# Patient Record
Sex: Female | Born: 1940 | Race: White | Hispanic: No | Smoking: Never smoker
Health system: Southern US, Community
[De-identification: ages and names within clinical notes are randomized; demographics above are authoritative.]

## PROBLEM LIST (undated history)

## (undated) DIAGNOSIS — K635 Polyp of colon: Secondary | ICD-10-CM

## (undated) DIAGNOSIS — I779 Disorder of arteries and arterioles, unspecified: Secondary | ICD-10-CM

## (undated) DIAGNOSIS — M5030 Other cervical disc degeneration, unspecified cervical region: Secondary | ICD-10-CM

## (undated) DIAGNOSIS — M51369 Other intervertebral disc degeneration, lumbar region without mention of lumbar back pain or lower extremity pain: Secondary | ICD-10-CM

## (undated) DIAGNOSIS — I5189 Other ill-defined heart diseases: Secondary | ICD-10-CM

## (undated) DIAGNOSIS — M1990 Unspecified osteoarthritis, unspecified site: Secondary | ICD-10-CM

## (undated) DIAGNOSIS — K429 Umbilical hernia without obstruction or gangrene: Secondary | ICD-10-CM

## (undated) DIAGNOSIS — I700 Atherosclerosis of aorta: Secondary | ICD-10-CM

## (undated) DIAGNOSIS — I350 Nonrheumatic aortic (valve) stenosis: Secondary | ICD-10-CM

## (undated) DIAGNOSIS — C44519 Basal cell carcinoma of skin of other part of trunk: Secondary | ICD-10-CM

## (undated) DIAGNOSIS — E039 Hypothyroidism, unspecified: Secondary | ICD-10-CM

## (undated) DIAGNOSIS — M8580 Other specified disorders of bone density and structure, unspecified site: Secondary | ICD-10-CM

## (undated) DIAGNOSIS — D649 Anemia, unspecified: Secondary | ICD-10-CM

## (undated) DIAGNOSIS — I517 Cardiomegaly: Secondary | ICD-10-CM

## (undated) DIAGNOSIS — Z9221 Personal history of antineoplastic chemotherapy: Secondary | ICD-10-CM

## (undated) DIAGNOSIS — I2510 Atherosclerotic heart disease of native coronary artery without angina pectoris: Secondary | ICD-10-CM

## (undated) DIAGNOSIS — M47816 Spondylosis without myelopathy or radiculopathy, lumbar region: Secondary | ICD-10-CM

## (undated) DIAGNOSIS — R600 Localized edema: Secondary | ICD-10-CM

## (undated) DIAGNOSIS — G629 Polyneuropathy, unspecified: Secondary | ICD-10-CM

## (undated) DIAGNOSIS — Z7982 Long term (current) use of aspirin: Secondary | ICD-10-CM

## (undated) DIAGNOSIS — I10 Essential (primary) hypertension: Secondary | ICD-10-CM

## (undated) DIAGNOSIS — Z95828 Presence of other vascular implants and grafts: Secondary | ICD-10-CM

## (undated) DIAGNOSIS — R112 Nausea with vomiting, unspecified: Secondary | ICD-10-CM

## (undated) DIAGNOSIS — Z950 Presence of cardiac pacemaker: Secondary | ICD-10-CM

## (undated) DIAGNOSIS — M47812 Spondylosis without myelopathy or radiculopathy, cervical region: Secondary | ICD-10-CM

## (undated) DIAGNOSIS — R011 Cardiac murmur, unspecified: Secondary | ICD-10-CM

## (undated) DIAGNOSIS — T451X5A Adverse effect of antineoplastic and immunosuppressive drugs, initial encounter: Secondary | ICD-10-CM

## (undated) DIAGNOSIS — Z9841 Cataract extraction status, right eye: Secondary | ICD-10-CM

## (undated) DIAGNOSIS — S42309A Unspecified fracture of shaft of humerus, unspecified arm, initial encounter for closed fracture: Secondary | ICD-10-CM

## (undated) DIAGNOSIS — Z9889 Other specified postprocedural states: Secondary | ICD-10-CM

## (undated) DIAGNOSIS — C801 Malignant (primary) neoplasm, unspecified: Secondary | ICD-10-CM

## (undated) DIAGNOSIS — M4802 Spinal stenosis, cervical region: Secondary | ICD-10-CM

## (undated) DIAGNOSIS — E0590 Thyrotoxicosis, unspecified without thyrotoxic crisis or storm: Secondary | ICD-10-CM

## (undated) DIAGNOSIS — C50919 Malignant neoplasm of unspecified site of unspecified female breast: Secondary | ICD-10-CM

## (undated) DIAGNOSIS — C4491 Basal cell carcinoma of skin, unspecified: Secondary | ICD-10-CM

## (undated) DIAGNOSIS — Z9842 Cataract extraction status, left eye: Secondary | ICD-10-CM

## (undated) DIAGNOSIS — Z923 Personal history of irradiation: Secondary | ICD-10-CM

## (undated) HISTORY — PX: INSERT / REPLACE / REMOVE PACEMAKER: SUR710

## (undated) HISTORY — DX: Adverse effect of antineoplastic and immunosuppressive drugs, initial encounter: R11.2

## (undated) HISTORY — DX: Other specified disorders of bone density and structure, unspecified site: M85.80

## (undated) HISTORY — PX: BREAST EXCISIONAL BIOPSY: SUR124

## (undated) HISTORY — DX: Presence of other vascular implants and grafts: Z95.828

## (undated) HISTORY — DX: Basal cell carcinoma of skin, unspecified: C44.91

## (undated) HISTORY — PX: BREAST SURGERY: SHX581

## (undated) HISTORY — DX: Adverse effect of antineoplastic and immunosuppressive drugs, initial encounter: T45.1X5A

## (undated) HISTORY — DX: Malignant (primary) neoplasm, unspecified: C80.1

## (undated) HISTORY — PX: CARDIAC CATHETERIZATION: SHX172

## (undated) HISTORY — PX: CHOLECYSTECTOMY: SHX55

## (undated) HISTORY — PX: BACK SURGERY: SHX140

## (undated) HISTORY — PX: EYE SURGERY: SHX253

## (undated) HISTORY — DX: Essential (primary) hypertension: I10

## (undated) HISTORY — DX: Nausea with vomiting, unspecified: R11.2

## (undated) HISTORY — PX: CARDIAC VALVE REPLACEMENT: SHX585

## (undated) HISTORY — DX: Presence of cardiac pacemaker: Z95.0

## (undated) HISTORY — PX: HEMORRHOID SURGERY: SHX153

## (undated) HISTORY — PX: CATARACT EXTRACTION W/ INTRAOCULAR LENS  IMPLANT, BILATERAL: SHX1307

---
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
## 1999-12-05 ENCOUNTER — Other Ambulatory Visit: Admission: RE | Admit: 1999-12-05 | Discharge: 1999-12-05 | Payer: Self-pay | Admitting: Podiatry

## 2004-02-02 ENCOUNTER — Emergency Department (HOSPITAL_COMMUNITY): Admission: EM | Admit: 2004-02-02 | Discharge: 2004-02-02 | Payer: Self-pay | Admitting: Family Medicine

## 2008-06-18 DIAGNOSIS — C50919 Malignant neoplasm of unspecified site of unspecified female breast: Secondary | ICD-10-CM

## 2008-06-18 DIAGNOSIS — Z9221 Personal history of antineoplastic chemotherapy: Secondary | ICD-10-CM

## 2008-06-18 DIAGNOSIS — Z923 Personal history of irradiation: Secondary | ICD-10-CM

## 2008-06-18 HISTORY — PX: MASTECTOMY, PARTIAL: SHX709

## 2008-06-18 HISTORY — DX: Malignant neoplasm of unspecified site of unspecified female breast: C50.919

## 2008-06-18 HISTORY — DX: Personal history of irradiation: Z92.3

## 2008-06-18 HISTORY — DX: Personal history of antineoplastic chemotherapy: Z92.21

## 2009-03-10 ENCOUNTER — Ambulatory Visit: Payer: Self-pay | Admitting: Surgery

## 2009-03-10 IMAGING — US US  BREAST BX W/ LOC DEV 1ST LESION IMG BX SPEC US GUIDE*R*
1 series · 17 of 25 positions shown · non-contrast
Comparison: No comparison.

REASON FOR EXAM: rt breast mass
COMMENTS:

PROCEDURE:     US  - US GUIDED BIOPSY BREAST RIGHT  - [DATE]  [DATE]
RESULT:     Ultrasound Indication: Spiculated right breast mass.

[Series 1: us breast bx w/ loc dev 1st lesion img bx spec us  · 17 of 26 slices shown]
[im 1/26]
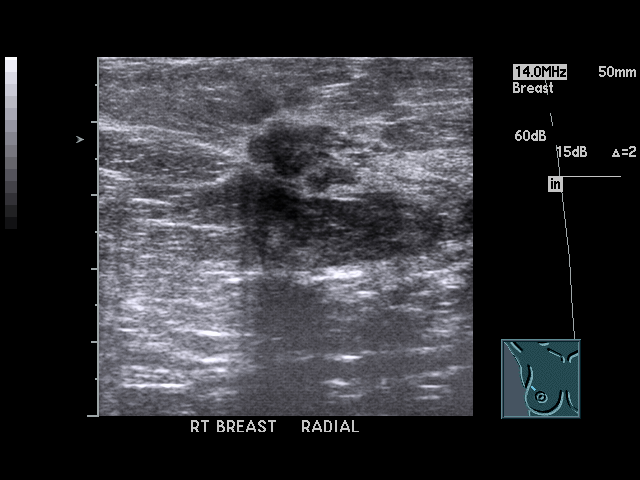
[im 3/26]
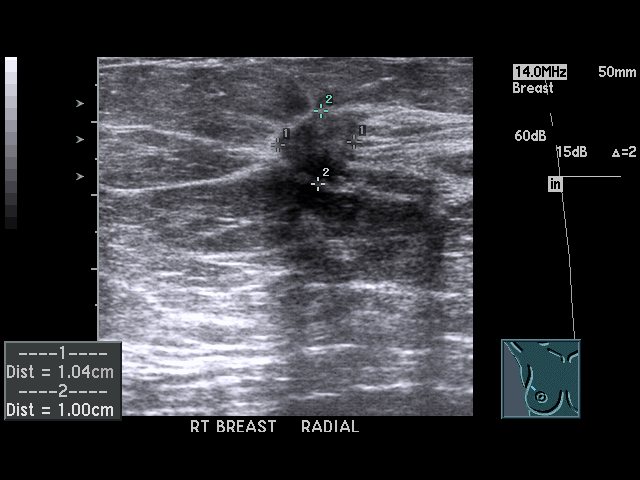
[im 4/26]
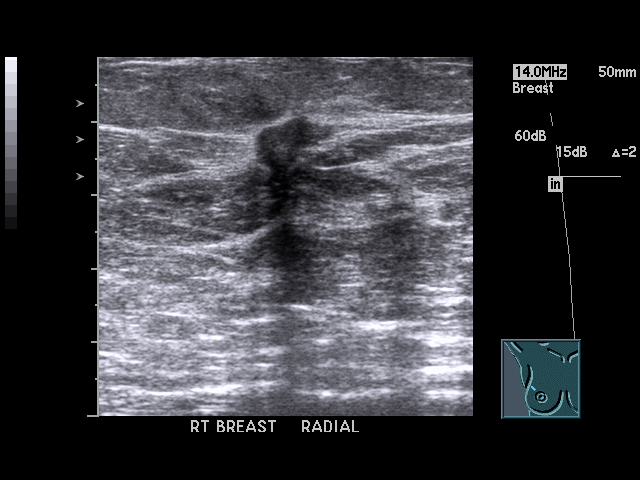
[im 6/26]
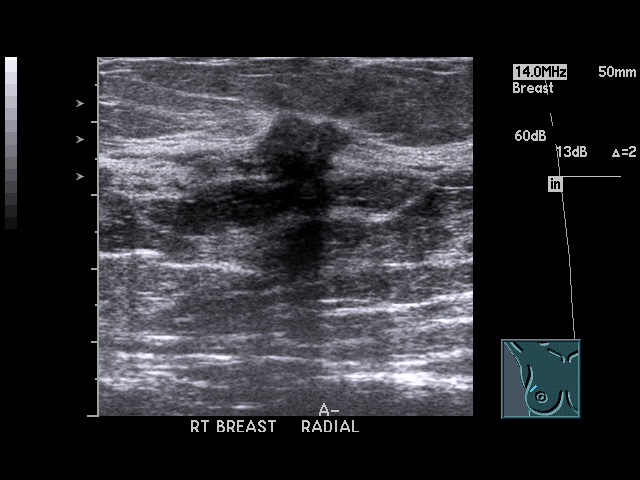
[im 7/26]
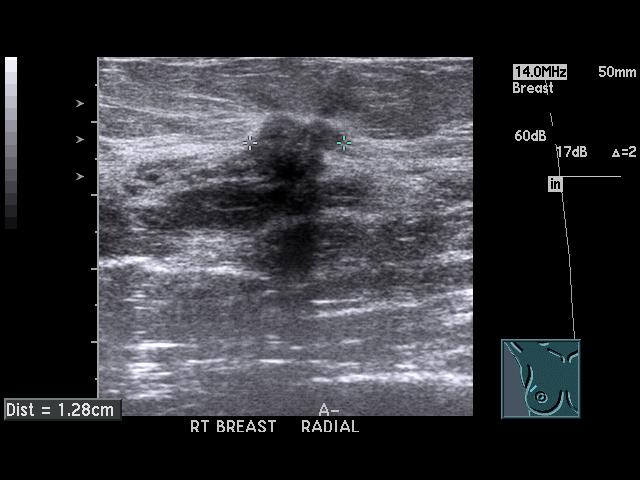
[im 9/26]
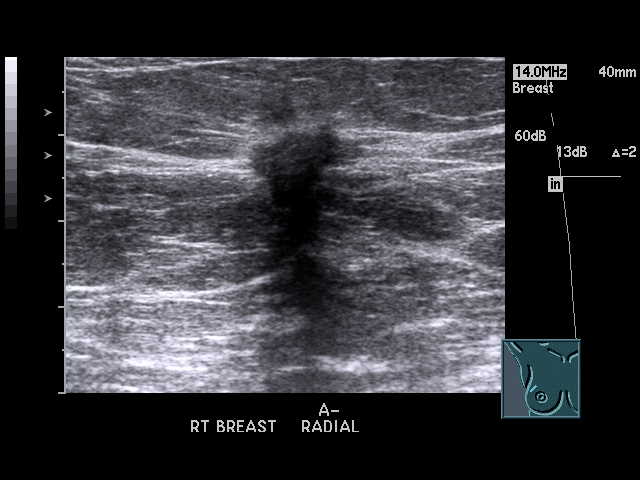
[im 10/26]
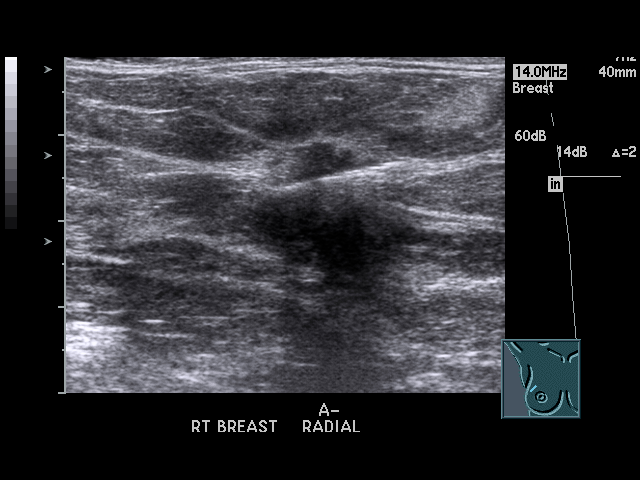
[im 12/26]
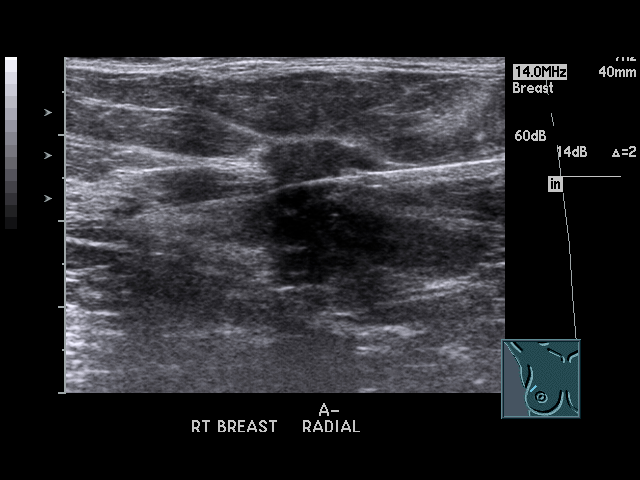
[im 13/26]
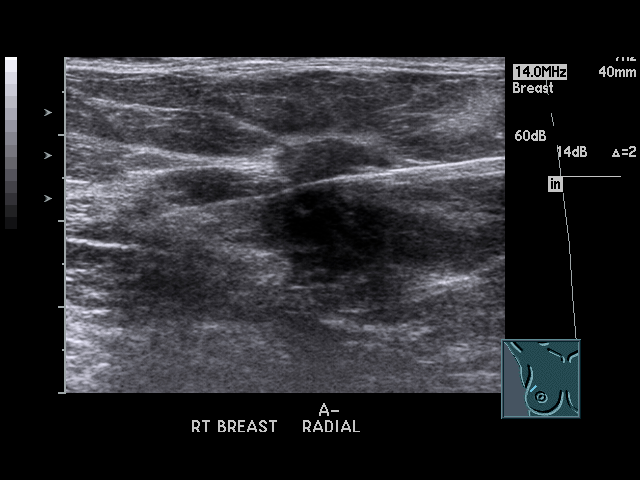
[im 14/26]
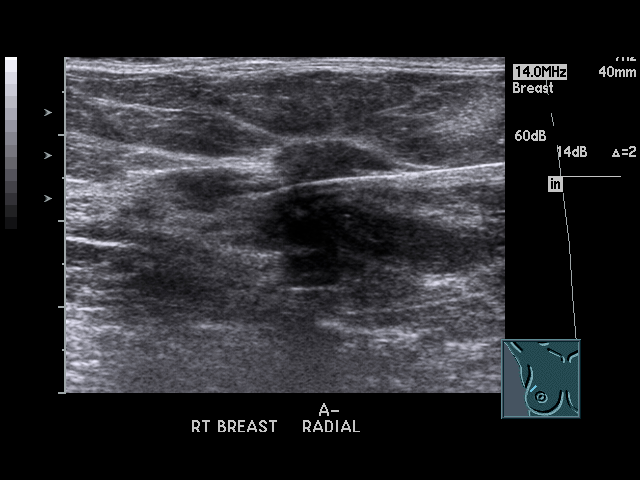
[im 16/26]
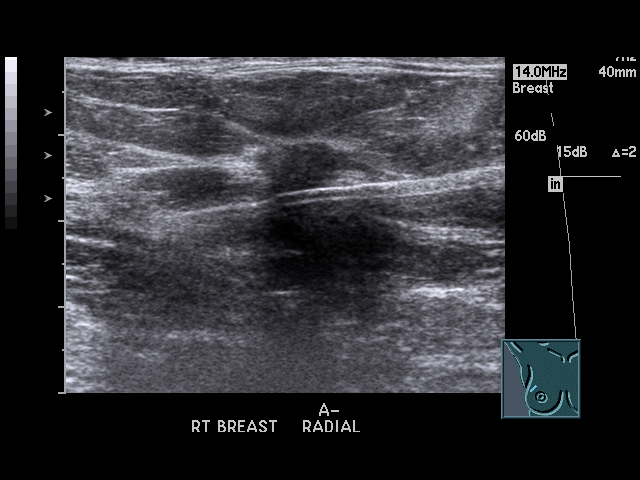
[im 17/26]
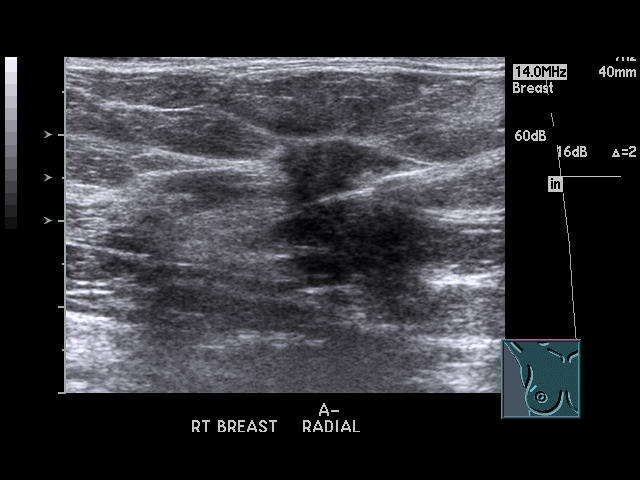
[im 19/26]
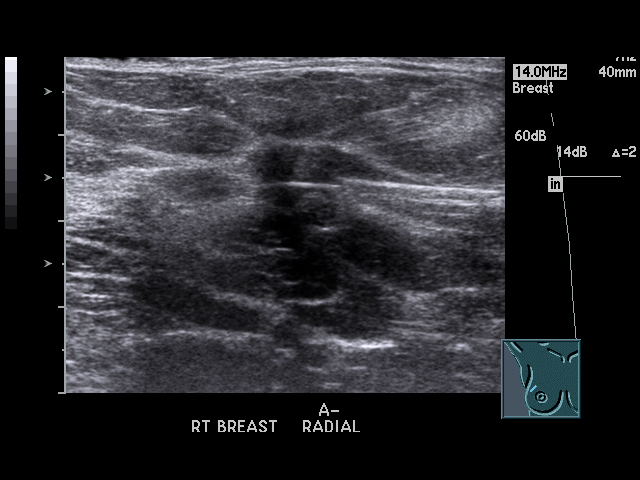
[im 20/26]
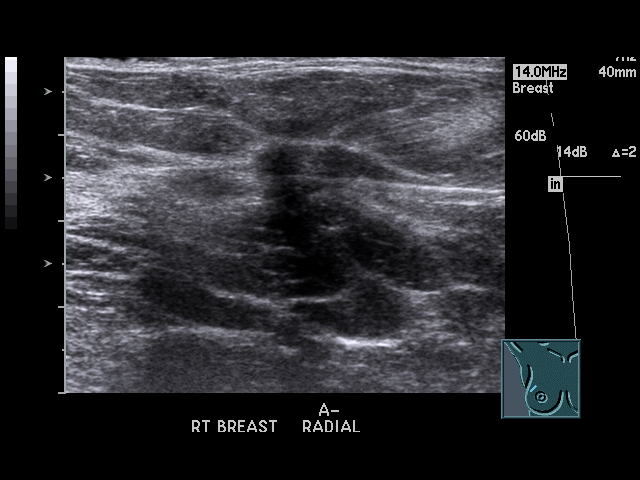
[im 22/26]
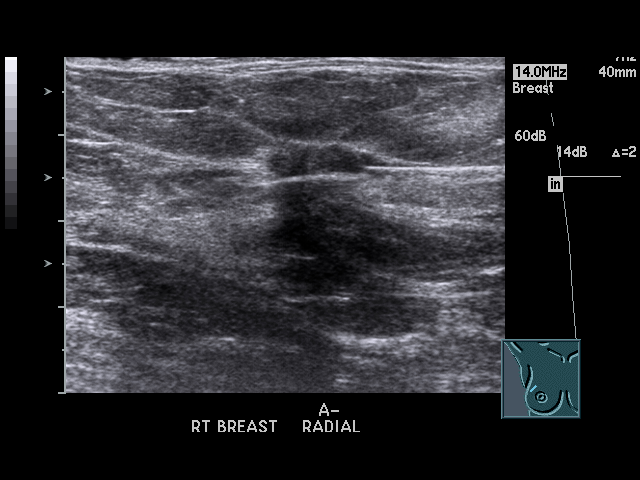
[im 23/26]
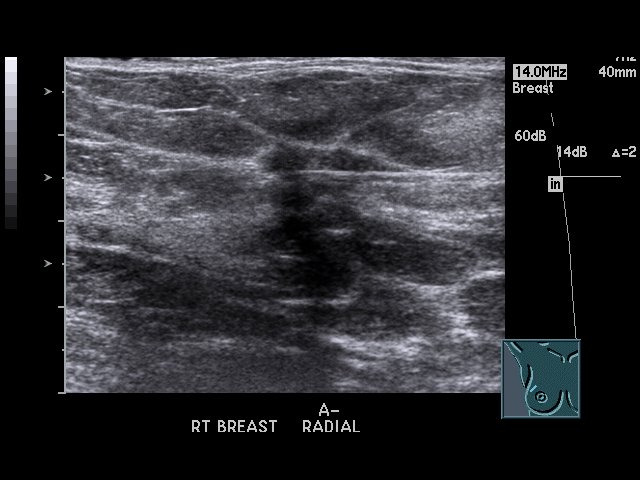
[im 26/26]
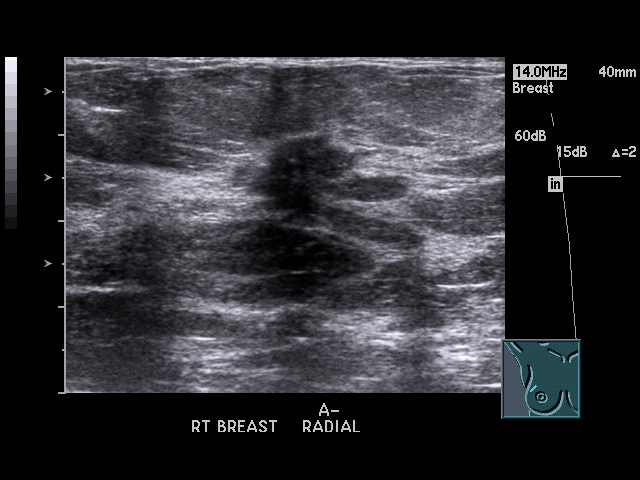

[17 of 25 positions shown; findings below may reference images not displayed]

Consent: After the procedure and its risks including infection, bleeding,
and allergy were discussed with the patient and all questions answered,
informed consent was signed.

Procedure: The patient was placed supine on the ultrasound with the right
arm in ABER position. A "timeout" was performed to verify the correct
patient and correct procedure. Initial ultrasound of the right breast mass
demonstrated a spiculated hypoechoic mass at the [DATE] position with
posterior shadowing. The patient was then prepped and draped in the usual
sterile fashion. Realtime ultrasound was used to localize the mass and a
biopsy path was selected. The skin was marked and 1% lidocaine was
infiltrated into skin and soft tissues along the biopsy tract up to the mass
for local anesthesia.

A small skin incision with a #11 blade was made at the skin entry site. A 17
gauge coaxial needle was advanced to the margin of the mass. Subsequently, 6
core biopsy specimens were obtained using an Achieve 18 gauge needle in
semiautomatic mode. Needle passes into the mass were observed under realtime
and recorded. Seven core biopsy samples were obtained and submitted to
pathology for evaluation. The needle was removed, hemostasis achieved and a
bandaid applied to the skin incision. The patient tolerated the procedure
well without immediate complication and was discharged in good condition
after 15 min observation.
IMPRESSION: Successful core needle biopsy of a right breast mass.

## 2009-03-18 ENCOUNTER — Ambulatory Visit: Payer: Self-pay | Admitting: Internal Medicine

## 2009-03-18 ENCOUNTER — Ambulatory Visit: Payer: Self-pay | Admitting: Surgery

## 2009-03-18 HISTORY — PX: BREAST EXCISIONAL BIOPSY: SUR124

## 2009-03-18 IMAGING — CR DG CHEST 2V
1 series · 2 of 2 positions shown · non-contrast
Comparison: none

REASON FOR EXAM: [DATE]-----HTN
COMMENTS:

PROCEDURE:     DXR - DXR CHEST PA (OR AP) AND LATERAL  - [DATE]  [DATE]
RESULT:     The lungs are clear. The cardiac silhouette and visualized bony
skeleton are unremarkable.

[Series 1: view not recorded · 0.17mm/px · 2 of 2 slices shown]
[im 1/2]
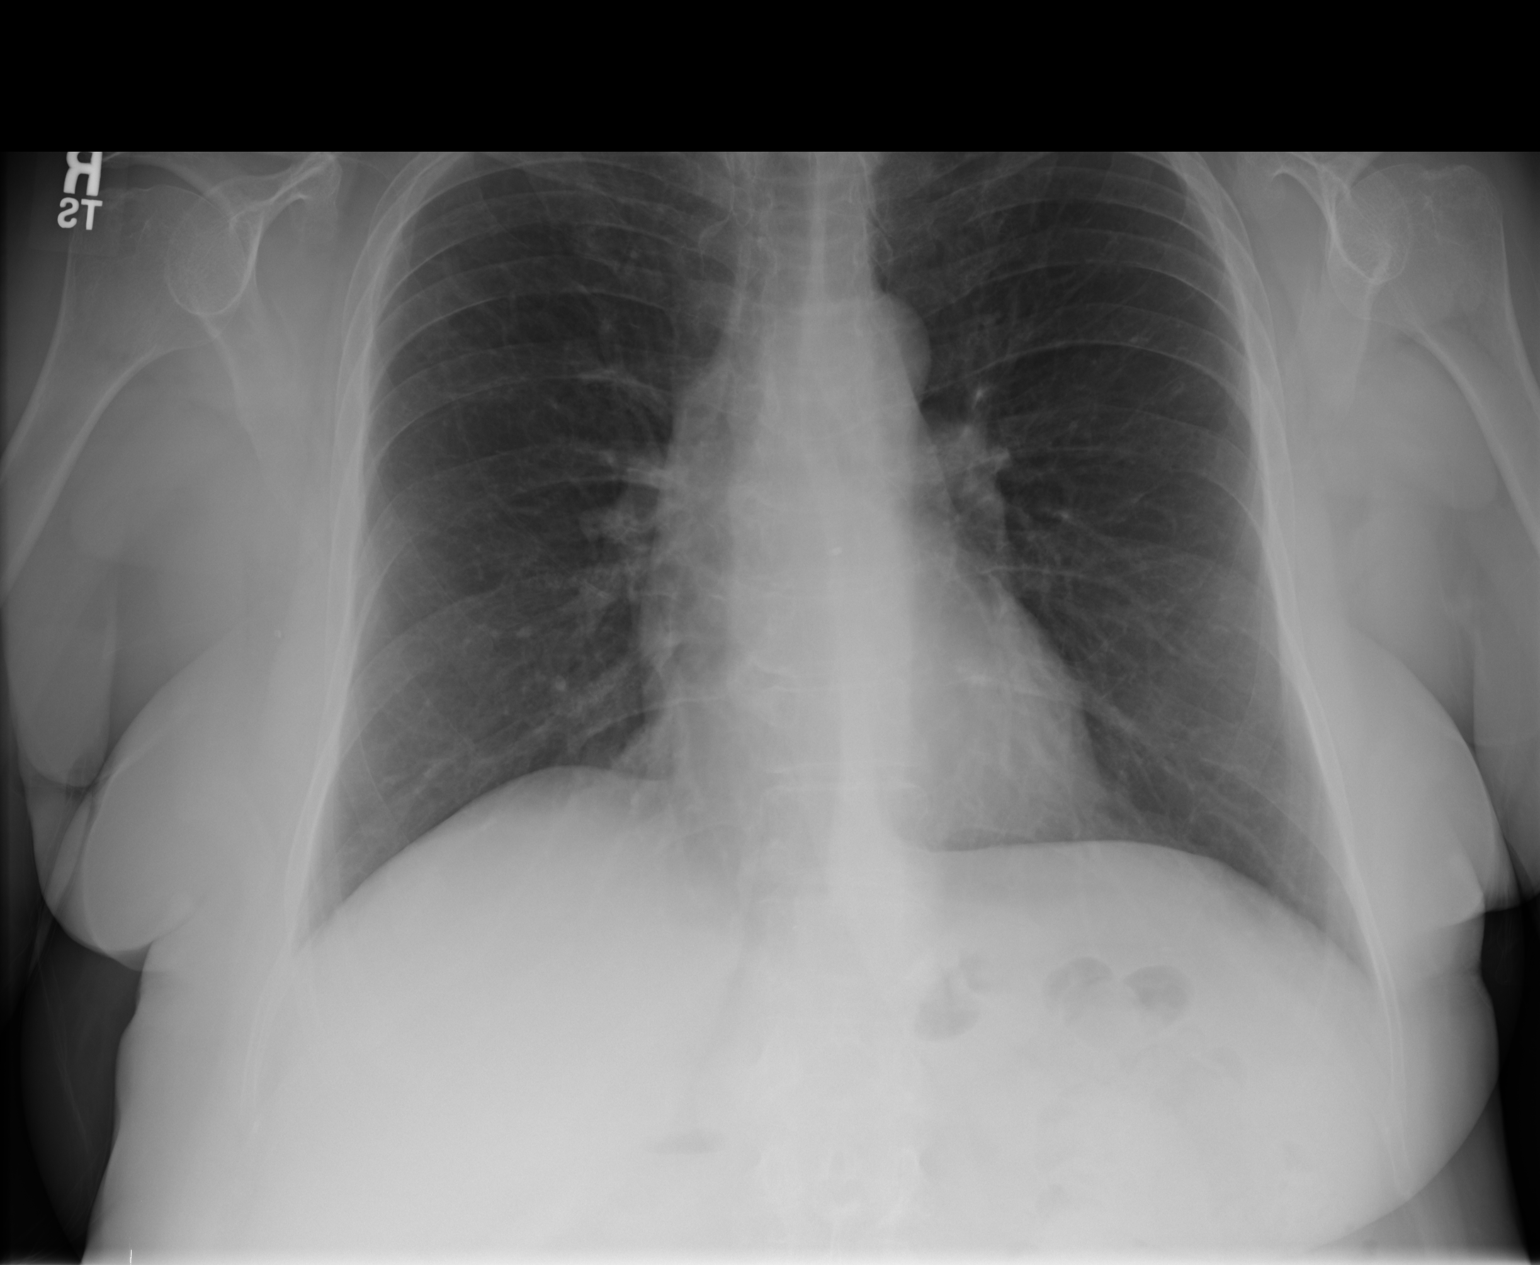
[im 2/2]
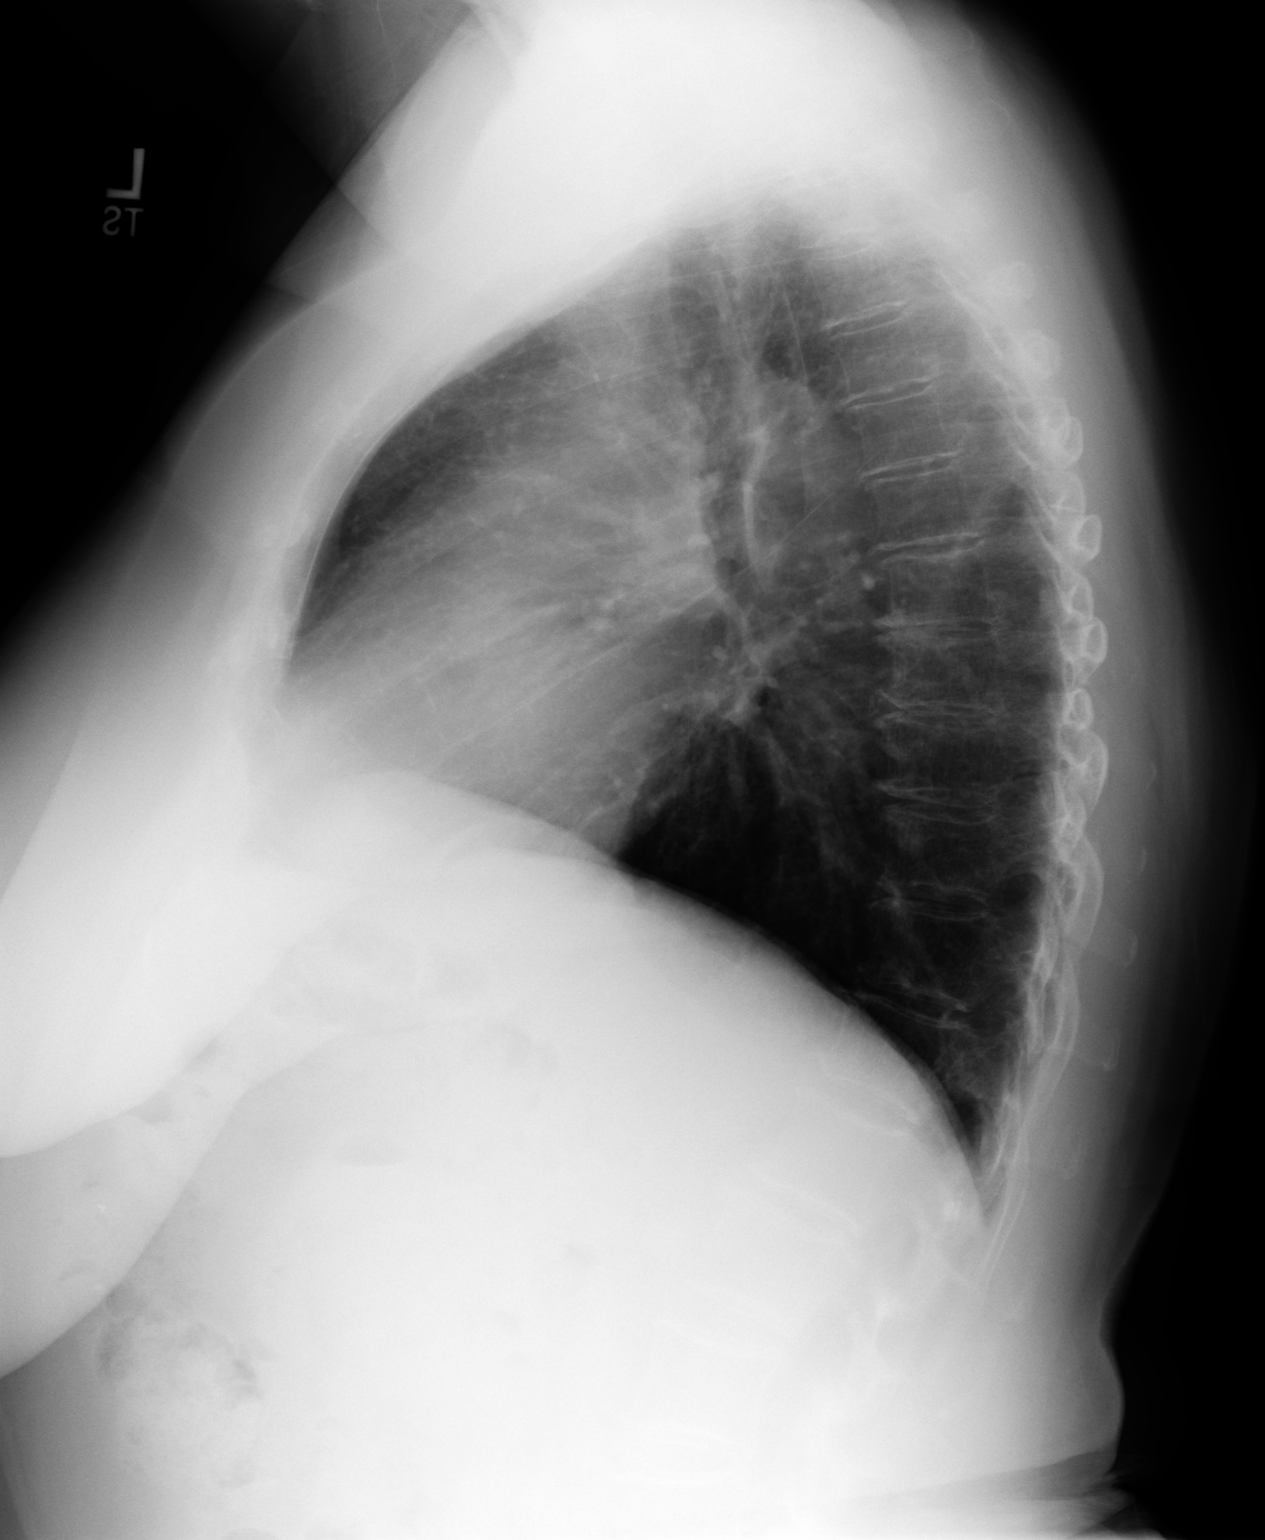

[2 of 2 positions shown; findings below may reference images not displayed]

IMPRESSION: 1. Chest radiograph without evidence of acute cardiopulmonary disease.

## 2009-03-23 ENCOUNTER — Ambulatory Visit: Payer: Self-pay | Admitting: Surgery

## 2009-03-23 IMAGING — NM NM SENTINAL NODE INJECTION (BREAST) - NO REPORT
2 series · 18 of 18 positions shown · non-contrast
Comparison: none

REASON FOR EXAM: R PARTIAL MASTECTOMY SEO BX   NEEDLE LOC  POSS AXILLARY
DISSECTION  [DATE]   ...
COMMENTS:

PROCEDURE:     NM  - NM SENTINEL NODE  BREAST  - [DATE] [DATE]
RESULT:
HISTORY: Breast cancer.
PROCEDURE AND FINDINGS: Following discussion of this procedure with the
patient, the right breast was sterilely prepped and draped. Following local
anesthesia with 1% lidocaine, 1.03 mCi of technetium sulfur colloid was
injected into the subcutaneous space in the right periareolar region.

[Series 1000: sent node breast static · 2.40mm/px · 6 acquisitions, 12 frames shown]
[im 1/6]
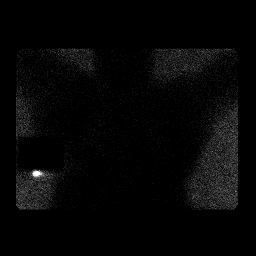
[im 1/6]
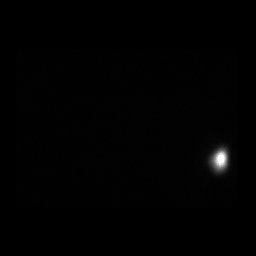
[im 2/6]
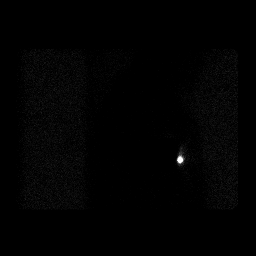
[im 2/6]
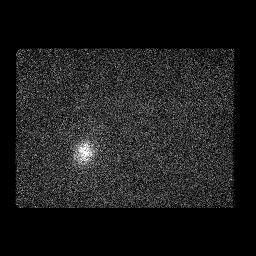
[im 3/6]
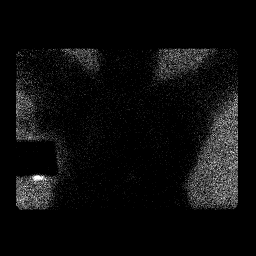
[im 3/6]
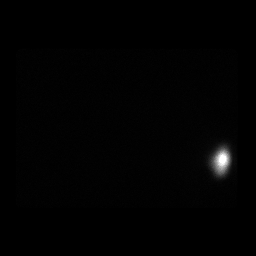
[im 4/6]
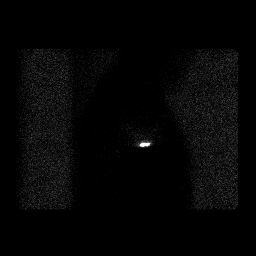
[im 4/6]
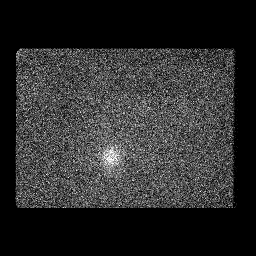
[im 5/6]
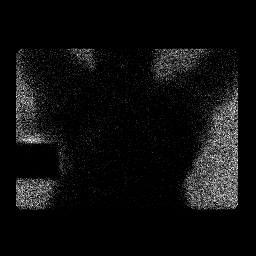
[im 5/6]
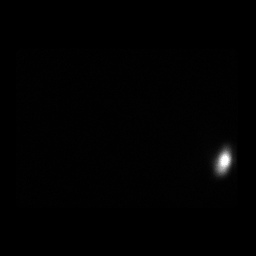
[im 6/6  full-range]
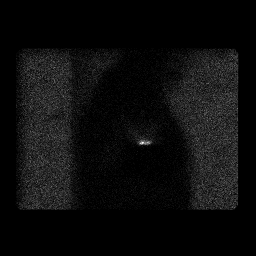
[im 6/6  full-range]
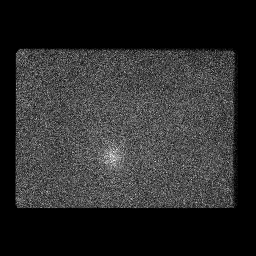

[Series 1000: sent node breast-dynamic · 4.80mm/px · 6 of 60 frames shown]
[frame 6/60  full-range]
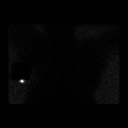
[frame 16/60  full-range]
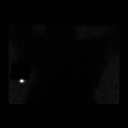
[frame 26/60  full-range]
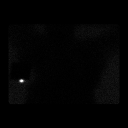
[frame 36/60]
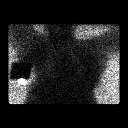
[frame 46/60]
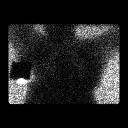
[frame 56/60]
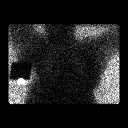

[18 of 18 positions shown; findings below may reference images not displayed]

IMPRESSION: Successful injection for sentinel node study. No definite
sentinel node noted up to 15 minutes.

## 2009-03-30 ENCOUNTER — Emergency Department: Payer: Self-pay | Admitting: Surgery

## 2009-04-06 ENCOUNTER — Ambulatory Visit: Payer: Self-pay | Admitting: Internal Medicine

## 2009-04-18 ENCOUNTER — Ambulatory Visit: Payer: Self-pay | Admitting: Internal Medicine

## 2009-05-06 ENCOUNTER — Ambulatory Visit: Payer: Self-pay | Admitting: Surgery

## 2009-05-06 IMAGING — CR DG CHEST 1V PORT
1 series · 1 of 1 positions shown · non-contrast
Comparison: none

REASON FOR EXAM: check catheter placement
COMMENTS:

[view not recorded]
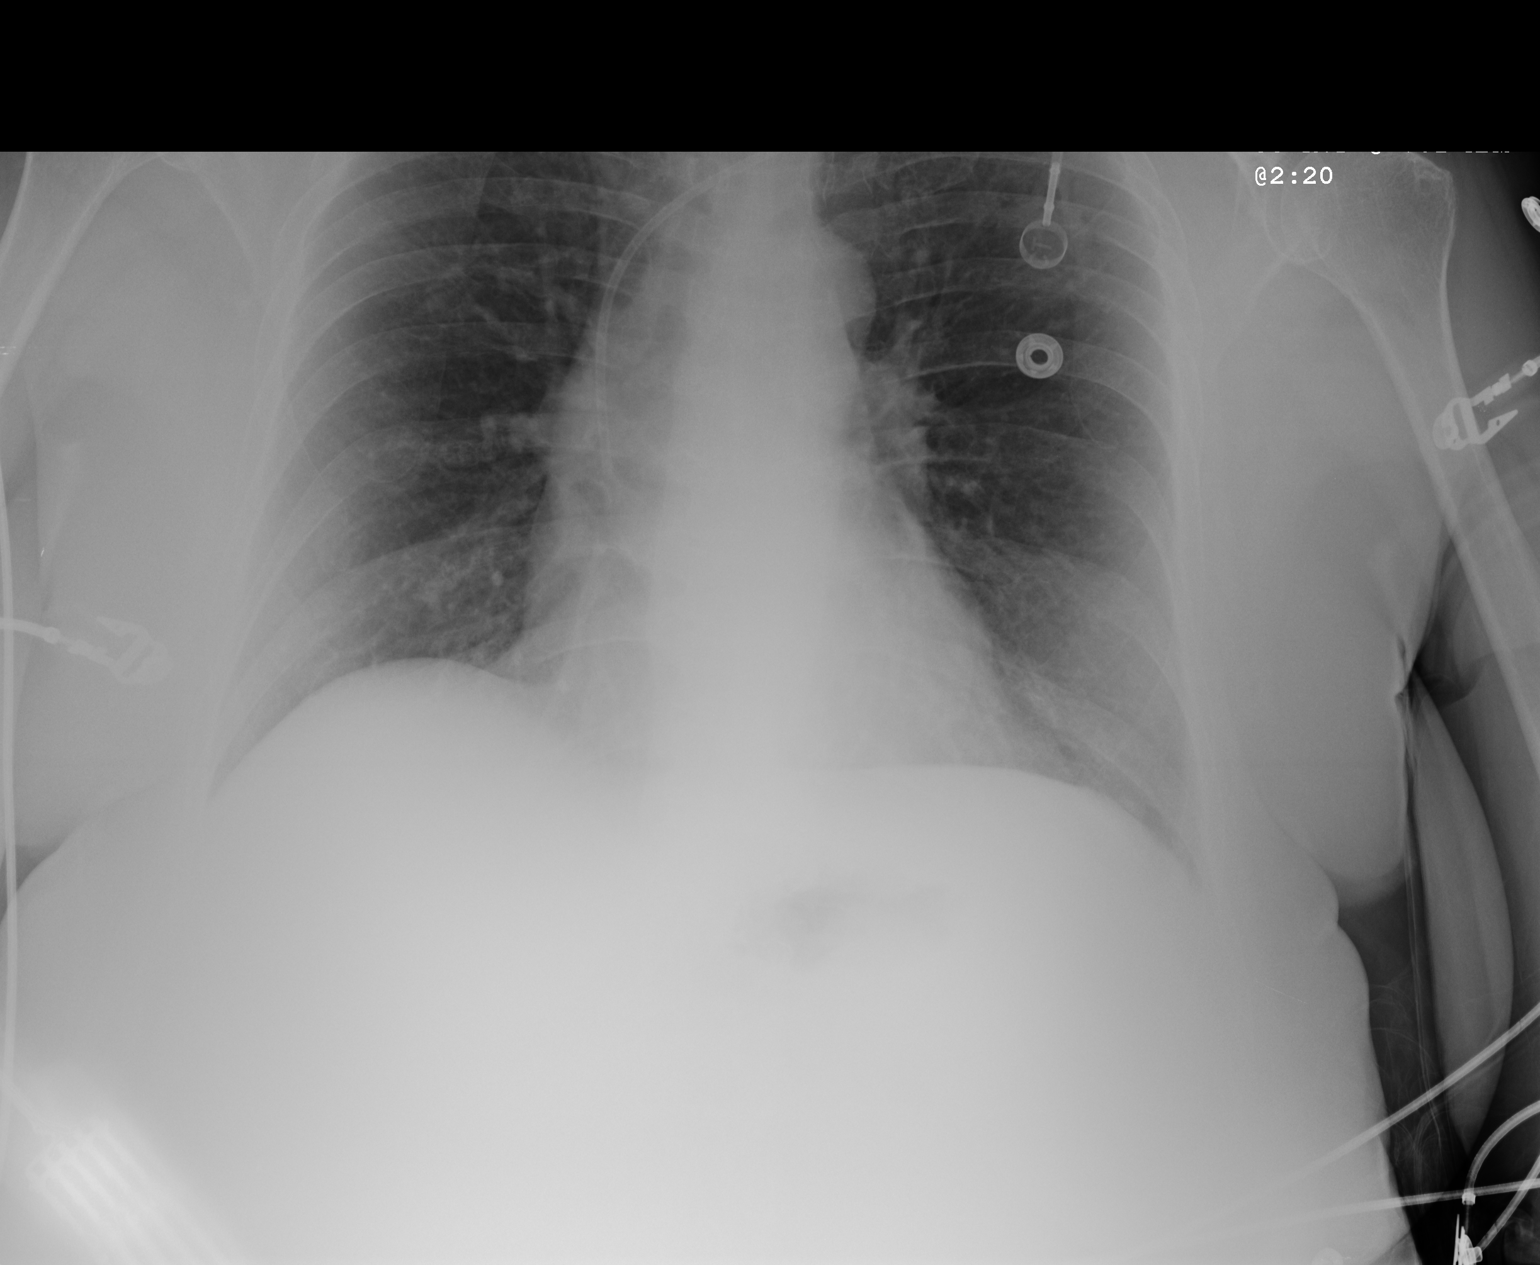

[1 of 1 positions shown; findings below may reference images not displayed]

PROCEDURE:     DXR - DXR PORTABLE CHEST SINGLE VIEW  - [DATE]  [DATE]

RESULT:     Comparison is made to the study [DATE].

The lungs are reasonably well inflated. The patient has undergone placement
of a Port-A-Cath appliance via the left subclavian approach. The tip of the
catheter lies in the region of the midportion of the SVC. There is no
evidence of a post procedure pneumothorax or hemothorax. The cardiac
silhouette is normal in size.
IMPRESSION: I see no post procedure complication following placement of
a Port-A-Cath appliance on the left.

## 2009-05-10 ENCOUNTER — Ambulatory Visit: Payer: Self-pay | Admitting: Internal Medicine

## 2009-05-18 ENCOUNTER — Ambulatory Visit: Payer: Self-pay | Admitting: Internal Medicine

## 2009-06-18 ENCOUNTER — Ambulatory Visit: Payer: Self-pay | Admitting: Internal Medicine

## 2009-06-18 DIAGNOSIS — C50911 Malignant neoplasm of unspecified site of right female breast: Secondary | ICD-10-CM

## 2009-06-18 HISTORY — DX: Malignant neoplasm of unspecified site of right female breast: C50.911

## 2009-07-19 ENCOUNTER — Ambulatory Visit: Payer: Self-pay | Admitting: Internal Medicine

## 2009-08-16 ENCOUNTER — Ambulatory Visit: Payer: Self-pay | Admitting: Internal Medicine

## 2009-09-16 ENCOUNTER — Ambulatory Visit: Payer: Self-pay | Admitting: Internal Medicine

## 2009-10-16 ENCOUNTER — Ambulatory Visit: Payer: Self-pay | Admitting: Internal Medicine

## 2009-11-16 ENCOUNTER — Ambulatory Visit: Payer: Self-pay | Admitting: Internal Medicine

## 2009-12-16 ENCOUNTER — Ambulatory Visit: Payer: Self-pay | Admitting: Internal Medicine

## 2010-03-18 ENCOUNTER — Ambulatory Visit: Payer: Self-pay | Admitting: Internal Medicine

## 2010-03-21 ENCOUNTER — Ambulatory Visit: Payer: Self-pay | Admitting: Internal Medicine

## 2010-03-22 LAB — CANCER ANTIGEN 27.29: CA 27.29: 19.1 U/mL (ref 0.0–38.6)

## 2010-04-18 ENCOUNTER — Ambulatory Visit: Payer: Self-pay | Admitting: Internal Medicine

## 2010-05-18 ENCOUNTER — Ambulatory Visit: Payer: Self-pay | Admitting: Internal Medicine

## 2010-07-25 ENCOUNTER — Ambulatory Visit: Payer: Self-pay | Admitting: Internal Medicine

## 2010-07-26 LAB — CANCER ANTIGEN 27.29: CA 27.29: 26.2 U/mL (ref 0.0–38.6)

## 2010-08-17 ENCOUNTER — Ambulatory Visit: Payer: Self-pay | Admitting: Internal Medicine

## 2010-10-18 ENCOUNTER — Ambulatory Visit: Payer: Self-pay | Admitting: Internal Medicine

## 2010-10-26 ENCOUNTER — Ambulatory Visit: Payer: Self-pay | Admitting: Internal Medicine

## 2010-11-17 ENCOUNTER — Ambulatory Visit: Payer: Self-pay | Admitting: Internal Medicine

## 2010-12-12 ENCOUNTER — Ambulatory Visit: Payer: Self-pay | Admitting: Surgery

## 2010-12-15 LAB — CANCER ANTIGEN 27.29: CA 27.29: 20.5 U/mL (ref 0.0–38.6)

## 2010-12-17 ENCOUNTER — Ambulatory Visit: Payer: Self-pay | Admitting: Internal Medicine

## 2011-04-17 ENCOUNTER — Ambulatory Visit: Payer: Self-pay | Admitting: Internal Medicine

## 2011-04-18 LAB — CANCER ANTIGEN 27.29: CA 27.29: 29.7 U/mL (ref 0.0–38.6)

## 2011-04-19 ENCOUNTER — Ambulatory Visit: Payer: Self-pay | Admitting: Internal Medicine

## 2011-06-14 ENCOUNTER — Ambulatory Visit: Payer: Self-pay | Admitting: Surgery

## 2011-07-12 DIAGNOSIS — I10 Essential (primary) hypertension: Secondary | ICD-10-CM | POA: Diagnosis not present

## 2011-07-12 DIAGNOSIS — I359 Nonrheumatic aortic valve disorder, unspecified: Secondary | ICD-10-CM | POA: Diagnosis not present

## 2011-07-12 DIAGNOSIS — E782 Mixed hyperlipidemia: Secondary | ICD-10-CM | POA: Diagnosis not present

## 2011-10-01 DIAGNOSIS — C50919 Malignant neoplasm of unspecified site of unspecified female breast: Secondary | ICD-10-CM | POA: Diagnosis not present

## 2011-10-16 ENCOUNTER — Ambulatory Visit: Payer: Self-pay | Admitting: Oncology

## 2011-10-16 DIAGNOSIS — Z79899 Other long term (current) drug therapy: Secondary | ICD-10-CM | POA: Diagnosis not present

## 2011-10-16 DIAGNOSIS — E785 Hyperlipidemia, unspecified: Secondary | ICD-10-CM | POA: Diagnosis not present

## 2011-10-16 DIAGNOSIS — Z79811 Long term (current) use of aromatase inhibitors: Secondary | ICD-10-CM | POA: Diagnosis not present

## 2011-10-16 DIAGNOSIS — M810 Age-related osteoporosis without current pathological fracture: Secondary | ICD-10-CM | POA: Diagnosis not present

## 2011-10-16 DIAGNOSIS — Z170 Estrogen receptor positive status [ER+]: Secondary | ICD-10-CM | POA: Diagnosis not present

## 2011-10-16 DIAGNOSIS — I10 Essential (primary) hypertension: Secondary | ICD-10-CM | POA: Diagnosis not present

## 2011-10-16 DIAGNOSIS — Z9010 Acquired absence of unspecified breast and nipple: Secondary | ICD-10-CM | POA: Diagnosis not present

## 2011-10-16 DIAGNOSIS — C50919 Malignant neoplasm of unspecified site of unspecified female breast: Secondary | ICD-10-CM | POA: Diagnosis not present

## 2011-10-16 LAB — CBC CANCER CENTER
Basophil #: 0.1 x10 3/mm (ref 0.0–0.1)
Basophil %: 0.8 %
Eosinophil #: 0.2 x10 3/mm (ref 0.0–0.7)
Eosinophil %: 3.6 %
HCT: 39 % (ref 35.0–47.0)
HGB: 13.2 g/dL (ref 12.0–16.0)
Lymphocyte #: 1.6 x10 3/mm (ref 1.0–3.6)
Lymphocyte %: 24.9 %
MCH: 33.1 pg (ref 26.0–34.0)
MCHC: 33.9 g/dL (ref 32.0–36.0)
MCV: 98 fL (ref 80–100)
Monocyte #: 0.6 x10 3/mm (ref 0.2–0.9)
Monocyte %: 8.5 %
Neutrophil #: 4.1 x10 3/mm (ref 1.4–6.5)
Neutrophil %: 62.2 %
Platelet: 197 x10 3/mm (ref 150–440)
RBC: 3.99 10*6/uL (ref 3.80–5.20)
RDW: 12.8 % (ref 11.5–14.5)
WBC: 6.6 x10 3/mm (ref 3.6–11.0)

## 2011-10-16 LAB — COMPREHENSIVE METABOLIC PANEL
Albumin: 3.9 g/dL (ref 3.4–5.0)
Alkaline Phosphatase: 90 U/L (ref 50–136)
Anion Gap: 8 (ref 7–16)
BUN: 13 mg/dL (ref 7–18)
Bilirubin,Total: 0.5 mg/dL (ref 0.2–1.0)
Calcium, Total: 9.2 mg/dL (ref 8.5–10.1)
Chloride: 107 mmol/L (ref 98–107)
Co2: 26 mmol/L (ref 21–32)
Creatinine: 0.61 mg/dL (ref 0.60–1.30)
EGFR (African American): >60
EGFR (Non-African Amer.): >60
Glucose: 111 mg/dL — ABNORMAL HIGH (ref 65–99)
Osmolality: 282 (ref 275–301)
Potassium: 3.8 mmol/L (ref 3.5–5.1)
SGOT(AST): 21 U/L (ref 15–37)
SGPT (ALT): 22 U/L
Sodium: 141 mmol/L (ref 136–145)
Total Protein: 7.3 g/dL (ref 6.4–8.2)

## 2011-10-17 ENCOUNTER — Ambulatory Visit: Payer: Self-pay | Admitting: Oncology

## 2011-10-17 DIAGNOSIS — C50919 Malignant neoplasm of unspecified site of unspecified female breast: Secondary | ICD-10-CM | POA: Diagnosis not present

## 2011-10-17 DIAGNOSIS — Z79811 Long term (current) use of aromatase inhibitors: Secondary | ICD-10-CM | POA: Diagnosis not present

## 2011-10-17 DIAGNOSIS — Z13820 Encounter for screening for osteoporosis: Secondary | ICD-10-CM | POA: Diagnosis not present

## 2011-10-17 DIAGNOSIS — M810 Age-related osteoporosis without current pathological fracture: Secondary | ICD-10-CM | POA: Diagnosis not present

## 2011-10-17 LAB — CANCER ANTIGEN 27.29: CA 27.29: 16.1 U/mL (ref 0.0–38.6)

## 2011-11-06 DIAGNOSIS — Z13820 Encounter for screening for osteoporosis: Secondary | ICD-10-CM | POA: Diagnosis not present

## 2011-11-17 ENCOUNTER — Ambulatory Visit: Payer: Self-pay | Admitting: Oncology

## 2011-12-17 DIAGNOSIS — L918 Other hypertrophic disorders of the skin: Secondary | ICD-10-CM | POA: Diagnosis not present

## 2011-12-17 DIAGNOSIS — D485 Neoplasm of uncertain behavior of skin: Secondary | ICD-10-CM | POA: Diagnosis not present

## 2011-12-17 DIAGNOSIS — L570 Actinic keratosis: Secondary | ICD-10-CM | POA: Diagnosis not present

## 2011-12-17 DIAGNOSIS — L908 Other atrophic disorders of skin: Secondary | ICD-10-CM | POA: Diagnosis not present

## 2011-12-18 ENCOUNTER — Ambulatory Visit: Payer: Self-pay | Admitting: Surgery

## 2011-12-18 DIAGNOSIS — R928 Other abnormal and inconclusive findings on diagnostic imaging of breast: Secondary | ICD-10-CM | POA: Diagnosis not present

## 2011-12-18 DIAGNOSIS — Z853 Personal history of malignant neoplasm of breast: Secondary | ICD-10-CM | POA: Diagnosis not present

## 2012-02-15 ENCOUNTER — Ambulatory Visit: Payer: Self-pay | Admitting: Oncology

## 2012-02-15 DIAGNOSIS — C50919 Malignant neoplasm of unspecified site of unspecified female breast: Secondary | ICD-10-CM | POA: Diagnosis not present

## 2012-02-15 DIAGNOSIS — Z79899 Other long term (current) drug therapy: Secondary | ICD-10-CM | POA: Diagnosis not present

## 2012-02-15 DIAGNOSIS — Z7982 Long term (current) use of aspirin: Secondary | ICD-10-CM | POA: Diagnosis not present

## 2012-02-15 DIAGNOSIS — E785 Hyperlipidemia, unspecified: Secondary | ICD-10-CM | POA: Diagnosis not present

## 2012-02-15 DIAGNOSIS — I10 Essential (primary) hypertension: Secondary | ICD-10-CM | POA: Diagnosis not present

## 2012-02-15 DIAGNOSIS — Z79811 Long term (current) use of aromatase inhibitors: Secondary | ICD-10-CM | POA: Diagnosis not present

## 2012-02-15 DIAGNOSIS — Z9010 Acquired absence of unspecified breast and nipple: Secondary | ICD-10-CM | POA: Diagnosis not present

## 2012-02-15 DIAGNOSIS — M810 Age-related osteoporosis without current pathological fracture: Secondary | ICD-10-CM | POA: Diagnosis not present

## 2012-02-15 DIAGNOSIS — Z170 Estrogen receptor positive status [ER+]: Secondary | ICD-10-CM | POA: Diagnosis not present

## 2012-02-17 ENCOUNTER — Ambulatory Visit: Payer: Self-pay | Admitting: Oncology

## 2012-02-19 LAB — CANCER ANTIGEN 27.29: CA 27.29: 20.9 U/mL (ref 0.0–38.6)

## 2012-03-10 DIAGNOSIS — M899 Disorder of bone, unspecified: Secondary | ICD-10-CM | POA: Diagnosis not present

## 2012-03-10 DIAGNOSIS — Z1289 Encounter for screening for malignant neoplasm of other sites: Secondary | ICD-10-CM | POA: Diagnosis not present

## 2012-03-10 DIAGNOSIS — I10 Essential (primary) hypertension: Secondary | ICD-10-CM | POA: Diagnosis not present

## 2012-03-10 DIAGNOSIS — Z0000 Encounter for general adult medical examination without abnormal findings: Secondary | ICD-10-CM | POA: Diagnosis not present

## 2012-03-10 DIAGNOSIS — Z23 Encounter for immunization: Secondary | ICD-10-CM | POA: Diagnosis not present

## 2012-03-24 DIAGNOSIS — C50919 Malignant neoplasm of unspecified site of unspecified female breast: Secondary | ICD-10-CM | POA: Diagnosis not present

## 2012-04-08 DIAGNOSIS — H2510 Age-related nuclear cataract, unspecified eye: Secondary | ICD-10-CM | POA: Diagnosis not present

## 2012-07-15 DIAGNOSIS — I10 Essential (primary) hypertension: Secondary | ICD-10-CM | POA: Diagnosis not present

## 2012-07-15 DIAGNOSIS — I359 Nonrheumatic aortic valve disorder, unspecified: Secondary | ICD-10-CM | POA: Diagnosis not present

## 2012-08-13 ENCOUNTER — Ambulatory Visit: Payer: Self-pay | Admitting: Oncology

## 2012-08-13 DIAGNOSIS — M810 Age-related osteoporosis without current pathological fracture: Secondary | ICD-10-CM | POA: Diagnosis not present

## 2012-08-13 DIAGNOSIS — I10 Essential (primary) hypertension: Secondary | ICD-10-CM | POA: Diagnosis not present

## 2012-08-13 DIAGNOSIS — E785 Hyperlipidemia, unspecified: Secondary | ICD-10-CM | POA: Diagnosis not present

## 2012-08-13 DIAGNOSIS — Z79811 Long term (current) use of aromatase inhibitors: Secondary | ICD-10-CM | POA: Diagnosis not present

## 2012-08-13 DIAGNOSIS — C50919 Malignant neoplasm of unspecified site of unspecified female breast: Secondary | ICD-10-CM | POA: Diagnosis not present

## 2012-08-13 DIAGNOSIS — Z170 Estrogen receptor positive status [ER+]: Secondary | ICD-10-CM | POA: Diagnosis not present

## 2012-08-13 DIAGNOSIS — Z9010 Acquired absence of unspecified breast and nipple: Secondary | ICD-10-CM | POA: Diagnosis not present

## 2012-08-15 DIAGNOSIS — I10 Essential (primary) hypertension: Secondary | ICD-10-CM | POA: Diagnosis not present

## 2012-08-15 DIAGNOSIS — Z170 Estrogen receptor positive status [ER+]: Secondary | ICD-10-CM | POA: Diagnosis not present

## 2012-08-15 DIAGNOSIS — Z79811 Long term (current) use of aromatase inhibitors: Secondary | ICD-10-CM | POA: Diagnosis not present

## 2012-08-15 DIAGNOSIS — M810 Age-related osteoporosis without current pathological fracture: Secondary | ICD-10-CM | POA: Diagnosis not present

## 2012-08-15 DIAGNOSIS — E785 Hyperlipidemia, unspecified: Secondary | ICD-10-CM | POA: Diagnosis not present

## 2012-08-15 DIAGNOSIS — C50919 Malignant neoplasm of unspecified site of unspecified female breast: Secondary | ICD-10-CM | POA: Diagnosis not present

## 2012-08-15 LAB — COMPREHENSIVE METABOLIC PANEL
Albumin: 4.1 g/dL (ref 3.4–5.0)
Alkaline Phosphatase: 71 U/L (ref 50–136)
Anion Gap: 9 (ref 7–16)
BUN: 18 mg/dL (ref 7–18)
Bilirubin,Total: 0.7 mg/dL (ref 0.2–1.0)
Calcium, Total: 9.3 mg/dL (ref 8.5–10.1)
Chloride: 103 mmol/L (ref 98–107)
Co2: 29 mmol/L (ref 21–32)
Creatinine: 0.65 mg/dL (ref 0.60–1.30)
EGFR (African American): >60
EGFR (Non-African Amer.): >60
Glucose: 113 mg/dL — ABNORMAL HIGH (ref 65–99)
Osmolality: 284 (ref 275–301)
Potassium: 4.3 mmol/L (ref 3.5–5.1)
SGOT(AST): 18 U/L (ref 15–37)
SGPT (ALT): 26 U/L (ref 12–78)
Sodium: 141 mmol/L (ref 136–145)
Total Protein: 7.4 g/dL (ref 6.4–8.2)

## 2012-08-15 LAB — CBC CANCER CENTER
Basophil #: 0.1 x10 3/mm (ref 0.0–0.1)
Basophil %: 1 %
Eosinophil #: 0.2 x10 3/mm (ref 0.0–0.7)
Eosinophil %: 3.2 %
HCT: 40.2 % (ref 35.0–47.0)
HGB: 14 g/dL (ref 12.0–16.0)
Lymphocyte #: 1.5 x10 3/mm (ref 1.0–3.6)
Lymphocyte %: 21.1 %
MCH: 33.7 pg (ref 26.0–34.0)
MCHC: 34.8 g/dL (ref 32.0–36.0)
MCV: 97 fL (ref 80–100)
Monocyte #: 0.8 x10 3/mm (ref 0.2–0.9)
Monocyte %: 10.9 %
Neutrophil #: 4.4 x10 3/mm (ref 1.4–6.5)
Neutrophil %: 63.8 %
Platelet: 181 x10 3/mm (ref 150–440)
RBC: 4.14 10*6/uL (ref 3.80–5.20)
RDW: 12.5 % (ref 11.5–14.5)
WBC: 6.9 x10 3/mm (ref 3.6–11.0)

## 2012-08-16 ENCOUNTER — Ambulatory Visit: Payer: Self-pay | Admitting: Oncology

## 2012-08-16 LAB — CANCER ANTIGEN 27.29: CA 27.29: 24.7 U/mL (ref 0.0–38.6)

## 2012-09-01 DIAGNOSIS — I10 Essential (primary) hypertension: Secondary | ICD-10-CM | POA: Diagnosis not present

## 2012-12-18 ENCOUNTER — Ambulatory Visit: Payer: Self-pay | Admitting: Surgery

## 2012-12-18 DIAGNOSIS — R928 Other abnormal and inconclusive findings on diagnostic imaging of breast: Secondary | ICD-10-CM | POA: Diagnosis not present

## 2012-12-18 DIAGNOSIS — Z853 Personal history of malignant neoplasm of breast: Secondary | ICD-10-CM | POA: Diagnosis not present

## 2012-12-18 IMAGING — MG MM CAD DIAGNOSTIC MAMMO
1 series · 7 of 7 positions shown · non-contrast
Comparison: none

REASON FOR EXAM: HX BRST CA
COMMENTS:

[R CC · right · 7 of 7 slices shown]
[im 1/7]
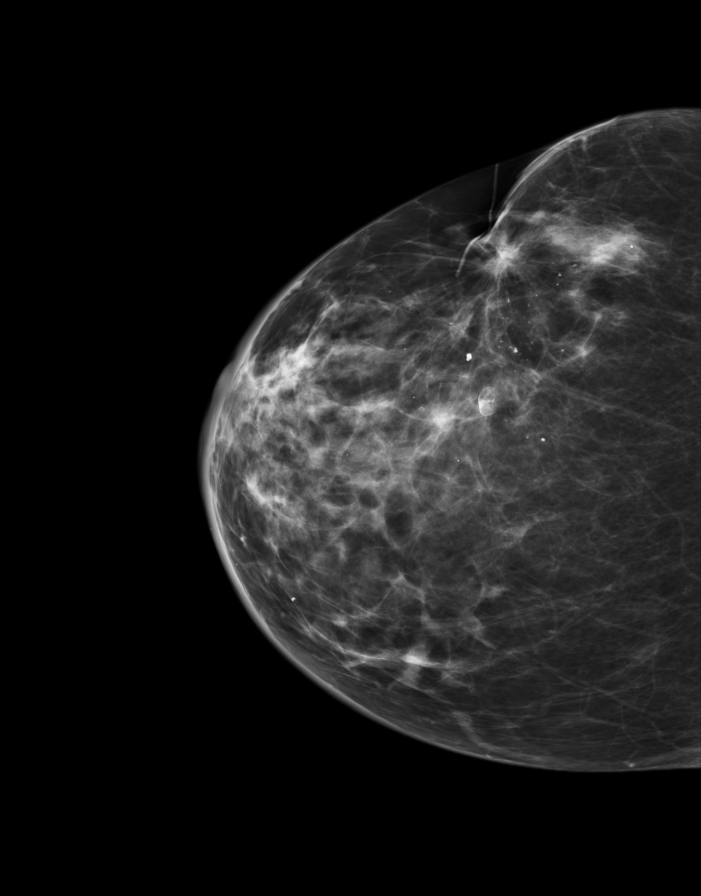
[im 2/7]
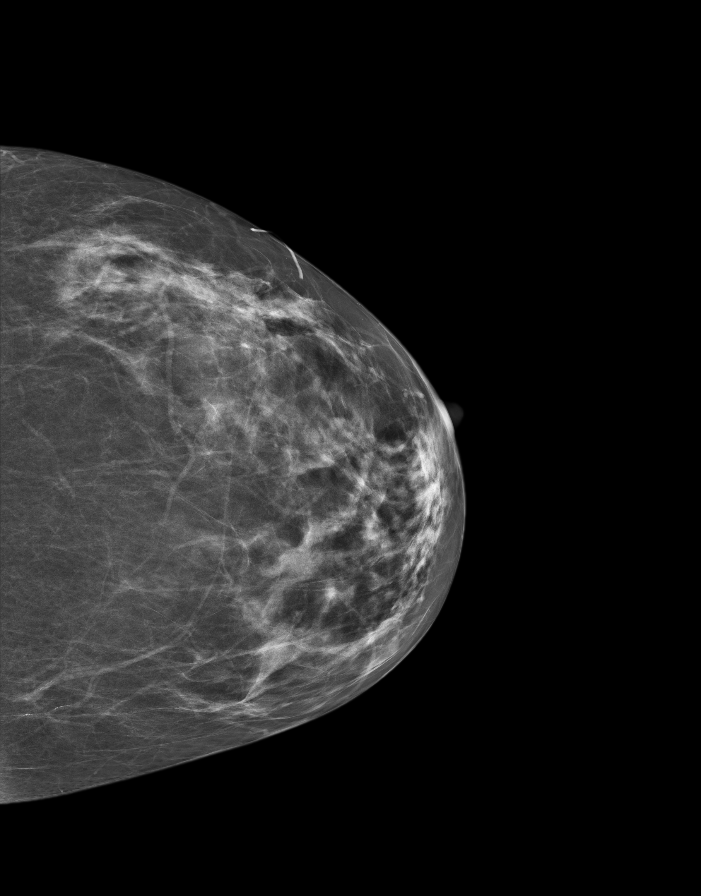
[im 3/7]
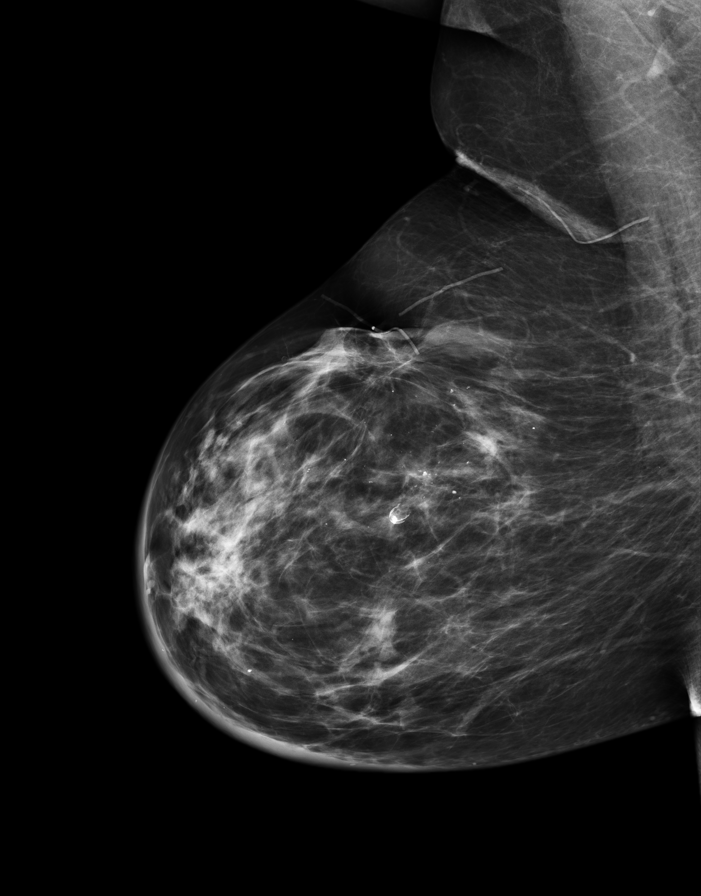
[im 4/7]
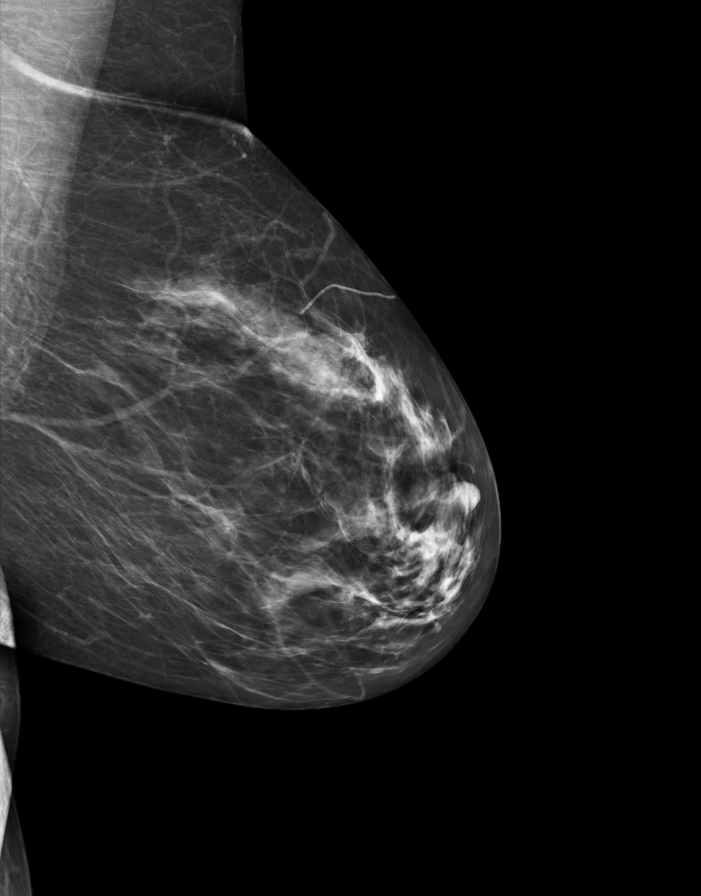
[im 5/7]
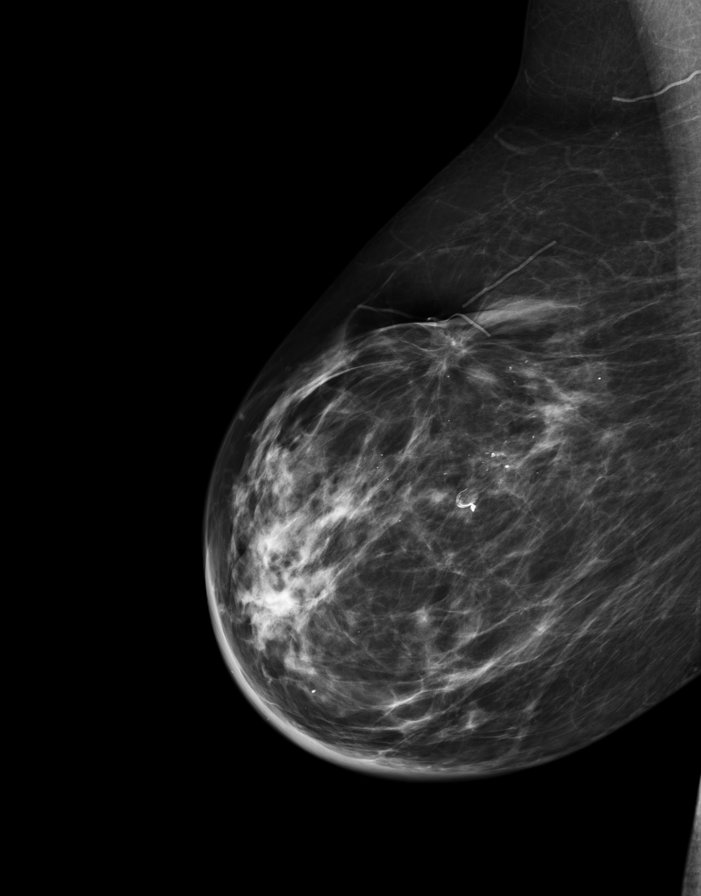
[im 6/7]
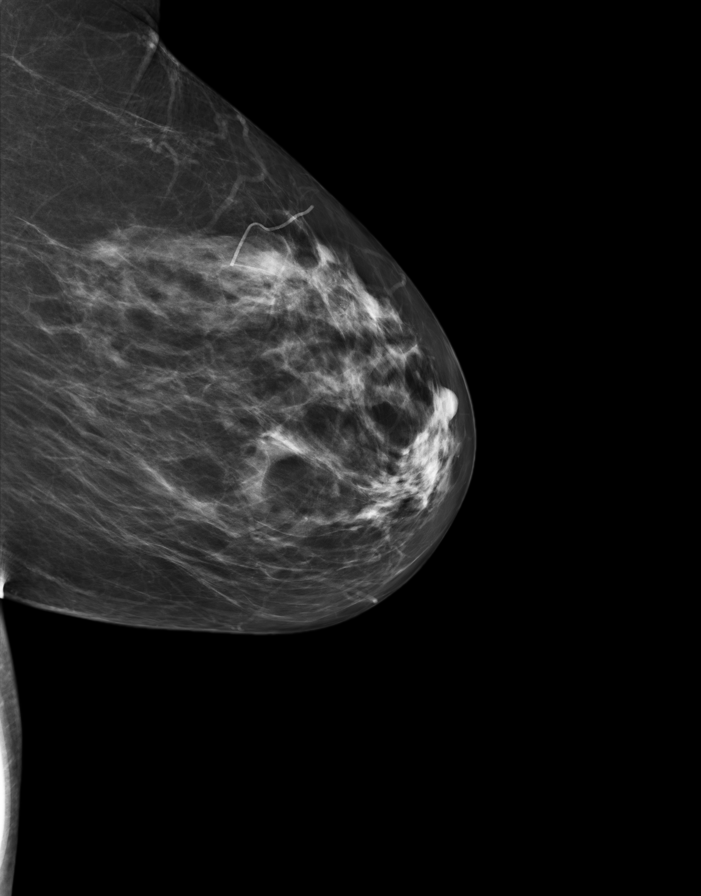
[im 7/7]
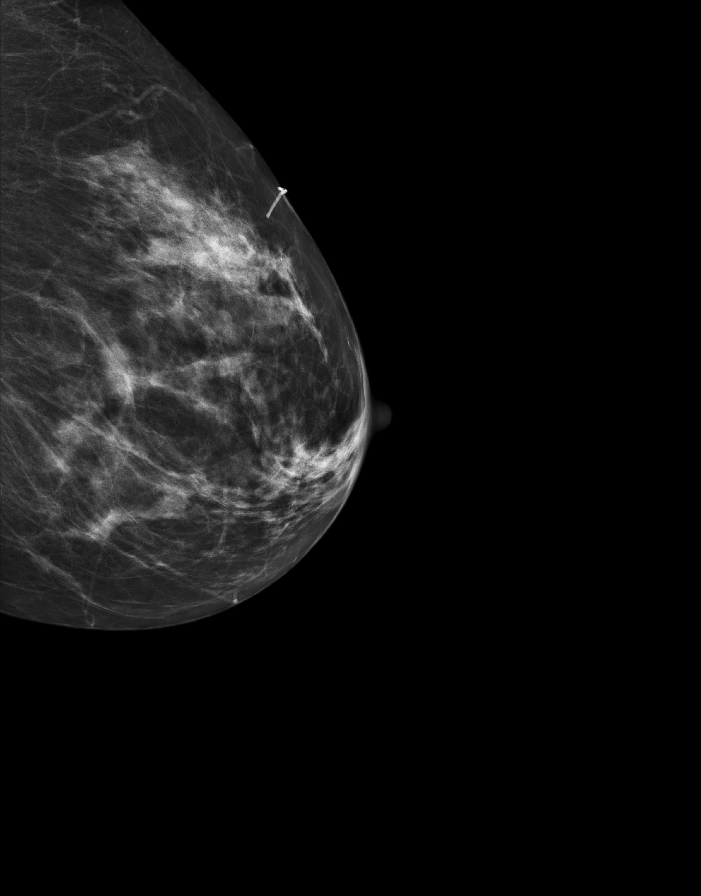

[7 of 7 positions shown; findings below may reference images not displayed]

PROCEDURE:     MAM - MAM DGTL DIAGNOSTIC MAMMO W/CAD  - [DATE] [DATE]

RESULT:     Comparison made to prior studies dated to [DATE]. No mass. No
pathologic clustered calcification demonstrated. Changes of scarring noted
right breast. Skin thickening consistent with scarring most likely from
radiation .
IMPRESSION: Benign exam. Postsurgical and treatment changes right
breast are stable. Routine yearly followup mammogram suggested.

BI-RADS: Category 2- Benign Finding

A NEGATIVE MAMMOGRAM REPORT DOES NOT PRECLUDE BIOPSY OR OTHER EVALUATION OF
A CLINICALLY PALPABLE OR OTHERWISE SUSPICIOUS MASS OR LESION. BREAST CANCER
MAY NOT BE DETECTED IN UP TO 10% OF CASES.

Thank you for the oppurtunity to contribute to the care of your patient.
BREAST COMPOSITION: The breast composition is HETEROGENEOUSLY DENSE
(glandular tissue is 51-75%) This may decrease the sensitivity of
mammography.

## 2012-12-23 DIAGNOSIS — L918 Other hypertrophic disorders of the skin: Secondary | ICD-10-CM | POA: Diagnosis not present

## 2012-12-23 DIAGNOSIS — D485 Neoplasm of uncertain behavior of skin: Secondary | ICD-10-CM | POA: Diagnosis not present

## 2012-12-23 DIAGNOSIS — L821 Other seborrheic keratosis: Secondary | ICD-10-CM | POA: Diagnosis not present

## 2012-12-23 DIAGNOSIS — Z1283 Encounter for screening for malignant neoplasm of skin: Secondary | ICD-10-CM | POA: Diagnosis not present

## 2012-12-23 DIAGNOSIS — L908 Other atrophic disorders of skin: Secondary | ICD-10-CM | POA: Diagnosis not present

## 2012-12-26 DIAGNOSIS — Z853 Personal history of malignant neoplasm of breast: Secondary | ICD-10-CM | POA: Diagnosis not present

## 2013-03-30 DIAGNOSIS — I10 Essential (primary) hypertension: Secondary | ICD-10-CM | POA: Diagnosis not present

## 2013-03-30 DIAGNOSIS — C50919 Malignant neoplasm of unspecified site of unspecified female breast: Secondary | ICD-10-CM | POA: Diagnosis not present

## 2013-03-30 DIAGNOSIS — Z23 Encounter for immunization: Secondary | ICD-10-CM | POA: Diagnosis not present

## 2013-03-30 DIAGNOSIS — Z0000 Encounter for general adult medical examination without abnormal findings: Secondary | ICD-10-CM | POA: Diagnosis not present

## 2013-03-30 DIAGNOSIS — M899 Disorder of bone, unspecified: Secondary | ICD-10-CM | POA: Diagnosis not present

## 2013-07-01 DIAGNOSIS — I359 Nonrheumatic aortic valve disorder, unspecified: Secondary | ICD-10-CM | POA: Diagnosis not present

## 2013-07-01 DIAGNOSIS — R079 Chest pain, unspecified: Secondary | ICD-10-CM | POA: Diagnosis not present

## 2013-07-17 DIAGNOSIS — I359 Nonrheumatic aortic valve disorder, unspecified: Secondary | ICD-10-CM | POA: Diagnosis not present

## 2013-09-10 ENCOUNTER — Ambulatory Visit: Payer: Self-pay | Admitting: Oncology

## 2013-09-10 DIAGNOSIS — Z800 Family history of malignant neoplasm of digestive organs: Secondary | ICD-10-CM | POA: Diagnosis not present

## 2013-09-10 DIAGNOSIS — I10 Essential (primary) hypertension: Secondary | ICD-10-CM | POA: Diagnosis not present

## 2013-09-10 DIAGNOSIS — Z79811 Long term (current) use of aromatase inhibitors: Secondary | ICD-10-CM | POA: Diagnosis not present

## 2013-09-10 DIAGNOSIS — Z170 Estrogen receptor positive status [ER+]: Secondary | ICD-10-CM | POA: Diagnosis not present

## 2013-09-10 DIAGNOSIS — M810 Age-related osteoporosis without current pathological fracture: Secondary | ICD-10-CM | POA: Diagnosis not present

## 2013-09-10 DIAGNOSIS — Z7982 Long term (current) use of aspirin: Secondary | ICD-10-CM | POA: Diagnosis not present

## 2013-09-10 DIAGNOSIS — Z9010 Acquired absence of unspecified breast and nipple: Secondary | ICD-10-CM | POA: Diagnosis not present

## 2013-09-10 DIAGNOSIS — E785 Hyperlipidemia, unspecified: Secondary | ICD-10-CM | POA: Diagnosis not present

## 2013-09-10 DIAGNOSIS — C50919 Malignant neoplasm of unspecified site of unspecified female breast: Secondary | ICD-10-CM | POA: Diagnosis not present

## 2013-09-10 DIAGNOSIS — Z79899 Other long term (current) drug therapy: Secondary | ICD-10-CM | POA: Diagnosis not present

## 2013-09-10 DIAGNOSIS — Z803 Family history of malignant neoplasm of breast: Secondary | ICD-10-CM | POA: Diagnosis not present

## 2013-09-11 DIAGNOSIS — C50919 Malignant neoplasm of unspecified site of unspecified female breast: Secondary | ICD-10-CM | POA: Diagnosis not present

## 2013-09-11 DIAGNOSIS — Z170 Estrogen receptor positive status [ER+]: Secondary | ICD-10-CM | POA: Diagnosis not present

## 2013-09-11 DIAGNOSIS — Z79811 Long term (current) use of aromatase inhibitors: Secondary | ICD-10-CM | POA: Diagnosis not present

## 2013-09-11 DIAGNOSIS — M810 Age-related osteoporosis without current pathological fracture: Secondary | ICD-10-CM | POA: Diagnosis not present

## 2013-09-14 DIAGNOSIS — H2510 Age-related nuclear cataract, unspecified eye: Secondary | ICD-10-CM | POA: Diagnosis not present

## 2013-09-16 ENCOUNTER — Ambulatory Visit: Payer: Self-pay | Admitting: Oncology

## 2013-10-01 DIAGNOSIS — I10 Essential (primary) hypertension: Secondary | ICD-10-CM | POA: Diagnosis not present

## 2013-10-22 ENCOUNTER — Ambulatory Visit: Payer: Self-pay | Admitting: Oncology

## 2013-10-22 DIAGNOSIS — M899 Disorder of bone, unspecified: Secondary | ICD-10-CM | POA: Diagnosis not present

## 2013-10-22 DIAGNOSIS — M949 Disorder of cartilage, unspecified: Secondary | ICD-10-CM | POA: Diagnosis not present

## 2013-10-22 DIAGNOSIS — N959 Unspecified menopausal and perimenopausal disorder: Secondary | ICD-10-CM | POA: Diagnosis not present

## 2013-12-22 ENCOUNTER — Ambulatory Visit: Payer: Self-pay | Admitting: Surgery

## 2013-12-22 DIAGNOSIS — R922 Inconclusive mammogram: Secondary | ICD-10-CM | POA: Diagnosis not present

## 2013-12-22 DIAGNOSIS — Z853 Personal history of malignant neoplasm of breast: Secondary | ICD-10-CM | POA: Diagnosis not present

## 2013-12-22 IMAGING — MG MM DIGITAL DIAGNOSTIC BILAT W/ CAD
1 series · 6 of 6 positions shown · non-contrast
Comparison: With priors

CLINICAL DATA: History of right breast cancer, status post
lumpectomy in [0J]. The patient completed chemotherapy and radiation
therapy.

EXAM:
DIGITAL DIAGNOSTIC  BILATERAL MAMMOGRAM WITH CAD

[R CC · right · 6 of 6 slices shown]
[im 1/6]
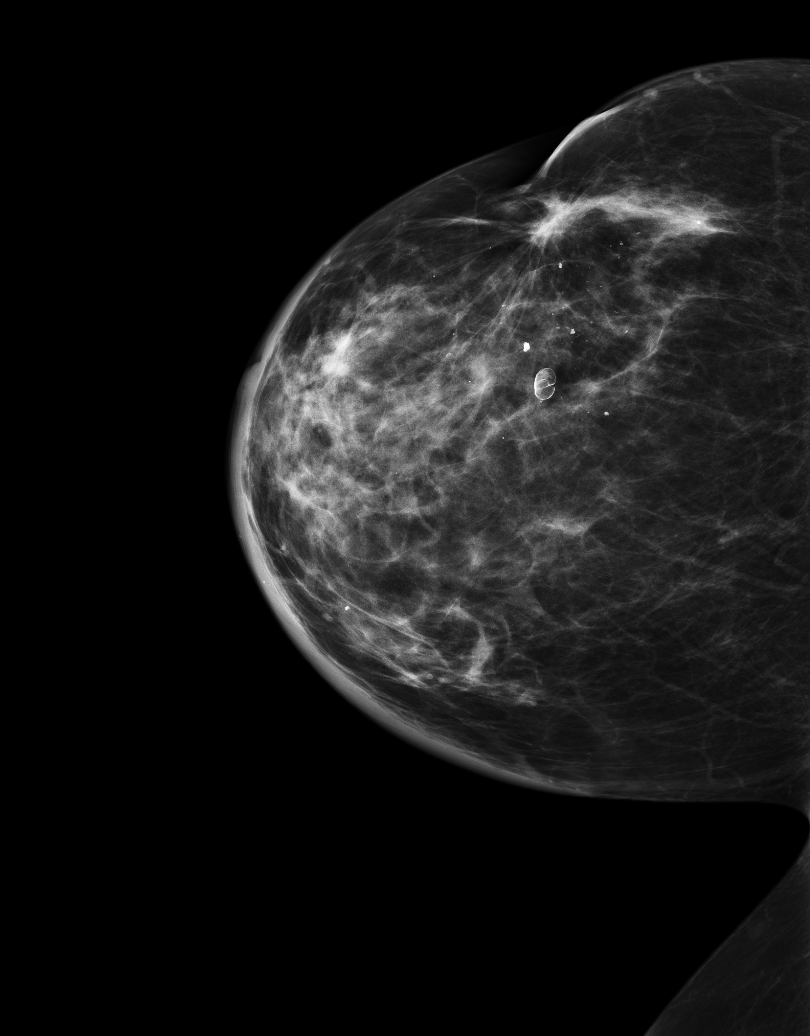
[im 2/6]
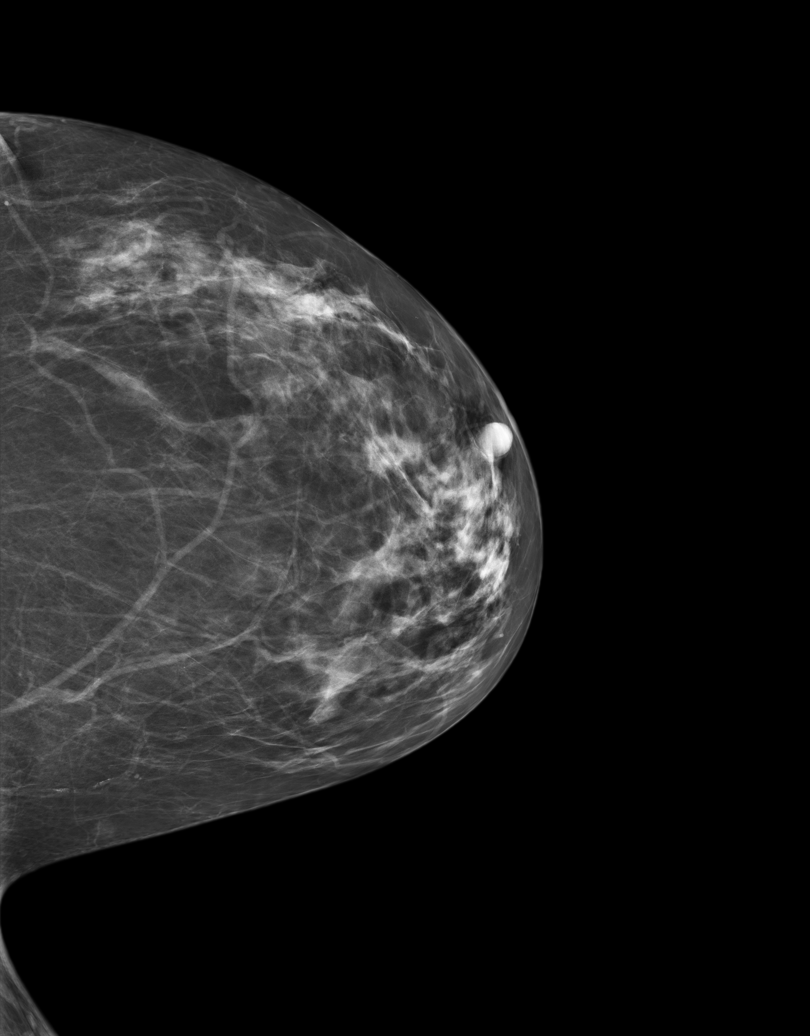
[im 3/6]
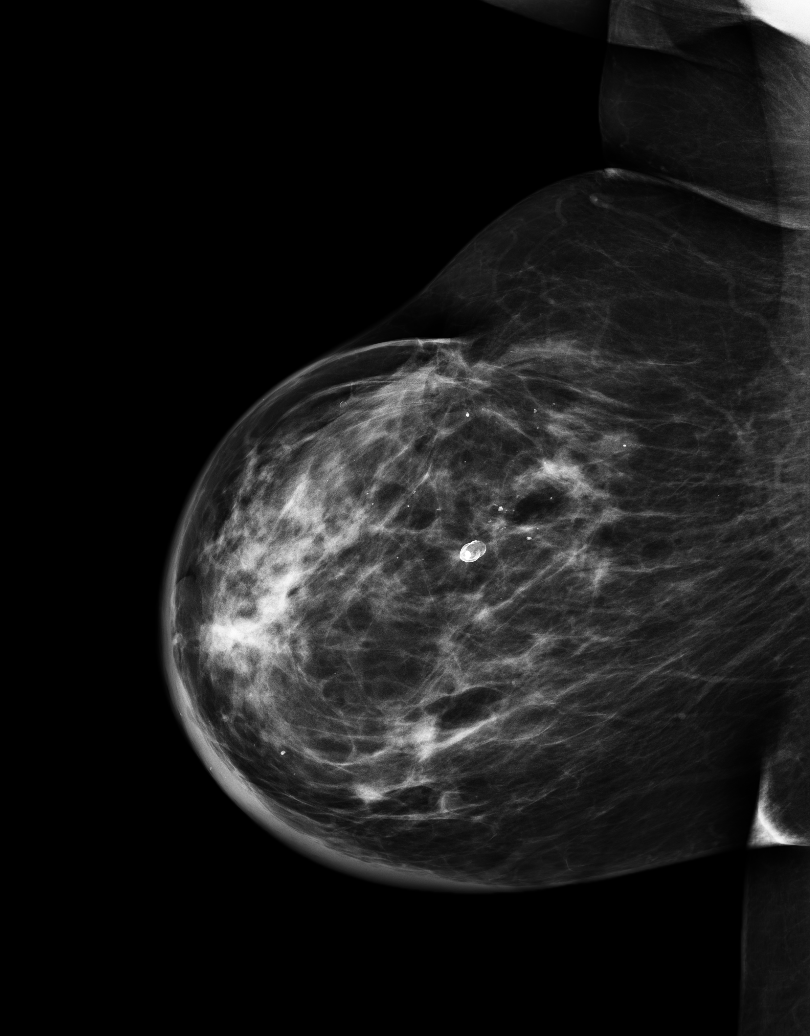
[im 4/6]
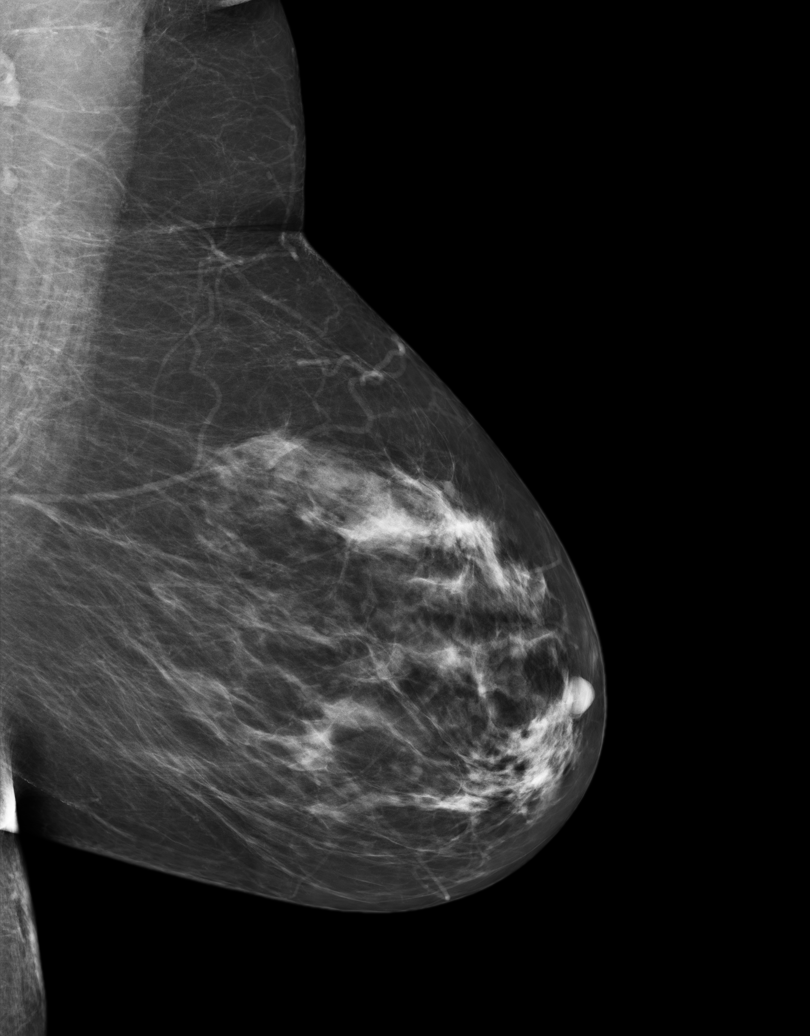
[im 5/6]
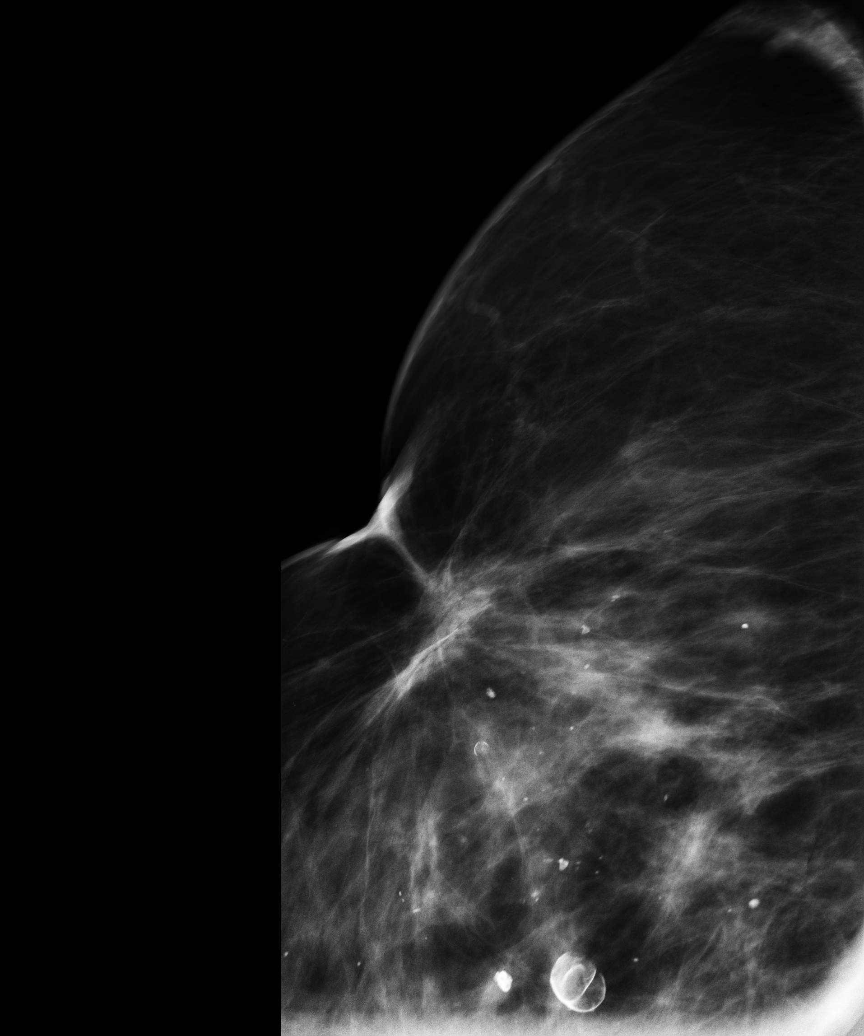
[im 6/6]
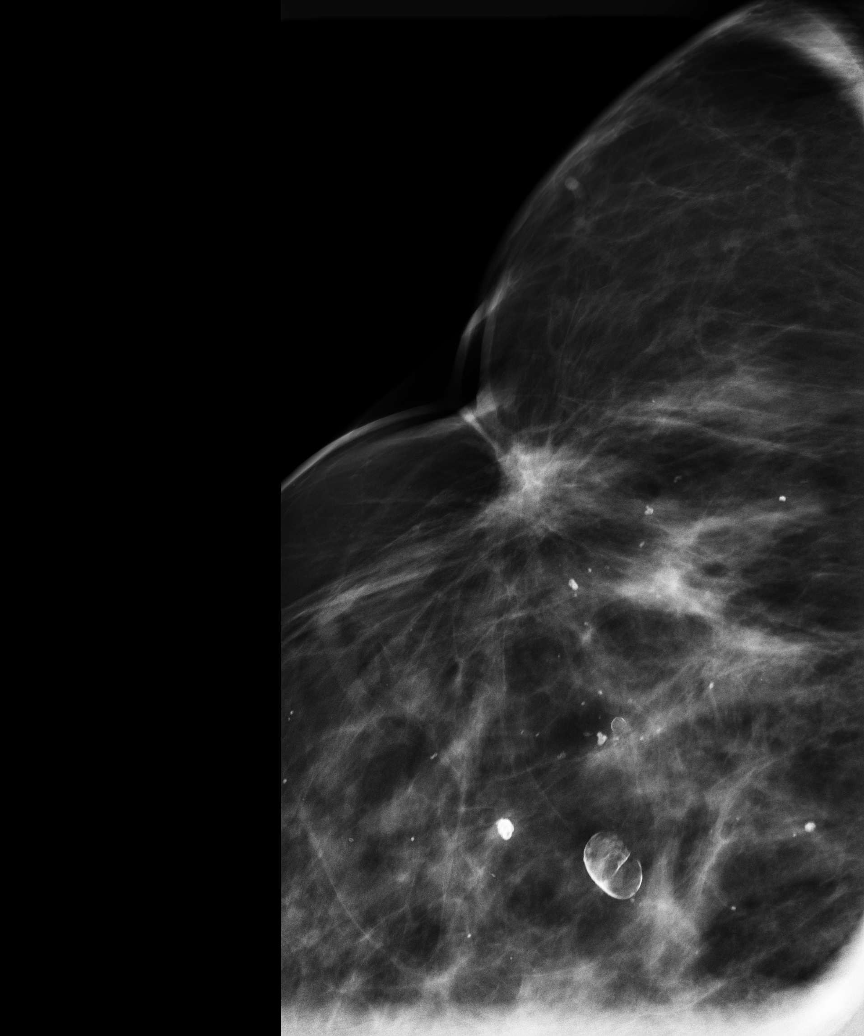

[6 of 6 positions shown; findings below may reference images not displayed]

ACR Breast Density Category c: The breast tissue is heterogeneously
dense, which may obscure small masses.
FINDINGS: There are stable lumpectomy changes in the outer right breast. Skin
thickening of the anterior right breast is compatible with radiation
change. No mass, nonsurgical distortion, or suspicious
microcalcification is identified in either breast to suggest
malignancy.

Mammographic images were processed with CAD.
IMPRESSION: Posttreatment changes of the right breast. No evidence of malignancy
in either breast.

RECOMMENDATION:
Diagnostic mammogram is suggested in 1 year. (Code:[0J])

I have discussed the findings and recommendations with the patient.
Results were also provided in writing at the conclusion of the
visit. If applicable, a reminder letter will be sent to the patient
regarding the next appointment.

BI-RADS CATEGORY  2: Benign.

## 2013-12-29 DIAGNOSIS — L919 Hypertrophic disorder of the skin, unspecified: Secondary | ICD-10-CM | POA: Diagnosis not present

## 2013-12-29 DIAGNOSIS — Z872 Personal history of diseases of the skin and subcutaneous tissue: Secondary | ICD-10-CM | POA: Diagnosis not present

## 2013-12-29 DIAGNOSIS — D485 Neoplasm of uncertain behavior of skin: Secondary | ICD-10-CM | POA: Diagnosis not present

## 2013-12-29 DIAGNOSIS — L909 Atrophic disorder of skin, unspecified: Secondary | ICD-10-CM | POA: Diagnosis not present

## 2013-12-29 DIAGNOSIS — C44611 Basal cell carcinoma of skin of unspecified upper limb, including shoulder: Secondary | ICD-10-CM | POA: Diagnosis not present

## 2013-12-29 DIAGNOSIS — Z1283 Encounter for screening for malignant neoplasm of skin: Secondary | ICD-10-CM | POA: Diagnosis not present

## 2014-01-12 DIAGNOSIS — I10 Essential (primary) hypertension: Secondary | ICD-10-CM | POA: Diagnosis not present

## 2014-01-12 DIAGNOSIS — R011 Cardiac murmur, unspecified: Secondary | ICD-10-CM | POA: Diagnosis not present

## 2014-01-12 DIAGNOSIS — C50919 Malignant neoplasm of unspecified site of unspecified female breast: Secondary | ICD-10-CM | POA: Diagnosis not present

## 2014-01-12 DIAGNOSIS — I359 Nonrheumatic aortic valve disorder, unspecified: Secondary | ICD-10-CM | POA: Diagnosis not present

## 2014-01-18 DIAGNOSIS — Z853 Personal history of malignant neoplasm of breast: Secondary | ICD-10-CM | POA: Diagnosis not present

## 2014-02-15 DIAGNOSIS — C44611 Basal cell carcinoma of skin of unspecified upper limb, including shoulder: Secondary | ICD-10-CM | POA: Diagnosis not present

## 2014-04-26 DIAGNOSIS — M8580 Other specified disorders of bone density and structure, unspecified site: Secondary | ICD-10-CM | POA: Diagnosis not present

## 2014-04-26 DIAGNOSIS — I10 Essential (primary) hypertension: Secondary | ICD-10-CM | POA: Diagnosis not present

## 2014-04-26 DIAGNOSIS — Z23 Encounter for immunization: Secondary | ICD-10-CM | POA: Diagnosis not present

## 2014-04-26 DIAGNOSIS — Z0000 Encounter for general adult medical examination without abnormal findings: Secondary | ICD-10-CM | POA: Diagnosis not present

## 2014-04-26 DIAGNOSIS — I350 Nonrheumatic aortic (valve) stenosis: Secondary | ICD-10-CM | POA: Diagnosis not present

## 2014-04-26 DIAGNOSIS — C50911 Malignant neoplasm of unspecified site of right female breast: Secondary | ICD-10-CM | POA: Diagnosis not present

## 2014-05-03 DIAGNOSIS — E0590 Thyrotoxicosis, unspecified without thyrotoxic crisis or storm: Secondary | ICD-10-CM | POA: Diagnosis not present

## 2014-07-16 DIAGNOSIS — E0590 Thyrotoxicosis, unspecified without thyrotoxic crisis or storm: Secondary | ICD-10-CM | POA: Diagnosis not present

## 2014-09-20 DIAGNOSIS — Z85828 Personal history of other malignant neoplasm of skin: Secondary | ICD-10-CM | POA: Diagnosis not present

## 2014-09-20 DIAGNOSIS — Z872 Personal history of diseases of the skin and subcutaneous tissue: Secondary | ICD-10-CM | POA: Diagnosis not present

## 2014-09-20 DIAGNOSIS — Z1283 Encounter for screening for malignant neoplasm of skin: Secondary | ICD-10-CM | POA: Diagnosis not present

## 2014-09-20 DIAGNOSIS — Z08 Encounter for follow-up examination after completed treatment for malignant neoplasm: Secondary | ICD-10-CM | POA: Diagnosis not present

## 2014-09-27 ENCOUNTER — Ambulatory Visit
Admit: 2014-09-27 | Disposition: A | Discharge status: Home / Self Care | Payer: Self-pay | Attending: Oncology | Admitting: Oncology

## 2014-09-27 DIAGNOSIS — Z79811 Long term (current) use of aromatase inhibitors: Secondary | ICD-10-CM | POA: Diagnosis not present

## 2014-09-27 DIAGNOSIS — Z9011 Acquired absence of right breast and nipple: Secondary | ICD-10-CM | POA: Diagnosis not present

## 2014-09-27 DIAGNOSIS — Z9049 Acquired absence of other specified parts of digestive tract: Secondary | ICD-10-CM | POA: Diagnosis not present

## 2014-09-27 DIAGNOSIS — I10 Essential (primary) hypertension: Secondary | ICD-10-CM | POA: Diagnosis not present

## 2014-09-27 DIAGNOSIS — Z79899 Other long term (current) drug therapy: Secondary | ICD-10-CM | POA: Diagnosis not present

## 2014-09-27 DIAGNOSIS — Z803 Family history of malignant neoplasm of breast: Secondary | ICD-10-CM | POA: Diagnosis not present

## 2014-09-27 DIAGNOSIS — Z170 Estrogen receptor positive status [ER+]: Secondary | ICD-10-CM | POA: Diagnosis not present

## 2014-09-27 DIAGNOSIS — Z7982 Long term (current) use of aspirin: Secondary | ICD-10-CM | POA: Diagnosis not present

## 2014-09-27 DIAGNOSIS — C50911 Malignant neoplasm of unspecified site of right female breast: Secondary | ICD-10-CM | POA: Diagnosis not present

## 2014-09-27 DIAGNOSIS — Z800 Family history of malignant neoplasm of digestive organs: Secondary | ICD-10-CM | POA: Diagnosis not present

## 2014-09-27 DIAGNOSIS — E785 Hyperlipidemia, unspecified: Secondary | ICD-10-CM | POA: Diagnosis not present

## 2014-09-27 DIAGNOSIS — M818 Other osteoporosis without current pathological fracture: Secondary | ICD-10-CM | POA: Diagnosis not present

## 2014-10-08 DIAGNOSIS — E0500 Thyrotoxicosis with diffuse goiter without thyrotoxic crisis or storm: Secondary | ICD-10-CM | POA: Diagnosis not present

## 2014-10-15 DIAGNOSIS — E0590 Thyrotoxicosis, unspecified without thyrotoxic crisis or storm: Secondary | ICD-10-CM | POA: Diagnosis not present

## 2014-10-18 DIAGNOSIS — E0500 Thyrotoxicosis with diffuse goiter without thyrotoxic crisis or storm: Secondary | ICD-10-CM | POA: Diagnosis not present

## 2014-10-18 DIAGNOSIS — I10 Essential (primary) hypertension: Secondary | ICD-10-CM | POA: Diagnosis not present

## 2014-11-26 ENCOUNTER — Other Ambulatory Visit: Payer: Self-pay | Admitting: Family Medicine

## 2014-11-26 NOTE — Telephone Encounter (Signed)
Spoke with pharmacy, they did not receive the prescriptions from 10/18/14 in [employer]. I went ahead and called them in.

## 2014-11-26 NOTE — Telephone Encounter (Signed)
She should have gotten 6 month supplies of those at the beginning of May- can you check with the pharmacy to make sure they got them? They are in her PP chart.

## 2014-12-14 ENCOUNTER — Other Ambulatory Visit: Payer: Self-pay | Admitting: Surgery

## 2014-12-14 DIAGNOSIS — Z853 Personal history of malignant neoplasm of breast: Secondary | ICD-10-CM

## 2014-12-17 DIAGNOSIS — E0590 Thyrotoxicosis, unspecified without thyrotoxic crisis or storm: Secondary | ICD-10-CM | POA: Diagnosis not present

## 2014-12-24 ENCOUNTER — Other Ambulatory Visit: Payer: Medicare Other

## 2014-12-24 ENCOUNTER — Ambulatory Visit: Payer: Medicare Other

## 2014-12-24 DIAGNOSIS — E0500 Thyrotoxicosis with diffuse goiter without thyrotoxic crisis or storm: Secondary | ICD-10-CM | POA: Diagnosis not present

## 2014-12-27 ENCOUNTER — Other Ambulatory Visit: Payer: Self-pay | Admitting: Family Medicine

## 2014-12-28 DIAGNOSIS — H2513 Age-related nuclear cataract, bilateral: Secondary | ICD-10-CM | POA: Diagnosis not present

## 2014-12-29 ENCOUNTER — Ambulatory Visit: Payer: Medicare Other

## 2014-12-29 ENCOUNTER — Ambulatory Visit
Admission: RE | Admit: 2014-12-29 | Discharge: 2014-12-29 | Disposition: A | Discharge status: Home / Self Care | Payer: Medicare Other | Source: Ambulatory Visit | Attending: Surgery | Admitting: Surgery

## 2014-12-29 DIAGNOSIS — Z853 Personal history of malignant neoplasm of breast: Secondary | ICD-10-CM

## 2014-12-29 DIAGNOSIS — R928 Other abnormal and inconclusive findings on diagnostic imaging of breast: Secondary | ICD-10-CM | POA: Diagnosis not present

## 2014-12-29 IMAGING — MG MM DIAG BREAST TOMO BILATERAL
8 of 13 series · 8 of 29 positions shown · non-contrast
Comparison: Previous exam(s).

CLINICAL DATA: Patient with history of right breast lumpectomy
[7I].

EXAM:
DIGITAL DIAGNOSTIC BILATERAL MAMMOGRAM WITH 3D TOMOSYNTHESIS AND CAD

[R TAN]
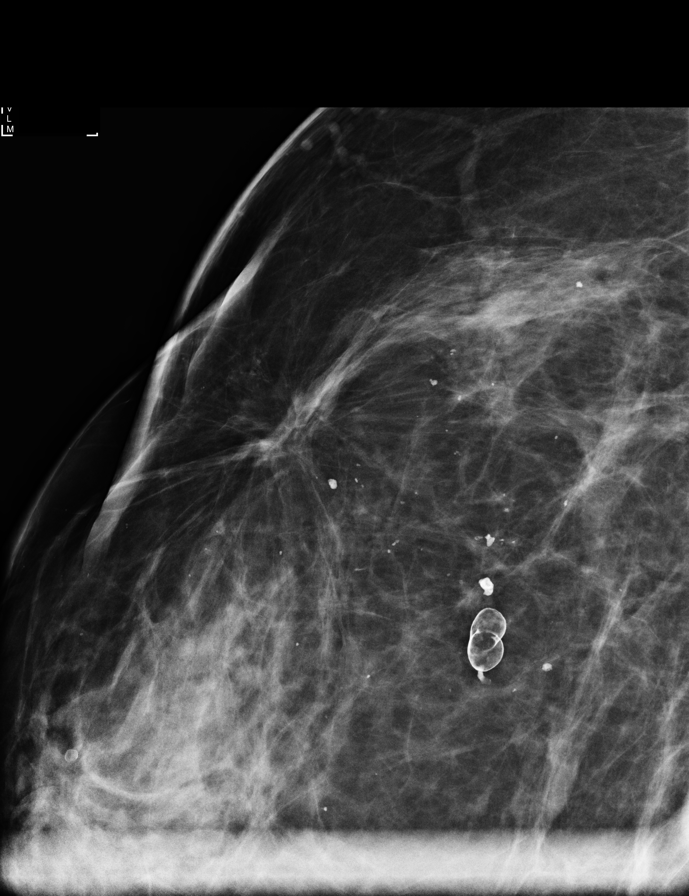

[R MLO]
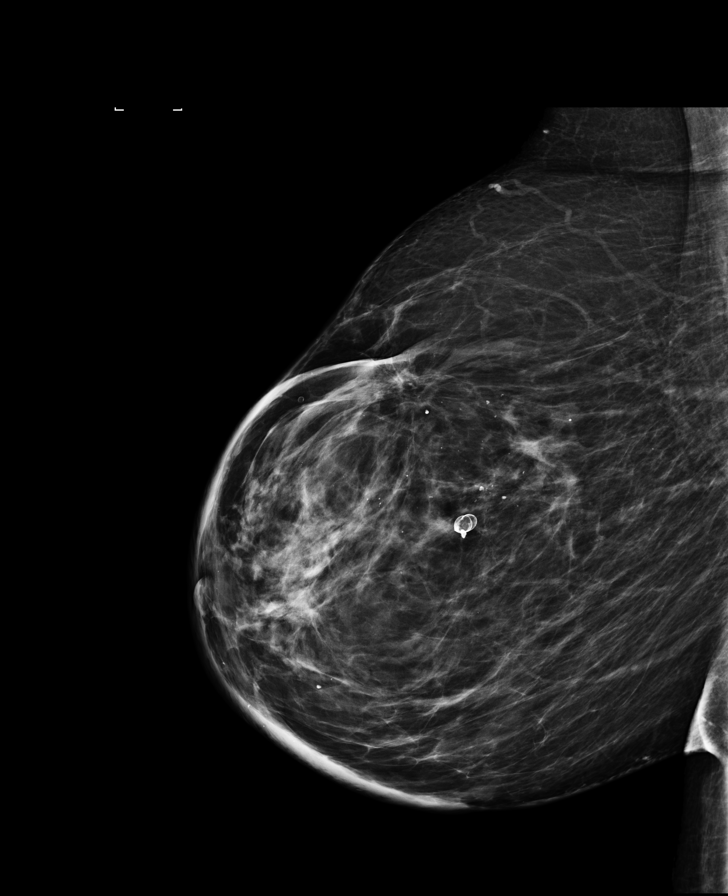

[R CC]
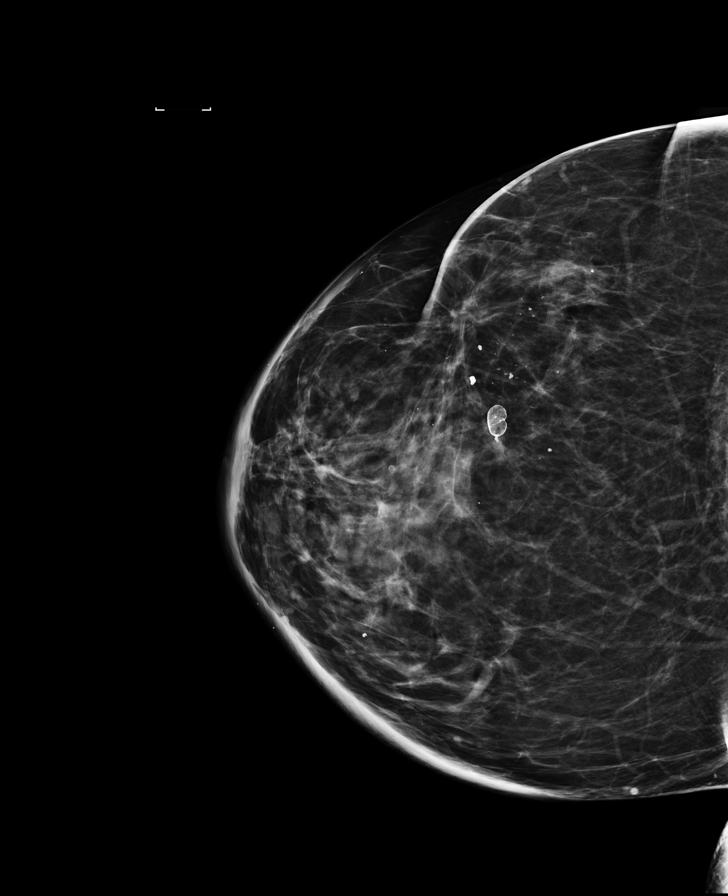

[L MLO synth-2D]
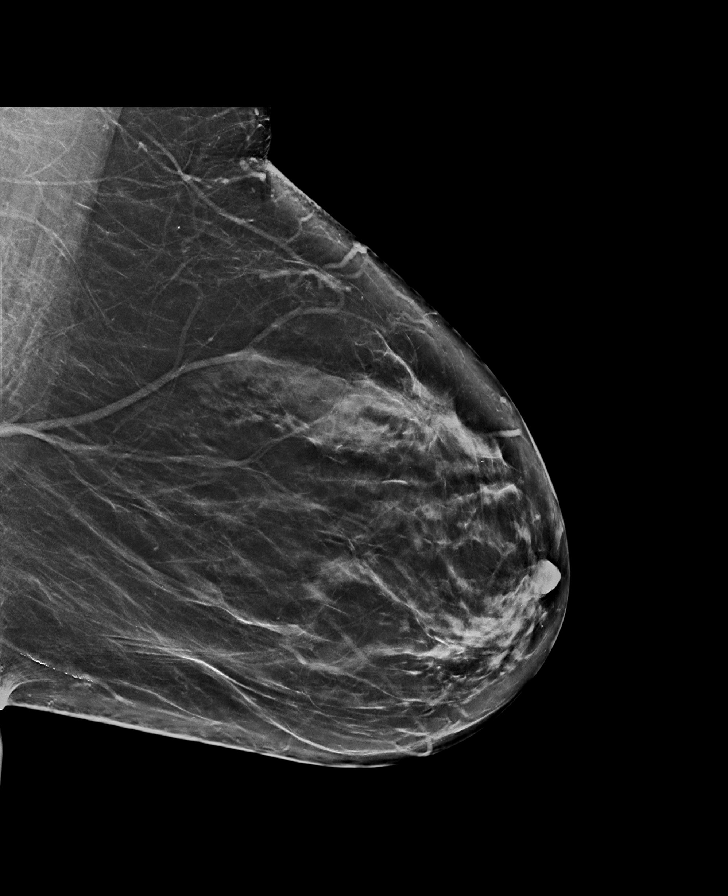

[R CC synth-2D]
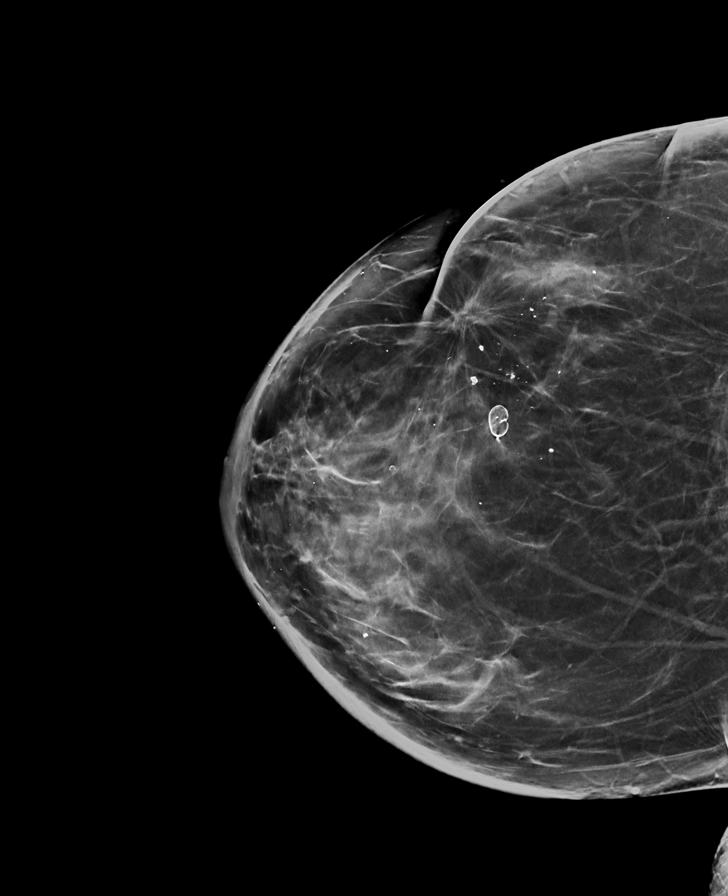

[R MLO synth-2D]
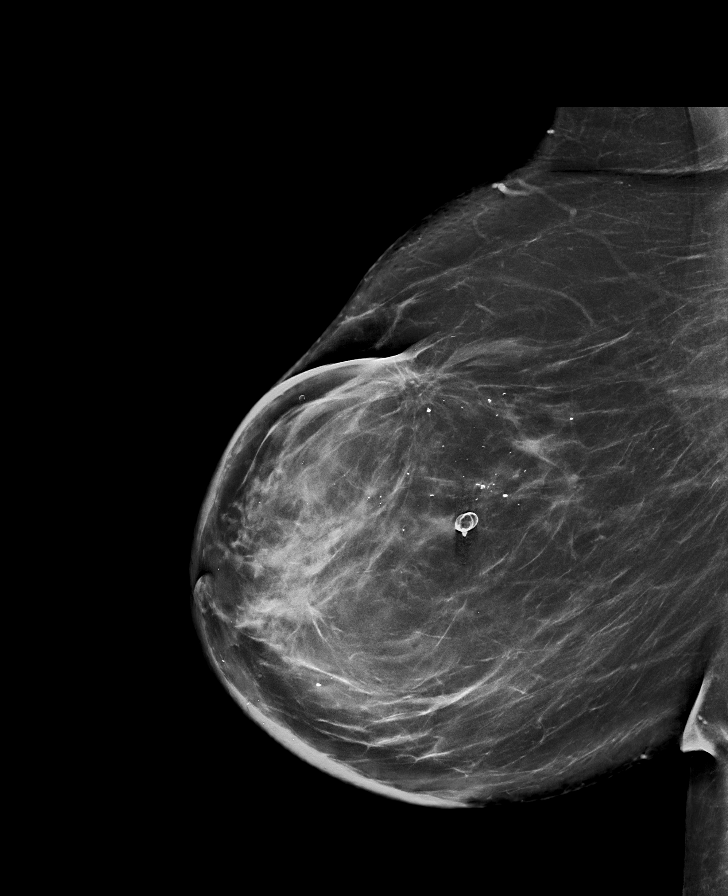

[L CC synth-2D]
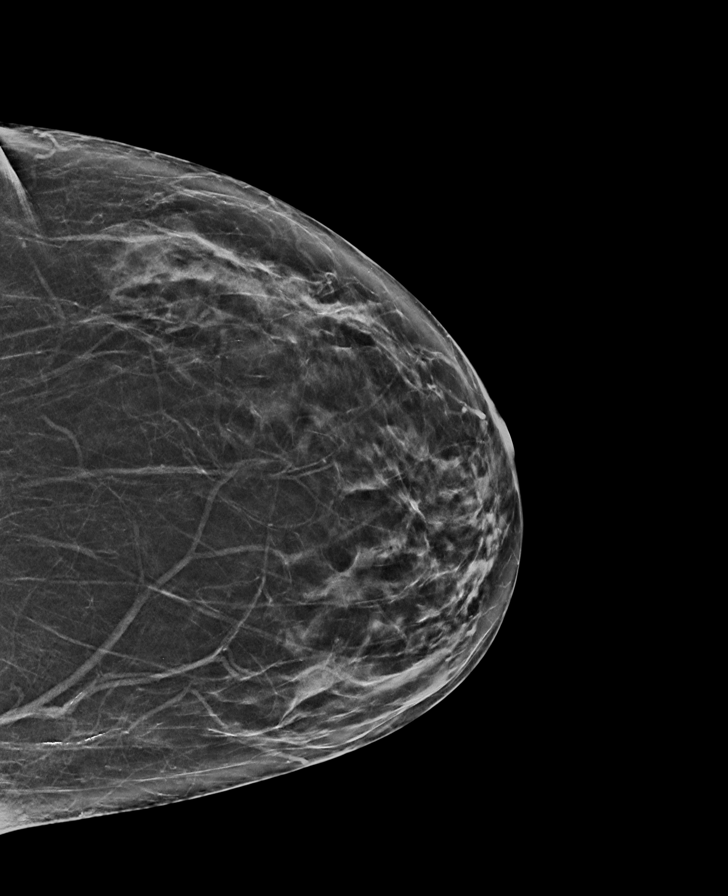

[L CC]
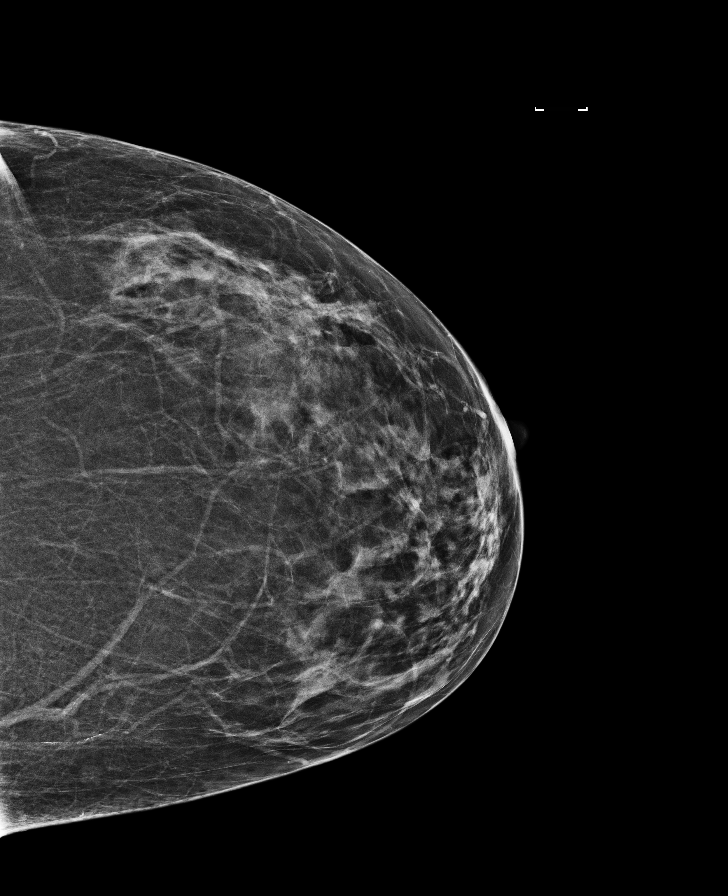

[8 of 29 positions shown; findings below may reference images not displayed]

ACR Breast Density Category c: The breast tissue is heterogeneously
dense, which may obscure small masses.
FINDINGS: Stable postlumpectomy changes right breast. No concerning masses,
calcifications or nonsurgical architectural distortion identified
within either breast.

Mammographic images were processed with CAD.
IMPRESSION: No mammographic evidence malignancy.

RECOMMENDATION:
Bilateral diagnostic mammography in 1 year.

I have discussed the findings and recommendations with the patient.
Results were also provided in writing at the conclusion of the
visit. If applicable, a reminder letter will be sent to the patient
regarding the next appointment.

BI-RADS CATEGORY  2: Benign.

## 2014-12-30 DIAGNOSIS — Z853 Personal history of malignant neoplasm of breast: Secondary | ICD-10-CM | POA: Diagnosis not present

## 2015-01-17 DIAGNOSIS — C50911 Malignant neoplasm of unspecified site of right female breast: Secondary | ICD-10-CM | POA: Diagnosis not present

## 2015-01-17 DIAGNOSIS — I10 Essential (primary) hypertension: Secondary | ICD-10-CM | POA: Diagnosis not present

## 2015-01-17 DIAGNOSIS — R011 Cardiac murmur, unspecified: Secondary | ICD-10-CM | POA: Diagnosis not present

## 2015-01-17 DIAGNOSIS — I350 Nonrheumatic aortic (valve) stenosis: Secondary | ICD-10-CM | POA: Diagnosis not present

## 2015-03-28 ENCOUNTER — Other Ambulatory Visit: Payer: Self-pay | Admitting: *Deleted

## 2015-03-28 MED ORDER — ALENDRONATE SODIUM 70 MG PO TABS: 70.0000 mg | ORAL_TABLET | ORAL | Status: DC

## 2015-04-11 ENCOUNTER — Ambulatory Visit (INDEPENDENT_AMBULATORY_CARE_PROVIDER_SITE_OTHER): Payer: Medicare Other

## 2015-04-11 DIAGNOSIS — Z23 Encounter for immunization: Secondary | ICD-10-CM | POA: Diagnosis not present

## 2015-05-02 ENCOUNTER — Encounter: Payer: Self-pay | Admitting: Family Medicine

## 2015-05-02 ENCOUNTER — Ambulatory Visit (INDEPENDENT_AMBULATORY_CARE_PROVIDER_SITE_OTHER): Payer: Medicare Other | Admitting: Family Medicine

## 2015-05-02 VITALS — BP 143/85 | HR 87 | Temp 98.5°F | Ht 62.7 in | Wt 205.0 lb

## 2015-05-02 DIAGNOSIS — E0590 Thyrotoxicosis, unspecified without thyrotoxic crisis or storm: Secondary | ICD-10-CM

## 2015-05-02 DIAGNOSIS — I350 Nonrheumatic aortic (valve) stenosis: Secondary | ICD-10-CM | POA: Insufficient documentation

## 2015-05-02 DIAGNOSIS — Z853 Personal history of malignant neoplasm of breast: Secondary | ICD-10-CM | POA: Insufficient documentation

## 2015-05-02 DIAGNOSIS — I10 Essential (primary) hypertension: Secondary | ICD-10-CM | POA: Diagnosis not present

## 2015-05-02 DIAGNOSIS — C50211 Malignant neoplasm of upper-inner quadrant of right female breast: Secondary | ICD-10-CM

## 2015-05-02 DIAGNOSIS — Z0000 Encounter for general adult medical examination without abnormal findings: Secondary | ICD-10-CM

## 2015-05-02 DIAGNOSIS — Z1211 Encounter for screening for malignant neoplasm of colon: Secondary | ICD-10-CM | POA: Diagnosis not present

## 2015-05-02 DIAGNOSIS — C50919 Malignant neoplasm of unspecified site of unspecified female breast: Secondary | ICD-10-CM | POA: Insufficient documentation

## 2015-05-02 LAB — URINALYSIS, ROUTINE W REFLEX MICROSCOPIC
Bilirubin, UA: NEGATIVE
Glucose, UA: NEGATIVE
Ketones, UA: NEGATIVE
Leukocytes, UA: NEGATIVE
Nitrite, UA: NEGATIVE
Protein, UA: NEGATIVE
RBC, UA: NEGATIVE
Specific Gravity, UA: 1.005 (ref 1.005–1.030)
Urobilinogen, Ur: 0.2 mg/dL (ref 0.2–1.0)
pH, UA: 7 (ref 5.0–7.5)

## 2015-05-02 MED ORDER — BENAZEPRIL HCL 40 MG PO TABS: 40.0000 mg | ORAL_TABLET | Freq: Every day | ORAL | Status: DC

## 2015-05-02 MED ORDER — AMLODIPINE BESYLATE 5 MG PO TABS: 5.0000 mg | ORAL_TABLET | Freq: Every day | ORAL | Status: DC

## 2015-05-02 NOTE — Assessment & Plan Note (Signed)
Discuss hypertension Will continue current medications and observe blood pressure is too high today. If blood pressure not coming down we will need to increase medications.

## 2015-05-02 NOTE — Progress Notes (Signed)
BP 143/85 mmHg  Pulse 87  Temp(Src) 98.5 F (36.9 C)  Ht 5' 2.7" (1.593 m)  Wt 205 lb (92.987 kg)  BMI 36.64 kg/m2  SpO2 97%   Subjective:    Patient ID: Maria Davies, female    DOB: 1941/04/16, 74 y.o.   MRN: [id number]  HPI: Maria Davies is a 74 y.o. female  Chief Complaint  Patient presents with  . Annual Exam   patient doing well with main concern blood pressure being up slightly especially with aortic valve stenosis Patient feeling well with no edema no complaints from medication taking blood pressure medicines faithfully Thyroid doing well followed by endocrinology and tapering her medication Breast cancer patients graduated not taking any further medication  Relevant past medical, surgical, family and social history reviewed and updated as indicated. Interim medical history since our last visit reviewed. Allergies and medications reviewed and updated.  Review of Systems  Constitutional: Negative.   HENT: Negative.   Eyes: Negative.   Respiratory: Negative.   Cardiovascular: Negative.   Gastrointestinal: Negative.   Endocrine: Negative.   Genitourinary: Negative.   Musculoskeletal: Negative.   Skin: Negative.   Allergic/Immunologic: Negative.   Neurological: Negative.   Hematological: Negative.   Psychiatric/Behavioral: Negative.     Per HPI unless specifically indicated above     Objective:    BP 143/85 mmHg  Pulse 87  Temp(Src) 98.5 F (36.9 C)  Ht 5' 2.7" (1.593 m)  Wt 205 lb (92.987 kg)  BMI 36.64 kg/m2  SpO2 97%  Wt Readings from Last 3 Encounters:  05/02/15 205 lb (92.987 kg)  10/18/14 198 lb (89.812 kg)    Physical Exam  Results for orders placed or performed in visit on 08/13/12  Cancer antigen 27.29  Result Value Ref Range   CA 27.29 24.7 0.0-38.6 U/mL  CBC [facility]  Result Value Ref Range   WBC 6.9 3.6-11.0 x10 3/mm    RBC 4.14 3.80-5.20 x10 6/mm    HGB 14.0 12.0-16.0 g/dL   HCT 40.2 35.0-47.0 %   MCV 97 80-100 fL   MCH 33.7 26.0-34.0 pg   MCHC 34.8 32.0-36.0 g/dL   RDW 12.5 11.5-14.5 %   Platelet 181 150-440 x10 3/mm    Neutrophil % 63.8 %   Lymphocyte % 21.1 %   Monocyte % 10.9 %   Eosinophil % 3.2 %   Basophil % 1.0 %   Neutrophil # 4.4 1.4-6.5 x10 3/mm    Lymphocyte # 1.5 1.0-3.6 x10 3/mm    Monocyte # 0.8 0.2-0.9 x10 3/mm    Eosinophil # 0.2 0.0-0.7 x10 3/mm    Basophil # 0.1 0.0-0.1 x10 3/mm   Comprehensive metabolic panel  Result Value Ref Range   Glucose 113 (H) 65-99 mg/dL   BUN 18 7-18 mg/dL   Creatinine 0.65 0.60-1.30 mg/dL   Sodium 141 136-145 mmol/L   Potassium 4.3 3.5-5.1 mmol/L   Chloride 103 98-107 mmol/L   Co2 29 21-32 mmol/L   Calcium, Total 9.3 8.5-10.1 mg/dL   SGOT(AST) 18 15-37 Unit/L   SGPT (ALT) 26 12-78 U/L   Alkaline Phosphatase 71 50-136 Unit/L   Albumin 4.1 3.4-5.0 g/dL   Total Protein 7.4 6.4-8.2 g/dL   Bilirubin,Total 0.7 0.2-1.0 mg/dL   Osmolality 284 275-301   Anion Gap 9 7-16   EGFR (African American) >60    EGFR (Non-African Amer.) >60       Assessment & Plan:   Problem List Items Addressed This Visit  Cardiovascular and Mediastinum   Essential hypertension    Discuss hypertension Will continue current medications and observe blood pressure is too high today. If blood pressure not coming down we will need to increase medications.      Relevant Medications   amLODipine (NORVASC) 5 MG tablet   benazepril (LOTENSIN) 40 MG tablet   Other Relevant Orders   Comprehensive metabolic panel   Lipid panel   CBC with Differential/Platelet   Urinalysis, Routine w reflex microscopic (not at [facility])   TSH   Aortic valve stenosis, mild    Followed by cardiology and stable      Relevant Medications   amLODipine (NORVASC) 5 MG tablet   benazepril (LOTENSIN) 40 MG tablet   Other Relevant Orders   Comprehensive metabolic panel   Lipid panel   CBC with Differential/Platelet   Urinalysis, Routine w reflex microscopic (not at [facility])   TSH      Endocrine   Hyperthyroidism    Followed by endocrinology        Other   Breast cancer in female [facility])    Other Visit Diagnoses    Colon cancer screening    -  Primary    Relevant Orders    Ambulatory referral to General Surgery    PE (physical exam), annual            Follow up plan: Return in about 6 months (around 10/30/2015), or if symptoms worsen or fail to improve especially if blood pressure stays up, for Regular blood pressure check and BMP.

## 2015-05-02 NOTE — Assessment & Plan Note (Signed)
Followed by endocrinology 

## 2015-05-02 NOTE — Assessment & Plan Note (Signed)
Followed by cardiology and stable 

## 2015-05-03 LAB — CBC WITH DIFFERENTIAL/PLATELET
Basophils Absolute: 0.1 10*3/uL (ref 0.0–0.2)
Basos: 1 %
EOS (ABSOLUTE): 0.2 10*3/uL (ref 0.0–0.4)
Eos: 3 %
Hematocrit: 40.1 % (ref 34.0–46.6)
Hemoglobin: 13.9 g/dL (ref 11.1–15.9)
Immature Grans (Abs): 0 10*3/uL (ref 0.0–0.1)
Immature Granulocytes: 1 %
Lymphocytes Absolute: 2.2 10*3/uL (ref 0.7–3.1)
Lymphs: 25 %
MCH: 33.9 pg — ABNORMAL HIGH (ref 26.6–33.0)
MCHC: 34.7 g/dL (ref 31.5–35.7)
MCV: 98 fL — ABNORMAL HIGH (ref 79–97)
Monocytes Absolute: 0.7 10*3/uL (ref 0.1–0.9)
Monocytes: 8 %
Neutrophils Absolute: 5.5 10*3/uL (ref 1.4–7.0)
Neutrophils: 62 %
Platelets: 243 10*3/uL (ref 150–379)
RBC: 4.1 x10E6/uL (ref 3.77–5.28)
RDW: 12.9 % (ref 12.3–15.4)
WBC: 8.8 10*3/uL (ref 3.4–10.8)

## 2015-05-03 LAB — LIPID PANEL
Chol/HDL Ratio: 2.8 ratio units (ref 0.0–4.4)
Cholesterol, Total: 245 mg/dL — ABNORMAL HIGH (ref 100–199)
HDL: 86 mg/dL (ref >39)
LDL Calculated: 130 mg/dL — ABNORMAL HIGH (ref 0–99)
Triglycerides: 146 mg/dL (ref 0–149)
VLDL Cholesterol Cal: 29 mg/dL (ref 5–40)

## 2015-05-03 LAB — COMPREHENSIVE METABOLIC PANEL
ALT: 22 IU/L (ref 0–32)
AST: 26 IU/L (ref 0–40)
Albumin/Globulin Ratio: 2 (ref 1.1–2.5)
Albumin: 4.5 g/dL (ref 3.5–4.8)
Alkaline Phosphatase: 73 IU/L (ref 39–117)
BUN/Creatinine Ratio: 14 (ref 11–26)
BUN: 9 mg/dL (ref 8–27)
Bilirubin Total: 0.4 mg/dL (ref 0.0–1.2)
CO2: 24 mmol/L (ref 18–29)
Calcium: 9.4 mg/dL (ref 8.7–10.3)
Chloride: 99 mmol/L (ref 97–106)
Creatinine, Ser: 0.66 mg/dL (ref 0.57–1.00)
GFR calc Af Amer: 102 mL/min/{1.73_m2} (ref >59)
GFR calc non Af Amer: 89 mL/min/{1.73_m2} (ref >59)
Globulin, Total: 2.2 g/dL (ref 1.5–4.5)
Glucose: 103 mg/dL — ABNORMAL HIGH (ref 65–99)
Potassium: 4.1 mmol/L (ref 3.5–5.2)
Sodium: 140 mmol/L (ref 136–144)
Total Protein: 6.7 g/dL (ref 6.0–8.5)

## 2015-05-03 LAB — TSH: TSH: 14.57 u[IU]/mL — ABNORMAL HIGH (ref 0.450–4.500)

## 2015-05-04 ENCOUNTER — Other Ambulatory Visit: Payer: Self-pay

## 2015-05-04 ENCOUNTER — Telehealth: Payer: Self-pay | Admitting: Gastroenterology

## 2015-05-04 NOTE — Telephone Encounter (Signed)
Gastroenterology Pre-Procedure Review  Request Date:05-16-2015  Requesting Physician: Dr.   PATIENT REVIEW QUESTIONS: The patient responded to the following health history questions as indicated:    1. Are you having any GI issues? no 2. Do you have a personal history of Polyps? no 3. Do you have a family history of Colon Cancer or Polyps? no 4. Diabetes Mellitus? no 5. Joint replacements in the past 12 months?no 6. Major health problems in the past 3 months?no 7. Any artificial heart valves, MVP, or defibrillator?no    MEDICATIONS & ALLERGIES:    Patient reports the following regarding taking any anticoagulation/antiplatelet therapy:   Plavix, Coumadin, Eliquis, Xarelto, Lovenox, Pradaxa, Brilinta, or Effient? no Aspirin? yes ( )  Patient confirms/reports the following medications:  Current Outpatient Prescriptions  Medication Sig Dispense Refill   alendronate (FOSAMAX) 70 MG tablet Take 1 tablet (70 mg total) by mouth once a week. Take with a full glass of water on an empty stomach. 12 tablet 1   amLODipine (NORVASC) 5 MG tablet Take 1 tablet (5 mg total) by mouth daily. 30 tablet 12   aspirin EC 81 MG tablet Take 81 mg by mouth daily.     benazepril (LOTENSIN) 40 MG tablet Take 1 tablet (40 mg total) by mouth daily. 30 tablet 12   METHIMAZOLE PO Take 2.5 mg by mouth daily.     metoprolol succinate (TOPROL-XL) 25 MG 24 hr tablet Take 25 mg by mouth daily.     Multiple Vitamin (MULTI-VITAMINS) TABS Take by mouth.     Omega-3 Fatty Acids (FISH OIL) 1000 MG CAPS Take by mouth.     Probiotic Product (PROBIOTIC ADVANCED PO) Take by mouth daily.     No current facility-administered medications for this visit.    Patient confirms/reports the following allergies:  No Known Allergies  No orders of the defined types were placed in this encounter.    AUTHORIZATION INFORMATION Primary Insurance: 1D#: Group #:  Secondary Insurance: 1D#: Group #:  SCHEDULE  INFORMATION: Date: 05-16-2015 Time: Location:[facility]

## 2015-05-05 ENCOUNTER — Telehealth: Payer: Self-pay

## 2015-05-05 ENCOUNTER — Other Ambulatory Visit: Payer: Self-pay | Admitting: Family Medicine

## 2015-05-05 ENCOUNTER — Telehealth: Payer: Self-pay | Admitting: Gastroenterology

## 2015-05-05 NOTE — Telephone Encounter (Signed)
Patient decided not to have colonoscopy, Called [facility] and cancelled

## 2015-05-05 NOTE — Telephone Encounter (Signed)
Patient called in this morning and states that she spoke with someone yesterday in our office and scheduled a Colonoscopy for next week. She has decided that she will not go through with this now. She did not indicate why.

## 2015-05-09 NOTE — Telephone Encounter (Signed)
Spoke with Sharyn Lull and this procedure has been cancelled per patient request.

## 2015-05-16 ENCOUNTER — Ambulatory Visit: Admit: 2015-05-16 | Payer: Self-pay | Admitting: Gastroenterology

## 2015-05-16 SURGERY — COLONOSCOPY WITH PROPOFOL
Anesthesia: Choice

## 2015-07-15 DIAGNOSIS — H2513 Age-related nuclear cataract, bilateral: Secondary | ICD-10-CM | POA: Diagnosis not present

## 2015-07-18 ENCOUNTER — Telehealth: Payer: Self-pay | Admitting: Family Medicine

## 2015-07-18 MED ORDER — VALACYCLOVIR HCL 1 G PO TABS: 1000.0000 mg | ORAL_TABLET | Freq: Three times a day (TID) | ORAL | Status: DC

## 2015-07-18 NOTE — Telephone Encounter (Signed)
Pt called and has developed shingles over the weekend and would like to know if she has to come in. I offered an a appt tomorrow but pt would rather have a call back.

## 2015-07-18 NOTE — Telephone Encounter (Signed)
Patient's had shingles vaccines but developed classic shingles rash developed between 24 and 48 hours ago the patient had some symptoms prior Will start medication in any case

## 2015-08-11 DIAGNOSIS — Z853 Personal history of malignant neoplasm of breast: Secondary | ICD-10-CM | POA: Diagnosis not present

## 2015-08-11 DIAGNOSIS — H268 Other specified cataract: Secondary | ICD-10-CM | POA: Diagnosis not present

## 2015-08-11 DIAGNOSIS — Z7982 Long term (current) use of aspirin: Secondary | ICD-10-CM | POA: Diagnosis not present

## 2015-08-11 DIAGNOSIS — M810 Age-related osteoporosis without current pathological fracture: Secondary | ICD-10-CM | POA: Diagnosis not present

## 2015-08-11 DIAGNOSIS — H2512 Age-related nuclear cataract, left eye: Secondary | ICD-10-CM | POA: Diagnosis not present

## 2015-08-11 DIAGNOSIS — H52222 Regular astigmatism, left eye: Secondary | ICD-10-CM | POA: Diagnosis not present

## 2015-08-11 DIAGNOSIS — I10 Essential (primary) hypertension: Secondary | ICD-10-CM | POA: Diagnosis not present

## 2015-08-11 DIAGNOSIS — E669 Obesity, unspecified: Secondary | ICD-10-CM | POA: Diagnosis not present

## 2015-08-11 DIAGNOSIS — F17290 Nicotine dependence, other tobacco product, uncomplicated: Secondary | ICD-10-CM | POA: Diagnosis not present

## 2015-08-11 DIAGNOSIS — Z79899 Other long term (current) drug therapy: Secondary | ICD-10-CM | POA: Diagnosis not present

## 2015-08-11 DIAGNOSIS — H2513 Age-related nuclear cataract, bilateral: Secondary | ICD-10-CM | POA: Diagnosis not present

## 2015-08-22 ENCOUNTER — Other Ambulatory Visit: Payer: Self-pay | Admitting: *Deleted

## 2015-08-22 DIAGNOSIS — E0500 Thyrotoxicosis with diffuse goiter without thyrotoxic crisis or storm: Secondary | ICD-10-CM | POA: Diagnosis not present

## 2015-08-22 MED ORDER — ALENDRONATE SODIUM 70 MG PO TABS: 70.0000 mg | ORAL_TABLET | ORAL | Status: DC

## 2015-08-25 ENCOUNTER — Encounter: Payer: Self-pay | Admitting: *Deleted

## 2015-08-25 DIAGNOSIS — I10 Essential (primary) hypertension: Secondary | ICD-10-CM | POA: Diagnosis not present

## 2015-08-25 DIAGNOSIS — Z9842 Cataract extraction status, left eye: Secondary | ICD-10-CM | POA: Diagnosis not present

## 2015-08-25 DIAGNOSIS — H52223 Regular astigmatism, bilateral: Secondary | ICD-10-CM | POA: Diagnosis not present

## 2015-08-25 DIAGNOSIS — Z791 Long term (current) use of non-steroidal anti-inflammatories (NSAID): Secondary | ICD-10-CM | POA: Diagnosis not present

## 2015-08-25 DIAGNOSIS — H2511 Age-related nuclear cataract, right eye: Secondary | ICD-10-CM | POA: Diagnosis not present

## 2015-08-25 DIAGNOSIS — Z7982 Long term (current) use of aspirin: Secondary | ICD-10-CM | POA: Diagnosis not present

## 2015-08-25 DIAGNOSIS — H52221 Regular astigmatism, right eye: Secondary | ICD-10-CM | POA: Diagnosis not present

## 2015-08-25 DIAGNOSIS — Z961 Presence of intraocular lens: Secondary | ICD-10-CM | POA: Diagnosis not present

## 2015-08-25 DIAGNOSIS — Z79899 Other long term (current) drug therapy: Secondary | ICD-10-CM | POA: Diagnosis not present

## 2015-08-25 DIAGNOSIS — E0500 Thyrotoxicosis with diffuse goiter without thyrotoxic crisis or storm: Secondary | ICD-10-CM | POA: Diagnosis not present

## 2015-09-27 ENCOUNTER — Inpatient Hospital Stay: Payer: Medicare Other | Admitting: Oncology

## 2015-10-18 ENCOUNTER — Inpatient Hospital Stay: Payer: Medicare Other | Attending: Oncology | Admitting: Oncology

## 2015-10-18 VITALS — BP 147/55 | HR 87 | Temp 97.9°F | Resp 18 | Wt 202.4 lb

## 2015-10-18 DIAGNOSIS — M8580 Other specified disorders of bone density and structure, unspecified site: Secondary | ICD-10-CM | POA: Diagnosis not present

## 2015-10-18 DIAGNOSIS — Z853 Personal history of malignant neoplasm of breast: Secondary | ICD-10-CM | POA: Diagnosis not present

## 2015-10-18 DIAGNOSIS — Z85828 Personal history of other malignant neoplasm of skin: Secondary | ICD-10-CM | POA: Insufficient documentation

## 2015-10-18 DIAGNOSIS — Z803 Family history of malignant neoplasm of breast: Secondary | ICD-10-CM | POA: Diagnosis not present

## 2015-10-18 DIAGNOSIS — C50211 Malignant neoplasm of upper-inner quadrant of right female breast: Secondary | ICD-10-CM

## 2015-10-18 DIAGNOSIS — Z7982 Long term (current) use of aspirin: Secondary | ICD-10-CM | POA: Diagnosis not present

## 2015-10-18 DIAGNOSIS — Z79899 Other long term (current) drug therapy: Secondary | ICD-10-CM | POA: Diagnosis not present

## 2015-10-18 DIAGNOSIS — Z809 Family history of malignant neoplasm, unspecified: Secondary | ICD-10-CM

## 2015-10-18 NOTE — Progress Notes (Signed)
Patient does not offer any problems today.  

## 2015-10-18 NOTE — Progress Notes (Signed)
Maria Davies  Telephone:([phone]) [phone] Fax:([phone]) [phone]  ID: [name] OB: [birth year]  MR#: [id number]  QRF#:[id number]  Patient Care Team: Guadalupe Maple, MD as PCP - General (Family Medicine) Guadalupe Maple, MD as PCP - Family Medicine (Family Medicine)  CHIEF COMPLAINT:  Chief Complaint  Patient presents with  . Breast Cancer    INTERVAL HISTORY: Patient returns to clinic for her yearly routine follow-up. She continues to feel well and remains asymptomatic. She has now completed 5 years of anastrozole.  She currently feels well and is asymptomatic.  She has no recent fevers or illnesses.  She has no neurologic complaints.  She has a good appetite and denies weight loss.  She has no chest pain or shortness of breath.  She denies any nausea, vomiting, constipation, or diarrhea.  She has no urinary complaints.  Patient offers no specific complaints today.  REVIEW OF SYSTEMS:   Review of Systems  Constitutional: Negative for fever, weight loss and malaise/fatigue.  Respiratory: Negative.   Cardiovascular: Negative.   Gastrointestinal: Negative.   Genitourinary: Negative.   Musculoskeletal: Negative.   Neurological: Negative.  Negative for weakness.  Psychiatric/Behavioral: Negative.     As per HPI. Otherwise, a complete review of systems is negatve.  PAST MEDICAL HISTORY: Past Medical History  Diagnosis Date  . Osteopenia   . Cancer ([place])     rt breast  . Basal cell carcinoma     back    PAST SURGICAL HISTORY: Past Surgical History  Procedure Laterality Date  . Cholecystectomy    . Breast surgery    . Back surgery    . Breast excisional biopsy Right 03/2009    radiation and chemo +  . Breast excisional biopsy Bilateral     bilat -    FAMILY HISTORY Family History  Problem Relation Age of Onset  . Cancer Mother   . Heart disease Father   . Breast cancer Paternal Aunt 41       ADVANCED DIRECTIVES:    HEALTH MAINTENANCE: Social  History  Substance Use Topics  . Smoking status: Never Smoker   . Smokeless tobacco: Never Used  . Alcohol Use: Yes     Colonoscopy:  PAP:  Bone density:  Lipid panel:  No Known Allergies  Current Outpatient Prescriptions  Medication Sig Dispense Refill  . alendronate (FOSAMAX) 70 MG tablet Take 1 tablet (70 mg total) by mouth once a week. Take with a full glass of water on an empty stomach. 12 tablet 0  . amLODipine (NORVASC) 5 MG tablet Take 1 tablet (5 mg total) by mouth daily. 30 tablet 12  . aspirin EC 81 MG tablet Take 81 mg by mouth daily.    . benazepril (LOTENSIN) 40 MG tablet Take 1 tablet (40 mg total) by mouth daily. 30 tablet 12  . metoprolol succinate (TOPROL-XL) 25 MG 24 hr tablet TAKE 1 TABLET BY MOUTH ONCE DAILY 30 tablet 12  . Multiple Vitamin (MULTI-VITAMINS) TABS Take by mouth.    . Omega-3 Fatty Acids (FISH OIL) 1000 MG CAPS Take by mouth.    . Probiotic Product (PROBIOTIC ADVANCED PO) Take by mouth daily.    Marland Kitchen METHIMAZOLE PO Take 2.5 mg by mouth daily. Reported on 10/18/2015     No current facility-administered medications for this visit.    OBJECTIVE: Filed Vitals:   10/18/15 1111  BP: 147/55  Pulse: 87  Temp: 97.9 F (36.6 C)  Resp: 18     Body mass  index is 36.18 kg/(m^2).    ECOG FS:0 - Asymptomatic  General: Well-developed, well-nourished, no acute distress. Eyes: Pink conjunctiva, anicteric sclera. Breasts: Patient reports a normal breast exam by Dr. Tamala Julian recently. Lungs: Clear to auscultation bilaterally. Heart: Regular rate and rhythm. No rubs, murmurs, or gallops. Abdomen: Soft, nontender, nondistended. No organomegaly noted, normoactive bowel sounds. Musculoskeletal: No edema, cyanosis, or clubbing. Neuro: Alert, answering all questions appropriately. Cranial nerves grossly intact. Skin: No rashes or petechiae noted. Psych: Normal affect.  LAB RESULTS:  Lab Results  Component Value Date   NA 140 05/02/2015   K 4.1 05/02/2015   CL  99 05/02/2015   CO2 24 05/02/2015   GLUCOSE 103* 05/02/2015   BUN 9 05/02/2015   CREATININE 0.66 05/02/2015   CALCIUM 9.4 05/02/2015   PROT 6.7 05/02/2015   ALBUMIN 4.5 05/02/2015   AST 26 05/02/2015   ALT 22 05/02/2015   ALKPHOS 73 05/02/2015   BILITOT 0.4 05/02/2015   GFRNONAA 89 05/02/2015   GFRAA 102 05/02/2015    Lab Results  Component Value Date   WBC 8.8 05/02/2015   NEUTROABS 5.5 05/02/2015   HGB 14.0 08/15/2012   HCT 40.1 05/02/2015   MCV 98* 05/02/2015   PLT 243 05/02/2015     STUDIES: No results found.  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  PLAN:    1.  Breast cancer:  No evidence of disease.  Patient's most recent mammogram on December 29, 2014 was reported as BI-RADS 2.  Repeat in July 2017. Patient completed 5 years of anastrozole in June 2016. After lengthy discussion with the patient, it was decided that no further follow-up is necessary. She will continue routine follow-up with surgery as well as with primary care. Mammogram has been ordered for this year, but these can now subsequently be ordered by primary care. No further intervention is needed at this time. No follow-up has been made.   2.  Osteoporosis:  Bone mineral density testing performed on Oct 22, 2013 was significantly improved with a T score of -1.7. Continue calcium, vitamin D and Fosamax.  Patient expressed understanding and was in agreement with this plan. She also understands that She can call clinic at any time with any questions, concerns, or complaints.   Lloyd Huger, MD   10/18/2015 11:18 AM

## 2015-10-28 DIAGNOSIS — E0500 Thyrotoxicosis with diffuse goiter without thyrotoxic crisis or storm: Secondary | ICD-10-CM | POA: Diagnosis not present

## 2015-11-03 ENCOUNTER — Encounter: Payer: Self-pay | Admitting: Family Medicine

## 2015-11-03 ENCOUNTER — Ambulatory Visit (INDEPENDENT_AMBULATORY_CARE_PROVIDER_SITE_OTHER): Payer: Medicare Other | Admitting: Family Medicine

## 2015-11-03 VITALS — BP 121/79 | HR 114 | Temp 98.5°F | Ht 62.0 in | Wt 201.8 lb

## 2015-11-03 DIAGNOSIS — J019 Acute sinusitis, unspecified: Secondary | ICD-10-CM | POA: Diagnosis not present

## 2015-11-03 MED ORDER — HYDROCOD POLST-CPM POLST ER 10-8 MG/5ML PO SUER: 2.5000 mL | Freq: Two times a day (BID) | ORAL | Status: DC | PRN

## 2015-11-03 MED ORDER — AMOXICILLIN-POT CLAVULANATE 875-125 MG PO TABS: 1.0000 | ORAL_TABLET | Freq: Two times a day (BID) | ORAL | Status: DC

## 2015-11-03 NOTE — Progress Notes (Signed)
BP 121/79 mmHg  Pulse 114  Temp(Src) 98.5 F (36.9 C)  Ht 5\' 2"  (1.575 m)  Wt 201 lb 12.8 oz (91.536 kg)  BMI 36.90 kg/m2  SpO2 96%   Subjective:    Patient ID: Maria Davies, female    DOB: [birth year], 75 y.o.   MRN: [id number]  HPI: Maria Davies is a 75 y.o. female  Chief Complaint  Patient presents with  . Sore Throat  . Cough  . Headache  . Nasal Congestion   Patient with marked systemic symptoms just feeling bad nasal congestion cough headache sore throat no fever but some chills no known tick exposure having had pressure and congestion especially sloshing-type sensation with bending this is been ongoing for 5 days. And this is getting worse. Relevant past medical, surgical, family and social history reviewed and updated as indicated. Interim medical history since our last visit reviewed. Allergies and medications reviewed and updated.  Review of Systems  Constitutional: Positive for chills, diaphoresis and fatigue. Negative for fever.  HENT: Positive for congestion, rhinorrhea, sinus pressure, sneezing, sore throat and trouble swallowing.   Respiratory: Positive for cough.   Cardiovascular: Negative.     Per HPI unless specifically indicated above     Objective:    BP 121/79 mmHg  Pulse 114  Temp(Src) 98.5 F (36.9 C)  Ht 5\' 2"  (1.575 m)  Wt 201 lb 12.8 oz (91.536 kg)  BMI 36.90 kg/m2  SpO2 96%  Wt Readings from Last 3 Encounters:  11/03/15 201 lb 12.8 oz (91.536 kg)  10/18/15 202 lb 6.1 oz (91.8 kg)  05/02/15 205 lb (92.987 kg)    Physical Exam  Constitutional: She is oriented to person, place, and time. She appears well-developed and well-nourished. No distress.  HENT:  Head: Normocephalic and atraumatic.  Right Ear: Hearing and external ear normal.  Left Ear: Hearing and external ear normal.  Nose: Nose normal.  Mouth/Throat: Oropharyngeal exudate present.  Eyes: Conjunctivae and lids are normal. Right eye exhibits no discharge. Left eye  exhibits no discharge. No scleral icterus.  Cardiovascular: Normal rate, regular rhythm and normal heart sounds.   Pulmonary/Chest: Effort normal and breath sounds normal. No respiratory distress.  Musculoskeletal: Normal range of motion.  Lymphadenopathy:    She has no cervical adenopathy.  Neurological: She is alert and oriented to person, place, and time.  Skin: Skin is intact. No rash noted.  Psychiatric: She has a normal mood and affect. Her speech is normal and behavior is normal. Judgment and thought content normal. Cognition and memory are normal.    Results for orders placed or performed in visit on 05/02/15  Comprehensive metabolic panel  Result Value Ref Range   Glucose 103 (H) 65 - 99 mg/dL   BUN 9 8 - 27 mg/dL   Creatinine, Ser 0.66 0.57 - 1.00 mg/dL   GFR calc non Af Amer 89 >59 mL/min/1.73   GFR calc Af Amer 102 >59 mL/min/1.73   BUN/Creatinine Ratio 14 11 - 26   Sodium 140 136 - 144 mmol/L   Potassium 4.1 3.5 - 5.2 mmol/L   Chloride 99 97 - 106 mmol/L   CO2 24 18 - 29 mmol/L   Calcium 9.4 8.7 - 10.3 mg/dL   Total Protein 6.7 6.0 - 8.5 g/dL   Albumin 4.5 3.5 - 4.8 g/dL   Globulin, Total 2.2 1.5 - 4.5 g/dL   Albumin/Globulin Ratio 2.0 1.1 - 2.5   Bilirubin Total 0.4 0.0 - 1.2 mg/dL   Alkaline  Phosphatase 73 39 - 117 IU/L   AST 26 0 - 40 IU/L   ALT 22 0 - 32 IU/L  Lipid panel  Result Value Ref Range   Cholesterol, Total 245 (H) 100 - 199 mg/dL   Triglycerides 146 0 - 149 mg/dL   HDL 86 >39 mg/dL   VLDL Cholesterol Cal 29 5 - 40 mg/dL   LDL Calculated 130 (H) 0 - 99 mg/dL   Chol/HDL Ratio 2.8 0.0 - 4.4 ratio units  CBC with Differential/Platelet  Result Value Ref Range   WBC 8.8 3.4 - 10.8 x10E3/uL   RBC 4.10 3.77 - 5.28 x10E6/uL   Hemoglobin 13.9 11.1 - 15.9 g/dL   Hematocrit 40.1 34.0 - 46.6 %   MCV 98 (H) 79 - 97 fL   MCH 33.9 (H) 26.6 - 33.0 pg   MCHC 34.7 31.5 - 35.7 g/dL   RDW 12.9 12.3 - 15.4 %   Platelets 243 150 - 379 x10E3/uL   Neutrophils 62  %   Lymphs 25 %   Monocytes 8 %   Eos 3 %   Basos 1 %   Neutrophils Absolute 5.5 1.4 - 7.0 x10E3/uL   Lymphocytes Absolute 2.2 0.7 - 3.1 x10E3/uL   Monocytes Absolute 0.7 0.1 - 0.9 x10E3/uL   EOS (ABSOLUTE) 0.2 0.0 - 0.4 x10E3/uL   Basophils Absolute 0.1 0.0 - 0.2 x10E3/uL   Immature Granulocytes 1 %   Immature Grans (Abs) 0.0 0.0 - 0.1 x10E3/uL  Urinalysis, Routine w reflex microscopic (not at [facility])  Result Value Ref Range   Specific Gravity, UA 1.005 1.005 - 1.030   pH, UA 7.0 5.0 - 7.5   Color, UA Yellow Yellow   Appearance Ur Clear Clear   Leukocytes, UA Negative Negative   Protein, UA Negative Negative/Trace   Glucose, UA Negative Negative   Ketones, UA Negative Negative   RBC, UA Negative Negative   Bilirubin, UA Negative Negative   Urobilinogen, Ur 0.2 0.2 - 1.0 mg/dL   Nitrite, UA Negative Negative  TSH  Result Value Ref Range   TSH 14.570 (H) 0.450 - 4.500 uIU/mL      Assessment & Plan:   Problem List Items Addressed This Visit    None    Visit Diagnoses    Acute sinusitis, recurrence not specified, unspecified location    -  Primary    Discussed sinusitis care and treatment use of medication side effects and cautions about Tussionex OTC meds Tylenol etc.    Relevant Medications    amoxicillin-clavulanate (AUGMENTIN) 875-125 MG tablet    chlorpheniramine-HYDROcodone (TUSSIONEX PENNKINETIC ER) 10-8 MG/5ML SUER        Follow up plan: Return for As scheduled.

## 2015-11-04 DIAGNOSIS — Z8639 Personal history of other endocrine, nutritional and metabolic disease: Secondary | ICD-10-CM | POA: Diagnosis not present

## 2015-11-11 ENCOUNTER — Other Ambulatory Visit: Payer: Self-pay | Admitting: *Deleted

## 2015-11-11 ENCOUNTER — Other Ambulatory Visit: Payer: Self-pay

## 2015-11-11 NOTE — Telephone Encounter (Signed)
Pharmacy requesting refill for alendronate.

## 2015-11-11 NOTE — Telephone Encounter (Signed)
Defer to PCP

## 2015-11-15 MED ORDER — ALENDRONATE SODIUM 70 MG PO TABS: 70.0000 mg | ORAL_TABLET | ORAL | Status: DC

## 2015-12-02 DIAGNOSIS — Z1283 Encounter for screening for malignant neoplasm of skin: Secondary | ICD-10-CM | POA: Diagnosis not present

## 2015-12-02 DIAGNOSIS — Z08 Encounter for follow-up examination after completed treatment for malignant neoplasm: Secondary | ICD-10-CM | POA: Diagnosis not present

## 2015-12-02 DIAGNOSIS — Z85828 Personal history of other malignant neoplasm of skin: Secondary | ICD-10-CM | POA: Diagnosis not present

## 2016-01-02 ENCOUNTER — Ambulatory Visit
Admission: RE | Admit: 2016-01-02 | Discharge: 2016-01-02 | Disposition: A | Discharge status: Home / Self Care | Payer: Medicare Other | Source: Ambulatory Visit | Attending: Oncology | Admitting: Oncology

## 2016-01-02 ENCOUNTER — Other Ambulatory Visit: Payer: Self-pay | Admitting: Oncology

## 2016-01-02 DIAGNOSIS — R922 Inconclusive mammogram: Secondary | ICD-10-CM | POA: Diagnosis not present

## 2016-01-02 DIAGNOSIS — C50211 Malignant neoplasm of upper-inner quadrant of right female breast: Secondary | ICD-10-CM | POA: Insufficient documentation

## 2016-01-02 HISTORY — DX: Malignant neoplasm of unspecified site of unspecified female breast: C50.919

## 2016-01-02 IMAGING — MG MM DIGITAL DIAGNOSTIC BILAT W/ TOMO W/ CAD
8 of 13 series · 8 of 29 positions shown · non-contrast
Comparison: Previous exam(s).

CLINICAL DATA: 75-year-old female with history of right lumpectomy
in [R0]. The patient received chemotherapy and radiation.

EXAM:
2D DIGITAL DIAGNOSTIC BILATERAL MAMMOGRAM WITH CAD AND ADJUNCT TOMO

[R CC (1 of 2)]
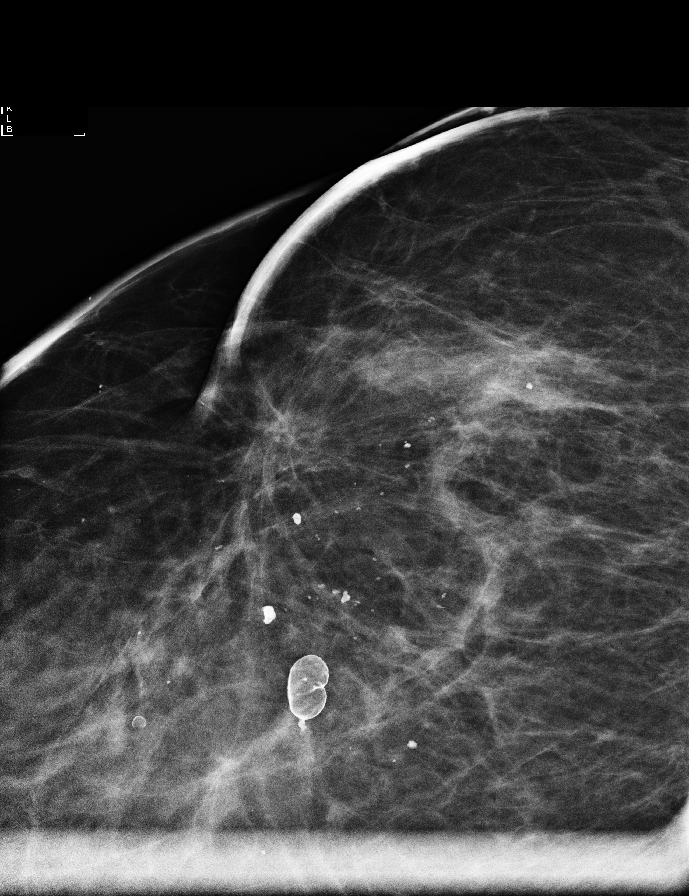

[L CC]
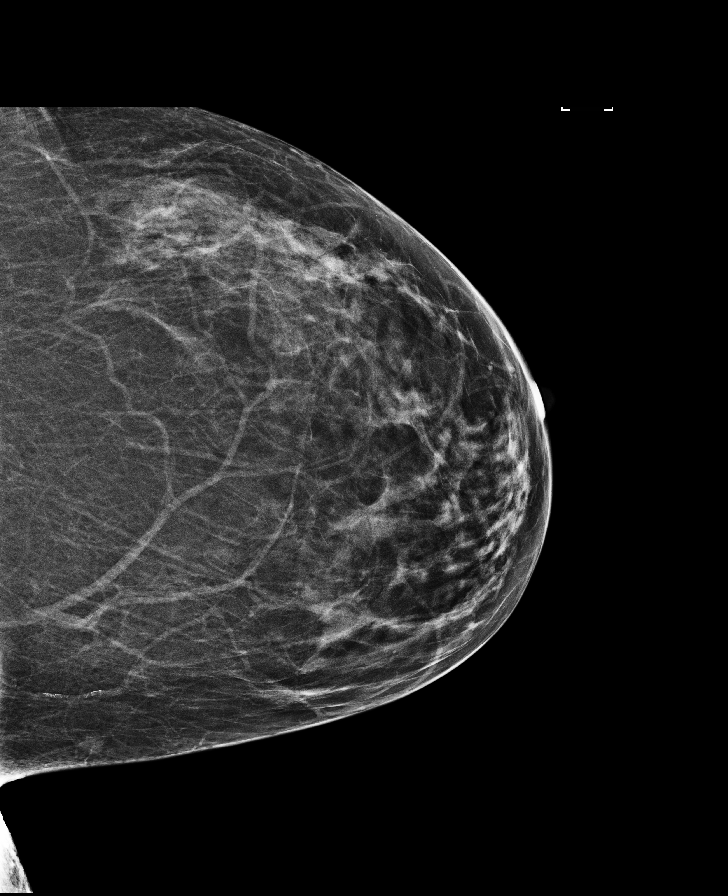

[L MLO synth-2D]
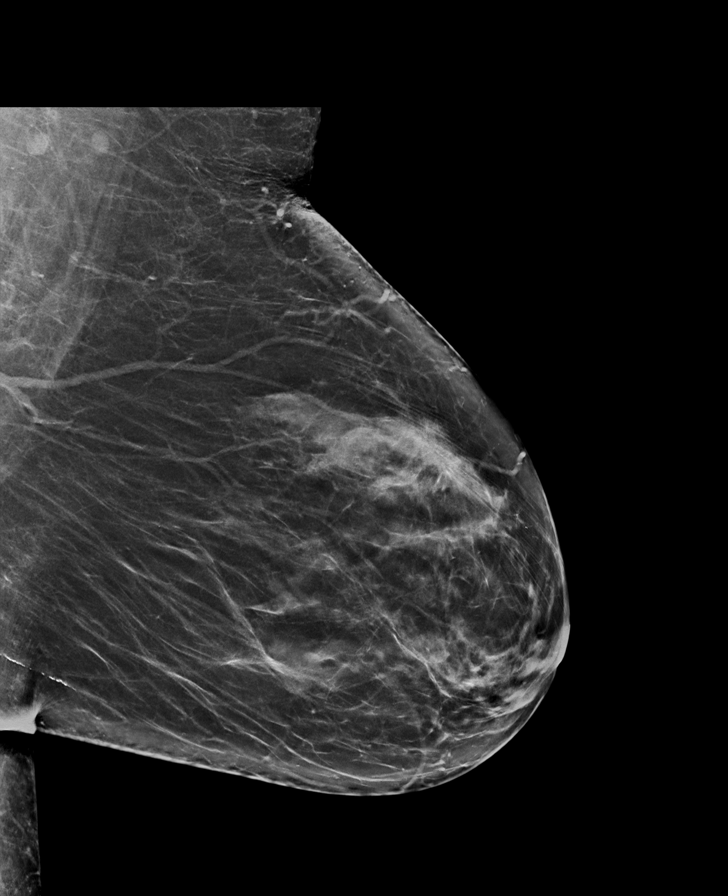

[R CC synth-2D]
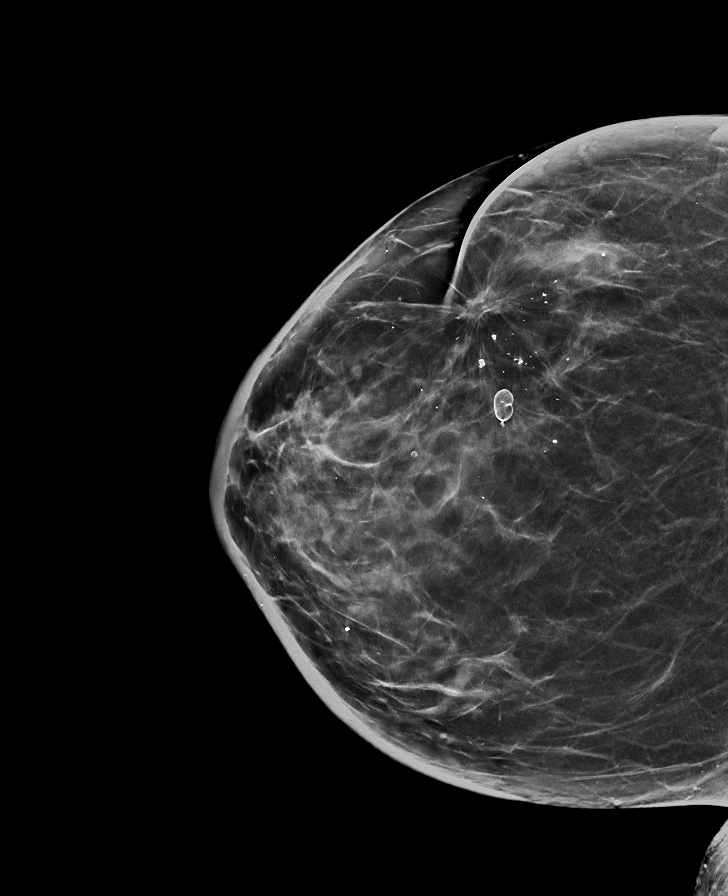

[L MLO]
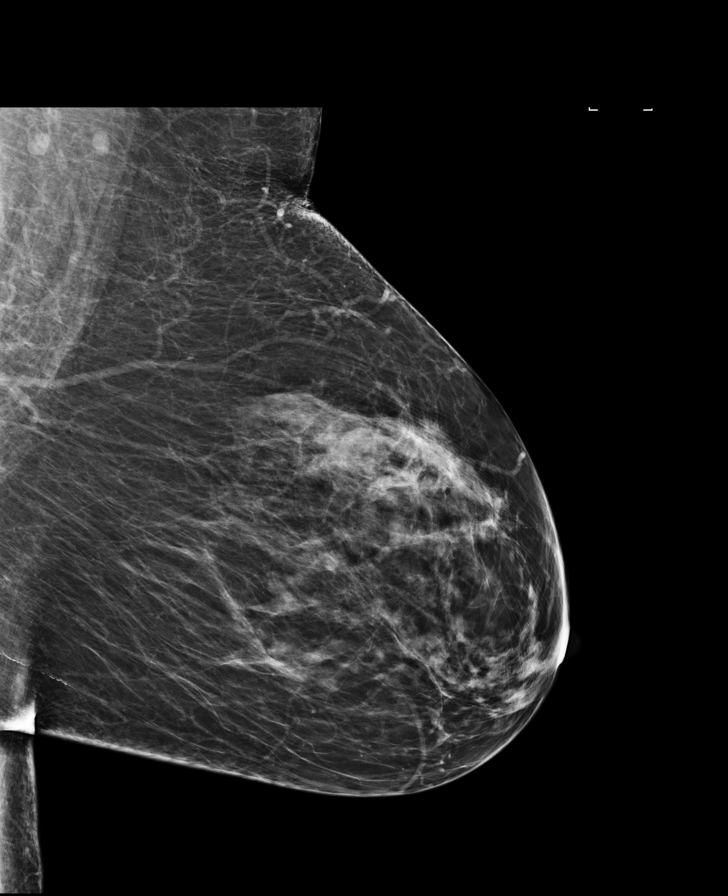

[R MLO synth-2D]
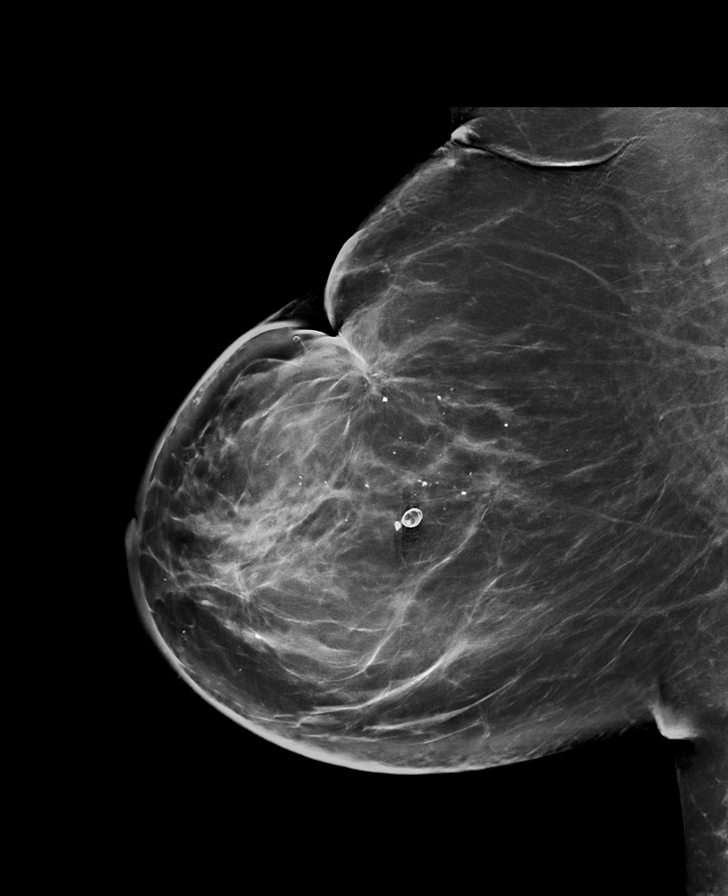

[R CC (2 of 2)]
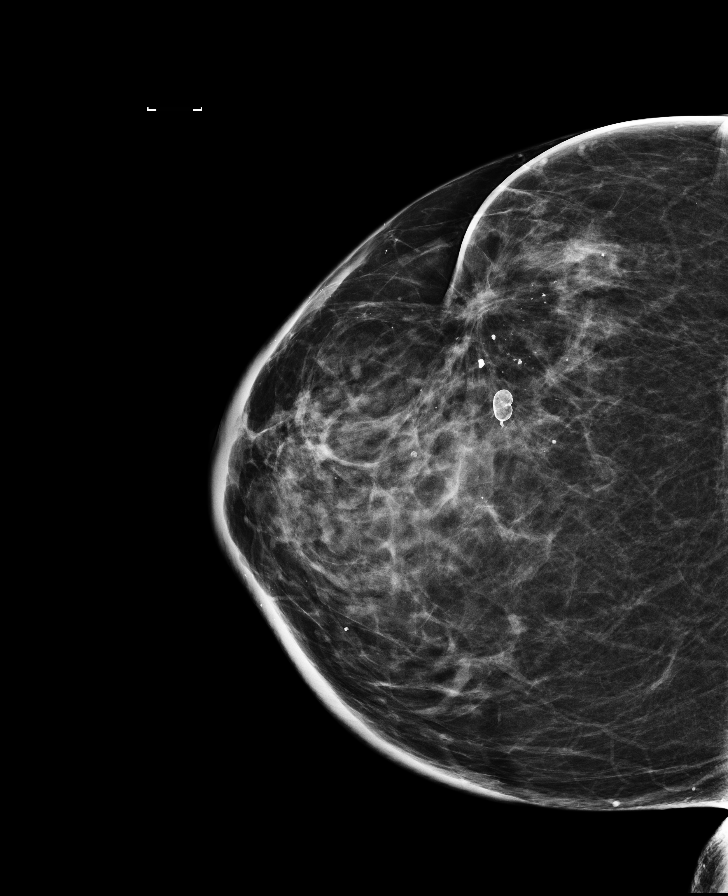

[L CC synth-2D]
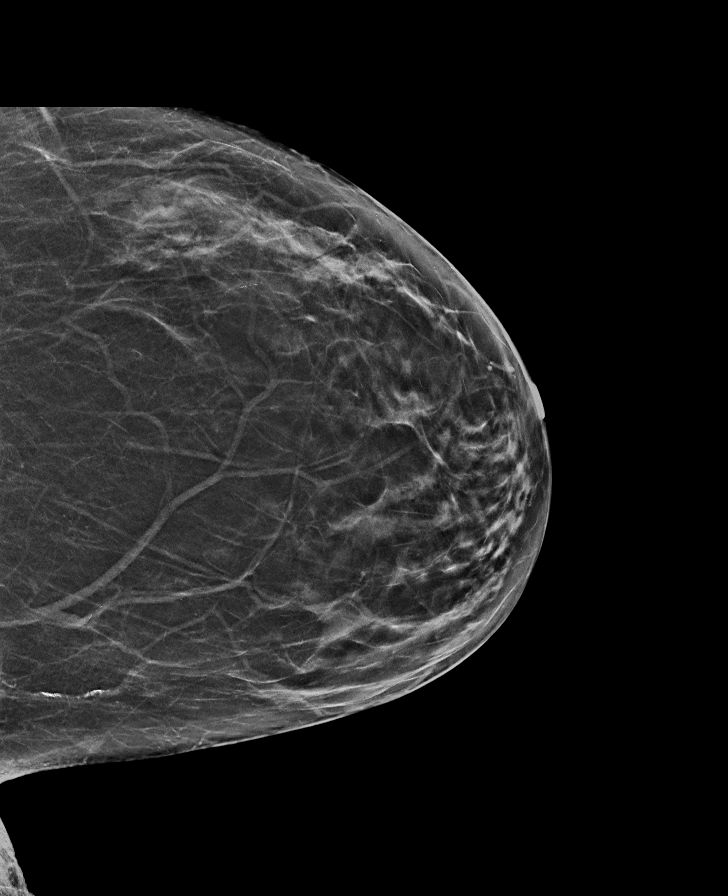

[8 of 29 positions shown; findings below may reference images not displayed]

ACR Breast Density Category c: The breast tissue is heterogeneously
dense, which may obscure small masses.
FINDINGS: Postlumpectomy changes are again noted of the right breast. No
suspicious mass, calcifications, or nonsurgical architectural
distortion is noted within either breast.

Mammographic images were processed with CAD.
IMPRESSION: No mammographic evidence of malignancy.

RECOMMENDATION:
Screening mammogram in one year.(Code:[R0])

I have discussed the findings and recommendations with the patient.
Results were also provided in writing at the conclusion of the
visit. If applicable, a reminder letter will be sent to the patient
regarding the next appointment.

BI-RADS CATEGORY  2: Benign.

## 2016-01-09 DIAGNOSIS — Z853 Personal history of malignant neoplasm of breast: Secondary | ICD-10-CM | POA: Diagnosis not present

## 2016-04-23 ENCOUNTER — Ambulatory Visit (INDEPENDENT_AMBULATORY_CARE_PROVIDER_SITE_OTHER): Payer: Medicare Other

## 2016-04-23 DIAGNOSIS — Z23 Encounter for immunization: Secondary | ICD-10-CM

## 2016-05-08 ENCOUNTER — Encounter: Payer: Self-pay | Admitting: Family Medicine

## 2016-05-23 ENCOUNTER — Other Ambulatory Visit: Payer: Self-pay | Admitting: Family Medicine

## 2016-05-29 ENCOUNTER — Other Ambulatory Visit: Payer: Self-pay | Admitting: Family Medicine

## 2016-06-19 DIAGNOSIS — C50911 Malignant neoplasm of unspecified site of right female breast: Secondary | ICD-10-CM | POA: Insufficient documentation

## 2016-07-03 ENCOUNTER — Ambulatory Visit (INDEPENDENT_AMBULATORY_CARE_PROVIDER_SITE_OTHER): Payer: Medicare Other | Admitting: Family Medicine

## 2016-07-03 VITALS — BP 138/80 | HR 90 | Ht 62.6 in | Wt 207.0 lb

## 2016-07-03 DIAGNOSIS — Z13220 Encounter for screening for lipoid disorders: Secondary | ICD-10-CM

## 2016-07-03 DIAGNOSIS — I10 Essential (primary) hypertension: Secondary | ICD-10-CM

## 2016-07-03 DIAGNOSIS — E0590 Thyrotoxicosis, unspecified without thyrotoxic crisis or storm: Secondary | ICD-10-CM

## 2016-07-03 DIAGNOSIS — E7800 Pure hypercholesterolemia, unspecified: Secondary | ICD-10-CM

## 2016-07-03 DIAGNOSIS — Z23 Encounter for immunization: Secondary | ICD-10-CM | POA: Diagnosis not present

## 2016-07-03 DIAGNOSIS — M810 Age-related osteoporosis without current pathological fracture: Secondary | ICD-10-CM | POA: Insufficient documentation

## 2016-07-03 DIAGNOSIS — Z0000 Encounter for general adult medical examination without abnormal findings: Secondary | ICD-10-CM

## 2016-07-03 DIAGNOSIS — I350 Nonrheumatic aortic (valve) stenosis: Secondary | ICD-10-CM | POA: Diagnosis not present

## 2016-07-03 LAB — URINALYSIS, ROUTINE W REFLEX MICROSCOPIC
Bilirubin, UA: NEGATIVE
Glucose, UA: NEGATIVE
Leukocytes, UA: NEGATIVE
Nitrite, UA: NEGATIVE
Protein, UA: NEGATIVE
RBC, UA: NEGATIVE
Specific Gravity, UA: 1.01 (ref 1.005–1.030)
Urobilinogen, Ur: 0.2 mg/dL (ref 0.2–1.0)
pH, UA: 7 (ref 5.0–7.5)

## 2016-07-03 LAB — MICROSCOPIC EXAMINATION
Bacteria, UA: NONE SEEN
RBC, UA: NONE SEEN /hpf (ref 0–?)
WBC, UA: NONE SEEN /hpf (ref 0–?)

## 2016-07-03 MED ORDER — AMLODIPINE BESYLATE 5 MG PO TABS: 5.0000 mg | ORAL_TABLET | Freq: Every day | ORAL | 12 refills | Status: DC

## 2016-07-03 MED ORDER — BENAZEPRIL HCL 40 MG PO TABS
40.0000 mg | ORAL_TABLET | Freq: Every day | ORAL | 12 refills | Status: DC
Start: 2016-07-03 — End: 2017-07-19

## 2016-07-03 MED ORDER — METOPROLOL SUCCINATE ER 25 MG PO TB24: 25.0000 mg | ORAL_TABLET | Freq: Every day | ORAL | 12 refills | Status: DC

## 2016-07-03 NOTE — Progress Notes (Signed)
BP 138/80 (BP Location: Left Arm)   Pulse 90   Ht 5' 2.6" (1.59 m)   Wt 207 lb (93.9 kg)   SpO2 99%   BMI 37.14 kg/m    Subjective:    Patient ID: Maria Davies, female    DOB: 05-10-1941, 76 y.o.   MRN: [id number]  HPI: Maria Davies is a 76 y.o. female  Chief Complaint  Patient presents with  . Annual Exam  Patient's endocrinologist has stopped thyroid medication and for follow-up of TSH here after 3 months being off medication will check TSH today. Patient's blood pressure doing okay is usually elevated here in this office but otherwise does okay takes medications without problems or issues. For bone density last bone density on chart review was 3 years ago. Patient's been on Fosamax long-term at least 7 years Will look to stop medication reviewed with patient.   Relevant past medical, surgical, family and social history reviewed and updated as indicated. Interim medical history since our last visit reviewed. Allergies and medications reviewed and updated.  Review of Systems  Constitutional: Negative.   HENT: Negative.   Eyes: Negative.   Respiratory: Negative.   Cardiovascular: Negative.   Gastrointestinal: Negative.   Endocrine: Negative.   Genitourinary: Negative.   Musculoskeletal: Negative.   Skin: Negative.   Allergic/Immunologic: Negative.   Neurological: Negative.   Hematological: Negative.   Psychiatric/Behavioral: Negative.     Per HPI unless specifically indicated above     Objective:    BP 138/80 (BP Location: Left Arm)   Pulse 90   Ht 5' 2.6" (1.59 m)   Wt 207 lb (93.9 kg)   SpO2 99%   BMI 37.14 kg/m   Wt Readings from Last 3 Encounters:  07/03/16 207 lb (93.9 kg)  11/03/15 201 lb 12.8 oz (91.5 kg)  10/18/15 202 lb 6.1 oz (91.8 kg)    Physical Exam  Constitutional: She is oriented to person, place, and time. She appears well-developed and well-nourished.  HENT:  Head: Normocephalic and atraumatic.  Right Ear: External ear normal.    Left Ear: External ear normal.  Nose: Nose normal.  Mouth/Throat: Oropharynx is clear and moist.  Eyes: Conjunctivae and EOM are normal. Pupils are equal, round, and reactive to light.  Neck: Normal range of motion. Neck supple. Carotid bruit is not present.  Cardiovascular: Normal rate and regular rhythm.   No murmur heard. 2/6 AS murmur  Pulmonary/Chest: Effort normal and breath sounds normal.  Breasts exam done at surgeon's office.  Abdominal: Soft. Bowel sounds are normal. There is no hepatosplenomegaly.  Musculoskeletal: Normal range of motion.  Neurological: She is alert and oriented to person, place, and time.  Skin: No rash noted.  Psychiatric: She has a normal mood and affect. Her behavior is normal. Judgment and thought content normal.    Results for orders placed or performed in visit on 05/02/15  Comprehensive metabolic panel  Result Value Ref Range   Glucose 103 (H) 65 - 99 mg/dL   BUN 9 8 - 27 mg/dL   Creatinine, Ser 0.66 0.57 - 1.00 mg/dL   GFR calc non Af Amer 89 >59 mL/min/1.73   GFR calc Af Amer 102 >59 mL/min/1.73   BUN/Creatinine Ratio 14 11 - 26   Sodium 140 136 - 144 mmol/L   Potassium 4.1 3.5 - 5.2 mmol/L   Chloride 99 97 - 106 mmol/L   CO2 24 18 - 29 mmol/L   Calcium 9.4 8.7 - 10.3 mg/dL  Total Protein 6.7 6.0 - 8.5 g/dL   Albumin 4.5 3.5 - 4.8 g/dL   Globulin, Total 2.2 1.5 - 4.5 g/dL   Albumin/Globulin Ratio 2.0 1.1 - 2.5   Bilirubin Total 0.4 0.0 - 1.2 mg/dL   Alkaline Phosphatase 73 39 - 117 IU/L   AST 26 0 - 40 IU/L   ALT 22 0 - 32 IU/L  Lipid panel  Result Value Ref Range   Cholesterol, Total 245 (H) 100 - 199 mg/dL   Triglycerides 146 0 - 149 mg/dL   HDL 86 >39 mg/dL   VLDL Cholesterol Cal 29 5 - 40 mg/dL   LDL Calculated 130 (H) 0 - 99 mg/dL   Chol/HDL Ratio 2.8 0.0 - 4.4 ratio units  CBC with Differential/Platelet  Result Value Ref Range   WBC 8.8 3.4 - 10.8 x10E3/uL   RBC 4.10 3.77 - 5.28 x10E6/uL   Hemoglobin 13.9 11.1 - 15.9  g/dL   Hematocrit 40.1 34.0 - 46.6 %   MCV 98 (H) 79 - 97 fL   MCH 33.9 (H) 26.6 - 33.0 pg   MCHC 34.7 31.5 - 35.7 g/dL   RDW 12.9 12.3 - 15.4 %   Platelets 243 150 - 379 x10E3/uL   Neutrophils 62 %   Lymphs 25 %   Monocytes 8 %   Eos 3 %   Basos 1 %   Neutrophils Absolute 5.5 1.4 - 7.0 x10E3/uL   Lymphocytes Absolute 2.2 0.7 - 3.1 x10E3/uL   Monocytes Absolute 0.7 0.1 - 0.9 x10E3/uL   EOS (ABSOLUTE) 0.2 0.0 - 0.4 x10E3/uL   Basophils Absolute 0.1 0.0 - 0.2 x10E3/uL   Immature Granulocytes 1 %   Immature Grans (Abs) 0.0 0.0 - 0.1 x10E3/uL  Urinalysis, Routine w reflex microscopic (not at [facility])  Result Value Ref Range   Specific Gravity, UA 1.005 1.005 - 1.030   pH, UA 7.0 5.0 - 7.5   Color, UA Yellow Yellow   Appearance Ur Clear Clear   Leukocytes, UA Negative Negative   Protein, UA Negative Negative/Trace   Glucose, UA Negative Negative   Ketones, UA Negative Negative   RBC, UA Negative Negative   Bilirubin, UA Negative Negative   Urobilinogen, Ur 0.2 0.2 - 1.0 mg/dL   Nitrite, UA Negative Negative  TSH  Result Value Ref Range   TSH 14.570 (H) 0.450 - 4.500 uIU/mL      Assessment & Plan:   Problem List Items Addressed This Visit      Cardiovascular and Mediastinum   Aortic stenosis, mild    Murmur apparently stable will follow-up with cardiology      Relevant Medications   amLODipine (NORVASC) 5 MG tablet   benazepril (LOTENSIN) 40 MG tablet   metoprolol succinate (TOPROL-XL) 25 MG 24 hr tablet   Other Relevant Orders   Ambulatory referral to Cardiology   Hypertension - Primary    The current medical regimen is effective;  continue present plan and medications.       Relevant Medications   amLODipine (NORVASC) 5 MG tablet   benazepril (LOTENSIN) 40 MG tablet   metoprolol succinate (TOPROL-XL) 25 MG 24 hr tablet   Other Relevant Orders   CBC with Differential   Comprehensive metabolic panel   Urinalysis, Routine w reflex microscopic     Endocrine    Hyperthyroidism    Apparently controlled off thyroid medication will check TSH today.      Relevant Medications   metoprolol succinate (TOPROL-XL) 25 MG 24 hr  tablet   Other Relevant Orders   TSH     Musculoskeletal and Integument   Osteoporosis    Check bone density      Relevant Orders   DG Bone Density     Other   Hypercholesteremia    Diet controlled      Relevant Medications   amLODipine (NORVASC) 5 MG tablet   benazepril (LOTENSIN) 40 MG tablet   metoprolol succinate (TOPROL-XL) 25 MG 24 hr tablet   Other Relevant Orders   Lipid panel    Other Visit Diagnoses    Annual physical exam       Relevant Orders   TSH   CBC with Differential   Comprehensive metabolic panel   Lipid panel   Urinalysis, Routine w reflex microscopic   Screening cholesterol level       Relevant Orders   Lipid panel   Need for pneumococcal vaccination       Relevant Orders   Pneumococcal polysaccharide vaccine 23-valent greater than or equal to 2yo subcutaneous/IM     Will discontinue Fosamax as patient has been on probably long enough we'll check bone density later this summer at time of mammogram.  Follow up plan: Return in about 6 months (around 12/31/2016) for BMP.

## 2016-07-03 NOTE — Assessment & Plan Note (Signed)
Murmur apparently stable will follow-up with cardiology

## 2016-07-03 NOTE — Assessment & Plan Note (Signed)
The current medical regimen is effective;  continue present plan and medications.  

## 2016-07-03 NOTE — Assessment & Plan Note (Signed)
Diet controlled.  

## 2016-07-03 NOTE — Assessment & Plan Note (Signed)
Check bone density. 

## 2016-07-03 NOTE — Assessment & Plan Note (Signed)
Apparently controlled off thyroid medication will check TSH today.

## 2016-07-04 LAB — LIPID PANEL
Chol/HDL Ratio: 2.6 ratio units (ref 0.0–4.4)
Cholesterol, Total: 237 mg/dL — ABNORMAL HIGH (ref 100–199)
HDL: 92 mg/dL (ref >39)
LDL Calculated: 126 mg/dL — ABNORMAL HIGH (ref 0–99)
Triglycerides: 97 mg/dL (ref 0–149)
VLDL Cholesterol Cal: 19 mg/dL (ref 5–40)

## 2016-07-04 LAB — CBC WITH DIFFERENTIAL/PLATELET
Basophils Absolute: 0 10*3/uL (ref 0.0–0.2)
Basos: 1 %
EOS (ABSOLUTE): 0.2 10*3/uL (ref 0.0–0.4)
Eos: 3 %
Hematocrit: 41.1 % (ref 34.0–46.6)
Hemoglobin: 14.1 g/dL (ref 11.1–15.9)
Immature Grans (Abs): 0 10*3/uL (ref 0.0–0.1)
Immature Granulocytes: 0 %
Lymphocytes Absolute: 2.5 10*3/uL (ref 0.7–3.1)
Lymphs: 34 %
MCH: 32.7 pg (ref 26.6–33.0)
MCHC: 34.3 g/dL (ref 31.5–35.7)
MCV: 95 fL (ref 79–97)
Monocytes Absolute: 0.7 10*3/uL (ref 0.1–0.9)
Monocytes: 9 %
Neutrophils Absolute: 3.9 10*3/uL (ref 1.4–7.0)
Neutrophils: 53 %
Platelets: 209 10*3/uL (ref 150–379)
RBC: 4.31 x10E6/uL (ref 3.77–5.28)
RDW: 13 % (ref 12.3–15.4)
WBC: 7.3 10*3/uL (ref 3.4–10.8)

## 2016-07-04 LAB — TSH: TSH: 8.03 u[IU]/mL — ABNORMAL HIGH (ref 0.450–4.500)

## 2016-07-09 ENCOUNTER — Encounter: Payer: Medicare Other | Admitting: Family Medicine

## 2016-07-30 DIAGNOSIS — I10 Essential (primary) hypertension: Secondary | ICD-10-CM | POA: Diagnosis not present

## 2016-07-30 DIAGNOSIS — E0590 Thyrotoxicosis, unspecified without thyrotoxic crisis or storm: Secondary | ICD-10-CM | POA: Diagnosis not present

## 2016-07-30 DIAGNOSIS — I350 Nonrheumatic aortic (valve) stenosis: Secondary | ICD-10-CM | POA: Diagnosis not present

## 2016-07-30 DIAGNOSIS — E039 Hypothyroidism, unspecified: Secondary | ICD-10-CM | POA: Insufficient documentation

## 2016-08-07 ENCOUNTER — Telehealth: Payer: Self-pay | Admitting: Family Medicine

## 2016-08-07 DIAGNOSIS — E039 Hypothyroidism, unspecified: Secondary | ICD-10-CM

## 2016-08-07 NOTE — Telephone Encounter (Signed)
I see a referral for cardiology, but I do not see one for thyroid specialist. Please advise.

## 2016-08-07 NOTE — Telephone Encounter (Signed)
Call pt 

## 2016-08-07 NOTE — Telephone Encounter (Signed)
Patient called regarding a referral Dr Jeananne Rama was working on for her to a thyroid specialist -Dr. Purnell Shoemaker (unsure of sp).  She said a message could be left on her VM if no answer  Thanks  [phone]

## 2016-08-08 NOTE — Telephone Encounter (Signed)
Phone call Discussed with patient TSH rising had previously had this discussion with patient who had requested a referral back to her thyroid doctor at [facility] Dr. Mickie Kay. This somehow fell through the cracks will make this appointment now.

## 2016-08-17 DIAGNOSIS — I350 Nonrheumatic aortic (valve) stenosis: Secondary | ICD-10-CM | POA: Diagnosis not present

## 2016-09-05 DIAGNOSIS — E039 Hypothyroidism, unspecified: Secondary | ICD-10-CM | POA: Diagnosis not present

## 2016-10-31 DIAGNOSIS — E039 Hypothyroidism, unspecified: Secondary | ICD-10-CM | POA: Diagnosis not present

## 2016-11-07 DIAGNOSIS — E039 Hypothyroidism, unspecified: Secondary | ICD-10-CM | POA: Diagnosis not present

## 2016-11-13 DIAGNOSIS — H26491 Other secondary cataract, right eye: Secondary | ICD-10-CM | POA: Diagnosis not present

## 2016-12-10 ENCOUNTER — Other Ambulatory Visit: Payer: Self-pay | Admitting: Surgery

## 2016-12-10 DIAGNOSIS — L821 Other seborrheic keratosis: Secondary | ICD-10-CM | POA: Diagnosis not present

## 2016-12-10 DIAGNOSIS — Z86018 Personal history of other benign neoplasm: Secondary | ICD-10-CM | POA: Diagnosis not present

## 2016-12-10 DIAGNOSIS — L570 Actinic keratosis: Secondary | ICD-10-CM | POA: Diagnosis not present

## 2016-12-10 DIAGNOSIS — Z1231 Encounter for screening mammogram for malignant neoplasm of breast: Secondary | ICD-10-CM

## 2016-12-10 DIAGNOSIS — L918 Other hypertrophic disorders of the skin: Secondary | ICD-10-CM | POA: Diagnosis not present

## 2016-12-10 DIAGNOSIS — Z85828 Personal history of other malignant neoplasm of skin: Secondary | ICD-10-CM | POA: Diagnosis not present

## 2016-12-17 DIAGNOSIS — H26491 Other secondary cataract, right eye: Secondary | ICD-10-CM | POA: Diagnosis not present

## 2017-01-02 ENCOUNTER — Ambulatory Visit
Admission: RE | Admit: 2017-01-02 | Discharge: 2017-01-02 | Disposition: A | Discharge status: Home / Self Care | Payer: Medicare Other | Source: Ambulatory Visit | Attending: Surgery | Admitting: Surgery

## 2017-01-02 DIAGNOSIS — Z1231 Encounter for screening mammogram for malignant neoplasm of breast: Secondary | ICD-10-CM | POA: Insufficient documentation

## 2017-01-02 IMAGING — MG MM DIGITAL SCREENING BILAT W/ TOMO W/ CAD
8 of 12 series · 8 of 28 positions shown · non-contrast
Comparison: Previous exam(s).

CLINICAL DATA: Screening.

EXAM:
2D DIGITAL SCREENING BILATERAL MAMMOGRAM WITH CAD AND ADJUNCT TOMO

[R CC]
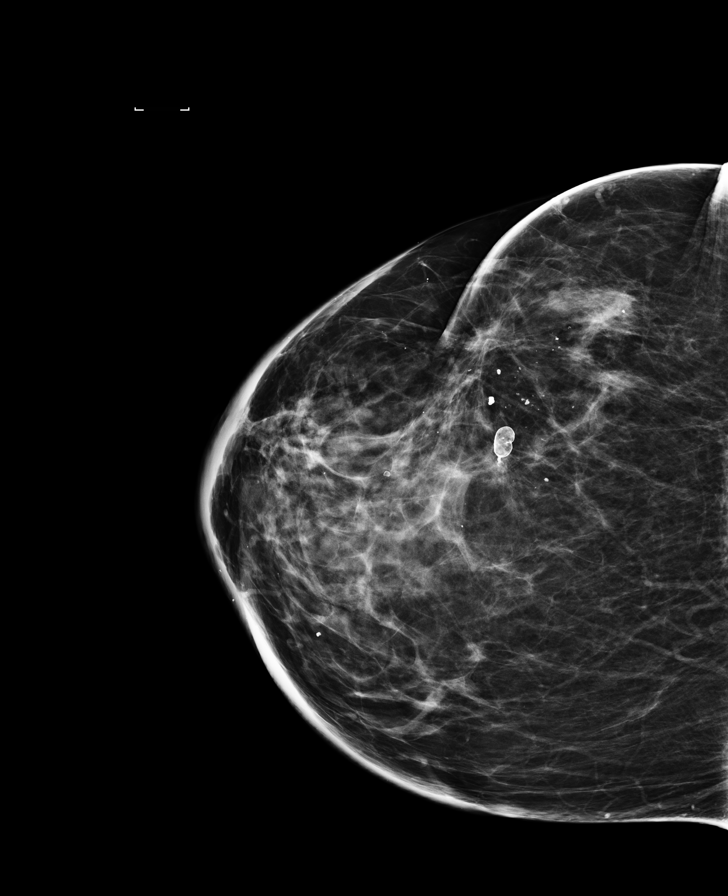

[L MLO]
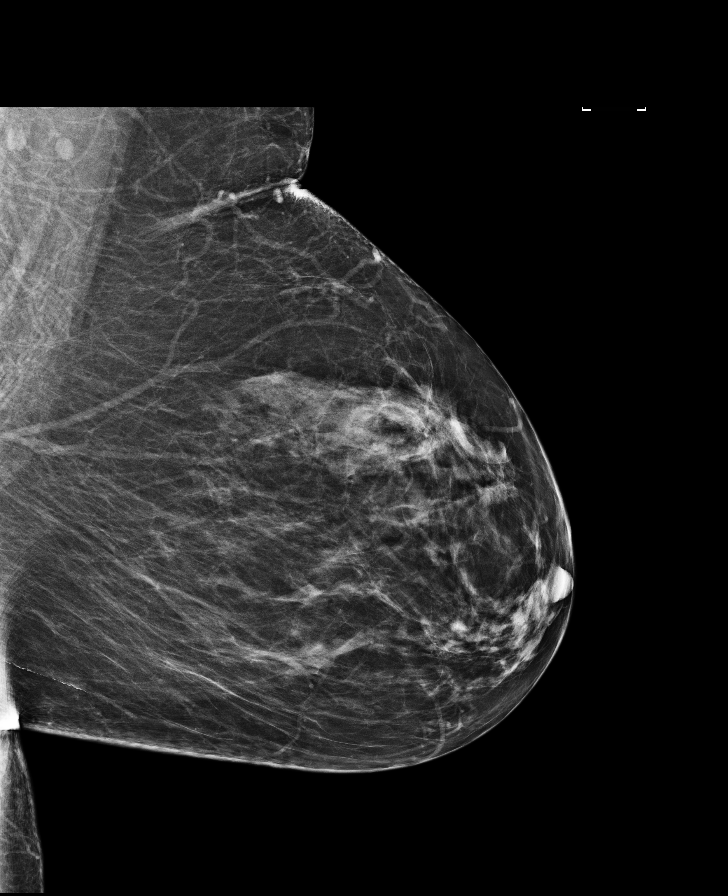

[L CC]
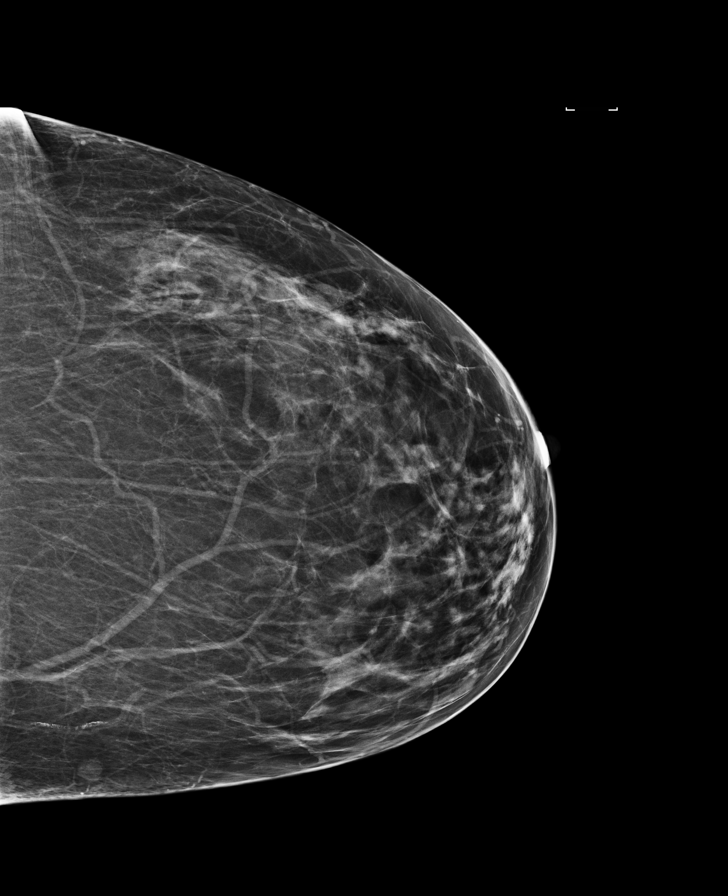

[R CC synth-2D]
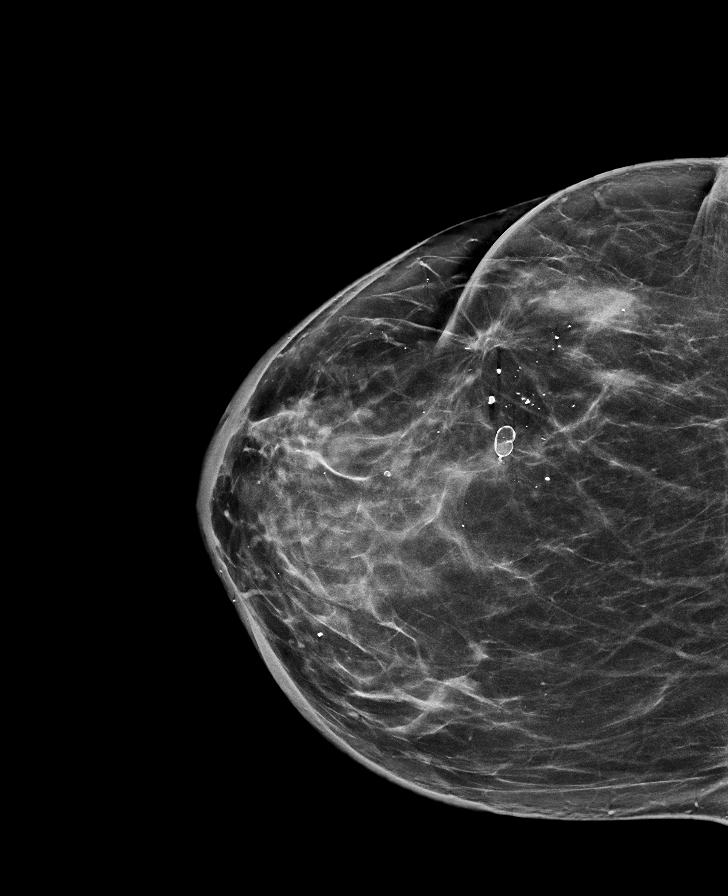

[L CC synth-2D]
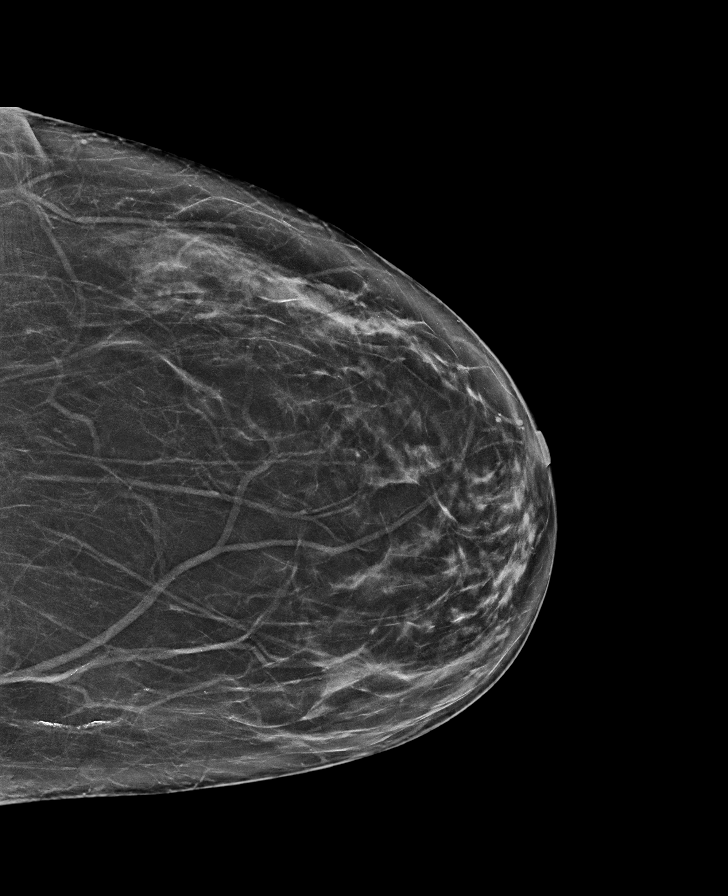

[R MLO]
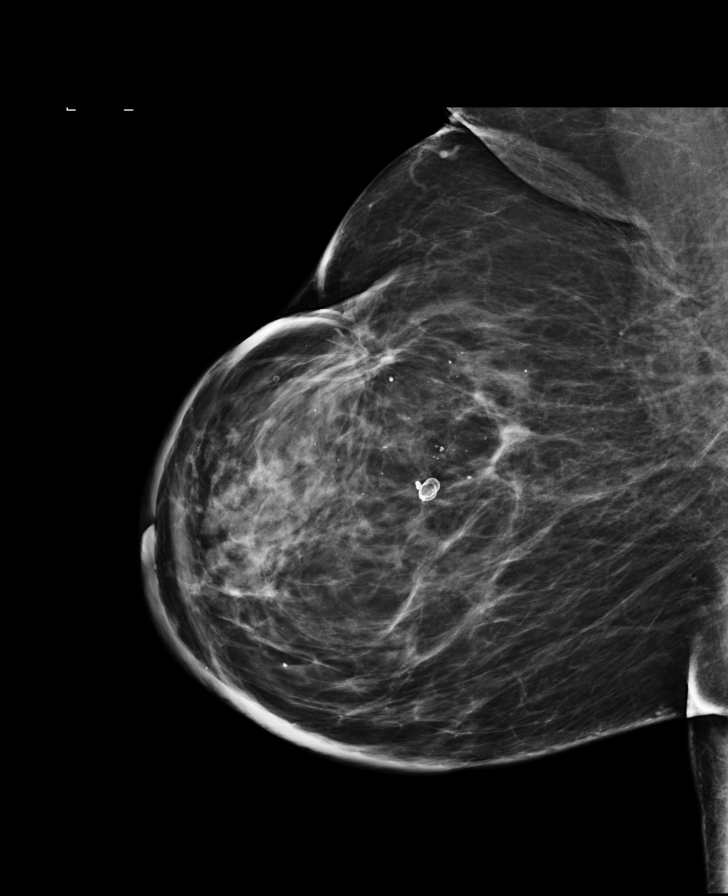

[R MLO synth-2D]
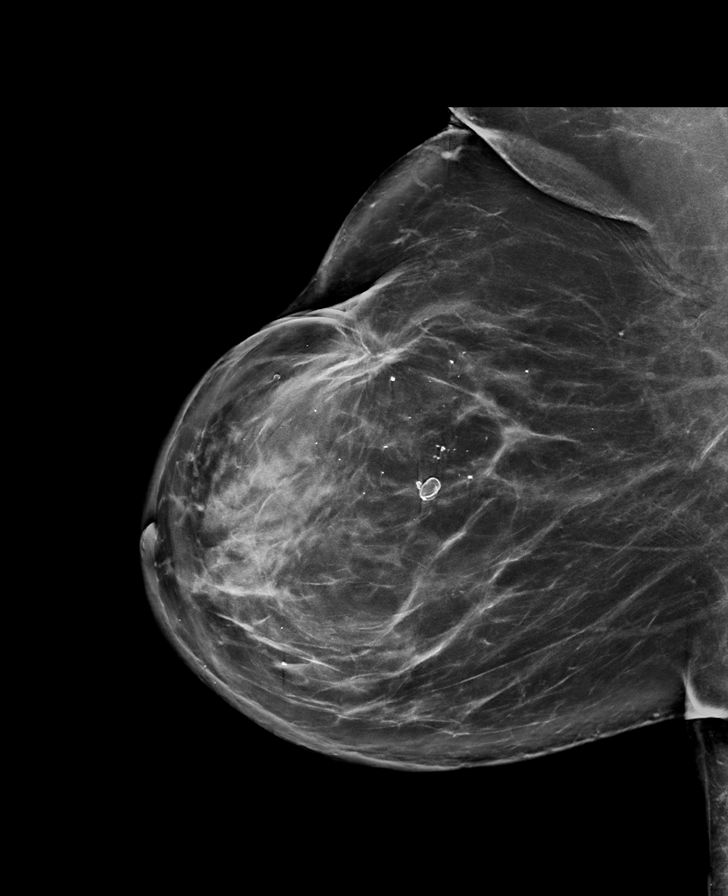

[L MLO synth-2D]
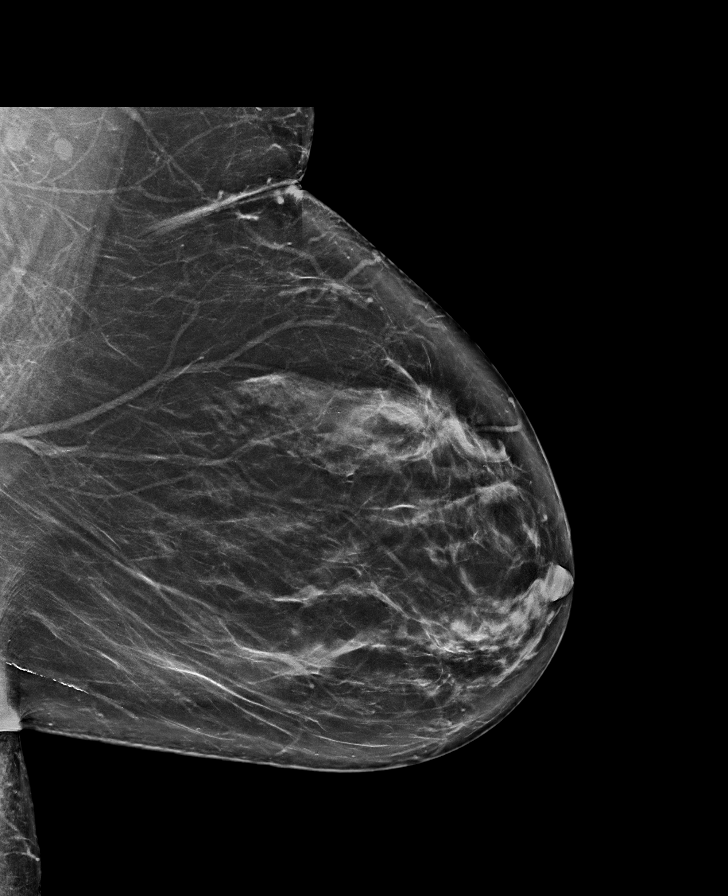

[8 of 28 positions shown; findings below may reference images not displayed]

ACR Breast Density Category c: The breast tissue is heterogeneously
dense, which may obscure small masses.
FINDINGS: There are no findings suspicious for malignancy. Stable post
lumpectomy changes on the right. There is stable architectural
distortion on the left from a benign excisional biopsy. Images were
processed with CAD.
IMPRESSION: No mammographic evidence of malignancy. A result letter of this
screening mammogram will be mailed directly to the patient.

RECOMMENDATION:
Screening mammogram in one year. (Code:[GH])

BI-RADS CATEGORY  2: Benign.

## 2017-01-14 DIAGNOSIS — Z853 Personal history of malignant neoplasm of breast: Secondary | ICD-10-CM | POA: Diagnosis not present

## 2017-02-25 DIAGNOSIS — S61210A Laceration without foreign body of right index finger without damage to nail, initial encounter: Secondary | ICD-10-CM | POA: Diagnosis not present

## 2017-03-03 DIAGNOSIS — Z4802 Encounter for removal of sutures: Secondary | ICD-10-CM | POA: Diagnosis not present

## 2017-03-03 DIAGNOSIS — S61210D Laceration without foreign body of right index finger without damage to nail, subsequent encounter: Secondary | ICD-10-CM | POA: Diagnosis not present

## 2017-03-25 ENCOUNTER — Ambulatory Visit (INDEPENDENT_AMBULATORY_CARE_PROVIDER_SITE_OTHER): Payer: Medicare Other

## 2017-03-25 DIAGNOSIS — Z23 Encounter for immunization: Secondary | ICD-10-CM

## 2017-03-25 NOTE — Patient Instructions (Signed)

## 2017-05-03 DIAGNOSIS — E039 Hypothyroidism, unspecified: Secondary | ICD-10-CM | POA: Diagnosis not present

## 2017-05-13 DIAGNOSIS — E039 Hypothyroidism, unspecified: Secondary | ICD-10-CM | POA: Diagnosis not present

## 2017-07-18 ENCOUNTER — Other Ambulatory Visit: Payer: Self-pay | Admitting: Family Medicine

## 2017-07-18 DIAGNOSIS — I10 Essential (primary) hypertension: Secondary | ICD-10-CM

## 2017-07-19 ENCOUNTER — Other Ambulatory Visit: Payer: Self-pay | Admitting: Family Medicine

## 2017-07-19 DIAGNOSIS — I10 Essential (primary) hypertension: Secondary | ICD-10-CM

## 2017-07-19 NOTE — Telephone Encounter (Signed)
Lotensin refill request Last OV 07/03/16.  Don't see any future appts for follow up [facility], [place]  [place].

## 2017-07-22 ENCOUNTER — Encounter: Payer: Self-pay | Admitting: Family Medicine

## 2017-07-22 ENCOUNTER — Ambulatory Visit (INDEPENDENT_AMBULATORY_CARE_PROVIDER_SITE_OTHER): Payer: Medicare Other | Admitting: Family Medicine

## 2017-07-22 VITALS — BP 135/84 | HR 88 | Ht 62.99 in | Wt 203.0 lb

## 2017-07-22 DIAGNOSIS — E7800 Pure hypercholesterolemia, unspecified: Secondary | ICD-10-CM | POA: Diagnosis not present

## 2017-07-22 DIAGNOSIS — I10 Essential (primary) hypertension: Secondary | ICD-10-CM | POA: Diagnosis not present

## 2017-07-22 DIAGNOSIS — I350 Nonrheumatic aortic (valve) stenosis: Secondary | ICD-10-CM | POA: Diagnosis not present

## 2017-07-22 DIAGNOSIS — M810 Age-related osteoporosis without current pathological fracture: Secondary | ICD-10-CM | POA: Diagnosis not present

## 2017-07-22 DIAGNOSIS — C50911 Malignant neoplasm of unspecified site of right female breast: Secondary | ICD-10-CM

## 2017-07-22 DIAGNOSIS — E0590 Thyrotoxicosis, unspecified without thyrotoxic crisis or storm: Secondary | ICD-10-CM | POA: Diagnosis not present

## 2017-07-22 DIAGNOSIS — Z7189 Other specified counseling: Secondary | ICD-10-CM | POA: Diagnosis not present

## 2017-07-22 LAB — URINALYSIS, ROUTINE W REFLEX MICROSCOPIC
Bilirubin, UA: NEGATIVE
Glucose, UA: NEGATIVE
Ketones, UA: NEGATIVE
Leukocytes, UA: NEGATIVE
Nitrite, UA: NEGATIVE
Protein, UA: NEGATIVE
RBC, UA: NEGATIVE
Specific Gravity, UA: <1.005 — ABNORMAL LOW (ref 1.005–1.030)
Urobilinogen, Ur: 0.2 mg/dL (ref 0.2–1.0)
pH, UA: 6.5 (ref 5.0–7.5)

## 2017-07-22 MED ORDER — BENAZEPRIL HCL 40 MG PO TABS: 40.0000 mg | ORAL_TABLET | Freq: Every day | ORAL | 4 refills | Status: DC

## 2017-07-22 MED ORDER — METOPROLOL SUCCINATE ER 25 MG PO TB24: 25.0000 mg | ORAL_TABLET | Freq: Every day | ORAL | 4 refills | Status: DC

## 2017-07-22 MED ORDER — AMLODIPINE BESYLATE 5 MG PO TABS: 5.0000 mg | ORAL_TABLET | Freq: Every day | ORAL | 4 refills | Status: DC

## 2017-07-22 MED ORDER — SILVER SULFADIAZINE 1 % EX CREA: 1.0000 "application " | TOPICAL_CREAM | Freq: Every day | CUTANEOUS | 0 refills | Status: DC

## 2017-07-22 NOTE — Assessment & Plan Note (Signed)
The current medical regimen is effective;  continue present plan and medications.  

## 2017-07-22 NOTE — Assessment & Plan Note (Signed)
Reviewed aortic stenosis cardiac echo from a year ago reported is mild and murmur from cardiology note reported is 2/6 which is the same today.

## 2017-07-22 NOTE — Assessment & Plan Note (Signed)
Good reports for 8 years now reviewed Dr. Thompson Caul note from this summer will be to follow-up breast exam and mammogram this summer. We will piggyback with regular hypertension review

## 2017-07-22 NOTE — Progress Notes (Signed)
BP 135/84   Pulse 88   Ht 5' 2.99" (1.6 m)   Wt 203 lb (92.1 kg)   SpO2 99%   BMI 35.97 kg/m    Subjective:    Patient ID: Maria Davies, female    DOB: Oct 29, 1940, 77 y.o.   MRN: [id number]  HPI: Maria Davies is a 77 y.o. female  Annual check Patient all in all doing well with good control of blood pressure with blood pressure medications taken faithfully no side effects Hyperthyroid now hypothyroid followed by endocrinology at [facility] clinic with good control. Patient's been having annual mammogram and breast check done through [facility] in the summer by Dr. Tamala Julian who is retiring this year will need follow-up this summer.  Relevant past medical, surgical, family and social history reviewed and updated as indicated. Interim medical history since our last visit reviewed. Allergies and medications reviewed and updated.  Review of Systems  Constitutional: Negative.   HENT: Negative.   Eyes: Negative.   Respiratory: Negative.   Cardiovascular: Negative.   Gastrointestinal: Negative.   Endocrine: Negative.   Genitourinary: Negative.   Musculoskeletal: Negative.   Skin: Negative.   Allergic/Immunologic: Negative.   Neurological: Negative.   Hematological: Negative.   Psychiatric/Behavioral: Negative.     Per HPI unless specifically indicated above     Objective:    BP 135/84   Pulse 88   Ht 5' 2.99" (1.6 m)   Wt 203 lb (92.1 kg)   SpO2 99%   BMI 35.97 kg/m   Wt Readings from Last 3 Encounters:  07/22/17 203 lb (92.1 kg)  07/03/16 207 lb (93.9 kg)  11/03/15 201 lb 12.8 oz (91.5 kg)    Physical Exam  Constitutional: She is oriented to person, place, and time. She appears well-developed and well-nourished.  HENT:  Head: Normocephalic and atraumatic.  Right Ear: External ear normal.  Left Ear: External ear normal.  Nose: Nose normal.  Mouth/Throat: Oropharynx is clear and moist.  Eyes: Conjunctivae and EOM are normal. Pupils are equal, round, and  reactive to light.  Neck: Normal range of motion. Neck supple. Carotid bruit is not present.  Cardiovascular: Normal rate and regular rhythm.  No murmur heard. 2/6 AS murmur  Pulmonary/Chest: Effort normal and breath sounds normal. She exhibits no mass. Right breast exhibits no mass, no skin change and no tenderness. Left breast exhibits no mass, no skin change and no tenderness. Breasts are symmetrical.  Abdominal: Soft. Bowel sounds are normal. There is no hepatosplenomegaly.  Musculoskeletal: Normal range of motion.  Neurological: She is alert and oriented to person, place, and time.  Skin: No rash noted.  Psychiatric: She has a normal mood and affect. Her behavior is normal. Judgment and thought content normal.    Results for orders placed or performed in visit on 07/03/16  Microscopic Examination  Result Value Ref Range   WBC, UA None seen 0 - 5 /hpf   RBC, UA None seen 0 - 2 /hpf   Epithelial Cells (non renal) 0-10 0 - 10 /hpf   Bacteria, UA None seen None seen/Few  TSH  Result Value Ref Range   TSH 8.030 (H) 0.450 - 4.500 uIU/mL  CBC with Differential  Result Value Ref Range   WBC 7.3 3.4 - 10.8 x10E3/uL   RBC 4.31 3.77 - 5.28 x10E6/uL   Hemoglobin 14.1 11.1 - 15.9 g/dL   Hematocrit 41.1 34.0 - 46.6 %   MCV 95 79 - 97 fL   MCH 32.7 26.6 -  33.0 pg   MCHC 34.3 31.5 - 35.7 g/dL   RDW 13.0 12.3 - 15.4 %   Platelets 209 150 - 379 x10E3/uL   Neutrophils 53 Not Estab. %   Lymphs 34 Not Estab. %   Monocytes 9 Not Estab. %   Eos 3 Not Estab. %   Basos 1 Not Estab. %   Neutrophils Absolute 3.9 1.4 - 7.0 x10E3/uL   Lymphocytes Absolute 2.5 0.7 - 3.1 x10E3/uL   Monocytes Absolute 0.7 0.1 - 0.9 x10E3/uL   EOS (ABSOLUTE) 0.2 0.0 - 0.4 x10E3/uL   Basophils Absolute 0.0 0.0 - 0.2 x10E3/uL   Immature Granulocytes 0 Not Estab. %   Immature Grans (Abs) 0.0 0.0 - 0.1 x10E3/uL  Lipid panel  Result Value Ref Range   Cholesterol, Total 237 (H) 100 - 199 mg/dL   Triglycerides 97 0 -  149 mg/dL   HDL 92 >39 mg/dL   VLDL Cholesterol Cal 19 5 - 40 mg/dL   LDL Calculated 126 (H) 0 - 99 mg/dL   Chol/HDL Ratio 2.6 0.0 - 4.4 ratio units  Urinalysis, Routine w reflex microscopic  Result Value Ref Range   Specific Gravity, UA 1.010 1.005 - 1.030   pH, UA 7.0 5.0 - 7.5   Color, UA Yellow Yellow   Appearance Ur Clear Clear   Leukocytes, UA Negative Negative   Protein, UA Negative Negative/Trace   Glucose, UA Negative Negative   Ketones, UA 1+ (A) Negative   RBC, UA Negative Negative   Bilirubin, UA Negative Negative   Urobilinogen, Ur 0.2 0.2 - 1.0 mg/dL   Nitrite, UA Negative Negative   Microscopic Examination See below:       Assessment & Plan:   Problem List Items Addressed This Visit      Cardiovascular and Mediastinum   Aortic stenosis, mild - Primary    Reviewed aortic stenosis cardiac echo from a year ago reported is mild and murmur from cardiology note reported is 2/6 which is the same today.      Relevant Medications   amLODipine (NORVASC) 5 MG tablet   benazepril (LOTENSIN) 40 MG tablet   metoprolol succinate (TOPROL-XL) 25 MG 24 hr tablet   Hypertension    The current medical regimen is effective;  continue present plan and medications.       Relevant Medications   amLODipine (NORVASC) 5 MG tablet   benazepril (LOTENSIN) 40 MG tablet   metoprolol succinate (TOPROL-XL) 25 MG 24 hr tablet   Other Relevant Orders   CBC with Differential/Platelet   Comprehensive metabolic panel   Lipid panel   Urinalysis, Routine w reflex microscopic     Endocrine   Hyperthyroidism    Followed by endocrinology and stable      Relevant Medications   metoprolol succinate (TOPROL-XL) 25 MG 24 hr tablet   Other Relevant Orders   TSH   DG Bone Density     Musculoskeletal and Integument   Osteoporosis    Discussed osteoporosis patient's been on Fosamax for years doing well taking calcium      Relevant Orders   DG Bone Density     Other   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 Thompson Caul note from this summer will be to follow-up breast exam and mammogram this summer. We will piggyback with regular hypertension review      Relevant Orders   DG Bone Density   Hypercholesteremia    The  current medical regimen is effective;  continue present plan and medications.       Relevant Medications   amLODipine (NORVASC) 5 MG tablet   benazepril (LOTENSIN) 40 MG tablet   metoprolol succinate (TOPROL-XL) 25 MG 24 hr tablet   Other Relevant Orders   CBC with Differential/Platelet   Comprehensive metabolic panel   Lipid panel   Urinalysis, Routine w reflex microscopic   Advanced care planning/counseling discussion    A voluntary discussion about advance care planning including the explanation and discussion of advance directives was extensively discussed  with the patient.  Explanation about the health care proxy and Living will was reviewed and packet with forms with explanation of how to fill them out was given. .  Time spent: 16+ min encounter       Individuals present: Pt.           Follow up plan: Return in about 6 months (around 01/19/2018) for BMP,  Lipids, ALT, AST.  Mammogram and breast exam

## 2017-07-22 NOTE — Assessment & Plan Note (Signed)
A voluntary discussion about advance care planning including the explanation and discussion of advance directives was extensively discussed  with the patient.  Explanation about the health care proxy and Living will was reviewed and packet with forms with explanation of how to fill them out was given. .  Time spent: 16+ min encounter       Individuals present: Pt.

## 2017-07-22 NOTE — Assessment & Plan Note (Signed)
Followed by endocrinology and stable 

## 2017-07-22 NOTE — Assessment & Plan Note (Signed)
Discussed osteoporosis patient's been on Fosamax for years doing well taking calcium

## 2017-07-23 ENCOUNTER — Encounter: Payer: Self-pay | Admitting: Family Medicine

## 2017-07-23 LAB — CBC WITH DIFFERENTIAL/PLATELET
Basophils Absolute: 0 10*3/uL (ref 0.0–0.2)
Basos: 1 %
EOS (ABSOLUTE): 0.2 10*3/uL (ref 0.0–0.4)
Eos: 4 %
Hematocrit: 39.5 % (ref 34.0–46.6)
Hemoglobin: 13.6 g/dL (ref 11.1–15.9)
Immature Grans (Abs): 0 10*3/uL (ref 0.0–0.1)
Immature Granulocytes: 1 %
Lymphocytes Absolute: 2 10*3/uL (ref 0.7–3.1)
Lymphs: 33 %
MCH: 33.1 pg — ABNORMAL HIGH (ref 26.6–33.0)
MCHC: 34.4 g/dL (ref 31.5–35.7)
MCV: 96 fL (ref 79–97)
Monocytes Absolute: 0.6 10*3/uL (ref 0.1–0.9)
Monocytes: 10 %
Neutrophils Absolute: 3.1 10*3/uL (ref 1.4–7.0)
Neutrophils: 51 %
Platelets: 192 10*3/uL (ref 150–379)
RBC: 4.11 x10E6/uL (ref 3.77–5.28)
RDW: 13 % (ref 12.3–15.4)
WBC: 5.9 10*3/uL (ref 3.4–10.8)

## 2017-07-23 LAB — COMPREHENSIVE METABOLIC PANEL
ALT: 15 IU/L (ref 0–32)
AST: 17 IU/L (ref 0–40)
Albumin/Globulin Ratio: 1.8 (ref 1.2–2.2)
Albumin: 4.2 g/dL (ref 3.5–4.8)
Alkaline Phosphatase: 70 IU/L (ref 39–117)
BUN/Creatinine Ratio: 22 (ref 12–28)
BUN: 13 mg/dL (ref 8–27)
Bilirubin Total: 0.5 mg/dL (ref 0.0–1.2)
CO2: 24 mmol/L (ref 20–29)
Calcium: 9.1 mg/dL (ref 8.7–10.3)
Chloride: 103 mmol/L (ref 96–106)
Creatinine, Ser: 0.59 mg/dL (ref 0.57–1.00)
GFR calc Af Amer: 104 mL/min/{1.73_m2} (ref >59)
GFR calc non Af Amer: 91 mL/min/{1.73_m2} (ref >59)
Globulin, Total: 2.3 g/dL (ref 1.5–4.5)
Glucose: 104 mg/dL — ABNORMAL HIGH (ref 65–99)
Potassium: 4 mmol/L (ref 3.5–5.2)
Sodium: 143 mmol/L (ref 134–144)
Total Protein: 6.5 g/dL (ref 6.0–8.5)

## 2017-07-23 LAB — TSH: TSH: 3.25 u[IU]/mL (ref 0.450–4.500)

## 2017-07-23 LAB — LIPID PANEL
Chol/HDL Ratio: 2.7 ratio (ref 0.0–4.4)
Cholesterol, Total: 218 mg/dL — ABNORMAL HIGH (ref 100–199)
HDL: 82 mg/dL (ref >39)
LDL Calculated: 121 mg/dL — ABNORMAL HIGH (ref 0–99)
Triglycerides: 76 mg/dL (ref 0–149)
VLDL Cholesterol Cal: 15 mg/dL (ref 5–40)

## 2017-08-05 DIAGNOSIS — H0231 Blepharochalasis right upper eyelid: Secondary | ICD-10-CM | POA: Diagnosis not present

## 2017-08-05 DIAGNOSIS — H11123 Conjunctival concretions, bilateral: Secondary | ICD-10-CM | POA: Diagnosis not present

## 2017-08-05 DIAGNOSIS — H0234 Blepharochalasis left upper eyelid: Secondary | ICD-10-CM | POA: Diagnosis not present

## 2017-11-06 ENCOUNTER — Encounter: Payer: Self-pay | Admitting: Family Medicine

## 2017-11-06 DIAGNOSIS — E039 Hypothyroidism, unspecified: Secondary | ICD-10-CM | POA: Diagnosis not present

## 2017-11-13 DIAGNOSIS — E039 Hypothyroidism, unspecified: Secondary | ICD-10-CM | POA: Diagnosis not present

## 2017-12-05 ENCOUNTER — Telehealth: Payer: Self-pay

## 2017-12-05 NOTE — Telephone Encounter (Signed)
Copied from [place] [id number]. Topic: Medicare AWV >> Dec 05, 2017  1:50 PM Aydn Ferrara A, LPN wrote: Reason for CRM: Called to schedule medicare annual wellness visit with NHA- [place] Lapaglia,LPN at [facility]. Please schedule anytime. Can have on same day as Dr.Johnson if preferred. Any questions please contact Joanette Gula on skype or by phone- [phone]

## 2017-12-23 DIAGNOSIS — Z1283 Encounter for screening for malignant neoplasm of skin: Secondary | ICD-10-CM | POA: Diagnosis not present

## 2017-12-23 DIAGNOSIS — Z85828 Personal history of other malignant neoplasm of skin: Secondary | ICD-10-CM | POA: Diagnosis not present

## 2017-12-23 DIAGNOSIS — L570 Actinic keratosis: Secondary | ICD-10-CM | POA: Diagnosis not present

## 2017-12-23 DIAGNOSIS — L578 Other skin changes due to chronic exposure to nonionizing radiation: Secondary | ICD-10-CM | POA: Diagnosis not present

## 2017-12-23 DIAGNOSIS — Z872 Personal history of diseases of the skin and subcutaneous tissue: Secondary | ICD-10-CM | POA: Diagnosis not present

## 2018-01-13 ENCOUNTER — Other Ambulatory Visit: Payer: Self-pay | Admitting: Surgery

## 2018-01-13 DIAGNOSIS — Z1231 Encounter for screening mammogram for malignant neoplasm of breast: Secondary | ICD-10-CM

## 2018-01-16 ENCOUNTER — Ambulatory Visit: Payer: Medicare Other

## 2018-01-20 ENCOUNTER — Other Ambulatory Visit: Payer: Self-pay

## 2018-01-20 ENCOUNTER — Ambulatory Visit: Payer: Medicare Other | Admitting: Family Medicine

## 2018-01-23 ENCOUNTER — Ambulatory Visit (INDEPENDENT_AMBULATORY_CARE_PROVIDER_SITE_OTHER): Payer: Medicare Other | Admitting: Family Medicine

## 2018-01-23 ENCOUNTER — Encounter: Payer: Self-pay | Admitting: Family Medicine

## 2018-01-23 VITALS — BP 138/77 | HR 81 | Temp 98.6°F | Wt 206.4 lb

## 2018-01-23 DIAGNOSIS — I10 Essential (primary) hypertension: Secondary | ICD-10-CM | POA: Diagnosis not present

## 2018-01-23 DIAGNOSIS — E7800 Pure hypercholesterolemia, unspecified: Secondary | ICD-10-CM | POA: Diagnosis not present

## 2018-01-23 MED ORDER — METOPROLOL SUCCINATE ER 25 MG PO TB24: 25.0000 mg | ORAL_TABLET | Freq: Every day | ORAL | 4 refills | Status: DC

## 2018-01-23 MED ORDER — BENAZEPRIL HCL 40 MG PO TABS: 40.0000 mg | ORAL_TABLET | Freq: Every day | ORAL | 4 refills | Status: DC

## 2018-01-23 MED ORDER — AMLODIPINE BESYLATE 5 MG PO TABS: 5.0000 mg | ORAL_TABLET | Freq: Every day | ORAL | 4 refills | Status: DC

## 2018-01-23 NOTE — Assessment & Plan Note (Signed)
Under good control. Continue current regimen. Call with any concerns. Continue to monitor. Refills given.

## 2018-01-23 NOTE — Progress Notes (Signed)
BP 138/77 (BP Location: Left Arm, Patient Position: Sitting, Cuff Size: Large)   Pulse 81   Temp 98.6 F (37 C)   Wt 206 lb 6 oz (93.6 kg)   SpO2 98%   BMI 36.57 kg/m    Subjective:    Patient ID: Maria Davies, female    DOB: 01-Aug-1940, 77 y.o.   MRN: [id number]  HPI: Maria Davies is a 77 y.o. female  Chief Complaint  Patient presents with  . Hypertension  . Hyperlipidemia   HYPERTENSION / HYPERLIPIDEMIA Satisfied with current treatment? yes Duration of hypertension: chronic BP monitoring frequency: not checking BP medication side effects: no Past BP meds: amlodipine, benazepril, metoprolol Duration of hyperlipidemia: chronic Cholesterol medication side effects: no Cholesterol supplements: fish oil Past cholesterol medications: fish oil Medication compliance: excellent compliance Aspirin: yes Recent stressors: no Recurrent headaches: no Visual changes: no Palpitations: no Dyspnea: no Chest pain: no Lower extremity edema: no Dizzy/lightheaded: no   Relevant past medical, surgical, family and social history reviewed and updated as indicated. Interim medical history since our last visit reviewed. Allergies and medications reviewed and updated.  Review of Systems  Constitutional: Negative.   Respiratory: Negative.   Cardiovascular: Negative.   Skin: Negative.   Psychiatric/Behavioral: Negative.     Per HPI unless specifically indicated above     Objective:    BP 138/77 (BP Location: Left Arm, Patient Position: Sitting, Cuff Size: Large)   Pulse 81   Temp 98.6 F (37 C)   Wt 206 lb 6 oz (93.6 kg)   SpO2 98%   BMI 36.57 kg/m   Wt Readings from Last 3 Encounters:  01/23/18 206 lb 6 oz (93.6 kg)  07/22/17 203 lb (92.1 kg)  07/03/16 207 lb (93.9 kg)    Physical Exam  Constitutional: She is oriented to person, place, and time. She appears well-developed and well-nourished. No distress.  HENT:  Head: Normocephalic and atraumatic.  Right Ear:  Hearing normal.  Left Ear: Hearing normal.  Nose: Nose normal.  Eyes: Conjunctivae and lids are normal. Right eye exhibits no discharge. Left eye exhibits no discharge. No scleral icterus.  Cardiovascular: Normal rate, regular rhythm and intact distal pulses. Exam reveals no gallop and no friction rub.  Murmur heard. Pulmonary/Chest: Effort normal and breath sounds normal. No stridor. No respiratory distress. She has no wheezes. She has no rales. She exhibits no tenderness.  Musculoskeletal: Normal range of motion.  Neurological: She is alert and oriented to person, place, and time.  Skin: Skin is warm, dry and intact. Capillary refill takes less than 2 seconds. No rash noted. She is not diaphoretic. No erythema. No pallor.  Psychiatric: She has a normal mood and affect. Her speech is normal and behavior is normal. Judgment and thought content normal. Cognition and memory are normal.  Nursing note and vitals reviewed.   Results for orders placed or performed in visit on 07/22/17  CBC with Differential/Platelet  Result Value Ref Range   WBC 5.9 3.4 - 10.8 x10E3/uL   RBC 4.11 3.77 - 5.28 x10E6/uL   Hemoglobin 13.6 11.1 - 15.9 g/dL   Hematocrit 39.5 34.0 - 46.6 %   MCV 96 79 - 97 fL   MCH 33.1 (H) 26.6 - 33.0 pg   MCHC 34.4 31.5 - 35.7 g/dL   RDW 13.0 12.3 - 15.4 %   Platelets 192 150 - 379 x10E3/uL   Neutrophils 51 Not Estab. %   Lymphs 33 Not Estab. %   Monocytes 10  Not Estab. %   Eos 4 Not Estab. %   Basos 1 Not Estab. %   Neutrophils Absolute 3.1 1.4 - 7.0 x10E3/uL   Lymphocytes Absolute 2.0 0.7 - 3.1 x10E3/uL   Monocytes Absolute 0.6 0.1 - 0.9 x10E3/uL   EOS (ABSOLUTE) 0.2 0.0 - 0.4 x10E3/uL   Basophils Absolute 0.0 0.0 - 0.2 x10E3/uL   Immature Granulocytes 1 Not Estab. %   Immature Grans (Abs) 0.0 0.0 - 0.1 x10E3/uL  Comprehensive metabolic panel  Result Value Ref Range   Glucose 104 (H) 65 - 99 mg/dL   BUN 13 8 - 27 mg/dL   Creatinine, Ser 0.59 0.57 - 1.00 mg/dL   GFR  calc non Af Amer 91 >59 mL/min/1.73   GFR calc Af Amer 104 >59 mL/min/1.73   BUN/Creatinine Ratio 22 12 - 28   Sodium 143 134 - 144 mmol/L   Potassium 4.0 3.5 - 5.2 mmol/L   Chloride 103 96 - 106 mmol/L   CO2 24 20 - 29 mmol/L   Calcium 9.1 8.7 - 10.3 mg/dL   Total Protein 6.5 6.0 - 8.5 g/dL   Albumin 4.2 3.5 - 4.8 g/dL   Globulin, Total 2.3 1.5 - 4.5 g/dL   Albumin/Globulin Ratio 1.8 1.2 - 2.2   Bilirubin Total 0.5 0.0 - 1.2 mg/dL   Alkaline Phosphatase 70 39 - 117 IU/L   AST 17 0 - 40 IU/L   ALT 15 0 - 32 IU/L  Lipid panel  Result Value Ref Range   Cholesterol, Total 218 (H) 100 - 199 mg/dL   Triglycerides 76 0 - 149 mg/dL   HDL 82 >39 mg/dL   VLDL Cholesterol Cal 15 5 - 40 mg/dL   LDL Calculated 121 (H) 0 - 99 mg/dL   Chol/HDL Ratio 2.7 0.0 - 4.4 ratio  TSH  Result Value Ref Range   TSH 3.250 0.450 - 4.500 uIU/mL  Urinalysis, Routine w reflex microscopic  Result Value Ref Range   Specific Gravity, UA <1.005 (L) 1.005 - 1.030   pH, UA 6.5 5.0 - 7.5   Color, UA Yellow Yellow   Appearance Ur Clear Clear   Leukocytes, UA Negative Negative   Protein, UA Negative Negative/Trace   Glucose, UA Negative Negative   Ketones, UA Negative Negative   RBC, UA Negative Negative   Bilirubin, UA Negative Negative   Urobilinogen, Ur 0.2 0.2 - 1.0 mg/dL   Nitrite, UA Negative Negative      Assessment & Plan:   Problem List Items Addressed This Visit      Cardiovascular and Mediastinum   Hypertension - Primary    Under good control. Continue current regimen. Call with any concerns. Continue to monitor. Refills given.       Relevant Medications   amLODipine (NORVASC) 5 MG tablet   benazepril (LOTENSIN) 40 MG tablet   metoprolol succinate (TOPROL-XL) 25 MG 24 hr tablet   Other Relevant Orders   CBC with Differential/Platelet   Comprehensive metabolic panel     Other   Hypercholesteremia    Under good control. Continue current regimen. Call with any concerns. Continue to  monitor. Refills given.       Relevant Medications   amLODipine (NORVASC) 5 MG tablet   benazepril (LOTENSIN) 40 MG tablet   metoprolol succinate (TOPROL-XL) 25 MG 24 hr tablet   Other Relevant Orders   CBC with Differential/Platelet   Comprehensive metabolic panel   Lipid Panel w/o Chol/HDL Ratio  Follow up plan: Return in about 6 months (around 07/26/2018) for Wellness/follow up.

## 2018-01-24 ENCOUNTER — Encounter: Payer: Self-pay | Admitting: Family Medicine

## 2018-01-24 LAB — COMPREHENSIVE METABOLIC PANEL
ALT: 22 IU/L (ref 0–32)
AST: 24 IU/L (ref 0–40)
Albumin/Globulin Ratio: 1.8 (ref 1.2–2.2)
Albumin: 4.4 g/dL (ref 3.5–4.8)
Alkaline Phosphatase: 82 IU/L (ref 39–117)
BUN/Creatinine Ratio: 18 (ref 12–28)
BUN: 12 mg/dL (ref 8–27)
Bilirubin Total: 0.5 mg/dL (ref 0.0–1.2)
CO2: 24 mmol/L (ref 20–29)
Calcium: 9.9 mg/dL (ref 8.7–10.3)
Chloride: 98 mmol/L (ref 96–106)
Creatinine, Ser: 0.66 mg/dL (ref 0.57–1.00)
GFR calc Af Amer: 100 mL/min/{1.73_m2} (ref >59)
GFR calc non Af Amer: 87 mL/min/{1.73_m2} (ref >59)
Globulin, Total: 2.5 g/dL (ref 1.5–4.5)
Glucose: 108 mg/dL — ABNORMAL HIGH (ref 65–99)
Potassium: 4 mmol/L (ref 3.5–5.2)
Sodium: 137 mmol/L (ref 134–144)
Total Protein: 6.9 g/dL (ref 6.0–8.5)

## 2018-01-24 LAB — CBC WITH DIFFERENTIAL/PLATELET
Basophils Absolute: 0 10*3/uL (ref 0.0–0.2)
Basos: 1 %
EOS (ABSOLUTE): 0.3 10*3/uL (ref 0.0–0.4)
Eos: 4 %
Hematocrit: 41.6 % (ref 34.0–46.6)
Hemoglobin: 14.3 g/dL (ref 11.1–15.9)
Immature Grans (Abs): 0 10*3/uL (ref 0.0–0.1)
Immature Granulocytes: 1 %
Lymphocytes Absolute: 2.2 10*3/uL (ref 0.7–3.1)
Lymphs: 34 %
MCH: 32.9 pg (ref 26.6–33.0)
MCHC: 34.4 g/dL (ref 31.5–35.7)
MCV: 96 fL (ref 79–97)
Monocytes Absolute: 0.6 10*3/uL (ref 0.1–0.9)
Monocytes: 10 %
Neutrophils Absolute: 3.2 10*3/uL (ref 1.4–7.0)
Neutrophils: 50 %
Platelets: 238 10*3/uL (ref 150–450)
RBC: 4.35 x10E6/uL (ref 3.77–5.28)
RDW: 13.3 % (ref 12.3–15.4)
WBC: 6.3 10*3/uL (ref 3.4–10.8)

## 2018-01-24 LAB — LIPID PANEL W/O CHOL/HDL RATIO
Cholesterol, Total: 213 mg/dL — ABNORMAL HIGH (ref 100–199)
HDL: 75 mg/dL (ref >39)
LDL Calculated: 123 mg/dL — ABNORMAL HIGH (ref 0–99)
Triglycerides: 74 mg/dL (ref 0–149)
VLDL Cholesterol Cal: 15 mg/dL (ref 5–40)

## 2018-01-29 ENCOUNTER — Ambulatory Visit
Admission: RE | Admit: 2018-01-29 | Discharge: 2018-01-29 | Disposition: A | Discharge status: Home / Self Care | Payer: Medicare Other | Source: Ambulatory Visit | Attending: Surgery | Admitting: Surgery

## 2018-01-29 DIAGNOSIS — Z1231 Encounter for screening mammogram for malignant neoplasm of breast: Secondary | ICD-10-CM | POA: Diagnosis not present

## 2018-01-29 IMAGING — MG MM DIGITAL SCREENING BILAT W/ TOMO W/ CAD
8 series · 8 of 24 positions shown · non-contrast
Comparison: Previous exam(s).

CLINICAL DATA: Screening.

EXAM:
DIGITAL SCREENING BILATERAL MAMMOGRAM WITH TOMO AND CAD

[L CC synth-2D]
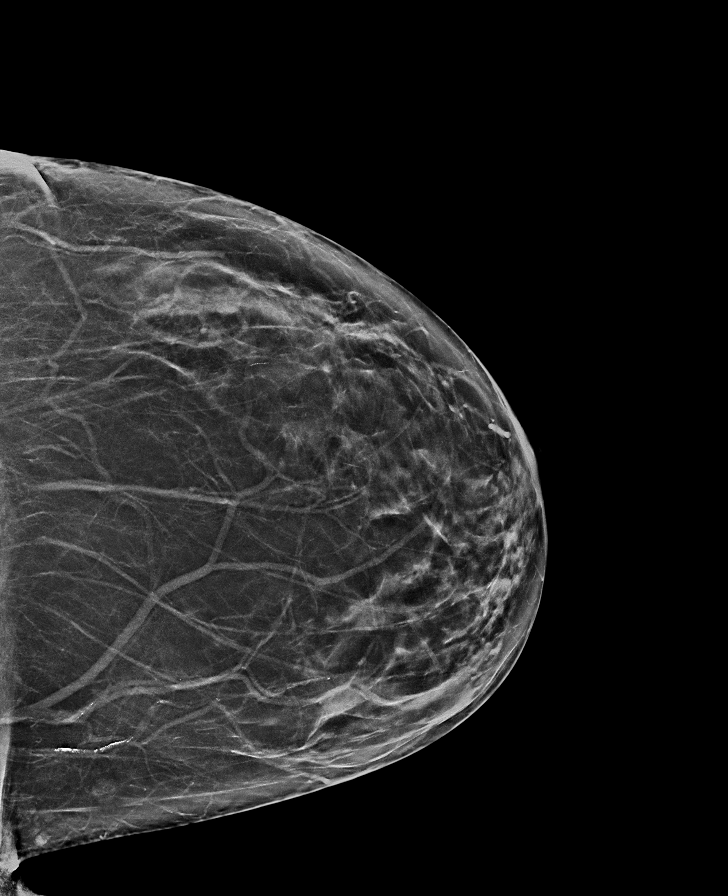

[R CC synth-2D]
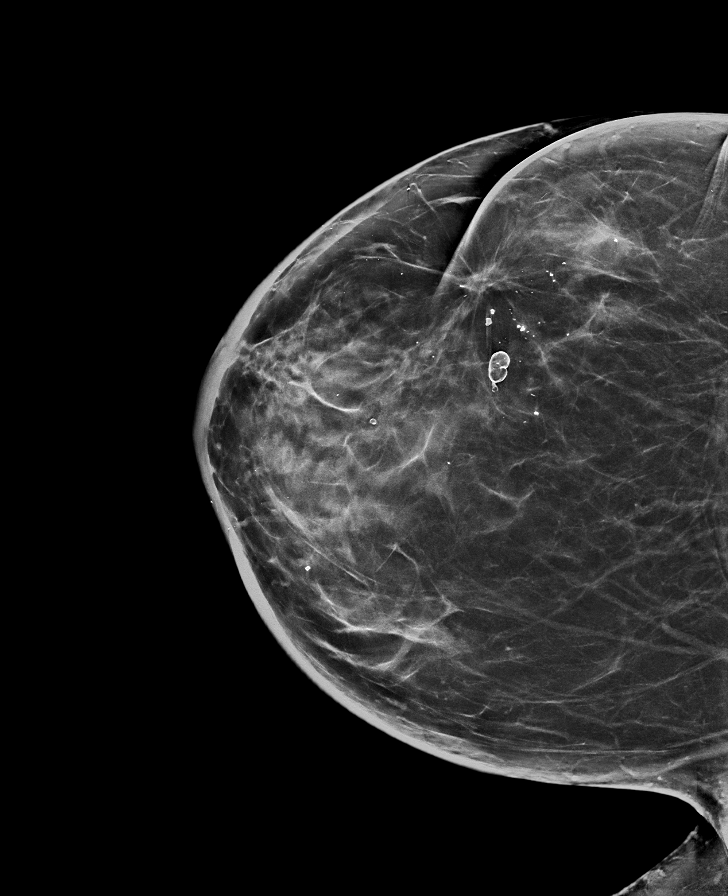

[R MLO synth-2D]
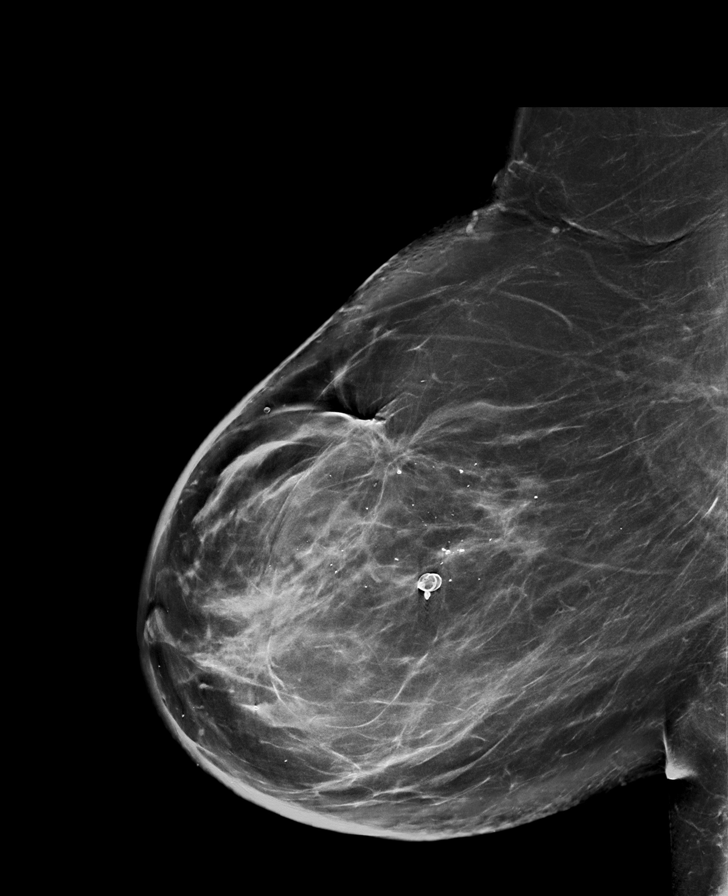

[L MLO synth-2D]
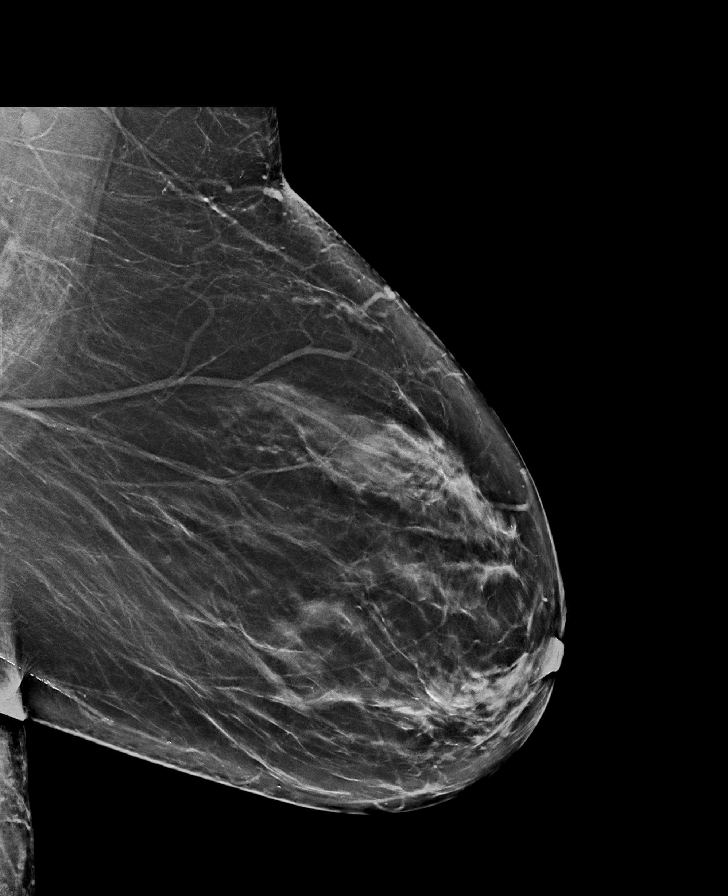

[L MLO tomo · tomo slice 40/79.0]
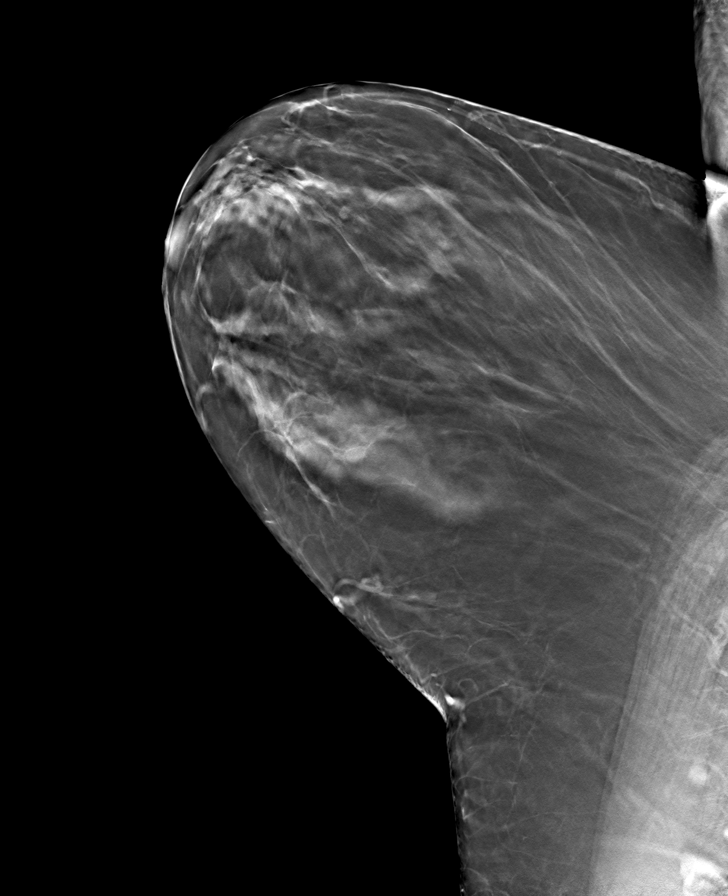

[R MLO tomo · tomo slice 51/100.0]
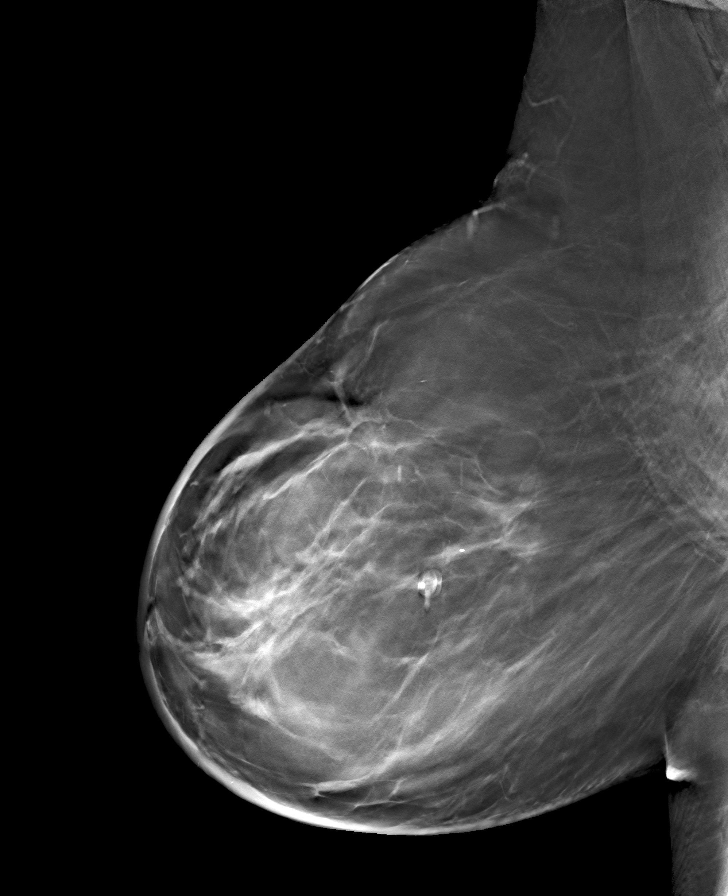

[R CC tomo · tomo slice 43/86.0]
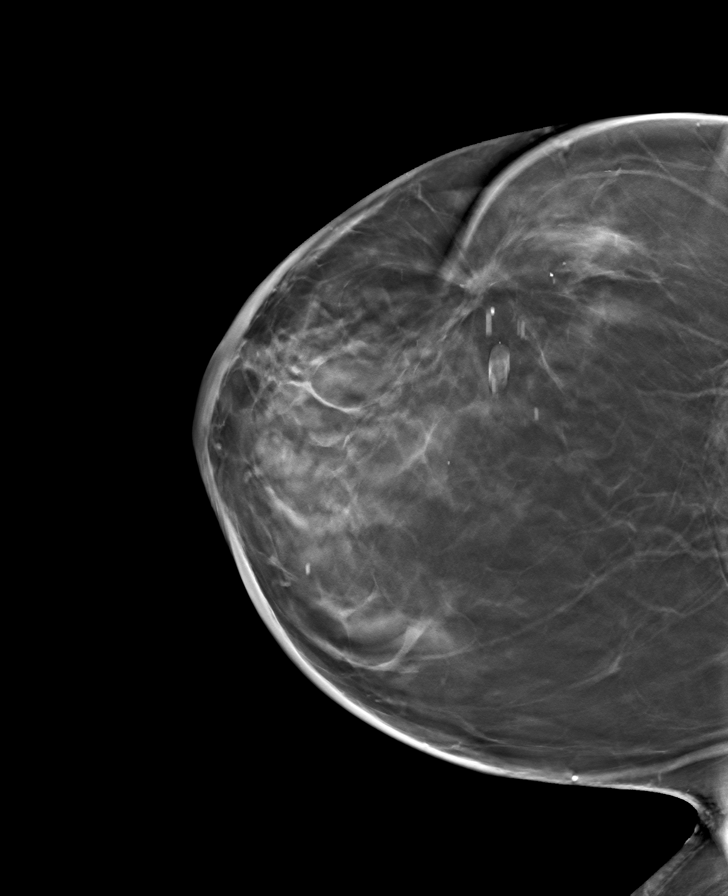

[L CC tomo · tomo slice 35/70.0]
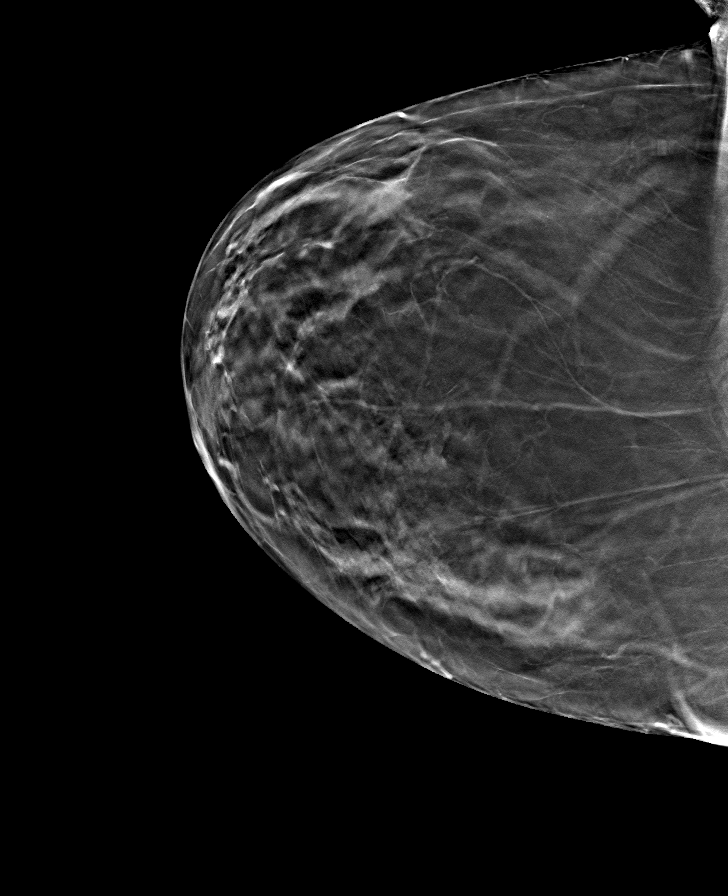

[8 of 24 positions shown; findings below may reference images not displayed]

ACR Breast Density Category b: There are scattered areas of
fibroglandular density.
FINDINGS: There are no findings suspicious for malignancy. Images were
processed with CAD.
IMPRESSION: No mammographic evidence of malignancy. A result letter of this
screening mammogram will be mailed directly to the patient.

RECOMMENDATION:
Screening mammogram in one year. (Code:[TQ])

BI-RADS CATEGORY  1: Negative.

## 2018-02-12 DIAGNOSIS — C50011 Malignant neoplasm of nipple and areola, right female breast: Secondary | ICD-10-CM | POA: Diagnosis not present

## 2018-03-03 DIAGNOSIS — Z23 Encounter for immunization: Secondary | ICD-10-CM | POA: Diagnosis not present

## 2018-05-12 DIAGNOSIS — E039 Hypothyroidism, unspecified: Secondary | ICD-10-CM | POA: Diagnosis not present

## 2018-05-19 DIAGNOSIS — E039 Hypothyroidism, unspecified: Secondary | ICD-10-CM | POA: Diagnosis not present

## 2018-07-28 ENCOUNTER — Ambulatory Visit (INDEPENDENT_AMBULATORY_CARE_PROVIDER_SITE_OTHER): Payer: Medicare Other | Admitting: Family Medicine

## 2018-07-28 ENCOUNTER — Encounter: Payer: Self-pay | Admitting: Family Medicine

## 2018-07-28 VITALS — BP 159/87 | HR 98 | Temp 98.4°F | Ht 63.0 in | Wt 210.0 lb

## 2018-07-28 DIAGNOSIS — E7800 Pure hypercholesterolemia, unspecified: Secondary | ICD-10-CM | POA: Diagnosis not present

## 2018-07-28 DIAGNOSIS — E0590 Thyrotoxicosis, unspecified without thyrotoxic crisis or storm: Secondary | ICD-10-CM

## 2018-07-28 DIAGNOSIS — C50911 Malignant neoplasm of unspecified site of right female breast: Secondary | ICD-10-CM | POA: Diagnosis not present

## 2018-07-28 DIAGNOSIS — I350 Nonrheumatic aortic (valve) stenosis: Secondary | ICD-10-CM

## 2018-07-28 DIAGNOSIS — Z7189 Other specified counseling: Secondary | ICD-10-CM | POA: Diagnosis not present

## 2018-07-28 DIAGNOSIS — I10 Essential (primary) hypertension: Secondary | ICD-10-CM

## 2018-07-28 LAB — MICROSCOPIC EXAMINATION

## 2018-07-28 LAB — URINALYSIS, ROUTINE W REFLEX MICROSCOPIC
Bilirubin, UA: NEGATIVE
Glucose, UA: NEGATIVE
Ketones, UA: NEGATIVE
Nitrite, UA: NEGATIVE
Protein, UA: NEGATIVE
RBC, UA: NEGATIVE
Specific Gravity, UA: 1.01 (ref 1.005–1.030)
Urobilinogen, Ur: 0.2 mg/dL (ref 0.2–1.0)
pH, UA: 6 (ref 5.0–7.5)

## 2018-07-28 MED ORDER — AMLODIPINE BESYLATE 5 MG PO TABS: 5.0000 mg | ORAL_TABLET | Freq: Every day | ORAL | 4 refills | Status: DC

## 2018-07-28 MED ORDER — BENAZEPRIL HCL 40 MG PO TABS: 40.0000 mg | ORAL_TABLET | Freq: Every day | ORAL | 4 refills | Status: DC

## 2018-07-28 MED ORDER — METOPROLOL SUCCINATE ER 50 MG PO TB24: 50.0000 mg | ORAL_TABLET | Freq: Every day | ORAL | 4 refills | Status: DC

## 2018-07-28 NOTE — Assessment & Plan Note (Signed)
Followed by endocrinology and stable 

## 2018-07-28 NOTE — Assessment & Plan Note (Signed)
Poor control of hypertension will increase metoprolol from 25 mg to 50 mg and recheck in a month or so.

## 2018-07-28 NOTE — Assessment & Plan Note (Signed)
Followed by surgery

## 2018-07-28 NOTE — Progress Notes (Signed)
BP (!) 159/87   Pulse 98   Temp 98.4 F (36.9 C) (Oral)   Ht 5\' 3"  (1.6 m)   Wt 210 lb (95.3 kg)   SpO2 98%   BMI 37.20 kg/m    Subjective:    Patient ID: Maria Davies, female    DOB: July 09, 1940, 78 y.o.   MRN: [id number]  HPI: Maria Davies is a 78 y.o. female  Chief Complaint  Patient presents with  . Annual Exam  . Heart Murmur    Worsening per [place]  Patient follow-up on review heart murmur related from endocrinology is 3/6 patient has not really noticed any changes.  No CHF symptoms. Has not had cardiac echo in almost 2 years. Breast cancer follow-up doing well no complaints or concerns having normal exams. Blood pressure not checking at home.  No complaints from medications.  Relevant past medical, surgical, family and social history reviewed and updated as indicated. Interim medical history since our last visit reviewed. Allergies and medications reviewed and updated.  Review of Systems  Constitutional: Negative.   HENT: Negative.   Eyes: Negative.   Respiratory: Negative.   Cardiovascular: Negative.   Gastrointestinal: Negative.   Endocrine: Negative.   Genitourinary: Negative.   Musculoskeletal: Negative.   Skin: Negative.   Allergic/Immunologic: Negative.   Neurological: Negative.   Hematological: Negative.   Psychiatric/Behavioral: Negative.     Per HPI unless specifically indicated above     Objective:    BP (!) 159/87   Pulse 98   Temp 98.4 F (36.9 C) (Oral)   Ht 5\' 3"  (1.6 m)   Wt 210 lb (95.3 kg)   SpO2 98%   BMI 37.20 kg/m   Wt Readings from Last 3 Encounters:  07/28/18 210 lb (95.3 kg)  01/23/18 206 lb 6 oz (93.6 kg)  07/22/17 203 lb (92.1 kg)    Physical Exam Constitutional:      Appearance: She is well-developed.  HENT:     Head: Normocephalic and atraumatic.     Right Ear: External ear normal.     Left Ear: External ear normal.     Nose: Nose normal.  Eyes:     Conjunctiva/sclera: Conjunctivae normal.     Pupils:  Pupils are equal, round, and reactive to light.  Neck:     Musculoskeletal: Normal range of motion and neck supple.     Vascular: No carotid bruit.  Cardiovascular:     Rate and Rhythm: Normal rate and regular rhythm.     Heart sounds: Murmur present. Systolic murmur present with a grade of 3/6.  Pulmonary:     Effort: Pulmonary effort is normal.     Breath sounds: Normal breath sounds.  Abdominal:     General: Bowel sounds are normal.     Palpations: Abdomen is soft.  Musculoskeletal: Normal range of motion.     Right lower leg: No edema.     Left lower leg: No edema.  Skin:    Findings: No rash.  Neurological:     Mental Status: She is alert and oriented to person, place, and time.  Psychiatric:        Behavior: Behavior normal.        Thought Content: Thought content normal.        Judgment: Judgment normal.     Results for orders placed or performed in visit on 01/23/18  CBC with Differential/Platelet  Result Value Ref Range   WBC 6.3 3.4 - 10.8 x10E3/uL   RBC  4.35 3.77 - 5.28 x10E6/uL   Hemoglobin 14.3 11.1 - 15.9 g/dL   Hematocrit 41.6 34.0 - 46.6 %   MCV 96 79 - 97 fL   MCH 32.9 26.6 - 33.0 pg   MCHC 34.4 31.5 - 35.7 g/dL   RDW 13.3 12.3 - 15.4 %   Platelets 238 150 - 450 x10E3/uL   Neutrophils 50 Not Estab. %   Lymphs 34 Not Estab. %   Monocytes 10 Not Estab. %   Eos 4 Not Estab. %   Basos 1 Not Estab. %   Neutrophils Absolute 3.2 1.4 - 7.0 x10E3/uL   Lymphocytes Absolute 2.2 0.7 - 3.1 x10E3/uL   Monocytes Absolute 0.6 0.1 - 0.9 x10E3/uL   EOS (ABSOLUTE) 0.3 0.0 - 0.4 x10E3/uL   Basophils Absolute 0.0 0.0 - 0.2 x10E3/uL   Immature Granulocytes 1 Not Estab. %   Immature Grans (Abs) 0.0 0.0 - 0.1 x10E3/uL  Comprehensive metabolic panel  Result Value Ref Range   Glucose 108 (H) 65 - 99 mg/dL   BUN 12 8 - 27 mg/dL   Creatinine, Ser 0.66 0.57 - 1.00 mg/dL   GFR calc non Af Amer 87 >59 mL/min/1.73   GFR calc Af Amer 100 >59 mL/min/1.73   BUN/Creatinine  Ratio 18 12 - 28   Sodium 137 134 - 144 mmol/L   Potassium 4.0 3.5 - 5.2 mmol/L   Chloride 98 96 - 106 mmol/L   CO2 24 20 - 29 mmol/L   Calcium 9.9 8.7 - 10.3 mg/dL   Total Protein 6.9 6.0 - 8.5 g/dL   Albumin 4.4 3.5 - 4.8 g/dL   Globulin, Total 2.5 1.5 - 4.5 g/dL   Albumin/Globulin Ratio 1.8 1.2 - 2.2   Bilirubin Total 0.5 0.0 - 1.2 mg/dL   Alkaline Phosphatase 82 39 - 117 IU/L   AST 24 0 - 40 IU/L   ALT 22 0 - 32 IU/L  Lipid Panel w/o Chol/HDL Ratio  Result Value Ref Range   Cholesterol, Total 213 (H) 100 - 199 mg/dL   Triglycerides 74 0 - 149 mg/dL   HDL 75 >39 mg/dL   VLDL Cholesterol Cal 15 5 - 40 mg/dL   LDL Calculated 123 (H) 0 - 99 mg/dL      Assessment & Plan:   Problem List Items Addressed This Visit      Cardiovascular and Mediastinum   Hypertension - Primary     Endocrine   Hyperthyroidism     Other   Hypercholesteremia       Follow up plan: No follow-ups on file.

## 2018-07-28 NOTE — Assessment & Plan Note (Signed)
Murmur may be a little more prominent will refer back to cardiology to further evaluate patient with no CHF symptoms.

## 2018-07-28 NOTE — Assessment & Plan Note (Signed)
A voluntary discussion about advanced care planning including explanation and discussion of advanced directives was extentively discussed with the patient.  Explained about the healthcare proxy and living will was reviewed and packet with forms with expiration of how to fill them out was given.  Time spent: Encounter 16+ min individuals present: Patient 

## 2018-07-28 NOTE — Assessment & Plan Note (Signed)
The current medical regimen is effective;  continue present plan and medications.  

## 2018-07-29 ENCOUNTER — Encounter: Payer: Self-pay | Admitting: Family Medicine

## 2018-07-29 LAB — COMPREHENSIVE METABOLIC PANEL
ALT: 14 IU/L (ref 0–32)
AST: 20 IU/L (ref 0–40)
Albumin/Globulin Ratio: 1.7 (ref 1.2–2.2)
Albumin: 4.2 g/dL (ref 3.7–4.7)
Alkaline Phosphatase: 79 IU/L (ref 39–117)
BUN/Creatinine Ratio: 17 (ref 12–28)
BUN: 10 mg/dL (ref 8–27)
Bilirubin Total: 0.5 mg/dL (ref 0.0–1.2)
CO2: 23 mmol/L (ref 20–29)
Calcium: 9.3 mg/dL (ref 8.7–10.3)
Chloride: 100 mmol/L (ref 96–106)
Creatinine, Ser: 0.59 mg/dL (ref 0.57–1.00)
GFR calc Af Amer: 104 mL/min/{1.73_m2} (ref >59)
GFR calc non Af Amer: 90 mL/min/{1.73_m2} (ref >59)
Globulin, Total: 2.5 g/dL (ref 1.5–4.5)
Glucose: 125 mg/dL — ABNORMAL HIGH (ref 65–99)
Potassium: 3.9 mmol/L (ref 3.5–5.2)
Sodium: 139 mmol/L (ref 134–144)
Total Protein: 6.7 g/dL (ref 6.0–8.5)

## 2018-07-29 LAB — CBC WITH DIFFERENTIAL/PLATELET
Basophils Absolute: 0.1 10*3/uL (ref 0.0–0.2)
Basos: 1 %
EOS (ABSOLUTE): 0.2 10*3/uL (ref 0.0–0.4)
Eos: 2 %
Hematocrit: 39.1 % (ref 34.0–46.6)
Hemoglobin: 13.5 g/dL (ref 11.1–15.9)
Immature Grans (Abs): 0 10*3/uL (ref 0.0–0.1)
Immature Granulocytes: 1 %
Lymphocytes Absolute: 1.7 10*3/uL (ref 0.7–3.1)
Lymphs: 27 %
MCH: 32.2 pg (ref 26.6–33.0)
MCHC: 34.5 g/dL (ref 31.5–35.7)
MCV: 93 fL (ref 79–97)
Monocytes Absolute: 0.7 10*3/uL (ref 0.1–0.9)
Monocytes: 10 %
Neutrophils Absolute: 3.9 10*3/uL (ref 1.4–7.0)
Neutrophils: 59 %
Platelets: 227 10*3/uL (ref 150–450)
RBC: 4.19 x10E6/uL (ref 3.77–5.28)
RDW: 11.7 % (ref 11.7–15.4)
WBC: 6.5 10*3/uL (ref 3.4–10.8)

## 2018-07-29 LAB — LIPID PANEL
Chol/HDL Ratio: 2.3 ratio (ref 0.0–4.4)
Cholesterol, Total: 208 mg/dL — ABNORMAL HIGH (ref 100–199)
HDL: 90 mg/dL (ref >39)
LDL Calculated: 103 mg/dL — ABNORMAL HIGH (ref 0–99)
Triglycerides: 77 mg/dL (ref 0–149)
VLDL Cholesterol Cal: 15 mg/dL (ref 5–40)

## 2018-08-08 DIAGNOSIS — I10 Essential (primary) hypertension: Secondary | ICD-10-CM | POA: Diagnosis not present

## 2018-08-08 DIAGNOSIS — E0590 Thyrotoxicosis, unspecified without thyrotoxic crisis or storm: Secondary | ICD-10-CM | POA: Diagnosis not present

## 2018-08-08 DIAGNOSIS — R011 Cardiac murmur, unspecified: Secondary | ICD-10-CM | POA: Diagnosis not present

## 2018-08-08 DIAGNOSIS — E7800 Pure hypercholesterolemia, unspecified: Secondary | ICD-10-CM | POA: Diagnosis not present

## 2018-08-08 DIAGNOSIS — I350 Nonrheumatic aortic (valve) stenosis: Secondary | ICD-10-CM | POA: Diagnosis not present

## 2018-08-22 DIAGNOSIS — I350 Nonrheumatic aortic (valve) stenosis: Secondary | ICD-10-CM | POA: Diagnosis not present

## 2018-08-26 ENCOUNTER — Encounter: Payer: Self-pay | Admitting: Family Medicine

## 2018-08-26 ENCOUNTER — Ambulatory Visit (INDEPENDENT_AMBULATORY_CARE_PROVIDER_SITE_OTHER): Payer: Medicare Other | Admitting: Family Medicine

## 2018-08-26 DIAGNOSIS — I350 Nonrheumatic aortic (valve) stenosis: Secondary | ICD-10-CM

## 2018-08-26 DIAGNOSIS — I10 Essential (primary) hypertension: Secondary | ICD-10-CM

## 2018-08-26 DIAGNOSIS — R7303 Prediabetes: Secondary | ICD-10-CM | POA: Insufficient documentation

## 2018-08-26 NOTE — Assessment & Plan Note (Signed)
The current medical regimen is effective;  continue present plan and medications.  

## 2018-08-26 NOTE — Assessment & Plan Note (Signed)
Discussed care and tx

## 2018-08-26 NOTE — Progress Notes (Addendum)
BP 136/78 (BP Location: Left Arm)   Pulse 82   Temp 98.1 F (36.7 C) (Oral)   Wt 208 lb 12.8 oz (94.7 kg)   SpO2 96%   BMI 36.99 kg/m    Subjective:    Patient ID: Maria Davies, female    DOB: [birth year], 78 y.o.   MRN: [id number]  HPI: Maria Davies is a 78 y.o. female  Chief Complaint  Patient presents with  . Hypertension    4 week f/up   Follow-up increased dose of metoprolol doing well with no complaints.  Actually feeling a little better fewer palpitations. Also reviewed previous to cardiac echoes.  Patient has appointment with [facility] coming up Patient otherwise doing well. Reviewed blood work and patient's glucose was slightly elevated discussed prediabetes and weight loss.  Relevant past medical, surgical, family and social history reviewed and updated as indicated. Interim medical history since our last visit reviewed. Allergies and medications reviewed and updated.  Review of Systems  Constitutional: Negative.   Respiratory: Negative.   Cardiovascular: Negative.     Per HPI unless specifically indicated above     Objective:    BP 136/78 (BP Location: Left Arm)   Pulse 82   Temp 98.1 F (36.7 C) (Oral)   Wt 208 lb 12.8 oz (94.7 kg)   SpO2 96%   BMI 36.99 kg/m   Wt Readings from Last 3 Encounters:  08/26/18 208 lb 12.8 oz (94.7 kg)  07/28/18 210 lb (95.3 kg)  01/23/18 206 lb 6 oz (93.6 kg)    Physical Exam Constitutional:      Appearance: She is well-developed.  HENT:     Head: Normocephalic and atraumatic.  Eyes:     Conjunctiva/sclera: Conjunctivae normal.  Neck:     Musculoskeletal: Normal range of motion.  Cardiovascular:     Rate and Rhythm: Normal rate and regular rhythm.     Heart sounds: Normal heart sounds.  Pulmonary:     Effort: Pulmonary effort is normal.     Breath sounds: Normal breath sounds.  Musculoskeletal: Normal range of motion.  Skin:    Findings: No erythema.  Neurological:     Mental Status: She is alert and  oriented to person, place, and time.  Psychiatric:        Behavior: Behavior normal.        Thought Content: Thought content normal.        Judgment: Judgment normal.     Results for orders placed or performed in visit on 07/28/18  Microscopic Examination  Result Value Ref Range   WBC, UA 0-5 0 - 5 /hpf   RBC, UA 0-2 0 - 2 /hpf   Epithelial Cells (non renal) 0-10 0 - 10 /hpf   Renal Epithel, UA 0-10 (A) None seen /hpf   Bacteria, UA Few (A) None seen/Few  CBC with Differential/Platelet  Result Value Ref Range   WBC 6.5 3.4 - 10.8 x10E3/uL   RBC 4.19 3.77 - 5.28 x10E6/uL   Hemoglobin 13.5 11.1 - 15.9 g/dL   Hematocrit 39.1 34.0 - 46.6 %   MCV 93 79 - 97 fL   MCH 32.2 26.6 - 33.0 pg   MCHC 34.5 31.5 - 35.7 g/dL   RDW 11.7 11.7 - 15.4 %   Platelets 227 150 - 450 x10E3/uL   Neutrophils 59 Not Estab. %   Lymphs 27 Not Estab. %   Monocytes 10 Not Estab. %   Eos 2 Not Estab. %   Basos  1 Not Estab. %   Neutrophils Absolute 3.9 1.4 - 7.0 x10E3/uL   Lymphocytes Absolute 1.7 0.7 - 3.1 x10E3/uL   Monocytes Absolute 0.7 0.1 - 0.9 x10E3/uL   EOS (ABSOLUTE) 0.2 0.0 - 0.4 x10E3/uL   Basophils Absolute 0.1 0.0 - 0.2 x10E3/uL   Immature Granulocytes 1 Not Estab. %   Immature Grans (Abs) 0.0 0.0 - 0.1 x10E3/uL  Comprehensive metabolic panel  Result Value Ref Range   Glucose 125 (H) 65 - 99 mg/dL   BUN 10 8 - 27 mg/dL   Creatinine, Ser 0.59 0.57 - 1.00 mg/dL   GFR calc non Af Amer 90 >59 mL/min/1.73   GFR calc Af Amer 104 >59 mL/min/1.73   BUN/Creatinine Ratio 17 12 - 28   Sodium 139 134 - 144 mmol/L   Potassium 3.9 3.5 - 5.2 mmol/L   Chloride 100 96 - 106 mmol/L   CO2 23 20 - 29 mmol/L   Calcium 9.3 8.7 - 10.3 mg/dL   Total Protein 6.7 6.0 - 8.5 g/dL   Albumin 4.2 3.7 - 4.7 g/dL   Globulin, Total 2.5 1.5 - 4.5 g/dL   Albumin/Globulin Ratio 1.7 1.2 - 2.2   Bilirubin Total 0.5 0.0 - 1.2 mg/dL   Alkaline Phosphatase 79 39 - 117 IU/L   AST 20 0 - 40 IU/L   ALT 14 0 - 32 IU/L    Lipid panel  Result Value Ref Range   Cholesterol, Total 208 (H) 100 - 199 mg/dL   Triglycerides 77 0 - 149 mg/dL   HDL 90 >39 mg/dL   VLDL Cholesterol Cal 15 5 - 40 mg/dL   LDL Calculated 103 (H) 0 - 99 mg/dL   Chol/HDL Ratio 2.3 0.0 - 4.4 ratio  Urinalysis, Routine w reflex microscopic  Result Value Ref Range   Specific Gravity, UA 1.010 1.005 - 1.030   pH, UA 6.0 5.0 - 7.5   Color, UA Yellow Yellow   Appearance Ur Clear Clear   Leukocytes, UA 1+ (A) Negative   Protein, UA Negative Negative/Trace   Glucose, UA Negative Negative   Ketones, UA Negative Negative   RBC, UA Negative Negative   Bilirubin, UA Negative Negative   Urobilinogen, Ur 0.2 0.2 - 1.0 mg/dL   Nitrite, UA Negative Negative   Microscopic Examination See below:       Assessment & Plan:   Problem List Items Addressed This Visit      Cardiovascular and Mediastinum   Aortic stenosis, mild    The current medical regimen is effective;  continue present plan and medications.       Hypertension    The current medical regimen is effective;  continue present plan and medications.         Other   Pre-diabetes    Discussed care and tx          Follow up plan: Return in about 5 months (around 01/26/2019) for BMP,  Lipids, ALT, AST, Hemoglobin A1c.

## 2018-10-08 DIAGNOSIS — I350 Nonrheumatic aortic (valve) stenosis: Secondary | ICD-10-CM | POA: Diagnosis not present

## 2018-10-08 DIAGNOSIS — E7800 Pure hypercholesterolemia, unspecified: Secondary | ICD-10-CM | POA: Diagnosis not present

## 2018-10-08 DIAGNOSIS — I10 Essential (primary) hypertension: Secondary | ICD-10-CM | POA: Diagnosis not present

## 2018-11-17 DIAGNOSIS — E039 Hypothyroidism, unspecified: Secondary | ICD-10-CM | POA: Diagnosis not present

## 2018-11-24 DIAGNOSIS — E039 Hypothyroidism, unspecified: Secondary | ICD-10-CM | POA: Diagnosis not present

## 2018-12-29 DIAGNOSIS — L570 Actinic keratosis: Secondary | ICD-10-CM | POA: Diagnosis not present

## 2018-12-29 DIAGNOSIS — Z872 Personal history of diseases of the skin and subcutaneous tissue: Secondary | ICD-10-CM | POA: Diagnosis not present

## 2018-12-29 DIAGNOSIS — Z86018 Personal history of other benign neoplasm: Secondary | ICD-10-CM | POA: Diagnosis not present

## 2018-12-29 DIAGNOSIS — Z85828 Personal history of other malignant neoplasm of skin: Secondary | ICD-10-CM | POA: Diagnosis not present

## 2018-12-29 DIAGNOSIS — L578 Other skin changes due to chronic exposure to nonionizing radiation: Secondary | ICD-10-CM | POA: Diagnosis not present

## 2019-01-19 ENCOUNTER — Other Ambulatory Visit: Payer: Self-pay | Admitting: Surgery

## 2019-01-19 DIAGNOSIS — Z1231 Encounter for screening mammogram for malignant neoplasm of breast: Secondary | ICD-10-CM

## 2019-01-26 ENCOUNTER — Ambulatory Visit: Payer: Medicare Other | Admitting: Family Medicine

## 2019-01-28 ENCOUNTER — Encounter: Payer: Self-pay | Admitting: Family Medicine

## 2019-01-28 ENCOUNTER — Ambulatory Visit: Payer: Medicare Other | Admitting: Family Medicine

## 2019-01-28 ENCOUNTER — Other Ambulatory Visit: Payer: Self-pay

## 2019-01-28 ENCOUNTER — Ambulatory Visit (INDEPENDENT_AMBULATORY_CARE_PROVIDER_SITE_OTHER): Payer: Medicare Other | Admitting: Family Medicine

## 2019-01-28 VITALS — BP 128/82 | HR 76 | Temp 98.6°F | Ht 63.0 in | Wt 199.0 lb

## 2019-01-28 DIAGNOSIS — E7800 Pure hypercholesterolemia, unspecified: Secondary | ICD-10-CM | POA: Diagnosis not present

## 2019-01-28 DIAGNOSIS — I10 Essential (primary) hypertension: Secondary | ICD-10-CM | POA: Diagnosis not present

## 2019-01-28 DIAGNOSIS — R7303 Prediabetes: Secondary | ICD-10-CM

## 2019-01-28 NOTE — Assessment & Plan Note (Signed)
Diet controlled, continue good lifestyle modifications. Recheck lipids and adjust as needed

## 2019-01-28 NOTE — Assessment & Plan Note (Signed)
BPs stable and WNL, continue current regimen 

## 2019-01-28 NOTE — Assessment & Plan Note (Signed)
Diet controlled, recheck A1C and continue excellent lifestyle modifications. Congratulated weight loss. Adjust as needed

## 2019-01-28 NOTE — Progress Notes (Signed)
BP 128/82   Pulse 76   Temp 98.6 F (37 C) (Oral)   Ht 5\' 3"  (1.6 m)   Wt 199 lb (90.3 kg)   SpO2 99%   BMI 35.25 kg/m    Subjective:    Patient ID: Maria Davies, female    DOB: 10-12-1940, 78 y.o.   MRN: [id number]  HPI: Kimie Pidcock is a 78 y.o. female  Chief Complaint  Patient presents with  . Hypertension    f/u  . Hyperlipidemia  . A1c   HTN - does not check BPs at home. Takes her medicines faithfully without side effects. Denies CP, SOB, HAs, dizziness.   HLD and IFG diet controlled currently. No new concerns there. Has lost about 10 lb the past few months, eating better and staying active. No low blood sugar spells, polyuria, polydipsia.   Relevant past medical, surgical, family and social history reviewed and updated as indicated. Interim medical history since our last visit reviewed. Allergies and medications reviewed and updated.  Review of Systems  Per HPI unless specifically indicated above     Objective:    BP 128/82   Pulse 76   Temp 98.6 F (37 C) (Oral)   Ht 5\' 3"  (1.6 m)   Wt 199 lb (90.3 kg)   SpO2 99%   BMI 35.25 kg/m   Wt Readings from Last 3 Encounters:  01/28/19 199 lb (90.3 kg)  08/26/18 208 lb 12.8 oz (94.7 kg)  07/28/18 210 lb (95.3 kg)    Physical Exam Vitals signs and nursing note reviewed.  Constitutional:      Appearance: Normal appearance. She is not ill-appearing.  HENT:     Head: Atraumatic.  Eyes:     Extraocular Movements: Extraocular movements intact.     Conjunctiva/sclera: Conjunctivae normal.  Neck:     Musculoskeletal: Normal range of motion and neck supple.  Cardiovascular:     Rate and Rhythm: Normal rate and regular rhythm.     Heart sounds: Murmur present.  Pulmonary:     Effort: Pulmonary effort is normal.     Breath sounds: Normal breath sounds.  Musculoskeletal: Normal range of motion.  Skin:    General: Skin is warm and dry.  Neurological:     Mental Status: She is alert and oriented to  person, place, and time.  Psychiatric:        Mood and Affect: Mood normal.        Thought Content: Thought content normal.        Judgment: Judgment normal.     Results for orders placed or performed in visit on 07/28/18  Microscopic Examination   URINE  Result Value Ref Range   WBC, UA 0-5 0 - 5 /hpf   RBC, UA 0-2 0 - 2 /hpf   Epithelial Cells (non renal) 0-10 0 - 10 /hpf   Renal Epithel, UA 0-10 (A) None seen /hpf   Bacteria, UA Few (A) None seen/Few  CBC with Differential/Platelet  Result Value Ref Range   WBC 6.5 3.4 - 10.8 x10E3/uL   RBC 4.19 3.77 - 5.28 x10E6/uL   Hemoglobin 13.5 11.1 - 15.9 g/dL   Hematocrit 39.1 34.0 - 46.6 %   MCV 93 79 - 97 fL   MCH 32.2 26.6 - 33.0 pg   MCHC 34.5 31.5 - 35.7 g/dL   RDW 11.7 11.7 - 15.4 %   Platelets 227 150 - 450 x10E3/uL   Neutrophils 59 Not Estab. %   Lymphs  27 Not Estab. %   Monocytes 10 Not Estab. %   Eos 2 Not Estab. %   Basos 1 Not Estab. %   Neutrophils Absolute 3.9 1.4 - 7.0 x10E3/uL   Lymphocytes Absolute 1.7 0.7 - 3.1 x10E3/uL   Monocytes Absolute 0.7 0.1 - 0.9 x10E3/uL   EOS (ABSOLUTE) 0.2 0.0 - 0.4 x10E3/uL   Basophils Absolute 0.1 0.0 - 0.2 x10E3/uL   Immature Granulocytes 1 Not Estab. %   Immature Grans (Abs) 0.0 0.0 - 0.1 x10E3/uL  Comprehensive metabolic panel  Result Value Ref Range   Glucose 125 (H) 65 - 99 mg/dL   BUN 10 8 - 27 mg/dL   Creatinine, Ser 0.59 0.57 - 1.00 mg/dL   GFR calc non Af Amer 90 >59 mL/min/1.73   GFR calc Af Amer 104 >59 mL/min/1.73   BUN/Creatinine Ratio 17 12 - 28   Sodium 139 134 - 144 mmol/L   Potassium 3.9 3.5 - 5.2 mmol/L   Chloride 100 96 - 106 mmol/L   CO2 23 20 - 29 mmol/L   Calcium 9.3 8.7 - 10.3 mg/dL   Total Protein 6.7 6.0 - 8.5 g/dL   Albumin 4.2 3.7 - 4.7 g/dL   Globulin, Total 2.5 1.5 - 4.5 g/dL   Albumin/Globulin Ratio 1.7 1.2 - 2.2   Bilirubin Total 0.5 0.0 - 1.2 mg/dL   Alkaline Phosphatase 79 39 - 117 IU/L   AST 20 0 - 40 IU/L   ALT 14 0 - 32 IU/L   Lipid panel  Result Value Ref Range   Cholesterol, Total 208 (H) 100 - 199 mg/dL   Triglycerides 77 0 - 149 mg/dL   HDL 90 >39 mg/dL   VLDL Cholesterol Cal 15 5 - 40 mg/dL   LDL Calculated 103 (H) 0 - 99 mg/dL   Chol/HDL Ratio 2.3 0.0 - 4.4 ratio  Urinalysis, Routine w reflex microscopic  Result Value Ref Range   Specific Gravity, UA 1.010 1.005 - 1.030   pH, UA 6.0 5.0 - 7.5   Color, UA Yellow Yellow   Appearance Ur Clear Clear   Leukocytes, UA 1+ (A) Negative   Protein, UA Negative Negative/Trace   Glucose, UA Negative Negative   Ketones, UA Negative Negative   RBC, UA Negative Negative   Bilirubin, UA Negative Negative   Urobilinogen, Ur 0.2 0.2 - 1.0 mg/dL   Nitrite, UA Negative Negative   Microscopic Examination See below:       Assessment & Plan:   Problem List Items Addressed This Visit      Cardiovascular and Mediastinum   Hypertension - Primary    BPs stable and WNL, continue current regimen      Relevant Orders   Comprehensive metabolic panel     Other   Hypercholesteremia    Diet controlled, continue good lifestyle modifications. Recheck lipids and adjust as needed      Relevant Orders   Lipid Panel w/o Chol/HDL Ratio   Pre-diabetes    Diet controlled, recheck A1C and continue excellent lifestyle modifications. Congratulated weight loss. Adjust as needed      Relevant Orders   HgB A1c       Follow up plan: Return in about 6 months (around 07/31/2019) for 6 month f/u.

## 2019-01-29 ENCOUNTER — Encounter: Payer: Self-pay | Admitting: Family Medicine

## 2019-01-29 LAB — COMPREHENSIVE METABOLIC PANEL
ALT: 12 IU/L (ref 0–32)
AST: 15 IU/L (ref 0–40)
Albumin/Globulin Ratio: 2.1 (ref 1.2–2.2)
Albumin: 4.4 g/dL (ref 3.7–4.7)
Alkaline Phosphatase: 70 IU/L (ref 39–117)
BUN/Creatinine Ratio: 17 (ref 12–28)
BUN: 9 mg/dL (ref 8–27)
Bilirubin Total: 0.4 mg/dL (ref 0.0–1.2)
CO2: 22 mmol/L (ref 20–29)
Calcium: 9.4 mg/dL (ref 8.7–10.3)
Chloride: 100 mmol/L (ref 96–106)
Creatinine, Ser: 0.54 mg/dL — ABNORMAL LOW (ref 0.57–1.00)
GFR calc Af Amer: 106 mL/min/{1.73_m2} (ref >59)
GFR calc non Af Amer: 92 mL/min/{1.73_m2} (ref >59)
Globulin, Total: 2.1 g/dL (ref 1.5–4.5)
Glucose: 97 mg/dL (ref 65–99)
Potassium: 4.2 mmol/L (ref 3.5–5.2)
Sodium: 141 mmol/L (ref 134–144)
Total Protein: 6.5 g/dL (ref 6.0–8.5)

## 2019-01-29 LAB — HEMOGLOBIN A1C
Est. average glucose Bld gHb Est-mCnc: 103 mg/dL
Hgb A1c MFr Bld: 5.2 % (ref 4.8–5.6)

## 2019-01-29 LAB — LIPID PANEL W/O CHOL/HDL RATIO
Cholesterol, Total: 188 mg/dL (ref 100–199)
HDL: 73 mg/dL (ref >39)
LDL Calculated: 100 mg/dL — ABNORMAL HIGH (ref 0–99)
Triglycerides: 76 mg/dL (ref 0–149)
VLDL Cholesterol Cal: 15 mg/dL (ref 5–40)

## 2019-02-02 ENCOUNTER — Ambulatory Visit
Admission: RE | Admit: 2019-02-02 | Discharge: 2019-02-02 | Disposition: A | Discharge status: Home / Self Care | Payer: Medicare Other | Source: Ambulatory Visit | Attending: Surgery | Admitting: Surgery

## 2019-02-02 DIAGNOSIS — Z1231 Encounter for screening mammogram for malignant neoplasm of breast: Secondary | ICD-10-CM | POA: Diagnosis not present

## 2019-02-02 IMAGING — MG DIGITAL SCREENING BILATERAL MAMMOGRAM WITH TOMO AND CAD
6 of 10 series · 6 of 30 positions shown · non-contrast
Comparison: Previous exam(s).

CLINICAL DATA: Screening.

EXAM:
DIGITAL SCREENING BILATERAL MAMMOGRAM WITH TOMO AND CAD

[R MLO synth-2D (1 of 2)]
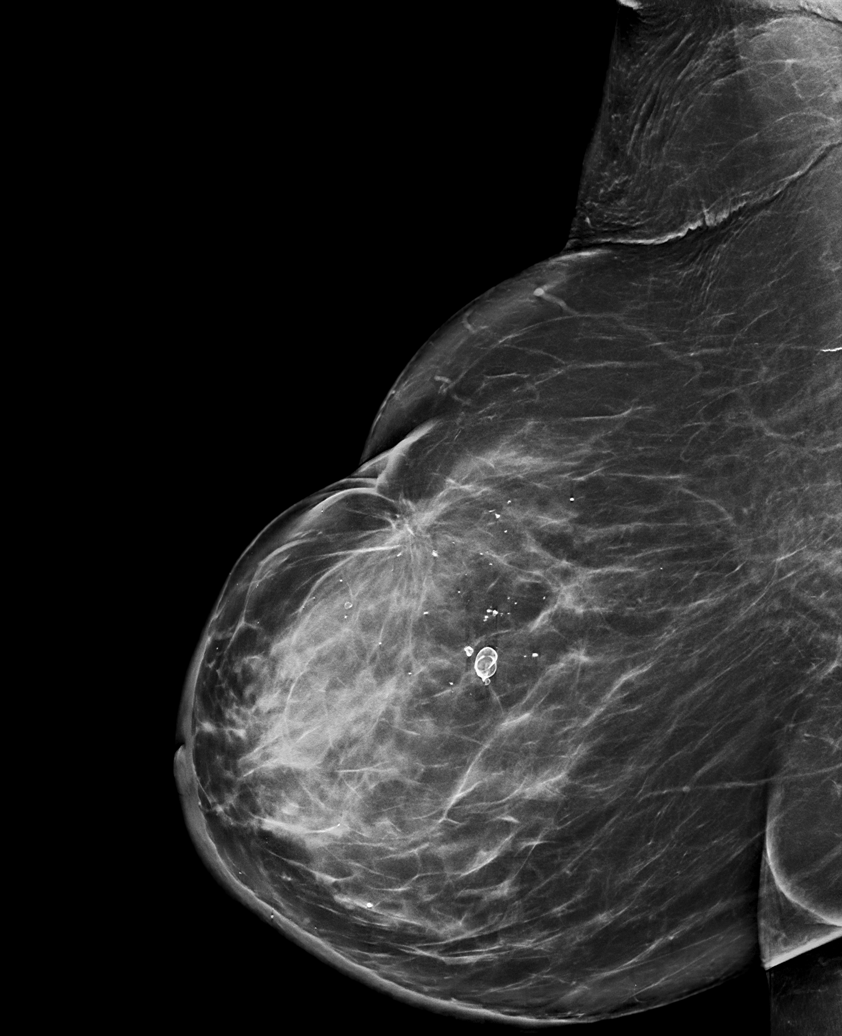

[L CC synth-2D]
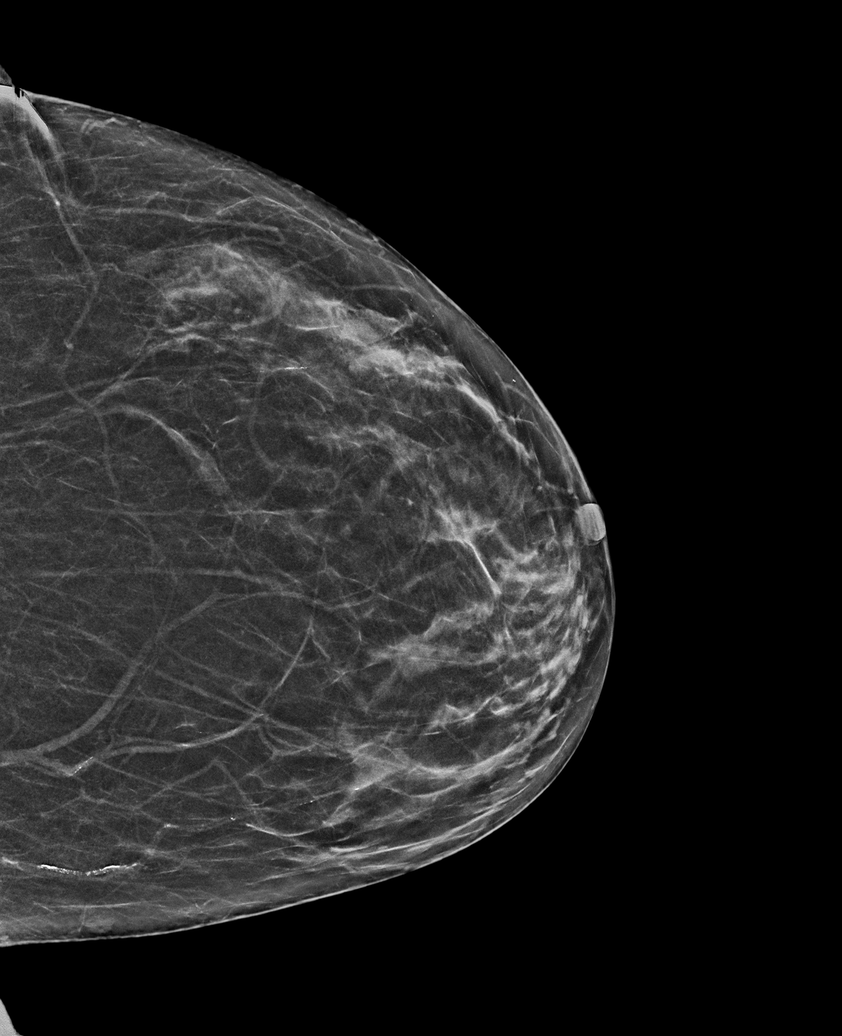

[R CC synth-2D]
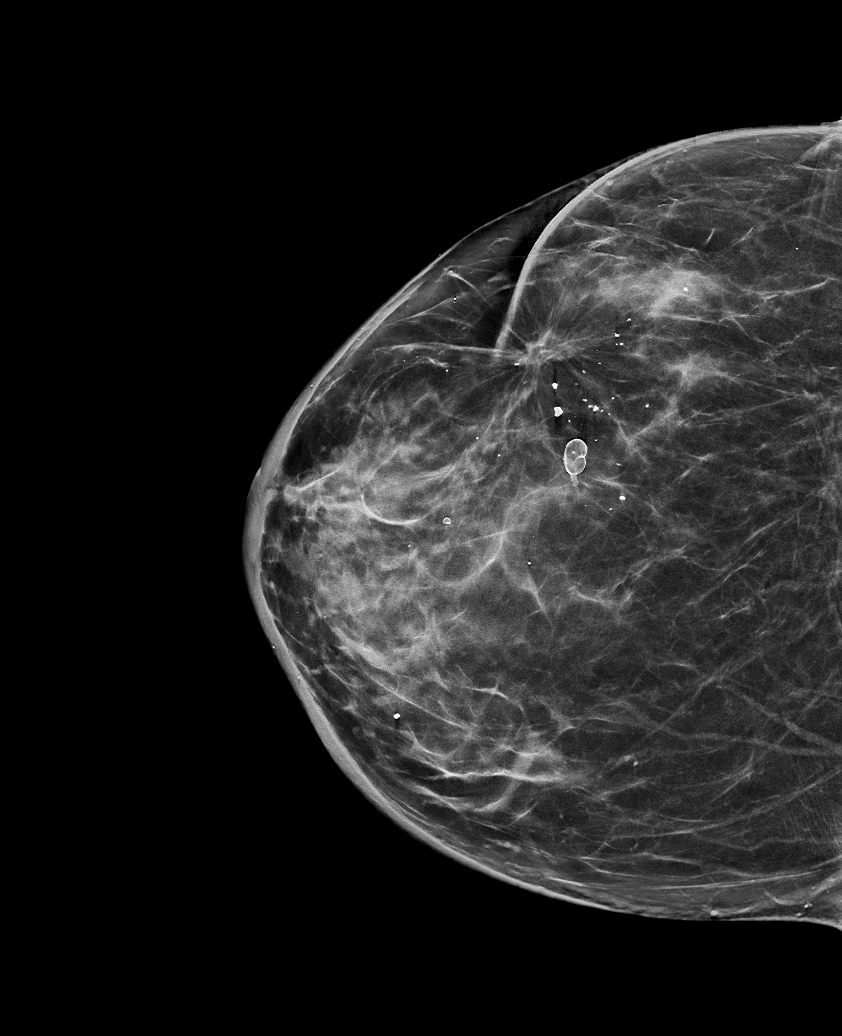

[R MLO synth-2D (2 of 2)]
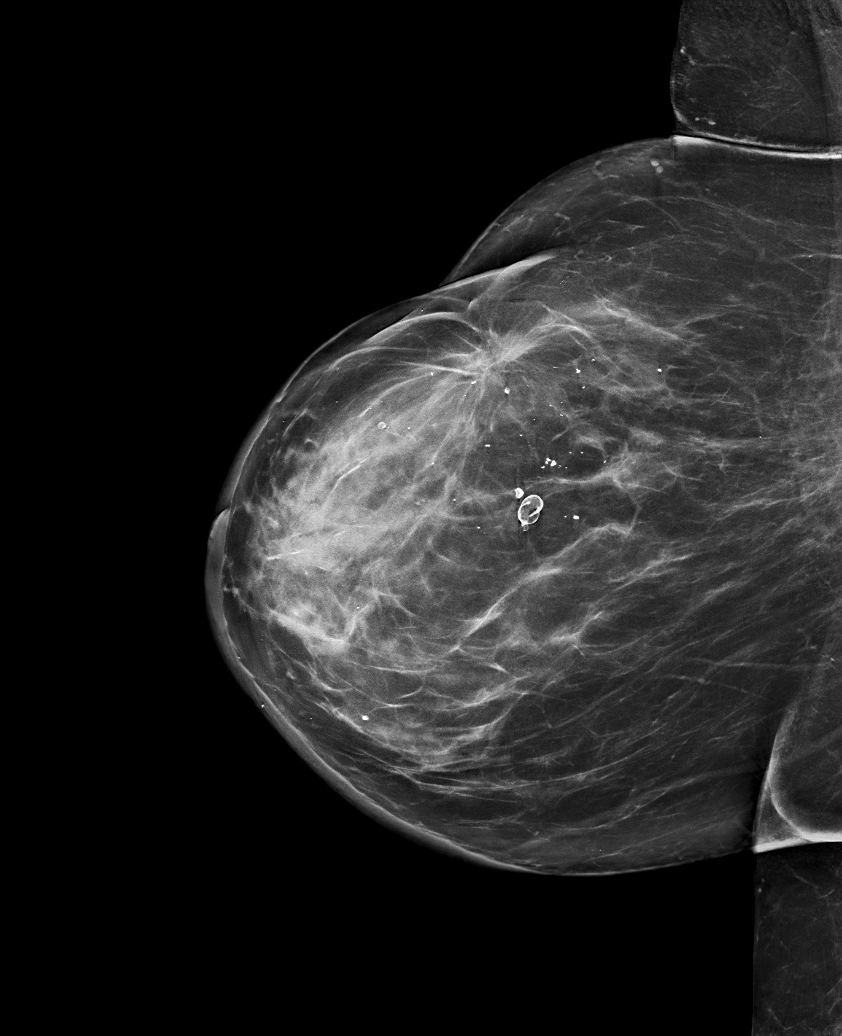

[L MLO synth-2D]
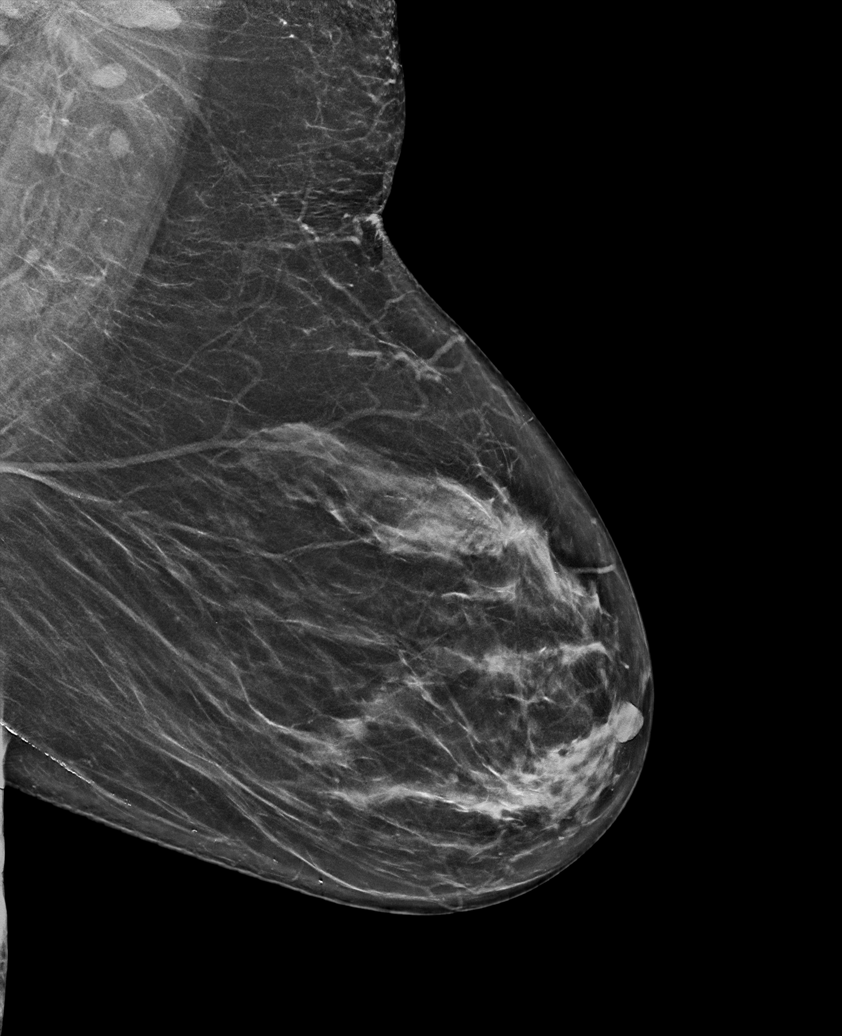

[R MLO tomo · tomo slice 47/93.0]
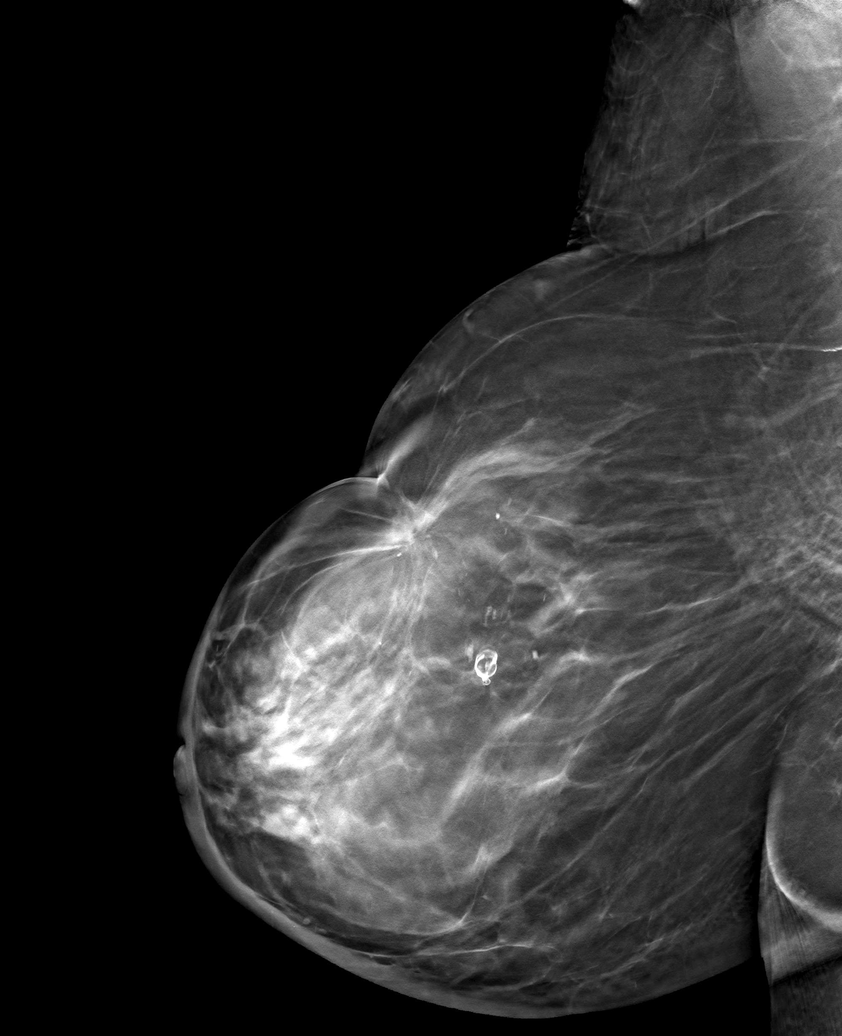

[6 of 30 positions shown; findings below may reference images not displayed]

ACR Breast Density Category c: The breast tissue is heterogeneously
dense, which may obscure small masses.
FINDINGS: There are no findings suspicious for malignancy. Images were
processed with CAD.
IMPRESSION: No mammographic evidence of malignancy. A result letter of this
screening mammogram will be mailed directly to the patient.

RECOMMENDATION:
Screening mammogram in one year. (Code:[5V])

BI-RADS CATEGORY  1: Negative.

## 2019-02-10 DIAGNOSIS — C50011 Malignant neoplasm of nipple and areola, right female breast: Secondary | ICD-10-CM | POA: Diagnosis not present

## 2019-02-18 ENCOUNTER — Ambulatory Visit (INDEPENDENT_AMBULATORY_CARE_PROVIDER_SITE_OTHER): Payer: Medicare Other

## 2019-02-18 ENCOUNTER — Other Ambulatory Visit: Payer: Self-pay

## 2019-02-18 DIAGNOSIS — Z23 Encounter for immunization: Secondary | ICD-10-CM

## 2019-04-06 DIAGNOSIS — I10 Essential (primary) hypertension: Secondary | ICD-10-CM | POA: Diagnosis not present

## 2019-04-06 DIAGNOSIS — R011 Cardiac murmur, unspecified: Secondary | ICD-10-CM | POA: Diagnosis not present

## 2019-04-06 DIAGNOSIS — I350 Nonrheumatic aortic (valve) stenosis: Secondary | ICD-10-CM | POA: Diagnosis not present

## 2019-04-06 DIAGNOSIS — R7303 Prediabetes: Secondary | ICD-10-CM | POA: Diagnosis not present

## 2019-04-06 DIAGNOSIS — E7800 Pure hypercholesterolemia, unspecified: Secondary | ICD-10-CM | POA: Diagnosis not present

## 2019-06-19 DIAGNOSIS — C189 Malignant neoplasm of colon, unspecified: Secondary | ICD-10-CM

## 2019-06-19 HISTORY — PX: PORT-A-CATH REMOVAL: SHX5289

## 2019-06-19 HISTORY — PX: OTHER SURGICAL HISTORY: SHX169

## 2019-06-19 HISTORY — DX: Malignant neoplasm of colon, unspecified: C18.9

## 2019-08-05 ENCOUNTER — Other Ambulatory Visit: Payer: Self-pay

## 2019-08-05 ENCOUNTER — Ambulatory Visit (INDEPENDENT_AMBULATORY_CARE_PROVIDER_SITE_OTHER): Payer: Medicare Other

## 2019-08-05 ENCOUNTER — Ambulatory Visit: Payer: Medicare Other

## 2019-08-05 ENCOUNTER — Encounter: Payer: Medicare Other | Admitting: Family Medicine

## 2019-08-05 ENCOUNTER — Telehealth (INDEPENDENT_AMBULATORY_CARE_PROVIDER_SITE_OTHER): Payer: Medicare Other | Admitting: Family Medicine

## 2019-08-05 VITALS — BP 136/78 | HR 84 | Temp 98.3°F | Ht 63.0 in | Wt 199.6 lb

## 2019-08-05 VITALS — BP 136/78 | HR 84 | Temp 98.3°F | Ht 63.0 in | Wt 199.0 lb

## 2019-08-05 DIAGNOSIS — E0590 Thyrotoxicosis, unspecified without thyrotoxic crisis or storm: Secondary | ICD-10-CM | POA: Diagnosis not present

## 2019-08-05 DIAGNOSIS — C50211 Malignant neoplasm of upper-inner quadrant of right female breast: Secondary | ICD-10-CM

## 2019-08-05 DIAGNOSIS — E7800 Pure hypercholesterolemia, unspecified: Secondary | ICD-10-CM | POA: Diagnosis not present

## 2019-08-05 DIAGNOSIS — I10 Essential (primary) hypertension: Secondary | ICD-10-CM

## 2019-08-05 DIAGNOSIS — Z0000 Encounter for general adult medical examination without abnormal findings: Secondary | ICD-10-CM | POA: Diagnosis not present

## 2019-08-05 DIAGNOSIS — C50911 Malignant neoplasm of unspecified site of right female breast: Secondary | ICD-10-CM

## 2019-08-05 DIAGNOSIS — M810 Age-related osteoporosis without current pathological fracture: Secondary | ICD-10-CM | POA: Diagnosis not present

## 2019-08-05 DIAGNOSIS — R7303 Prediabetes: Secondary | ICD-10-CM | POA: Diagnosis not present

## 2019-08-05 DIAGNOSIS — E039 Hypothyroidism, unspecified: Secondary | ICD-10-CM | POA: Diagnosis not present

## 2019-08-05 MED ORDER — METOPROLOL SUCCINATE ER 50 MG PO TB24: 50.0000 mg | ORAL_TABLET | Freq: Every day | ORAL | 1 refills | Status: DC

## 2019-08-05 MED ORDER — AMLODIPINE BESYLATE 5 MG PO TABS: 5.0000 mg | ORAL_TABLET | Freq: Every day | ORAL | 1 refills | Status: DC

## 2019-08-05 MED ORDER — BENAZEPRIL HCL 40 MG PO TABS: 40.0000 mg | ORAL_TABLET | Freq: Every day | ORAL | 1 refills | Status: DC

## 2019-08-05 NOTE — Patient Instructions (Signed)
Maria Davies , Thank you for taking time to come for your Medicare Wellness Visit. I appreciate your ongoing commitment to your health goals. Please review the following plan we discussed and let me know if I can assist you in the future.   Screening recommendations/referrals: Colonoscopy: no longer required  Mammogram: up to date  Bone Density: up to date  Recommended yearly ophthalmology/optometry visit for glaucoma screening and checkup Recommended yearly dental visit for hygiene and checkup  Vaccinations: Influenza vaccine: up to date  Pneumococcal vaccine: up to date  Tdap vaccine: up to date  Shingles vaccine: shingrix completed  Covid-19: completed series    Advanced directives: Please bring a copy of your health care power of attorney and living will to the office at your convenience.  Conditions/risks identified: none   Next appointment: Follow up in one year for your annual wellness visit.    Preventive Care 77 Years and Older, Female Preventive care refers to lifestyle choices and visits with your health care provider that can promote health and wellness. What does preventive care include?  A yearly physical exam. This is also called an annual well check.  Dental exams once or twice a year.  Routine eye exams. Ask your health care provider how often you should have your eyes checked.  Personal lifestyle choices, including:  Daily care of your teeth and gums.  Regular physical activity.  Eating a healthy diet.  Avoiding tobacco and drug use.  Limiting alcohol use.  Practicing safe sex.  Taking low-dose aspirin every day.  Taking vitamin and mineral supplements as recommended by your health care provider. What happens during an annual well check? The services and screenings done by your health care provider during your annual well check will depend on your age, overall health, lifestyle risk factors, and family history of disease. Counseling  Your  health care provider may ask you questions about your:  Alcohol use.  Tobacco use.  Drug use.  Emotional well-being.  Home and relationship well-being.  Sexual activity.  Eating habits.  History of falls.  Memory and ability to understand (cognition).  Work and work Statistician.  Reproductive health. Screening  You may have the following tests or measurements:  Height, weight, and BMI.  Blood pressure.  Lipid and cholesterol levels. These may be checked every 5 years, or more frequently if you are over 39 years old.  Skin check.  Lung cancer screening. You may have this screening every year starting at age 77 if you have a 30-pack-year history of smoking and currently smoke or have quit within the past 15 years.  Fecal occult blood test (FOBT) of the stool. You may have this test every year starting at age 21.  Flexible sigmoidoscopy or colonoscopy. You may have a sigmoidoscopy every 5 years or a colonoscopy every 10 years starting at age 77.  Hepatitis C blood test.  Hepatitis B blood test.  Sexually transmitted disease (STD) testing.  Diabetes screening. This is done by checking your blood sugar (glucose) after you have not eaten for a while (fasting). You may have this done every 1-3 years.  Bone density scan. This is done to screen for osteoporosis. You may have this done starting at age 77.  Mammogram. This may be done every 1-2 years. Talk to your health care provider about how often you should have regular mammograms. Talk with your health care provider about your test results, treatment options, and if necessary, the need for more tests. Vaccines  Your health care provider may recommend certain vaccines, such as:  Influenza vaccine. This is recommended every year.  Tetanus, diphtheria, and acellular pertussis (Tdap, Td) vaccine. You may need a Td booster every 10 years.  Zoster vaccine. You may need this after age 77.  Pneumococcal 13-valent  conjugate (PCV13) vaccine. One dose is recommended after age 77.  Pneumococcal polysaccharide (PPSV23) vaccine. One dose is recommended after age 77. Talk to your health care provider about which screenings and vaccines you need and how often you need them. This information is not intended to replace advice given to you by your health care provider. Make sure you discuss any questions you have with your health care provider. Document Released: 07/01/2015 Document Revised: 02/22/2016 Document Reviewed: 04/05/2015 Elsevier Interactive Patient Education  2017 [place] Prevention in the Home Falls can cause injuries. They can happen to people of all ages. There are many things you can do to make your home safe and to help prevent falls. What can I do on the outside of my home?  Regularly fix the edges of walkways and driveways and fix any cracks.  Remove anything that might make you trip as you walk through a door, such as a raised step or threshold.  Trim any bushes or trees on the path to your home.  Use bright outdoor lighting.  Clear any walking paths of anything that might make someone trip, such as rocks or tools.  Regularly check to see if handrails are loose or broken. Make sure that both sides of any steps have handrails.  Any raised decks and porches should have guardrails on the edges.  Have any leaves, snow, or ice cleared regularly.  Use sand or salt on walking paths during winter.  Clean up any spills in your garage right away. This includes oil or grease spills. What can I do in the bathroom?  Use night lights.  Install grab bars by the toilet and in the tub and shower. Do not use towel bars as grab bars.  Use non-skid mats or decals in the tub or shower.  If you need to sit down in the shower, use a plastic, non-slip stool.  Keep the floor dry. Clean up any water that spills on the floor as soon as it happens.  Remove soap buildup in the tub or  shower regularly.  Attach bath mats securely with double-sided non-slip rug tape.  Do not have throw rugs and other things on the floor that can make you trip. What can I do in the bedroom?  Use night lights.  Make sure that you have a light by your bed that is easy to reach.  Do not use any sheets or blankets that are too big for your bed. They should not hang down onto the floor.  Have a firm chair that has side arms. You can use this for support while you get dressed.  Do not have throw rugs and other things on the floor that can make you trip. What can I do in the kitchen?  Clean up any spills right away.  Avoid walking on wet floors.  Keep items that you use a lot in easy-to-reach places.  If you need to reach something above you, use a strong step stool that has a grab bar.  Keep electrical cords out of the way.  Do not use floor polish or wax that makes floors slippery. If you must use wax, use non-skid floor wax.  Do  not have throw rugs and other things on the floor that can make you trip. What can I do with my stairs?  Do not leave any items on the stairs.  Make sure that there are handrails on both sides of the stairs and use them. Fix handrails that are broken or loose. Make sure that handrails are as long as the stairways.  Check any carpeting to make sure that it is firmly attached to the stairs. Fix any carpet that is loose or worn.  Avoid having throw rugs at the top or bottom of the stairs. If you do have throw rugs, attach them to the floor with carpet tape.  Make sure that you have a light switch at the top of the stairs and the bottom of the stairs. If you do not have them, ask someone to add them for you. What else can I do to help prevent falls?  Wear shoes that:  Do not have high heels.  Have rubber bottoms.  Are comfortable and fit you well.  Are closed at the toe. Do not wear sandals.  If you use a stepladder:  Make sure that it is fully  opened. Do not climb a closed stepladder.  Make sure that both sides of the stepladder are locked into place.  Ask someone to hold it for you, if possible.  Clearly mark and make sure that you can see:  Any grab bars or handrails.  First and last steps.  Where the edge of each step is.  Use tools that help you move around (mobility aids) if they are needed. These include:  Canes.  Walkers.  Scooters.  Crutches.  Turn on the lights when you go into a dark area. Replace any light bulbs as soon as they burn out.  Set up your furniture so you have a clear path. Avoid moving your furniture around.  If any of your floors are uneven, fix them.  If there are any pets around you, be aware of where they are.  Review your medicines with your doctor. Some medicines can make you feel dizzy. This can increase your chance of falling. Ask your doctor what other things that you can do to help prevent falls. This information is not intended to replace advice given to you by your health care provider. Make sure you discuss any questions you have with your health care provider. Document Released: 03/31/2009 Document Revised: 11/10/2015 Document Reviewed: 07/09/2014 Elsevier Interactive Patient Education  2017 [employer].

## 2019-08-05 NOTE — Progress Notes (Signed)
BP 136/78 (BP Location: Left Arm, Patient Position: Sitting, Cuff Size: Normal)   Pulse 84   Temp 98.3 F (36.8 C)   Ht 5\' 3"  (1.6 m)   Wt 199 lb (90.3 kg)   SpO2 98%   BMI 35.25 kg/m    Subjective:    Patient ID: Maria Davies, female    DOB: August 10, 1940, 79 y.o.   MRN: [id number]  HPI: Maria Davies is a 79 y.o. female  Chief Complaint  Patient presents with  . Hypertension  . Hyperlipidemia  . Hypothyroidism   HYPERTENSION / HYPERLIPIDEMIA Satisfied with current treatment? yes Duration of hypertension: chronic BP monitoring frequency: not checking BP medication side effects: no Past BP meds: amlodipine, metoprolol, benazepril Duration of hyperlipidemia: chronic Cholesterol medication side effects: not on anything Cholesterol supplements: none Past cholesterol medications: none Medication compliance: excellent compliance Aspirin: yes Recent stressors: no Recurrent headaches: no Visual changes: no Palpitations: no Dyspnea: no Chest pain: no Lower extremity edema: no Dizzy/lightheaded: no  Impaired Fasting Glucose HbA1C:  Lab Results  Component Value Date   HGBA1C 5.2 08/05/2019   Duration of elevated blood sugar: chronic Polydipsia: no Polyuria: no Weight change: no Visual disturbance: no Glucose Monitoring: no    Accucheck frequency: Not Checking Diabetic Education: Not Completed Family history of diabetes: yes  HYPOTHYROIDISM- had it checked with Dr. Gabriel Carina not long ago, feels like it's doing well Thyroid control status:controlled Satisfied with current treatment? yes Medication side effects: no Medication compliance: good compliance Recent dose adjustment:no Fatigue: no Cold intolerance: no Heat intolerance: no Weight gain: no Weight loss: no Constipation: no Diarrhea/loose stools: no Palpitations: no Lower extremity edema: no Anxiety/depressed mood: no   Relevant past medical, surgical, family and social history reviewed and updated  as indicated. Interim medical history since our last visit reviewed. Allergies and medications reviewed and updated.  Review of Systems  Constitutional: Negative.   Respiratory: Negative.   Cardiovascular: Negative.   Musculoskeletal: Negative.   Psychiatric/Behavioral: Negative.     Per HPI unless specifically indicated above     Objective:    BP 136/78 (BP Location: Left Arm, Patient Position: Sitting, Cuff Size: Normal)   Pulse 84   Temp 98.3 F (36.8 C)   Ht 5\' 3"  (1.6 m)   Wt 199 lb (90.3 kg)   SpO2 98%   BMI 35.25 kg/m   Wt Readings from Last 3 Encounters:  08/05/19 199 lb (90.3 kg)  08/05/19 199 lb 9.6 oz (90.5 kg)  08/05/19 199 lb 9.6 oz (90.5 kg)    Physical Exam Vitals and nursing note reviewed.  Constitutional:      General: She is not in acute distress.    Appearance: Normal appearance. She is not ill-appearing, toxic-appearing or diaphoretic.  HENT:     Head: Normocephalic and atraumatic.     Right Ear: External ear normal.     Left Ear: External ear normal.     Nose: Nose normal.     Mouth/Throat:     Mouth: Mucous membranes are moist.     Pharynx: Oropharynx is clear.  Eyes:     General: No scleral icterus.       Right eye: No discharge.        Left eye: No discharge.     Conjunctiva/sclera: Conjunctivae normal.     Pupils: Pupils are equal, round, and reactive to light.  Pulmonary:     Effort: Pulmonary effort is normal. No respiratory distress.     Comments: Speaking  in full sentences Musculoskeletal:        General: Normal range of motion.     Cervical back: Normal range of motion.  Skin:    Coloration: Skin is not jaundiced or pale.     Findings: No bruising, erythema, lesion or rash.  Neurological:     Mental Status: She is alert and oriented to person, place, and time. Mental status is at baseline.  Psychiatric:        Mood and Affect: Mood normal.        Behavior: Behavior normal.        Thought Content: Thought content normal.          Judgment: Judgment normal.     Results for orders placed or performed in visit on 08/05/19  Microscopic Examination   BLD  Result Value Ref Range   WBC, UA 11-30 (A) 0 - 5 /hpf   RBC None seen 0 - 2 /hpf   Epithelial Cells (non renal) 0-10 0 - 10 /hpf   Bacteria, UA Few (A) None seen/Few  Urine Culture, Reflex   BLD  Result Value Ref Range   Urine Culture, Routine WILL FOLLOW   Bayer DCA Hb A1c Waived  Result Value Ref Range   HB A1C (BAYER DCA - WAIVED) 5.2 <7.0 %  CBC with Differential/Platelet  Result Value Ref Range   WBC 5.4 3.4 - 10.8 x10E3/uL   RBC 4.03 3.77 - 5.28 x10E6/uL   Hemoglobin 10.2 (L) 11.1 - 15.9 g/dL   Hematocrit 33.2 (L) 34.0 - 46.6 %   MCV 82 79 - 97 fL   MCH 25.3 (L) 26.6 - 33.0 pg   MCHC 30.7 (L) 31.5 - 35.7 g/dL   RDW 15.8 (H) 11.7 - 15.4 %   Platelets 266 150 - 450 x10E3/uL   Neutrophils 52 Not Estab. %   Lymphs 30 Not Estab. %   Monocytes 12 Not Estab. %   Eos 4 Not Estab. %   Basos 1 Not Estab. %   Neutrophils Absolute 2.8 1.4 - 7.0 x10E3/uL   Lymphocytes Absolute 1.6 0.7 - 3.1 x10E3/uL   Monocytes Absolute 0.6 0.1 - 0.9 x10E3/uL   EOS (ABSOLUTE) 0.2 0.0 - 0.4 x10E3/uL   Basophils Absolute 0.1 0.0 - 0.2 x10E3/uL   Immature Granulocytes 1 Not Estab. %   Immature Grans (Abs) 0.0 0.0 - 0.1 x10E3/uL  Comprehensive metabolic panel  Result Value Ref Range   Glucose 98 65 - 99 mg/dL   BUN 11 8 - 27 mg/dL   Creatinine, Ser 0.53 (L) 0.57 - 1.00 mg/dL   GFR calc non Af Amer 93 >59 mL/min/1.73   GFR calc Af Amer 107 >59 mL/min/1.73   BUN/Creatinine Ratio 21 12 - 28   Sodium 140 134 - 144 mmol/L   Potassium 4.2 3.5 - 5.2 mmol/L   Chloride 102 96 - 106 mmol/L   CO2 23 20 - 29 mmol/L   Calcium 9.4 8.7 - 10.3 mg/dL   Total Protein 6.4 6.0 - 8.5 g/dL   Albumin 4.3 3.7 - 4.7 g/dL   Globulin, Total 2.1 1.5 - 4.5 g/dL   Albumin/Globulin Ratio 2.0 1.2 - 2.2   Bilirubin Total 0.2 0.0 - 1.2 mg/dL   Alkaline Phosphatase 70 39 - 117 IU/L   AST 18 0  - 40 IU/L   ALT 13 0 - 32 IU/L  Lipid Panel w/o Chol/HDL Ratio  Result Value Ref Range   Cholesterol, Total 195 100 - 199 mg/dL  Triglycerides 56 0 - 149 mg/dL   HDL 84 >39 mg/dL   VLDL Cholesterol Cal 11 5 - 40 mg/dL   LDL Chol Calc (NIH) 100 (H) 0 - 99 mg/dL  Microalbumin, Urine Waived  Result Value Ref Range   Microalb, Ur Waived 10 0 - 19 mg/L   Creatinine, Urine Waived 10 10 - 300 mg/dL   Microalb/Creat Ratio <30 <30 mg/g  TSH  Result Value Ref Range   TSH 0.059 (L) 0.450 - 4.500 uIU/mL  UA/M w/rflx Culture, Routine   Specimen: Blood   BLD  Result Value Ref Range   Specific Gravity, UA 1.010 1.005 - 1.030   pH, UA 6.5 5.0 - 7.5   Color, UA Yellow Yellow   Appearance Ur Clear Clear   Leukocytes,UA 2+ (A) Negative   Protein,UA Negative Negative/Trace   Glucose, UA Negative Negative   Ketones, UA Negative Negative   RBC, UA Negative Negative   Bilirubin, UA Negative Negative   Urobilinogen, Ur 0.2 0.2 - 1.0 mg/dL   Nitrite, UA Negative Negative   Microscopic Examination See below:    Urinalysis Reflex Comment   VITAMIN D 25 Hydroxy (Vit-D Deficiency, Fractures)  Result Value Ref Range   Vit D, 25-Hydroxy 52.5 30.0 - 100.0 ng/mL      Assessment & Plan:   Problem List Items Addressed This Visit      Cardiovascular and Mediastinum   Hypertension - Primary    Under good control on current regimen. Continue current regimen. Continue to monitor. Call with any concerns. Refills given. Labs drawn today.       Relevant Medications   metoprolol succinate (TOPROL-XL) 50 MG 24 hr tablet   benazepril (LOTENSIN) 40 MG tablet   amLODipine (NORVASC) 5 MG tablet     Endocrine   Hypothyroid    Rechecking levels today. Will treat as needed. Call with any concerns.       Relevant Medications   metoprolol succinate (TOPROL-XL) 50 MG 24 hr tablet     Musculoskeletal and Integument   Osteoporosis     Other   Breast cancer in female [facility])    Continue to follow with  oncology. Call with any concerns. Stable.      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 Call with any concerns. Stable.      Hypercholesteremia    Under good control on current regimen. Continue current regimen. Continue to monitor. Call with any concerns. Refills given. Labs drawn today.       Relevant Medications   metoprolol succinate (TOPROL-XL) 50 MG 24 hr tablet   benazepril (LOTENSIN) 40 MG tablet   amLODipine (NORVASC) 5 MG tablet   Pre-diabetes    Rechecking levels today. Will treat as needed. Await results.           Follow up plan: Return in about 6 months (around 02/02/2020) for follow up.   . This visit was completed via mychart due to the restrictions of the COVID-19 pandemic. All issues as above were discussed and addressed. Physical exam was done as above through visual confirmation on mychart. If it was felt that the patient should be evaluated in the office, they were directed there. The patient verbally consented to this visit. . Location of the patient: home . Location of the provider: home . Those involved with this call:  . Provider: Park Liter, DO . CMA: Tiffany Reel, CMA . Front Desk/Registration: Don Perking  . Time spent  on call: 25 minutes with patient face to face via video conference. More than 50% of this time was spent in counseling and coordination of care. 40 minutes total spent in review of patient's record and preparation of their chart.

## 2019-08-05 NOTE — Progress Notes (Signed)
Subjective:   Maria Davies is a 79 y.o. female who presents for Medicare Annual (Subsequent) preventive examination.    Review of Systems:   Cardiac Risk Factors include: advanced age (>35men, >5 women);dyslipidemia;hypertension     Objective:     Vitals: BP 136/78 (BP Location: Left Arm, Patient Position: Sitting, Cuff Size: Normal)    Pulse 84    Temp 98.3 F (36.8 C) (Oral)    Ht 5\' 3"  (1.6 m)    Wt 199 lb 9.6 oz (90.5 kg)    SpO2 98%    BMI 35.36 kg/m   Body mass index is 35.36 kg/m.  Advanced Directives 08/05/2019 10/18/2015  Does Patient Have a Medical Advance Directive? Yes Yes  Type of Advance Directive Living will;Healthcare Power of [place];Living will  Does patient want to make changes to medical advance directive? - No - Patient declined  Copy of [place] in Chart? No - copy requested -    Tobacco Social History   Tobacco Use  Smoking Status Never Smoker  Smokeless Tobacco Never Used     Counseling given: Not Answered   Clinical Intake:  Pre-visit preparation completed: Yes  Pain : No/denies pain     Nutritional Risks: None Diabetes: No  How often do you need to have someone help you when you read instructions, pamphlets, or other written materials from your doctor or pharmacy?: 1 - Never  Interpreter Needed?: No  Information entered by :: Bertie Simien,LPN  Past Medical History:  Diagnosis Date   Basal cell carcinoma    back   Breast cancer ([place]) 2010   RT LUMPECTOMY   Cancer ([place])    rt breast   Osteopenia    Personal history of chemotherapy 2010   right breast ca   Personal history of radiation therapy 2010   right breast ca   Past Surgical History:  Procedure Laterality Date   BACK SURGERY     BREAST EXCISIONAL BIOPSY Right 03/2009   radiation and chemo +   BREAST EXCISIONAL BIOPSY Bilateral    NEG   BREAST SURGERY     CHOLECYSTECTOMY     Family History  Problem  Relation Age of Onset   Cancer Mother    Heart disease Father    Breast cancer Paternal Aunt 80   Social History   Socioeconomic History   Marital status: Widowed    Spouse name: Not on file   Number of children: Not on file   Years of education: Not on file   Highest education level: Not on file  Occupational History   Occupation: retired   Tobacco Use   Smoking status: Never Smoker   Smokeless tobacco: Never Used  Substance and Sexual Activity   Alcohol use: Yes    Alcohol/week: 4.0 standard drinks    Types: 4 Shots of liquor per week    Comment: mixed drink    Drug use: No   Sexual activity: Not on file  Other Topics Concern   Not on file  Social History Narrative   Not on file   Social Determinants of Health   Financial Resource Strain:    Difficulty of Paying Living Expenses: Not on file  Food Insecurity:    Worried About [place] in the Last Year: Not on file   Ran Out of Food in the Last Year: Not on file  Transportation Needs:    Lack of Transportation (Medical): Not on file  Lack of Transportation (Non-Medical): Not on file  Physical Activity:    Days of Exercise per Week: Not on file   Minutes of Exercise per Session: Not on file  Stress:    Feeling of Stress : Not on file  Social Connections:    Frequency of Communication with Friends and Family: Not on file   Frequency of Social Gatherings with Friends and Family: Not on file   Attends Religious Services: Not on file   Active Member of Clubs or Organizations: Not on file   Attends Archivist Meetings: Not on file   Marital Status: Not on file    Outpatient Encounter Medications as of 08/05/2019  Medication Sig   amLODipine (NORVASC) 5 MG tablet Take 1 tablet (5 mg total) by mouth daily.   aspirin EC 81 MG tablet Take 81 mg by mouth daily.   benazepril (LOTENSIN) 40 MG tablet Take 1 tablet (40 mg total) by mouth daily.   Cholecalciferol  (VITAMIN D3) 50 MCG (2000 UT) capsule Take 2,000 Units by mouth daily.   levothyroxine (SYNTHROID, LEVOTHROID) 25 MCG tablet Take by mouth.   metoprolol succinate (TOPROL-XL) 50 MG 24 hr tablet Take 1 tablet (50 mg total) by mouth daily.   Multiple Vitamin (MULTI-VITAMINS) TABS Take by mouth.   OVER THE COUNTER MEDICATION Omega 3, resveratrol, icariin, curcumin   Probiotic Product (PROBIOTIC ADVANCED PO) Take by mouth daily.   silver sulfADIAZINE (SILVADENE) 1 % cream Apply 1 application topically daily.   [DISCONTINUED] Omega-3 Fatty Acids (FISH OIL) 1000 MG CAPS Take by mouth.   No facility-administered encounter medications on file as of 08/05/2019.    Activities of Daily Living In your present state of health, do you have any difficulty performing the following activities: 08/05/2019  Hearing? N  Comment no hearing aids  Vision? N  Comment Dr.Cheek annually, no glasses  Difficulty concentrating or making decisions? N  Walking or climbing stairs? Y  Comment knee pain  Dressing or bathing? N  Doing errands, shopping? N  Preparing Food and eating ? N  Using the Toilet? N  In the past six months, have you accidently leaked urine? N  Do you have problems with loss of bowel control? N  Managing your Medications? N  Managing your Finances? N  Housekeeping or managing your Housekeeping? N  Comment cleaners for deep cleaning  Some recent data might be hidden    Patient Care Team: Guadalupe Maple, MD as PCP - General (Family Medicine) Guadalupe Maple, MD as PCP - Family Medicine (Family Medicine)    Assessment:   This is a routine wellness examination for [name].  Exercise Activities and Dietary recommendations Current Exercise Habits: The patient does not participate in regular exercise at present, Exercise limited by: None identified  Goals   None     Fall Risk: Fall Risk  08/05/2019 07/28/2018 01/20/2018 07/03/2016 06/10/2015  Falls in the past year? 0 0 No No No    Comment - - Emmi Telephone Survey: data to providers prior to load - -  Number falls in past yr: 0 - - - -  Injury with Fall? 0 - - - -  Follow up - Falls evaluation completed - - -    FALL RISK PREVENTION PERTAINING TO THE HOME:  Any stairs in or around the home? No  If so, are there any without handrails? No   Home free of loose throw rugs in walkways, pet beds, electrical cords, etc? Yes  Adequate lighting in your home to reduce risk of falls? Yes   ASSISTIVE DEVICES UTILIZED TO PREVENT FALLS:  Life alert? No  Use of a cane, walker or w/c? No  Grab bars in the bathroom? No  Shower chair or bench in shower? No  Elevated toilet seat or a handicapped toilet? No   DME ORDERS:  DME order needed?  No   TIMED UP AND GO:  Was the test performed? Yes .  Length of time to ambulate 10 feet: 8 sec.   GAIT:  Appearance of gait: Gait steady and fast without the use of an assistive device.  Education: Fall risk prevention has been discussed.  Intervention(s) required? No   DME/home health order needed?  No    Depression Screen PHQ 2/9 Scores 08/05/2019 07/28/2018 01/23/2018 07/03/2016  PHQ - 2 Score 0 0 0 0  PHQ- 9 Score - 2 0 -     Cognitive Function        Immunization History  Administered Date(s) Administered   Fluad Quad(high Dose 65+) 02/18/2019   Influenza, High Dose Seasonal PF 04/23/2016, 03/25/2017   Influenza,inj,Quad PF,6+ Mos 04/11/2015   Influenza-Unspecified 04/01/2018   PFIZER SARS-COV-2 Vaccination 06/25/2019, 07/16/2019   Pneumococcal Conjugate-13 04/26/2014   Pneumococcal Polysaccharide-23 07/03/2016   Tdap 02/10/2009   Zoster 10/24/2006   Zoster Recombinat (Shingrix) 02/18/2018, 05/09/2018    Qualifies for Shingles Vaccine? Yes  shingrix completed   Tdap: up to date   Flu Vaccine: up to date   Pneumococcal Vaccine: up to date   Covid-19: completed series   Screening Tests Health Maintenance  Topic Date Due   TETANUS/TDAP   08/04/2020 (Originally 02/11/2019)   INFLUENZA VACCINE  Completed   DEXA SCAN  Completed   PNA vac Low Risk Adult  Completed    Cancer Screenings:  Colorectal Screening: no longer required   Mammogram: Completed 02/02/2019. Repeat every year  Bone Density: up to date   Lung Cancer Screening: (Low Dose CT Chest recommended if Age 58-80 years, 30 pack-year currently smoking OR have quit w/in 15years.) does not qualify.      Additional Screening:  Hepatitis C Screening: does not qualify  Vision Screening: Recommended annual ophthalmology exams for early detection of glaucoma and other disorders of the eye. Is the patient up to date with their annual eye exam?  Yes  Who is the provider or what is the name of the office in which the pt attends annual eye exams? Dr.Cheek   Dental Screening: Recommended annual dental exams for proper oral hygiene  Community Resource Referral:  CRR required this visit?  No       Plan:  I have personally reviewed and addressed the Medicare Annual Wellness questionnaire and have noted the following in the patients chart:  A. Medical and social history B. Use of alcohol, tobacco or illicit drugs  C. Current medications and supplements D. Functional ability and status E.  Nutritional status F.  Physical activity G. Advance directives H. List of other physicians I.  Hospitalizations, surgeries, and ER visits in previous 12 months J.  [place] such as hearing and vision if needed, cognitive and depression L. Referrals and appointments   In addition, I have reviewed and discussed with patient certain preventive protocols, quality metrics, and best practice recommendations. A written personalized care plan for preventive services as well as general preventive health recommendations were provided to patient.  Signed,    Bevelyn Ngo, LPN  [id number] Nurse Health  Advisor   Nurse Notes: none

## 2019-08-06 ENCOUNTER — Encounter: Payer: Self-pay | Admitting: Family Medicine

## 2019-08-06 LAB — CBC WITH DIFFERENTIAL/PLATELET
Basophils Absolute: 0.1 10*3/uL (ref 0.0–0.2)
Basos: 1 %
EOS (ABSOLUTE): 0.2 10*3/uL (ref 0.0–0.4)
Eos: 4 %
Hematocrit: 33.2 % — ABNORMAL LOW (ref 34.0–46.6)
Hemoglobin: 10.2 g/dL — ABNORMAL LOW (ref 11.1–15.9)
Immature Grans (Abs): 0 10*3/uL (ref 0.0–0.1)
Immature Granulocytes: 1 %
Lymphocytes Absolute: 1.6 10*3/uL (ref 0.7–3.1)
Lymphs: 30 %
MCH: 25.3 pg — ABNORMAL LOW (ref 26.6–33.0)
MCHC: 30.7 g/dL — ABNORMAL LOW (ref 31.5–35.7)
MCV: 82 fL (ref 79–97)
Monocytes Absolute: 0.6 10*3/uL (ref 0.1–0.9)
Monocytes: 12 %
Neutrophils Absolute: 2.8 10*3/uL (ref 1.4–7.0)
Neutrophils: 52 %
Platelets: 266 10*3/uL (ref 150–450)
RBC: 4.03 x10E6/uL (ref 3.77–5.28)
RDW: 15.8 % — ABNORMAL HIGH (ref 11.7–15.4)
WBC: 5.4 10*3/uL (ref 3.4–10.8)

## 2019-08-06 LAB — COMPREHENSIVE METABOLIC PANEL
ALT: 13 IU/L (ref 0–32)
AST: 18 IU/L (ref 0–40)
Albumin/Globulin Ratio: 2 (ref 1.2–2.2)
Albumin: 4.3 g/dL (ref 3.7–4.7)
Alkaline Phosphatase: 70 IU/L (ref 39–117)
BUN/Creatinine Ratio: 21 (ref 12–28)
BUN: 11 mg/dL (ref 8–27)
Bilirubin Total: 0.2 mg/dL (ref 0.0–1.2)
CO2: 23 mmol/L (ref 20–29)
Calcium: 9.4 mg/dL (ref 8.7–10.3)
Chloride: 102 mmol/L (ref 96–106)
Creatinine, Ser: 0.53 mg/dL — ABNORMAL LOW (ref 0.57–1.00)
GFR calc Af Amer: 107 mL/min/{1.73_m2} (ref >59)
GFR calc non Af Amer: 93 mL/min/{1.73_m2} (ref >59)
Globulin, Total: 2.1 g/dL (ref 1.5–4.5)
Glucose: 98 mg/dL (ref 65–99)
Potassium: 4.2 mmol/L (ref 3.5–5.2)
Sodium: 140 mmol/L (ref 134–144)
Total Protein: 6.4 g/dL (ref 6.0–8.5)

## 2019-08-06 LAB — LIPID PANEL W/O CHOL/HDL RATIO
Cholesterol, Total: 195 mg/dL (ref 100–199)
HDL: 84 mg/dL (ref >39)
LDL Chol Calc (NIH): 100 mg/dL — ABNORMAL HIGH (ref 0–99)
Triglycerides: 56 mg/dL (ref 0–149)
VLDL Cholesterol Cal: 11 mg/dL (ref 5–40)

## 2019-08-06 LAB — VITAMIN D 25 HYDROXY (VIT D DEFICIENCY, FRACTURES): Vit D, 25-Hydroxy: 52.5 ng/mL (ref 30.0–100.0)

## 2019-08-06 LAB — TSH: TSH: 0.059 u[IU]/mL — ABNORMAL LOW (ref 0.450–4.500)

## 2019-08-06 NOTE — Assessment & Plan Note (Signed)
Continue to follow with oncology. Call with any concerns. Stable.  

## 2019-08-06 NOTE — Assessment & Plan Note (Signed)
Under good control on current regimen. Continue current regimen. Continue to monitor. Call with any concerns. Refills given. Labs drawn today.   

## 2019-08-06 NOTE — Assessment & Plan Note (Signed)
Rechecking levels today. Will treat as needed. Await results.

## 2019-08-06 NOTE — Assessment & Plan Note (Signed)
Rechecking levels today. Will treat as needed. Call with any concerns.  

## 2019-08-07 ENCOUNTER — Encounter: Payer: Self-pay | Admitting: Family Medicine

## 2019-08-07 ENCOUNTER — Other Ambulatory Visit: Payer: Self-pay | Admitting: Family Medicine

## 2019-08-07 DIAGNOSIS — D649 Anemia, unspecified: Secondary | ICD-10-CM

## 2019-08-07 LAB — UA/M W/RFLX CULTURE, ROUTINE
Bilirubin, UA: NEGATIVE
Glucose, UA: NEGATIVE
Ketones, UA: NEGATIVE
Nitrite, UA: NEGATIVE
Protein,UA: NEGATIVE
RBC, UA: NEGATIVE
Specific Gravity, UA: 1.01 (ref 1.005–1.030)
Urobilinogen, Ur: 0.2 mg/dL (ref 0.2–1.0)
pH, UA: 6.5 (ref 5.0–7.5)

## 2019-08-07 LAB — MICROSCOPIC EXAMINATION: RBC, Urine: NONE SEEN /hpf (ref 0–2)

## 2019-08-07 LAB — BAYER DCA HB A1C WAIVED: HB A1C (BAYER DCA - WAIVED): 5.2 % (ref <7.0)

## 2019-08-07 LAB — URINE CULTURE, REFLEX

## 2019-08-07 LAB — MICROALBUMIN, URINE WAIVED
Creatinine, Urine Waived: 10 mg/dL (ref 10–300)
Microalb, Ur Waived: 10 mg/L (ref 0–19)
Microalb/Creat Ratio: <30 mg/g (ref <30)

## 2019-08-10 ENCOUNTER — Other Ambulatory Visit: Payer: Self-pay

## 2019-08-10 ENCOUNTER — Other Ambulatory Visit: Payer: Medicare Other

## 2019-08-10 DIAGNOSIS — D649 Anemia, unspecified: Secondary | ICD-10-CM

## 2019-08-11 ENCOUNTER — Other Ambulatory Visit: Payer: Medicare Other

## 2019-08-11 DIAGNOSIS — D649 Anemia, unspecified: Secondary | ICD-10-CM | POA: Diagnosis not present

## 2019-08-11 LAB — CBC WITH DIFFERENTIAL/PLATELET
Basophils Absolute: 0.1 10*3/uL (ref 0.0–0.2)
Basos: 1 %
EOS (ABSOLUTE): 0.3 10*3/uL (ref 0.0–0.4)
Eos: 5 %
Hematocrit: 33.5 % — ABNORMAL LOW (ref 34.0–46.6)
Hemoglobin: 10.3 g/dL — ABNORMAL LOW (ref 11.1–15.9)
Immature Grans (Abs): 0 10*3/uL (ref 0.0–0.1)
Immature Granulocytes: 1 %
Lymphocytes Absolute: 1.7 10*3/uL (ref 0.7–3.1)
Lymphs: 28 %
MCH: 24.6 pg — ABNORMAL LOW (ref 26.6–33.0)
MCHC: 30.7 g/dL — ABNORMAL LOW (ref 31.5–35.7)
MCV: 80 fL (ref 79–97)
Monocytes Absolute: 0.6 10*3/uL (ref 0.1–0.9)
Monocytes: 11 %
Neutrophils Absolute: 3.3 10*3/uL (ref 1.4–7.0)
Neutrophils: 54 %
Platelets: 307 10*3/uL (ref 150–450)
RBC: 4.18 x10E6/uL (ref 3.77–5.28)
RDW: 15.6 % — ABNORMAL HIGH (ref 11.7–15.4)
WBC: 6.1 10*3/uL (ref 3.4–10.8)

## 2019-08-11 LAB — IRON AND TIBC
Iron Saturation: 9 % — CL (ref 15–55)
Iron: 41 ug/dL (ref 27–139)
Total Iron Binding Capacity: 445 ug/dL (ref 250–450)
UIBC: 404 ug/dL — ABNORMAL HIGH (ref 118–369)

## 2019-08-11 LAB — FERRITIN: Ferritin: 11 ng/mL — ABNORMAL LOW (ref 15–150)

## 2019-08-11 NOTE — Progress Notes (Signed)
This encounter was created in error - please disregard.

## 2019-08-13 LAB — FECAL OCCULT BLOOD, IMMUNOCHEMICAL: Fecal Occult Bld: POSITIVE — AB

## 2019-08-14 ENCOUNTER — Other Ambulatory Visit: Payer: Self-pay | Admitting: Family Medicine

## 2019-08-14 DIAGNOSIS — R195 Other fecal abnormalities: Secondary | ICD-10-CM

## 2019-08-14 DIAGNOSIS — D500 Iron deficiency anemia secondary to blood loss (chronic): Secondary | ICD-10-CM

## 2019-08-14 MED ORDER — FERROUS SULFATE 325 (65 FE) MG PO TBEC: 325.0000 mg | DELAYED_RELEASE_TABLET | Freq: Three times a day (TID) | ORAL | 3 refills | Status: DC

## 2019-09-22 DIAGNOSIS — E039 Hypothyroidism, unspecified: Secondary | ICD-10-CM | POA: Diagnosis not present

## 2019-09-29 DIAGNOSIS — E039 Hypothyroidism, unspecified: Secondary | ICD-10-CM | POA: Diagnosis not present

## 2019-09-30 ENCOUNTER — Other Ambulatory Visit: Payer: Self-pay

## 2019-09-30 ENCOUNTER — Ambulatory Visit (INDEPENDENT_AMBULATORY_CARE_PROVIDER_SITE_OTHER): Payer: Medicare Other | Admitting: Gastroenterology

## 2019-09-30 ENCOUNTER — Encounter: Payer: Self-pay | Admitting: Gastroenterology

## 2019-09-30 ENCOUNTER — Encounter (INDEPENDENT_AMBULATORY_CARE_PROVIDER_SITE_OTHER): Payer: Self-pay

## 2019-09-30 VITALS — BP 133/77 | HR 105 | Temp 98.2°F | Ht 63.0 in | Wt 184.2 lb

## 2019-09-30 DIAGNOSIS — D500 Iron deficiency anemia secondary to blood loss (chronic): Secondary | ICD-10-CM

## 2019-09-30 MED ORDER — PEG 3350-KCL-NA BICARB-NACL 420 G PO SOLR: ORAL | 0 refills | Status: DC

## 2019-09-30 NOTE — Progress Notes (Signed)
Gastroenterology Consultation  Referring Provider:     Valerie Roys, DO Primary Care Physician:  Valerie Roys, DO Primary Gastroenterologist:  Dr. Allen Norris     Reason for Consultation:     Iron deficiency anemia        HPI:   Maria Davies is a 79 y.o. y/o female referred for consultation & management of iron deficiency anemia by Dr. Wynetta Emery, Megan P, DO.  This patient was sent for referral for iron deficiency anemia.  The patient was found to have heme positive stools with iron saturation of 9 and a ferritin low at 11.  The patient had a normal CBC in February of last year and her most recent lab work showed:  Component     Latest Ref Rng & Units 07/28/2018 08/05/2019 08/05/2019 08/10/2019         10:00 AM 10:02 AM   Hemoglobin     11.1 - 15.9 g/dL 13.5  10.2 (L) 10.3 (L)  HCT     34.0 - 46.6 % 39.1  33.2 (L) 33.5 (L)   The patient reports that her last colonoscopy was when she was 79 years old.  She has not seen any rectal bleeding.  She also denies any black stools.  There has not been any unexplained weight loss fevers chills nausea or vomiting.  Past Medical History:  Diagnosis Date  . Basal cell carcinoma    back  . Breast cancer ([place]) 2010   RT LUMPECTOMY  . Cancer ([facility])    rt breast  . Osteopenia   . Personal history of chemotherapy 2010   right breast ca  . Personal history of radiation therapy 2010   right breast ca    Past Surgical History:  Procedure Laterality Date  . BACK SURGERY    . BREAST EXCISIONAL BIOPSY Right 03/2009   radiation and chemo +  . BREAST EXCISIONAL BIOPSY Bilateral    NEG  . BREAST SURGERY    . CHOLECYSTECTOMY      Prior to Admission medications   Medication Sig Start Date End Date Taking? Authorizing Provider  amLODipine (NORVASC) 5 MG tablet Take 1 tablet (5 mg total) by mouth daily. 08/05/19   Park Liter P, DO  aspirin EC 81 MG tablet Take 81 mg by mouth daily.    [provider]  benazepril (LOTENSIN) 40 MG  tablet Take 1 tablet (40 mg total) by mouth daily. 08/05/19   Park Liter P, DO  Cholecalciferol (VITAMIN D3) 50 MCG (2000 UT) capsule Take 2,000 Units by mouth daily.    [provider]  ferrous sulfate 325 (65 FE) MG EC tablet Take 1 tablet (325 mg total) by mouth 3 (three) times daily with meals. 08/14/19   Valerie Roys, DO  levothyroxine (SYNTHROID, LEVOTHROID) 25 MCG tablet Take by mouth. 11/13/17 08/05/19  [provider]  metoprolol succinate (TOPROL-XL) 50 MG 24 hr tablet Take 1 tablet (50 mg total) by mouth daily. 08/05/19   Park Liter P, DO  Multiple Vitamin (MULTI-VITAMINS) TABS Take by mouth.    [provider]  OVER THE COUNTER MEDICATION Omega 3, resveratrol, icariin, curcumin    [provider]  Probiotic Product (PROBIOTIC ADVANCED PO) Take by mouth daily.    [provider]  silver sulfADIAZINE (SILVADENE) 1 % cream Apply 1 application topically daily. 07/22/17   Guadalupe Maple, MD    Family History  Problem Relation Age of Onset  . Cancer Mother   .  Heart disease Father   . Breast cancer Paternal Aunt 6     Social History   Tobacco Use  . Smoking status: Never Smoker  . Smokeless tobacco: Never Used  Substance Use Topics  . Alcohol use: Yes    Alcohol/week: 4.0 standard drinks    Types: 4 Shots of liquor per week    Comment: mixed drink   . Drug use: No    Allergies as of 09/30/2019  . (No Known Allergies)    Review of Systems:    All systems reviewed and negative except where noted in HPI.   Physical Exam:  There were no vitals taken for this visit. No LMP recorded. Patient is postmenopausal. General:   Alert,  Well-developed, well-nourished, pleasant and cooperative in NAD Head:  Normocephalic and atraumatic. Eyes:  Sclera clear, no icterus.   Conjunctiva pink. Ears:  Normal auditory acuity. Neck:  Supple; no masses or thyromegaly. Lungs:  Respirations even and unlabored.  Clear throughout to  auscultation.   No wheezes, crackles, or rhonchi. No acute distress. Heart:  Regular rate and rhythm; positive murmurs, clicks, rubs, or gallops. Abdomen:  Normal bowel sounds.  No bruits.  Soft, non-tender and non-distended without masses, hepatosplenomegaly or hernias noted.  No guarding or rebound tenderness.  Negative Carnett sign.   Rectal:  Deferred.  Pulses:  Normal pulses noted. Extremities:  No clubbing or edema.  No cyanosis. Neurologic:  Alert and oriented x3;  grossly normal neurologically. Skin:  Intact without significant lesions or rashes.  No jaundice. Lymph Nodes:  No significant cervical adenopathy. Psych:  Alert and cooperative. Normal mood and affect.  Imaging Studies: No results found.  Assessment and Plan:   Maria Davies is a 79 y.o. y/o female who comes in today with a finding of iron deficiency anemia.  The patient's last colonoscopy was when she was 42.  The patient will be set up for an EGD and colonoscopy to look for source of her anemia. I have discussed risks & benefits which include, but are not limited to, bleeding, infection, perforation & drug reaction.  The patient agrees with this plan & written consent will be obtained.       Lucilla Lame, MD. [facility] Regal    Note: This dictation was prepared with Dragon dictation along with smaller phrase technology. Any transcriptional errors that result from this process are unintentional.

## 2019-09-30 NOTE — H&P (View-Only) (Signed)
Gastroenterology Consultation  Referring Provider:     Valerie Roys, DO Primary Care Physician:  Valerie Roys, DO Primary Gastroenterologist:  Dr. Allen Norris     Reason for Consultation:     Iron deficiency anemia        HPI:   Maria Davies is a 79 y.o. y/o female referred for consultation & management of iron deficiency anemia by Dr. Wynetta Emery, Megan P, DO.  This patient was sent for referral for iron deficiency anemia.  The patient was found to have heme positive stools with iron saturation of 9 and a ferritin low at 11.  The patient had a normal CBC in February of last year and her most recent lab work showed:  Component     Latest Ref Rng & Units 07/28/2018 08/05/2019 08/05/2019 08/10/2019         10:00 AM 10:02 AM   Hemoglobin     11.1 - 15.9 g/dL 13.5  10.2 (L) 10.3 (L)  HCT     34.0 - 46.6 % 39.1  33.2 (L) 33.5 (L)   The patient reports that her last colonoscopy was when she was 79 years old.  She has not seen any rectal bleeding.  She also denies any black stools.  There has not been any unexplained weight loss fevers chills nausea or vomiting.  Past Medical History:  Diagnosis Date  . Basal cell carcinoma    back  . Breast cancer ([place]) 2010   RT LUMPECTOMY  . Cancer ([facility])    rt breast  . Osteopenia   . Personal history of chemotherapy 2010   right breast ca  . Personal history of radiation therapy 2010   right breast ca    Past Surgical History:  Procedure Laterality Date  . BACK SURGERY    . BREAST EXCISIONAL BIOPSY Right 03/2009   radiation and chemo +  . BREAST EXCISIONAL BIOPSY Bilateral    NEG  . BREAST SURGERY    . CHOLECYSTECTOMY      Prior to Admission medications   Medication Sig Start Date End Date Taking? Authorizing Provider  amLODipine (NORVASC) 5 MG tablet Take 1 tablet (5 mg total) by mouth daily. 08/05/19   Park Liter P, DO  aspirin EC 81 MG tablet Take 81 mg by mouth daily.    [provider]  benazepril (LOTENSIN) 40 MG  tablet Take 1 tablet (40 mg total) by mouth daily. 08/05/19   Park Liter P, DO  Cholecalciferol (VITAMIN D3) 50 MCG (2000 UT) capsule Take 2,000 Units by mouth daily.    [provider]  ferrous sulfate 325 (65 FE) MG EC tablet Take 1 tablet (325 mg total) by mouth 3 (three) times daily with meals. 08/14/19   Valerie Roys, DO  levothyroxine (SYNTHROID, LEVOTHROID) 25 MCG tablet Take by mouth. 11/13/17 08/05/19  [provider]  metoprolol succinate (TOPROL-XL) 50 MG 24 hr tablet Take 1 tablet (50 mg total) by mouth daily. 08/05/19   Park Liter P, DO  Multiple Vitamin (MULTI-VITAMINS) TABS Take by mouth.    [provider]  OVER THE COUNTER MEDICATION Omega 3, resveratrol, icariin, curcumin    [provider]  Probiotic Product (PROBIOTIC ADVANCED PO) Take by mouth daily.    [provider]  silver sulfADIAZINE (SILVADENE) 1 % cream Apply 1 application topically daily. 07/22/17   Guadalupe Maple, MD    Family History  Problem Relation Age of Onset  . Cancer Mother   .  Heart disease Father   . Breast cancer Paternal Aunt 56     Social History   Tobacco Use  . Smoking status: Never Smoker  . Smokeless tobacco: Never Used  Substance Use Topics  . Alcohol use: Yes    Alcohol/week: 4.0 standard drinks    Types: 4 Shots of liquor per week    Comment: mixed drink   . Drug use: No    Allergies as of 09/30/2019  . (No Known Allergies)    Review of Systems:    All systems reviewed and negative except where noted in HPI.   Physical Exam:  There were no vitals taken for this visit. No LMP recorded. Patient is postmenopausal. General:   Alert,  Well-developed, well-nourished, pleasant and cooperative in NAD Head:  Normocephalic and atraumatic. Eyes:  Sclera clear, no icterus.   Conjunctiva pink. Ears:  Normal auditory acuity. Neck:  Supple; no masses or thyromegaly. Lungs:  Respirations even and unlabored.  Clear throughout to  auscultation.   No wheezes, crackles, or rhonchi. No acute distress. Heart:  Regular rate and rhythm; positive murmurs, clicks, rubs, or gallops. Abdomen:  Normal bowel sounds.  No bruits.  Soft, non-tender and non-distended without masses, hepatosplenomegaly or hernias noted.  No guarding or rebound tenderness.  Negative Carnett sign.   Rectal:  Deferred.  Pulses:  Normal pulses noted. Extremities:  No clubbing or edema.  No cyanosis. Neurologic:  Alert and oriented x3;  grossly normal neurologically. Skin:  Intact without significant lesions or rashes.  No jaundice. Lymph Nodes:  No significant cervical adenopathy. Psych:  Alert and cooperative. Normal mood and affect.  Imaging Studies: No results found.  Assessment and Plan:   Maria Davies is a 79 y.o. y/o female who comes in today with a finding of iron deficiency anemia.  The patient's last colonoscopy was when she was 54.  The patient will be set up for an EGD and colonoscopy to look for source of her anemia. I have discussed risks & benefits which include, but are not limited to, bleeding, infection, perforation & drug reaction.  The patient agrees with this plan & written consent will be obtained.       Lucilla Lame, MD. [facility]al    Note: This dictation was prepared with Dragon dictation along with smaller phrase technology. Any transcriptional errors that result from this process are unintentional.

## 2019-09-30 NOTE — H&P (View-Only) (Signed)
Gastroenterology Consultation  Referring Provider:     Valerie Roys, DO Primary Care Physician:  Valerie Roys, DO Primary Gastroenterologist:  Dr. Allen Norris     Reason for Consultation:     Iron deficiency anemia        HPI:   Maria Davies is a 79 y.o. y/o female referred for consultation & management of iron deficiency anemia by Dr. Wynetta Emery, Megan P, DO.  This patient was sent for referral for iron deficiency anemia.  The patient was found to have heme positive stools with iron saturation of 9 and a ferritin low at 11.  The patient had a normal CBC in February of last year and her most recent lab work showed:  Component     Latest Ref Rng & Units 07/28/2018 08/05/2019 08/05/2019 08/10/2019         10:00 AM 10:02 AM   Hemoglobin     11.1 - 15.9 g/dL 13.5  10.2 (L) 10.3 (L)  HCT     34.0 - 46.6 % 39.1  33.2 (L) 33.5 (L)   The patient reports that her last colonoscopy was when she was 79 years old.  She has not seen any rectal bleeding.  She also denies any black stools.  There has not been any unexplained weight loss fevers chills nausea or vomiting.  Past Medical History:  Diagnosis Date  . Basal cell carcinoma    back  . Breast cancer ([place]) 2010   RT LUMPECTOMY  . Cancer ([facility])    rt breast  . Osteopenia   . Personal history of chemotherapy 2010   right breast ca  . Personal history of radiation therapy 2010   right breast ca    Past Surgical History:  Procedure Laterality Date  . BACK SURGERY    . BREAST EXCISIONAL BIOPSY Right 03/2009   radiation and chemo +  . BREAST EXCISIONAL BIOPSY Bilateral    NEG  . BREAST SURGERY    . CHOLECYSTECTOMY      Prior to Admission medications   Medication Sig Start Date End Date Taking? Authorizing Provider  amLODipine (NORVASC) 5 MG tablet Take 1 tablet (5 mg total) by mouth daily. 08/05/19   Park Liter P, DO  aspirin EC 81 MG tablet Take 81 mg by mouth daily.    [provider]  benazepril (LOTENSIN) 40 MG  tablet Take 1 tablet (40 mg total) by mouth daily. 08/05/19   Park Liter P, DO  Cholecalciferol (VITAMIN D3) 50 MCG (2000 UT) capsule Take 2,000 Units by mouth daily.    [provider]  ferrous sulfate 325 (65 FE) MG EC tablet Take 1 tablet (325 mg total) by mouth 3 (three) times daily with meals. 08/14/19   Valerie Roys, DO  levothyroxine (SYNTHROID, LEVOTHROID) 25 MCG tablet Take by mouth. 11/13/17 08/05/19  [provider]  metoprolol succinate (TOPROL-XL) 50 MG 24 hr tablet Take 1 tablet (50 mg total) by mouth daily. 08/05/19   Park Liter P, DO  Multiple Vitamin (MULTI-VITAMINS) TABS Take by mouth.    [provider]  OVER THE COUNTER MEDICATION Omega 3, resveratrol, icariin, curcumin    [provider]  Probiotic Product (PROBIOTIC ADVANCED PO) Take by mouth daily.    [provider]  silver sulfADIAZINE (SILVADENE) 1 % cream Apply 1 application topically daily. 07/22/17   Guadalupe Maple, MD    Family History  Problem Relation Age of Onset  . Cancer Mother   .  Davies disease Father   . Breast cancer Paternal Aunt 71     Social History   Tobacco Use  . Smoking status: Never Smoker  . Smokeless tobacco: Never Used  Substance Use Topics  . Alcohol use: Yes    Alcohol/week: 4.0 standard drinks    Types: 4 Shots of liquor per week    Comment: mixed drink   . Drug use: No    Allergies as of 09/30/2019  . (No Known Allergies)    Review of Systems:    All systems reviewed and negative except where noted in HPI.   Physical Exam:  There were no vitals taken for this visit. No LMP recorded. Patient is postmenopausal. General:   Alert,  Well-developed, well-nourished, pleasant and cooperative in NAD Head:  Normocephalic and atraumatic. Eyes:  Sclera clear, no icterus.   Conjunctiva pink. Ears:  Normal auditory acuity. Neck:  Supple; no masses or thyromegaly. Lungs:  Respirations even and unlabored.  Clear throughout to  auscultation.   No wheezes, crackles, or rhonchi. No acute distress. Davies:  Regular rate and rhythm; positive murmurs, clicks, rubs, or gallops. Abdomen:  Normal bowel sounds.  No bruits.  Soft, non-tender and non-distended without masses, hepatosplenomegaly or hernias noted.  No guarding or rebound tenderness.  Negative Carnett sign.   Rectal:  Deferred.  Pulses:  Normal pulses noted. Extremities:  No clubbing or edema.  No cyanosis. Neurologic:  Alert and oriented x3;  grossly normal neurologically. Skin:  Intact without significant lesions or rashes.  No jaundice. Lymph Nodes:  No significant cervical adenopathy. Psych:  Alert and cooperative. Normal mood and affect.  Imaging Studies: No results found.  Assessment and Plan:   Maria Davies is a 79 y.o. y/o female who comes in today with a finding of iron deficiency anemia.  The patient's last colonoscopy was when she was 54.  The patient will be set up for an EGD and colonoscopy to look for source of her anemia. I have discussed risks & benefits which include, but are not limited to, bleeding, infection, perforation & drug reaction.  The patient agrees with this plan & written consent will be obtained.       Lucilla Lame, MD. [facility]al    Note: This dictation was prepared with Dragon dictation along with smaller phrase technology. Any transcriptional errors that result from this process are unintentional.

## 2019-10-12 ENCOUNTER — Other Ambulatory Visit: Payer: Self-pay

## 2019-10-12 ENCOUNTER — Encounter: Payer: Self-pay | Admitting: Gastroenterology

## 2019-10-12 DIAGNOSIS — Z01810 Encounter for preprocedural cardiovascular examination: Secondary | ICD-10-CM | POA: Diagnosis not present

## 2019-10-12 DIAGNOSIS — I10 Essential (primary) hypertension: Secondary | ICD-10-CM | POA: Diagnosis not present

## 2019-10-12 DIAGNOSIS — E7800 Pure hypercholesterolemia, unspecified: Secondary | ICD-10-CM | POA: Diagnosis not present

## 2019-10-12 DIAGNOSIS — I350 Nonrheumatic aortic (valve) stenosis: Secondary | ICD-10-CM | POA: Diagnosis not present

## 2019-10-15 ENCOUNTER — Ambulatory Visit: Payer: Medicare Other | Admitting: Gastroenterology

## 2019-10-15 ENCOUNTER — Other Ambulatory Visit
Admission: RE | Admit: 2019-10-15 | Discharge: 2019-10-15 | Disposition: A | Discharge status: Home / Self Care | Payer: Medicare Other | Source: Ambulatory Visit | Attending: Gastroenterology | Admitting: Gastroenterology

## 2019-10-15 DIAGNOSIS — Z20822 Contact with and (suspected) exposure to covid-19: Secondary | ICD-10-CM | POA: Insufficient documentation

## 2019-10-15 DIAGNOSIS — Z01812 Encounter for preprocedural laboratory examination: Secondary | ICD-10-CM | POA: Diagnosis not present

## 2019-10-15 LAB — SARS CORONAVIRUS 2 (TAT 6-24 HRS): SARS Coronavirus 2: NEGATIVE

## 2019-10-16 ENCOUNTER — Other Ambulatory Visit: Payer: Medicare Other

## 2019-10-16 NOTE — Discharge Instructions (Signed)
General Anesthesia, Adult, Care After This sheet gives you information about how to care for yourself after your procedure. Your health care provider may also give you more specific instructions. If you have problems or questions, contact your health care provider. What can I expect after the procedure? After the procedure, the following side effects are common:  Pain or discomfort at the IV site.  Nausea.  Vomiting.  Sore throat.  Trouble concentrating.  Feeling cold or chills.  Weak or tired.  Sleepiness and fatigue.  Soreness and body aches. These side effects can affect parts of the body that were not involved in surgery. Follow these instructions at home:  For at least 24 hours after the procedure:  Have a responsible adult stay with you. It is important to have someone help care for you until you are awake and alert.  Rest as needed.  Do not: ? Participate in activities in which you could fall or become injured. ? Drive. ? Use heavy machinery. ? Drink alcohol. ? Take sleeping pills or medicines that cause drowsiness. ? Make important decisions or sign legal documents. ? Take care of children on your own. Eating and drinking  Follow any instructions from your health care provider about eating or drinking restrictions.  When you feel hungry, start by eating small amounts of foods that are soft and easy to digest (bland), such as toast. Gradually return to your regular diet.  Drink enough fluid to keep your urine pale yellow.  If you vomit, rehydrate by drinking water, juice, or clear broth. General instructions  If you have sleep apnea, surgery and certain medicines can increase your risk for breathing problems. Follow instructions from your health care provider about wearing your sleep device: ? Anytime you are sleeping, including during daytime naps. ? While taking prescription pain medicines, sleeping medicines, or medicines that make you drowsy.  Return to  your normal activities as told by your health care provider. Ask your health care provider what activities are safe for you.  Take over-the-counter and prescription medicines only as told by your health care provider.  If you smoke, do not smoke without supervision.  Keep all follow-up visits as told by your health care provider. This is important. Contact a health care provider if:  You have nausea or vomiting that does not get better with medicine.  You cannot eat or drink without vomiting.  You have pain that does not get better with medicine.  You are unable to pass urine.  You develop a skin rash.  You have a fever.  You have redness around your IV site that gets worse. Get help right away if:  You have difficulty breathing.  You have chest pain.  You have blood in your urine or stool, or you vomit blood. Summary  After the procedure, it is common to have a sore throat or nausea. It is also common to feel tired.  Have a responsible adult stay with you for the first 24 hours after general anesthesia. It is important to have someone help care for you until you are awake and alert.  When you feel hungry, start by eating small amounts of foods that are soft and easy to digest (bland), such as toast. Gradually return to your regular diet.  Drink enough fluid to keep your urine pale yellow.  Return to your normal activities as told by your health care provider. Ask your health care provider what activities are safe for you. This information is not   intended to replace advice given to you by your health care provider. Make sure you discuss any questions you have with your health care provider. Document Revised: 06/07/2017 Document Reviewed: 01/18/2017 Elsevier Patient Education  2020 [employer]  

## 2019-10-17 HISTORY — PX: RIGHT COLECTOMY: SHX853

## 2019-10-17 HISTORY — PX: COLON SURGERY: SHX602

## 2019-10-19 ENCOUNTER — Ambulatory Visit: Payer: Medicare Other | Admitting: Anesthesiology

## 2019-10-19 ENCOUNTER — Other Ambulatory Visit: Payer: Self-pay

## 2019-10-19 ENCOUNTER — Encounter: Payer: Self-pay | Admitting: Gastroenterology

## 2019-10-19 ENCOUNTER — Encounter
Admission: RE | Disposition: A | Discharge status: Home / Self Care | Payer: Self-pay | Source: Home / Self Care | Attending: Gastroenterology

## 2019-10-19 ENCOUNTER — Ambulatory Visit
Admission: RE | Admit: 2019-10-19 | Discharge: 2019-10-19 | Disposition: A | Discharge status: Home / Self Care | Payer: Medicare Other | Attending: Gastroenterology | Admitting: Gastroenterology

## 2019-10-19 DIAGNOSIS — Z79899 Other long term (current) drug therapy: Secondary | ICD-10-CM | POA: Insufficient documentation

## 2019-10-19 DIAGNOSIS — Z853 Personal history of malignant neoplasm of breast: Secondary | ICD-10-CM | POA: Insufficient documentation

## 2019-10-19 DIAGNOSIS — I10 Essential (primary) hypertension: Secondary | ICD-10-CM | POA: Diagnosis not present

## 2019-10-19 DIAGNOSIS — K449 Diaphragmatic hernia without obstruction or gangrene: Secondary | ICD-10-CM | POA: Insufficient documentation

## 2019-10-19 DIAGNOSIS — Z79890 Hormone replacement therapy (postmenopausal): Secondary | ICD-10-CM | POA: Diagnosis not present

## 2019-10-19 DIAGNOSIS — K222 Esophageal obstruction: Secondary | ICD-10-CM | POA: Diagnosis not present

## 2019-10-19 DIAGNOSIS — D500 Iron deficiency anemia secondary to blood loss (chronic): Secondary | ICD-10-CM

## 2019-10-19 DIAGNOSIS — Z538 Procedure and treatment not carried out for other reasons: Secondary | ICD-10-CM | POA: Diagnosis not present

## 2019-10-19 DIAGNOSIS — D509 Iron deficiency anemia, unspecified: Secondary | ICD-10-CM | POA: Diagnosis not present

## 2019-10-19 DIAGNOSIS — Z85828 Personal history of other malignant neoplasm of skin: Secondary | ICD-10-CM | POA: Diagnosis not present

## 2019-10-19 DIAGNOSIS — Z7982 Long term (current) use of aspirin: Secondary | ICD-10-CM | POA: Insufficient documentation

## 2019-10-19 DIAGNOSIS — E039 Hypothyroidism, unspecified: Secondary | ICD-10-CM | POA: Diagnosis not present

## 2019-10-19 DIAGNOSIS — D508 Other iron deficiency anemias: Secondary | ICD-10-CM | POA: Diagnosis not present

## 2019-10-19 HISTORY — DX: Unspecified osteoarthritis, unspecified site: M19.90

## 2019-10-19 HISTORY — PX: ESOPHAGEAL DILATION: SHX303

## 2019-10-19 HISTORY — DX: Anemia, unspecified: D64.9

## 2019-10-19 HISTORY — PX: COLONOSCOPY WITH PROPOFOL: SHX5780

## 2019-10-19 HISTORY — DX: Cardiac murmur, unspecified: R01.1

## 2019-10-19 HISTORY — PX: ESOPHAGOGASTRODUODENOSCOPY (EGD) WITH PROPOFOL: SHX5813

## 2019-10-19 HISTORY — DX: Hypothyroidism, unspecified: E03.9

## 2019-10-19 SURGERY — COLONOSCOPY WITH PROPOFOL
Anesthesia: General | Site: Throat

## 2019-10-19 MED ORDER — OXYCODONE HCL 5 MG/5ML PO SOLN: 5.0000 mg | Freq: Once | ORAL | Status: DC | PRN

## 2019-10-19 MED ORDER — MEPERIDINE HCL 25 MG/ML IJ SOLN: 6.2500 mg | INTRAMUSCULAR | Status: DC | PRN

## 2019-10-19 MED ORDER — PEG 3350-KCL-NA BICARB-NACL 420 G PO SOLR: ORAL | 0 refills | Status: DC

## 2019-10-19 MED ORDER — STERILE WATER FOR IRRIGATION IR SOLN
Status: DC | PRN
  Administered 2019-10-19: .05 mL

## 2019-10-19 MED ORDER — GLYCOPYRROLATE 0.2 MG/ML IJ SOLN
INTRAMUSCULAR | Status: DC | PRN
  Administered 2019-10-19 (×2): .1 mg via INTRAVENOUS

## 2019-10-19 MED ORDER — FENTANYL CITRATE (PF) 100 MCG/2ML IJ SOLN: 25.0000 ug | INTRAMUSCULAR | Status: DC | PRN

## 2019-10-19 MED ORDER — LIDOCAINE HCL (CARDIAC) PF 100 MG/5ML IV SOSY
PREFILLED_SYRINGE | INTRAVENOUS | Status: DC | PRN
  Administered 2019-10-19: 20 mg via INTRAVENOUS

## 2019-10-19 MED ORDER — PROMETHAZINE HCL 25 MG/ML IJ SOLN
6.2500 mg | INTRAMUSCULAR | Status: DC | PRN
Start: 2019-10-19 — End: 2019-10-19

## 2019-10-19 MED ORDER — OXYCODONE HCL 5 MG PO TABS: 5.0000 mg | ORAL_TABLET | Freq: Once | ORAL | Status: DC | PRN

## 2019-10-19 MED ORDER — PROPOFOL 10 MG/ML IV BOLUS
INTRAVENOUS | Status: DC | PRN
  Administered 2019-10-19 (×2): 20 mg via INTRAVENOUS
  Administered 2019-10-19: 40 mg via INTRAVENOUS
  Administered 2019-10-19: 30 mg via INTRAVENOUS
  Administered 2019-10-19: 80 mg via INTRAVENOUS
  Administered 2019-10-19: 20 mg via INTRAVENOUS
  Administered 2019-10-19: 30 mg via INTRAVENOUS

## 2019-10-19 MED ORDER — LACTATED RINGERS IV SOLN
10.0000 mL/h | INTRAVENOUS | Status: DC
  Administered 2019-10-19: 10 mL/h via INTRAVENOUS

## 2019-10-19 SURGICAL SUPPLY — 8 items
BALLN DILATOR 15-18 8 (BALLOONS) ×5
BALLOON DILATOR 15-18 8 (BALLOONS) IMPLANT
BLOCK BITE 60FR ADLT L/F GRN (MISCELLANEOUS) ×5 IMPLANT
GOWN CVR UNV OPN BCK APRN NK (MISCELLANEOUS) ×6 IMPLANT
GOWN ISOL THUMB LOOP REG UNIV (MISCELLANEOUS) ×10
KIT ENDO PROCEDURE OLY (KITS) ×5 IMPLANT
SYR INFLATION 60ML (SYRINGE) ×2 IMPLANT
WATER STERILE IRR 250ML POUR (IV SOLUTION) ×5 IMPLANT

## 2019-10-19 NOTE — Anesthesia Procedure Notes (Signed)
Procedure Name: MAC Date/Time: 10/19/2019 8:51 AM Performed by: Vanetta Shawl, CRNA Pre-anesthesia Checklist: Patient identified, Emergency Drugs available, Suction available, Timeout performed and Patient being monitored Patient Re-evaluated:Patient Re-evaluated prior to induction Oxygen Delivery Method: Nasal cannula Placement Confirmation: positive ETCO2

## 2019-10-19 NOTE — Interval H&P Note (Signed)
History and Physical Interval Note:  10/19/2019 8:04 AM  Maria Davies  has presented today for surgery, with the diagnosis of Iron deficiency anemia D50.0.  The various methods of treatment have been discussed with the patient and family. After consideration of risks, benefits and other options for treatment, the patient has consented to  Procedure(s) with comments: COLONOSCOPY WITH PROPOFOL (N/A) - priority 3 ESOPHAGOGASTRODUODENOSCOPY (EGD) WITH PROPOFOL (N/A) as a surgical intervention.  The patient's history has been reviewed, patient examined, no change in status, stable for surgery.  I have reviewed the patient's chart and labs.  Questions were answered to the patient's satisfaction.     Katrinia Straker [employer]

## 2019-10-19 NOTE — Transfer of Care (Signed)
Immediate Anesthesia Transfer of Care Note  Patient: Maria Davies  Procedure(s) Performed: COLONOSCOPY WITH PROPOFOL-attempted, unable to perform poor prep (N/A Rectum) ESOPHAGOGASTRODUODENOSCOPY (EGD) WITH PROPOFOL (N/A Throat) ESOPHAGEAL DILATION (Throat)  Patient Location: PACU  Anesthesia Type: General  Level of Consciousness: awake, alert  and patient cooperative  Airway and Oxygen Therapy: Patient Spontanous Breathing and Patient connected to supplemental oxygen  Post-op Assessment: Post-op Vital signs reviewed, Patient's Cardiovascular Status Stable, Respiratory Function Stable, Patent Airway and No signs of Nausea or vomiting  Post-op Vital Signs: Reviewed and stable  Complications: No apparent anesthesia complications

## 2019-10-19 NOTE — Op Note (Signed)
[facility] Gastroenterology Patient Name: Maria Davies Procedure Date: 10/19/2019 8:41 AM MRN: [id number] Account #: [id number] Date of Birth: 1940/10/03 Admit Type: Outpatient Age: 79 Room: [facility] OR ROOM 01 Gender: Female Note Status: Finalized Procedure:             Colonoscopy Indications:           Iron deficiency anemia Providers:             Lucilla Lame MD, MD Referring MD:          Valerie Roys (Referring MD) Medicines:             Propofol per Anesthesia Complications:         No immediate complications. Procedure:             Pre-Anesthesia Assessment:                        - Prior to the procedure, a History and Physical was                         performed, and patient medications and allergies were                         reviewed. The patient's tolerance of previous                         anesthesia was also reviewed. The risks and benefits                         of the procedure and the sedation options and risks                         were discussed with the patient. All questions were                         answered, and informed consent was obtained. Prior                         Anticoagulants: The patient has taken no previous                         anticoagulant or antiplatelet agents. ASA Grade                         Assessment: II - A patient with mild systemic disease.                         After reviewing the risks and benefits, the patient                         was deemed in satisfactory condition to undergo the                         procedure.                        After obtaining informed consent, the colonoscope was  passed under direct vision. Throughout the procedure,                         the patient's blood pressure, pulse, and oxygen                         saturations were monitored continuously. The was                         introduced through the anus with the intention of                  advancing to the cecum. The scope was advanced to the                         rectum before the procedure was aborted. Medications                         were given. The colonoscopy was performed without                         difficulty. The quality of the bowel preparation was                         not adequate to identify polyps 6 mm and larger in                         size. Findings:      The perianal and digital rectal examinations were normal.      A large amount of stool was found in the rectum, precluding       visualization. Impression:            - Preparation of the colon was inadequate.                        - Stool in the rectum.                        - No specimens collected. Recommendation:        - Discharge patient to home.                        - Resume previous diet.                        - Continue present medications.                        - Repeat colonoscopy because the bowel preparation was                         poor. Procedure Code(s):     --- Professional ---                        [phone], 53, Colonoscopy, flexible; diagnostic,                         including collection of specimen(s) by brushing or  washing, when performed (separate procedure) Diagnosis Code(s):     --- Professional ---                        D50.9, Iron deficiency anemia, unspecified CPT copyright 2019 American Medical Association. All rights reserved. The codes documented in this report are preliminary and upon coder review may  be revised to meet current compliance requirements. Lucilla Lame MD, MD 10/19/2019 9:08:57 AM This report has been signed electronically. Number of Addenda: 0 Note Initiated On: 10/19/2019 8:41 AM Total Procedure Duration: 0 hours 1 minute 1 second  Estimated Blood Loss:  Estimated blood loss: none.      [facility]

## 2019-10-19 NOTE — Anesthesia Postprocedure Evaluation (Signed)
Anesthesia Post Note  Patient: Maria Davies  Procedure(s) Performed: COLONOSCOPY WITH PROPOFOL-attempted, unable to perform poor prep (N/A Rectum) ESOPHAGOGASTRODUODENOSCOPY (EGD) WITH PROPOFOL (N/A Throat) ESOPHAGEAL DILATION (Throat)     Patient location during evaluation: PACU Anesthesia Type: General Level of consciousness: awake and alert Pain management: pain level controlled Vital Signs Assessment: post-procedure vital signs reviewed and stable Respiratory status: spontaneous breathing, nonlabored ventilation, respiratory function stable and patient connected to nasal cannula oxygen Cardiovascular status: blood pressure returned to baseline and stable Postop Assessment: no apparent nausea or vomiting Anesthetic complications: no    [name] ELAINE

## 2019-10-19 NOTE — Anesthesia Preprocedure Evaluation (Signed)
Anesthesia Evaluation  Patient identified by MRN, date of birth, ID band Patient awake    Reviewed: Allergy & Precautions, H&P , NPO status , Patient's Chart, lab work & pertinent test results, reviewed documented beta blocker date and time   Airway Mallampati: II  TM Distance: >3 FB Neck ROM: full    Dental no notable dental hx.    Pulmonary neg pulmonary ROS,    Pulmonary exam normal breath sounds clear to auscultation       Cardiovascular Exercise Tolerance: Good hypertension, + Valvular Problems/Murmurs  Rhythm:regular Rate:Normal     Neuro/Psych negative neurological ROS  negative psych ROS   GI/Hepatic negative GI ROS, Neg liver ROS,   Endo/Other  Hypothyroidism   Renal/GU negative Renal ROS  negative genitourinary   Musculoskeletal   Abdominal   Peds  Hematology  (+) Blood dyscrasia, anemia ,   Anesthesia Other Findings  Breast cancer ([place]) 2010 RT LUMPECTOMY   Reproductive/Obstetrics negative OB ROS                             Anesthesia Physical Anesthesia Plan  ASA: II  Anesthesia Plan: General   Post-op Pain Management:    Induction:   PONV Risk Score and Plan: 3 and Propofol infusion, TIVA and Treatment may vary due to age or medical condition  Airway Management Planned:   Additional Equipment:   Intra-op Plan:   Post-operative Plan:   Informed Consent: I have reviewed the patients History and Physical, chart, labs and discussed the procedure including the risks, benefits and alternatives for the proposed anesthesia with the patient or authorized representative who has indicated his/her understanding and acceptance.     Dental Advisory Given  Plan Discussed with: CRNA  Anesthesia Plan Comments:         Anesthesia Quick Evaluation

## 2019-10-19 NOTE — Op Note (Signed)
[facility] Gastroenterology Patient Name: Maria Davies Procedure Date: 10/19/2019 8:42 AM MRN: [id number] Account #: [id number] Date of Birth: 12-Nov-1940 Admit Type: Outpatient Age: 79 Room: [facility] OR ROOM 01 Gender: Female Note Status: Finalized Procedure:             Upper GI endoscopy Indications:           Iron deficiency anemia Providers:             Lucilla Lame MD, MD Referring MD:          Valerie Roys (Referring MD) Medicines:             Propofol per Anesthesia Complications:         No immediate complications. Procedure:             Pre-Anesthesia Assessment:                        - Prior to the procedure, a History and Physical was                         performed, and patient medications and allergies were                         reviewed. The patient's tolerance of previous                         anesthesia was also reviewed. The risks and benefits                         of the procedure and the sedation options and risks                         were discussed with the patient. All questions were                         answered, and informed consent was obtained. Prior                         Anticoagulants: The patient has taken no previous                         anticoagulant or antiplatelet agents. ASA Grade                         Assessment: II - A patient with mild systemic disease.                         After reviewing the risks and benefits, the patient                         was deemed in satisfactory condition to undergo the                         procedure.                        After obtaining informed consent, the endoscope was  passed under direct vision. Throughout the procedure,                         the patient's blood pressure, pulse, and oxygen                         saturations were monitored continuously. The                         Endosonoscope was introduced through the mouth, and                     advanced to the second part of duodenum. The upper GI                         endoscopy was accomplished without difficulty. The                         patient tolerated the procedure well. Findings:      A small hiatal hernia was present.      One benign-appearing, intrinsic mild stenosis was found at the       gastroesophageal junction. The stenosis was traversed. A TTS dilator was       passed through the scope. Dilation with a 15-16.5-18 mm balloon dilator       was performed to 18 mm. The dilation site was examined following       endoscope reinsertion and showed complete resolution of luminal       narrowing.      The stomach was normal.      The examined duodenum was normal. Impression:            - Small hiatal hernia.                        - Benign-appearing esophageal stenosis. Dilated.                        - Normal stomach.                        - Normal examined duodenum.                        - No specimens collected. Recommendation:        - Discharge patient to home.                        - Resume previous diet.                        - Continue present medications.                        - Perform a colonoscopy today. Procedure Code(s):     --- Professional ---                        [phone], Esophagogastroduodenoscopy, flexible,                         transoral; with transendoscopic balloon dilation of  esophagus (less than 30 mm diameter) Diagnosis Code(s):     --- Professional ---                        D50.9, Iron deficiency anemia, unspecified                        K22.2, Esophageal obstruction CPT copyright 2019 American Medical Association. All rights reserved. The codes documented in this report are preliminary and upon coder review may  be revised to meet current compliance requirements. Lucilla Lame MD, MD 10/19/2019 9:03:43 AM This report has been signed electronically. Number of Addenda: 0 Note Initiated On:  10/19/2019 8:42 AM Total Procedure Duration: 0 hours 7 minutes 30 seconds  Estimated Blood Loss:  Estimated blood loss: none.      [facility]

## 2019-10-20 ENCOUNTER — Ambulatory Visit: Payer: Medicare Other | Admitting: Anesthesiology

## 2019-10-20 ENCOUNTER — Other Ambulatory Visit: Payer: Self-pay

## 2019-10-20 ENCOUNTER — Encounter: Payer: Self-pay | Admitting: *Deleted

## 2019-10-20 ENCOUNTER — Ambulatory Visit
Admission: RE | Admit: 2019-10-20 | Discharge: 2019-10-20 | Disposition: A | Discharge status: Home / Self Care | Payer: Medicare Other | Attending: Gastroenterology | Admitting: Gastroenterology

## 2019-10-20 ENCOUNTER — Encounter
Admission: RE | Disposition: A | Discharge status: Home / Self Care | Payer: Self-pay | Source: Home / Self Care | Attending: Gastroenterology

## 2019-10-20 DIAGNOSIS — I10 Essential (primary) hypertension: Secondary | ICD-10-CM | POA: Insufficient documentation

## 2019-10-20 DIAGNOSIS — D125 Benign neoplasm of sigmoid colon: Secondary | ICD-10-CM | POA: Diagnosis not present

## 2019-10-20 DIAGNOSIS — Z7982 Long term (current) use of aspirin: Secondary | ICD-10-CM | POA: Diagnosis not present

## 2019-10-20 DIAGNOSIS — D500 Iron deficiency anemia secondary to blood loss (chronic): Secondary | ICD-10-CM | POA: Diagnosis not present

## 2019-10-20 DIAGNOSIS — Z85828 Personal history of other malignant neoplasm of skin: Secondary | ICD-10-CM | POA: Diagnosis not present

## 2019-10-20 DIAGNOSIS — Z853 Personal history of malignant neoplasm of breast: Secondary | ICD-10-CM | POA: Insufficient documentation

## 2019-10-20 DIAGNOSIS — D120 Benign neoplasm of cecum: Secondary | ICD-10-CM | POA: Diagnosis not present

## 2019-10-20 DIAGNOSIS — D124 Benign neoplasm of descending colon: Secondary | ICD-10-CM | POA: Insufficient documentation

## 2019-10-20 DIAGNOSIS — K635 Polyp of colon: Secondary | ICD-10-CM

## 2019-10-20 DIAGNOSIS — M8580 Other specified disorders of bone density and structure, unspecified site: Secondary | ICD-10-CM | POA: Insufficient documentation

## 2019-10-20 DIAGNOSIS — Z79899 Other long term (current) drug therapy: Secondary | ICD-10-CM | POA: Insufficient documentation

## 2019-10-20 DIAGNOSIS — K6389 Other specified diseases of intestine: Secondary | ICD-10-CM | POA: Diagnosis not present

## 2019-10-20 DIAGNOSIS — C183 Malignant neoplasm of hepatic flexure: Secondary | ICD-10-CM | POA: Insufficient documentation

## 2019-10-20 DIAGNOSIS — E039 Hypothyroidism, unspecified: Secondary | ICD-10-CM | POA: Diagnosis not present

## 2019-10-20 DIAGNOSIS — Z9221 Personal history of antineoplastic chemotherapy: Secondary | ICD-10-CM | POA: Diagnosis not present

## 2019-10-20 DIAGNOSIS — D490 Neoplasm of unspecified behavior of digestive system: Secondary | ICD-10-CM | POA: Diagnosis not present

## 2019-10-20 DIAGNOSIS — K640 First degree hemorrhoids: Secondary | ICD-10-CM | POA: Insufficient documentation

## 2019-10-20 DIAGNOSIS — K5730 Diverticulosis of large intestine without perforation or abscess without bleeding: Secondary | ICD-10-CM | POA: Diagnosis not present

## 2019-10-20 DIAGNOSIS — Z803 Family history of malignant neoplasm of breast: Secondary | ICD-10-CM | POA: Diagnosis not present

## 2019-10-20 DIAGNOSIS — C189 Malignant neoplasm of colon, unspecified: Secondary | ICD-10-CM

## 2019-10-20 DIAGNOSIS — D509 Iron deficiency anemia, unspecified: Secondary | ICD-10-CM | POA: Diagnosis not present

## 2019-10-20 DIAGNOSIS — Z923 Personal history of irradiation: Secondary | ICD-10-CM | POA: Insufficient documentation

## 2019-10-20 DIAGNOSIS — Z8249 Family history of ischemic heart disease and other diseases of the circulatory system: Secondary | ICD-10-CM | POA: Diagnosis not present

## 2019-10-20 HISTORY — DX: Malignant neoplasm of colon, unspecified: C18.9

## 2019-10-20 HISTORY — PX: COLONOSCOPY WITH PROPOFOL: SHX5780

## 2019-10-20 SURGERY — COLONOSCOPY WITH PROPOFOL
Anesthesia: General

## 2019-10-20 MED ORDER — SODIUM CHLORIDE 0.9 % IV SOLN
INTRAVENOUS | Status: DC
  Administered 2019-10-20: 1000 mL via INTRAVENOUS

## 2019-10-20 MED ORDER — PROPOFOL 500 MG/50ML IV EMUL
INTRAVENOUS | Status: DC | PRN
  Administered 2019-10-20: 125 ug/kg/min via INTRAVENOUS

## 2019-10-20 MED ORDER — LIDOCAINE HCL (CARDIAC) PF 100 MG/5ML IV SOSY
PREFILLED_SYRINGE | INTRAVENOUS | Status: DC | PRN
  Administered 2019-10-20: 60 mg via INTRAVENOUS

## 2019-10-20 MED ORDER — PROPOFOL 500 MG/50ML IV EMUL
INTRAVENOUS | Status: AC
  Filled 2019-10-20: qty 50

## 2019-10-20 MED ORDER — LIDOCAINE HCL (PF) 2 % IJ SOLN
INTRAMUSCULAR | Status: AC
  Filled 2019-10-20: qty 5

## 2019-10-20 MED ORDER — PROPOFOL 10 MG/ML IV BOLUS
INTRAVENOUS | Status: DC | PRN
Start: 2019-10-20 — End: 2019-10-20
  Administered 2019-10-20 (×2): 50 mg via INTRAVENOUS

## 2019-10-20 NOTE — Anesthesia Postprocedure Evaluation (Signed)
Anesthesia Post Note  Patient: Maria Davies  Procedure(s) Performed: COLONOSCOPY WITH PROPOFOL (N/A )  Patient location during evaluation: Endoscopy Anesthesia Type: General Level of consciousness: awake and alert Pain management: pain level controlled Vital Signs Assessment: post-procedure vital signs reviewed and stable Respiratory status: spontaneous breathing and respiratory function stable Cardiovascular status: stable Anesthetic complications: no     Last Vitals:  Vitals:   10/20/19 1040 10/20/19 1140  BP: 135/60 (!) 96/49  Pulse: 97 97  Resp: 18 18  Temp: (!) 36.2 C (!) 36.2 C  SpO2: 100% 100%    Last Pain:  Vitals:   10/20/19 1140  TempSrc: Temporal  PainSc:                  Yukio Bisping K

## 2019-10-20 NOTE — Interval H&P Note (Signed)
History and Physical Interval Note:  10/20/2019 10:42 AM  Maria Davies  has presented today for surgery, with the diagnosis of iron deficiency anemia D50.0.  The various methods of treatment have been discussed with the patient and family. After consideration of risks, benefits and other options for treatment, the patient has consented to  Procedure(s): COLONOSCOPY WITH PROPOFOL (N/A) as a surgical intervention.  The patient's history has been reviewed, patient examined, no change in status, stable for surgery.  I have reviewed the patient's chart and labs.  Questions were answered to the patient's satisfaction.     Auryn Paige [employer]

## 2019-10-20 NOTE — Anesthesia Preprocedure Evaluation (Signed)
Anesthesia Evaluation  Patient identified by MRN, date of birth, ID band Patient awake    Reviewed: Allergy & Precautions, NPO status , Patient's Chart, lab work & pertinent test results  History of Anesthesia Complications Negative for: history of anesthetic complications  Airway Mallampati: III       Dental   Pulmonary neg sleep apnea, neg COPD, Not current smoker,           Cardiovascular hypertension, Pt. on medications (-) Past MI and (-) CHF (-) dysrhythmias + Valvular Problems/Murmurs AS and AI      Neuro/Psych neg Seizures    GI/Hepatic Neg liver ROS, neg GERD  ,  Endo/Other  neg diabetesHypothyroidism (off meds now)   Renal/GU negative Renal ROS     Musculoskeletal   Abdominal   Peds  Hematology   Anesthesia Other Findings   Reproductive/Obstetrics                            Anesthesia Physical Anesthesia Plan  ASA: III  Anesthesia Plan: General   Post-op Pain Management:    Induction: Intravenous  PONV Risk Score and Plan: 3 and Propofol infusion, TIVA and Treatment may vary due to age or medical condition  Airway Management Planned: Nasal Cannula  Additional Equipment:   Intra-op Plan:   Post-operative Plan:   Informed Consent: I have reviewed the patients History and Physical, chart, labs and discussed the procedure including the risks, benefits and alternatives for the proposed anesthesia with the patient or authorized representative who has indicated his/her understanding and acceptance.       Plan Discussed with:   Anesthesia Plan Comments:         Anesthesia Quick Evaluation

## 2019-10-20 NOTE — Op Note (Signed)
[facility] Gastroenterology Patient Name: Maria Davies Procedure Date: 10/20/2019 11:13 AM MRN: [id number] Account #: [id number] Date of Birth: 1941-05-11 Admit Type: Outpatient Age: 79 Room: [facility] ENDO ROOM 4 Gender: Female Note Status: Finalized Procedure:             Colonoscopy Indications:           Iron deficiency anemia Providers:             Lucilla Lame MD, MD Referring MD:          Guadalupe Maple, MD (Referring MD) Medicines:             Propofol per Anesthesia Complications:         No immediate complications. Procedure:             Pre-Anesthesia Assessment:                        - Prior to the procedure, a History and Physical was                         performed, and patient medications and allergies were                         reviewed. The patient's tolerance of previous                         anesthesia was also reviewed. The risks and benefits                         of the procedure and the sedation options and risks                         were discussed with the patient. All questions were                         answered, and informed consent was obtained. Prior                         Anticoagulants: The patient has taken no previous                         anticoagulant or antiplatelet agents. ASA Grade                         Assessment: II - A patient with mild systemic disease.                         After reviewing the risks and benefits, the patient                         was deemed in satisfactory condition to undergo the                         procedure.                        After obtaining informed consent, the colonoscope was  passed under direct vision. Throughout the procedure,                         the patient's blood pressure, pulse, and oxygen                         saturations were monitored continuously. The                         Colonoscope was introduced through the anus and                  advanced to the the cecum, identified by appendiceal                         orifice and ileocecal valve. The colonoscopy was                         performed without difficulty. The patient tolerated                         the procedure well. The quality of the bowel                         preparation was good. Findings:      The perianal and digital rectal examinations were normal.      A 3 mm polyp was found in the cecum. The polyp was sessile. The polyp       was removed with a cold biopsy forceps. Resection and retrieval were       complete.      An ulcerated partially obstructing large mass was found at the hepatic       flexure. The mass was circumferential. No bleeding was present. This was       biopsied with a cold forceps for histology. Area was tattooed with an       injection of 4 mL of Spot (carbon black).      A 5 mm polyp was found in the descending colon. The polyp was sessile.       The polyp was removed with a cold snare. Resection and retrieval were       complete.      A 5 mm polyp was found in the sigmoid colon. The polyp was sessile. The       polyp was removed with a cold snare. Resection and retrieval were       complete.      Multiple small-mouthed diverticula were found in the entire colon.      Non-bleeding internal hemorrhoids were found during retroflexion. The       hemorrhoids were Grade I (internal hemorrhoids that do not prolapse). Impression:            - One 3 mm polyp in the cecum, removed with a cold                         biopsy forceps. Resected and retrieved.                        - Likely malignant partially obstructing tumor at the  hepatic flexure. Biopsied. Tattooed.                        - One 5 mm polyp in the descending colon, removed with                         a cold snare. Resected and retrieved.                        - One 5 mm polyp in the sigmoid colon, removed with a                          cold snare. Resected and retrieved.                        - Diverticulosis in the entire examined colon.                        - Non-bleeding internal hemorrhoids. Recommendation:        - Discharge patient to home.                        - Resume previous diet.                        - Continue present medications.                        - Await pathology results.                        - Refer to a Psychologist, sport and exercise. Procedure Code(s):     --- Professional ---                        [phone], Colonoscopy, flexible; with removal of                         tumor(s), polyp(s), or other lesion(s) by snare                         technique                        [id number], 59, Colonoscopy, flexible; with biopsy, single                         or multiple                        45381, Colonoscopy, flexible; with directed submucosal                         injection(s), any substance Diagnosis Code(s):     --- Professional ---                        D50.9, Iron deficiency anemia, unspecified                        D49.0, Neoplasm of unspecified behavior of digestive  system                        K63.5, Polyp of colon CPT copyright 2019 American Medical Association. All rights reserved. The codes documented in this report are preliminary and upon coder review may  be revised to meet current compliance requirements. Lucilla Lame MD, MD 10/20/2019 11:38:43 AM This report has been signed electronically. Number of Addenda: 0 Note Initiated On: 10/20/2019 11:13 AM Scope Withdrawal Time: 0 hours 7 minutes 33 seconds  Total Procedure Duration: 0 hours 12 minutes 58 seconds  Estimated Blood Loss:  Estimated blood loss: none.      [facility]

## 2019-10-20 NOTE — Transfer of Care (Signed)
Immediate Anesthesia Transfer of Care Note  Patient: Maria Davies  Procedure(s) Performed: COLONOSCOPY WITH PROPOFOL (N/A )  Patient Location: PACU and Endoscopy Unit  Anesthesia Type:General  Level of Consciousness: awake, alert  and oriented  Airway & Oxygen Therapy: Patient Spontanous Breathing  Post-op Assessment: Report given to RN and Post -op Vital signs reviewed and stable  Post vital signs: Reviewed and stable  Last Vitals:  Vitals Value Taken Time  BP    Temp    Pulse    Resp    SpO2      Last Pain:  Vitals:   10/20/19 1040  TempSrc: Temporal  PainSc: 0-No pain         Complications: No apparent anesthesia complications

## 2019-10-21 ENCOUNTER — Telehealth: Payer: Self-pay

## 2019-10-21 ENCOUNTER — Encounter: Payer: Self-pay | Admitting: *Deleted

## 2019-10-21 ENCOUNTER — Other Ambulatory Visit: Payer: Self-pay | Admitting: Gastroenterology

## 2019-10-21 LAB — SURGICAL PATHOLOGY

## 2019-10-21 NOTE — Telephone Encounter (Signed)
Pt is scheduled with [facility] tomorrow, 10/22/19 for a consultation.

## 2019-10-21 NOTE — Telephone Encounter (Signed)
-----   Message from Lucilla Lame, MD sent at 10/21/2019  9:41 AM EDT ----- Can you make sure this patient has a appointment with surgery for her colon mass.  She was told yesterday that it is likely cancer and that she will need to follow-up with surgery.

## 2019-10-22 ENCOUNTER — Other Ambulatory Visit: Payer: Self-pay

## 2019-10-22 ENCOUNTER — Encounter: Payer: Self-pay | Admitting: Surgery

## 2019-10-22 ENCOUNTER — Ambulatory Visit (INDEPENDENT_AMBULATORY_CARE_PROVIDER_SITE_OTHER): Payer: Medicare Other | Admitting: Surgery

## 2019-10-22 ENCOUNTER — Other Ambulatory Visit
Admission: RE | Admit: 2019-10-22 | Discharge: 2019-10-22 | Disposition: A | Discharge status: Home / Self Care | Payer: Medicare Other | Source: Ambulatory Visit | Attending: Surgery | Admitting: Surgery

## 2019-10-22 ENCOUNTER — Ambulatory Visit: Payer: Self-pay | Admitting: Surgery

## 2019-10-22 VITALS — BP 116/76 | HR 109 | Temp 97.7°F | Resp 12 | Ht 63.0 in | Wt 176.8 lb

## 2019-10-22 DIAGNOSIS — C19 Malignant neoplasm of rectosigmoid junction: Secondary | ICD-10-CM

## 2019-10-22 DIAGNOSIS — K6389 Other specified diseases of intestine: Secondary | ICD-10-CM

## 2019-10-22 LAB — COMPREHENSIVE METABOLIC PANEL
ALT: 38 U/L (ref 0–44)
AST: 25 U/L (ref 15–41)
Albumin: 4 g/dL (ref 3.5–5.0)
Alkaline Phosphatase: 70 U/L (ref 38–126)
Anion gap: 10 (ref 5–15)
BUN: 8 mg/dL (ref 8–23)
CO2: 22 mmol/L (ref 22–32)
Calcium: 9.2 mg/dL (ref 8.9–10.3)
Chloride: 109 mmol/L (ref 98–111)
Creatinine, Ser: 0.6 mg/dL (ref 0.44–1.00)
GFR calc Af Amer: >60 mL/min (ref >60)
GFR calc non Af Amer: >60 mL/min (ref >60)
Glucose, Bld: 118 mg/dL — ABNORMAL HIGH (ref 70–99)
Potassium: 3.8 mmol/L (ref 3.5–5.1)
Sodium: 141 mmol/L (ref 135–145)
Total Bilirubin: 0.7 mg/dL (ref 0.3–1.2)
Total Protein: 6.6 g/dL (ref 6.5–8.1)

## 2019-10-22 LAB — CBC WITH DIFFERENTIAL/PLATELET
Abs Immature Granulocytes: 0.03 10*3/uL (ref 0.00–0.07)
Basophils Absolute: 0.1 10*3/uL (ref 0.0–0.1)
Basophils Relative: 1 %
Eosinophils Absolute: 0.2 10*3/uL (ref 0.0–0.5)
Eosinophils Relative: 3 %
HCT: 36.5 % (ref 36.0–46.0)
Hemoglobin: 12.5 g/dL (ref 12.0–15.0)
Immature Granulocytes: 0 %
Lymphocytes Relative: 28 %
Lymphs Abs: 2.4 10*3/uL (ref 0.7–4.0)
MCH: 30 pg (ref 26.0–34.0)
MCHC: 34.2 g/dL (ref 30.0–36.0)
MCV: 87.5 fL (ref 80.0–100.0)
Monocytes Absolute: 0.9 10*3/uL (ref 0.1–1.0)
Monocytes Relative: 11 %
Neutro Abs: 4.9 10*3/uL (ref 1.7–7.7)
Neutrophils Relative %: 57 %
Platelets: 288 10*3/uL (ref 150–400)
RBC: 4.17 MIL/uL (ref 3.87–5.11)
RDW: 15.1 % (ref 11.5–15.5)
WBC: 8.5 10*3/uL (ref 4.0–10.5)
nRBC: 0 % (ref 0.0–0.2)

## 2019-10-22 MED ORDER — BISACODYL 5 MG PO TBEC
DELAYED_RELEASE_TABLET | ORAL | 0 refills | Status: DC
Start: 2019-10-22 — End: 2019-11-02

## 2019-10-22 MED ORDER — NEOMYCIN SULFATE 500 MG PO TABS
ORAL_TABLET | ORAL | 0 refills | Status: DC
Start: 2019-10-22 — End: 2019-11-02

## 2019-10-22 MED ORDER — POLYETHYLENE GLYCOL 3350 17 GM/SCOOP PO POWD
ORAL | 0 refills | Status: DC
Start: 2019-10-22 — End: 2019-10-26

## 2019-10-22 MED ORDER — ERYTHROMYCIN BASE 500 MG PO TABS: ORAL_TABLET | ORAL | 0 refills | Status: DC

## 2019-10-22 NOTE — H&P (View-Only) (Signed)
Patient ID: Maria Davies, female   DOB: 1941/06/09, 79 y.o.   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  History of Present Illness Maria Davies is a 79 y.o. female with with recent screening colonoscopy revealing a circumferential mass at the hepatic flexure.  Pathology is confirmed adenocarcinoma.  She denies any history of weight loss, is noted to have anemia and had been taking iron.  She denies any current bleeding per rectum.  Denies fevers chills abdominal pain, cough or chest pain.  She has not had any recent scanning.  Past Medical History Past Medical History:  Diagnosis Date  . Anemia   . Arthritis   . Basal cell carcinoma    back  . Breast cancer ([place]) 2010   RT LUMPECTOMY  . Cancer (HCC)    rt breast  . Heart murmur   . Hypertension   . Hypothyroidism    currently not on meds  . Osteopenia   . Personal history of chemotherapy 2010   right breast ca  . Personal history of radiation therapy 2010   right breast ca      Past Surgical History:  Procedure Laterality Date  . BACK SURGERY    . BREAST EXCISIONAL BIOPSY Right 03/2009   radiation and chemo +  . BREAST EXCISIONAL BIOPSY Bilateral    NEG  . BREAST SURGERY    . CATARACT EXTRACTION W/ INTRAOCULAR LENS  IMPLANT, BILATERAL    . CHOLECYSTECTOMY    . COLONOSCOPY WITH PROPOFOL N/A 10/19/2019   Procedure: COLONOSCOPY WITH PROPOFOL-attempted, unable to perform poor prep;  Surgeon: Lucilla Lame, MD;  Location: [place];  Service: Endoscopy;  Laterality: N/A;  priority 3  . COLONOSCOPY WITH PROPOFOL N/A 10/20/2019   Procedure: COLONOSCOPY WITH PROPOFOL;  Surgeon: Lucilla Lame, MD;  Location: [facility] ENDOSCOPY;  Service: Endoscopy;  Laterality: N/A;  . ESOPHAGEAL DILATION  10/19/2019   Procedure: ESOPHAGEAL DILATION;  Surgeon: Lucilla Lame, MD;  Location: [place];  Service: Endoscopy;;  . ESOPHAGOGASTRODUODENOSCOPY (EGD) WITH PROPOFOL N/A 10/19/2019    Procedure: ESOPHAGOGASTRODUODENOSCOPY (EGD) WITH PROPOFOL;  Surgeon: Lucilla Lame, MD;  Location: [place];  Service: Endoscopy;  Laterality: N/A;    No Known Allergies  Current Outpatient Medications  Medication Sig Dispense Refill  . amLODipine (NORVASC) 5 MG tablet Take 1 tablet (5 mg total) by mouth daily. 90 tablet 1  . aspirin EC 81 MG tablet Take 81 mg by mouth daily.    . benazepril (LOTENSIN) 40 MG tablet Take 1 tablet (40 mg total) by mouth daily. 90 tablet 1  . Cholecalciferol (VITAMIN D3) 50 MCG (2000 UT) capsule Take 2,000 Units by mouth daily.    . metoprolol succinate (TOPROL-XL) 50 MG 24 hr tablet Take 1 tablet (50 mg total) by mouth daily. 90 tablet 1  . Multiple Vitamin (MULTI-VITAMINS) TABS Take by mouth.    Marland Kitchen OVER THE COUNTER MEDICATION Omega 3, resveratrol, icariin, curcumin  (Relief Factor)    . polyethylene glycol-electrolytes (GAVILYTE-N WITH FLAVOR PACK) 420 g solution Drink one 8 oz glass every 20 mins until entire container is finished starting at 5:00pm today 4000 mL 0  . Probiotic Product (PROBIOTIC ADVANCED PO) Take by mouth daily.    . silver sulfADIAZINE (SILVADENE) 1 % cream Apply 1 application topically daily. 50 g 0  . bisacodyl (DULCOLAX) 5 MG EC tablet Take all 4 tablets at 8 am the morning prior to your surgery. 4 tablet 0  . erythromycin  base (E-MYCIN) 500 MG tablet Take 2 tablets at 8am, 2 tablets at 2pm, and 2 tablets at 8pm the day prior to surgery. 6 tablet 0  . levothyroxine (SYNTHROID, LEVOTHROID) 25 MCG tablet Take by mouth.    . neomycin (MYCIFRADIN) 500 MG tablet Take 2 tablet at 8am, take 2 tablets at 2pm, and take 2 tablets at 8pm the day prior to your surgery 6 tablet 0  . polyethylene glycol powder (MIRALAX) 17 GM/SCOOP powder Mix full container in 64 ounces of Gatorade or other clear liquid. 238 g 0   No current facility-administered medications for this visit.    Family History Family History  Problem Relation Age of Onset   . Cancer Mother   . Heart disease Father   . Breast cancer Paternal Aunt 81      Social History Social History   Tobacco Use  . Smoking status: Never Smoker  . Smokeless tobacco: Never Used  Substance Use Topics  . Alcohol use: Not Currently    Alcohol/week: 0.0 standard drinks    Comment: No alcohol since 08/05/19  . Drug use: No        Review of Systems  Constitutional: Positive for malaise/fatigue and weight loss.  HENT: Negative.   Eyes: Negative.   Respiratory: Negative.   Cardiovascular: Negative.   Gastrointestinal: Negative.   Genitourinary: Negative.   Musculoskeletal: Negative.   Skin: Negative.   Neurological: Negative.   Endo/Heme/Allergies: Negative.       Physical Exam Blood pressure 116/76, pulse (!) 109, temperature 97.7 F (36.5 C), resp. rate 12, height 5\' 3"  (1.6 m), weight 176 lb 12.8 oz (80.2 kg), SpO2 97 %. Last Weight  Most recent update: 10/22/2019  2:59 PM   Weight  80.2 kg (176 lb 12.8 oz)            CONSTITUTIONAL: Well developed, and nourished, appropriately responsive and aware without distress.   EYES: Sclera non-icteric.   EARS, NOSE, MOUTH AND THROAT: Mask worn.  Hearing is intact to voice.  NECK: Trachea is midline, and there is no jugular venous distension.  LYMPH NODES:  Lymph nodes in the neck are not enlarged. RESPIRATORY:  Lungs are clear, and breath sounds are equal bilaterally. Normal respiratory effort without pathologic use of accessory muscles. CARDIOVASCULAR: Heart is regular in rate and rhythm. GI: The abdomen is soft, nontender, and nondistended. There is a RUQ palpable mass.  MUSCULOSKELETAL:  Symmetrical muscle tone appreciated in all four extremities.    SKIN: Skin turgor is normal. No pathologic skin lesions appreciated.  NEUROLOGIC:  Motor and sensation appear grossly normal.  Cranial nerves are grossly without defect. PSYCH:  Alert and oriented to person, place and time. Affect is appropriate for  situation.  Data Reviewed I have personally reviewed what is currently available of the patient's imaging, recent labs and medical records.   Labs:  CBC Latest Ref Rng & Units 08/10/2019 08/05/2019 07/28/2018  WBC 3.4 - 10.8 x10E3/uL 6.1 5.4 6.5  Hemoglobin 11.1 - 15.9 g/dL 10.3(L) 10.2(L) 13.5  Hematocrit 34.0 - 46.6 % 33.5(L) 33.2(L) 39.1  Platelets 150 - 450 x10E3/uL 307 266 227   CMP Latest Ref Rng & Units 08/05/2019 01/28/2019 07/28/2018  Glucose 65 - 99 mg/dL 98 97 125(H)  BUN 8 - 27 mg/dL 11 9 10   Creatinine 0.57 - 1.00 mg/dL 0.53(L) 0.54(L) 0.59  Sodium 134 - 144 mmol/L 140 141 139  Potassium 3.5 - 5.2 mmol/L 4.2 4.2 3.9  Chloride 96 - 106  mmol/L 102 100 100  CO2 20 - 29 mmol/L 23 22 23   Calcium 8.7 - 10.3 mg/dL 9.4 9.4 9.3  Total Protein 6.0 - 8.5 g/dL 6.4 6.5 6.7  Total Bilirubin 0.0 - 1.2 mg/dL 0.2 0.4 0.5  Alkaline Phos 39 - 117 IU/L 70 70 79  AST 0 - 40 IU/L 18 15 20   ALT 0 - 32 IU/L 13 12 14       Imaging:  Within last 24 hrs: No results found.  Assessment    Right colon carcinoma, involving hepatic flexure Patient Active Problem List   Diagnosis Date Noted  . Iron deficiency anemia secondary to blood loss (chronic)   . Polyp of sigmoid colon   . Benign neoplasm of cecum   . Intestines neoplasm   . Iron deficiency anemia due to chronic blood loss   . Stricture and stenosis of esophagus   . Pre-diabetes 08/26/2018  . Advanced care planning/counseling discussion 07/22/2017  . Hypercholesteremia 07/03/2016  . Osteoporosis 07/03/2016  . Carcinoma of right breast ([place]) 06/19/2016  . Aortic stenosis, mild 05/02/2015  . Hypertension 05/02/2015  . Hypothyroid 05/02/2015  . Breast cancer in female [facility]) 05/02/2015    Plan    Will get a staging CT scan of chest,  abdomen and pelvis. Blood work of CEA, CBC and CMP. Plan for robotic right hemicolectomy. Risks and benefits of the above procedure been discussed in detail which include but not limited to  anesthesia, bleeding, infection, leak/obstruction, scarring etc.  I believe she understands these are not all inclusive and desires to proceed.  Questions elicited and answered.  No guarantees ever expressed or implied. We discussed her bowel prep and coordinating that with obtaining her imaging preoperatively.  Face-to-face time spent with the patient and accompanying care providers(if present) was 45 minutes, with more than 50% of the time spent counseling, educating, and coordinating care of the patient.      Ronny Bacon M.D., FACS 10/22/2019, 3:30 PM

## 2019-10-22 NOTE — Progress Notes (Signed)
Patient ID: Maria Davies, female   DOB: Apr 15, 1941, 79 y.o.   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  History of Present Illness Maria Davies is a 79 y.o. female with with recent screening colonoscopy revealing a circumferential mass at the hepatic flexure.  Pathology is confirmed adenocarcinoma.  She denies any history of weight loss, is noted to have anemia and had been taking iron.  She denies any current bleeding per rectum.  Denies fevers chills abdominal pain, cough or chest pain.  She has not had any recent scanning.  Past Medical History Past Medical History:  Diagnosis Date  . Anemia   . Arthritis   . Basal cell carcinoma    back  . Breast cancer ([place]) 2010   RT LUMPECTOMY  . Cancer (HCC)    rt breast  . Heart murmur   . Hypertension   . Hypothyroidism    currently not on meds  . Osteopenia   . Personal history of chemotherapy 2010   right breast ca  . Personal history of radiation therapy 2010   right breast ca      Past Surgical History:  Procedure Laterality Date  . BACK SURGERY    . BREAST EXCISIONAL BIOPSY Right 03/2009   radiation and chemo +  . BREAST EXCISIONAL BIOPSY Bilateral    NEG  . BREAST SURGERY    . CATARACT EXTRACTION W/ INTRAOCULAR LENS  IMPLANT, BILATERAL    . CHOLECYSTECTOMY    . COLONOSCOPY WITH PROPOFOL N/A 10/19/2019   Procedure: COLONOSCOPY WITH PROPOFOL-attempted, unable to perform poor prep;  Surgeon: Lucilla Lame, MD;  Location: [place];  Service: Endoscopy;  Laterality: N/A;  priority 3  . COLONOSCOPY WITH PROPOFOL N/A 10/20/2019   Procedure: COLONOSCOPY WITH PROPOFOL;  Surgeon: Lucilla Lame, MD;  Location: [facility] ENDOSCOPY;  Service: Endoscopy;  Laterality: N/A;  . ESOPHAGEAL DILATION  10/19/2019   Procedure: ESOPHAGEAL DILATION;  Surgeon: Lucilla Lame, MD;  Location: [place];  Service: Endoscopy;;  . ESOPHAGOGASTRODUODENOSCOPY (EGD) WITH PROPOFOL N/A 10/19/2019    Procedure: ESOPHAGOGASTRODUODENOSCOPY (EGD) WITH PROPOFOL;  Surgeon: Lucilla Lame, MD;  Location: [place];  Service: Endoscopy;  Laterality: N/A;    No Known Allergies  Current Outpatient Medications  Medication Sig Dispense Refill  . amLODipine (NORVASC) 5 MG tablet Take 1 tablet (5 mg total) by mouth daily. 90 tablet 1  . aspirin EC 81 MG tablet Take 81 mg by mouth daily.    . benazepril (LOTENSIN) 40 MG tablet Take 1 tablet (40 mg total) by mouth daily. 90 tablet 1  . Cholecalciferol (VITAMIN D3) 50 MCG (2000 UT) capsule Take 2,000 Units by mouth daily.    . metoprolol succinate (TOPROL-XL) 50 MG 24 hr tablet Take 1 tablet (50 mg total) by mouth daily. 90 tablet 1  . Multiple Vitamin (MULTI-VITAMINS) TABS Take by mouth.    Marland Kitchen OVER THE COUNTER MEDICATION Omega 3, resveratrol, icariin, curcumin  (Relief Factor)    . polyethylene glycol-electrolytes (GAVILYTE-N WITH FLAVOR PACK) 420 g solution Drink one 8 oz glass every 20 mins until entire container is finished starting at 5:00pm today 4000 mL 0  . Probiotic Product (PROBIOTIC ADVANCED PO) Take by mouth daily.    . silver sulfADIAZINE (SILVADENE) 1 % cream Apply 1 application topically daily. 50 g 0  . bisacodyl (DULCOLAX) 5 MG EC tablet Take all 4 tablets at 8 am the morning prior to your surgery. 4 tablet 0  . erythromycin  base (E-MYCIN) 500 MG tablet Take 2 tablets at 8am, 2 tablets at 2pm, and 2 tablets at 8pm the day prior to surgery. 6 tablet 0  . levothyroxine (SYNTHROID, LEVOTHROID) 25 MCG tablet Take by mouth.    . neomycin (MYCIFRADIN) 500 MG tablet Take 2 tablet at 8am, take 2 tablets at 2pm, and take 2 tablets at 8pm the day prior to your surgery 6 tablet 0  . polyethylene glycol powder (MIRALAX) 17 GM/SCOOP powder Mix full container in 64 ounces of Gatorade or other clear liquid. 238 g 0   No current facility-administered medications for this visit.    Family History Family History  Problem Relation Age of Onset   . Cancer Mother   . Heart disease Father   . Breast cancer Paternal Aunt 37      Social History Social History   Tobacco Use  . Smoking status: Never Smoker  . Smokeless tobacco: Never Used  Substance Use Topics  . Alcohol use: Not Currently    Alcohol/week: 0.0 standard drinks    Comment: No alcohol since 08/05/19  . Drug use: No        Review of Systems  Constitutional: Positive for malaise/fatigue and weight loss.  HENT: Negative.   Eyes: Negative.   Respiratory: Negative.   Cardiovascular: Negative.   Gastrointestinal: Negative.   Genitourinary: Negative.   Musculoskeletal: Negative.   Skin: Negative.   Neurological: Negative.   Endo/Heme/Allergies: Negative.       Physical Exam Blood pressure 116/76, pulse (!) 109, temperature 97.7 F (36.5 C), resp. rate 12, height 5\' 3"  (1.6 m), weight 176 lb 12.8 oz (80.2 kg), SpO2 97 %. Last Weight  Most recent update: 10/22/2019  2:59 PM   Weight  80.2 kg (176 lb 12.8 oz)            CONSTITUTIONAL: Well developed, and nourished, appropriately responsive and aware without distress.   EYES: Sclera non-icteric.   EARS, NOSE, MOUTH AND THROAT: Mask worn.  Hearing is intact to voice.  NECK: Trachea is midline, and there is no jugular venous distension.  LYMPH NODES:  Lymph nodes in the neck are not enlarged. RESPIRATORY:  Lungs are clear, and breath sounds are equal bilaterally. Normal respiratory effort without pathologic use of accessory muscles. CARDIOVASCULAR: Heart is regular in rate and rhythm. GI: The abdomen is soft, nontender, and nondistended. There is a RUQ palpable mass.  MUSCULOSKELETAL:  Symmetrical muscle tone appreciated in all four extremities.    SKIN: Skin turgor is normal. No pathologic skin lesions appreciated.  NEUROLOGIC:  Motor and sensation appear grossly normal.  Cranial nerves are grossly without defect. PSYCH:  Alert and oriented to person, place and time. Affect is appropriate for  situation.  Data Reviewed I have personally reviewed what is currently available of the patient's imaging, recent labs and medical records.   Labs:  CBC Latest Ref Rng & Units 08/10/2019 08/05/2019 07/28/2018  WBC 3.4 - 10.8 x10E3/uL 6.1 5.4 6.5  Hemoglobin 11.1 - 15.9 g/dL 10.3(L) 10.2(L) 13.5  Hematocrit 34.0 - 46.6 % 33.5(L) 33.2(L) 39.1  Platelets 150 - 450 x10E3/uL 307 266 227   CMP Latest Ref Rng & Units 08/05/2019 01/28/2019 07/28/2018  Glucose 65 - 99 mg/dL 98 97 125(H)  BUN 8 - 27 mg/dL 11 9 10   Creatinine 0.57 - 1.00 mg/dL 0.53(L) 0.54(L) 0.59  Sodium 134 - 144 mmol/L 140 141 139  Potassium 3.5 - 5.2 mmol/L 4.2 4.2 3.9  Chloride 96 - 106  mmol/L 102 100 100  CO2 20 - 29 mmol/L 23 22 23   Calcium 8.7 - 10.3 mg/dL 9.4 9.4 9.3  Total Protein 6.0 - 8.5 g/dL 6.4 6.5 6.7  Total Bilirubin 0.0 - 1.2 mg/dL 0.2 0.4 0.5  Alkaline Phos 39 - 117 IU/L 70 70 79  AST 0 - 40 IU/L 18 15 20   ALT 0 - 32 IU/L 13 12 14       Imaging:  Within last 24 hrs: No results found.  Assessment    Right colon carcinoma, involving hepatic flexure Patient Active Problem List   Diagnosis Date Noted  . Iron deficiency anemia secondary to blood loss (chronic)   . Polyp of sigmoid colon   . Benign neoplasm of cecum   . Intestines neoplasm   . Iron deficiency anemia due to chronic blood loss   . Stricture and stenosis of esophagus   . Pre-diabetes 08/26/2018  . Advanced care planning/counseling discussion 07/22/2017  . Hypercholesteremia 07/03/2016  . Osteoporosis 07/03/2016  . Carcinoma of right breast ([place]) 06/19/2016  . Aortic stenosis, mild 05/02/2015  . Hypertension 05/02/2015  . Hypothyroid 05/02/2015  . Breast cancer in female [facility]) 05/02/2015    Plan    Will get a staging CT scan of chest,  abdomen and pelvis. Blood work of CEA, CBC and CMP. Plan for robotic right hemicolectomy. Risks and benefits of the above procedure been discussed in detail which include but not limited to  anesthesia, bleeding, infection, leak/obstruction, scarring etc.  I believe she understands these are not all inclusive and desires to proceed.  Questions elicited and answered.  No guarantees ever expressed or implied. We discussed her bowel prep and coordinating that with obtaining her imaging preoperatively.  Face-to-face time spent with the patient and accompanying care providers(if present) was 45 minutes, with more than 50% of the time spent counseling, educating, and coordinating care of the patient.      Ronny Bacon M.D., FACS 10/22/2019, 3:30 PM

## 2019-10-22 NOTE — Patient Instructions (Addendum)
Your CT Chest Abdomen and Pelvis with Contrast appointment is: 10/27/19 at [place] 12:45pm at the [facility]. Nothing to eat or drink 4 hours prior. Please pick up your contrast before appointment.   You will need to get Lab work completed. You do not need an appointment for this. You can get this done at the [facility] as well.  Our surgery scheduler will contact you to schedule your surgery. Please have the [facility] Sheet available when she calls you.   Once you have a surgery date you will need to pick up your prescriptions at your local pharmacy with a 64ounce  Bottle of gatorade. You will need to follow the instructions on your Miralax colon surgery Prep sheet that was given to you.   Contact the office if you have any questions or concerns.   Laparoscopic Colectomy Laparoscopic colectomy is surgery to remove part or all of the large intestine (colon). This procedure may be used to treat several conditions, including:  Inflammation and infection of the colon (diverticulitis).  Tumors or masses in the colon.  Inflammatory bowel disease, such as Crohn disease or ulcerative colitis. Colectomy is an option when symptoms cannot be controlled with medicines.  Bleeding from the colon that cannot be controlled by another method.  Blockage or obstruction of the colon. Tell a health care provider about:  Any allergies you have.  All medicines you are taking, including vitamins, herbs, eye drops, creams, and over-the-counter medicines.  Any problems you or family members have had with anesthetic medicines.  Any blood disorders you have.  Any surgeries you have had.  Any medical conditions you have. What are the risks? Generally, this is a safe procedure. However, problems may occur, including:  Infection.  Bleeding.  Allergic reactions to medicines or dyes.  Damage to other structures or organs.  Leaking from where the colon was sewn  together.  Future blockage of the small intestines from scar tissue. Another surgery may be needed to repair this.  Needing to convert to an open procedure. Complications such as damage to other organs or excessive bleeding may require the surgeon to convert from a laparoscopic procedure to an open procedure. This involves making a larger incision in the abdomen. What happens before the procedure? Staying hydrated Follow instructions from your health care provider about hydration, which may include:  Up to 2 hours before the procedure - you may continue to drink clear liquids, such as water, clear fruit juice, black coffee, and plain tea. Eating and drinking restrictions Follow instructions from your health care provider about eating and drinking, which may include:  8 hours before the procedure - stop eating heavy meals, meals with high fiber, or foods such as meat, fried foods, or fatty foods.  6 hours before the procedure - stop eating light meals or foods, such as toast or cereal.  6 hours before the procedure - stop drinking milk or drinks that contain milk.  2 hours before the procedure - stop drinking clear liquids. Medicines  Ask your health care provider about: ? Changing or stopping your regular medicines. This is especially important if you are taking diabetes medicines or blood thinners. ? Taking medicines such as aspirin and ibuprofen. These medicines can thin your blood. Do not take these medicines before your procedure if your health care provider instructs you not to.  You may be given antibiotic medicine to clean out bacteria from your colon. Follow the directions carefully and take the medicine  at the correct time. General instructions  You may be prescribed an oral bowel prep to clean out your colon in preparation for the surgery: ? Follow instructions from your health care provider about how to do this. ? Do not eat or drink anything else after you have started the  bowel prep, unless your health care provider tells you it is safe to do so.  Do not use any products that contain nicotine or tobacco, such as cigarettes and e-cigarettes. If you need help quitting, ask your health care provider. What happens during the procedure?  To reduce your risk of infection: ? Your health care team will wash or sanitize their hands. ? Your skin will be washed with soap.  An IV tube will be inserted into one of your veins to deliver fluid and medication.  You will be given one of the following: ? A medicine to help you relax (sedative). ? A medicine to make you fall asleep (general anesthetic).  Small monitors will be connected to your body. They will be used to check your heart, blood pressure, and oxygen level.  A breathing tube may be placed into your lungs during the procedure.  A thin, flexible tube (catheter) will be placed into your bladder to drain urine.  A tube may be placed through your nose and into your stomach to drain stomach fluids (nasogastric tube, or NG tube).  Your abdomen will be filled with air so it expands. This gives the surgeon more room to operate and makes your organs easier to see.  Several small cuts (incisions) will be made in your abdomen.  A thin, lighted tube with a tiny camera on the end (laparoscope) will be put through one of the small incisions. The camera on the laparoscope will send a picture to a computer screen in the operating room. This will give the surgeon a good view inside your abdomen.  Hollow tubes will be put through the other small incisions in your abdomen. The tools that are needed for the procedure will be put through these tubes.  Clamps or staples will be put on both ends of the diseased part of the colon.  The part of the intestine between the clamps or staples will be removed.  If possible, the ends of the healthy colon that remain will be stitched (sutured) or stapled together to allow your body to  pass waste (stool).  Sometimes, the remaining colon cannot be stitched back together. If this is the case, a colostomy will be needed. If you need a colostomy: ? An opening to the outside of your body (stoma) will be made through your abdomen. ? The end of your colon will be brought to the opening. It will be stitched to the skin. ? A bag will be attached to the opening. Stool will drain into this removable bag. ? The colostomy may be temporary or permanent.  The incisions from the colectomy will be closed with sutures or staples. The procedure may vary among health care providers and hospitals. What happens after the procedure?  Your blood pressure, heart rate, breathing rate, and blood oxygen level will be monitored until the medicines you were given have worn off.  You will receive fluids through an IV tube until your bowels start to work properly.  Once your bowels are working again, you will be given clear liquids first and then solid food as tolerated.  You will be given medicines to control your pain and nausea, if needed.  Do not drive for 24 hours if you were given a sedative. This information is not intended to replace advice given to you by your health care provider. Make sure you discuss any questions you have with your health care provider. Document Revised: 05/17/2017 Document Reviewed: 03/05/2016 Elsevier Patient Education  2020 [employer].

## 2019-10-23 LAB — CEA: CEA: 1.4 ng/mL (ref 0.0–4.7)

## 2019-10-26 ENCOUNTER — Other Ambulatory Visit
Admission: RE | Admit: 2019-10-26 | Discharge: 2019-10-26 | Disposition: A | Discharge status: Home / Self Care | Payer: Medicare Other | Source: Ambulatory Visit | Attending: Surgery | Admitting: Surgery

## 2019-10-26 ENCOUNTER — Telehealth: Payer: Self-pay | Admitting: Surgery

## 2019-10-26 DIAGNOSIS — Z01812 Encounter for preprocedural laboratory examination: Secondary | ICD-10-CM | POA: Insufficient documentation

## 2019-10-26 DIAGNOSIS — Z20822 Contact with and (suspected) exposure to covid-19: Secondary | ICD-10-CM | POA: Insufficient documentation

## 2019-10-26 NOTE — Telephone Encounter (Signed)
Patient called back & is aware of all surgery scheduling info.  She will head to [facility] now to complete her COVID testing & will call tomorrow to find out her arrival time for surgery on 10/28/19.

## 2019-10-26 NOTE — Telephone Encounter (Signed)
Outbound call made to the pt & message left requesting she call back.  Another call was made to [facility] (daugher) asking she contact the pt & ask she call the office as quickly as she can to obtain important info.  When the pt calls back, please advise her of the following Pre-Admission date/time, COVID Testing date and Surgery date.  Surgery Date: 10/28/19 Preadmission Testing Date: 1.5 hrs prior on day of sx  (in person on 05/12 @ 6 am) Covid Testing Date: 10/26/19 (TODAY) - patient advised to go to the [place] ([place] between 8a-1p  Patient also needs to be made aware to call [phone], between 1-3:00pm the day before surgery, to find out what time to arrive for surgery.

## 2019-10-27 ENCOUNTER — Ambulatory Visit: Payer: Self-pay | Admitting: Surgery

## 2019-10-27 ENCOUNTER — Other Ambulatory Visit: Payer: Self-pay

## 2019-10-27 ENCOUNTER — Ambulatory Visit
Admission: RE | Admit: 2019-10-27 | Discharge: 2019-10-27 | Disposition: A | Discharge status: Home / Self Care | Payer: Medicare Other | Source: Ambulatory Visit | Attending: Surgery | Admitting: Surgery

## 2019-10-27 DIAGNOSIS — C183 Malignant neoplasm of hepatic flexure: Secondary | ICD-10-CM | POA: Diagnosis not present

## 2019-10-27 DIAGNOSIS — C19 Malignant neoplasm of rectosigmoid junction: Secondary | ICD-10-CM

## 2019-10-27 LAB — SARS CORONAVIRUS 2 (TAT 6-24 HRS): SARS Coronavirus 2: NEGATIVE

## 2019-10-27 IMAGING — CT CT CHEST W/ CM
2 of 5 series · 12 of 36 positions shown, 15 images · IV contrast (omnipaque)
Comparison: Chest CT from [DATE]

CLINICAL DATA: Staging of hepatic flexure colon adenocarcinoma.
History of right breast cancer with lumpectomy 12 years ago.

EXAM:
CT CHEST, ABDOMEN, AND PELVIS WITH CONTRAST
TECHNIQUE: Multidetector CT imaging of the chest, abdomen and pelvis was
performed following the standard protocol during bolus
administration of intravenous contrast.
CONTRAST:  100mL OMNIPAQUE IOHEXOL 300 MG/ML  SOLN

[Series 2: cap with · axial · 0.82mm/px · z∈[-629,-114]mm · 9 of 129 slices shown, 12 images]
[im 13/129  mediastinal]
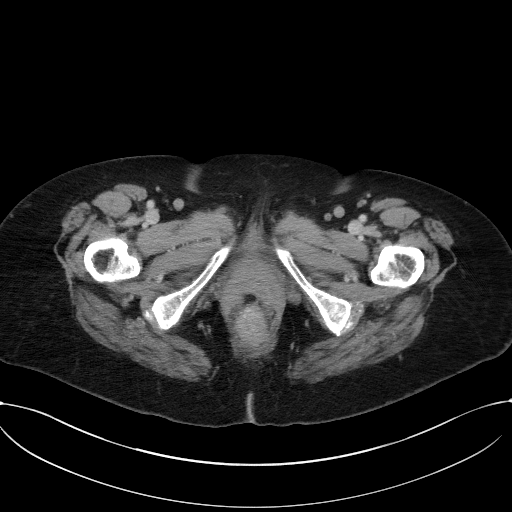
[im 13/129  lung]
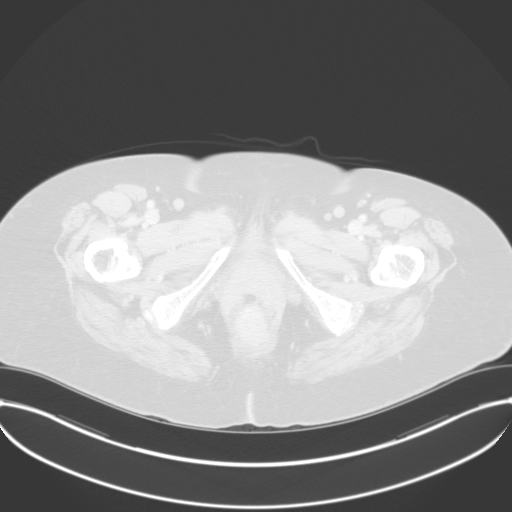
[im 26/129  lung]
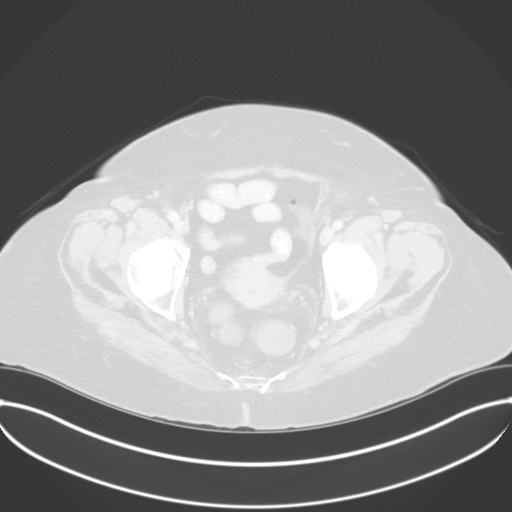
[im 39/129  lung]
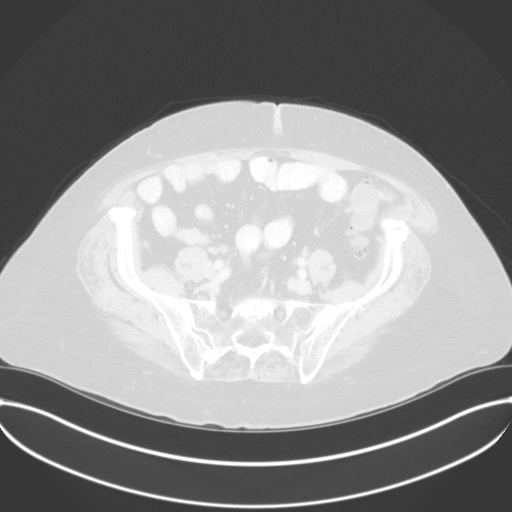
[im 52/129  lung]
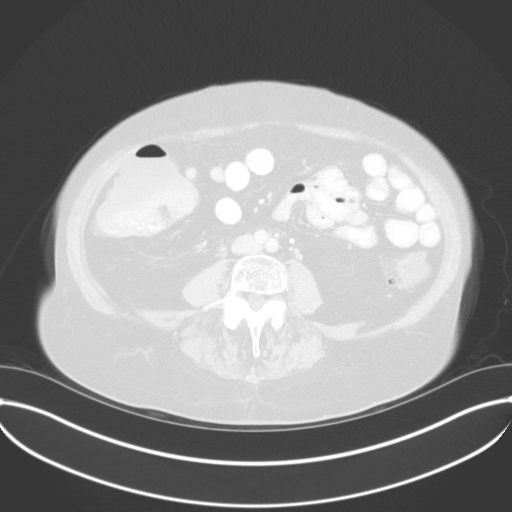
[im 65/129  mediastinal]
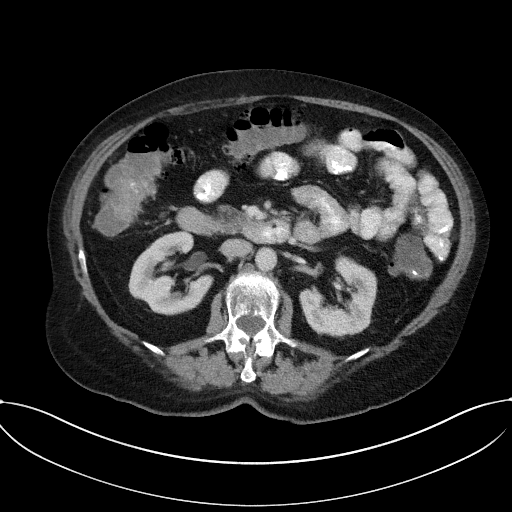
[im 65/129  lung]
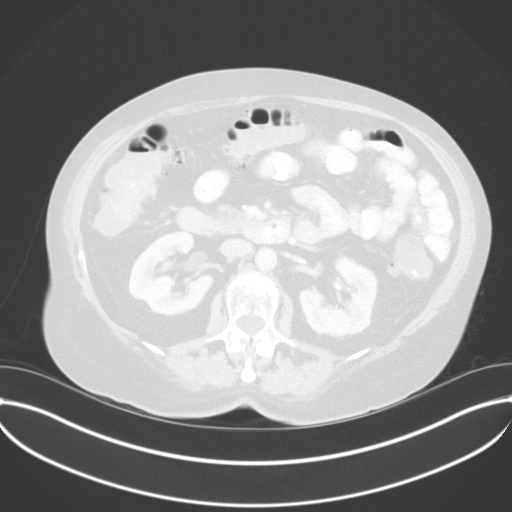
[im 77/129  lung]
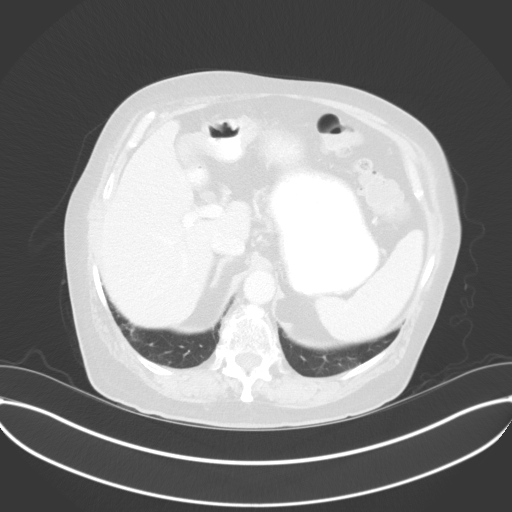
[im 90/129  lung]
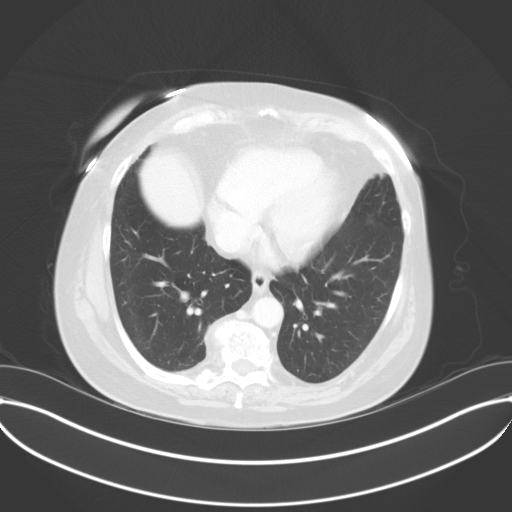
[im 103/129  lung]
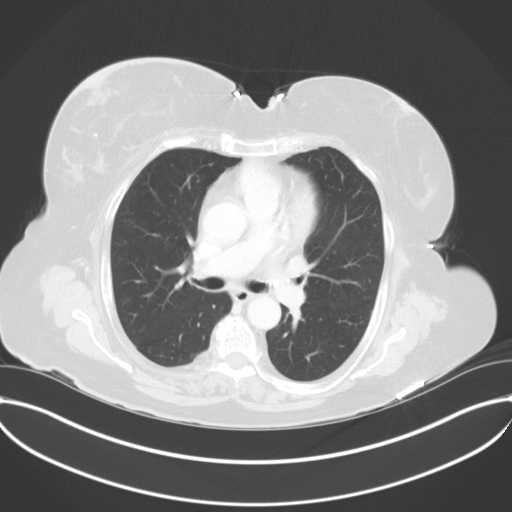
[im 116/129  mediastinal]
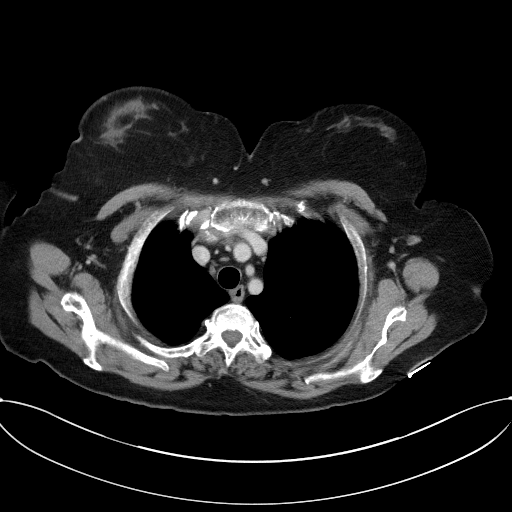
[im 116/129  lung]
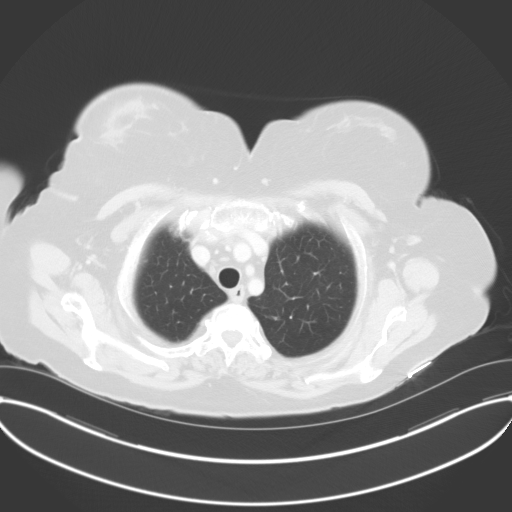

[Series 6: coronals · coronal · 0.88mm/px · 3 of 153 slices shown]
[im 31/153  lung]
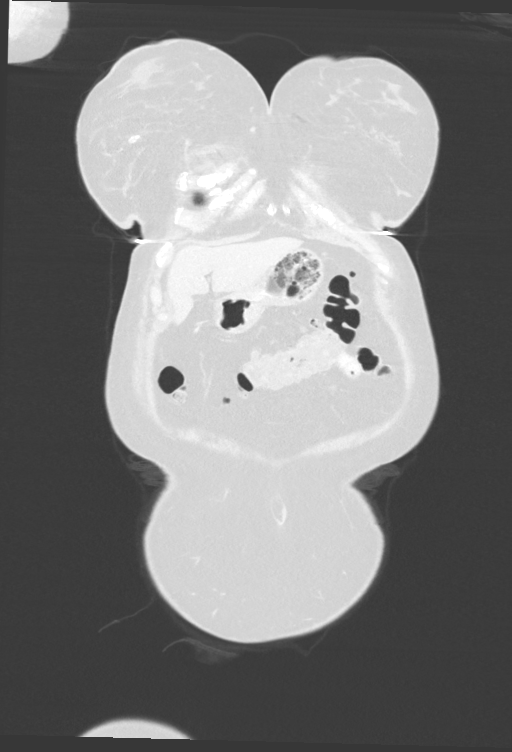
[im 61/153  lung]
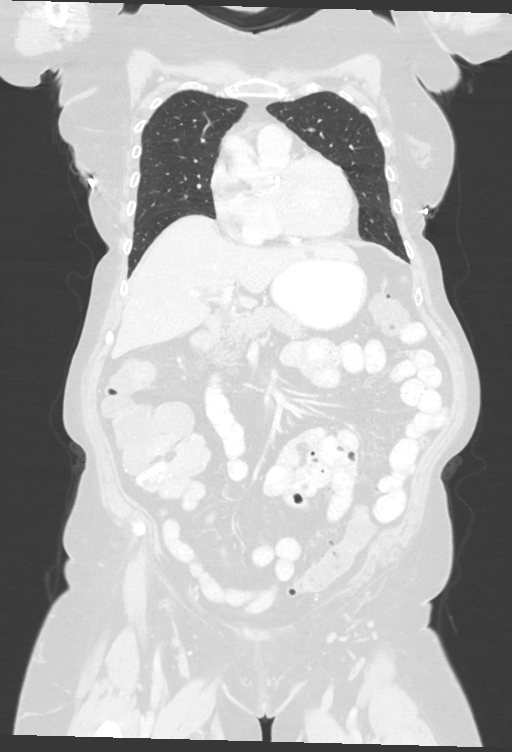
[im 92/153  lung]
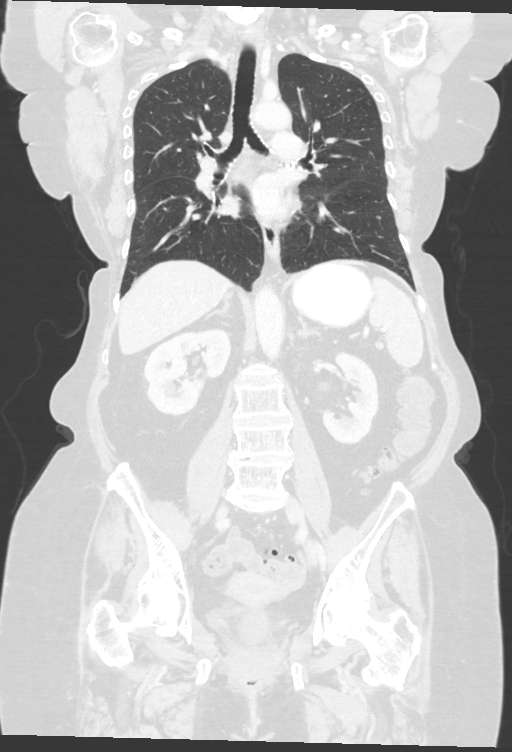

[12 of 36 positions shown; findings below may reference images not displayed]

FINDINGS: CT CHEST FINDINGS

Cardiovascular: Aortic valve calcification. Mild atherosclerotic
calcification of the aortic arch.

Mediastinum/Nodes: Unremarkable

Lungs/Pleura: A 3 mm left lower lobe pulmonary nodule on image 109/4
was probably present on [DATE] although somewhat blurred by
motion artifact on that exam. Likewise a 2 mm left lower lobe nodule
on image 89/4 was probably present on [DATE] although slightly
more indistinct.

Musculoskeletal: Degenerative glenohumeral arthropathy bilaterally.
Chondrocalcinosis in the left glenohumeral joint. Thoracic
spondylosis.

CT ABDOMEN PELVIS FINDINGS

Hepatobiliary: 1.2 by 0.9 cm hypodense lesion in segment 2 of the
liver on image 44/2 is stable from [DATE] and highly likely to
be a benign lesion such as cyst. A 0.7 cm hypodense lesion
inferiorly in the right hepatic lobe on image 58/2 is technically
too small to characterize. There is previously a larger cyst in the
right hepatic lobe near the base of the caudate lobe on the [TR]
exam, this appears to have resolved.

Gallbladder absent.

Pancreas: Unremarkable

Spleen: Unremarkable

Adrenals/Urinary Tract: Unremarkable

Stomach/Bowel: Transverse, descending, and sigmoid colon
diverticulosis. The hepatic flexure mass is difficult to perceive
due to a similar density of contrast in the colon, but is thought to
be present in the vicinity of image 66/2. Regional paracolic lymph
nodes measure up to 7 mm in diameter.

Vascular/Lymphatic: Aortoiliac atherosclerotic vascular disease.

Reproductive: Unremarkable

Other: No supplemental non-categorized findings.

Musculoskeletal: Bilateral degenerative hip arthropathy with mild
chondrocalcinosis.
IMPRESSION: 1. The hepatic flexure mass is difficult to perceive due to a
similar density of contrast in the colon, but is thought to be
present in the vicinity of image 66/2. Regional paracolic lymph
nodes measure up to 7 mm in diameter. No compelling findings of
distant metastatic disease.
2. Two tiny pulmonary nodules in the left lower lobe are probably
present on [DATE] although slightly more indistinct on today's
exam. These are probably benign but merit surveillance.
3. Stable cyst in segment 2 of the liver. A 0.7 cm hypodense lesion
inferiorly in the right hepatic lobe due to small size.
4. Other imaging findings of potential clinical significance: Aortic
valve calcification. Degenerative glenohumeral arthropathy
bilaterally with mild chondrocalcinosis in the left glenohumeral
joint. Transverse, descending, and sigmoid colon diverticulosis.
5. Aortic atherosclerosis.

Aortic Atherosclerosis ([TR]-[TR]).

## 2019-10-27 MED ORDER — SODIUM CHLORIDE 0.9 % IV SOLN
2.0000 g | INTRAVENOUS | Status: AC
  Administered 2019-10-28: 2 g via INTRAVENOUS

## 2019-10-27 MED ORDER — IOHEXOL 300 MG/ML  SOLN
100.0000 mL | Freq: Once | INTRAMUSCULAR | Status: AC | PRN
  Administered 2019-10-27: 100 mL via INTRAVENOUS

## 2019-10-28 ENCOUNTER — Inpatient Hospital Stay
Admission: RE | Admit: 2019-10-28 | Discharge: 2019-11-02 | DRG: 330 | Disposition: A | Discharge status: Home / Self Care | Payer: Medicare Other | Attending: Surgery | Admitting: Surgery

## 2019-10-28 ENCOUNTER — Inpatient Hospital Stay: Payer: Medicare Other

## 2019-10-28 ENCOUNTER — Encounter: Payer: Self-pay | Admitting: Surgery

## 2019-10-28 ENCOUNTER — Inpatient Hospital Stay: Payer: Medicare Other | Admitting: Anesthesiology

## 2019-10-28 ENCOUNTER — Encounter
Admission: RE | Disposition: A | Discharge status: Home / Self Care | Payer: Self-pay | Source: Home / Self Care | Attending: Surgery

## 2019-10-28 ENCOUNTER — Other Ambulatory Visit: Payer: Self-pay

## 2019-10-28 DIAGNOSIS — Z9221 Personal history of antineoplastic chemotherapy: Secondary | ICD-10-CM | POA: Diagnosis not present

## 2019-10-28 DIAGNOSIS — Z7982 Long term (current) use of aspirin: Secondary | ICD-10-CM

## 2019-10-28 DIAGNOSIS — C772 Secondary and unspecified malignant neoplasm of intra-abdominal lymph nodes: Secondary | ICD-10-CM | POA: Diagnosis present

## 2019-10-28 DIAGNOSIS — C189 Malignant neoplasm of colon, unspecified: Secondary | ICD-10-CM | POA: Diagnosis not present

## 2019-10-28 DIAGNOSIS — E8770 Fluid overload, unspecified: Secondary | ICD-10-CM

## 2019-10-28 DIAGNOSIS — Z79899 Other long term (current) drug therapy: Secondary | ICD-10-CM | POA: Diagnosis not present

## 2019-10-28 DIAGNOSIS — E039 Hypothyroidism, unspecified: Secondary | ICD-10-CM | POA: Diagnosis present

## 2019-10-28 DIAGNOSIS — C182 Malignant neoplasm of ascending colon: Secondary | ICD-10-CM | POA: Diagnosis present

## 2019-10-28 DIAGNOSIS — Z20822 Contact with and (suspected) exposure to covid-19: Secondary | ICD-10-CM | POA: Diagnosis present

## 2019-10-28 DIAGNOSIS — K567 Ileus, unspecified: Secondary | ICD-10-CM | POA: Diagnosis not present

## 2019-10-28 DIAGNOSIS — Z85828 Personal history of other malignant neoplasm of skin: Secondary | ICD-10-CM

## 2019-10-28 DIAGNOSIS — Z79890 Hormone replacement therapy (postmenopausal): Secondary | ICD-10-CM | POA: Diagnosis not present

## 2019-10-28 DIAGNOSIS — Z853 Personal history of malignant neoplasm of breast: Secondary | ICD-10-CM | POA: Diagnosis not present

## 2019-10-28 DIAGNOSIS — M8580 Other specified disorders of bone density and structure, unspecified site: Secondary | ICD-10-CM | POA: Diagnosis present

## 2019-10-28 DIAGNOSIS — K6389 Other specified diseases of intestine: Secondary | ICD-10-CM | POA: Diagnosis not present

## 2019-10-28 DIAGNOSIS — M1990 Unspecified osteoarthritis, unspecified site: Secondary | ICD-10-CM | POA: Diagnosis present

## 2019-10-28 DIAGNOSIS — I10 Essential (primary) hypertension: Secondary | ICD-10-CM | POA: Diagnosis present

## 2019-10-28 DIAGNOSIS — E876 Hypokalemia: Secondary | ICD-10-CM | POA: Diagnosis present

## 2019-10-28 DIAGNOSIS — C19 Malignant neoplasm of rectosigmoid junction: Secondary | ICD-10-CM

## 2019-10-28 DIAGNOSIS — Z923 Personal history of irradiation: Secondary | ICD-10-CM | POA: Diagnosis not present

## 2019-10-28 DIAGNOSIS — Z85038 Personal history of other malignant neoplasm of large intestine: Secondary | ICD-10-CM | POA: Diagnosis present

## 2019-10-28 DIAGNOSIS — C183 Malignant neoplasm of hepatic flexure: Principal | ICD-10-CM | POA: Diagnosis present

## 2019-10-28 LAB — CBC
HCT: 33.9 % — ABNORMAL LOW (ref 36.0–46.0)
Hemoglobin: 11.6 g/dL — ABNORMAL LOW (ref 12.0–15.0)
MCH: 30.2 pg (ref 26.0–34.0)
MCHC: 34.2 g/dL (ref 30.0–36.0)
MCV: 88.3 fL (ref 80.0–100.0)
Platelets: 212 10*3/uL (ref 150–400)
RBC: 3.84 MIL/uL — ABNORMAL LOW (ref 3.87–5.11)
RDW: 14.5 % (ref 11.5–15.5)
WBC: 11.6 10*3/uL — ABNORMAL HIGH (ref 4.0–10.5)
nRBC: 0 % (ref 0.0–0.2)

## 2019-10-28 LAB — TYPE AND SCREEN
ABO/RH(D): O NEG
Antibody Screen: NEGATIVE

## 2019-10-28 LAB — ABO/RH: ABO/RH(D): O NEG

## 2019-10-28 IMAGING — DX DG CHEST 1V
1 series · 1 of 1 positions shown · non-contrast
Comparison: Chest radiograph [DATE], CT chest abdomen pelvis
[DATE]

CLINICAL DATA: Fluid overload, history breast cancer, hypertension

EXAM:
CHEST  1 VIEW

[chest ap]
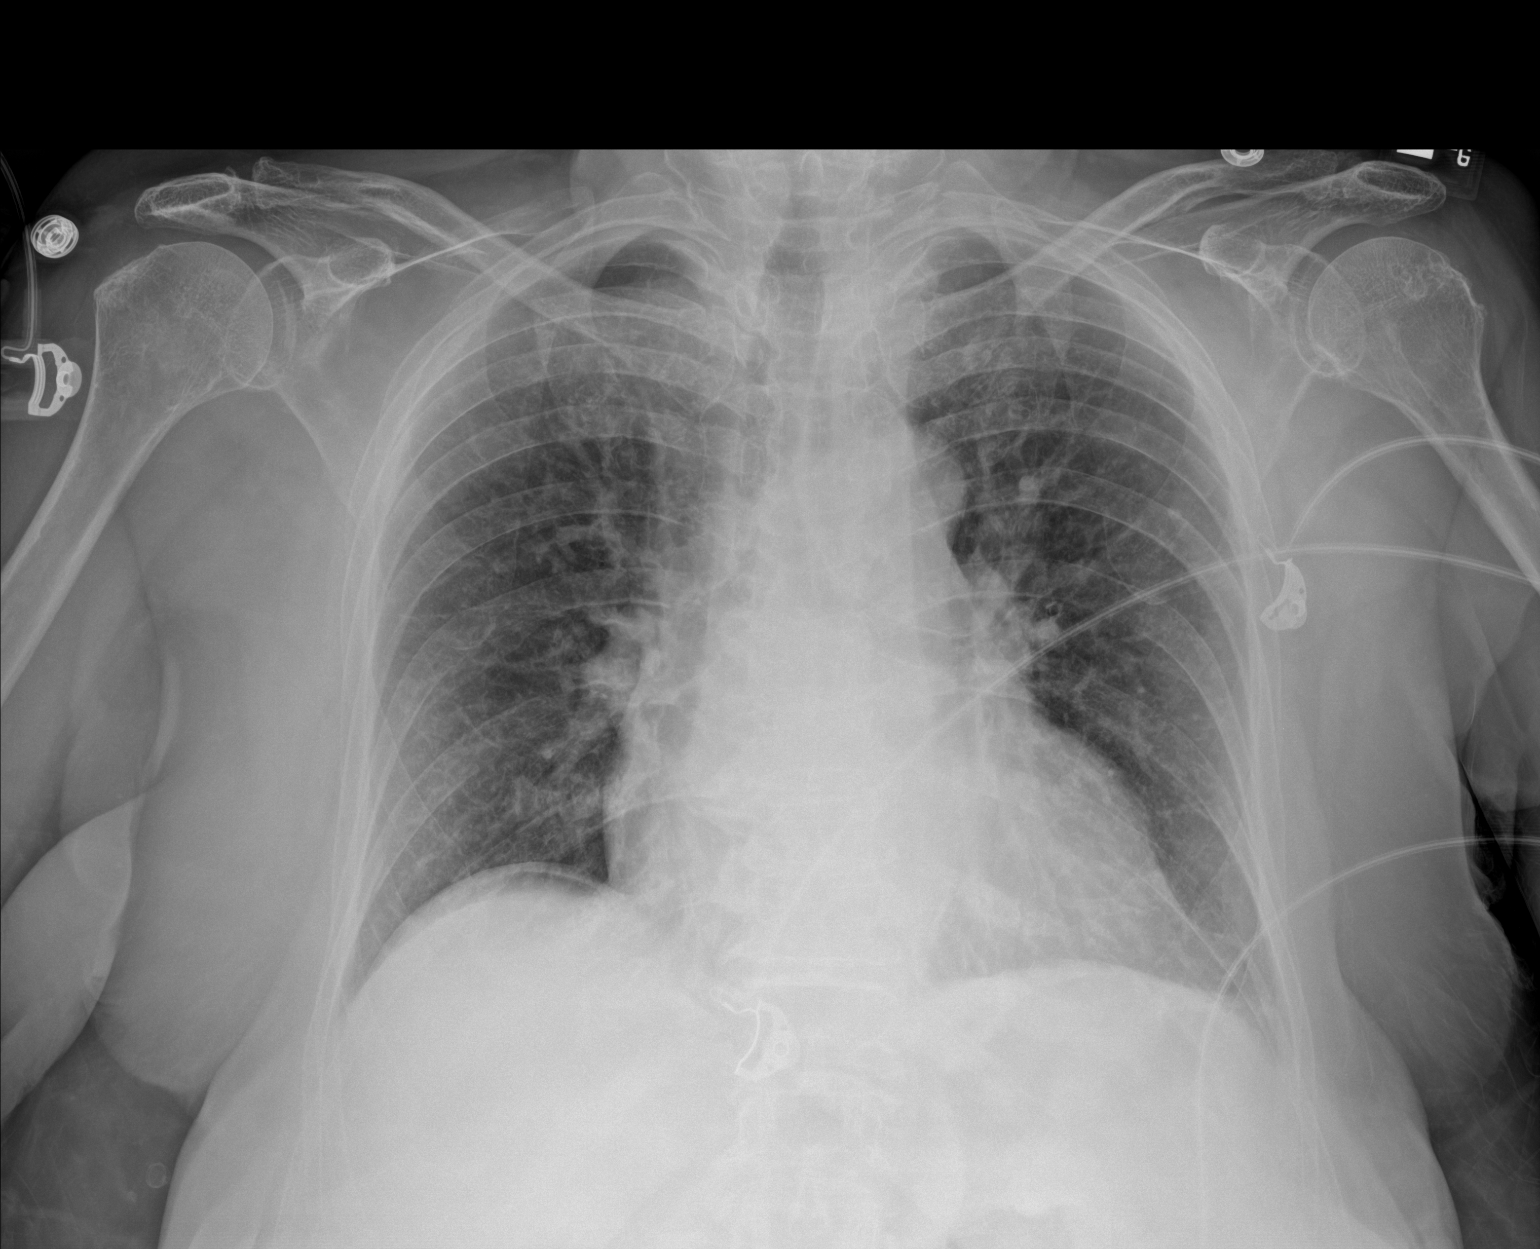

[1 of 1 positions shown; findings below may reference images not displayed]

FINDINGS: Upper normal size of cardiac silhouette.

Mediastinal contours and pulmonary vascularity normal.

Lungs clear.

No pulmonary infiltrate, pleural effusion or pneumothorax.

Bones demineralized.

Free air into the RIGHT hemidiaphragm, patient post robot assisted
laparoscopic partial colectomy today.
IMPRESSION: No acute abnormalities.

## 2019-10-28 SURGERY — COLECTOMY, PARTIAL, ROBOT-ASSISTED, LAPAROSCOPIC
Anesthesia: General

## 2019-10-28 MED ORDER — ENOXAPARIN SODIUM 40 MG/0.4ML ~~LOC~~ SOLN
40.0000 mg | SUBCUTANEOUS | Status: DC
  Administered 2019-10-29 – 2019-11-02 (×5): 40 mg via SUBCUTANEOUS
  Filled 2019-10-28 (×5): qty 0.4

## 2019-10-28 MED ORDER — FAMOTIDINE 20 MG PO TABS
ORAL_TABLET | ORAL | Status: AC
  Administered 2019-10-28: 20 mg via ORAL
  Filled 2019-10-28: qty 1

## 2019-10-28 MED ORDER — CHLORHEXIDINE GLUCONATE CLOTH 2 % EX PADS
6.0000 | MEDICATED_PAD | Freq: Once | CUTANEOUS | Status: AC
  Administered 2019-10-28: 6 via TOPICAL

## 2019-10-28 MED ORDER — ENOXAPARIN SODIUM 40 MG/0.4ML ~~LOC~~ SOLN: 40.0000 mg | Freq: Once | SUBCUTANEOUS | Status: AC

## 2019-10-28 MED ORDER — BUPIVACAINE LIPOSOME 1.3 % IJ SUSP
INTRAMUSCULAR | Status: DC | PRN
  Administered 2019-10-28: 20 mL

## 2019-10-28 MED ORDER — EPHEDRINE SULFATE 50 MG/ML IJ SOLN
INTRAMUSCULAR | Status: DC | PRN
  Administered 2019-10-28: 10 mg via INTRAVENOUS

## 2019-10-28 MED ORDER — PHENYLEPHRINE HCL (PRESSORS) 10 MG/ML IV SOLN
INTRAVENOUS | Status: AC
  Filled 2019-10-28: qty 1

## 2019-10-28 MED ORDER — SODIUM CHLORIDE 0.9 % IV SOLN
INTRAVENOUS | Status: DC | PRN
  Administered 2019-10-28: 200 ug via INTRAVENOUS
  Administered 2019-10-28: 100 ug via INTRAVENOUS
  Administered 2019-10-28: 25 ug/min via INTRAVENOUS

## 2019-10-28 MED ORDER — ONDANSETRON HCL 4 MG/2ML IJ SOLN
INTRAMUSCULAR | Status: DC | PRN
  Administered 2019-10-28: 4 mg via INTRAVENOUS

## 2019-10-28 MED ORDER — ACETAMINOPHEN 500 MG PO TABS: 1000.0000 mg | ORAL_TABLET | ORAL | Status: AC

## 2019-10-28 MED ORDER — BUPIVACAINE LIPOSOME 1.3 % IJ SUSP: 20.0000 mL | Freq: Once | INTRAMUSCULAR | Status: DC

## 2019-10-28 MED ORDER — FENTANYL CITRATE (PF) 100 MCG/2ML IJ SOLN: 25.0000 ug | INTRAMUSCULAR | Status: DC | PRN

## 2019-10-28 MED ORDER — ROCURONIUM BROMIDE 100 MG/10ML IV SOLN
INTRAVENOUS | Status: DC | PRN
  Administered 2019-10-28: 30 mg via INTRAVENOUS
  Administered 2019-10-28: 10 mg via INTRAVENOUS
  Administered 2019-10-28: 100 mg via INTRAVENOUS

## 2019-10-28 MED ORDER — ALVIMOPAN 12 MG PO CAPS: 12.0000 mg | ORAL_CAPSULE | ORAL | Status: AC

## 2019-10-28 MED ORDER — ENOXAPARIN SODIUM 40 MG/0.4ML ~~LOC~~ SOLN
SUBCUTANEOUS | Status: AC
  Administered 2019-10-28: 40 mg via SUBCUTANEOUS
  Filled 2019-10-28: qty 0.4

## 2019-10-28 MED ORDER — AMLODIPINE BESYLATE 5 MG PO TABS
5.0000 mg | ORAL_TABLET | Freq: Every day | ORAL | Status: DC
  Administered 2019-10-29 – 2019-11-02 (×5): 5 mg via ORAL
  Filled 2019-10-28 (×5): qty 1

## 2019-10-28 MED ORDER — SUGAMMADEX SODIUM 200 MG/2ML IV SOLN
INTRAVENOUS | Status: DC | PRN
  Administered 2019-10-28: 200 mg via INTRAVENOUS

## 2019-10-28 MED ORDER — EPHEDRINE 5 MG/ML INJ
INTRAVENOUS | Status: AC
  Filled 2019-10-28: qty 10

## 2019-10-28 MED ORDER — FAMOTIDINE 20 MG PO TABS: 20.0000 mg | ORAL_TABLET | Freq: Once | ORAL | Status: AC

## 2019-10-28 MED ORDER — ACETAMINOPHEN 500 MG PO TABS
ORAL_TABLET | ORAL | Status: AC
  Administered 2019-10-28: 1000 mg via ORAL
  Filled 2019-10-28: qty 2

## 2019-10-28 MED ORDER — LEVOTHYROXINE SODIUM 50 MCG PO TABS
25.0000 ug | ORAL_TABLET | Freq: Every day | ORAL | Status: DC
  Administered 2019-10-29: 25 ug via ORAL
  Filled 2019-10-28: qty 1

## 2019-10-28 MED ORDER — MIDAZOLAM HCL 2 MG/2ML IJ SOLN
INTRAMUSCULAR | Status: DC | PRN
  Administered 2019-10-28: 2 mg via INTRAVENOUS

## 2019-10-28 MED ORDER — CELECOXIB 200 MG PO CAPS
200.0000 mg | ORAL_CAPSULE | Freq: Two times a day (BID) | ORAL | Status: DC
  Administered 2019-10-28 – 2019-11-02 (×9): 200 mg via ORAL
  Filled 2019-10-28 (×12): qty 1

## 2019-10-28 MED ORDER — GABAPENTIN 300 MG PO CAPS
300.0000 mg | ORAL_CAPSULE | Freq: Two times a day (BID) | ORAL | Status: DC
  Administered 2019-10-28 – 2019-11-02 (×10): 300 mg via ORAL
  Filled 2019-10-28 (×10): qty 1

## 2019-10-28 MED ORDER — FENTANYL CITRATE (PF) 250 MCG/5ML IJ SOLN
INTRAMUSCULAR | Status: AC
  Filled 2019-10-28: qty 5

## 2019-10-28 MED ORDER — GABAPENTIN 300 MG PO CAPS
ORAL_CAPSULE | ORAL | Status: AC
  Administered 2019-10-28: 300 mg via ORAL
  Filled 2019-10-28: qty 1

## 2019-10-28 MED ORDER — LACTATED RINGERS IV SOLN: INTRAVENOUS | Status: DC

## 2019-10-28 MED ORDER — ACETAMINOPHEN 10 MG/ML IV SOLN
INTRAVENOUS | Status: DC | PRN
  Administered 2019-10-28: 1000 mg via INTRAVENOUS

## 2019-10-28 MED ORDER — SODIUM CHLORIDE 0.9 % IV SOLN
INTRAVENOUS | Status: AC
  Filled 2019-10-28: qty 2

## 2019-10-28 MED ORDER — MIDAZOLAM HCL 2 MG/2ML IJ SOLN
INTRAMUSCULAR | Status: AC
  Filled 2019-10-28: qty 2

## 2019-10-28 MED ORDER — ALVIMOPAN 12 MG PO CAPS
ORAL_CAPSULE | ORAL | Status: AC
  Administered 2019-10-28: 12 mg via ORAL
  Filled 2019-10-28: qty 1

## 2019-10-28 MED ORDER — OXYCODONE HCL 5 MG PO TABS: 5.0000 mg | ORAL_TABLET | ORAL | Status: DC | PRN

## 2019-10-28 MED ORDER — DIPHENHYDRAMINE HCL 12.5 MG/5ML PO ELIX
12.5000 mg | ORAL_SOLUTION | Freq: Four times a day (QID) | ORAL | Status: DC | PRN
  Filled 2019-10-28: qty 5

## 2019-10-28 MED ORDER — SODIUM CHLORIDE 0.9 % IV SOLN
2.0000 g | Freq: Two times a day (BID) | INTRAVENOUS | Status: AC
  Administered 2019-10-28: 2 g via INTRAVENOUS
  Filled 2019-10-28: qty 2

## 2019-10-28 MED ORDER — EPINEPHRINE PF 1 MG/ML IJ SOLN
INTRAMUSCULAR | Status: AC
  Filled 2019-10-28: qty 1

## 2019-10-28 MED ORDER — KETAMINE HCL 50 MG/ML IJ SOLN
INTRAMUSCULAR | Status: DC | PRN
  Administered 2019-10-28: 25 mg via INTRAMUSCULAR

## 2019-10-28 MED ORDER — PROPOFOL 10 MG/ML IV BOLUS
INTRAVENOUS | Status: DC | PRN
  Administered 2019-10-28: 100 mg via INTRAVENOUS

## 2019-10-28 MED ORDER — MORPHINE SULFATE (PF) 2 MG/ML IV SOLN: 2.0000 mg | INTRAVENOUS | Status: DC | PRN

## 2019-10-28 MED ORDER — BENAZEPRIL HCL 20 MG PO TABS
40.0000 mg | ORAL_TABLET | Freq: Every day | ORAL | Status: DC
  Administered 2019-10-29 – 2019-11-01 (×4): 40 mg via ORAL
  Filled 2019-10-28 (×6): qty 2

## 2019-10-28 MED ORDER — FENTANYL CITRATE (PF) 100 MCG/2ML IJ SOLN
INTRAMUSCULAR | Status: DC | PRN
  Administered 2019-10-28: 50 ug via INTRAVENOUS

## 2019-10-28 MED ORDER — SODIUM CHLORIDE 0.9 % IV SOLN
INTRAVENOUS | Status: DC
  Filled 2019-10-28: qty 6

## 2019-10-28 MED ORDER — METOPROLOL SUCCINATE ER 50 MG PO TB24
50.0000 mg | ORAL_TABLET | Freq: Every day | ORAL | Status: DC
  Administered 2019-10-29 – 2019-11-02 (×5): 50 mg via ORAL
  Filled 2019-10-28 (×5): qty 1

## 2019-10-28 MED ORDER — KETAMINE HCL 50 MG/ML IJ SOLN
INTRAMUSCULAR | Status: AC
  Filled 2019-10-28: qty 10

## 2019-10-28 MED ORDER — LACTATED RINGERS IV SOLN: INTRAVENOUS | Status: DC | PRN

## 2019-10-28 MED ORDER — ZOLPIDEM TARTRATE 5 MG PO TABS: 5.0000 mg | ORAL_TABLET | Freq: Every evening | ORAL | Status: DC | PRN

## 2019-10-28 MED ORDER — ONDANSETRON 4 MG PO TBDP: 4.0000 mg | ORAL_TABLET | Freq: Four times a day (QID) | ORAL | Status: DC | PRN

## 2019-10-28 MED ORDER — GABAPENTIN 300 MG PO CAPS: 300.0000 mg | ORAL_CAPSULE | ORAL | Status: AC

## 2019-10-28 MED ORDER — BUPIVACAINE HCL (PF) 0.25 % IJ SOLN
INTRAMUSCULAR | Status: AC
  Filled 2019-10-28: qty 30

## 2019-10-28 MED ORDER — BUPIVACAINE-EPINEPHRINE (PF) 0.25% -1:200000 IJ SOLN
INTRAMUSCULAR | Status: DC | PRN
  Administered 2019-10-28: 30 mL

## 2019-10-28 MED ORDER — DEXAMETHASONE SODIUM PHOSPHATE 10 MG/ML IJ SOLN
INTRAMUSCULAR | Status: DC | PRN
  Administered 2019-10-28: 10 mg via INTRAVENOUS

## 2019-10-28 MED ORDER — PROPOFOL 500 MG/50ML IV EMUL
INTRAVENOUS | Status: AC
  Filled 2019-10-28: qty 50

## 2019-10-28 MED ORDER — ACETAMINOPHEN 500 MG PO TABS
1000.0000 mg | ORAL_TABLET | Freq: Four times a day (QID) | ORAL | Status: DC
  Administered 2019-10-28 – 2019-11-02 (×18): 1000 mg via ORAL
  Filled 2019-10-28 (×19): qty 2

## 2019-10-28 MED ORDER — SILVER SULFADIAZINE 1 % EX CREA
1.0000 "application " | TOPICAL_CREAM | Freq: Every day | CUTANEOUS | Status: DC | PRN
  Filled 2019-10-28: qty 85

## 2019-10-28 MED ORDER — LACTATED RINGERS IV BOLUS
500.0000 mL | Freq: Once | INTRAVENOUS | Status: AC
  Administered 2019-10-28: 500 mL via INTRAVENOUS

## 2019-10-28 MED ORDER — CHLORHEXIDINE GLUCONATE CLOTH 2 % EX PADS: 6.0000 | MEDICATED_PAD | Freq: Once | CUTANEOUS | Status: DC

## 2019-10-28 MED ORDER — BUPIVACAINE LIPOSOME 1.3 % IJ SUSP
INTRAMUSCULAR | Status: AC
  Filled 2019-10-28: qty 20

## 2019-10-28 MED ORDER — ALVIMOPAN 12 MG PO CAPS
12.0000 mg | ORAL_CAPSULE | Freq: Two times a day (BID) | ORAL | Status: DC
  Administered 2019-10-29: 12 mg via ORAL
  Filled 2019-10-28: qty 1

## 2019-10-28 MED ORDER — CHLORHEXIDINE GLUCONATE CLOTH 2 % EX PADS: 6.0000 | MEDICATED_PAD | Freq: Every day | CUTANEOUS | Status: DC

## 2019-10-28 MED ORDER — INDOCYANINE GREEN 25 MG IV SOLR
INTRAVENOUS | Status: DC | PRN
  Administered 2019-10-28 (×3): 2.5 mg via INTRAVENOUS

## 2019-10-28 MED ORDER — LIDOCAINE 20MG/ML (2%) 15 ML SYRINGE OPTIME
INTRAMUSCULAR | Status: DC | PRN
  Administered 2019-10-28: 1.527 mg/kg/h via INTRAVENOUS

## 2019-10-28 MED ORDER — DIPHENHYDRAMINE HCL 50 MG/ML IJ SOLN: 12.5000 mg | Freq: Four times a day (QID) | INTRAMUSCULAR | Status: DC | PRN

## 2019-10-28 MED ORDER — ONDANSETRON HCL 4 MG/2ML IJ SOLN: 4.0000 mg | Freq: Once | INTRAMUSCULAR | Status: DC | PRN

## 2019-10-28 MED ORDER — PANTOPRAZOLE SODIUM 40 MG IV SOLR
40.0000 mg | Freq: Every day | INTRAVENOUS | Status: DC
  Administered 2019-10-28 – 2019-11-01 (×5): 40 mg via INTRAVENOUS
  Filled 2019-10-28 (×5): qty 40

## 2019-10-28 MED ORDER — ONDANSETRON HCL 4 MG/2ML IJ SOLN
4.0000 mg | Freq: Four times a day (QID) | INTRAMUSCULAR | Status: DC | PRN
  Administered 2019-10-29 – 2019-10-31 (×2): 4 mg via INTRAVENOUS
  Filled 2019-10-28 (×2): qty 2

## 2019-10-28 SURGICAL SUPPLY — 84 items
ADH SKN CLS APL DERMABOND .7 (GAUZE/BANDAGES/DRESSINGS) ×2
ANCHOR TIS RET SYS 1550ML (BAG) IMPLANT
APL PRP STRL LF DISP 70% ISPRP (MISCELLANEOUS) ×2
BAG SPEC RTRVL C1550 25.4 (BAG)
BINDER ABDOMINAL 12 ML 46-62 (SOFTGOODS) ×4 IMPLANT
BLADE SURG SZ10 CARB STEEL (BLADE) IMPLANT
CANISTER SUCT 1200ML W/VALVE (MISCELLANEOUS) ×4 IMPLANT
CANNULA REDUC XI 12-8 STAPL (CANNULA) ×3
CANNULA REDUC XI 12-8MM STAPL (CANNULA) ×1
CANNULA REDUCER 12-8 DVNC XI (CANNULA) ×2 IMPLANT
CHLORAPREP W/TINT 26 (MISCELLANEOUS) ×4 IMPLANT
COVER TIP SHEARS 8 DVNC (MISCELLANEOUS) ×2 IMPLANT
COVER TIP SHEARS 8MM DA VINCI (MISCELLANEOUS) ×4
COVER WAND RF STERILE (DRAPES) ×4 IMPLANT
DECANTER SPIKE VIAL GLASS SM (MISCELLANEOUS) ×4 IMPLANT
DEFOGGER SCOPE WARMER CLEARIFY (MISCELLANEOUS) ×4 IMPLANT
DERMABOND ADVANCED (GAUZE/BANDAGES/DRESSINGS) ×2
DERMABOND ADVANCED .7 DNX12 (GAUZE/BANDAGES/DRESSINGS) ×2 IMPLANT
DRAPE ARM DVNC X/XI (DISPOSABLE) ×8 IMPLANT
DRAPE COLUMN DVNC XI (DISPOSABLE) ×2 IMPLANT
DRAPE DA VINCI XI ARM (DISPOSABLE) ×16
DRAPE DA VINCI XI COLUMN (DISPOSABLE) ×4
ELECT REM PT RETURN 9FT ADLT (ELECTROSURGICAL) ×4
ELECTRODE REM PT RTRN 9FT ADLT (ELECTROSURGICAL) ×2 IMPLANT
GELPOINT ADV PLATFORM (ENDOMECHANICALS)
GLOVE ORTHO TXT STRL SZ7.5 (GLOVE) ×16 IMPLANT
GOWN STRL REUS W/ TWL LRG LVL3 (GOWN DISPOSABLE) ×12 IMPLANT
GOWN STRL REUS W/TWL LRG LVL3 (GOWN DISPOSABLE) ×24
GRASPER SUT TROCAR 14GX15 (MISCELLANEOUS) ×4 IMPLANT
HANDLE YANKAUER SUCT BULB TIP (MISCELLANEOUS) IMPLANT
IRRIGATION STRYKERFLOW (MISCELLANEOUS) IMPLANT
IRRIGATOR STRYKERFLOW (MISCELLANEOUS)
IV NS IRRIG 3000ML ARTHROMATIC (IV SOLUTION) ×4 IMPLANT
KIT IMAGING PINPOINTPAQ (MISCELLANEOUS) ×4 IMPLANT
KIT PINK PAD W/HEAD ARE REST (MISCELLANEOUS) ×4
KIT PINK PAD W/HEAD ARM REST (MISCELLANEOUS) ×2 IMPLANT
KIT TURNOVER KIT A (KITS) IMPLANT
NEEDLE HYPO 22GX1.5 SAFETY (NEEDLE) ×4 IMPLANT
NEEDLE INSUFFLATION 14GA 120MM (NEEDLE) ×4 IMPLANT
NS IRRIG 500ML POUR BTL (IV SOLUTION) ×4 IMPLANT
PACK COLON CLEAN CLOSURE (MISCELLANEOUS) IMPLANT
PACK LAP CHOLECYSTECTOMY (MISCELLANEOUS) ×4 IMPLANT
PLATFORM STD W/COL CELL SVR (ENDOMECHANICALS) IMPLANT
PORT ACCESS TROCAR AIRSEAL 12 (TROCAR) ×2 IMPLANT
PORT ACCESS TROCAR AIRSEAL 5M (TROCAR) ×2
RELOAD STAPLER 3.5X45 BLU DVNC (STAPLE) ×8 IMPLANT
RELOAD STAPLER 3.5X60 BLU DVNC (STAPLE) IMPLANT
RETRACTOR WOUND ALXS 18CM MED (MISCELLANEOUS) IMPLANT
RETRACTOR WOUND ALXS 18CM SML (MISCELLANEOUS) ×2 IMPLANT
RTRCTR WOUND ALEXIS O 18CM MED (MISCELLANEOUS)
RTRCTR WOUND ALEXIS O 18CM SML (MISCELLANEOUS) ×4
SCISSORS METZENBAUM CVD 33 (INSTRUMENTS) IMPLANT
SEAL CANN UNIV 5-8 DVNC XI (MISCELLANEOUS) ×6 IMPLANT
SEAL XI 5MM-8MM UNIVERSAL (MISCELLANEOUS) ×12
SEALER VESSEL DA VINCI XI (MISCELLANEOUS) ×4
SEALER VESSEL EXT DVNC XI (MISCELLANEOUS) ×2 IMPLANT
SET BI-LUMEN FLTR TB AIRSEAL (TUBING) ×4 IMPLANT
SOLUTION ELECTROLUBE (MISCELLANEOUS) ×4 IMPLANT
STAPLER 45 DA VINCI SURE FORM (STAPLE) ×4
STAPLER 45 SUREFORM DVNC (STAPLE) ×2 IMPLANT
STAPLER 60 DA VINCI SURE FORM (STAPLE)
STAPLER 60 SUREFORM DVNC (STAPLE) IMPLANT
STAPLER CANNULA SEAL DVNC XI (STAPLE) ×2 IMPLANT
STAPLER CANNULA SEAL XI (STAPLE) ×4
STAPLER RELOAD 3.5X45 BLU DVNC (STAPLE) ×8
STAPLER RELOAD 3.5X45 BLUE (STAPLE) ×16
STAPLER RELOAD 3.5X60 BLU DVNC (STAPLE)
STAPLER RELOAD 3.5X60 BLUE (STAPLE)
STAPLER SKIN PROX 35W (STAPLE) IMPLANT
SUT DVC VLOC 90 3-0 CV23 VLT (SUTURE)
SUT MNCRL 4-0 (SUTURE) ×8
SUT MNCRL 4-0 27XMFL (SUTURE) ×4
SUT SILK 2 0 SH (SUTURE) ×8 IMPLANT
SUT V-LOC 90 ABS 3-0 VLT  V-20 (SUTURE) ×8
SUT V-LOC 90 ABS 3-0 VLT V-20 (SUTURE) ×4 IMPLANT
SUT VIC AB 3-0 SH 27 (SUTURE)
SUT VIC AB 3-0 SH 27X BRD (SUTURE) IMPLANT
SUTURE DVC VLC 90 3-0 CV23 VLT (SUTURE) IMPLANT
SUTURE MNCRL 4-0 27XMF (SUTURE) ×4 IMPLANT
TRAY FOLEY MTR SLVR 16FR STAT (SET/KITS/TRAYS/PACK) ×4 IMPLANT
TROCAR PORT AIRSEAL 8X100 (TROCAR) ×4 IMPLANT
TROCAR Z-THREAD FIOS 11X100 BL (TROCAR) ×4 IMPLANT
TROCAR Z-THREAD OPTICAL 5X100M (TROCAR) IMPLANT
TUBING EVAC SMOKE HEATED PNEUM (TUBING) IMPLANT

## 2019-10-28 NOTE — Transfer of Care (Signed)
Immediate Anesthesia Transfer of Care Note  Patient: Maria Davies  Procedure(s) Performed: XI ROBOT ASSISTED LAPAROSCOPIC PARTIAL COLECTOMY, Right (N/A ) INDOCYANINE GREEN FLUORESCENCE IMAGING (ICG)  Patient Location: PACU  Anesthesia Type:General  Level of Consciousness: awake, alert  and oriented  Airway & Oxygen Therapy: Patient Spontanous Breathing  Post-op Assessment: Report given to RN and Post -op Vital signs reviewed and stable  Post vital signs: Reviewed and stable  Last Vitals:  Vitals Value Taken Time  BP 104/40 10/28/19 1324  Temp    Pulse 90 10/28/19 1325  Resp 23 10/28/19 1325  SpO2 100 % 10/28/19 1325  Vitals shown include unvalidated device data.  Last Pain:  Vitals:   10/28/19 0644  TempSrc: Tympanic  PainSc: 0-No pain         Complications: No apparent anesthesia complications

## 2019-10-28 NOTE — Interval H&P Note (Signed)
History and Physical Interval Note:  10/28/2019 8:00 AM  Maria Davies  has presented today for surgery, with the diagnosis of Right colon cancer, mass colon.  The various methods of treatment have been discussed with the patient and family. After consideration of risks, benefits and other options for treatment, the patient has consented to  Procedure(s): XI ROBOT ASSISTED LAPAROSCOPIC PARTIAL COLECTOMY, Right (N/A) as a surgical intervention.  The patient's history has been reviewed, patient examined, no change in status, stable for surgery.  I have reviewed the patient's chart and labs.  Questions were answered to the patient's satisfaction.     Ronny Bacon, M.D., [facility]  10/28/2019 ; 8:00 AM

## 2019-10-28 NOTE — Op Note (Signed)
Robotic extended right hemicolectomy.  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  Post-operative Diagnosis: same.    Surgeon: Ronny Bacon, M.D., FACS  Anesthesia: General  Findings: Tattooed area as expected right upper quadrant, prior scarring from open cholecystectomy.  No evidence of peritoneal studding, all visualized area of the liver appeared normal, without any evidence of metastatic disease.  No peritoneal fluid present.  Excellent bowel prep.  Estimated Blood Loss: 100 mL         Specimens: Right colon.          Complications: none              Procedure Details  The patient was seen again in the Holding Room. The benefits, complications, treatment options, and expected outcomes were discussed with the patient. The risks of bleeding, infection, recurrence of symptoms, failure to resolve symptoms, unanticipated injury, prosthetic placement, prosthetic infection, any of which could require further surgery were reviewed with the patient. The likelihood of improving the patient's symptoms with return to their baseline status is reasonable.  The patient and/or family concurred with the proposed plan, giving informed consent.  The patient was taken to Operating Room, identified and the procedure verified.    Prior to the induction of general anesthesia, antibiotic prophylaxis was administered. VTE prophylaxis was in place.  General anesthesia was then administered and tolerated well. After the induction, the patient was positioned in the supine position, her umbilicus was positioned over the mid table break, and a Foley was placed.  And the abdomen was prepped with Chloraprep and draped in the sterile fashion.  A Time Out was held and the above information confirmed.  After local infiltration of quarter percent Marcaine with epinephrine, stab incision was made left upper quadrant.  Just below the costal margin approximately midclavicular line the Veress  needle is passed with sensation of the layers to penetrate the abdominal wall and into the peritoneum.  Saline drop test is confirmed peritoneal placement.  Insufflation is initiated with carbon dioxide to pressures of 15 mmHg.  A 12 mm air seal port was placed under direct visualization in the left upper quadrant.  Under direct visualization 3 8.5 mm were ports were placed across the lower abdomen, the middle port was placed approximately suprapubic for anticipated extraction site.  An 12 millimeters robotic port was placed in the left flank, for stapler access.  Utilized local anesthesia with all port placements infiltrating the skin and subcutaneous tissues and fascial tissues.  We used a mixture of Exparel with quarter percent Marcaine.  Initial visualization confirmed the right upper quadrant tattooed colon along with the omentum well scarred to the anterior abdominal wall.  There is no other disease process appreciable.  No other adhesions to address. Patient was then positioned in left side down at about 12 degrees and due to flexing the bed to allow extension between her costal margin and her anterior superior iliac spine, no additional Trendelenburg was necessary.  We utilized a tips up fenestrated grasper, forced bipolar fenestrated grasper, monopolar scissors.  Proceeded with identifying the ileocolic vascular pedicle and initiated the retroperitoneal dissection utilizing hot monopolar scissors and blunt dissection.  We were able to obtain a good plane, identifying the duodenum.  The ileocolic pedicle was then isolated at its junction, multiple ceilings with the vessel sealer were performed prior to its division.  We then proceeded with retromesenteric dissection laterally, and cephalad to its full extent to expose the mid transverse colon, and the posterior aspect of  the cecum laterally. With this completed we then proceeded with the lateral attachments dividing these with the vessel sealer,  fully mobilizing the right colon.  All I also divided the omental colic ligaments along the transverse colon to allow freedom of the proximal transverse colon and the hepatic flexure for eventual specimen.  I then selected points of the distal ileum and the mid transverse colon and utilizing ICG confirmed adequate vascularity of these areas prior to division.  It took 2 staple firings of the 45 mm stapler to divide the transverse colon, and 1 for the distal ileum.  I then finished lysing the residual attachments of the specimen and placed up in the left upper quadrant.  I then needed to proceed with dividing the omental adhesions to the right upper quadrant to allow more freedom of the transverse colon for anastomosis.  Did the same with the distal ileum to ensure that they were ready to approximate under no tension whatsoever. I then placed a 2-0 silk suture to align the colon and the small bowel in and iso-peristaltic manner.  Enterotomies were performed and colotomy performed, and utilizing another 45 mm cartridge stapler created the common channel.  The staple line was evaluated for adequate hemostasis.  I then closed the enterotomy with a 3-0 running V-Loc in 2 layers.  I used an additional 3 oh V-Loc to close the mesenteric defect.  We then undocked the robot, I then withdrew the suprapubic 8.5 mm trocar and created a Pfannenstiel incision of approximately 2 inches.  I then placed a wound retractor, and extracted the specimen. That Pfannenstiel fascia was then closed with running 0 Vicryl. I then irrigated the abdomen with 3 L of sterile saline, and antibiotic solution. Then closed the fascia at the 12 mm trocar and the air seal port site using PMI and a 0 Vicryl under direct visualization.  All the other ports removed the patient's abdomen was allowed to desufflate. We then closed the skin with interrupted and running subcuticulars of 4-0 Monocryl, sealing the skin with Dermabond. She appeared to  tolerate procedure well.        Ronny Bacon M.D., [facility] 10/28/2019 1:36 PM

## 2019-10-28 NOTE — Progress Notes (Signed)
Robotic extended right hemicolectomy.  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  Post-operative Diagnosis: same.    Surgeon: Ronny Bacon, M.D., FACS  Anesthesia: General  Findings: Tattooed area as expected right upper quadrant, prior scarring from open cholecystectomy.  No evidence of peritoneal studding, all visualized area of the liver appeared normal, without any evidence of metastatic disease.  No peritoneal fluid present.  Excellent bowel prep.  Estimated Blood Loss: 100 mL         Specimens: Right colon.          Complications: none              Procedure Details  The patient was seen again in the Holding Room. The benefits, complications, treatment options, and expected outcomes were discussed with the patient. The risks of bleeding, infection, recurrence of symptoms, failure to resolve symptoms, unanticipated injury, prosthetic placement, prosthetic infection, any of which could require further surgery were reviewed with the patient. The likelihood of improving the patient's symptoms with return to their baseline status is reasonable.  The patient and/or family concurred with the proposed plan, giving informed consent.  The patient was taken to Operating Room, identified and the procedure verified.    Prior to the induction of general anesthesia, antibiotic prophylaxis was administered. VTE prophylaxis was in place.  General anesthesia was then administered and tolerated well. After the induction, the patient was positioned in the supine position, her umbilicus was positioned over the mid table break, and a Foley was placed.  And the abdomen was prepped with Chloraprep and draped in the sterile fashion.  A Time Out was held and the above information confirmed.  After local infiltration of quarter percent Marcaine with epinephrine, stab incision was made left upper quadrant.  Just below the costal margin approximately midclavicular line the Veress  needle is passed with sensation of the layers to penetrate the abdominal wall and into the peritoneum.  Saline drop test is confirmed peritoneal placement.  Insufflation is initiated with carbon dioxide to pressures of 15 mmHg.  A 12 mm air seal port was placed under direct visualization in the left upper quadrant.  Under direct visualization 3 8.5 mm were ports were placed across the lower abdomen, the middle port was placed approximately suprapubic for anticipated extraction site.  An 12 millimeters robotic port was placed in the left flank, for stapler access.  Utilized local anesthesia with all port placements infiltrating the skin and subcutaneous tissues and fascial tissues.  We used a mixture of Exparel with quarter percent Marcaine.  Initial visualization confirmed the right upper quadrant tattooed colon along with the omentum well scarred to the anterior abdominal wall.  There is no other disease process appreciable.  No other adhesions to address. Patient was then positioned in left side down at about 12 degrees and due to flexing the bed to allow extension between her costal margin and her anterior superior iliac spine, no additional Trendelenburg was necessary.  We utilized a tips up fenestrated grasper, forced bipolar fenestrated grasper, monopolar scissors.  Proceeded with identifying the ileocolic vascular pedicle and initiated the retroperitoneal dissection utilizing hot monopolar scissors and blunt dissection.  We were able to obtain a good plane, identifying the duodenum.  The ileocolic pedicle was then isolated at its junction, multiple ceilings with the vessel sealer were performed prior to its division.  We then proceeded with retromesenteric dissection laterally, and cephalad to its full extent to expose the mid transverse colon, and the posterior aspect of  the cecum laterally. With this completed we then proceeded with the lateral attachments dividing these with the vessel sealer,  fully mobilizing the right colon.  All I also divided the omental colic ligaments along the transverse colon to allow freedom of the proximal transverse colon and the hepatic flexure for eventual specimen.  I then selected points of the distal ileum and the mid transverse colon and utilizing ICG confirmed adequate vascularity of these areas prior to division.  It took 2 staple firings of the 45 mm stapler to divide the transverse colon, and 1 for the distal ileum.  I then finished lysing the residual attachments of the specimen and placed up in the left upper quadrant.  I then needed to proceed with dividing the omental adhesions to the right upper quadrant to allow more freedom of the transverse colon for anastomosis.  Did the same with the distal ileum to ensure that they were ready to approximate under no tension whatsoever. I then placed a 2-0 silk suture to align the colon and the small bowel in and iso-peristaltic manner.  Enterotomies were performed and colotomy performed, and utilizing another 45 mm cartridge stapler created the common channel.  The staple line was evaluated for adequate hemostasis.  I then closed the enterotomy with a 3-0 running V-Loc in 2 layers.  I used an additional 3 oh V-Loc to close the mesenteric defect.  We then undocked the robot, I then withdrew the suprapubic 8.5 mm trocar and created a Pfannenstiel incision of approximately 2 inches.  I then placed a wound retractor, and extracted the specimen. That Pfannenstiel fascia was then closed with running 0 Vicryl. I then irrigated the abdomen with 3 L of sterile saline, and antibiotic solution. Then closed the fascia at the 12 mm trocar and the air seal port site using PMI and a 0 Vicryl under direct visualization.  All the other ports removed the patient's abdomen was allowed to desufflate. We then closed the skin with interrupted and running subcuticulars of 4-0 Monocryl, sealing the skin with Dermabond. She appeared to  tolerate procedure well.        Ronny Bacon M.D., [facility] 10/28/2019 1:36 PM

## 2019-10-28 NOTE — Anesthesia Preprocedure Evaluation (Addendum)
Anesthesia Evaluation  Patient identified by MRN, date of birth, ID band Patient awake    Reviewed: Allergy & Precautions, H&P , NPO status , Patient's Chart, lab work & pertinent test results  Airway Mallampati: II  TM Distance: >3 FB     Dental  (+) Teeth Intact   Pulmonary neg pulmonary ROS, neg COPD,    breath sounds clear to auscultation       Cardiovascular hypertension, + Valvular Problems/Murmurs AS  Rhythm:regular Rate:Normal + Systolic murmurs    Neuro/Psych negative neurological ROS  negative psych ROS   GI/Hepatic negative GI ROS, Neg liver ROS,   Endo/Other  Hypothyroidism   Renal/GU      Musculoskeletal   Abdominal   Peds  Hematology negative hematology ROS (+)   Anesthesia Other Findings Past Medical History: No date: Anemia No date: Arthritis No date: Basal cell carcinoma     Comment:  back 2010: Breast cancer ([place])     Comment:  RT LUMPECTOMY No date: Cancer ([place])     Comment:  rt breast No date: Heart murmur No date: Hypertension No date: Hypothyroidism     Comment:  currently not on meds No date: Osteopenia 2010: Personal history of chemotherapy     Comment:  right breast ca 2010: Personal history of radiation therapy     Comment:  right breast ca  Past Surgical History: No date: BACK SURGERY 03/2009: BREAST EXCISIONAL BIOPSY; Right     Comment:  radiation and chemo + No date: BREAST EXCISIONAL BIOPSY; Bilateral     Comment:  NEG No date: BREAST SURGERY No date: CATARACT EXTRACTION W/ INTRAOCULAR LENS  IMPLANT, BILATERAL No date: CHOLECYSTECTOMY 10/19/2019: COLONOSCOPY WITH PROPOFOL; N/A     Comment:  Procedure: COLONOSCOPY WITH PROPOFOL-attempted, unable               to perform poor prep;  Surgeon: Lucilla Lame, MD;                Location: [place];  Service: Endoscopy;                Laterality: N/A;  priority 3 10/20/2019: COLONOSCOPY WITH PROPOFOL; N/A      Comment:  Procedure: COLONOSCOPY WITH PROPOFOL;  Surgeon: Lucilla Lame, MD;  Location: [facility];  Service:               Endoscopy;  Laterality: N/A; 10/19/2019: ESOPHAGEAL DILATION     Comment:  Procedure: ESOPHAGEAL DILATION;  Surgeon: Lucilla Lame,               MD;  Location: [place];  Service: Endoscopy;; 10/19/2019: ESOPHAGOGASTRODUODENOSCOPY (EGD) WITH PROPOFOL; N/A     Comment:  Procedure: ESOPHAGOGASTRODUODENOSCOPY (EGD) WITH               PROPOFOL;  Surgeon: Lucilla Lame, MD;  Location: [place];  Service: Endoscopy;  Laterality: N/A;  BMI    Body Mass Index: 30.65 kg/m      Reproductive/Obstetrics negative OB ROS                           Anesthesia Physical Anesthesia Plan  ASA: II  Anesthesia Plan: General ETT   Post-op Pain Management:    Induction:   PONV Risk Score and Plan:  Ondansetron, Dexamethasone and Treatment may vary due to age or medical condition  Airway Management Planned:   Additional Equipment:   Intra-op Plan:   Post-operative Plan:   Informed Consent: I have reviewed the patients History and Physical, chart, labs and discussed the procedure including the risks, benefits and alternatives for the proposed anesthesia with the patient or authorized representative who has indicated his/her understanding and acceptance.     Dental Advisory Given  Plan Discussed with: Anesthesiologist  Anesthesia Plan Comments:         Anesthesia Quick Evaluation

## 2019-10-28 NOTE — Anesthesia Procedure Notes (Signed)
Procedure Name: Intubation Performed by: Richardine Peppers L, CRNA Pre-anesthesia Checklist: Patient identified, Patient being monitored, Timeout performed, Emergency Drugs available and Suction available Patient Re-evaluated:Patient Re-evaluated prior to induction Oxygen Delivery Method: Circle system utilized Preoxygenation: Pre-oxygenation with 100% oxygen Induction Type: IV induction Ventilation: Mask ventilation without difficulty Laryngoscope Size: 3 and McGraph Grade View: Grade I Tube type: Oral Tube size: 7.0 mm Number of attempts: 1 Airway Equipment and Method: Stylet Placement Confirmation: ETT inserted through vocal cords under direct vision,  positive ETCO2 and breath sounds checked- equal and bilateral Secured at: 21 cm Tube secured with: Tape Dental Injury: Teeth and Oropharynx as per pre-operative assessment        

## 2019-10-28 NOTE — Anesthesia Postprocedure Evaluation (Signed)
Anesthesia Post Note  Patient: Maria Davies  Procedure(s) Performed: XI ROBOT ASSISTED LAPAROSCOPIC PARTIAL COLECTOMY, Right (N/A ) INDOCYANINE GREEN FLUORESCENCE IMAGING (ICG)  Patient location during evaluation: PACU Anesthesia Type: General Level of consciousness: awake and alert and oriented Pain management: pain level controlled Vital Signs Assessment: post-procedure vital signs reviewed and stable Respiratory status: spontaneous breathing, nonlabored ventilation and respiratory function stable Cardiovascular status: blood pressure returned to baseline and stable Postop Assessment: no signs of nausea or vomiting Anesthetic complications: no     Last Vitals:  Vitals:   10/28/19 1509 10/28/19 1524  BP: (!) 102/54 (!) 106/47  Pulse: 79 65  Resp: 19 15  Temp: (!) 36.2 C   SpO2: 100% 99%    Last Pain:  Vitals:   10/28/19 1524  TempSrc:   PainSc: 0-No pain                 Eveny Anastas

## 2019-10-29 ENCOUNTER — Other Ambulatory Visit: Payer: Medicare Other

## 2019-10-29 LAB — CBC
HCT: 28.9 % — ABNORMAL LOW (ref 36.0–46.0)
Hemoglobin: 10.1 g/dL — ABNORMAL LOW (ref 12.0–15.0)
MCH: 30.1 pg (ref 26.0–34.0)
MCHC: 34.9 g/dL (ref 30.0–36.0)
MCV: 86 fL (ref 80.0–100.0)
Platelets: 216 10*3/uL (ref 150–400)
RBC: 3.36 MIL/uL — ABNORMAL LOW (ref 3.87–5.11)
RDW: 14.6 % (ref 11.5–15.5)
WBC: 11.9 10*3/uL — ABNORMAL HIGH (ref 4.0–10.5)
nRBC: 0 % (ref 0.0–0.2)

## 2019-10-29 LAB — BASIC METABOLIC PANEL
Anion gap: 7 (ref 5–15)
BUN: 13 mg/dL (ref 8–23)
CO2: 22 mmol/L (ref 22–32)
Calcium: 8.2 mg/dL — ABNORMAL LOW (ref 8.9–10.3)
Chloride: 106 mmol/L (ref 98–111)
Creatinine, Ser: 0.68 mg/dL (ref 0.44–1.00)
GFR calc Af Amer: >60 mL/min (ref >60)
GFR calc non Af Amer: >60 mL/min (ref >60)
Glucose, Bld: 112 mg/dL — ABNORMAL HIGH (ref 70–99)
Potassium: 3.4 mmol/L — ABNORMAL LOW (ref 3.5–5.1)
Sodium: 135 mmol/L (ref 135–145)

## 2019-10-29 MED ORDER — POTASSIUM CHLORIDE CRYS ER 20 MEQ PO TBCR
20.0000 meq | EXTENDED_RELEASE_TABLET | Freq: Two times a day (BID) | ORAL | Status: DC
  Administered 2019-10-29 – 2019-11-02 (×9): 20 meq via ORAL
  Filled 2019-10-29 (×9): qty 1

## 2019-10-29 NOTE — Progress Notes (Signed)
Tumor Board Documentation  Maria Davies was presented by Dr Grayland Ormond at our Tumor Board on 10/29/2019, which included representatives from medical oncology, radiation oncology, navigation, pathology, radiology, surgical, surgical oncology, internal medicine, pharmacy, pulmonology, palliative care, research.  Maria Davies currently presents as a current patient, for Maria Davies, for new positive pathology(Old patient, New diagnosis) with history of the following treatments: active survellience, surgical intervention(s).  Additionally, we reviewed previous medical and familial history, history of present illness, and recent lab results along with all available histopathologic and imaging studies. The tumor board considered available treatment options and made the following recommendations:   Patient to follow up with Dr Grayland Ormond in 1-2 weeks  The following procedures/referrals were also placed: No orders of the defined types were placed in this encounter.   Clinical Trial Status: not discussed   Staging used: To be determined   AJCC Staging:       Group: Adenocarcinoma of Colon  National site-specific guidelines   were discussed with respect to the case.  Tumor board is a meeting of clinicians from various specialty areas who evaluate and discuss patients for whom a multidisciplinary approach is being considered. Final determinations in the plan of care are those of the provider(s). The responsibility for follow up of recommendations given during tumor board is that of the provider.   Today's extended care, comprehensive team conference, Maria Davies was not present for the discussion and was not examined.   Multidisciplinary Tumor Board is a multidisciplinary case peer review process.  Decisions discussed in the Multidisciplinary Tumor Board reflect the opinions of the specialists present at the conference without having examined the patient.  Ultimately, treatment and diagnostic decisions rest with the  primary provider(s) and the patient.

## 2019-10-29 NOTE — Progress Notes (Addendum)
[facility] Hospital Day(s): 1.   Post op day(s): 1 Day Post-Op.   Interval History:  Patient seen and examined no acute events or new complaints overnight.  Patient reports she is feeling much better this morning Abdominal soreness improved compared to last night No nausea or emesis Mild leukocytosis to 11.9K, no fevers, likely reactive Mild hypokalemia to 3.4 Renal function normal, sCr - 0.68, UO - 600 ccs She has been on CLD since surgery She does endorse flatus and did have a liquid BM   Vital signs in last 24 hours: [min-max] current  Temp:  [97.2 F (36.2 C)-98.9 F (37.2 C)] 98.4 F (36.9 C) (05/13 0451) Pulse Rate:  [65-110] 101 (05/13 0451) Resp:  [11-20] 16 (05/13 0451) BP: (81-125)/(40-103) 118/57 (05/13 0451) SpO2:  [97 %-100 %] 97 % (05/13 0451)     Height: 5\' 3"  (160 cm) Weight: 78.5 kg BMI (Calculated): 30.65   Intake/Output last 2 shifts:  05/12 0701 - 05/13 0700 In: 5793.9 [P.O.:480; I.V.:4713.9; IV Piggyback:600] Out: 690 [Urine:565; Blood:25]   Physical Exam:  Constitutional: alert, cooperative and no distress  Respiratory: breathing non-labored at rest  Cardiovascular: regular rate and sinus rhythm  Gastrointestinal: Soft, mild incisional soreness, and non-distended, no rebound/guarding Integumentary: Laparoscopic incisions are CDI with dermabond, no erythema or drainage  Labs:  CBC Latest Ref Rng & Units 10/29/2019 10/28/2019 10/22/2019  WBC 4.0 - 10.5 K/uL 11.9(H) 11.6(H) 8.5  Hemoglobin 12.0 - 15.0 g/dL 10.1(L) 11.6(L) 12.5  Hematocrit 36.0 - 46.0 % 28.9(L) 33.9(L) 36.5  Platelets 150 - 400 K/uL 216 212 288   CMP Latest Ref Rng & Units 10/29/2019 10/22/2019 08/05/2019  Glucose 70 - 99 mg/dL 112(H) 118(H) 98  BUN 8 - 23 mg/dL 13 8 11   Creatinine 0.44 - 1.00 mg/dL 0.68 0.60 0.53(L)  Sodium 135 - 145 mmol/L 135 141 140  Potassium 3.5 - 5.1 mmol/L 3.4(L) 3.8 4.2  Chloride 98 - 111 mmol/L 106 109 102  CO2 22 -  32 mmol/L 22 22 23   Calcium 8.9 - 10.3 mg/dL 8.2(L) 9.2 9.4  Total Protein 6.5 - 8.1 g/dL - 6.6 6.4  Total Bilirubin 0.3 - 1.2 mg/dL - 0.7 0.2  Alkaline Phos 38 - 126 U/L - 70 70  AST 15 - 41 U/L - 25 18  ALT 0 - 44 U/L - 38 13     Imaging studies: No new pertinent imaging studies   Assessment/Plan: 79 y.o. female with overall doing weill with expected post-op soreness and with return of bowel function 1 Day Post-Op s/p robotic assisted laparoscopic extended right hemicolectomy for hepatic flexure adenocarcinoma   - Advance to full liquid diet; if she does well today and continues with bowel function we can advance to soft diet tonight vs tomorrow morning   - Replete K+; monitor   - Discontinue IVF  - Discontinue entereg   - pain control prn; antiemetics prn  - monitor abdominal examination; on-going bowel function  - mobilization encouraged  - follow up surgical pathology    - medical management of comorbid conditions  - DVT prophylaxis    - Discharge Planning: If continues to do well clinically and tolerates advancement of diet then hopefully home in next 24-48 hours.    All of the above findings and recommendations were discussed with the patient, patient's family (daughter at bedside), and the medical team, and all of patient's questions were answered to her expressed satisfaction.  -- Edison Simon, PA-C  [facility] 10/29/2019, 7:13 AM [phone] M-F: 7am - 4pm

## 2019-10-29 NOTE — Care Management Important Message (Signed)
Important Message  Patient Details  Name: Maria Davies MRN: [id number] Date of Birth: Oct 19, 1940   Medicare Important Message Given:  Yes  Initial Medicare IM given by Patient Access Associate on 10/29/2019 at 9:37am.     Dannette Barbara 10/29/2019, 1:51 PM

## 2019-10-29 NOTE — Progress Notes (Signed)
Patient said she is not supposed to be taking synthroid for 6 weeks per her primary MD. Edison Simon gave order to discontinue the synthroid

## 2019-10-30 LAB — BASIC METABOLIC PANEL
Anion gap: 6 (ref 5–15)
BUN: 11 mg/dL (ref 8–23)
CO2: 26 mmol/L (ref 22–32)
Calcium: 8.7 mg/dL — ABNORMAL LOW (ref 8.9–10.3)
Chloride: 100 mmol/L (ref 98–111)
Creatinine, Ser: 0.42 mg/dL — ABNORMAL LOW (ref 0.44–1.00)
GFR calc Af Amer: >60 mL/min (ref >60)
GFR calc non Af Amer: >60 mL/min (ref >60)
Glucose, Bld: 138 mg/dL — ABNORMAL HIGH (ref 70–99)
Potassium: 3.9 mmol/L (ref 3.5–5.1)
Sodium: 132 mmol/L — ABNORMAL LOW (ref 135–145)

## 2019-10-30 LAB — CBC
HCT: 32.1 % — ABNORMAL LOW (ref 36.0–46.0)
Hemoglobin: 11.3 g/dL — ABNORMAL LOW (ref 12.0–15.0)
MCH: 30.2 pg (ref 26.0–34.0)
MCHC: 35.2 g/dL (ref 30.0–36.0)
MCV: 85.8 fL (ref 80.0–100.0)
Platelets: 232 10*3/uL (ref 150–400)
RBC: 3.74 MIL/uL — ABNORMAL LOW (ref 3.87–5.11)
RDW: 14 % (ref 11.5–15.5)
WBC: 12.9 10*3/uL — ABNORMAL HIGH (ref 4.0–10.5)
nRBC: 0 % (ref 0.0–0.2)

## 2019-10-30 MED ORDER — ALUM & MAG HYDROXIDE-SIMETH 200-200-20 MG/5ML PO SUSP
30.0000 mL | Freq: Four times a day (QID) | ORAL | Status: DC | PRN
  Administered 2019-10-30: 30 mL via ORAL
  Filled 2019-10-30: qty 30

## 2019-10-30 NOTE — Progress Notes (Signed)
[facility] Hospital Day(s): 2.   Post op day(s): 2 Days Post-Op.   Interval History:  Patient seen and examined Had some bilious vomiting last night, while still having BMs and flatus.  Patient reports she is feeling much better this morning Abdominal soreness improved compared to last night Mild leukocytosis to 12.9K, no fevers, likely reactive Renal function normal, sCr - 0.42, UO - not well measured.    Vital signs in last 24 hours: [min-max] current  Temp:  [98.4 F (36.9 C)-98.6 F (37 C)] 98.4 F (36.9 C) (05/14 0348) Pulse Rate:  [74-96] 96 (05/14 0919) Resp:  [16-18] 18 (05/14 0348) BP: (104-131)/(57-74) 114/57 (05/14 0919) SpO2:  [96 %-100 %] 98 % (05/14 0919)     Height: 5\' 3"  (160 cm) Weight: 78.5 kg BMI (Calculated): 30.65   Intake/Output last 2 shifts:  05/13 0701 - 05/14 0700 In: 1281.4 [P.O.:960; I.V.:321.4] Out: -    Physical Exam:  Constitutional: alert, cooperative and no distress  Respiratory: breathing non-labored at rest  Cardiovascular: regular rate and sinus rhythm  Gastrointestinal: Soft, and non-distended, no rebound/guarding, able to rock abdomen quite aggressively without any voluntary or involuntary guarding.  Integumentary: Laparoscopic incisions are CDI with dermabond, no erythema or drainage  Labs:  CBC Latest Ref Rng & Units 10/30/2019 10/29/2019 10/28/2019  WBC 4.0 - 10.5 K/uL 12.9(H) 11.9(H) 11.6(H)  Hemoglobin 12.0 - 15.0 g/dL 11.3(L) 10.1(L) 11.6(L)  Hematocrit 36.0 - 46.0 % 32.1(L) 28.9(L) 33.9(L)  Platelets 150 - 400 K/uL 232 216 212   CMP Latest Ref Rng & Units 10/30/2019 10/29/2019 10/22/2019  Glucose 70 - 99 mg/dL 138(H) 112(H) 118(H)  BUN 8 - 23 mg/dL 11 13 8   Creatinine 0.44 - 1.00 mg/dL 0.42(L) 0.68 0.60  Sodium 135 - 145 mmol/L 132(L) 135 141  Potassium 3.5 - 5.1 mmol/L 3.9 3.4(L) 3.8  Chloride 98 - 111 mmol/L 100 106 109  CO2 22 - 32 mmol/L 26 22 22   Calcium 8.9 - 10.3 mg/dL 8.7(L)  8.2(L) 9.2  Total Protein 6.5 - 8.1 g/dL - - 6.6  Total Bilirubin 0.3 - 1.2 mg/dL - - 0.7  Alkaline Phos 38 - 126 U/L - - 70  AST 15 - 41 U/L - - 25  ALT 0 - 44 U/L - - 38     Imaging studies: No new pertinent imaging studies   Assessment/Plan: 79 y.o. 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   - pain control prn; antiemetics prn  - monitor abdominal examination; on-going bowel function  - mobilization encouraged  - follow up surgical pathology    - medical management of comorbid conditions  - DVT prophylaxis    - Discharge Planning: If continues to do well clinically and tolerates advancement of diet then hopefully home in next 24-48 hours.    All of the above findings and recommendations were discussed with the patient, patient's family (daughter at bedside), and the medical team, and all of patient's questions were answered to her expressed satisfaction.  -- Ronny Bacon, MD [facility] 10/30/2019, 9:46 AM [phone] M-F: 7am - 4pm

## 2019-10-31 NOTE — Progress Notes (Signed)
[facility] Hospital Day(s): 3.   Post op day(s): 3 Days Post-Op.   Interval History: Patient seen and examined, no acute events or new complaints overnight. Patient reports feeling better this morning.  She reports that the nausea from yesterday has been improving.  She had some clear liquids for breakfast and she tolerated well.  She reported she continue passing gas.  She reported the pain is controlled.  She reported that she has not ambulated yet.  Denies fever or chills.  Vital signs in last 24 hours: [min-max] current  Temp:  [97.5 F (36.4 C)-99.1 F (37.3 C)] 98.7 F (37.1 C) (05/15 0421) Pulse Rate:  [83-94] 83 (05/15 0421) Resp:  [17-20] 18 (05/15 0421) BP: (107-123)/(52-56) 117/52 (05/15 0421) SpO2:  [98 %-100 %] 98 % (05/15 0421)     Height: 5\' 3"  (160 cm) Weight: 78.5 kg BMI (Calculated): 30.65   Physical Exam:  Constitutional: alert, cooperative and no distress  Respiratory: breathing non-labored at rest  Cardiovascular: regular rate and sinus rhythm  Gastrointestinal: soft, non-tender, and non-distended. Wounds are dry and clean.   Labs:  CBC Latest Ref Rng & Units 10/30/2019 10/29/2019 10/28/2019  WBC 4.0 - 10.5 K/uL 12.9(H) 11.9(H) 11.6(H)  Hemoglobin 12.0 - 15.0 g/dL 11.3(L) 10.1(L) 11.6(L)  Hematocrit 36.0 - 46.0 % 32.1(L) 28.9(L) 33.9(L)  Platelets 150 - 400 K/uL 232 216 212   CMP Latest Ref Rng & Units 10/30/2019 10/29/2019 10/22/2019  Glucose 70 - 99 mg/dL 138(H) 112(H) 118(H)  BUN 8 - 23 mg/dL 11 13 8   Creatinine 0.44 - 1.00 mg/dL 0.42(L) 0.68 0.60  Sodium 135 - 145 mmol/L 132(L) 135 141  Potassium 3.5 - 5.1 mmol/L 3.9 3.4(L) 3.8  Chloride 98 - 111 mmol/L 100 106 109  CO2 22 - 32 mmol/L 26 22 22   Calcium 8.9 - 10.3 mg/dL 8.7(L) 8.2(L) 9.2  Total Protein 6.5 - 8.1 g/dL - - 6.6  Total Bilirubin 0.3 - 1.2 mg/dL - - 0.7  Alkaline Phos 38 - 126 U/L - - 70  AST 15 - 41 U/L - - 25  ALT 0 - 44 U/L - - 38    Imaging studies: No new pertinent  imaging studies   Assessment/Plan:  79 y.o. female with hepatic flexure adenocarcinoma 3 Days Post-Op s/p robotic assisted laparoscopic right colectomy with anastomosis.  Patient reports feeling better today.  I see that she had an intermittent ileus but it is resolving.  I will advance her diet to full liquids.  We will continue with pain management.  I encouraged the patient to ambulate.  We will continue with DVT prophylaxis.   Arnold Long, MD

## 2019-11-01 NOTE — Progress Notes (Signed)
[facility] Hospital Day(s): 4.   Post op day(s): 4 Days Post-Op.   Interval History: Patient seen and examined, no acute events or new complaints overnight. Patient reports feeling better today.  She reports that she tolerated full liquids.  She denies nausea or vomiting.  She reports the pain is controlled.  Vital signs in last 24 hours: [min-max] current  Temp:  [97.7 F (36.5 C)-98.3 F (36.8 C)] 98.3 F (36.8 C) (05/16 1956) Pulse Rate:  [76-94] 94 (05/16 1956) Resp:  [15-16] 16 (05/16 1956) BP: (104-119)/(55-74) 115/55 (05/16 1956) SpO2:  [96 %-100 %] 96 % (05/16 1956)     Height: 5\' 3"  (160 cm) Weight: 78.5 kg BMI (Calculated): 30.65   Physical Exam:  Constitutional: alert, cooperative and no distress  Respiratory: breathing non-labored at rest  Cardiovascular: regular rate and sinus rhythm  Gastrointestinal: soft, non-tender, and non-distended.  Wounds are dry and clean  Labs:  CBC Latest Ref Rng & Units 10/30/2019 10/29/2019 10/28/2019  WBC 4.0 - 10.5 K/uL 12.9(H) 11.9(H) 11.6(H)  Hemoglobin 12.0 - 15.0 g/dL 11.3(L) 10.1(L) 11.6(L)  Hematocrit 36.0 - 46.0 % 32.1(L) 28.9(L) 33.9(L)  Platelets 150 - 400 K/uL 232 216 212   CMP Latest Ref Rng & Units 10/30/2019 10/29/2019 10/22/2019  Glucose 70 - 99 mg/dL 138(H) 112(H) 118(H)  BUN 8 - 23 mg/dL 11 13 8   Creatinine 0.44 - 1.00 mg/dL 0.42(L) 0.68 0.60  Sodium 135 - 145 mmol/L 132(L) 135 141  Potassium 3.5 - 5.1 mmol/L 3.9 3.4(L) 3.8  Chloride 98 - 111 mmol/L 100 106 109  CO2 22 - 32 mmol/L 26 22 22   Calcium 8.9 - 10.3 mg/dL 8.7(L) 8.2(L) 9.2  Total Protein 6.5 - 8.1 g/dL - - 6.6  Total Bilirubin 0.3 - 1.2 mg/dL - - 0.7  Alkaline Phos 38 - 126 U/L - - 70  AST 15 - 41 U/L - - 25  ALT 0 - 44 U/L - - 38    Imaging studies: No new pertinent imaging studies   Assessment/Plan:  79 y.o. female with hepatic flexure adenocarcinoma 4 Days Post-Op s/p robotic assisted laparoscopic right colectomy with  anastomosis.  Patient doing better.  She tolerated full liquids.  Will advance diet to soft diet.  Pain control.  Encourage the patient to ambulate.  We will continue with DVT prophylaxis.  If patient tolerates soft diet may be discharged tomorrow.  Arnold Long, MD

## 2019-11-01 NOTE — Plan of Care (Signed)
Patient doing well today.  No complaints of pain.  Diet has been advanced to soft and she has tolerated it well.  She has been up ambulating in the hallway multiple times.  Plan to discharge tomorrow.

## 2019-11-02 MED ORDER — IBUPROFEN 800 MG PO TABS
800.0000 mg | ORAL_TABLET | Freq: Three times a day (TID) | ORAL | 0 refills | Status: DC | PRN
Start: 2019-11-02 — End: 2019-11-27

## 2019-11-02 NOTE — Discharge Summary (Signed)
Physician Discharge Summary  Patient ID: Maria Davies MRN: [id number] DOB/AGE: [birth year] 79 y.o.  Admit date: 10/28/2019 Discharge date: 11/02/2019  Admission Diagnoses:  Discharge Diagnoses:  Active Problems:   Colon cancer [facility])   Discharged Condition: good  Hospital Course: Patient underwent uneventful robotic right hemicolectomy.  In her postoperative period she was advanced quickly from clear liquids to full's.  Unfortunately she had a bit of a setback with tolerating her diet for uncertain reasons.  So we restarted her after a bit of bowel rest, and she was able to be advanced in her diet.  Her pain was well controlled.  She had no fevers or chills.  All her labs appeared to normalize during her hospitalization.  Her vital signs are stable, she is ambulant and has excellent pain control with no narcotic medications at discharge.  Consults: None  Significant Diagnostic Studies: None this hospitalization.  Path report only.  Treatments: surgery: Robotic right hemicolectomy.  Pathology report confirmed negative resection margin with 1 of 27 lymph nodes positive.  Discharge Exam: Blood pressure 106/65, pulse 93, temperature 97.8 F (36.6 C), temperature source Oral, resp. rate 18, height 5\' 3"  (1.6 m), weight 78.5 kg, SpO2 99 %. Incision/Wound: Pfannenstiel and trocar incisions are all clean, dry and intact. Abdomen is soft and nontender on discharge. Disposition: Discharge disposition: 01-Home or Self Care       Discharge Instructions     Diet - low sodium heart healthy   Complete by: As directed    Driving Restrictions   Complete by: As directed    No driving for 2 weeks.   Increase activity slowly   Complete by: As directed    Lifting restrictions   Complete by: As directed    No lifting more than 15 lab for 4 weeks.      Allergies as of 11/02/2019   No Known Allergies      Medication List     STOP taking these medications    bisacodyl 5 MG EC  tablet Commonly known as: DULCOLAX   erythromycin base 500 MG tablet Commonly known as: E-MYCIN   NEO-SYNEPHRINE NA   neomycin 500 MG tablet Commonly known as: MYCIFRADIN   polyethylene glycol powder 17 GM/SCOOP powder Commonly known as: GLYCOLAX/MIRALAX       TAKE these medications    amLODipine 5 MG tablet Commonly known as: NORVASC Take 1 tablet (5 mg total) by mouth daily.   aspirin EC 81 MG tablet Take 81 mg by mouth daily.   benazepril 40 MG tablet Commonly known as: LOTENSIN Take 1 tablet (40 mg total) by mouth daily.   HYALURONIC ACID PO Take 1 tablet by mouth daily.   ibuprofen 800 MG tablet Commonly known as: ADVIL Take 1 tablet (800 mg total) by mouth every 8 (eight) hours as needed.   levothyroxine 25 MCG tablet Commonly known as: SYNTHROID Take 25 mcg by mouth daily before breakfast.   metoprolol succinate 50 MG 24 hr tablet Commonly known as: TOPROL-XL Take 1 tablet (50 mg total) by mouth daily.   Multi-Vitamins Tabs Take 1 tablet by mouth daily.   OVER THE COUNTER MEDICATION Take 4 capsules by mouth in the morning and at bedtime. Omega 3, resveratrol, icariin, curcumin  (Relief Factor)   PROBIOTIC ADVANCED PO Take 1 capsule by mouth daily.   silver sulfADIAZINE 1 % cream Commonly known as: Silvadene Apply 1 application topically daily. What changed:  when to take this reasons to take this   Vitamin  D3 50 MCG (2000 UT) capsule Take 2,000 Units by mouth daily.       Follow-up Information     Ronny Bacon, MD Follow up in 2 week(s).   Specialty: General Surgery Contact information: [address] [place] [address] [phone]            Signed: Ronny Bacon 11/02/2019, 1:50 PM

## 2019-11-02 NOTE — Plan of Care (Signed)
Discharge order received. Patient mental status is at baseline. Vital signs stable . No signs of acute distress. Discharge instructions given. Patient verbalized understanding. No other issues noted at this time.   

## 2019-11-02 NOTE — Care Management Important Message (Signed)
Important Message  Patient Details  Name: Maria Davies MRN: [id number] Date of Birth: [birth year]   Medicare Important Message Given:  Yes     Dannette Barbara 11/02/2019, 11:25 AM

## 2019-11-05 LAB — SURGICAL PATHOLOGY

## 2019-11-09 DIAGNOSIS — E039 Hypothyroidism, unspecified: Secondary | ICD-10-CM | POA: Diagnosis not present

## 2019-11-13 NOTE — Progress Notes (Signed)
Maria Davies  [phone] Fax:(336) [phone]  ID: Virgina Davies OB: [birth year]  MR#: [id number]  LNZ#:[id number]  Patient Care Team: Valerie Roys, DO as PCP - General (Family Medicine) Guadalupe Maple, MD as PCP - Family Medicine (Family Medicine) Ronny Bacon, MD as Consulting Physician (General Surgery)  CHIEF COMPLAINT: Stage IIIb right colon cancer.  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  Recently, she was noted to have a mild iron deficiency anemia and subsequent colonoscopy and surgery revealed the above-stated malignancy.  She currently feels well and is asymptomatic.  She has no neurologic complaints.  She denies any recent fevers or illnesses.  She has a good appetite and denies weight loss.  She has no chest pain, shortness of breath, cough, or hemoptysis.  She denies any nausea, vomiting, constipation, or diarrhea.  She has no melena or hematochezia.  She has no urinary complaints.  Patient feels at her baseline and offers no specific complaints today.  REVIEW OF SYSTEMS:   Review of Systems  Constitutional: Negative.  Negative for fever, malaise/fatigue and weight loss.  Respiratory: Negative.  Negative for cough, hemoptysis and shortness of breath.   Cardiovascular: Negative.  Negative for chest pain and leg swelling.  Gastrointestinal: Negative.  Negative for abdominal pain, blood in stool and melena.  Genitourinary: Negative.  Negative for hematuria.  Musculoskeletal: Negative.  Negative for back pain.  Skin: Negative.  Negative for rash.  Neurological: Negative.  Negative for dizziness, focal weakness, weakness and headaches.  Psychiatric/Behavioral: Negative.  The patient is not nervous/anxious.     As per HPI. Otherwise, a complete review of systems is negative.  PAST MEDICAL HISTORY: Past Medical History:    Diagnosis Date  . Anemia   . Arthritis   . Basal cell carcinoma    back  . Breast cancer ([place]) 2010   RT LUMPECTOMY  . Cancer (HCC)    rt breast  . Heart murmur   . Hypertension   . Hypothyroidism    currently not on meds  . Osteopenia   . Personal history of chemotherapy 2010   right breast ca  . Personal history of radiation therapy 2010   right breast ca    PAST SURGICAL HISTORY: Past Surgical History:  Procedure Laterality Date  . BACK SURGERY    . BREAST EXCISIONAL BIOPSY Right 03/2009   radiation and chemo +  . BREAST EXCISIONAL BIOPSY Bilateral    NEG  . BREAST SURGERY    . CATARACT EXTRACTION W/ INTRAOCULAR LENS  IMPLANT, BILATERAL    . CHOLECYSTECTOMY    . COLONOSCOPY WITH PROPOFOL N/A 10/19/2019   Procedure: COLONOSCOPY WITH PROPOFOL-attempted, unable to perform poor prep;  Surgeon: Lucilla Lame, MD;  Location: [place];  Service: Endoscopy;  Laterality: N/A;  priority 3  . COLONOSCOPY WITH PROPOFOL N/A 10/20/2019   Procedure: COLONOSCOPY WITH PROPOFOL;  Surgeon: Lucilla Lame, MD;  Location: [facility] ENDOSCOPY;  Service: Endoscopy;  Laterality: N/A;  . ESOPHAGEAL DILATION  10/19/2019   Procedure: ESOPHAGEAL DILATION;  Surgeon: Lucilla Lame, MD;  Location: [place];  Service: Endoscopy;;  . ESOPHAGOGASTRODUODENOSCOPY (EGD) WITH PROPOFOL N/A 10/19/2019   Procedure: ESOPHAGOGASTRODUODENOSCOPY (EGD) WITH PROPOFOL;  Surgeon: Lucilla Lame, MD;  Location: [place];  Service: Endoscopy;  Laterality: N/A;    FAMILY HISTORY: Family History  Problem Relation Age of Onset  . Cancer  Mother   . Heart disease Father   . Breast cancer Paternal Aunt 17    ADVANCED DIRECTIVES (Y/N):  N  HEALTH MAINTENANCE: Social History   Tobacco Use  . Smoking status: Never Smoker  . Smokeless tobacco: Never Used  Substance Use Topics  . Alcohol use: Not Currently    Alcohol/week: 0.0 standard drinks    Comment: No alcohol since 08/05/19  . Drug use: No      Colonoscopy:  PAP:  Bone density:  Lipid panel:  No Known Allergies  Current Outpatient Medications  Medication Sig Dispense Refill  . amLODipine (NORVASC) 5 MG tablet Take 1 tablet (5 mg total) by mouth daily. 90 tablet 1  . aspirin EC 81 MG tablet Take 81 mg by mouth daily.    . benazepril (LOTENSIN) 40 MG tablet Take 1 tablet (40 mg total) by mouth daily. 90 tablet 1  . Cholecalciferol (VITAMIN D3) 50 MCG (2000 UT) capsule Take 2,000 Units by mouth daily.    . methimazole (TAPAZOLE) 5 MG tablet Take by mouth.    . metoprolol succinate (TOPROL-XL) 50 MG 24 hr tablet Take 1 tablet (50 mg total) by mouth daily. 90 tablet 1  . Multiple Vitamin (MULTI-VITAMINS) TABS Take 1 tablet by mouth daily.     Marland Kitchen OVER THE COUNTER MEDICATION Take 4 capsules by mouth in the morning and at bedtime. Omega 3, resveratrol, icariin, curcumin  (Relief Factor)    . Probiotic Product (PROBIOTIC ADVANCED PO) Take 1 capsule by mouth daily.     . silver sulfADIAZINE (SILVADENE) 1 % cream Apply 1 application topically daily. (Patient taking differently: Apply 1 application topically daily as needed (burn). ) 50 g 0  . Sodium Hyaluronate, oral, (HYALURONIC ACID PO) Take 1 tablet by mouth daily.    Marland Kitchen ibuprofen (ADVIL) 800 MG tablet Take 1 tablet (800 mg total) by mouth every 8 (eight) hours as needed. 30 tablet 0  . levothyroxine (SYNTHROID, LEVOTHROID) 25 MCG tablet Take 25 mcg by mouth daily before breakfast.      No current facility-administered medications for this visit.    OBJECTIVE: Vitals:   11/18/19 1107  BP: 119/72  Pulse: 97  Resp: 19  Temp: 98.5 F (36.9 C)  SpO2: 99%     Body mass index is 30.34 kg/m.    ECOG FS:0 - Asymptomatic  General: Well-developed, well-nourished, no acute distress. Eyes: Pink conjunctiva, anicteric sclera. HEENT: Normocephalic, moist mucous membranes. Lungs: No audible wheezing or coughing. Heart: Regular rate and rhythm. Abdomen: Soft, nontender, no obvious  distention. Musculoskeletal: No edema, cyanosis, or clubbing. Neuro: Alert, answering all questions appropriately. Cranial nerves grossly intact. Skin: No rashes or petechiae noted. Psych: Normal affect. Lymphatics: No cervical, calvicular, axillary or inguinal LAD.   LAB RESULTS:  Lab Results  Component Value Date   NA 132 (L) 10/30/2019   K 3.9 10/30/2019   CL 100 10/30/2019   CO2 26 10/30/2019   GLUCOSE 138 (H) 10/30/2019   BUN 11 10/30/2019   CREATININE 0.42 (L) 10/30/2019   CALCIUM 8.7 (L) 10/30/2019   PROT 6.6 10/22/2019   ALBUMIN 4.0 10/22/2019   AST 25 10/22/2019   ALT 38 10/22/2019   ALKPHOS 70 10/22/2019   BILITOT 0.7 10/22/2019   GFRNONAA >60 10/30/2019   GFRAA >60 10/30/2019    Lab Results  Component Value Date   WBC 12.9 (H) 10/30/2019   NEUTROABS 4.9 10/22/2019   HGB 11.3 (L) 10/30/2019   HCT 32.1 (L) 10/30/2019  MCV 85.8 10/30/2019   PLT 232 10/30/2019     STUDIES: DG Chest 1 View  Result Date: 10/28/2019 CLINICAL DATA:  Fluid overload, history breast cancer, hypertension EXAM: CHEST  1 VIEW COMPARISON:  Chest radiograph 05/06/2009, CT chest abdomen pelvis 10/27/2019 FINDINGS: Upper normal size of cardiac silhouette. Mediastinal contours and pulmonary vascularity normal. Lungs clear. No pulmonary infiltrate, pleural effusion or pneumothorax. Bones demineralized. Free air into the RIGHT hemidiaphragm, patient post robot assisted laparoscopic partial colectomy today. IMPRESSION: No acute abnormalities. Electronically Signed   By: Lavonia Dana M.D.   On: 10/28/2019 15:34   CT Chest W Contrast  Result Date: 10/28/2019 CLINICAL DATA:  Staging of hepatic flexure colon adenocarcinoma. History of right breast cancer with lumpectomy 12 years ago. EXAM: CT CHEST, ABDOMEN, AND PELVIS WITH CONTRAST TECHNIQUE: Multidetector CT imaging of the chest, abdomen and pelvis was performed following the standard protocol during bolus administration of intravenous contrast.  CONTRAST:  17m OMNIPAQUE IOHEXOL 300 MG/ML  SOLN COMPARISON:  Chest CT from 07/29/2009 FINDINGS: CT CHEST FINDINGS Cardiovascular: Aortic valve calcification. Mild atherosclerotic calcification of the aortic arch. Mediastinum/Nodes: Unremarkable Lungs/Pleura: A 3 mm left lower lobe pulmonary nodule on image 109/4 was probably present on 07/29/2009 although somewhat blurred by motion artifact on that exam. Likewise a 2 mm left lower lobe nodule on image 89/4 was probably present on 07/29/2009 although slightly more indistinct. Musculoskeletal: Degenerative glenohumeral arthropathy bilaterally. Chondrocalcinosis in the left glenohumeral joint. Thoracic spondylosis. CT ABDOMEN PELVIS FINDINGS Hepatobiliary: 1.2 by 0.9 cm hypodense lesion in segment 2 of the liver on image 44/2 is stable from 07/29/2009 and highly likely to be a benign lesion such as cyst. A 0.7 cm hypodense lesion inferiorly in the right hepatic lobe on image 58/2 is technically too small to characterize. There is previously a larger cyst in the right hepatic lobe near the base of the caudate lobe on the 2011 exam, this appears to have resolved. Gallbladder absent. Pancreas: Unremarkable Spleen: Unremarkable Adrenals/Urinary Tract: Unremarkable Stomach/Bowel: Transverse, descending, and sigmoid colon diverticulosis. The hepatic flexure mass is difficult to perceive due to a similar density of contrast in the colon, but is thought to be present in the vicinity of image 66/2. Regional paracolic lymph nodes measure up to 7 mm in diameter. Vascular/Lymphatic: Aortoiliac atherosclerotic vascular disease. Reproductive: Unremarkable Other: No supplemental non-categorized findings. Musculoskeletal: Bilateral degenerative hip arthropathy with mild chondrocalcinosis. IMPRESSION: 1. The hepatic flexure mass is difficult to perceive due to a similar density of contrast in the colon, but is thought to be present in the vicinity of image 66/2. Regional paracolic  lymph nodes measure up to 7 mm in diameter. No compelling findings of distant metastatic disease. 2. Two tiny pulmonary nodules in the left lower lobe are probably present on 07/29/2009 although slightly more indistinct on today's exam. These are probably benign but merit surveillance. 3. Stable cyst in segment 2 of the liver. A 0.7 cm hypodense lesion inferiorly in the right hepatic lobe due to small size. 4. Other imaging findings of potential clinical significance: Aortic valve calcification. Degenerative glenohumeral arthropathy bilaterally with mild chondrocalcinosis in the left glenohumeral joint. Transverse, descending, and sigmoid colon diverticulosis. 5. Aortic atherosclerosis. Aortic Atherosclerosis (ICD10-I70.0). Electronically Signed   By: WVan ClinesM.D.   On: 10/28/2019 12:07   CT Abdomen Pelvis W Contrast  Result Date: 10/28/2019 CLINICAL DATA:  Staging of hepatic flexure colon adenocarcinoma. History of right breast cancer with lumpectomy 12 years ago. EXAM: CT CHEST, ABDOMEN, AND PELVIS  WITH CONTRAST TECHNIQUE: Multidetector CT imaging of the chest, abdomen and pelvis was performed following the standard protocol during bolus administration of intravenous contrast. CONTRAST:  136m OMNIPAQUE IOHEXOL 300 MG/ML  SOLN COMPARISON:  Chest CT from 07/29/2009 FINDINGS: CT CHEST FINDINGS Cardiovascular: Aortic valve calcification. Mild atherosclerotic calcification of the aortic arch. Mediastinum/Nodes: Unremarkable Lungs/Pleura: A 3 mm left lower lobe pulmonary nodule on image 109/4 was probably present on 07/29/2009 although somewhat blurred by motion artifact on that exam. Likewise a 2 mm left lower lobe nodule on image 89/4 was probably present on 07/29/2009 although slightly more indistinct. Musculoskeletal: Degenerative glenohumeral arthropathy bilaterally. Chondrocalcinosis in the left glenohumeral joint. Thoracic spondylosis. CT ABDOMEN PELVIS FINDINGS Hepatobiliary: 1.2 by 0.9 cm  hypodense lesion in segment 2 of the liver on image 44/2 is stable from 07/29/2009 and highly likely to be a benign lesion such as cyst. A 0.7 cm hypodense lesion inferiorly in the right hepatic lobe on image 58/2 is technically too small to characterize. There is previously a larger cyst in the right hepatic lobe near the base of the caudate lobe on the 2011 exam, this appears to have resolved. Gallbladder absent. Pancreas: Unremarkable Spleen: Unremarkable Adrenals/Urinary Tract: Unremarkable Stomach/Bowel: Transverse, descending, and sigmoid colon diverticulosis. The hepatic flexure mass is difficult to perceive due to a similar density of contrast in the colon, but is thought to be present in the vicinity of image 66/2. Regional paracolic lymph nodes measure up to 7 mm in diameter. Vascular/Lymphatic: Aortoiliac atherosclerotic vascular disease. Reproductive: Unremarkable Other: No supplemental non-categorized findings. Musculoskeletal: Bilateral degenerative hip arthropathy with mild chondrocalcinosis. IMPRESSION: 1. The hepatic flexure mass is difficult to perceive due to a similar density of contrast in the colon, but is thought to be present in the vicinity of image 66/2. Regional paracolic lymph nodes measure up to 7 mm in diameter. No compelling findings of distant metastatic disease. 2. Two tiny pulmonary nodules in the left lower lobe are probably present on 07/29/2009 although slightly more indistinct on today's exam. These are probably benign but merit surveillance. 3. Stable cyst in segment 2 of the liver. A 0.7 cm hypodense lesion inferiorly in the right hepatic lobe due to small size. 4. Other imaging findings of potential clinical significance: Aortic valve calcification. Degenerative glenohumeral arthropathy bilaterally with mild chondrocalcinosis in the left glenohumeral joint. Transverse, descending, and sigmoid colon diverticulosis. 5. Aortic atherosclerosis. Aortic Atherosclerosis  (ICD10-I70.0). Electronically Signed   By: WVan ClinesM.D.   On: 10/28/2019 12:07    ASSESSMENT: Stage IIIb right colon cancer  PLAN:    1. Stage IIIb right colon cancer: Pathology results reviewed, confirming stage of disease.  CT scan results from Oct 28, 2019 reviewed independently and reported as above with no obvious evidence of metastatic disease.  Patient will benefit from adjuvant FOLFOX chemotherapy every 2 weeks for a total of 12 cycles.  Prior to initiating treatment she will require port placement.  Return to clinic on November 30, 2019 to initiate cycle 1 of 12 of FOLFOX. 2.  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  She completed 5 years of anastrozole in June 2016.  Her most recent mammogram on February 02, 2019 was reported as BI-RADS 1.  Repeat in August 2021.  I spent a total of 60 minutes reviewing chart data, face-to-face evaluation with the patient, counseling and coordination of care as detailed above.   Patient expressed understanding and was  in agreement with this plan. She also understands that She can call clinic at any time with any questions, concerns, or complaints.   Cancer Staging Colon cancer [facility]) Staging form: Colon and Rectum, AJCC 8th Edition - Clinical stage from 11/13/2019: Stage IIIB (cT3, cN1a, cM0) - Signed by Lloyd Huger, MD on 11/13/2019   Lloyd Huger, MD   11/19/2019 8:45 AM

## 2019-11-17 ENCOUNTER — Ambulatory Visit (INDEPENDENT_AMBULATORY_CARE_PROVIDER_SITE_OTHER): Payer: Self-pay | Admitting: Surgery

## 2019-11-17 ENCOUNTER — Encounter: Payer: Self-pay | Admitting: Surgery

## 2019-11-17 ENCOUNTER — Other Ambulatory Visit: Payer: Self-pay

## 2019-11-17 VITALS — BP 121/71 | HR 104 | Temp 97.7°F | Resp 12 | Wt 170.4 lb

## 2019-11-17 DIAGNOSIS — Z9049 Acquired absence of other specified parts of digestive tract: Secondary | ICD-10-CM

## 2019-11-17 NOTE — H&P (View-Only) (Signed)
[facility] POST-OP OFFICE VISIT  11/17/2019  HPI: Maria Davies is a 79 y.o. female 20 days s/p robotic right hemicolectomy.  She appears well, reports a good appetite.  She states she has relatively well formed bowel movements twice daily and denies seeing any blood in them.  Little to minimal incisional discomfort.  She denies any fevers, chills, nausea or vomiting.  She reports no history of drainage or redness from any of her incisions.  She has an appointment with Dr. Grayland Ormond tomorrow.  Vital signs: BP 121/71   Pulse (!) 104   Temp 97.7 F (36.5 C)   Resp 12   Wt 170 lb 6.4 oz (77.3 kg)   SpO2 98%   BMI 30.19 kg/m    Physical Exam: Constitutional: She appears quite well. Abdomen: Soft and nontender.  No masses. Skin: Incisions are all clean, dry and intact.  There are no masses present in any of the incisions.  There is no induration or erythema present.  Assessment/Plan: This is a 79 y.o. female 20 days s/p robotic right hemicolectomy.  SURGICAL PATHOLOGY  * THIS IS AN ADDENDUM REPORT *  CASE: [id number]  PATIENT: Maria Davies  Surgical Pathology Report  *Addendum *   Reason for Addendum #1: DNA Mismatch Repair IHC Results   Specimen Submitted:  A. Colon, right   Clinical History: Right colon cancer, mass colon     DIAGNOSIS:  A. COLON, RIGHT; PARTIAL COLECTOMY:  - INVASIVE MODERATELY DIFFERENTIATED ADENOCARCINOMA.  - INCIDENTAL TUBULAR ADENOMA.  - DIVERTICULOSIS.  - ONE OF TWENTY-SEVEN LYMPH NODES INVOLVED BY METASTATIC ADENOCARCINOMA  (1/27).  - SEE CANCER SUMMARY.   CANCER CASE SUMMARY: COLON AND RECTUM  Procedure: Right hemicolectomy  Tumor Site: Right (ascending) colon  Tumor Size: Greatest dimension: 3.0 cm  Macroscopic Tumor Perforation: Not identified  Histologic Type: Adenocarcinoma  Histologic Grade: G2: Moderately differentiated  Tumor Extension: Tumor invades through muscularis propria into  pericolorectal tissue   Margins: All margins are uninvolved by invasive carcinoma, high-grade  dysplasia/intramucosal adenocarcinoma, and low-grade dysplasia  Treatment Effect: No known presurgical therapy  Lymphovascular Invasion: Present  Perineural Invasion: Not identified  Tumor Deposits: Present: 1  Regional Lymph Nodes: Number of Lymph Nodes Involved: 1  Number of Lymph Nodes Examined: 27   Pathologic Stage Classification (pTNM, AJCC 8th Edition): pT3 pN1a  TNM Descriptors:   Results of immunohistochemistry for MMR (mismatch repair proteins) will  be resulted in an addendum.     GROSS DESCRIPTION:  A. Labeled: Right colon  Received: Formalin  Type of procedure: Right partial colectomy  Measurements: Terminal ileum - 5.2 cm in length by 2.5 cm in  circumference. Cecum/ascending colon - 22.5 cm in length and ranging  from 4.0 to 8.5 cm in circumference with attached pericolonic fat up to  2.0 cm in thickness. The appendix is surgically absent.  Specimen integrity: The specimen is received intact with both intestinal  surgical resection margins stapled closed.  Orientation: The specimen is received with no orientating material;  however, is able to be oriented at the time of grossing.  Number of mass(es): 1  Location of mass(es): The mass involves the distal aspect of the  ascending colonic mucosa.  Size of mass(es): 3.0 x 3.0 x 0.5 cm  Description of mass(es): The mass is tan and exophytic with central  ulceration and raised heaped edges. The mass is partially  circumferential and involves the antimesenteric and mesenteric aspect of  the lumen. The associated serosa displays black discoloration  (  suspicious for tattoo ink) and slight puckering and adhesions; over  inked black. The uninvolved ascending colonic mucosa proximal and distal  to the mass displays 2 focal areas of black discoloration (suspicious  for tattoo sites) ranging from 1.5 to 2.5 cm in greatest dimension.  Bowel  circumference at site of mass(es): 5.0 cm  Gross depth of invasion: The mass grossly appears to involve the  underlying intestinal wall with possible extension into the pericolonic  fat.  Macroscopic perforation: No distinct, associated site of perforation is  grossly identified.  Margins:    Proximal terminal ileum resection margin - 14.5 cm    Distal ascending colonic resection margin - 8.2 cm    Closest pericolonic fat resection margin - 3.2 cm  Luminal obstruction: The mass is partially circumferential involving  approximately 75% of the luminal circumference with possible associated  luminal obstruction.  Other remarkable findings: The ascending colonic mucosa distal to the  mass displays a 0.2 x 0.2 cm tan polyp. The polyp grossly appears to be  confined to the mucosa and comes within 4.0 cm of the closest intestinal  surgical resection margin (distal). The ascending colonic mucosa distal  to the mass additionally displays multiple diverticula with no  associated perforation or abscess formation. The remaining, uninvolved  cecal/ascending colonic mucosa is tan with normal intestinal folding.  No additional abnormalitiesor mass lesions are grossly identified. The  terminal ileum mucosa is tan with normal intestinal folding. The  uninvolved intestinal serosa is tan and smooth. The appendix is  surgically absent.  Lymph nodes: A thorough lymph node dissection performed on the  pericolonic fat reveals 34 lymph node candidates ranging from 0.2 to 1.6  cm in greatest dimension. One of the lymph nodes is suspicious for  being grossly positive.   Block summary:  1 - proximal terminal ileum resection margin (en face)  2 - distal ascending colonic resection margin (en face)  3 - pericolonic fat resection margin closest to mass (en face)  4-8 - mass displaying deepest level of invasion (black discoloration due  to distally located tattoo ink in cassettes 4-5)  9-12 -  mass in relation to serosal pericolonic fat junction and puckered  serosa  13 - mass in relation to uninvolved ascending colonic mucosa  14 - uninvolved ascending colonic mucosa displaying black discoloration  due to proximally located tattooink  15 - entire, intact ascending colonic polyp  16-17 - ascending colonic mucosa displaying diverticula  18 - grossly normal cecum  19 - grossly normal terminal ileum  20-24 - 5, intact lymph nodes per block (25 total)  25 - 2, intact lymph nodes  26-27 - 2, bisected lymph nodes per block (1 differentially inked blue;  4 total)  28-29 - 1, trisected lymph node per block (2 total)  30-31 - 1, serially sectioned lymph node grossly suspicious for being  positive   Final Diagnosis performed by Allena Napoleon, MD.  Electronically signed  10/30/2019 1:12:11PM  The electronic signature indicates that the named Attending Pathologist  has evaluated the specimen  Technical component performed at [facility], [address] [place],  [place] [address] Lab: [phone] Dir: Rush Farmer, MD, MMM  Professional component performed at [facility], [facility], [place], [place], [place] [address] Lab: [phone]  Dir: Dellia Nims. Reuel Derby, MD   ADDENDUM:  Immunohistochemistry (IHC) Testing for DNA Mismatch Repair (MMR)  Proteins:  Results:  MLH1: Intact nuclear expression  MSH2: Intact nuclear expression  MSH6: Intact nuclear  expression  PMS2: Intact nuclear expression  IHC Interpretation: No loss of nuclear expression of MMR proteins: Low  probability of MSI-H.   Intact expression of all 4 proteins indicates that MMR enzymes tested  are intact but does not entirely exclude Lynch syndrome, as  approximately 5% of families have a missense mutation (especially in  [place]) that can lead to a nonfunctional protein with retained  antigenicity. If there is strong clinical suspicion for hereditary  cancer, then additional genetic testing can  be considered.   Testing was performed on block: A7  IHC slides were prepared by [facility], [place], [place], and interpreted by Dr. Allena Napoleon.  All controls stained appropriately.   Patient Active Problem List   Diagnosis Date Noted  . Colon cancer ([place]) 10/28/2019  . Iron deficiency anemia secondary to blood loss (chronic)   . Polyp of sigmoid colon   . Benign neoplasm of cecum   . Intestines neoplasm   . Iron deficiency anemia due to chronic blood loss   . Stricture and stenosis of esophagus   . Pre-diabetes 08/26/2018  . Advanced care planning/counseling discussion 07/22/2017  . Hypercholesteremia 07/03/2016  . Osteoporosis 07/03/2016  . Carcinoma of right breast ([place]) 06/19/2016  . Aortic stenosis, mild 05/02/2015  . Hypertension 05/02/2015  . Hypothyroid 05/02/2015  . Breast cancer in female [facility]) 05/02/2015    -She will see Dr. Grayland Ormond tomorrow and review the plan for her treatment.  I remain readily available should she need Port-A-Cath placement.  I will not schedule a another follow-up visit, knowing that she will likely be closely followed during her treatment to come.   Ronny Bacon M.D., FACS 11/17/2019, 9:23 AM

## 2019-11-17 NOTE — Progress Notes (Signed)
[facility] POST-OP OFFICE VISIT  11/17/2019  HPI: Maria Davies is a 79 y.o. female 20 days s/p robotic right hemicolectomy.  She appears well, reports a good appetite.  She states she has relatively well formed bowel movements twice daily and denies seeing any blood in them.  Little to minimal incisional discomfort.  She denies any fevers, chills, nausea or vomiting.  She reports no history of drainage or redness from any of her incisions.  She has an appointment with Dr. Grayland Ormond tomorrow.  Vital signs: BP 121/71   Pulse (!) 104   Temp 97.7 F (36.5 C)   Resp 12   Wt 170 lb 6.4 oz (77.3 kg)   SpO2 98%   BMI 30.19 kg/m    Physical Exam: Constitutional: She appears quite well. Abdomen: Soft and nontender.  No masses. Skin: Incisions are all clean, dry and intact.  There are no masses present in any of the incisions.  There is no induration or erythema present.  Assessment/Plan: This is a 79 y.o. female 20 days s/p robotic right hemicolectomy.  SURGICAL PATHOLOGY  * THIS IS AN ADDENDUM REPORT *  CASE: [id number]  PATIENT: Maria Davies  Surgical Pathology Report  *Addendum *   Reason for Addendum #1: DNA Mismatch Repair IHC Results   Specimen Submitted:  A. Colon, right   Clinical History: Right colon cancer, mass colon     DIAGNOSIS:  A. COLON, RIGHT; PARTIAL COLECTOMY:  - INVASIVE MODERATELY DIFFERENTIATED ADENOCARCINOMA.  - INCIDENTAL TUBULAR ADENOMA.  - DIVERTICULOSIS.  - ONE OF TWENTY-SEVEN LYMPH NODES INVOLVED BY METASTATIC ADENOCARCINOMA  (1/27).  - SEE CANCER SUMMARY.   CANCER CASE SUMMARY: COLON AND RECTUM  Procedure: Right hemicolectomy  Tumor Site: Right (ascending) colon  Tumor Size: Greatest dimension: 3.0 cm  Macroscopic Tumor Perforation: Not identified  Histologic Type: Adenocarcinoma  Histologic Grade: G2: Moderately differentiated  Tumor Extension: Tumor invades through muscularis propria into  pericolorectal tissue   Margins: All margins are uninvolved by invasive carcinoma, high-grade  dysplasia/intramucosal adenocarcinoma, and low-grade dysplasia  Treatment Effect: No known presurgical therapy  Lymphovascular Invasion: Present  Perineural Invasion: Not identified  Tumor Deposits: Present: 1  Regional Lymph Nodes: Number of Lymph Nodes Involved: 1  Number of Lymph Nodes Examined: 27   Pathologic Stage Classification (pTNM, AJCC 8th Edition): pT3 pN1a  TNM Descriptors:   Results of immunohistochemistry for MMR (mismatch repair proteins) will  be resulted in an addendum.     GROSS DESCRIPTION:  A. Labeled: Right colon  Received: Formalin  Type of procedure: Right partial colectomy  Measurements: Terminal ileum - 5.2 cm in length by 2.5 cm in  circumference. Cecum/ascending colon - 22.5 cm in length and ranging  from 4.0 to 8.5 cm in circumference with attached pericolonic fat up to  2.0 cm in thickness. The appendix is surgically absent.  Specimen integrity: The specimen is received intact with both intestinal  surgical resection margins stapled closed.  Orientation: The specimen is received with no orientating material;  however, is able to be oriented at the time of grossing.  Number of mass(es): 1  Location of mass(es): The mass involves the distal aspect of the  ascending colonic mucosa.  Size of mass(es): 3.0 x 3.0 x 0.5 cm  Description of mass(es): The mass is tan and exophytic with central  ulceration and raised heaped edges. The mass is partially  circumferential and involves the antimesenteric and mesenteric aspect of  the lumen. The associated serosa displays black discoloration  (  suspicious for tattoo ink) and slight puckering and adhesions; over  inked black. The uninvolved ascending colonic mucosa proximal and distal  to the mass displays 2 focal areas of black discoloration (suspicious  for tattoo sites) ranging from 1.5 to 2.5 cm in greatest dimension.  Bowel  circumference at site of mass(es): 5.0 cm  Gross depth of invasion: The mass grossly appears to involve the  underlying intestinal wall with possible extension into the pericolonic  fat.  Macroscopic perforation: No distinct, associated site of perforation is  grossly identified.  Margins:    Proximal terminal ileum resection margin - 14.5 cm    Distal ascending colonic resection margin - 8.2 cm    Closest pericolonic fat resection margin - 3.2 cm  Luminal obstruction: The mass is partially circumferential involving  approximately 75% of the luminal circumference with possible associated  luminal obstruction.  Other remarkable findings: The ascending colonic mucosa distal to the  mass displays a 0.2 x 0.2 cm tan polyp. The polyp grossly appears to be  confined to the mucosa and comes within 4.0 cm of the closest intestinal  surgical resection margin (distal). The ascending colonic mucosa distal  to the mass additionally displays multiple diverticula with no  associated perforation or abscess formation. The remaining, uninvolved  cecal/ascending colonic mucosa is tan with normal intestinal folding.  No additional abnormalitiesor mass lesions are grossly identified. The  terminal ileum mucosa is tan with normal intestinal folding. The  uninvolved intestinal serosa is tan and smooth. The appendix is  surgically absent.  Lymph nodes: A thorough lymph node dissection performed on the  pericolonic fat reveals 34 lymph node candidates ranging from 0.2 to 1.6  cm in greatest dimension. One of the lymph nodes is suspicious for  being grossly positive.   Block summary:  1 - proximal terminal ileum resection margin (en face)  2 - distal ascending colonic resection margin (en face)  3 - pericolonic fat resection margin closest to mass (en face)  4-8 - mass displaying deepest level of invasion (black discoloration due  to distally located tattoo ink in cassettes 4-5)  9-12 -  mass in relation to serosal pericolonic fat junction and puckered  serosa  13 - mass in relation to uninvolved ascending colonic mucosa  14 - uninvolved ascending colonic mucosa displaying black discoloration  due to proximally located tattooink  15 - entire, intact ascending colonic polyp  16-17 - ascending colonic mucosa displaying diverticula  18 - grossly normal cecum  19 - grossly normal terminal ileum  20-24 - 5, intact lymph nodes per block (25 total)  25 - 2, intact lymph nodes  26-27 - 2, bisected lymph nodes per block (1 differentially inked blue;  4 total)  28-29 - 1, trisected lymph node per block (2 total)  30-31 - 1, serially sectioned lymph node grossly suspicious for being  positive   Final Diagnosis performed by Allena Napoleon, MD.  Electronically signed  10/30/2019 1:12:11PM  The electronic signature indicates that the named Attending Pathologist  has evaluated the specimen  Technical component performed at [facility], [address] [place],  [place] [address] Lab: [phone] Dir: Rush Farmer, MD, MMM  Professional component performed at [facility], [facility], [place], [place], [place] [address] Lab: [phone]  Dir: Dellia Nims. Reuel Derby, MD   ADDENDUM:  Immunohistochemistry (IHC) Testing for DNA Mismatch Repair (MMR)  Proteins:  Results:  MLH1: Intact nuclear expression  MSH2: Intact nuclear expression  MSH6: Intact nuclear  expression  PMS2: Intact nuclear expression  IHC Interpretation: No loss of nuclear expression of MMR proteins: Low  probability of MSI-H.   Intact expression of all 4 proteins indicates that MMR enzymes tested  are intact but does not entirely exclude Lynch syndrome, as  approximately 5% of families have a missense mutation (especially in  [place]) that can lead to a nonfunctional protein with retained  antigenicity. If there is strong clinical suspicion for hereditary  cancer, then additional genetic testing can  be considered.   Testing was performed on block: A7  IHC slides were prepared by [facility], [place], [place], and interpreted by Dr. Allena Napoleon.  All controls stained appropriately.   Patient Active Problem List   Diagnosis Date Noted  . Colon cancer ([place]) 10/28/2019  . Iron deficiency anemia secondary to blood loss (chronic)   . Polyp of sigmoid colon   . Benign neoplasm of cecum   . Intestines neoplasm   . Iron deficiency anemia due to chronic blood loss   . Stricture and stenosis of esophagus   . Pre-diabetes 08/26/2018  . Advanced care planning/counseling discussion 07/22/2017  . Hypercholesteremia 07/03/2016  . Osteoporosis 07/03/2016  . Carcinoma of right breast ([place]) 06/19/2016  . Aortic stenosis, mild 05/02/2015  . Hypertension 05/02/2015  . Hypothyroid 05/02/2015  . Breast cancer in female [facility]) 05/02/2015    -She will see Dr. Grayland Ormond tomorrow and review the plan for her treatment.  I remain readily available should she need Port-A-Cath placement.  I will not schedule a another follow-up visit, knowing that she will likely be closely followed during her treatment to come.   Ronny Bacon M.D., FACS 11/17/2019, 9:23 AM

## 2019-11-17 NOTE — Patient Instructions (Addendum)
Follow up as needed. Call the office if you have any questions or concerns.   GENERAL POST-OPERATIVE PATIENT INSTRUCTIONS   WOUND CARE INSTRUCTIONS:  Keep a dry clean dressing on the wound if there is drainage. The initial bandage may be removed after 24 hours.  Once the wound has quit draining you may leave it open to air.  If clothing rubs against the wound or causes irritation and the wound is not draining you may cover it with a dry dressing during the daytime.  Try to keep the wound dry and avoid ointments on the wound unless directed to do so.  If the wound becomes bright red and painful or starts to drain infected material that is not clear, please contact your physician immediately.  If the wound is mildly pink and has a thick firm ridge underneath it, this is normal, and is referred to as a healing ridge.  This will resolve over the next 4-6 weeks.  BATHING: You may shower if you have been informed of this by your surgeon. However, Please do not submerge in a tub, hot tub, or pool until incisions are completely sealed or have been told by your surgeon that you may do so.  DIET:  You may eat any foods that you can tolerate.  It is a good idea to eat a high fiber diet and take in plenty of fluids to prevent constipation.  If you do become constipated you may want to take a mild laxative or take ducolax tablets on a daily basis until your bowel habits are regular.  Constipation can be very uncomfortable, along with straining, after recent surgery.  ACTIVITY:  You are encouraged to cough and deep breath or use your incentive spirometer if you were given one, every 15-30 minutes when awake.  This will help prevent respiratory complications and low grade fevers post-operatively if you had a general anesthetic.  You may want to hug a pillow when coughing and sneezing to add additional support to the surgical area, if you had abdominal or chest surgery, which will decrease pain during these times.  You  are encouraged to walk and engage in light activity for the next two weeks.  You should not lift more than 20 pounds, until 12/09/19 as it could put you at increased risk for complications.  Twenty pounds is roughly equivalent to a plastic bag of groceries. At that time- Listen to your body when lifting, if you have pain when lifting, stop and then try again in a few days. Soreness after doing exercises or activities of daily living is normal as you get back in to your normal routine.  MEDICATIONS:  Try to take narcotic medications and anti-inflammatory medications, such as tylenol, ibuprofen, naprosyn, etc., with food.  This will minimize stomach upset from the medication.  Should you develop nausea and vomiting from the pain medication, or develop a rash, please discontinue the medication and contact your physician.  You should not drive, make important decisions, or operate machinery when taking narcotic pain medication.  SUNBLOCK Use sun block to incision area over the next year if this area will be exposed to sun. This helps decrease scarring and will allow you avoid a permanent darkened area over your incision.  QUESTIONS:  Please feel free to call our office if you have any questions, and we will be glad to assist you.

## 2019-11-18 ENCOUNTER — Encounter: Payer: Self-pay | Admitting: Oncology

## 2019-11-18 ENCOUNTER — Inpatient Hospital Stay: Payer: Medicare Other | Attending: Oncology | Admitting: Oncology

## 2019-11-18 ENCOUNTER — Inpatient Hospital Stay: Payer: Medicare Other

## 2019-11-18 ENCOUNTER — Other Ambulatory Visit: Payer: Self-pay

## 2019-11-18 VITALS — BP 119/72 | HR 97 | Temp 98.5°F | Resp 19 | Wt 171.3 lb

## 2019-11-18 DIAGNOSIS — C189 Malignant neoplasm of colon, unspecified: Secondary | ICD-10-CM

## 2019-11-18 DIAGNOSIS — I700 Atherosclerosis of aorta: Secondary | ICD-10-CM | POA: Insufficient documentation

## 2019-11-18 DIAGNOSIS — Z79899 Other long term (current) drug therapy: Secondary | ICD-10-CM | POA: Insufficient documentation

## 2019-11-18 DIAGNOSIS — Z85820 Personal history of malignant melanoma of skin: Secondary | ICD-10-CM | POA: Insufficient documentation

## 2019-11-18 DIAGNOSIS — D509 Iron deficiency anemia, unspecified: Secondary | ICD-10-CM | POA: Insufficient documentation

## 2019-11-18 DIAGNOSIS — Z7982 Long term (current) use of aspirin: Secondary | ICD-10-CM | POA: Insufficient documentation

## 2019-11-18 DIAGNOSIS — M8580 Other specified disorders of bone density and structure, unspecified site: Secondary | ICD-10-CM | POA: Insufficient documentation

## 2019-11-18 DIAGNOSIS — C182 Malignant neoplasm of ascending colon: Secondary | ICD-10-CM

## 2019-11-18 DIAGNOSIS — Z7189 Other specified counseling: Secondary | ICD-10-CM | POA: Diagnosis not present

## 2019-11-18 DIAGNOSIS — Z923 Personal history of irradiation: Secondary | ICD-10-CM | POA: Insufficient documentation

## 2019-11-18 DIAGNOSIS — Z452 Encounter for adjustment and management of vascular access device: Secondary | ICD-10-CM

## 2019-11-18 DIAGNOSIS — Z5111 Encounter for antineoplastic chemotherapy: Secondary | ICD-10-CM | POA: Diagnosis not present

## 2019-11-18 DIAGNOSIS — Z809 Family history of malignant neoplasm, unspecified: Secondary | ICD-10-CM | POA: Diagnosis not present

## 2019-11-18 DIAGNOSIS — D72829 Elevated white blood cell count, unspecified: Secondary | ICD-10-CM | POA: Diagnosis not present

## 2019-11-18 DIAGNOSIS — Z9221 Personal history of antineoplastic chemotherapy: Secondary | ICD-10-CM | POA: Insufficient documentation

## 2019-11-18 DIAGNOSIS — I10 Essential (primary) hypertension: Secondary | ICD-10-CM | POA: Insufficient documentation

## 2019-11-18 DIAGNOSIS — Z853 Personal history of malignant neoplasm of breast: Secondary | ICD-10-CM | POA: Insufficient documentation

## 2019-11-18 DIAGNOSIS — E039 Hypothyroidism, unspecified: Secondary | ICD-10-CM | POA: Diagnosis not present

## 2019-11-18 DIAGNOSIS — K5730 Diverticulosis of large intestine without perforation or abscess without bleeding: Secondary | ICD-10-CM | POA: Insufficient documentation

## 2019-11-19 ENCOUNTER — Ambulatory Visit: Payer: Self-pay | Admitting: Surgery

## 2019-11-19 ENCOUNTER — Telehealth: Payer: Self-pay | Admitting: Surgery

## 2019-11-19 ENCOUNTER — Telehealth: Payer: Self-pay | Admitting: *Deleted

## 2019-11-19 ENCOUNTER — Encounter: Payer: Self-pay | Admitting: *Deleted

## 2019-11-19 DIAGNOSIS — C19 Malignant neoplasm of rectosigmoid junction: Secondary | ICD-10-CM

## 2019-11-19 DIAGNOSIS — Z7189 Other specified counseling: Secondary | ICD-10-CM | POA: Insufficient documentation

## 2019-11-19 MED ORDER — LIDOCAINE-PRILOCAINE 2.5-2.5 % EX CREA: TOPICAL_CREAM | CUTANEOUS | 3 refills | Status: DC

## 2019-11-19 MED ORDER — ONDANSETRON HCL 8 MG PO TABS: 8.0000 mg | ORAL_TABLET | Freq: Two times a day (BID) | ORAL | 2 refills | Status: DC | PRN

## 2019-11-19 MED ORDER — PROCHLORPERAZINE MALEATE 10 MG PO TABS: 10.0000 mg | ORAL_TABLET | Freq: Four times a day (QID) | ORAL | 2 refills | Status: DC | PRN

## 2019-11-19 NOTE — Telephone Encounter (Signed)
Outgoing call made, left message for patient to call.  Please advise patient of Pre-Admission date/time, COVID Testing date and Surgery date.  Surgery Date: 11/23/19 Preadmission Testing Date: 11/23/19 (arrive 2 hours early) Covid Testing Date: 11/20/19 - patient advised to go to the [facility] ([place]) between 8a-1p   Also patient needs to call [phone], between 1-3:00pm the day before surgery, to find out what time to arrive for surgery.

## 2019-11-19 NOTE — Telephone Encounter (Signed)
Outgoing call made again.  No answer.  Called daughter Thea Alken and spoke with her.  She said her mom was playing bridge.  Her daughter Thea Alken is aware of surgery date for 11/23/19 and she is given the number to call the day before to find out exactly what time to arrive.  Since pre-admit said to arrive 2 hours early, time may shift in the OR, so to call just to confirm.  She is also aware of Covid testing for 11/20/19 to have done between 8 am to 1 pm and voices understanding.

## 2019-11-19 NOTE — Telephone Encounter (Signed)
Received notification that zofran was approved by pt's insurance.

## 2019-11-19 NOTE — Progress Notes (Signed)
START ON PATHWAY REGIMEN - Colorectal ? ? ?  A cycle is every 14 days: ?    Oxaliplatin  ?    Leucovorin  ?    Fluorouracil  ?    Fluorouracil  ? ?**Always confirm dose/schedule in your pharmacy ordering system** ? ?Patient Characteristics: ?Postoperative without Neoadjuvant Therapy (Pathologic Staging), Colon, Stage III, Low Risk (pT1-3, pN1) ?Tumor Location: Colon ?Therapeutic Status: Postoperative without Neoadjuvant Therapy (Pathologic Staging) ?AJCC M Category: cM0 ?AJCC T Category: pT3 ?AJCC N Category: pN1a ?AJCC 8 Stage Grouping: IIIB ?Intent of Therapy: ?Curative Intent, Discussed with Patient ?

## 2019-11-19 NOTE — Telephone Encounter (Signed)
PA submitted for EMLA Cream Maria Davies Key: [facility] - Rx #: [id number] (pending authorization).   PA submitted for zofran Maria Davies Key: [id number] - Rx #: [id number] (pending authorization).

## 2019-11-20 ENCOUNTER — Other Ambulatory Visit
Admission: RE | Admit: 2019-11-20 | Discharge: 2019-11-20 | Disposition: A | Discharge status: Home / Self Care | Payer: Medicare Other | Source: Ambulatory Visit | Attending: Surgery | Admitting: Surgery

## 2019-11-20 ENCOUNTER — Other Ambulatory Visit: Payer: Self-pay

## 2019-11-20 DIAGNOSIS — Z01812 Encounter for preprocedural laboratory examination: Secondary | ICD-10-CM | POA: Insufficient documentation

## 2019-11-20 DIAGNOSIS — Z20822 Contact with and (suspected) exposure to covid-19: Secondary | ICD-10-CM | POA: Diagnosis not present

## 2019-11-20 LAB — SARS CORONAVIRUS 2 (TAT 6-24 HRS): SARS Coronavirus 2: NEGATIVE

## 2019-11-23 ENCOUNTER — Other Ambulatory Visit: Payer: Self-pay

## 2019-11-23 ENCOUNTER — Ambulatory Visit: Payer: Medicare Other | Admitting: Certified Registered"

## 2019-11-23 ENCOUNTER — Ambulatory Visit
Admission: RE | Admit: 2019-11-23 | Discharge: 2019-11-23 | Disposition: A | Discharge status: Home / Self Care | Payer: Medicare Other | Attending: Surgery | Admitting: Surgery

## 2019-11-23 ENCOUNTER — Encounter: Payer: Self-pay | Admitting: Surgery

## 2019-11-23 ENCOUNTER — Ambulatory Visit: Payer: Medicare Other

## 2019-11-23 ENCOUNTER — Ambulatory Visit
Admission: RE | Admit: 2019-11-23 | Discharge: 2019-11-23 | Disposition: A | Discharge status: Home / Self Care | Payer: Medicare Other | Source: Home / Self Care | Attending: Surgery | Admitting: Surgery

## 2019-11-23 ENCOUNTER — Encounter
Admission: RE | Disposition: A | Discharge status: Home / Self Care | Payer: Self-pay | Source: Home / Self Care | Attending: Surgery

## 2019-11-23 DIAGNOSIS — I350 Nonrheumatic aortic (valve) stenosis: Secondary | ICD-10-CM | POA: Insufficient documentation

## 2019-11-23 DIAGNOSIS — R7303 Prediabetes: Secondary | ICD-10-CM | POA: Insufficient documentation

## 2019-11-23 DIAGNOSIS — C183 Malignant neoplasm of hepatic flexure: Secondary | ICD-10-CM | POA: Diagnosis not present

## 2019-11-23 DIAGNOSIS — E7800 Pure hypercholesterolemia, unspecified: Secondary | ICD-10-CM | POA: Diagnosis not present

## 2019-11-23 DIAGNOSIS — D500 Iron deficiency anemia secondary to blood loss (chronic): Secondary | ICD-10-CM | POA: Insufficient documentation

## 2019-11-23 DIAGNOSIS — Z853 Personal history of malignant neoplasm of breast: Secondary | ICD-10-CM | POA: Insufficient documentation

## 2019-11-23 DIAGNOSIS — E039 Hypothyroidism, unspecified: Secondary | ICD-10-CM | POA: Insufficient documentation

## 2019-11-23 DIAGNOSIS — C19 Malignant neoplasm of rectosigmoid junction: Secondary | ICD-10-CM | POA: Diagnosis not present

## 2019-11-23 DIAGNOSIS — Z452 Encounter for adjustment and management of vascular access device: Secondary | ICD-10-CM | POA: Diagnosis not present

## 2019-11-23 HISTORY — PX: PORTACATH PLACEMENT: SHX2246

## 2019-11-23 LAB — CBC
HCT: 32.2 % — ABNORMAL LOW (ref 36.0–46.0)
Hemoglobin: 10.9 g/dL — ABNORMAL LOW (ref 12.0–15.0)
MCH: 30.6 pg (ref 26.0–34.0)
MCHC: 33.9 g/dL (ref 30.0–36.0)
MCV: 90.4 fL (ref 80.0–100.0)
Platelets: 242 10*3/uL (ref 150–400)
RBC: 3.56 MIL/uL — ABNORMAL LOW (ref 3.87–5.11)
RDW: 14.6 % (ref 11.5–15.5)
WBC: 6.9 10*3/uL (ref 4.0–10.5)
nRBC: 0 % (ref 0.0–0.2)

## 2019-11-23 LAB — BASIC METABOLIC PANEL
Anion gap: 9 (ref 5–15)
BUN: 5 mg/dL — ABNORMAL LOW (ref 8–23)
CO2: 24 mmol/L (ref 22–32)
Calcium: 8.8 mg/dL — ABNORMAL LOW (ref 8.9–10.3)
Chloride: 109 mmol/L (ref 98–111)
Creatinine, Ser: 0.56 mg/dL (ref 0.44–1.00)
GFR calc Af Amer: >60 mL/min (ref >60)
GFR calc non Af Amer: >60 mL/min (ref >60)
Glucose, Bld: 116 mg/dL — ABNORMAL HIGH (ref 70–99)
Potassium: 3.4 mmol/L — ABNORMAL LOW (ref 3.5–5.1)
Sodium: 142 mmol/L (ref 135–145)

## 2019-11-23 IMAGING — DX DG CHEST 1V PORT
1 series · 1 of 1 positions shown · non-contrast
Comparison: [DATE]

CLINICAL DATA: Port-A-Cath placement

EXAM:
PORTABLE CHEST 1 VIEW

[chest ap]
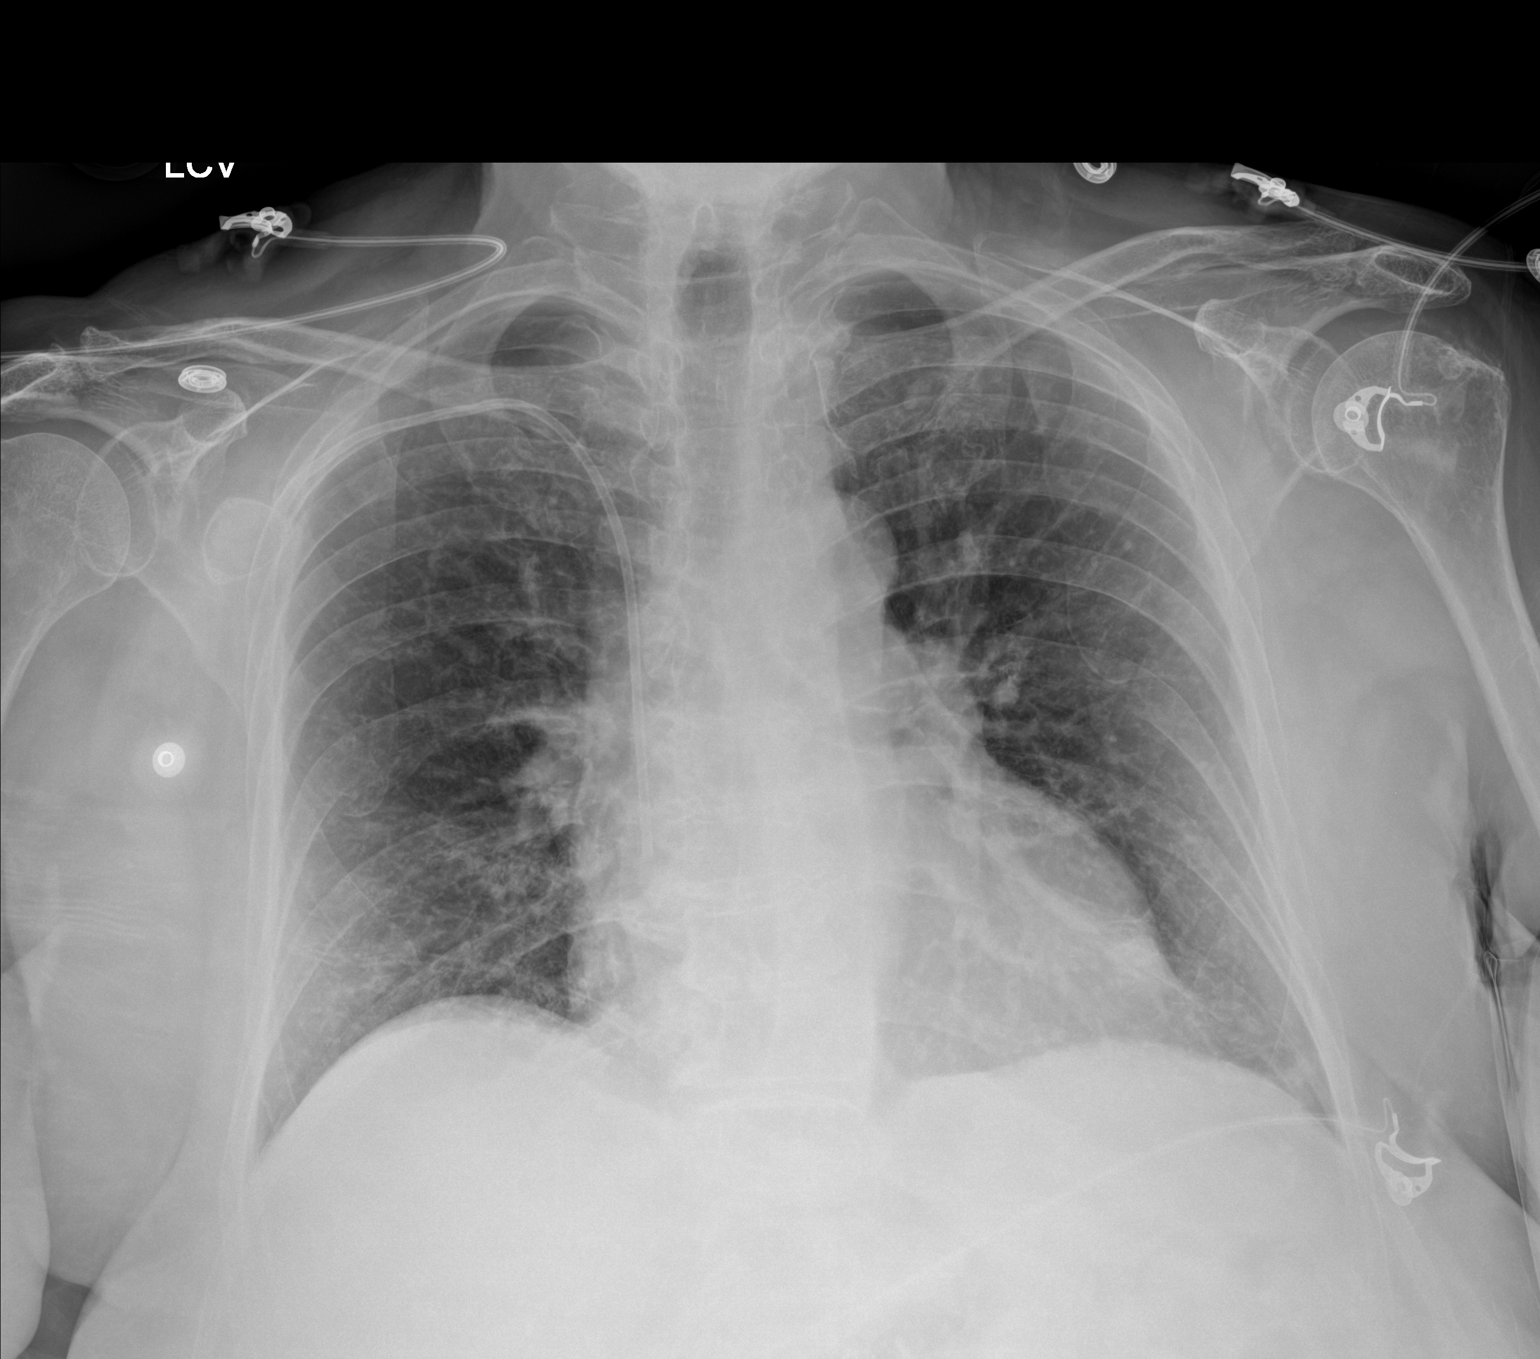

[1 of 1 positions shown; findings below may reference images not displayed]

FINDINGS: Right Port-A-Cath has been placed with the tip at the cavoatrial
junction. No pneumothorax. Heart is normal size. No confluent
opacities or effusions.
IMPRESSION: Right Port-A-Cath placement with the tip at the cavoatrial junction.
No pneumothorax.

## 2019-11-23 SURGERY — INSERTION, TUNNELED CENTRAL VENOUS DEVICE, WITH PORT
Anesthesia: General | Laterality: Right

## 2019-11-23 MED ORDER — CEFAZOLIN SODIUM-DEXTROSE 2-4 GM/100ML-% IV SOLN
2.0000 g | INTRAVENOUS | Status: AC
  Administered 2019-11-23: 2 g via INTRAVENOUS

## 2019-11-23 MED ORDER — FENTANYL CITRATE (PF) 100 MCG/2ML IJ SOLN: 25.0000 ug | INTRAMUSCULAR | Status: DC | PRN

## 2019-11-23 MED ORDER — ORAL CARE MOUTH RINSE: 15.0000 mL | Freq: Once | OROMUCOSAL | Status: AC

## 2019-11-23 MED ORDER — FAMOTIDINE 20 MG PO TABS
ORAL_TABLET | ORAL | Status: AC
  Administered 2019-11-23: 20 mg via ORAL
  Filled 2019-11-23: qty 1

## 2019-11-23 MED ORDER — HEPARIN SOD (PORK) LOCK FLUSH 100 UNIT/ML IV SOLN
INTRAVENOUS | Status: DC | PRN
Start: 2019-11-23 — End: 2019-11-23
  Administered 2019-11-23: 400 [IU]

## 2019-11-23 MED ORDER — FENTANYL CITRATE (PF) 100 MCG/2ML IJ SOLN
INTRAMUSCULAR | Status: DC | PRN
  Administered 2019-11-23 (×2): 50 ug via INTRAVENOUS

## 2019-11-23 MED ORDER — CHLORHEXIDINE GLUCONATE 0.12 % MT SOLN
OROMUCOSAL | Status: AC
  Administered 2019-11-23: 15 mL via OROMUCOSAL
  Filled 2019-11-23: qty 135

## 2019-11-23 MED ORDER — EPINEPHRINE PF 1 MG/ML IJ SOLN
INTRAMUSCULAR | Status: AC
  Filled 2019-11-23: qty 1

## 2019-11-23 MED ORDER — ACETAMINOPHEN 500 MG PO TABS: 1000.0000 mg | ORAL_TABLET | ORAL | Status: AC

## 2019-11-23 MED ORDER — ONDANSETRON HCL 4 MG/2ML IJ SOLN
INTRAMUSCULAR | Status: DC | PRN
  Administered 2019-11-23: 4 mg via INTRAVENOUS

## 2019-11-23 MED ORDER — MIDAZOLAM HCL 2 MG/2ML IJ SOLN
INTRAMUSCULAR | Status: DC | PRN
  Administered 2019-11-23: 2 mg via INTRAVENOUS

## 2019-11-23 MED ORDER — CEFAZOLIN SODIUM-DEXTROSE 2-4 GM/100ML-% IV SOLN
INTRAVENOUS | Status: AC
  Filled 2019-11-23: qty 100

## 2019-11-23 MED ORDER — CHLORHEXIDINE GLUCONATE CLOTH 2 % EX PADS: 6.0000 | MEDICATED_PAD | Freq: Once | CUTANEOUS | Status: DC

## 2019-11-23 MED ORDER — FAMOTIDINE 20 MG PO TABS: 20.0000 mg | ORAL_TABLET | Freq: Once | ORAL | Status: AC

## 2019-11-23 MED ORDER — PROPOFOL 10 MG/ML IV BOLUS
INTRAVENOUS | Status: AC
  Filled 2019-11-23: qty 20

## 2019-11-23 MED ORDER — FENTANYL CITRATE (PF) 100 MCG/2ML IJ SOLN
INTRAMUSCULAR | Status: AC
  Filled 2019-11-23: qty 2

## 2019-11-23 MED ORDER — LACTATED RINGERS IV SOLN: INTRAVENOUS | Status: DC

## 2019-11-23 MED ORDER — ACETAMINOPHEN 500 MG PO TABS
ORAL_TABLET | ORAL | Status: AC
  Administered 2019-11-23: 1000 mg via ORAL
  Filled 2019-11-23: qty 2

## 2019-11-23 MED ORDER — BUPIVACAINE HCL (PF) 0.25 % IJ SOLN
INTRAMUSCULAR | Status: AC
  Filled 2019-11-23: qty 30

## 2019-11-23 MED ORDER — CHLORHEXIDINE GLUCONATE 0.12 % MT SOLN: 15.0000 mL | Freq: Once | OROMUCOSAL | Status: AC

## 2019-11-23 MED ORDER — HEPARIN SOD (PORK) LOCK FLUSH 100 UNIT/ML IV SOLN
INTRAVENOUS | Status: AC
  Filled 2019-11-23: qty 5

## 2019-11-23 MED ORDER — SODIUM CHLORIDE 0.9 % IV SOLN
INTRAVENOUS | Status: DC | PRN
  Administered 2019-11-23: 5 mL via INTRAMUSCULAR

## 2019-11-23 MED ORDER — BUPIVACAINE-EPINEPHRINE (PF) 0.25% -1:200000 IJ SOLN
INTRAMUSCULAR | Status: DC | PRN
  Administered 2019-11-23: 10 mL

## 2019-11-23 MED ORDER — MIDAZOLAM HCL 2 MG/2ML IJ SOLN
INTRAMUSCULAR | Status: AC
  Filled 2019-11-23: qty 2

## 2019-11-23 MED ORDER — PHENYLEPHRINE HCL (PRESSORS) 10 MG/ML IV SOLN
INTRAVENOUS | Status: DC | PRN
  Administered 2019-11-23 (×2): 100 ug via INTRAVENOUS

## 2019-11-23 MED ORDER — ONDANSETRON HCL 4 MG/2ML IJ SOLN: 4.0000 mg | Freq: Once | INTRAMUSCULAR | Status: DC | PRN

## 2019-11-23 MED ORDER — HEPARIN SODIUM (PORCINE) 5000 UNIT/ML IJ SOLN
INTRAMUSCULAR | Status: AC
  Filled 2019-11-23: qty 1

## 2019-11-23 SURGICAL SUPPLY — 30 items
ADH SKN CLS APL DERMABOND .7 (GAUZE/BANDAGES/DRESSINGS) ×1
BAG DECANTER FOR FLEXI CONT (MISCELLANEOUS) ×6 IMPLANT
CANISTER SUCT 1200ML W/VALVE (MISCELLANEOUS) ×3 IMPLANT
CHLORAPREP W/TINT 26 (MISCELLANEOUS) ×3 IMPLANT
COVER LIGHT HANDLE STERIS (MISCELLANEOUS) ×6 IMPLANT
COVER WAND RF STERILE (DRAPES) ×3 IMPLANT
DERMABOND ADVANCED (GAUZE/BANDAGES/DRESSINGS) ×2
DERMABOND ADVANCED .7 DNX12 (GAUZE/BANDAGES/DRESSINGS) ×1 IMPLANT
DRAPE C-ARM XRAY 36X54 (DRAPES) ×3 IMPLANT
ELECT CAUTERY BLADE 6.4 (BLADE) ×3 IMPLANT
ELECT REM PT RETURN 9FT ADLT (ELECTROSURGICAL) ×3
ELECTRODE REM PT RTRN 9FT ADLT (ELECTROSURGICAL) ×1 IMPLANT
GLOVE ORTHO TXT STRL SZ7.5 (GLOVE) ×6 IMPLANT
GOWN STRL REUS W/ TWL LRG LVL3 (GOWN DISPOSABLE) ×2 IMPLANT
GOWN STRL REUS W/TWL LRG LVL3 (GOWN DISPOSABLE) ×6
IV NS 500ML (IV SOLUTION) ×3
IV NS 500ML BAXH (IV SOLUTION) ×1 IMPLANT
KIT PORT POWER 8FR ISP CVUE (Port) ×3 IMPLANT
KIT TURNOVER KIT A (KITS) ×3 IMPLANT
NDL FILTER BLUNT 18X1 1/2 (NEEDLE) ×1 IMPLANT
NEEDLE FILTER BLUNT 18X 1/2SAF (NEEDLE) ×2
NEEDLE FILTER BLUNT 18X1 1/2 (NEEDLE) ×1 IMPLANT
NEEDLE HYPO 22GX1.5 SAFETY (NEEDLE) ×3 IMPLANT
PACK PORT-A-CATH (MISCELLANEOUS) ×3 IMPLANT
SUT MNCRL AB 4-0 PS2 18 (SUTURE) ×3 IMPLANT
SUT VIC AB 3-0 SH 27 (SUTURE) ×3
SUT VIC AB 3-0 SH 27X BRD (SUTURE) ×1 IMPLANT
SYR 10ML LL (SYRINGE) ×3 IMPLANT
SYR 3ML LL SCALE MARK (SYRINGE) ×3 IMPLANT
SYR 5ML LL (SYRINGE) ×3 IMPLANT

## 2019-11-23 NOTE — Patient Instructions (Signed)
Oxaliplatin Injection What is this medicine? OXALIPLATIN (ox AL i PLA tin) is a chemotherapy drug. It targets fast dividing cells, like cancer cells, and causes these cells to die. This medicine is used to treat cancers of the colon and rectum, and many other cancers. This medicine may be used for other purposes; ask your health care provider or pharmacist if you have questions. COMMON BRAND NAME(S): Eloxatin What should I tell my health care provider before I take this medicine? They need to know if you have any of these conditions:  heart disease  history of irregular heartbeat  liver disease  low blood counts, like white cells, platelets, or red blood cells  lung or breathing disease, like asthma  take medicines that treat or prevent blood clots  tingling of the fingers or toes, or other nerve disorder  an unusual or allergic reaction to oxaliplatin, other chemotherapy, other medicines, foods, dyes, or preservatives  pregnant or trying to get pregnant  breast-feeding How should I use this medicine? This drug is given as an infusion into a vein. It is administered in a hospital or clinic by a specially trained health care professional. Talk to your pediatrician regarding the use of this medicine in children. Special care may be needed. Overdosage: If you think you have taken too much of this medicine contact a poison control center or emergency room at once. NOTE: This medicine is only for you. Do not share this medicine with others. What if I miss a dose? It is important not to miss a dose. Call your doctor or health care professional if you are unable to keep an appointment. What may interact with this medicine? Do not take this medicine with any of the following medications:  cisapride  dronedarone  pimozide  thioridazine This medicine may also interact with the following medications:  aspirin and aspirin-like medicines  certain medicines that treat or prevent  blood clots like warfarin, apixaban, dabigatran, and rivaroxaban  cisplatin  cyclosporine  diuretics  medicines for infection like acyclovir, adefovir, amphotericin B, bacitracin, cidofovir, foscarnet, ganciclovir, gentamicin, pentamidine, vancomycin  NSAIDs, medicines for pain and inflammation, like ibuprofen or naproxen  other medicines that prolong the QT interval (an abnormal heart rhythm)  pamidronate  zoledronic acid This list may not describe all possible interactions. Give your health care provider a list of all the medicines, herbs, non-prescription drugs, or dietary supplements you use. Also tell them if you smoke, drink alcohol, or use illegal drugs. Some items may interact with your medicine. What should I watch for while using this medicine? Your condition will be monitored carefully while you are receiving this medicine. You may need blood work done while you are taking this medicine. This medicine may make you feel generally unwell. This is not uncommon as chemotherapy can affect healthy cells as well as cancer cells. Report any side effects. Continue your course of treatment even though you feel ill unless your healthcare professional tells you to stop. This medicine can make you more sensitive to cold. Do not drink cold drinks or use ice. Cover exposed skin before coming in contact with cold temperatures or cold objects. When out in cold weather wear warm clothing and cover your mouth and nose to warm the air that goes into your lungs. Tell your doctor if you get sensitive to the cold. Do not become pregnant while taking this medicine or for 9 months after stopping it. Women should inform their health care professional if they wish to become   pregnant or think they might be pregnant. Men should not father a child while taking this medicine and for 6 months after stopping it. There is potential for serious side effects to an unborn child. Talk to your health care professional  for more information. Do not breast-feed a child while taking this medicine or for 3 months after stopping it. This medicine has caused ovarian failure in some women. This medicine may make it more difficult to get pregnant. Talk to your health care professional if you are concerned about your fertility. This medicine has caused decreased sperm counts in some men. This may make it more difficult to father a child. Talk to your health care professional if you are concerned about your fertility. This medicine may increase your risk of getting an infection. Call your health care professional for advice if you get a fever, chills, or sore throat, or other symptoms of a cold or flu. Do not treat yourself. Try to avoid being around people who are sick. Avoid taking medicines that contain aspirin, acetaminophen, ibuprofen, naproxen, or ketoprofen unless instructed by your health care professional. These medicines may hide a fever. Be careful brushing or flossing your teeth or using a toothpick because you may get an infection or bleed more easily. If you have any dental work done, tell your dentist you are receiving this medicine. What side effects may I notice from receiving this medicine? Side effects that you should report to your doctor or health care professional as soon as possible:  allergic reactions like skin rash, itching or hives, swelling of the face, lips, or tongue  breathing problems  cough  low blood counts - this medicine may decrease the number of white blood cells, red blood cells, and platelets. You may be at increased risk for infections and bleeding  nausea, vomiting  pain, redness, or irritation at site where injected  pain, tingling, numbness in the hands or feet  signs and symptoms of bleeding such as bloody or black, tarry stools; red or dark brown urine; spitting up blood or brown material that looks like coffee grounds; red spots on the skin; unusual bruising or bleeding  from the eyes, gums, or nose  signs and symptoms of a dangerous change in heartbeat or heart rhythm like chest pain; dizziness; fast, irregular heartbeat; palpitations; feeling faint or lightheaded; falls  signs and symptoms of infection like fever; chills; cough; sore throat; pain or trouble passing urine  signs and symptoms of liver injury like dark yellow or brown urine; general ill feeling or flu-like symptoms; light-colored stools; loss of appetite; nausea; right upper belly pain; unusually weak or tired; yellowing of the eyes or skin  signs and symptoms of low red blood cells or anemia such as unusually weak or tired; feeling faint or lightheaded; falls  signs and symptoms of muscle injury like dark urine; trouble passing urine or change in the amount of urine; unusually weak or tired; muscle pain; back pain Side effects that usually do not require medical attention (report to your doctor or health care professional if they continue or are bothersome):  changes in taste  diarrhea  gas  hair loss  loss of appetite  mouth sores This list may not describe all possible side effects. Call your doctor for medical advice about side effects. You may report side effects to FDA at 1-800-FDA-1088. Where should I keep my medicine? This drug is given in a hospital or clinic and will not be stored at home. NOTE:   This sheet is a summary. It may not cover all possible information. If you have questions about this medicine, talk to your doctor, pharmacist, or health care provider.  2020 Elsevier/Gold Standard (2018-10-22 12:20:35) Fluorouracil, 5-FU injection What is this medicine? FLUOROURACIL, 5-FU (flure oh YOOR a sil) is a chemotherapy drug. It slows the growth of cancer cells. This medicine is used to treat many types of cancer like breast cancer, colon or rectal cancer, pancreatic cancer, and stomach cancer. This medicine may be used for other purposes; ask your health care provider or  pharmacist if you have questions. COMMON BRAND NAME(S): Adrucil What should I tell my health care provider before I take this medicine? They need to know if you have any of these conditions:  blood disorders  dihydropyrimidine dehydrogenase (DPD) deficiency  infection (especially a virus infection such as chickenpox, cold sores, or herpes)  kidney disease  liver disease  malnourished, poor nutrition  recent or ongoing radiation therapy  an unusual or allergic reaction to fluorouracil, other chemotherapy, other medicines, foods, dyes, or preservatives  pregnant or trying to get pregnant  breast-feeding How should I use this medicine? This drug is given as an infusion or injection into a vein. It is administered in a hospital or clinic by a specially trained health care professional. Talk to your pediatrician regarding the use of this medicine in children. Special care may be needed. Overdosage: If you think you have taken too much of this medicine contact a poison control center or emergency room at once. NOTE: This medicine is only for you. Do not share this medicine with others. What if I miss a dose? It is important not to miss your dose. Call your doctor or health care professional if you are unable to keep an appointment. What may interact with this medicine?  allopurinol  cimetidine  dapsone  digoxin  hydroxyurea  leucovorin  levamisole  medicines for seizures like ethotoin, fosphenytoin, phenytoin  medicines to increase blood counts like filgrastim, pegfilgrastim, sargramostim  medicines that treat or prevent blood clots like warfarin, enoxaparin, and dalteparin  methotrexate  metronidazole  pyrimethamine  some other chemotherapy drugs like busulfan, cisplatin, estramustine, vinblastine  trimethoprim  trimetrexate  vaccines Talk to your doctor or health care professional before taking any of these  medicines:  acetaminophen  aspirin  ibuprofen  ketoprofen  naproxen This list may not describe all possible interactions. Give your health care provider a list of all the medicines, herbs, non-prescription drugs, or dietary supplements you use. Also tell them if you smoke, drink alcohol, or use illegal drugs. Some items may interact with your medicine. What should I watch for while using this medicine? Visit your doctor for checks on your progress. This drug may make you feel generally unwell. This is not uncommon, as chemotherapy can affect healthy cells as well as cancer cells. Report any side effects. Continue your course of treatment even though you feel ill unless your doctor tells you to stop. In some cases, you may be given additional medicines to help with side effects. Follow all directions for their use. Call your doctor or health care professional for advice if you get a fever, chills or sore throat, or other symptoms of a cold or flu. Do not treat yourself. This drug decreases your body's ability to fight infections. Try to avoid being around people who are sick. This medicine may increase your risk to bruise or bleed. Call your doctor or health care professional if you notice any   unusual bleeding. Be careful brushing and flossing your teeth or using a toothpick because you may get an infection or bleed more easily. If you have any dental work done, tell your dentist you are receiving this medicine. Avoid taking products that contain aspirin, acetaminophen, ibuprofen, naproxen, or ketoprofen unless instructed by your doctor. These medicines may hide a fever. Do not become pregnant while taking this medicine. Women should inform their doctor if they wish to become pregnant or think they might be pregnant. There is a potential for serious side effects to an unborn child. Talk to your health care professional or pharmacist for more information. Do not breast-feed an infant while taking  this medicine. Men should inform their doctor if they wish to father a child. This medicine may lower sperm counts. Do not treat diarrhea with over the counter products. Contact your doctor if you have diarrhea that lasts more than 2 days or if it is severe and watery. This medicine can make you more sensitive to the sun. Keep out of the sun. If you cannot avoid being in the sun, wear protective clothing and use sunscreen. Do not use sun lamps or tanning beds/booths. What side effects may I notice from receiving this medicine? Side effects that you should report to your doctor or health care professional as soon as possible:  allergic reactions like skin rash, itching or hives, swelling of the face, lips, or tongue  low blood counts - this medicine may decrease the number of white blood cells, red blood cells and platelets. You may be at increased risk for infections and bleeding.  signs of infection - fever or chills, cough, sore throat, pain or difficulty passing urine  signs of decreased platelets or bleeding - bruising, pinpoint red spots on the skin, black, tarry stools, blood in the urine  signs of decreased red blood cells - unusually weak or tired, fainting spells, lightheadedness  breathing problems  changes in vision  chest pain  mouth sores  nausea and vomiting  pain, swelling, redness at site where injected  pain, tingling, numbness in the hands or feet  redness, swelling, or sores on hands or feet  stomach pain  unusual bleeding Side effects that usually do not require medical attention (report to your doctor or health care professional if they continue or are bothersome):  changes in finger or toe nails  diarrhea  dry or itchy skin  hair loss  headache  loss of appetite  sensitivity of eyes to the light  stomach upset  unusually teary eyes This list may not describe all possible side effects. Call your doctor for medical advice about side effects.  You may report side effects to FDA at 1-800-FDA-1088. Where should I keep my medicine? This drug is given in a hospital or clinic and will not be stored at home. NOTE: This sheet is a summary. It may not cover all possible information. If you have questions about this medicine, talk to your doctor, pharmacist, or health care provider.  2020 Elsevier/Gold Standard (2007-10-08 13:53:16) Leucovorin injection What is this medicine? LEUCOVORIN (loo koe VOR in) is used to prevent or treat the harmful effects of some medicines. This medicine is used to treat anemia caused by a low amount of folic acid in the body. It is also used with 5-fluorouracil (5-FU) to treat colon cancer. This medicine may be used for other purposes; ask your health care provider or pharmacist if you have questions. What should I tell my health care   provider before I take this medicine? They need to know if you have any of these conditions:  anemia from low levels of vitamin B-12 in the blood  an unusual or allergic reaction to leucovorin, folic acid, other medicines, foods, dyes, or preservatives  pregnant or trying to get pregnant  breast-feeding How should I use this medicine? This medicine is for injection into a muscle or into a vein. It is given by a health care professional in a hospital or clinic setting. Talk to your pediatrician regarding the use of this medicine in children. Special care may be needed. Overdosage: If you think you have taken too much of this medicine contact a poison control center or emergency room at once. NOTE: This medicine is only for you. Do not share this medicine with others. What if I miss a dose? This does not apply. What may interact with this medicine?  capecitabine  fluorouracil  phenobarbital  phenytoin  primidone  trimethoprim-sulfamethoxazole This list may not describe all possible interactions. Give your health care provider a list of all the medicines, herbs,  non-prescription drugs, or dietary supplements you use. Also tell them if you smoke, drink alcohol, or use illegal drugs. Some items may interact with your medicine. What should I watch for while using this medicine? Your condition will be monitored carefully while you are receiving this medicine. This medicine may increase the side effects of 5-fluorouracil, 5-FU. Tell your doctor or health care professional if you have diarrhea or mouth sores that do not get better or that get worse. What side effects may I notice from receiving this medicine? Side effects that you should report to your doctor or health care professional as soon as possible:  allergic reactions like skin rash, itching or hives, swelling of the face, lips, or tongue  breathing problems  fever, infection  mouth sores  unusual bleeding or bruising  unusually weak or tired Side effects that usually do not require medical attention (report to your doctor or health care professional if they continue or are bothersome):  constipation or diarrhea  loss of appetite  nausea, vomiting This list may not describe all possible side effects. Call your doctor for medical advice about side effects. You may report side effects to FDA at 1-800-FDA-1088. Where should I keep my medicine? This drug is given in a hospital or clinic and will not be stored at home. NOTE: This sheet is a summary. It may not cover all possible information. If you have questions about this medicine, talk to your doctor, pharmacist, or health care provider.  2020 Elsevier/Gold Standard (2007-12-09 16:50:29)  

## 2019-11-23 NOTE — Transfer of Care (Signed)
Immediate Anesthesia Transfer of Care Note  Patient: Maria Davies  Procedure(s) Performed: INSERTION PORT-A-CATH (Right )  Patient Location: PACU  Anesthesia Type:General  Level of Consciousness: drowsy  Airway & Oxygen Therapy: Patient Spontanous Breathing and Patient connected to face mask oxygen  Post-op Assessment: Report given to RN  Post vital signs: stable  Last Vitals:  Vitals Value Taken Time  BP 92/47 11/23/19 0814  Temp    Pulse 93 11/23/19 0814  Resp 21 11/23/19 0814  SpO2 99 % 11/23/19 0814  Vitals shown include unvalidated device data.  Last Pain:  Vitals:   11/23/19 0657  TempSrc: Tympanic  PainSc: 0-No pain         Complications: No apparent anesthesia complications

## 2019-11-23 NOTE — OR Nursing (Signed)
Per Dr. Christian Mate, pt may resume aspirin today.  Added to d/c instructions; med section.

## 2019-11-23 NOTE — Anesthesia Preprocedure Evaluation (Signed)
Anesthesia Evaluation  Patient identified by MRN, date of birth, ID band Patient awake    Reviewed: Allergy & Precautions, NPO status , Patient's Chart, lab work & pertinent test results  History of Anesthesia Complications Negative for: history of anesthetic complications  Airway Mallampati: II  TM Distance: >3 FB Neck ROM: Full    Dental no notable dental hx.    Pulmonary neg pulmonary ROS, neg sleep apnea, neg COPD,    breath sounds clear to auscultation- rhonchi (-) wheezing      Cardiovascular Exercise Tolerance: Good hypertension, Pt. on medications (-) CAD, (-) Past MI, (-) Cardiac Stents and (-) CABG + Valvular Problems/Murmurs (moderate AS) AS  Rhythm:Regular Rate:Normal - Systolic murmurs and - Diastolic murmurs Echo 06/26/73: NORMAL LEFT VENTRICULAR SYSTOLIC FUNCTION NORMAL RIGHT VENTRICULAR SYSTOLIC FUNCTION MILD VALVULAR REGURGITATION (See above) MODERATE VALVULAR STENOSIS (See above) Morphology: MODERATELY THICKENED Mobility: PARTIALLY MOBILE Closest EF: >55% (Estimated) AVS: MODERATE AS Aortic: TRIVIAL AR Mitral: MILD MR Tricuspid: TRIVIAL TR   Neuro/Psych neg Seizures negative neurological ROS  negative psych ROS   GI/Hepatic negative GI ROS, Neg liver ROS,   Endo/Other  neg diabetesHypothyroidism   Renal/GU negative Renal ROS     Musculoskeletal  (+) Arthritis ,   Abdominal (+) + obese,   Peds  Hematology  (+) anemia ,   Anesthesia Other Findings Past Medical History: No date: Anemia No date: Arthritis No date: Basal cell carcinoma     Comment:  back 2010: Breast cancer ([place])     Comment:  RT LUMPECTOMY No date: Cancer ([place])     Comment:  rt breast No date: Chemotherapy induced nausea and vomiting No date: Heart murmur No date: Hypertension No date: Hypothyroidism     Comment:  currently not on meds No date: Osteopenia 2010: Personal history of chemotherapy     Comment:  right  breast ca 2010: Personal history of radiation therapy     Comment:  right breast ca No date: Port-A-Cath in place   Reproductive/Obstetrics                             Anesthesia Physical Anesthesia Plan  ASA: III  Anesthesia Plan: General   Post-op Pain Management:    Induction: Intravenous  PONV Risk Score and Plan: 2 and Propofol infusion  Airway Management Planned: Natural Airway  Additional Equipment:   Intra-op Plan:   Post-operative Plan:   Informed Consent: I have reviewed the patients History and Physical, chart, labs and discussed the procedure including the risks, benefits and alternatives for the proposed anesthesia with the patient or authorized representative who has indicated his/her understanding and acceptance.     Dental advisory given  Plan Discussed with: CRNA and Anesthesiologist  Anesthesia Plan Comments:         Anesthesia Quick Evaluation

## 2019-11-23 NOTE — Discharge Instructions (Addendum)

## 2019-11-23 NOTE — Progress Notes (Signed)
Pharmacist Chemotherapy Monitoring - Initial Assessment    Anticipated start date: 11/30/19   Regimen:  . Are orders appropriate based on the patient's diagnosis, regimen, and cycle? Yes . Does the plan date match the patient's scheduled date? Yes . Is the sequencing of drugs appropriate? Yes . Are the premedications appropriate for the patient's regimen? Yes . Prior Authorization for treatment is: Approved o If applicable, is the correct biosimilar selected based on the patient's insurance? not applicable  Organ Function and Labs: Marland Kitchen Are dose adjustments needed based on the patient's renal function, hepatic function, or hematologic function? No . Are appropriate labs ordered prior to the start of patient's treatment? Yes . Other organ system assessment, if indicated: N/A . The following baseline labs, if indicated, have been ordered: N/A  Dose Assessment: . Are the drug doses appropriate? Yes . Are the following correct: o Drug concentrations Yes o IV fluid compatible with drug Yes o Administration routes Yes o Timing of therapy Yes . If applicable, does the patient have documented access for treatment and/or plans for port-a-cath placement? not applicable . If applicable, have lifetime cumulative doses been properly documented and assessed? not applicable Lifetime Dose Tracking  No doses have been documented on this patient for the following tracked chemicals: Doxorubicin, Epirubicin, Idarubicin, Daunorubicin, Mitoxantrone, Bleomycin, Oxaliplatin, Carboplatin, Liposomal Doxorubicin  o   Toxicity Monitoring/Prevention: . The patient has the following take home antiemetics prescribed: Ondansetron and Prochlorperazine . The patient has the following take home medications prescribed: N/A . Medication allergies and previous infusion related reactions, if applicable, have been reviewed and addressed. Yes . The patient's current medication list has been assessed for drug-drug interactions  with their chemotherapy regimen. no significant drug-drug interactions were identified on review.  Order Review: . Are the treatment plan orders signed? Yes . Is the patient scheduled to see a provider prior to their treatment? Yes  I verify that I have reviewed each item in the above checklist and answered each question accordingly.  Nickson Middlesworth K 11/23/2019 8:40 AM

## 2019-11-23 NOTE — Op Note (Signed)
SURGICAL PROCEDURE REPORT  DATE OF PROCEDURE: 11/23/2019   ATTENDING SURGEON:  Ronny Bacon, M.D., FACS   ANESTHESIA: Local with MAC.  PRE-OPERATIVE DIAGNOSIS: Advanced Colon Cancer requiring durable central venous access for chemotherapy   POST-OPERATIVE DIAGNOSIS: Same.  PROCEDURE: (cpt: [id number])  Insertion of right subclavian vein Bard PowerPort central venous catheter with subcutaneous port under fluoroscopic guidance   INTRAOPERATIVE FINDINGS: Fluoroscopic confirmation of guidewire in superior vena cava, tip at fluoroscopy appears to be at caval atrial junction, excellent return and easy flush, well-secured central venous catheter with subcutaneous port at completion of the procedure   FLUOROSCOPY: as recorded.   ESTIMATED BLOOD LOSS: Minimal (<20 mL)   SPECIMENS: None   IMPLANTS: 2F Bard PowerPort central venous catheter with subcutaneous port  DRAINS: None   COMPLICATIONS: None apparent   CONDITION AT COMPLETION: Hemodynamically stable, awake   DISPOSITION: PACU   INDICATION(S) FOR PROCEDURE:  Patient is a 79 y.o. female who presented with advanced colon cancer requiring durable central venous access for chemotherapy. All risks, benefits, and alternatives to above elective procedures were discussed with the patient, who elected to proceed, and informed consent was accordingly obtained at that time.  DETAILS OF PROCEDURE:  Patient was brought to the operative suite and appropriately identified. Right subclavian access site and planned port placement site were prepped and draped in the usual sterile fashion. Following a brief timeout, in Trendelenburg position, percutaneous Right subclavian venous access was obtained using Seldinger technique, by which local anesthetic was injected over the right subclavian region, and access needle was inserted into the SCV vein, through which soft guidewire was advanced, over which access needle was withdrawn. Length of catheter needed to  position the catheter tip at the atrio-caval junction was then measured under direct fluoroscopic visualization, after which the catheter was cut to the measured length. Guidewire was secured, attention was directed to injection of local anesthetic along the planned port site, 2-3 cm transverse ipsilateral chest incision was made and confirmed to accommodate the subcutaneous port, and flushed measured catheter was attached to port, then the port was placed within the subcutaneous pocket. Insertion sheath was advanced over the guidewire, which was withdrawn along with the insertion sheath dilator.  The catheter was then advanced through the sheath into the SCV and SVC.  Port was confirmed to withdraw blood and flush easily, after which concentrated heparin was instilled into the port and catheter. Dermis at the subcutaneous pocket was re-approximated using buried interrupted 3-0 Vicryl suture, and 4-0 Monocryl suture was used to re-approximate skin at the insertion/subcutaneous port site in running subcuticular fashion for the subcutaneous port and buried interrupted fashion for the insertion site. Skin was cleaned, dried, and sterile skin glue was applied. Patient was then safely transferred to PACU for a chest x-ray.  I was present for all aspects of the procedures, and no intraprocedural complications were apparent.   Ronny Bacon, M.D., [facility] 11/23/2019 8:31 AM

## 2019-11-23 NOTE — Anesthesia Postprocedure Evaluation (Signed)
Anesthesia Post Note  Patient: Maria Davies  Procedure(s) Performed: INSERTION PORT-A-CATH (Right )  Patient location during evaluation: PACU Anesthesia Type: General Level of consciousness: awake and alert and oriented Pain management: pain level controlled Vital Signs Assessment: post-procedure vital signs reviewed and stable Respiratory status: spontaneous breathing, nonlabored ventilation and respiratory function stable Cardiovascular status: blood pressure returned to baseline and stable Postop Assessment: no signs of nausea or vomiting Anesthetic complications: no     Last Vitals:  Vitals:   11/23/19 0900 11/23/19 0911  BP: (!) 107/52 (!) 117/51  Pulse: 85 81  Resp: 16 16  Temp: 36.6 C   SpO2: 100% 100%    Last Pain:  Vitals:   11/23/19 0911  TempSrc:   PainSc: 0-No pain                 Inmer Nix

## 2019-11-23 NOTE — Interval H&P Note (Signed)
History and Physical Interval Note:  11/23/2019 7:15 AM  Maria Davies  has presented today for surgery, with the diagnosis of Colon cancer.  The various methods of treatment have been discussed with the patient and family. After consideration of risks, benefits and other options for treatment, the patient has consented to  Procedure(s): INSERTION PORT-A-CATH (Right) as a surgical intervention.  The patient's history has been reviewed, patient examined, no change in status, stable for surgery.  I have reviewed the patient's chart and labs.  Questions were answered to the patient's satisfaction.   The right side is marked.   Ronny Bacon, M.D., [facility]  11/23/2019 ; 7:15 AM

## 2019-11-24 ENCOUNTER — Inpatient Hospital Stay: Payer: Medicare Other

## 2019-11-24 ENCOUNTER — Inpatient Hospital Stay (HOSPITAL_BASED_OUTPATIENT_CLINIC_OR_DEPARTMENT_OTHER): Payer: Medicare Other | Admitting: Oncology

## 2019-11-24 DIAGNOSIS — C189 Malignant neoplasm of colon, unspecified: Secondary | ICD-10-CM

## 2019-11-24 DIAGNOSIS — Z5111 Encounter for antineoplastic chemotherapy: Secondary | ICD-10-CM | POA: Diagnosis not present

## 2019-11-24 NOTE — Progress Notes (Signed)
[place]  Telephone:([phone] [phone]  Patient Care Team: Valerie Roys, DO as PCP - General (Family Medicine) Guadalupe Maple, MD as PCP - Family Medicine (Family Medicine) Ronny Bacon, MD as Consulting Physician (General Surgery)   Name of the patient: Maria Davies  [id number]  [birth year]   Date of visit: 11/24/19  Diagnosis-colon cancer  Chief complaint/Reason for visit- Initial Meeting for [facility], preparing for starting chemotherapy  Heme/Onc history:  Oncology History  Colon cancer ([place])  10/28/2019 Initial Diagnosis   Colon cancer ([place])   11/13/2019 Cancer Staging   Staging form: Colon and Rectum, AJCC 8th Edition - Clinical stage from 11/13/2019: Stage IIIB (cT3, cN1a, cM0) - Signed by Lloyd Huger, MD on 11/13/2019   11/30/2019 -  Chemotherapy   The patient had palonosetron (ALOXI) injection 0.25 mg, 0.25 mg, Intravenous,  Once, 0 of 12 cycles leucovorin 744 mg in dextrose 5 % 250 mL infusion, 400 mg/m2 = 744 mg, Intravenous,  Once, 0 of 12 cycles oxaliplatin (ELOXATIN) 160 mg in dextrose 5 % 500 mL chemo infusion, 85 mg/m2 = 160 mg, Intravenous,  Once, 0 of 12 cycles fluorouracil (ADRUCIL) chemo injection 750 mg, 400 mg/m2 = 750 mg, Intravenous,  Once, 0 of 12 cycles fluorouracil (ADRUCIL) 4,450 mg in sodium chloride 0.9 % 61 mL chemo infusion, 2,400 mg/m2 = 4,450 mg, Intravenous, 1 Day/Dose, 0 of 12 cycles  for chemotherapy treatment.      Interval history-Maria Davies is a 79 year old female who presents to chemo care clinic today for initial meeting in preparation for starting chemotherapy. I introduced the chemo care clinic and we discussed that the role of the clinic is to assist those who are at an increased risk of emergency room visits and/or complications during the course of chemotherapy treatment. We discussed that the increased risk takes into account factors  such as age, performance status, and co-morbidities. We also discussed that for some, this might include barriers to care such as not having a primary care provider, lack of insurance/transportation, or not being able to afford medications. We discussed that the goal of the program is to help prevent unplanned ER visits and help reduce complications during chemotherapy. We do this by discussing specific risk factors to each individual and identifying ways that we can help improve these risk factors and reduce barriers to care.   ECOG FS:0 - Asymptomatic  Review of systems- Review of Systems  Constitutional: Negative.  Negative for chills, fever, malaise/fatigue and weight loss.  HENT: Negative for congestion, ear pain and tinnitus.   Eyes: Negative.  Negative for blurred vision and double vision.  Respiratory: Negative.  Negative for cough, sputum production and shortness of breath.   Cardiovascular: Negative.  Negative for chest pain, palpitations and leg swelling.  Gastrointestinal: Positive for abdominal pain (discomfort). Negative for constipation, diarrhea, nausea and vomiting.  Genitourinary: Negative for dysuria, frequency and urgency.  Musculoskeletal: Negative for back pain and falls.  Skin: Negative.  Negative for rash.  Neurological: Negative.  Negative for weakness and headaches.  Endo/Heme/Allergies: Negative.  Does not bruise/bleed easily.  Psychiatric/Behavioral: Negative.  Negative for depression. The patient is not nervous/anxious and does not have insomnia.      Current treatment- Folfox  No Known Allergies  Past Medical History:  Diagnosis Date  . Anemia   . Arthritis   . Basal cell carcinoma    back  . Breast cancer ([place]) 2010  RT LUMPECTOMY  . Cancer ([place])    rt breast  . Chemotherapy induced nausea and vomiting   . Heart murmur   . Hypertension   . Hypothyroidism    currently not on meds  . Osteopenia   . Personal history of chemotherapy 2010   right  breast ca  . Personal history of radiation therapy 2010   right breast ca  . Port-A-Cath in place     Past Surgical History:  Procedure Laterality Date  . BACK SURGERY    . BREAST EXCISIONAL BIOPSY Right 03/2009   radiation and chemo +  . BREAST EXCISIONAL BIOPSY Bilateral    NEG  . BREAST SURGERY    . CATARACT EXTRACTION W/ INTRAOCULAR LENS  IMPLANT, BILATERAL    . CHOLECYSTECTOMY    . COLONOSCOPY WITH PROPOFOL N/A 10/19/2019   Procedure: COLONOSCOPY WITH PROPOFOL-attempted, unable to perform poor prep;  Surgeon: Lucilla Lame, MD;  Location: [place];  Service: Endoscopy;  Laterality: N/A;  priority 3  . COLONOSCOPY WITH PROPOFOL N/A 10/20/2019   Procedure: COLONOSCOPY WITH PROPOFOL;  Surgeon: Lucilla Lame, MD;  Location: [facility] ENDOSCOPY;  Service: Endoscopy;  Laterality: N/A;  . ESOPHAGEAL DILATION  10/19/2019   Procedure: ESOPHAGEAL DILATION;  Surgeon: Lucilla Lame, MD;  Location: [place];  Service: Endoscopy;;  . ESOPHAGOGASTRODUODENOSCOPY (EGD) WITH PROPOFOL N/A 10/19/2019   Procedure: ESOPHAGOGASTRODUODENOSCOPY (EGD) WITH PROPOFOL;  Surgeon: Lucilla Lame, MD;  Location: [place];  Service: Endoscopy;  Laterality: N/A;  . PORTACATH PLACEMENT Right 11/23/2019   Procedure: INSERTION PORT-A-CATH;  Surgeon: Ronny Bacon, MD;  Location: [facility] ORS;  Service: General;  Laterality: Right;    Social History   Socioeconomic History  . Marital status: Widowed    Spouse name: Not on file  . Number of children: Not on file  . Years of education: Not on file  . Highest education level: Not on file  Occupational History  . Occupation: retired   Tobacco Use  . Smoking status: Never Smoker  . Smokeless tobacco: Never Used  Substance and Sexual Activity  . Alcohol use: Not Currently    Alcohol/week: 0.0 standard drinks    Comment: No alcohol since 08/05/19  . Drug use: No  . Sexual activity: Not on file  Other Topics Concern  . Not on file  Social History  Narrative  . Not on file   Social Determinants of Health   Financial Resource Strain:   . Difficulty of Paying Living Expenses:   Food Insecurity:   . Worried About Charity fundraiser in the Last Year:   . Arboriculturist in the Last Year:   Transportation Needs:   . Film/video editor (Medical):   Marland Kitchen Lack of Transportation (Non-Medical):   Physical Activity:   . Days of Exercise per Week:   . Minutes of Exercise per Session:   Stress:   . Feeling of Stress :   Social Connections:   . Frequency of Communication with Friends and Family:   . Frequency of Social Gatherings with Friends and Family:   . Attends Religious Services:   . Active Member of Clubs or Organizations:   . Attends Archivist Meetings:   Marland Kitchen Marital Status:   Intimate Partner Violence:   . Fear of Current or Ex-Partner:   . Emotionally Abused:   Marland Kitchen Physically Abused:   . Sexually Abused:     Family History  Problem Relation Age of Onset  . Cancer Mother   .  Heart disease Father   . Breast cancer Paternal Aunt 35     Current Outpatient Medications:  .  acetaminophen (TYLENOL) 325 MG tablet, Take 325 mg by mouth 3 (three) times daily as needed for moderate pain or headache., Disp: , Rfl:  .  amLODipine (NORVASC) 5 MG tablet, Take 1 tablet (5 mg total) by mouth daily., Disp: 90 tablet, Rfl: 1 .  aspirin EC 81 MG tablet, Take 81 mg by mouth daily., Disp: , Rfl:  .  benazepril (LOTENSIN) 40 MG tablet, Take 1 tablet (40 mg total) by mouth daily., Disp: 90 tablet, Rfl: 1 .  Cholecalciferol (VITAMIN D3) 50 MCG (2000 UT) capsule, Take 4,000 Units by mouth daily. , Disp: , Rfl:  .  ibuprofen (ADVIL) 800 MG tablet, Take 1 tablet (800 mg total) by mouth every 8 (eight) hours as needed. (Patient not taking: Reported on 11/20/2019), Disp: 30 tablet, Rfl: 0 .  lidocaine-prilocaine (EMLA) cream, Apply to affected area once (Patient taking differently: Apply 1 application topically daily as needed (port  access). ), Disp: 30 g, Rfl: 3 .  methimazole (TAPAZOLE) 5 MG tablet, Take 5 mg by mouth daily. , Disp: , Rfl:  .  metoprolol succinate (TOPROL-XL) 50 MG 24 hr tablet, Take 1 tablet (50 mg total) by mouth daily., Disp: 90 tablet, Rfl: 1 .  Multiple Vitamin (MULTI-VITAMINS) TABS, Take 1 tablet by mouth daily. , Disp: , Rfl:  .  ondansetron (ZOFRAN) 8 MG tablet, Take 1 tablet (8 mg total) by mouth 2 (two) times daily as needed for refractory nausea / vomiting., Disp: 60 tablet, Rfl: 2 .  OVER THE COUNTER MEDICATION, Take 4 capsules by mouth in the morning and at bedtime. Omega 3, resveratrol, icariin, curcumin  (Relief Factor), Disp: , Rfl:  .  Phenylephrine HCl (NEO-SYNEPHRINE NA), Place 1 spray into the nose daily as needed (congestion)., Disp: , Rfl:  .  Probiotic Product (PROBIOTIC ADVANCED PO), Take 1-2 capsules by mouth daily. , Disp: , Rfl:  .  prochlorperazine (COMPAZINE) 10 MG tablet, Take 1 tablet (10 mg total) by mouth every 6 (six) hours as needed (Nausea or vomiting)., Disp: 60 tablet, Rfl: 2 .  silver sulfADIAZINE (SILVADENE) 1 % cream, Apply 1 application topically daily. (Patient taking differently: Apply 1 application topically daily as needed (burn). ), Disp: 50 g, Rfl: 0 .  Sodium Hyaluronate, oral, (HYALURONIC ACID PO), Take 1 tablet by mouth daily., Disp: , Rfl:   Physical exam: There were no vitals filed for this visit. Physical Exam Constitutional:      Appearance: Normal appearance.  HENT:     Head: Normocephalic and atraumatic.  Eyes:     Pupils: Pupils are equal, round, and reactive to light.  Cardiovascular:     Rate and Rhythm: Normal rate and regular rhythm.     Heart sounds: Normal heart sounds. No murmur.  Pulmonary:     Effort: Pulmonary effort is normal.     Breath sounds: Normal breath sounds. No wheezing.  Abdominal:     General: Bowel sounds are normal. There is no distension.     Palpations: Abdomen is soft.     Tenderness: There is no abdominal  tenderness.  Musculoskeletal:        General: Normal range of motion.     Cervical back: Normal range of motion.  Skin:    General: Skin is warm and dry.     Findings: No rash.  Neurological:     Mental Status: She  is alert and oriented to person, place, and time.  Psychiatric:        Judgment: Judgment normal.      CMP Latest Ref Rng & Units 11/23/2019  Glucose 70 - 99 mg/dL 116(H)  BUN 8 - 23 mg/dL 5(L)  Creatinine 0.44 - 1.00 mg/dL 0.56  Sodium 135 - 145 mmol/L 142  Potassium 3.5 - 5.1 mmol/L 3.4(L)  Chloride 98 - 111 mmol/L 109  CO2 22 - 32 mmol/L 24  Calcium 8.9 - 10.3 mg/dL 8.8(L)  Total Protein 6.5 - 8.1 g/dL -  Total Bilirubin 0.3 - 1.2 mg/dL -  Alkaline Phos 38 - 126 U/L -  AST 15 - 41 U/L -  ALT 0 - 44 U/L -   CBC Latest Ref Rng & Units 11/23/2019  WBC 4.0 - 10.5 K/uL 6.9  Hemoglobin 12.0 - 15.0 g/dL 10.9(L)  Hematocrit 36.0 - 46.0 % 32.2(L)  Platelets 150 - 400 K/uL 242    No images are attached to the encounter.  DG Chest 1 View  Result Date: 10/28/2019 CLINICAL DATA:  Fluid overload, history breast cancer, hypertension EXAM: CHEST  1 VIEW COMPARISON:  Chest radiograph 05/06/2009, CT chest abdomen pelvis 10/27/2019 FINDINGS: Upper normal size of cardiac silhouette. Mediastinal contours and pulmonary vascularity normal. Lungs clear. No pulmonary infiltrate, pleural effusion or pneumothorax. Bones demineralized. Free air into the RIGHT hemidiaphragm, patient post robot assisted laparoscopic partial colectomy today. IMPRESSION: No acute abnormalities. Electronically Signed   By: Lavonia Dana M.D.   On: 10/28/2019 15:34   CT Chest W Contrast  Result Date: 10/28/2019 CLINICAL DATA:  Staging of hepatic flexure colon adenocarcinoma. History of right breast cancer with lumpectomy 12 years ago. EXAM: CT CHEST, ABDOMEN, AND PELVIS WITH CONTRAST TECHNIQUE: Multidetector CT imaging of the chest, abdomen and pelvis was performed following the standard protocol during bolus  administration of intravenous contrast. CONTRAST:  175mL OMNIPAQUE IOHEXOL 300 MG/ML  SOLN COMPARISON:  Chest CT from 07/29/2009 FINDINGS: CT CHEST FINDINGS Cardiovascular: Aortic valve calcification. Mild atherosclerotic calcification of the aortic arch. Mediastinum/Nodes: Unremarkable Lungs/Pleura: A 3 mm left lower lobe pulmonary nodule on image 109/4 was probably present on 07/29/2009 although somewhat blurred by motion artifact on that exam. Likewise a 2 mm left lower lobe nodule on image 89/4 was probably present on 07/29/2009 although slightly more indistinct. Musculoskeletal: Degenerative glenohumeral arthropathy bilaterally. Chondrocalcinosis in the left glenohumeral joint. Thoracic spondylosis. CT ABDOMEN PELVIS FINDINGS Hepatobiliary: 1.2 by 0.9 cm hypodense lesion in segment 2 of the liver on image 44/2 is stable from 07/29/2009 and highly likely to be a benign lesion such as cyst. A 0.7 cm hypodense lesion inferiorly in the right hepatic lobe on image 58/2 is technically too small to characterize. There is previously a larger cyst in the right hepatic lobe near the base of the caudate lobe on the 2011 exam, this appears to have resolved. Gallbladder absent. Pancreas: Unremarkable Spleen: Unremarkable Adrenals/Urinary Tract: Unremarkable Stomach/Bowel: Transverse, descending, and sigmoid colon diverticulosis. The hepatic flexure mass is difficult to perceive due to a similar density of contrast in the colon, but is thought to be present in the vicinity of image 66/2. Regional paracolic lymph nodes measure up to 7 mm in diameter. Vascular/Lymphatic: Aortoiliac atherosclerotic vascular disease. Reproductive: Unremarkable Other: No supplemental non-categorized findings. Musculoskeletal: Bilateral degenerative hip arthropathy with mild chondrocalcinosis. IMPRESSION: 1. The hepatic flexure mass is difficult to perceive due to a similar density of contrast in the colon, but is thought to be present in  the  vicinity of image 66/2. Regional paracolic lymph nodes measure up to 7 mm in diameter. No compelling findings of distant metastatic disease. 2. Two tiny pulmonary nodules in the left lower lobe are probably present on 07/29/2009 although slightly more indistinct on today's exam. These are probably benign but merit surveillance. 3. Stable cyst in segment 2 of the liver. A 0.7 cm hypodense lesion inferiorly in the right hepatic lobe due to small size. 4. Other imaging findings of potential clinical significance: Aortic valve calcification. Degenerative glenohumeral arthropathy bilaterally with mild chondrocalcinosis in the left glenohumeral joint. Transverse, descending, and sigmoid colon diverticulosis. 5. Aortic atherosclerosis. Aortic Atherosclerosis (ICD10-I70.0). Electronically Signed   By: Van Clines M.D.   On: 10/28/2019 12:07   CT Abdomen Pelvis W Contrast  Result Date: 10/28/2019 CLINICAL DATA:  Staging of hepatic flexure colon adenocarcinoma. History of right breast cancer with lumpectomy 12 years ago. EXAM: CT CHEST, ABDOMEN, AND PELVIS WITH CONTRAST TECHNIQUE: Multidetector CT imaging of the chest, abdomen and pelvis was performed following the standard protocol during bolus administration of intravenous contrast. CONTRAST:  174mL OMNIPAQUE IOHEXOL 300 MG/ML  SOLN COMPARISON:  Chest CT from 07/29/2009 FINDINGS: CT CHEST FINDINGS Cardiovascular: Aortic valve calcification. Mild atherosclerotic calcification of the aortic arch. Mediastinum/Nodes: Unremarkable Lungs/Pleura: A 3 mm left lower lobe pulmonary nodule on image 109/4 was probably present on 07/29/2009 although somewhat blurred by motion artifact on that exam. Likewise a 2 mm left lower lobe nodule on image 89/4 was probably present on 07/29/2009 although slightly more indistinct. Musculoskeletal: Degenerative glenohumeral arthropathy bilaterally. Chondrocalcinosis in the left glenohumeral joint. Thoracic spondylosis. CT ABDOMEN PELVIS  FINDINGS Hepatobiliary: 1.2 by 0.9 cm hypodense lesion in segment 2 of the liver on image 44/2 is stable from 07/29/2009 and highly likely to be a benign lesion such as cyst. A 0.7 cm hypodense lesion inferiorly in the right hepatic lobe on image 58/2 is technically too small to characterize. There is previously a larger cyst in the right hepatic lobe near the base of the caudate lobe on the 2011 exam, this appears to have resolved. Gallbladder absent. Pancreas: Unremarkable Spleen: Unremarkable Adrenals/Urinary Tract: Unremarkable Stomach/Bowel: Transverse, descending, and sigmoid colon diverticulosis. The hepatic flexure mass is difficult to perceive due to a similar density of contrast in the colon, but is thought to be present in the vicinity of image 66/2. Regional paracolic lymph nodes measure up to 7 mm in diameter. Vascular/Lymphatic: Aortoiliac atherosclerotic vascular disease. Reproductive: Unremarkable Other: No supplemental non-categorized findings. Musculoskeletal: Bilateral degenerative hip arthropathy with mild chondrocalcinosis. IMPRESSION: 1. The hepatic flexure mass is difficult to perceive due to a similar density of contrast in the colon, but is thought to be present in the vicinity of image 66/2. Regional paracolic lymph nodes measure up to 7 mm in diameter. No compelling findings of distant metastatic disease. 2. Two tiny pulmonary nodules in the left lower lobe are probably present on 07/29/2009 although slightly more indistinct on today's exam. These are probably benign but merit surveillance. 3. Stable cyst in segment 2 of the liver. A 0.7 cm hypodense lesion inferiorly in the right hepatic lobe due to small size. 4. Other imaging findings of potential clinical significance: Aortic valve calcification. Degenerative glenohumeral arthropathy bilaterally with mild chondrocalcinosis in the left glenohumeral joint. Transverse, descending, and sigmoid colon diverticulosis. 5. Aortic  atherosclerosis. Aortic Atherosclerosis (ICD10-I70.0). Electronically Signed   By: Van Clines M.D.   On: 10/28/2019 12:07   DG Chest Port 1 View  Result Date:  11/23/2019 CLINICAL DATA:  Port-A-Cath placement EXAM: PORTABLE CHEST 1 VIEW COMPARISON:  10/28/2019 FINDINGS: Right Port-A-Cath has been placed with the tip at the cavoatrial junction. No pneumothorax. Heart is normal size. No confluent opacities or effusions. IMPRESSION: Right Port-A-Cath placement with the tip at the cavoatrial junction. No pneumothorax. Electronically Signed   By: Rolm Baptise M.D.   On: 11/23/2019 08:54   DG C-Arm 1-60 Min-No Report  Result Date: 11/23/2019 Fluoroscopy was utilized by the requesting physician.  No radiographic interpretation.     Assessment and plan- Patient is a 79 y.o. female who presents to [facility] for initial meeting in preparation for starting chemotherapy for the treatment of colon cancer.    1. HPI: Mrs. 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  Work-up included colonoscopy by Dr. Verl Blalock which revealed a circumferential mass at the hepatic flexure.  Pathology confirmed adenocarcinoma.  Underwent right hemicolectomy with Dr. Christian Mate on 10/22/2019.  Plan is for adjuvant FOLFOX every 2 weeks for total of 12 cycles.  Plan is to begin next week.  2. Chemo Care Clinic/High Risk for ER/Hospitalization during chemotherapy- We discussed the role of the chemo care clinic and identified patient specific risk factors. I discussed that patient was identified as high risk primarily based on: co morbidities and age  Patient has past medical history positive for: Past Medical History:  Diagnosis Date  . Anemia   . Arthritis   . Basal cell carcinoma    back  . Breast cancer ([place]) 2010   RT LUMPECTOMY  . Cancer ([place])    rt breast  . Chemotherapy  induced nausea and vomiting   . Heart murmur   . Hypertension   . Hypothyroidism    currently not on meds  . Osteopenia   . Personal history of chemotherapy 2010   right breast ca  . Personal history of radiation therapy 2010   right breast ca  . Port-A-Cath in place     Patient has past surgical history positive for: Past Surgical History:  Procedure Laterality Date  . BACK SURGERY    . BREAST EXCISIONAL BIOPSY Right 03/2009   radiation and chemo +  . BREAST EXCISIONAL BIOPSY Bilateral    NEG  . BREAST SURGERY    . CATARACT EXTRACTION W/ INTRAOCULAR LENS  IMPLANT, BILATERAL    . CHOLECYSTECTOMY    . COLONOSCOPY WITH PROPOFOL N/A 10/19/2019   Procedure: COLONOSCOPY WITH PROPOFOL-attempted, unable to perform poor prep;  Surgeon: Lucilla Lame, MD;  Location: [place];  Service: Endoscopy;  Laterality: N/A;  priority 3  . COLONOSCOPY WITH PROPOFOL N/A 10/20/2019   Procedure: COLONOSCOPY WITH PROPOFOL;  Surgeon: Lucilla Lame, MD;  [facility] ENDOSCOPY;  Service: Endoscopy;  Laterality: N/A;  . ESOPHAGEAL DILATION  10/19/2019   Procedure: ESOPHAGEAL DILATION;  Surgeon: Lucilla Lame, MD;  Location: [place];  Service: Endoscopy;;  . ESOPHAGOGASTRODUODENOSCOPY (EGD) WITH PROPOFOL N/A 10/19/2019   Procedure: ESOPHAGOGASTRODUODENOSCOPY (EGD) WITH PROPOFOL;  Surgeon: Lucilla Lame, MD;  Location: [place];  Service: Endoscopy;  Laterality: N/A;  . PORTACATH PLACEMENT Right 11/23/2019   Procedure: INSERTION PORT-A-CATH;  Surgeon: Ronny Bacon, MD;  Location: [facility] ORS;  Service: General;  Laterality: Right;    Based on our high risk symptom management report; this patient has a high risk of ED utilization.  The percentage below indicates how "  at risk "  this patient based on the factors in this table within one year.   General Risk Score: 4  Values used to calculate this score:   Points  Metrics      1        Age: [facility] Admissions: 5       0        ED Visits: 0      0        Has Chronic Obstructive Pulmonary Disease: No      0        Has Diabetes: No      0        Has Congestive Heart Failure: No      0        Has liver disease: No      0        Has Depression: No      0        Current PCP: Valerie Roys, DO      0        Has Medicaid: No   3. We discussed that social determinants of health may have significant impacts on health and outcomes for cancer patients.  Today we discussed specific social determinants of performance status, alcohol use, depression, financial needs, food insecurity, housing, interpersonal violence, social connections, stress, tobacco use, and transportation.    After lengthy discussion the following were identified as areas of need: None at this time  Outpatient services: We discussed options including home based and outpatient services, DME and care program. We discusssed that patients who participate in regular physical activity report fewer negative impacts of cancer and treatments and report less fatigue.   Financial Concerns: We discussed that living with cancer can create tremendous financial burden.  We discussed options for assistance. I asked that if assistance is needed in affording medications or paying bills to please let us know so that we can provide assistance. We discussed options for food including social services, [employer] ($50 every 2 weeks) and onsite food pantry.  We will also notify [facility] to see if cancer center can provide additional support.  Referral to Social work: Introduced Education officer, museum Elease Etienne and the services he can provide such as support with [employer], cell phone and gas vouchers.   Support groups: We discussed options for support groups at the cancer center. If interested, please notify nurse navigator to enroll. We discussed options for managing stress including healthy eating, exercise as well as participating in no charge counseling  services at the cancer center and support groups.  If these are of interest, patient can notify either myself or primary nursing team.We discussed options for management including medications and referral to quit Smart program  Transportation: We discussed options for transportation including acta, paratransit, bus routes, link transit, taxi/uber/lyft, and cancer center [place].  I have notified primary oncology team who will help assist with arranging Lucianne Lei transportation for appointments when/if needed. We also discussed options for transportation on short notice/acute visits.  Palliative care services: We have palliative care services available in the cancer center to discuss goals of care and advanced care planning.  Please let us know if you have any questions or would like to speak to our palliative nurse practitioner.  Symptom Management Clinic: We discussed our symptom management clinic which is available for acute concerns while receiving treatment  such as nausea, vomiting or diarrhea.  We can be reached via telephone at [phone] or through my chart.  We are available for virtual or in person visits on the same day from 830 to 4 PM Monday through Friday. She denies needing specific assistance at this time and She will be followed by Dr. Gary Fleet clinical team.  Plan: Discussed symptom management clinic. Discussed palliative care services. Discussed resources that are available here at the cancer center. Discussed medications and new prescriptions to begin treatment such as anti-nausea or steroids.   Disposition: RTC on 11/30/19 for labs, MD assessment and cycle 1 of FOLFOX. RTC on 12/02/2019 for pump removal.  Visit Diagnosis 1. Malignant neoplasm of colon, unspecified part of colon [facility])     Patient expressed understanding and was in agreement with this plan. She also understands that She can call clinic at any time with any questions, concerns, or complaints.   Greater than 50% was spent  in counseling and coordination of care with this patient including but not limited to discussion of the relevant topics above (See A&P) including, but not limited to diagnosis and management of acute and chronic medical conditions.   [place] [facility] [place]  CC: Dr. Grayland Ormond

## 2019-11-27 ENCOUNTER — Encounter: Payer: Self-pay | Admitting: Oncology

## 2019-11-27 NOTE — Progress Notes (Signed)
Patient called for pre assessment. She denies any pain or concerns at this time.  

## 2019-11-28 NOTE — Progress Notes (Signed)
[place]  Telephone:(336) [phone] Fax:(336) [phone]  ID: Maria Davies OB: 05/24/1941  MR#: [id number]  CNO#:[id number]  Patient Care Team: Valerie Roys, DO as PCP - General (Family Medicine) Guadalupe Maple, MD as PCP - Family Medicine (Family Medicine) Ronny Bacon, MD as Consulting Physician (General Surgery) Lloyd Huger, MD as Consulting Physician (Oncology)  CHIEF COMPLAINT: Stage IIIb right colon cancer.  INTERVAL HISTORY: Patient returns to clinic today for further evaluation and to initiate cycle 1 of 12 of adjuvant FOLFOX.  She currently feels well and is asymptomatic.  She had a port placement last week without problem.  She has no neurologic complaints.  She denies any recent fevers or illnesses.  She has a good appetite and denies weight loss.  She has no chest pain, shortness of breath, cough, or hemoptysis.  She denies any nausea, vomiting, constipation, or diarrhea.  She has no melena or hematochezia.  She has no urinary complaints.  Patient offers no specific complaints today.  REVIEW OF SYSTEMS:   Review of Systems  Constitutional: Negative.  Negative for fever, malaise/fatigue and weight loss.  Respiratory: Negative.  Negative for cough, hemoptysis and shortness of breath.   Cardiovascular: Negative.  Negative for chest pain and leg swelling.  Gastrointestinal: Negative.  Negative for abdominal pain, blood in stool and melena.  Genitourinary: Negative.  Negative for hematuria.  Musculoskeletal: Negative.  Negative for back pain.  Skin: Negative.  Negative for rash.  Neurological: Negative.  Negative for dizziness, focal weakness, weakness and headaches.  Psychiatric/Behavioral: Negative.  The patient is not nervous/anxious.     As per HPI. Otherwise, a complete review of systems is negative.  PAST MEDICAL HISTORY: Past Medical History:  Diagnosis Date  . Anemia   . Arthritis   . Basal cell carcinoma    back  . Breast  cancer ([place]) 2010   RT LUMPECTOMY  . Cancer ([place])    rt breast  . Chemotherapy induced nausea and vomiting   . Heart murmur   . Hypertension   . Hypothyroidism    currently not on meds  . Osteopenia   . Personal history of chemotherapy 2010   right breast ca  . Personal history of radiation therapy 2010   right breast ca  . Port-A-Cath in place     PAST SURGICAL HISTORY: Past Surgical History:  Procedure Laterality Date  . BACK SURGERY    . BREAST EXCISIONAL BIOPSY Right 03/2009   radiation and chemo +  . BREAST EXCISIONAL BIOPSY Bilateral    NEG  . BREAST SURGERY    . CATARACT EXTRACTION W/ INTRAOCULAR LENS  IMPLANT, BILATERAL    . CHOLECYSTECTOMY    . COLONOSCOPY WITH PROPOFOL N/A 10/19/2019   Procedure: COLONOSCOPY WITH PROPOFOL-attempted, unable to perform poor prep;  Surgeon: Lucilla Lame, MD;  Location: [place];  Service: Endoscopy;  Laterality: N/A;  priority 3  . COLONOSCOPY WITH PROPOFOL N/A 10/20/2019   Procedure: COLONOSCOPY WITH PROPOFOL;  Surgeon: Lucilla Lame, MD;  Location: [facility] ENDOSCOPY;  Service: Endoscopy;  Laterality: N/A;  . ESOPHAGEAL DILATION  10/19/2019   Procedure: ESOPHAGEAL DILATION;  Surgeon: Lucilla Lame, MD;  Location: [place];  Service: Endoscopy;;  . ESOPHAGOGASTRODUODENOSCOPY (EGD) WITH PROPOFOL N/A 10/19/2019   Procedure: ESOPHAGOGASTRODUODENOSCOPY (EGD) WITH PROPOFOL;  Surgeon: Lucilla Lame, MD;  Location: [place];  Service: Endoscopy;  Laterality: N/A;  . PORTACATH PLACEMENT Right 11/23/2019   Procedure: INSERTION PORT-A-CATH;  Surgeon: Ronny Bacon, MD;  Location:  [facility]S;  Service: General;  Laterality: Right;    FAMILY HISTORY: Family History  Problem Relation Age of Onset  . Cancer Mother   . Heart disease Father   . Breast cancer Paternal Aunt 65    ADVANCED DIRECTIVES (Y/N):  N  HEALTH MAINTENANCE: Social History   Tobacco Use  . Smoking status: Never Smoker  . Smokeless tobacco: Never Used   Vaping Use  . Vaping Use: Never used  Substance Use Topics  . Alcohol use: Not Currently    Alcohol/week: 0.0 standard drinks    Comment: No alcohol since 08/05/19  . Drug use: No     Colonoscopy:  PAP:  Bone density:  Lipid panel:  No Known Allergies  Current Outpatient Medications  Medication Sig Dispense Refill  . acetaminophen (TYLENOL) 325 MG tablet Take 325 mg by mouth 3 (three) times daily as needed for moderate pain or headache.    Marland Kitchen amLODipine (NORVASC) 5 MG tablet Take 1 tablet (5 mg total) by mouth daily. 90 tablet 1  . aspirin EC 81 MG tablet Take 81 mg by mouth daily.    . benazepril (LOTENSIN) 40 MG tablet Take 1 tablet (40 mg total) by mouth daily. 90 tablet 1  . Cholecalciferol (VITAMIN D3) 50 MCG (2000 UT) capsule Take 4,000 Units by mouth daily.     Marland Kitchen lidocaine-prilocaine (EMLA) cream Apply to affected area once (Patient taking differently: Apply 1 application topically daily as needed (port access). ) 30 g 3  . methimazole (TAPAZOLE) 5 MG tablet Take 5 mg by mouth daily.     . metoprolol succinate (TOPROL-XL) 50 MG 24 hr tablet Take 1 tablet (50 mg total) by mouth daily. 90 tablet 1  . Multiple Vitamin (MULTI-VITAMINS) TABS Take 1 tablet by mouth daily.     . ondansetron (ZOFRAN) 8 MG tablet Take 1 tablet (8 mg total) by mouth 2 (two) times daily as needed for refractory nausea / vomiting. 60 tablet 2  . OVER THE COUNTER MEDICATION Take 4 capsules by mouth in the morning and at bedtime. Omega 3, resveratrol, icariin, curcumin  (Relief Factor)    . Phenylephrine HCl (NEO-SYNEPHRINE NA) Place 1 spray into the nose daily as needed (congestion).    . Probiotic Product (PROBIOTIC ADVANCED PO) Take 1-2 capsules by mouth daily. When taking antibiotics pt will take 2 capsules instead of 1, normally just takes 1 per day otherwise    . prochlorperazine (COMPAZINE) 10 MG tablet Take 1 tablet (10 mg total) by mouth every 6 (six) hours as needed (Nausea or vomiting). 60 tablet 2   . silver sulfADIAZINE (SILVADENE) 1 % cream Apply 1 application topically daily. (Patient taking differently: Apply 1 application topically daily as needed (burn). ) 50 g 0  . Sodium Hyaluronate, oral, (HYALURONIC ACID PO) Take 1 tablet by mouth daily.     No current facility-administered medications for this visit.   Facility-Administered Medications Ordered in Other Visits  Medication Dose Route Frequency Provider Last Rate Last Admin  . fluorouracil (ADRUCIL) 4,450 mg in sodium chloride 0.9 % 61 mL chemo infusion  2,400 mg/m2 (Treatment Plan Recorded) Intravenous 1 day or 1 dose Lloyd Huger, MD   4,450 mg at 11/30/19 1233    OBJECTIVE: Vitals:   11/30/19 0846  BP: 127/61  Pulse: 84  Resp: 20  Temp: 97.7 F (36.5 C)  SpO2: 100%     Body mass index is 30.31 kg/m.    ECOG FS:0 - Asymptomatic  General:  Well-developed, well-nourished, no acute distress. Eyes: Pink conjunctiva, anicteric sclera. HEENT: Normocephalic, moist mucous membranes. Lungs: No audible wheezing or coughing. Heart: Regular rate and rhythm. Abdomen: Soft, nontender, no obvious distention. Musculoskeletal: No edema, cyanosis, or clubbing. Neuro: Alert, answering all questions appropriately. Cranial nerves grossly intact. Skin: No rashes or petechiae noted. Psych: Normal affect.  LAB RESULTS:  Lab Results  Component Value Date   NA 139 11/30/2019   K 3.5 11/30/2019   CL 107 11/30/2019   CO2 23 11/30/2019   GLUCOSE 113 (H) 11/30/2019   BUN 9 11/30/2019   CREATININE 0.48 11/30/2019   CALCIUM 9.0 11/30/2019   PROT 6.4 (L) 11/30/2019   ALBUMIN 3.6 11/30/2019   AST 17 11/30/2019   ALT 13 11/30/2019   ALKPHOS 74 11/30/2019   BILITOT 0.8 11/30/2019   GFRNONAA >60 11/30/2019   GFRAA >60 11/30/2019    Lab Results  Component Value Date   WBC 7.1 11/30/2019   NEUTROABS 4.3 11/30/2019   HGB 11.1 (L) 11/30/2019   HCT 32.4 (L) 11/30/2019   MCV 89.5 11/30/2019   PLT 257 11/30/2019      STUDIES: DG Chest Port 1 View  Result Date: 11/23/2019 CLINICAL DATA:  Port-A-Cath placement EXAM: PORTABLE CHEST 1 VIEW COMPARISON:  10/28/2019 FINDINGS: Right Port-A-Cath has been placed with the tip at the cavoatrial junction. No pneumothorax. Heart is normal size. No confluent opacities or effusions. IMPRESSION: Right Port-A-Cath placement with the tip at the cavoatrial junction. No pneumothorax. Electronically Signed   By: Rolm Baptise M.D.   On: 11/23/2019 08:54   DG C-Arm 1-60 Min-No Report  Result Date: 11/23/2019 Fluoroscopy was utilized by the requesting physician.  No radiographic interpretation.    ASSESSMENT: Stage IIIb right colon cancer  PLAN:    1. Stage IIIb right colon cancer: Pathology results reviewed, confirming stage of disease.  CT scan results from Oct 28, 2019 reviewed independently  with no obvious evidence of metastatic disease.  Patient has now had port placement.  Proceed with cycle 1 of 12 of adjuvant FOLFOX today.  Return to clinic in 2 days for pump removal, 1 week for laboratory work and further evaluation, and then in 2 weeks for further evaluation and consideration of cycle 2.   2.  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  She completed 5 years of anastrozole in June 2016.  Her most recent mammogram on February 02, 2019 was reported as BI-RADS 1.  Repeat in August 2021. 3.  Anemia: Mild.  Hemoglobin 11.1 today, monitor.  Patient expressed understanding and was in agreement with this plan. She also understands that She can call clinic at any time with any questions, concerns, or complaints.   Cancer Staging Colon cancer [facility]) Staging form: Colon and Rectum, AJCC 8th Edition - Clinical stage from 11/13/2019: Stage IIIB (cT3, cN1a, cM0) - Signed by Lloyd Huger, MD on 11/13/2019   Lloyd Huger, MD   11/30/2019 1:40 PM

## 2019-11-30 ENCOUNTER — Other Ambulatory Visit: Payer: Self-pay

## 2019-11-30 ENCOUNTER — Inpatient Hospital Stay: Payer: Medicare Other

## 2019-11-30 ENCOUNTER — Inpatient Hospital Stay (HOSPITAL_BASED_OUTPATIENT_CLINIC_OR_DEPARTMENT_OTHER): Payer: Medicare Other | Admitting: Oncology

## 2019-11-30 ENCOUNTER — Encounter: Payer: Self-pay | Admitting: Oncology

## 2019-11-30 VITALS — BP 127/61 | HR 84 | Temp 97.7°F | Resp 20 | Wt 171.1 lb

## 2019-11-30 DIAGNOSIS — Z5111 Encounter for antineoplastic chemotherapy: Secondary | ICD-10-CM | POA: Diagnosis not present

## 2019-11-30 DIAGNOSIS — C182 Malignant neoplasm of ascending colon: Secondary | ICD-10-CM

## 2019-11-30 DIAGNOSIS — C189 Malignant neoplasm of colon, unspecified: Secondary | ICD-10-CM | POA: Diagnosis not present

## 2019-11-30 LAB — CBC WITH DIFFERENTIAL/PLATELET
Abs Immature Granulocytes: 0.03 10*3/uL (ref 0.00–0.07)
Basophils Absolute: 0.1 10*3/uL (ref 0.0–0.1)
Basophils Relative: 1 %
Eosinophils Absolute: 0.2 10*3/uL (ref 0.0–0.5)
Eosinophils Relative: 3 %
HCT: 32.4 % — ABNORMAL LOW (ref 36.0–46.0)
Hemoglobin: 11.1 g/dL — ABNORMAL LOW (ref 12.0–15.0)
Immature Granulocytes: 0 %
Lymphocytes Relative: 25 %
Lymphs Abs: 1.7 10*3/uL (ref 0.7–4.0)
MCH: 30.7 pg (ref 26.0–34.0)
MCHC: 34.3 g/dL (ref 30.0–36.0)
MCV: 89.5 fL (ref 80.0–100.0)
Monocytes Absolute: 0.7 10*3/uL (ref 0.1–1.0)
Monocytes Relative: 10 %
Neutro Abs: 4.3 10*3/uL (ref 1.7–7.7)
Neutrophils Relative %: 61 %
Platelets: 257 10*3/uL (ref 150–400)
RBC: 3.62 MIL/uL — ABNORMAL LOW (ref 3.87–5.11)
RDW: 14.7 % (ref 11.5–15.5)
WBC: 7.1 10*3/uL (ref 4.0–10.5)
nRBC: 0 % (ref 0.0–0.2)

## 2019-11-30 LAB — COMPREHENSIVE METABOLIC PANEL
ALT: 13 U/L (ref 0–44)
AST: 17 U/L (ref 15–41)
Albumin: 3.6 g/dL (ref 3.5–5.0)
Alkaline Phosphatase: 74 U/L (ref 38–126)
Anion gap: 9 (ref 5–15)
BUN: 9 mg/dL (ref 8–23)
CO2: 23 mmol/L (ref 22–32)
Calcium: 9 mg/dL (ref 8.9–10.3)
Chloride: 107 mmol/L (ref 98–111)
Creatinine, Ser: 0.48 mg/dL (ref 0.44–1.00)
GFR calc Af Amer: >60 mL/min (ref >60)
GFR calc non Af Amer: >60 mL/min (ref >60)
Glucose, Bld: 113 mg/dL — ABNORMAL HIGH (ref 70–99)
Potassium: 3.5 mmol/L (ref 3.5–5.1)
Sodium: 139 mmol/L (ref 135–145)
Total Bilirubin: 0.8 mg/dL (ref 0.3–1.2)
Total Protein: 6.4 g/dL — ABNORMAL LOW (ref 6.5–8.1)

## 2019-11-30 MED ORDER — FLUOROURACIL CHEMO INJECTION 2.5 GM/50ML
400.0000 mg/m2 | Freq: Once | INTRAVENOUS | Status: AC
  Administered 2019-11-30: 750 mg via INTRAVENOUS
  Filled 2019-11-30: qty 15

## 2019-11-30 MED ORDER — LEUCOVORIN CALCIUM INJECTION 350 MG
750.0000 mg | Freq: Once | INTRAVENOUS | Status: AC
  Administered 2019-11-30: 750 mg via INTRAVENOUS
  Filled 2019-11-30: qty 25

## 2019-11-30 MED ORDER — DEXTROSE 5 % IV SOLN
Freq: Once | INTRAVENOUS | Status: AC
  Filled 2019-11-30: qty 250

## 2019-11-30 MED ORDER — SODIUM CHLORIDE 0.9 % IV SOLN
10.0000 mg | Freq: Once | INTRAVENOUS | Status: AC
  Administered 2019-11-30: 10 mg via INTRAVENOUS
  Filled 2019-11-30: qty 10

## 2019-11-30 MED ORDER — PALONOSETRON HCL INJECTION 0.25 MG/5ML
0.2500 mg | Freq: Once | INTRAVENOUS | Status: AC
  Administered 2019-11-30: 0.25 mg via INTRAVENOUS
  Filled 2019-11-30: qty 5

## 2019-11-30 MED ORDER — SODIUM CHLORIDE 0.9 % IV SOLN
2400.0000 mg/m2 | INTRAVENOUS | Status: DC
  Administered 2019-11-30: 4450 mg via INTRAVENOUS
  Filled 2019-11-30: qty 89

## 2019-11-30 MED ORDER — OXALIPLATIN CHEMO INJECTION 100 MG/20ML
150.0000 mg | Freq: Once | INTRAVENOUS | Status: AC
  Administered 2019-11-30: 150 mg via INTRAVENOUS
  Filled 2019-11-30: qty 20

## 2019-11-30 NOTE — Progress Notes (Signed)
Upon port assessment, it was noticed that patient's incision line was minimally separated. No redness, tenderness, drainage, or foul odor noted. Patient denies any symptoms. Steri Strips applied to area and patient advised to monitor area closely.

## 2019-11-30 NOTE — Progress Notes (Signed)
Pt tolerated infusion well. Pt educated on home infusion pump. No s/s of distress or reaction noted. Pt stable at time of discharge.

## 2019-12-01 ENCOUNTER — Telehealth: Payer: Self-pay

## 2019-12-01 NOTE — Telephone Encounter (Signed)
Telephone call to patient for follow up after receiving first infusion.   Patient states infusion went great.  States eating good and drinking plenty of fluids.   Denies any nausea or vomiting.  Encouraged patient to call for any concerns or questions. 

## 2019-12-02 ENCOUNTER — Inpatient Hospital Stay: Payer: Medicare Other

## 2019-12-02 ENCOUNTER — Other Ambulatory Visit: Payer: Self-pay

## 2019-12-02 DIAGNOSIS — C182 Malignant neoplasm of ascending colon: Secondary | ICD-10-CM

## 2019-12-02 DIAGNOSIS — Z5111 Encounter for antineoplastic chemotherapy: Secondary | ICD-10-CM | POA: Diagnosis not present

## 2019-12-02 MED ORDER — SODIUM CHLORIDE 0.9% FLUSH
10.0000 mL | INTRAVENOUS | Status: DC | PRN
  Administered 2019-12-02: 10 mL
  Filled 2019-12-02: qty 10

## 2019-12-02 MED ORDER — HEPARIN SOD (PORK) LOCK FLUSH 100 UNIT/ML IV SOLN
500.0000 [IU] | Freq: Once | INTRAVENOUS | Status: AC | PRN
  Administered 2019-12-02: 500 [IU]
  Filled 2019-12-02: qty 5

## 2019-12-02 MED ORDER — HEPARIN SOD (PORK) LOCK FLUSH 100 UNIT/ML IV SOLN
INTRAVENOUS | Status: AC
  Filled 2019-12-02: qty 5

## 2019-12-03 NOTE — Progress Notes (Signed)
[place]  Telephone:(336) [phone] Fax:(336) [phone]  ID: Maria Davies OB: 06-08-1941  MR#: [id number]  YIR#:[id number]  Patient Care Team: Valerie Roys, DO as PCP - General (Family Medicine) Guadalupe Maple, MD as PCP - Family Medicine (Family Medicine) Ronny Bacon, MD as Consulting Physician (General Surgery) Lloyd Huger, MD as Consulting Physician (Oncology)  CHIEF COMPLAINT: Stage IIIb right colon cancer.  INTERVAL HISTORY: Patient returns to clinic today for further evaluation and to assess her toleration of cycle 1 of adjuvant FOLFOX.  She tolerated her treatment well without significant side effects.  She had mild nausea on day 2 after treatment, but this quickly resolved with Compazine.  She currently feels well and is asymptomatic.  She has no neurologic complaints.  She denies any recent fevers or illnesses.  She has a good appetite and denies weight loss.  She has no chest pain, shortness of breath, cough, or hemoptysis. She denies any nausea, vomiting, constipation, or diarrhea.  She has no melena or hematochezia.  She has no urinary complaints.  Patient offers no further specific complaints today.  REVIEW OF SYSTEMS:   Review of Systems  Constitutional: Negative.  Negative for fever, malaise/fatigue and weight loss.  Respiratory: Negative.  Negative for cough, hemoptysis and shortness of breath.   Cardiovascular: Negative.  Negative for chest pain and leg swelling.  Gastrointestinal: Negative.  Negative for abdominal pain, blood in stool and melena.  Genitourinary: Negative.  Negative for hematuria.  Musculoskeletal: Negative.  Negative for back pain.  Skin: Negative.  Negative for rash.  Neurological: Negative.  Negative for dizziness, focal weakness, weakness and headaches.  Psychiatric/Behavioral: Negative.  The patient is not nervous/anxious.     As per HPI. Otherwise, a complete review of systems is negative.  PAST MEDICAL  HISTORY: Past Medical History:  Diagnosis Date  . Anemia   . Arthritis   . Basal cell carcinoma    back  . Breast cancer ([place]) 2010   RT LUMPECTOMY  . Cancer ([place])    rt breast  . Chemotherapy induced nausea and vomiting   . Heart murmur   . Hypertension   . Hypothyroidism    currently not on meds  . Osteopenia   . Personal history of chemotherapy 2010   right breast ca  . Personal history of radiation therapy 2010   right breast ca  . Port-A-Cath in place     PAST SURGICAL HISTORY: Past Surgical History:  Procedure Laterality Date  . BACK SURGERY    . BREAST EXCISIONAL BIOPSY Right 03/2009   radiation and chemo +  . BREAST EXCISIONAL BIOPSY Bilateral    NEG  . BREAST SURGERY    . CATARACT EXTRACTION W/ INTRAOCULAR LENS  IMPLANT, BILATERAL    . CHOLECYSTECTOMY    . COLONOSCOPY WITH PROPOFOL N/A 10/19/2019   Procedure: COLONOSCOPY WITH PROPOFOL-attempted, unable to perform poor prep;  Surgeon: Lucilla Lame, MD;  Location: [place];  Service: Endoscopy;  Laterality: N/A;  priority 3  . COLONOSCOPY WITH PROPOFOL N/A 10/20/2019   Procedure: COLONOSCOPY WITH PROPOFOL;  Surgeon: Lucilla Lame, MD;  Location: [facility] ENDOSCOPY;  Service: Endoscopy;  Laterality: N/A;  . ESOPHAGEAL DILATION  10/19/2019   Procedure: ESOPHAGEAL DILATION;  Surgeon: Lucilla Lame, MD;  Location: [place];  Service: Endoscopy;;  . ESOPHAGOGASTRODUODENOSCOPY (EGD) WITH PROPOFOL N/A 10/19/2019   Procedure: ESOPHAGOGASTRODUODENOSCOPY (EGD) WITH PROPOFOL;  Surgeon: Lucilla Lame, MD;  Location: [place];  Service: Endoscopy;  Laterality: N/A;  .  PORTACATH PLACEMENT Right 11/23/2019   Procedure: INSERTION PORT-A-CATH;  Surgeon: Ronny Bacon, MD;  Location: [facility]S;  Service: General;  Laterality: Right;    FAMILY HISTORY: Family History  Problem Relation Age of Onset  . Cancer Mother   . Heart disease Father   . Breast cancer Paternal Aunt 67    ADVANCED DIRECTIVES (Y/N):   N  HEALTH MAINTENANCE: Social History   Tobacco Use  . Smoking status: Never Smoker  . Smokeless tobacco: Never Used  Vaping Use  . Vaping Use: Never used  Substance Use Topics  . Alcohol use: Not Currently    Alcohol/week: 0.0 standard drinks    Comment: No alcohol since 08/05/19  . Drug use: No     Colonoscopy:  PAP:  Bone density:  Lipid panel:  No Known Allergies  Current Outpatient Medications  Medication Sig Dispense Refill  . acetaminophen (TYLENOL) 325 MG tablet Take 325 mg by mouth 3 (three) times daily as needed for moderate pain or headache.    Marland Kitchen amLODipine (NORVASC) 5 MG tablet Take 1 tablet (5 mg total) by mouth daily. 90 tablet 1  . aspirin EC 81 MG tablet Take 81 mg by mouth daily.    . benazepril (LOTENSIN) 40 MG tablet Take 1 tablet (40 mg total) by mouth daily. 90 tablet 1  . Cholecalciferol (VITAMIN D3) 50 MCG (2000 UT) capsule Take 4,000 Units by mouth daily.     Marland Kitchen lidocaine-prilocaine (EMLA) cream Apply to affected area once (Patient taking differently: Apply 1 application topically daily as needed (port access). ) 30 g 3  . methimazole (TAPAZOLE) 5 MG tablet Take 5 mg by mouth daily.     . metoprolol succinate (TOPROL-XL) 50 MG 24 hr tablet Take 1 tablet (50 mg total) by mouth daily. 90 tablet 1  . Multiple Vitamin (MULTI-VITAMINS) TABS Take 1 tablet by mouth daily.     . ondansetron (ZOFRAN) 8 MG tablet Take 1 tablet (8 mg total) by mouth 2 (two) times daily as needed for refractory nausea / vomiting. 60 tablet 2  . OVER THE COUNTER MEDICATION Take 4 capsules by mouth in the morning and at bedtime. Omega 3, resveratrol, icariin, curcumin  (Relief Factor)    . Phenylephrine HCl (NEO-SYNEPHRINE NA) Place 1 spray into the nose daily as needed (congestion).    . Probiotic Product (PROBIOTIC ADVANCED PO) Take 1-2 capsules by mouth daily. When taking antibiotics pt will take 2 capsules instead of 1, normally just takes 1 per day otherwise    . prochlorperazine  (COMPAZINE) 10 MG tablet Take 1 tablet (10 mg total) by mouth every 6 (six) hours as needed (Nausea or vomiting). 60 tablet 2  . silver sulfADIAZINE (SILVADENE) 1 % cream Apply 1 application topically daily. (Patient taking differently: Apply 1 application topically daily as needed (burn). ) 50 g 0  . Sodium Hyaluronate, oral, (HYALURONIC ACID PO) Take 1 tablet by mouth daily.     No current facility-administered medications for this visit.    OBJECTIVE: Vitals:   12/07/19 1025  BP: 104/73  Pulse: 89  Resp: 20  Temp: (!) 96.5 F (35.8 C)  SpO2: 100%     Body mass index is 29.3 kg/m.    ECOG FS:0 - Asymptomatic  General: Well-developed, well-nourished, no acute distress. Eyes: Pink conjunctiva, anicteric sclera. HEENT: Normocephalic, moist mucous membranes. Lungs: No audible wheezing or coughing. Heart: Regular rate and rhythm. Abdomen: Soft, nontender, no obvious distention. Musculoskeletal: No edema, cyanosis, or clubbing. Neuro:  Alert, answering all questions appropriately. Cranial nerves grossly intact. Skin: No rashes or petechiae noted. Psych: Normal affect.  LAB RESULTS:  Lab Results  Component Value Date   NA 137 12/07/2019   K 3.6 12/07/2019   CL 103 12/07/2019   CO2 24 12/07/2019   GLUCOSE 113 (H) 12/07/2019   BUN 8 12/07/2019   CREATININE 0.37 (L) 12/07/2019   CALCIUM 8.9 12/07/2019   PROT 6.8 12/07/2019   ALBUMIN 3.5 12/07/2019   AST 26 12/07/2019   ALT 26 12/07/2019   ALKPHOS 88 12/07/2019   BILITOT 0.9 12/07/2019   GFRNONAA >60 12/07/2019   GFRAA >60 12/07/2019    Lab Results  Component Value Date   WBC 11.6 (H) 12/07/2019   NEUTROABS 8.5 (H) 12/07/2019   HGB 11.6 (L) 12/07/2019   HCT 33.7 (L) 12/07/2019   MCV 88.9 12/07/2019   PLT 297 12/07/2019     STUDIES: DG Chest Port 1 View  Result Date: 11/23/2019 CLINICAL DATA:  Port-A-Cath placement EXAM: PORTABLE CHEST 1 VIEW COMPARISON:  10/28/2019 FINDINGS: Right Port-A-Cath has been placed  with the tip at the cavoatrial junction. No pneumothorax. Heart is normal size. No confluent opacities or effusions. IMPRESSION: Right Port-A-Cath placement with the tip at the cavoatrial junction. No pneumothorax. Electronically Signed   By: Rolm Baptise M.D.   On: 11/23/2019 08:54   DG C-Arm 1-60 Min-No Report  Result Date: 11/23/2019 Fluoroscopy was utilized by the requesting physician.  No radiographic interpretation.    ASSESSMENT: Stage IIIb right colon cancer  PLAN:    1. Stage IIIb right colon cancer: Pathology results reviewed, confirming stage of disease.  CT scan results from Oct 28, 2019 reviewed independently  with no obvious evidence of metastatic disease.  Patient has now had port placement.  Patient tolerated cycle 1 of 12 of adjuvant FOLFOX last week.  Return to clinic in 1 week for further evaluation and consideration of cycle 2.  2.  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  She completed 5 years of anastrozole in June 2016.  Her most recent mammogram on February 02, 2019 was reported as BI-RADS 1.  Repeat in August 2021. 3.  Anemia: Mild.  Hemoglobin has trended up slightly to 11.6.  Monitor. 4.  Leukocytosis: Likely reactive, monitor.  Patient expressed understanding and was in agreement with this plan. She also understands that She can call clinic at any time with any questions, concerns, or complaints.   Cancer Staging Colon cancer [facility]) Staging form: Colon and Rectum, AJCC 8th Edition - Clinical stage from 11/13/2019: Stage IIIB (cT3, cN1a, cM0) - Signed by Lloyd Huger, MD on 11/13/2019   Lloyd Huger, MD   12/07/2019 10:51 AM

## 2019-12-07 ENCOUNTER — Other Ambulatory Visit: Payer: Self-pay

## 2019-12-07 ENCOUNTER — Inpatient Hospital Stay (HOSPITAL_BASED_OUTPATIENT_CLINIC_OR_DEPARTMENT_OTHER): Payer: Medicare Other | Admitting: Oncology

## 2019-12-07 ENCOUNTER — Encounter: Payer: Self-pay | Admitting: Oncology

## 2019-12-07 ENCOUNTER — Inpatient Hospital Stay: Payer: Medicare Other

## 2019-12-07 VITALS — BP 104/73 | HR 89 | Temp 96.5°F | Resp 20 | Wt 165.4 lb

## 2019-12-07 DIAGNOSIS — C182 Malignant neoplasm of ascending colon: Secondary | ICD-10-CM

## 2019-12-07 DIAGNOSIS — Z5111 Encounter for antineoplastic chemotherapy: Secondary | ICD-10-CM | POA: Diagnosis not present

## 2019-12-07 LAB — CBC WITH DIFFERENTIAL/PLATELET
Abs Immature Granulocytes: 0.07 10*3/uL (ref 0.00–0.07)
Basophils Absolute: 0 10*3/uL (ref 0.0–0.1)
Basophils Relative: 0 %
Eosinophils Absolute: 0.2 10*3/uL (ref 0.0–0.5)
Eosinophils Relative: 1 %
HCT: 33.7 % — ABNORMAL LOW (ref 36.0–46.0)
Hemoglobin: 11.6 g/dL — ABNORMAL LOW (ref 12.0–15.0)
Immature Granulocytes: 1 %
Lymphocytes Relative: 16 %
Lymphs Abs: 1.8 10*3/uL (ref 0.7–4.0)
MCH: 30.6 pg (ref 26.0–34.0)
MCHC: 34.4 g/dL (ref 30.0–36.0)
MCV: 88.9 fL (ref 80.0–100.0)
Monocytes Absolute: 1 10*3/uL (ref 0.1–1.0)
Monocytes Relative: 8 %
Neutro Abs: 8.5 10*3/uL — ABNORMAL HIGH (ref 1.7–7.7)
Neutrophils Relative %: 74 %
Platelets: 297 10*3/uL (ref 150–400)
RBC: 3.79 MIL/uL — ABNORMAL LOW (ref 3.87–5.11)
RDW: 13.5 % (ref 11.5–15.5)
WBC: 11.6 10*3/uL — ABNORMAL HIGH (ref 4.0–10.5)
nRBC: 0 % (ref 0.0–0.2)

## 2019-12-07 LAB — COMPREHENSIVE METABOLIC PANEL
ALT: 26 U/L (ref 0–44)
AST: 26 U/L (ref 15–41)
Albumin: 3.5 g/dL (ref 3.5–5.0)
Alkaline Phosphatase: 88 U/L (ref 38–126)
Anion gap: 10 (ref 5–15)
BUN: 8 mg/dL (ref 8–23)
CO2: 24 mmol/L (ref 22–32)
Calcium: 8.9 mg/dL (ref 8.9–10.3)
Chloride: 103 mmol/L (ref 98–111)
Creatinine, Ser: 0.37 mg/dL — ABNORMAL LOW (ref 0.44–1.00)
GFR calc Af Amer: >60 mL/min (ref >60)
GFR calc non Af Amer: >60 mL/min (ref >60)
Glucose, Bld: 113 mg/dL — ABNORMAL HIGH (ref 70–99)
Potassium: 3.6 mmol/L (ref 3.5–5.1)
Sodium: 137 mmol/L (ref 135–145)
Total Bilirubin: 0.9 mg/dL (ref 0.3–1.2)
Total Protein: 6.8 g/dL (ref 6.5–8.1)

## 2019-12-07 MED ORDER — SODIUM CHLORIDE 0.9% FLUSH
10.0000 mL | Freq: Once | INTRAVENOUS | Status: AC
  Administered 2019-12-07: 10 mL via INTRAVENOUS
  Filled 2019-12-07: qty 10

## 2019-12-07 MED ORDER — HEPARIN SOD (PORK) LOCK FLUSH 100 UNIT/ML IV SOLN
INTRAVENOUS | Status: AC
  Filled 2019-12-07: qty 5

## 2019-12-07 MED ORDER — HEPARIN SOD (PORK) LOCK FLUSH 100 UNIT/ML IV SOLN
500.0000 [IU] | Freq: Once | INTRAVENOUS | Status: AC
  Administered 2019-12-07: 500 [IU] via INTRAVENOUS
  Filled 2019-12-07: qty 5

## 2019-12-08 ENCOUNTER — Telehealth: Payer: Self-pay | Admitting: *Deleted

## 2019-12-08 NOTE — Telephone Encounter (Signed)
She states that she felt the need to have a BM yesterday, but only has pain with coughing, sneezing, or certain movements. She reports that her bowel movements are soft. No diarrhea. No chills or fever. She reports that the pain is just soreness and tenderness. This started on Sunday. She reports that is not as bad as yesterday.  She will come in to see him this Thursday for a follow up.

## 2019-12-08 NOTE — Telephone Encounter (Signed)
Patient called and stated that she had surgery on 10/28/19 Partial right Colectomy, and the past couple of days she has been expericening some mild pain when she coughs, sneeze or move. She called and spoke with the triage nurse at the cancer center and they advised her to call here.  Patient has not had any vomiting or fever but had 1 chemo treatment with a little nausea.   Please call patient and advise

## 2019-12-10 ENCOUNTER — Encounter: Payer: Medicare Other | Admitting: Surgery

## 2019-12-10 NOTE — Progress Notes (Signed)
[place]  [phone] [phone]  ID: Maria Davies OB: 08-Apr-1941  MR#: [id number]  UDJ#:[id number]  Patient Care Team: Valerie Roys, DO as PCP - General (Family Medicine) Guadalupe Maple, MD as PCP - Family Medicine (Family Medicine) Ronny Bacon, MD as Consulting Physician (General Surgery) Lloyd Huger, MD as Consulting Physician (Oncology)  CHIEF COMPLAINT: Stage IIIb right colon cancer.  INTERVAL HISTORY: Patient returns to clinic today for further evaluation and consideration of cycle 2 of adjuvant FOLFOX. She currently feels well and is asymptomatic. She has no neurologic complaints.  She denies any recent fevers or illnesses.  She has a good appetite and denies weight loss.  She has no chest pain, shortness of breath, cough, or hemoptysis. She denies any nausea, vomiting, constipation, or diarrhea.  She has no melena or hematochezia.  She has no urinary complaints. Patient offers no specific complaints today.  REVIEW OF SYSTEMS:   Review of Systems  Constitutional: Negative.  Negative for fever, malaise/fatigue and weight loss.  Respiratory: Negative.  Negative for cough, hemoptysis and shortness of breath.   Cardiovascular: Negative.  Negative for chest pain and leg swelling.  Gastrointestinal: Negative.  Negative for abdominal pain, blood in stool and melena.  Genitourinary: Negative.  Negative for hematuria.  Musculoskeletal: Negative.  Negative for back pain.  Skin: Negative.  Negative for rash.  Neurological: Negative.  Negative for dizziness, focal weakness, weakness and headaches.  Psychiatric/Behavioral: Negative.  The patient is not nervous/anxious.     As per HPI. Otherwise, a complete review of systems is negative.  PAST MEDICAL HISTORY: Past Medical History:  Diagnosis Date  . Anemia   . Arthritis   . Basal cell carcinoma    back  . Breast cancer ([place]) 2010   RT LUMPECTOMY  . Cancer ([place])    rt  breast  . Chemotherapy induced nausea and vomiting   . Heart murmur   . Hypertension   . Hypothyroidism    currently not on meds  . Osteopenia   . Personal history of chemotherapy 2010   right breast ca  . Personal history of radiation therapy 2010   right breast ca  . Port-A-Cath in place     PAST SURGICAL HISTORY: Past Surgical History:  Procedure Laterality Date  . BACK SURGERY    . BREAST EXCISIONAL BIOPSY Right 03/2009   radiation and chemo +  . BREAST EXCISIONAL BIOPSY Bilateral    NEG  . BREAST SURGERY    . CATARACT EXTRACTION W/ INTRAOCULAR LENS  IMPLANT, BILATERAL    . CHOLECYSTECTOMY    . COLONOSCOPY WITH PROPOFOL N/A 10/19/2019   Procedure: COLONOSCOPY WITH PROPOFOL-attempted, unable to perform poor prep;  Surgeon: Lucilla Lame, MD;  Location: [place];  Service: Endoscopy;  Laterality: N/A;  priority 3  . COLONOSCOPY WITH PROPOFOL N/A 10/20/2019   Procedure: COLONOSCOPY WITH PROPOFOL;  Surgeon: Lucilla Lame, MD;  Location: [facility] ENDOSCOPY;  Service: Endoscopy;  Laterality: N/A;  . ESOPHAGEAL DILATION  10/19/2019   Procedure: ESOPHAGEAL DILATION;  Surgeon: Lucilla Lame, MD;  Location: [place];  Service: Endoscopy;;  . ESOPHAGOGASTRODUODENOSCOPY (EGD) WITH PROPOFOL N/A 10/19/2019   Procedure: ESOPHAGOGASTRODUODENOSCOPY (EGD) WITH PROPOFOL;  Surgeon: Lucilla Lame, MD;  Location: [place];  Service: Endoscopy;  Laterality: N/A;  . PORTACATH PLACEMENT Right 11/23/2019   Procedure: INSERTION PORT-A-CATH;  Surgeon: Ronny Bacon, MD;  Location: [facility];  Service: General;  Laterality: Right;    FAMILY HISTORY: Family History  Problem Relation Age of Onset  . Cancer Mother   . Heart disease Father   . Breast cancer Paternal Aunt 24    ADVANCED DIRECTIVES (Y/N):  N  HEALTH MAINTENANCE: Social History   Tobacco Use  . Smoking status: Never Smoker  . Smokeless tobacco: Never Used  Vaping Use  . Vaping Use: Never used  Substance Use  Topics  . Alcohol use: Not Currently    Alcohol/week: 0.0 standard drinks    Comment: No alcohol since 08/05/19  . Drug use: No     Colonoscopy:  PAP:  Bone density:  Lipid panel:  No Known Allergies  Current Outpatient Medications  Medication Sig Dispense Refill  . acetaminophen (TYLENOL) 325 MG tablet Take 325 mg by mouth 3 (three) times daily as needed for moderate pain or headache.    Marland Kitchen amLODipine (NORVASC) 5 MG tablet Take 1 tablet (5 mg total) by mouth daily. 90 tablet 1  . aspirin EC 81 MG tablet Take 81 mg by mouth daily.    . benazepril (LOTENSIN) 40 MG tablet Take 1 tablet (40 mg total) by mouth daily. 90 tablet 1  . Cholecalciferol (VITAMIN D3) 50 MCG (2000 UT) capsule Take 4,000 Units by mouth daily.     Marland Kitchen lidocaine-prilocaine (EMLA) cream Apply to affected area once (Patient taking differently: Apply 1 application topically daily as needed (port access). ) 30 g 3  . methimazole (TAPAZOLE) 5 MG tablet Take 5 mg by mouth daily.     . metoprolol succinate (TOPROL-XL) 50 MG 24 hr tablet Take 1 tablet (50 mg total) by mouth daily. 90 tablet 1  . Multiple Vitamin (MULTI-VITAMINS) TABS Take 1 tablet by mouth daily.     . ondansetron (ZOFRAN) 8 MG tablet Take 1 tablet (8 mg total) by mouth 2 (two) times daily as needed for refractory nausea / vomiting. 60 tablet 2  . OVER THE COUNTER MEDICATION Take 4 capsules by mouth in the morning and at bedtime. Omega 3, resveratrol, icariin, curcumin  (Relief Factor)    . Phenylephrine HCl (NEO-SYNEPHRINE NA) Place 1 spray into the nose daily as needed (congestion).    . Probiotic Product (PROBIOTIC ADVANCED PO) Take 1-2 capsules by mouth daily. When taking antibiotics pt will take 2 capsules instead of 1, normally just takes 1 per day otherwise    . prochlorperazine (COMPAZINE) 10 MG tablet Take 1 tablet (10 mg total) by mouth every 6 (six) hours as needed (Nausea or vomiting). 60 tablet 2  . silver sulfADIAZINE (SILVADENE) 1 % cream Apply 1  application topically daily. (Patient taking differently: Apply 1 application topically daily as needed (burn). ) 50 g 0  . Sodium Hyaluronate, oral, (HYALURONIC ACID PO) Take 1 tablet by mouth daily.     No current facility-administered medications for this visit.   Facility-Administered Medications Ordered in Other Visits  Medication Dose Route Frequency Provider Last Rate Last Admin  . dexamethasone (DECADRON) 10 mg in sodium chloride 0.9 % 50 mL IVPB  10 mg Intravenous Once Lloyd Huger, MD      . dextrose 5 % solution   Intravenous Once Lloyd Huger, MD      . fluorouracil (ADRUCIL) 4,450 mg in sodium chloride 0.9 % 61 mL chemo infusion  2,400 mg/m2 (Treatment Plan Recorded) Intravenous 1 day or 1 dose Lloyd Huger, MD      . fluorouracil (ADRUCIL) chemo injection 750 mg  400 mg/m2 (Treatment Plan Recorded) Intravenous Once Lloyd Huger, MD      .  heparin lock flush 100 unit/mL  500 Units Intravenous Once Lloyd Huger, MD      . leucovorin 750 mg in dextrose 5 % 250 mL infusion  750 mg Intravenous Once Lloyd Huger, MD      . oxaliplatin (ELOXATIN) 150 mg in dextrose 5 % 500 mL chemo infusion  150 mg Intravenous Once Lloyd Huger, MD      . palonosetron (ALOXI) injection 0.25 mg  0.25 mg Intravenous Once Lloyd Huger, MD      . sodium chloride flush (NS) 0.9 % injection 10 mL  10 mL Intravenous Once Lloyd Huger, MD        OBJECTIVE: Vitals:   12/14/19 0846  BP: (!) 117/55  Pulse: 97  Resp: 20  Temp: 97.7 F (36.5 C)  SpO2: 100%     Body mass index is 28.84 kg/m.    ECOG FS:0 - Asymptomatic  General: Well-developed, well-nourished, no acute distress. Eyes: Pink conjunctiva, anicteric sclera. HEENT: Normocephalic, moist mucous membranes. Lungs: No audible wheezing or coughing. Heart: Regular rate and rhythm. Abdomen: Soft, nontender, no obvious distention. Musculoskeletal: No edema, cyanosis, or clubbing. Neuro:  Alert, answering all questions appropriately. Cranial nerves grossly intact. Skin: No rashes or petechiae noted. Psych: Normal affect.   LAB RESULTS:  Lab Results  Component Value Date   NA 138 12/14/2019   K 3.1 (L) 12/14/2019   CL 100 12/14/2019   CO2 26 12/14/2019   GLUCOSE 110 (H) 12/14/2019   BUN 9 12/14/2019   CREATININE 0.45 12/14/2019   CALCIUM 8.9 12/14/2019   PROT 6.5 12/14/2019   ALBUMIN 2.8 (L) 12/14/2019   AST 25 12/14/2019   ALT 37 12/14/2019   ALKPHOS 116 12/14/2019   BILITOT 0.7 12/14/2019   GFRNONAA >60 12/14/2019   GFRAA >60 12/14/2019    Lab Results  Component Value Date   WBC 7.9 12/14/2019   NEUTROABS 4.8 12/14/2019   HGB 10.2 (L) 12/14/2019   HCT 29.3 (L) 12/14/2019   MCV 86.4 12/14/2019   PLT 344 12/14/2019     STUDIES: DG Chest Port 1 View  Result Date: 11/23/2019 CLINICAL DATA:  Port-A-Cath placement EXAM: PORTABLE CHEST 1 VIEW COMPARISON:  10/28/2019 FINDINGS: Right Port-A-Cath has been placed with the tip at the cavoatrial junction. No pneumothorax. Heart is normal size. No confluent opacities or effusions. IMPRESSION: Right Port-A-Cath placement with the tip at the cavoatrial junction. No pneumothorax. Electronically Signed   By: Rolm Baptise M.D.   On: 11/23/2019 08:54   DG C-Arm 1-60 Min-No Report  Result Date: 11/23/2019 Fluoroscopy was utilized by the requesting physician.  No radiographic interpretation.    ASSESSMENT: Stage IIIb right colon cancer  PLAN:    1. Stage IIIb right colon cancer: Pathology results reviewed, confirming stage of disease.  CT scan results from Oct 28, 2019 reviewed independently  with no obvious evidence of metastatic disease.  Patient has now had port placement. Proceed with cycle 2 of 12 of adjuvant FOLFOX today. Return to clinic in 2 days for pump removal and then in 2 weeks for further evaluation and consideration of cycle 3 2.  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  She completed 5 years of anastrozole in June 2016.  Her most recent mammogram on February 02, 2019 was reported as BI-RADS 1.  Repeat in August 2021. 3.  Anemia: Hemoglobin has trended down to 10.2, monitor. 4.  Leukocytosis: Resolved. 5. Hypokalemia: Patient was given dietary changes today.  Patient expressed understanding and was in agreement with this plan. She also understands that She can call clinic at any time with any questions, concerns, or complaints.   Cancer Staging Colon cancer [facility]) Staging form: Colon and Rectum, AJCC 8th Edition - Clinical stage from 11/13/2019: Stage IIIB (cT3, cN1a, cM0) - Signed by Lloyd Huger, MD on 11/13/2019   Lloyd Huger, MD   12/14/2019 9:20 AM

## 2019-12-11 ENCOUNTER — Encounter: Payer: Self-pay | Admitting: Oncology

## 2019-12-11 NOTE — Progress Notes (Signed)
Called patient for pre assessment. She states she is still having some lower abdominal pain that comes and goes. She has called her gastroenterologist and they believe it may be coming from chemo treatment. She however thinks it may be just "gas". She denied any other pain or concerns at this time.

## 2019-12-14 ENCOUNTER — Inpatient Hospital Stay: Payer: Medicare Other

## 2019-12-14 ENCOUNTER — Inpatient Hospital Stay (HOSPITAL_BASED_OUTPATIENT_CLINIC_OR_DEPARTMENT_OTHER): Payer: Medicare Other | Admitting: Oncology

## 2019-12-14 ENCOUNTER — Encounter: Payer: Self-pay | Admitting: Oncology

## 2019-12-14 ENCOUNTER — Other Ambulatory Visit: Payer: Self-pay

## 2019-12-14 VITALS — BP 122/73 | HR 98 | Resp 16

## 2019-12-14 VITALS — BP 117/55 | HR 97 | Temp 97.7°F | Resp 20 | Ht 63.0 in | Wt 162.8 lb

## 2019-12-14 DIAGNOSIS — C182 Malignant neoplasm of ascending colon: Secondary | ICD-10-CM | POA: Diagnosis not present

## 2019-12-14 DIAGNOSIS — Z5111 Encounter for antineoplastic chemotherapy: Secondary | ICD-10-CM | POA: Diagnosis not present

## 2019-12-14 LAB — CBC WITH DIFFERENTIAL/PLATELET
Abs Immature Granulocytes: 0.07 10*3/uL (ref 0.00–0.07)
Basophils Absolute: 0.1 10*3/uL (ref 0.0–0.1)
Basophils Relative: 1 %
Eosinophils Absolute: 0.2 10*3/uL (ref 0.0–0.5)
Eosinophils Relative: 2 %
HCT: 29.3 % — ABNORMAL LOW (ref 36.0–46.0)
Hemoglobin: 10.2 g/dL — ABNORMAL LOW (ref 12.0–15.0)
Immature Granulocytes: 1 %
Lymphocytes Relative: 22 %
Lymphs Abs: 1.7 10*3/uL (ref 0.7–4.0)
MCH: 30.1 pg (ref 26.0–34.0)
MCHC: 34.8 g/dL (ref 30.0–36.0)
MCV: 86.4 fL (ref 80.0–100.0)
Monocytes Absolute: 1.1 10*3/uL — ABNORMAL HIGH (ref 0.1–1.0)
Monocytes Relative: 14 %
Neutro Abs: 4.8 10*3/uL (ref 1.7–7.7)
Neutrophils Relative %: 60 %
Platelets: 344 10*3/uL (ref 150–400)
RBC: 3.39 MIL/uL — ABNORMAL LOW (ref 3.87–5.11)
RDW: 13.6 % (ref 11.5–15.5)
WBC: 7.9 10*3/uL (ref 4.0–10.5)
nRBC: 0 % (ref 0.0–0.2)

## 2019-12-14 LAB — COMPREHENSIVE METABOLIC PANEL
ALT: 37 U/L (ref 0–44)
AST: 25 U/L (ref 15–41)
Albumin: 2.8 g/dL — ABNORMAL LOW (ref 3.5–5.0)
Alkaline Phosphatase: 116 U/L (ref 38–126)
Anion gap: 12 (ref 5–15)
BUN: 9 mg/dL (ref 8–23)
CO2: 26 mmol/L (ref 22–32)
Calcium: 8.9 mg/dL (ref 8.9–10.3)
Chloride: 100 mmol/L (ref 98–111)
Creatinine, Ser: 0.45 mg/dL (ref 0.44–1.00)
GFR calc Af Amer: >60 mL/min (ref >60)
GFR calc non Af Amer: >60 mL/min (ref >60)
Glucose, Bld: 110 mg/dL — ABNORMAL HIGH (ref 70–99)
Potassium: 3.1 mmol/L — ABNORMAL LOW (ref 3.5–5.1)
Sodium: 138 mmol/L (ref 135–145)
Total Bilirubin: 0.7 mg/dL (ref 0.3–1.2)
Total Protein: 6.5 g/dL (ref 6.5–8.1)

## 2019-12-14 MED ORDER — SODIUM CHLORIDE 0.9 % IV SOLN
10.0000 mg | Freq: Once | INTRAVENOUS | Status: AC
  Administered 2019-12-14: 10 mg via INTRAVENOUS
  Filled 2019-12-14: qty 10

## 2019-12-14 MED ORDER — SODIUM CHLORIDE 0.9% FLUSH
10.0000 mL | Freq: Once | INTRAVENOUS | Status: DC
  Filled 2019-12-14: qty 10

## 2019-12-14 MED ORDER — PALONOSETRON HCL INJECTION 0.25 MG/5ML
0.2500 mg | Freq: Once | INTRAVENOUS | Status: AC
  Administered 2019-12-14: 0.25 mg via INTRAVENOUS
  Filled 2019-12-14: qty 5

## 2019-12-14 MED ORDER — DEXTROSE 5 % IV SOLN
Freq: Once | INTRAVENOUS | Status: AC
  Filled 2019-12-14: qty 250

## 2019-12-14 MED ORDER — OXALIPLATIN CHEMO INJECTION 100 MG/20ML
150.0000 mg | Freq: Once | INTRAVENOUS | Status: AC
  Administered 2019-12-14: 150 mg via INTRAVENOUS
  Filled 2019-12-14: qty 20

## 2019-12-14 MED ORDER — FLUOROURACIL CHEMO INJECTION 2.5 GM/50ML
400.0000 mg/m2 | Freq: Once | INTRAVENOUS | Status: AC
  Administered 2019-12-14: 750 mg via INTRAVENOUS
  Filled 2019-12-14: qty 15

## 2019-12-14 MED ORDER — HEPARIN SOD (PORK) LOCK FLUSH 100 UNIT/ML IV SOLN
500.0000 [IU] | Freq: Once | INTRAVENOUS | Status: DC
  Filled 2019-12-14: qty 5

## 2019-12-14 MED ORDER — LEUCOVORIN CALCIUM INJECTION 350 MG
750.0000 mg | Freq: Once | INTRAVENOUS | Status: AC
  Administered 2019-12-14: 750 mg via INTRAVENOUS
  Filled 2019-12-14: qty 25

## 2019-12-14 MED ORDER — SODIUM CHLORIDE 0.9 % IV SOLN
2400.0000 mg/m2 | INTRAVENOUS | Status: DC
  Administered 2019-12-14: 4450 mg via INTRAVENOUS
  Filled 2019-12-14: qty 89

## 2019-12-16 ENCOUNTER — Inpatient Hospital Stay: Payer: Medicare Other

## 2019-12-16 ENCOUNTER — Other Ambulatory Visit: Payer: Self-pay

## 2019-12-16 VITALS — BP 128/75 | HR 94 | Temp 98.2°F | Resp 18

## 2019-12-16 DIAGNOSIS — Z5111 Encounter for antineoplastic chemotherapy: Secondary | ICD-10-CM | POA: Diagnosis not present

## 2019-12-16 DIAGNOSIS — C182 Malignant neoplasm of ascending colon: Secondary | ICD-10-CM

## 2019-12-16 MED ORDER — HEPARIN SOD (PORK) LOCK FLUSH 100 UNIT/ML IV SOLN
INTRAVENOUS | Status: AC
  Filled 2019-12-16: qty 5

## 2019-12-16 MED ORDER — SODIUM CHLORIDE 0.9% FLUSH
10.0000 mL | INTRAVENOUS | Status: DC | PRN
  Administered 2019-12-16: 10 mL
  Filled 2019-12-16: qty 10

## 2019-12-16 MED ORDER — HEPARIN SOD (PORK) LOCK FLUSH 100 UNIT/ML IV SOLN
500.0000 [IU] | Freq: Once | INTRAVENOUS | Status: AC | PRN
  Administered 2019-12-16: 500 [IU]
  Filled 2019-12-16: qty 5

## 2019-12-28 ENCOUNTER — Ambulatory Visit: Payer: Medicare Other

## 2019-12-28 ENCOUNTER — Other Ambulatory Visit: Payer: Self-pay

## 2019-12-28 ENCOUNTER — Encounter: Payer: Self-pay | Admitting: Oncology

## 2019-12-28 ENCOUNTER — Inpatient Hospital Stay: Payer: Medicare Other

## 2019-12-28 ENCOUNTER — Inpatient Hospital Stay (HOSPITAL_BASED_OUTPATIENT_CLINIC_OR_DEPARTMENT_OTHER): Payer: Medicare Other | Admitting: Oncology

## 2019-12-28 ENCOUNTER — Other Ambulatory Visit: Payer: Medicare Other

## 2019-12-28 ENCOUNTER — Inpatient Hospital Stay: Payer: Medicare Other | Attending: Oncology

## 2019-12-28 VITALS — BP 106/54 | HR 95 | Temp 98.0°F | Resp 18 | Wt 156.4 lb

## 2019-12-28 DIAGNOSIS — M129 Arthropathy, unspecified: Secondary | ICD-10-CM | POA: Diagnosis not present

## 2019-12-28 DIAGNOSIS — Z85828 Personal history of other malignant neoplasm of skin: Secondary | ICD-10-CM | POA: Insufficient documentation

## 2019-12-28 DIAGNOSIS — E039 Hypothyroidism, unspecified: Secondary | ICD-10-CM | POA: Insufficient documentation

## 2019-12-28 DIAGNOSIS — C182 Malignant neoplasm of ascending colon: Secondary | ICD-10-CM | POA: Diagnosis not present

## 2019-12-28 DIAGNOSIS — Z923 Personal history of irradiation: Secondary | ICD-10-CM | POA: Insufficient documentation

## 2019-12-28 DIAGNOSIS — R945 Abnormal results of liver function studies: Secondary | ICD-10-CM

## 2019-12-28 DIAGNOSIS — Z7982 Long term (current) use of aspirin: Secondary | ICD-10-CM | POA: Insufficient documentation

## 2019-12-28 DIAGNOSIS — R7989 Other specified abnormal findings of blood chemistry: Secondary | ICD-10-CM

## 2019-12-28 DIAGNOSIS — M8580 Other specified disorders of bone density and structure, unspecified site: Secondary | ICD-10-CM | POA: Insufficient documentation

## 2019-12-28 DIAGNOSIS — R011 Cardiac murmur, unspecified: Secondary | ICD-10-CM | POA: Diagnosis not present

## 2019-12-28 DIAGNOSIS — Z9221 Personal history of antineoplastic chemotherapy: Secondary | ICD-10-CM | POA: Insufficient documentation

## 2019-12-28 DIAGNOSIS — Z5111 Encounter for antineoplastic chemotherapy: Secondary | ICD-10-CM

## 2019-12-28 DIAGNOSIS — Z79899 Other long term (current) drug therapy: Secondary | ICD-10-CM | POA: Diagnosis not present

## 2019-12-28 DIAGNOSIS — D649 Anemia, unspecified: Secondary | ICD-10-CM | POA: Insufficient documentation

## 2019-12-28 DIAGNOSIS — I10 Essential (primary) hypertension: Secondary | ICD-10-CM | POA: Insufficient documentation

## 2019-12-28 LAB — CBC WITH DIFFERENTIAL/PLATELET
Abs Immature Granulocytes: 0.06 10*3/uL (ref 0.00–0.07)
Basophils Absolute: 0.1 10*3/uL (ref 0.0–0.1)
Basophils Relative: 1 %
Eosinophils Absolute: 0.1 10*3/uL (ref 0.0–0.5)
Eosinophils Relative: 1 %
HCT: 30.4 % — ABNORMAL LOW (ref 36.0–46.0)
Hemoglobin: 10.6 g/dL — ABNORMAL LOW (ref 12.0–15.0)
Immature Granulocytes: 1 %
Lymphocytes Relative: 15 %
Lymphs Abs: 1.2 10*3/uL (ref 0.7–4.0)
MCH: 29.7 pg (ref 26.0–34.0)
MCHC: 34.9 g/dL (ref 30.0–36.0)
MCV: 85.2 fL (ref 80.0–100.0)
Monocytes Absolute: 0.9 10*3/uL (ref 0.1–1.0)
Monocytes Relative: 12 %
Neutro Abs: 5.7 10*3/uL (ref 1.7–7.7)
Neutrophils Relative %: 70 %
Platelets: 242 10*3/uL (ref 150–400)
RBC: 3.57 MIL/uL — ABNORMAL LOW (ref 3.87–5.11)
RDW: 13.5 % (ref 11.5–15.5)
WBC: 8 10*3/uL (ref 4.0–10.5)
nRBC: 0 % (ref 0.0–0.2)

## 2019-12-28 LAB — COMPREHENSIVE METABOLIC PANEL
ALT: 42 U/L (ref 0–44)
AST: 46 U/L — ABNORMAL HIGH (ref 15–41)
Albumin: 3 g/dL — ABNORMAL LOW (ref 3.5–5.0)
Alkaline Phosphatase: 115 U/L (ref 38–126)
Anion gap: 10 (ref 5–15)
BUN: 9 mg/dL (ref 8–23)
CO2: 24 mmol/L (ref 22–32)
Calcium: 8.7 mg/dL — ABNORMAL LOW (ref 8.9–10.3)
Chloride: 104 mmol/L (ref 98–111)
Creatinine, Ser: 0.47 mg/dL (ref 0.44–1.00)
GFR calc Af Amer: >60 mL/min (ref >60)
GFR calc non Af Amer: >60 mL/min (ref >60)
Glucose, Bld: 130 mg/dL — ABNORMAL HIGH (ref 70–99)
Potassium: 3.6 mmol/L (ref 3.5–5.1)
Sodium: 138 mmol/L (ref 135–145)
Total Bilirubin: 0.8 mg/dL (ref 0.3–1.2)
Total Protein: 6.5 g/dL (ref 6.5–8.1)

## 2019-12-28 MED ORDER — LEUCOVORIN CALCIUM INJECTION 350 MG
750.0000 mg | Freq: Once | INTRAVENOUS | Status: AC
  Administered 2019-12-28: 750 mg via INTRAVENOUS
  Filled 2019-12-28: qty 25

## 2019-12-28 MED ORDER — DEXTROSE 5 % IV SOLN
Freq: Once | INTRAVENOUS | Status: AC
  Filled 2019-12-28: qty 250

## 2019-12-28 MED ORDER — SODIUM CHLORIDE 0.9 % IV SOLN
2400.0000 mg/m2 | INTRAVENOUS | Status: DC
  Administered 2019-12-28: 4450 mg via INTRAVENOUS
  Filled 2019-12-28: qty 89

## 2019-12-28 MED ORDER — FLUOROURACIL CHEMO INJECTION 2.5 GM/50ML
400.0000 mg/m2 | Freq: Once | INTRAVENOUS | Status: AC
  Administered 2019-12-28: 750 mg via INTRAVENOUS
  Filled 2019-12-28: qty 15

## 2019-12-28 MED ORDER — SODIUM CHLORIDE 0.9% FLUSH
10.0000 mL | Freq: Once | INTRAVENOUS | Status: AC
  Administered 2019-12-28: 10 mL via INTRAVENOUS
  Filled 2019-12-28: qty 10

## 2019-12-28 MED ORDER — PALONOSETRON HCL INJECTION 0.25 MG/5ML
0.2500 mg | Freq: Once | INTRAVENOUS | Status: AC
  Administered 2019-12-28: 0.25 mg via INTRAVENOUS
  Filled 2019-12-28: qty 5

## 2019-12-28 MED ORDER — OXALIPLATIN CHEMO INJECTION 100 MG/20ML
150.0000 mg | Freq: Once | INTRAVENOUS | Status: AC
  Administered 2019-12-28: 150 mg via INTRAVENOUS
  Filled 2019-12-28: qty 20

## 2019-12-28 MED ORDER — HEPARIN SOD (PORK) LOCK FLUSH 100 UNIT/ML IV SOLN
500.0000 [IU] | Freq: Once | INTRAVENOUS | Status: DC
  Filled 2019-12-28: qty 5

## 2019-12-28 MED ORDER — SODIUM CHLORIDE 0.9 % IV SOLN
10.0000 mg | Freq: Once | INTRAVENOUS | Status: AC
  Administered 2019-12-28: 10 mg via INTRAVENOUS
  Filled 2019-12-28: qty 10

## 2019-12-28 NOTE — Progress Notes (Signed)
Hematology/Oncology Consult note [facility]  [phone] [phone]  Patient Care Team: Valerie Roys, DO as PCP - General (Family Medicine) Guadalupe Maple, MD as PCP - Family Medicine (Family Medicine) Ronny Bacon, MD as Consulting Physician (General Surgery) Lloyd Huger, MD as Consulting Physician (Oncology)   Name of the patient: Maria Davies  [id number]  02-07-1941   Date of visit: 12/28/19  Diagnosis-stage IIIb right colon cancer  Chief complaint/ Reason for visit-on treatment assessment prior to cycle 3 of adjuvant FOLFOX chemotherapy  Heme/Onc history: Patient is a 79 year old female who sees Dr. Grayland Ormond as an outpatient.  She has a history of stage I right breast cancer back in 2011 s/p surgery, radiation treatment and 5 years of anastrozole.  She was recently diagnosed with T3 N1 M0 stage IIIb right colon cancer s/p hemicolectomy and is receiving adjuvant FOLFOX chemotherapy  Interval history-patient reports doing well and denies any complaints at this time.  She occasionally has sensitivity to cold when she drinks cold water but denies any tingling numbness in her extremities.  Denies any nausea or vomiting  ECOG PS- 1 Pain scale- 0   Review of systems- Review of Systems  Constitutional: Negative for chills, fever, malaise/fatigue and weight loss.  HENT: Negative for congestion, ear discharge and nosebleeds.   Eyes: Negative for blurred vision.  Respiratory: Negative for cough, hemoptysis, sputum production, shortness of breath and wheezing.   Cardiovascular: Negative for chest pain, palpitations, orthopnea and claudication.  Gastrointestinal: Negative for abdominal pain, blood in stool, constipation, diarrhea, heartburn, melena, nausea and vomiting.  Genitourinary: Negative for dysuria, flank pain, frequency, hematuria and urgency.  Musculoskeletal: Negative for back pain, joint pain and myalgias.    Skin: Negative for rash.  Neurological: Negative for dizziness, tingling, focal weakness, seizures, weakness and headaches.  Endo/Heme/Allergies: Does not bruise/bleed easily.  Psychiatric/Behavioral: Negative for depression and suicidal ideas. The patient does not have insomnia.      No Known Allergies   Past Medical History:  Diagnosis Date  . Anemia   . Arthritis   . Basal cell carcinoma    back  . Breast cancer ([place]) 2010   RT LUMPECTOMY  . Cancer ([place])    rt breast  . Chemotherapy induced nausea and vomiting   . Heart murmur   . Hypertension   . Hypothyroidism    currently not on meds  . Osteopenia   . Personal history of chemotherapy 2010   right breast ca  . Personal history of radiation therapy 2010   right breast ca  . Port-A-Cath in place      Past Surgical History:  Procedure Laterality Date  . BACK SURGERY    . BREAST EXCISIONAL BIOPSY Right 03/2009   radiation and chemo +  . BREAST EXCISIONAL BIOPSY Bilateral    NEG  . BREAST SURGERY    . CATARACT EXTRACTION W/ INTRAOCULAR LENS  IMPLANT, BILATERAL    . CHOLECYSTECTOMY    . COLONOSCOPY WITH PROPOFOL N/A 10/19/2019   Procedure: COLONOSCOPY WITH PROPOFOL-attempted, unable to perform poor prep;  Surgeon: Lucilla Lame, MD;  Location: [place];  Service: Endoscopy;  Laterality: N/A;  priority 3  . COLONOSCOPY WITH PROPOFOL N/A 10/20/2019   Procedure: COLONOSCOPY WITH PROPOFOL;  Surgeon: Lucilla Lame, MD;  Location: [facility] END[facility]COPY;  Service: Endoscopy;  Laterality: N/A;  . ESOPHAGEAL DILATION  10/19/2019   Procedure: ESOPHAGEAL DILATION;  Surgeon: Lucilla Lame, MD;  Location: [place];  Service: Endoscopy;;  . ESOPHAGOGASTRODUODENOSCOPY (EGD) WITH PROPOFOL N/A 10/19/2019   Procedure: ESOPHAGOGASTRODUODENOSCOPY (EGD) WITH PROPOFOL;  Surgeon: Lucilla Lame, MD;  Location: [place];  Service: Endoscopy;  Laterality: N/A;  . PORTACATH PLACEMENT Right 11/23/2019   Procedure: INSERTION  PORT-A-CATH;  Surgeon: Ronny Bacon, MD;  Location: [facility];  Service: General;  Laterality: Right;    Social History   Socioeconomic History  . Marital status: Widowed    Spouse name: Not on file  . Number of children: Not on file  . Years of education: Not on file  . Highest education level: Not on file  Occupational History  . Occupation: retired   Tobacco Use  . Smoking status: Never Smoker  . Smokeless tobacco: Never Used  Vaping Use  . Vaping Use: Never used  Substance and Sexual Activity  . Alcohol use: Not Currently    Alcohol/week: 0.0 standard drinks    Comment: No alcohol since 08/05/19  . Drug use: No  . Sexual activity: Not on file  Other Topics Concern  . Not on file  Social History Narrative  . Not on file   Social Determinants of Health   Financial Resource Strain:   . Difficulty of Paying Living Expenses:   Food Insecurity:   . Worried About Charity fundraiser in the Last Year:   . Arboriculturist in the Last Year:   Transportation Needs:   . Film/video editor (Medical):   Marland Kitchen Lack of Transportation (Non-Medical):   Physical Activity:   . Days of Exercise per Week:   . Minutes of Exercise per Session:   Stress:   . Feeling of Stress :   Social Connections:   . Frequency of Communication with Friends and Family:   . Frequency of Social Gatherings with Friends and Family:   . Attends Religious Services:   . Active Member of Clubs or Organizations:   . Attends Archivist Meetings:   Marland Kitchen Marital Status:   Intimate Partner Violence:   . Fear of Current or Ex-Partner:   . Emotionally Abused:   Marland Kitchen Physically Abused:   . Sexually Abused:     Family History  Problem Relation Age of Onset  . Cancer Mother   . Heart disease Father   . Breast cancer Paternal Aunt 87     Current Outpatient Medications:  .  acetaminophen (TYLENOL) 325 MG tablet, Take 325 mg by mouth 3 (three) times daily as needed for moderate pain or headache.,  Disp: , Rfl:  .  amLODipine (NORVASC) 5 MG tablet, Take 1 tablet (5 mg total) by mouth daily., Disp: 90 tablet, Rfl: 1 .  aspirin EC 81 MG tablet, Take 81 mg by mouth daily., Disp: , Rfl:  .  benazepril (LOTENSIN) 40 MG tablet, Take 1 tablet (40 mg total) by mouth daily., Disp: 90 tablet, Rfl: 1 .  Cholecalciferol (VITAMIN D3) 50 MCG (2000 UT) capsule, Take 4,000 Units by mouth daily. , Disp: , Rfl:  .  lidocaine-prilocaine (EMLA) cream, Apply to affected area once (Patient taking differently: Apply 1 application topically daily as needed (port access). ), Disp: 30 g, Rfl: 3 .  methimazole (TAPAZOLE) 5 MG tablet, Take 5 mg by mouth daily. , Disp: , Rfl:  .  metoprolol succinate (TOPROL-XL) 50 MG 24 hr tablet, Take 1 tablet (50 mg total) by mouth daily., Disp: 90 tablet, Rfl: 1 .  Multiple Vitamin (MULTI-VITAMINS) TABS, Take 1 tablet by mouth daily. , Disp: ,  Rfl:  .  ondansetron (ZOFRAN) 8 MG tablet, Take 1 tablet (8 mg total) by mouth 2 (two) times daily as needed for refractory nausea / vomiting., Disp: 60 tablet, Rfl: 2 .  OVER THE COUNTER MEDICATION, Take 4 capsules by mouth in the morning and at bedtime. Omega 3, resveratrol, icariin, curcumin  (Relief Factor), Disp: , Rfl:  .  Phenylephrine HCl (NEO-SYNEPHRINE NA), Place 1 spray into the nose daily as needed (congestion)., Disp: , Rfl:  .  Probiotic Product (PROBIOTIC ADVANCED PO), Take 1-2 capsules by mouth daily. When taking antibiotics pt will take 2 capsules instead of 1, normally just takes 1 per day otherwise, Disp: , Rfl:  .  prochlorperazine (COMPAZINE) 10 MG tablet, Take 1 tablet (10 mg total) by mouth every 6 (six) hours as needed (Nausea or vomiting)., Disp: 60 tablet, Rfl: 2 .  silver sulfADIAZINE (SILVADENE) 1 % cream, Apply 1 application topically daily. (Patient taking differently: Apply 1 application topically daily as needed (burn). ), Disp: 50 g, Rfl: 0 .  Sodium Hyaluronate, oral, (HYALURONIC ACID PO), Take 1 tablet by mouth  daily., Disp: , Rfl:  No current facility-administered medications for this visit.  Facility-Administered Medications Ordered in Other Visits:  .  heparin lock flush 100 unit/mL, 500 Units, Intravenous, Once, Lloyd Huger, MD  Physical exam:  Vitals:   12/28/19 0906  BP: (!) 106/54  Pulse: 95  Resp: 18  Temp: 98 F (36.7 C)  TempSrc: Tympanic  Weight: 156 lb 6.4 oz (70.9 kg)   Physical Exam HENT:     Head: Normocephalic and atraumatic.  Eyes:     Pupils: Pupils are equal, round, and reactive to light.  Cardiovascular:     Rate and Rhythm: Normal rate and regular rhythm.     Heart sounds: Normal heart sounds.  Pulmonary:     Effort: Pulmonary effort is normal.     Breath sounds: Normal breath sounds.  Abdominal:     General: Bowel sounds are normal.     Palpations: Abdomen is soft.  Musculoskeletal:     Cervical back: Normal range of motion.  Skin:    General: Skin is warm and dry.  Neurological:     Mental Status: She is alert and oriented to person, place, and time.      CMP Latest Ref Rng & Units 12/28/2019  Glucose 70 - 99 mg/dL 130(H)  BUN 8 - 23 mg/dL 9  Creatinine 0.44 - 1.00 mg/dL 0.47  Sodium 135 - 145 mmol/L 138  Potassium 3.5 - 5.1 mmol/L 3.6  Chloride 98 - 111 mmol/L 104  CO2 22 - 32 mmol/L 24  Calcium 8.9 - 10.3 mg/dL 8.7(L)  Total Protein 6.5 - 8.1 g/dL 6.5  Total Bilirubin 0.3 - 1.2 mg/dL 0.8  Alkaline Phos 38 - 126 U/L 115  AST 15 - 41 U/L 46(H)  ALT 0 - 44 U/L 42   CBC Latest Ref Rng & Units 12/14/2019  WBC 4.0 - 10.5 K/uL 7.9  Hemoglobin 12.0 - 15.0 g/dL 10.2(L)  Hematocrit 36 - 46 % 29.3(L)  Platelets 150 - 400 K/uL 344      Assessment and plan- Patient is a 79 y.o. female with stage IIIb right colon cancer here for on treatment assessment prior to cycle 3 of adjuvant FOLFOX chemotherapy  FOLFOX chemotherapy today.  Pump disconnect on day 3.  Return to clinic in 2 weeks.  Port labs: CBC with differential, CMP.  See Dr. Grayland Ormond.   Gets FOLFOX  chemotherapy on day 1 and pump disconnect on day 3  Overall patient is tolerating chemotherapy well without any significant nausea or vomiting or peripheral neuropathy so far  Normocytic anemia: Possibly secondary to malignancy/ongoing chemotherapy.  Continue to monitor  Hypokalemia: Resolved.  Abnormal LFTs: AST mildly elevated at 46.  Continue to monitor    Visit Diagnosis 1. Encounter for antineoplastic chemotherapy   2. 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 Abnormal LFTs      Dr. Randa Evens, MD, MPH [facility] [phone] 12/28/2019 8:22 AM

## 2019-12-28 NOTE — Progress Notes (Signed)
Dr Gary Fleet pt in for follow up and treatment today.

## 2019-12-29 ENCOUNTER — Telehealth: Payer: Self-pay | Admitting: Oncology

## 2019-12-29 NOTE — Telephone Encounter (Signed)
Patient phoned on this date and stated that she wanted to move her appt on 01-12-20 to 01-11-20 as she wanted her appts on Mondays. Writer informed patient that Dr. Grayland Ormond would not be in the office that day and patient stated that she was ok seeing the MD Janese Banks) that she saw on 12-28-19 and wanted to schedule again with her. Appts moved per patient's request.

## 2019-12-30 ENCOUNTER — Other Ambulatory Visit: Payer: Self-pay | Admitting: Surgery

## 2019-12-30 ENCOUNTER — Inpatient Hospital Stay: Payer: Medicare Other

## 2019-12-30 ENCOUNTER — Other Ambulatory Visit: Payer: Self-pay

## 2019-12-30 DIAGNOSIS — R945 Abnormal results of liver function studies: Secondary | ICD-10-CM | POA: Diagnosis not present

## 2019-12-30 DIAGNOSIS — C182 Malignant neoplasm of ascending colon: Secondary | ICD-10-CM | POA: Diagnosis not present

## 2019-12-30 DIAGNOSIS — M129 Arthropathy, unspecified: Secondary | ICD-10-CM | POA: Diagnosis not present

## 2019-12-30 DIAGNOSIS — Z85828 Personal history of other malignant neoplasm of skin: Secondary | ICD-10-CM | POA: Diagnosis not present

## 2019-12-30 DIAGNOSIS — Z1231 Encounter for screening mammogram for malignant neoplasm of breast: Secondary | ICD-10-CM

## 2019-12-30 DIAGNOSIS — D649 Anemia, unspecified: Secondary | ICD-10-CM | POA: Diagnosis not present

## 2019-12-30 DIAGNOSIS — Z5111 Encounter for antineoplastic chemotherapy: Secondary | ICD-10-CM | POA: Diagnosis not present

## 2019-12-30 MED ORDER — HEPARIN SOD (PORK) LOCK FLUSH 100 UNIT/ML IV SOLN
INTRAVENOUS | Status: AC
  Filled 2019-12-30: qty 5

## 2019-12-30 MED ORDER — SODIUM CHLORIDE 0.9% FLUSH
10.0000 mL | INTRAVENOUS | Status: DC | PRN
  Administered 2019-12-30: 10 mL
  Filled 2019-12-30: qty 10

## 2019-12-30 MED ORDER — HEPARIN SOD (PORK) LOCK FLUSH 100 UNIT/ML IV SOLN
500.0000 [IU] | Freq: Once | INTRAVENOUS | Status: AC | PRN
  Administered 2019-12-30: 500 [IU]
  Filled 2019-12-30: qty 5

## 2020-01-10 NOTE — Progress Notes (Signed)
Hematology/Oncology Consult note [facility]  [phone] [phone]  Patient Care Team: Valerie Roys, DO as PCP - General (Family Medicine) Guadalupe Maple, MD as PCP - Family Medicine (Family Medicine) Ronny Bacon, MD as Consulting Physician (General Surgery) Lloyd Huger, MD as Consulting Physician (Oncology)   Name of the patient: Maria Davies  [id number]  09-21-1940   Date of visit: 01/10/20  Diagnosis- stage IIIb right colon cancer  Chief complaint/ Reason for visit-on treatment assessment prior to cycle 4 of adjuvant FOLFOX chemotherapy  Heme/Onc history: Patient is a 79 year old female who sees Dr. Grayland Ormond as an outpatient.  She has a history of stage I right breast cancer back in 2011 s/p surgery, radiation treatment and 5 years of anastrozole.  She was recently diagnosed with T3 N1 M0 stage IIIb right colon cancer s/p hemicolectomy and is receiving adjuvant FOLFOX chemotherapy   Interval history-tolerating chemotherapy well so far.  Denies any nausea or vomiting.  Appetite and weight has been stable.  She does have some cold sensitivity on the day of chemo but it does not last.  ECOG PS- 1 Pain scale- 0   Review of systems- Review of Systems  Constitutional: Negative for chills, fever, malaise/fatigue and weight loss.  HENT: Negative for congestion, ear discharge and nosebleeds.   Eyes: Negative for blurred vision.  Respiratory: Negative for cough, hemoptysis, sputum production, shortness of breath and wheezing.   Cardiovascular: Negative for chest pain, palpitations, orthopnea and claudication.  Gastrointestinal: Negative for abdominal pain, blood in stool, constipation, diarrhea, heartburn, melena, nausea and vomiting.  Genitourinary: Negative for dysuria, flank pain, frequency, hematuria and urgency.  Musculoskeletal: Negative for back pain, joint pain and myalgias.  Skin: Negative for rash.    Neurological: Negative for dizziness, tingling, focal weakness, seizures, weakness and headaches.  Endo/Heme/Allergies: Does not bruise/bleed easily.  Psychiatric/Behavioral: Negative for depression and suicidal ideas. The patient does not have insomnia.       No Known Allergies   Past Medical History:  Diagnosis Date  . Anemia   . Arthritis   . Basal cell carcinoma    back  . Breast cancer ([place]) 2010   RT LUMPECTOMY  . Cancer ([facility])    rt breast  . Chemotherapy induced nausea and vomiting   . Heart murmur   . Hypertension   . Hypothyroidism    currently not on meds  . Osteopenia   . Personal history of chemotherapy 2010   right breast ca  . Personal history of radiation therapy 2010   right breast ca  . Port-A-Cath in place      Past Surgical History:  Procedure Laterality Date  . BACK SURGERY    . BREAST EXCISIONAL BIOPSY Right 03/2009   radiation and chemo +  . BREAST EXCISIONAL BIOPSY Bilateral    NEG  . BREAST SURGERY    . CATARACT EXTRACTION W/ INTRAOCULAR LENS  IMPLANT, BILATERAL    . CHOLECYSTECTOMY    . COLONOSCOPY WITH PROPOFOL N/A 10/19/2019   Procedure: COLONOSCOPY WITH PROPOFOL-attempted, unable to perform poor prep;  Surgeon: Lucilla Lame, MD;  Location: [facility];  Service: Endoscopy;  Laterality: N/A;  priority 3  . COLONOSCOPY WITH PROPOFOL N/A 10/20/2019   Procedure: COLONOSCOPY WITH PROPOFOL;  Surgeon: Lucilla Lame, MD;  Location: [facility] ENDOSCOPY;  Service: Endoscopy;  Laterality: N/A;  . ESOPHAGEAL DILATION  10/19/2019   Procedure: ESOPHAGEAL DILATION;  Surgeon: Lucilla Lame, MD;  Location: [facility];  Service: Endoscopy;;  . ESOPHAGOGASTRODUODENOSCOPY (EGD) WITH PROPOFOL N/A 10/19/2019   Procedure: ESOPHAGOGASTRODUODENOSCOPY (EGD) WITH PROPOFOL;  Surgeon: Lucilla Lame, MD;  Location: [facility];  Service: Endoscopy;  Laterality: N/A;  . PORTACATH PLACEMENT Right 11/23/2019   Procedure: INSERTION PORT-A-CATH;  Surgeon:  Ronny Bacon, MD;  Location: [facility];  Service: General;  Laterality: Right;    Social History   Socioeconomic History  . Marital status: Widowed    Spouse name: Not on file  . Number of children: Not on file  . Years of education: Not on file  . Highest education level: Not on file  Occupational History  . Occupation: retired   Tobacco Use  . Smoking status: Never Smoker  . Smokeless tobacco: Never Used  Vaping Use  . Vaping Use: Never used  Substance and Sexual Activity  . Alcohol use: Not Currently    Alcohol/week: 0.0 standard drinks    Comment: No alcohol since 08/05/19  . Drug use: No  . Sexual activity: Not on file  Other Topics Concern  . Not on file  Social History Narrative  . Not on file   Social Determinants of Health   Financial Resource Strain:   . Difficulty of Paying Living Expenses:   Food Insecurity:   . Worried About Charity fundraiser in the Last Year:   . Arboriculturist in the Last Year:   Transportation Needs:   . Film/video editor (Medical):   Marland Kitchen Lack of Transportation (Non-Medical):   Physical Activity:   . Days of Exercise per Week:   . Minutes of Exercise per Session:   Stress:   . Feeling of Stress :   Social Connections:   . Frequency of Communication with Friends and Family:   . Frequency of Social Gatherings with Friends and Family:   . Attends Religious Services:   . Active Member of Clubs or Organizations:   . Attends Archivist Meetings:   Marland Kitchen Marital Status:   Intimate Partner Violence:   . Fear of Current or Ex-Partner:   . Emotionally Abused:   Marland Kitchen Physically Abused:   . Sexually Abused:     Family History  Problem Relation Age of Onset  . Cancer Mother   . Heart disease Father   . Breast cancer Paternal Aunt 75     Current Outpatient Medications:  .  acetaminophen (TYLENOL) 325 MG tablet, Take 325 mg by mouth 3 (three) times daily as needed for moderate pain or headache., Disp: , Rfl:  .   amLODipine (NORVASC) 5 MG tablet, Take 1 tablet (5 mg total) by mouth daily., Disp: 90 tablet, Rfl: 1 .  aspirin EC 81 MG tablet, Take 81 mg by mouth daily., Disp: , Rfl:  .  benazepril (LOTENSIN) 40 MG tablet, Take 1 tablet (40 mg total) by mouth daily., Disp: 90 tablet, Rfl: 1 .  Cholecalciferol (VITAMIN D3) 50 MCG (2000 UT) capsule, Take 4,000 Units by mouth daily. , Disp: , Rfl:  .  lidocaine-prilocaine (EMLA) cream, Apply to affected area once (Patient taking differently: Apply 1 application topically daily as needed (port access). ), Disp: 30 g, Rfl: 3 .  methimazole (TAPAZOLE) 5 MG tablet, Take 5 mg by mouth daily. , Disp: , Rfl:  .  metoprolol succinate (TOPROL-XL) 50 MG 24 hr tablet, Take 1 tablet (50 mg total) by mouth daily., Disp: 90 tablet, Rfl: 1 .  Multiple Vitamin (MULTI-VITAMINS) TABS, Take 1 tablet by mouth daily. , Disp: ,  Rfl:  .  ondansetron (ZOFRAN) 8 MG tablet, Take 1 tablet (8 mg total) by mouth 2 (two) times daily as needed for refractory nausea / vomiting., Disp: 60 tablet, Rfl: 2 .  OVER THE COUNTER MEDICATION, Take 4 capsules by mouth in the morning and at bedtime. Omega 3, resveratrol, icariin, curcumin  (Relief Factor), Disp: , Rfl:  .  Phenylephrine HCl (NEO-SYNEPHRINE NA), Place 1 spray into the nose daily as needed (congestion)., Disp: , Rfl:  .  Probiotic Product (PROBIOTIC ADVANCED PO), Take 1-2 capsules by mouth daily. When taking antibiotics pt will take 2 capsules instead of 1, normally just takes 1 per day otherwise, Disp: , Rfl:  .  prochlorperazine (COMPAZINE) 10 MG tablet, Take 1 tablet (10 mg total) by mouth every 6 (six) hours as needed (Nausea or vomiting)., Disp: 60 tablet, Rfl: 2 .  silver sulfADIAZINE (SILVADENE) 1 % cream, Apply 1 application topically daily. (Patient not taking: Reported on 12/28/2019), Disp: 50 g, Rfl: 0 .  Sodium Hyaluronate, oral, (HYALURONIC ACID PO), Take 1 tablet by mouth daily., Disp: , Rfl:   Physical exam:  Vitals:    01/11/20 0951  BP: 102/70  Pulse: 71  Resp: 18  Temp: (!) 96.1 F (35.6 C)  TempSrc: Tympanic  SpO2: 100%  Weight: 155 lb 11.2 oz (70.6 kg)   Physical Exam Cardiovascular:     Rate and Rhythm: Normal rate and regular rhythm.     Heart sounds: Normal heart sounds.  Pulmonary:     Effort: Pulmonary effort is normal.     Breath sounds: Normal breath sounds.  Abdominal:     General: Bowel sounds are normal.     Palpations: Abdomen is soft.  Skin:    General: Skin is warm and dry.  Neurological:     Mental Status: She is alert and oriented to person, place, and time.      CMP Latest Ref Rng & Units 01/11/2020  Glucose 70 - 99 mg/dL 130(H)  BUN 8 - 23 mg/dL 9  Creatinine 0.44 - 1.00 mg/dL 0.49  Sodium 135 - 145 mmol/L 138  Potassium 3.5 - 5.1 mmol/L 3.6  Chloride 98 - 111 mmol/L 106  CO2 22 - 32 mmol/L 25  Calcium 8.9 - 10.3 mg/dL 8.8(L)  Total Protein 6.5 - 8.1 g/dL 6.3(L)  Total Bilirubin 0.3 - 1.2 mg/dL 0.9  Alkaline Phos 38 - 126 U/L 102  AST 15 - 41 U/L 35  ALT 0 - 44 U/L 31   CBC Latest Ref Rng & Units 01/11/2020  WBC 4.0 - 10.5 K/uL 4.0  Hemoglobin 12.0 - 15.0 g/dL 10.5(L)  Hematocrit 36 - 46 % 31.2(L)  Platelets 150 - 400 K/uL 160      Assessment and plan- Patient is a 79 y.o. female with stage IIIb right colon cancer here for on treatment assessment prior to cycle 4 of adjuvant FOLFOX chemotherapy  Patient is tolerating chemotherapy well so far without any significant side effects.  No signs and symptoms of peripheral neuropathy.  She does have moderate anemia which is overall stable around 10.  Pounds otherwise okay to proceed with cycle 4 of adjuvant FOLFOX chemotherapy today.  She will be seen by Dr. Grayland Ormond in 2 weeks with CBC with differential and CMP ferritin and iron studies B12 and folate for cycle 5  Abnormal LFTs: Resolved.  Normocytic anemia: Patient noted to have iron deficiency back in February 2021.  She took oral iron briefly.  I will repeat  ferritin iron studies B12 and folate with the next set of labs.  If she continues to be iron deficient consideration can be given for IV iron.   Visit Diagnosis 1. Encounter for antineoplastic chemotherapy   2. Abnormal LFTs   3. Malignant neoplasm of ascending colon ([place])   4. Normocytic anemia   5. History of iron deficiency      Dr. Randa Evens, MD, MPH [facility] at [facility] [phone] 01/11/2020 10:08 AM

## 2020-01-11 ENCOUNTER — Inpatient Hospital Stay (HOSPITAL_BASED_OUTPATIENT_CLINIC_OR_DEPARTMENT_OTHER): Payer: Medicare Other | Admitting: Oncology

## 2020-01-11 ENCOUNTER — Inpatient Hospital Stay: Payer: Medicare Other

## 2020-01-11 ENCOUNTER — Other Ambulatory Visit: Payer: Self-pay

## 2020-01-11 ENCOUNTER — Encounter: Payer: Self-pay | Admitting: Oncology

## 2020-01-11 VITALS — BP 102/70 | HR 71 | Temp 96.1°F | Resp 18 | Wt 155.7 lb

## 2020-01-11 VITALS — BP 102/62 | HR 59 | Resp 16

## 2020-01-11 DIAGNOSIS — R945 Abnormal results of liver function studies: Secondary | ICD-10-CM

## 2020-01-11 DIAGNOSIS — Z85828 Personal history of other malignant neoplasm of skin: Secondary | ICD-10-CM | POA: Diagnosis not present

## 2020-01-11 DIAGNOSIS — Z5111 Encounter for antineoplastic chemotherapy: Secondary | ICD-10-CM

## 2020-01-11 DIAGNOSIS — Z8639 Personal history of other endocrine, nutritional and metabolic disease: Secondary | ICD-10-CM | POA: Diagnosis not present

## 2020-01-11 DIAGNOSIS — C182 Malignant neoplasm of ascending colon: Secondary | ICD-10-CM

## 2020-01-11 DIAGNOSIS — M129 Arthropathy, unspecified: Secondary | ICD-10-CM | POA: Diagnosis not present

## 2020-01-11 DIAGNOSIS — D649 Anemia, unspecified: Secondary | ICD-10-CM

## 2020-01-11 DIAGNOSIS — Z452 Encounter for adjustment and management of vascular access device: Secondary | ICD-10-CM

## 2020-01-11 DIAGNOSIS — R7989 Other specified abnormal findings of blood chemistry: Secondary | ICD-10-CM

## 2020-01-11 LAB — COMPREHENSIVE METABOLIC PANEL
ALT: 31 U/L (ref 0–44)
AST: 35 U/L (ref 15–41)
Albumin: 3.2 g/dL — ABNORMAL LOW (ref 3.5–5.0)
Alkaline Phosphatase: 102 U/L (ref 38–126)
Anion gap: 7 (ref 5–15)
BUN: 9 mg/dL (ref 8–23)
CO2: 25 mmol/L (ref 22–32)
Calcium: 8.8 mg/dL — ABNORMAL LOW (ref 8.9–10.3)
Chloride: 106 mmol/L (ref 98–111)
Creatinine, Ser: 0.49 mg/dL (ref 0.44–1.00)
GFR calc Af Amer: >60 mL/min (ref >60)
GFR calc non Af Amer: >60 mL/min (ref >60)
Glucose, Bld: 130 mg/dL — ABNORMAL HIGH (ref 70–99)
Potassium: 3.6 mmol/L (ref 3.5–5.1)
Sodium: 138 mmol/L (ref 135–145)
Total Bilirubin: 0.9 mg/dL (ref 0.3–1.2)
Total Protein: 6.3 g/dL — ABNORMAL LOW (ref 6.5–8.1)

## 2020-01-11 LAB — CBC WITH DIFFERENTIAL/PLATELET
Abs Immature Granulocytes: 0.01 10*3/uL (ref 0.00–0.07)
Basophils Absolute: 0.1 10*3/uL (ref 0.0–0.1)
Basophils Relative: 2 %
Eosinophils Absolute: 0.1 10*3/uL (ref 0.0–0.5)
Eosinophils Relative: 3 %
HCT: 31.2 % — ABNORMAL LOW (ref 36.0–46.0)
Hemoglobin: 10.5 g/dL — ABNORMAL LOW (ref 12.0–15.0)
Immature Granulocytes: 0 %
Lymphocytes Relative: 38 %
Lymphs Abs: 1.6 10*3/uL (ref 0.7–4.0)
MCH: 29.8 pg (ref 26.0–34.0)
MCHC: 33.7 g/dL (ref 30.0–36.0)
MCV: 88.6 fL (ref 80.0–100.0)
Monocytes Absolute: 0.7 10*3/uL (ref 0.1–1.0)
Monocytes Relative: 18 %
Neutro Abs: 1.6 10*3/uL — ABNORMAL LOW (ref 1.7–7.7)
Neutrophils Relative %: 39 %
Platelets: 160 10*3/uL (ref 150–400)
RBC: 3.52 MIL/uL — ABNORMAL LOW (ref 3.87–5.11)
RDW: 17.1 % — ABNORMAL HIGH (ref 11.5–15.5)
WBC: 4 10*3/uL (ref 4.0–10.5)
nRBC: 0 % (ref 0.0–0.2)

## 2020-01-11 MED ORDER — FLUOROURACIL CHEMO INJECTION 2.5 GM/50ML
400.0000 mg/m2 | Freq: Once | INTRAVENOUS | Status: AC
  Administered 2020-01-11: 750 mg via INTRAVENOUS
  Filled 2020-01-11: qty 15

## 2020-01-11 MED ORDER — SODIUM CHLORIDE 0.9 % IV SOLN
10.0000 mg | Freq: Once | INTRAVENOUS | Status: AC
  Administered 2020-01-11: 10 mg via INTRAVENOUS
  Filled 2020-01-11: qty 10

## 2020-01-11 MED ORDER — PALONOSETRON HCL INJECTION 0.25 MG/5ML
0.2500 mg | Freq: Once | INTRAVENOUS | Status: AC
  Administered 2020-01-11: 0.25 mg via INTRAVENOUS
  Filled 2020-01-11: qty 5

## 2020-01-11 MED ORDER — OXALIPLATIN CHEMO INJECTION 100 MG/20ML
80.6000 mg/m2 | Freq: Once | INTRAVENOUS | Status: AC
  Administered 2020-01-11: 150 mg via INTRAVENOUS
  Filled 2020-01-11: qty 20

## 2020-01-11 MED ORDER — LEUCOVORIN CALCIUM INJECTION 350 MG
403.0000 mg/m2 | Freq: Once | INTRAVENOUS | Status: AC
  Administered 2020-01-11: 750 mg via INTRAVENOUS
  Filled 2020-01-11: qty 25

## 2020-01-11 MED ORDER — SODIUM CHLORIDE 0.9% FLUSH
10.0000 mL | INTRAVENOUS | Status: DC | PRN
  Administered 2020-01-11: 10 mL via INTRAVENOUS
  Filled 2020-01-11: qty 10

## 2020-01-11 MED ORDER — DEXTROSE 5 % IV SOLN
Freq: Once | INTRAVENOUS | Status: AC
  Filled 2020-01-11: qty 250

## 2020-01-11 MED ORDER — SODIUM CHLORIDE 0.9 % IV SOLN
2400.0000 mg/m2 | INTRAVENOUS | Status: DC
  Administered 2020-01-11: 4450 mg via INTRAVENOUS
  Filled 2020-01-11: qty 89

## 2020-01-12 ENCOUNTER — Ambulatory Visit: Payer: Medicare Other | Admitting: Oncology

## 2020-01-12 ENCOUNTER — Other Ambulatory Visit: Payer: Medicare Other

## 2020-01-12 ENCOUNTER — Ambulatory Visit: Payer: Medicare Other

## 2020-01-13 ENCOUNTER — Inpatient Hospital Stay: Payer: Medicare Other

## 2020-01-13 ENCOUNTER — Other Ambulatory Visit: Payer: Self-pay

## 2020-01-13 VITALS — BP 111/53 | HR 68 | Temp 97.2°F | Resp 17

## 2020-01-13 DIAGNOSIS — R945 Abnormal results of liver function studies: Secondary | ICD-10-CM | POA: Diagnosis not present

## 2020-01-13 DIAGNOSIS — M129 Arthropathy, unspecified: Secondary | ICD-10-CM | POA: Diagnosis not present

## 2020-01-13 DIAGNOSIS — Z5111 Encounter for antineoplastic chemotherapy: Secondary | ICD-10-CM | POA: Diagnosis not present

## 2020-01-13 DIAGNOSIS — C182 Malignant neoplasm of ascending colon: Secondary | ICD-10-CM

## 2020-01-13 DIAGNOSIS — D649 Anemia, unspecified: Secondary | ICD-10-CM | POA: Diagnosis not present

## 2020-01-13 DIAGNOSIS — Z85828 Personal history of other malignant neoplasm of skin: Secondary | ICD-10-CM | POA: Diagnosis not present

## 2020-01-13 MED ORDER — HEPARIN SOD (PORK) LOCK FLUSH 100 UNIT/ML IV SOLN
INTRAVENOUS | Status: AC
  Filled 2020-01-13: qty 5

## 2020-01-13 MED ORDER — HEPARIN SOD (PORK) LOCK FLUSH 100 UNIT/ML IV SOLN
500.0000 [IU] | Freq: Once | INTRAVENOUS | Status: AC | PRN
  Administered 2020-01-13: 500 [IU]
  Filled 2020-01-13: qty 5

## 2020-01-13 MED ORDER — SODIUM CHLORIDE 0.9% FLUSH
10.0000 mL | INTRAVENOUS | Status: DC | PRN
  Administered 2020-01-13: 10 mL
  Filled 2020-01-13: qty 10

## 2020-01-22 NOTE — Progress Notes (Signed)
[facility]  [phone] [phone]  ID: Maria Davies OB: [birth year]  MR#: [id number]  MGQ#:[id number]  Patient Care Team: Valerie Roys, DO as PCP - General (Family Medicine) Guadalupe Maple, MD as PCP - Family Medicine (Family Medicine) Ronny Bacon, MD as Consulting Physician (General Surgery) Lloyd Huger, MD as Consulting Physician (Oncology)  CHIEF COMPLAINT: Stage IIIb right colon cancer.  INTERVAL HISTORY: Patient returns to clinic today for further evaluation and consideration of cycle 5 of adjuvant FOLFOX.  She has a mild cold neuropathy for several days after her treatments, but otherwise is tolerating them well.  She currently feels well and is asymptomatic.  She has no other neurologic complaints.  She denies any recent fevers or illnesses.  She has a good appetite and denies weight loss.  She has no chest pain, shortness of breath, cough, or hemoptysis. She denies any nausea, vomiting, constipation, or diarrhea.  She has no melena or hematochezia.  She has no urinary complaints.  Patient offers no further specific complaints today.  REVIEW OF SYSTEMS:   Review of Systems  Constitutional: Negative.  Negative for fever, malaise/fatigue and weight loss.  Respiratory: Negative.  Negative for cough, hemoptysis and shortness of breath.   Cardiovascular: Negative.  Negative for chest pain and leg swelling.  Gastrointestinal: Negative.  Negative for abdominal pain, blood in stool and melena.  Genitourinary: Negative.  Negative for hematuria.  Musculoskeletal: Negative.  Negative for back pain.  Skin: Negative.  Negative for rash.  Neurological: Negative.  Negative for dizziness, focal weakness, weakness and headaches.  Psychiatric/Behavioral: Negative.  The patient is not nervous/anxious.     As per HPI. Otherwise, a complete review of systems is negative.  PAST MEDICAL HISTORY: Past Medical History:  Diagnosis Date  .  Anemia   . Arthritis   . Basal cell carcinoma    back  . Breast cancer ([place]) 2010   RT LUMPECTOMY  . Cancer ([place])    rt breast  . Chemotherapy induced nausea and vomiting   . Heart murmur   . Hypertension   . Hypothyroidism    currently not on meds  . Osteopenia   . Personal history of chemotherapy 2010   right breast ca  . Personal history of radiation therapy 2010   right breast ca  . Port-A-Cath in place     PAST SURGICAL HISTORY: Past Surgical History:  Procedure Laterality Date  . BACK SURGERY    . BREAST EXCISIONAL BIOPSY Right 03/2009   radiation and chemo +  . BREAST EXCISIONAL BIOPSY Bilateral    NEG  . BREAST SURGERY    . CATARACT EXTRACTION W/ INTRAOCULAR LENS  IMPLANT, BILATERAL    . CHOLECYSTECTOMY    . COLONOSCOPY WITH PROPOFOL N/A 10/19/2019   Procedure: COLONOSCOPY WITH PROPOFOL-attempted, unable to perform poor prep;  Surgeon: Lucilla Lame, MD;  Location: [facility];  Service: Endoscopy;  Laterality: N/A;  priority 3  . COLONOSCOPY WITH PROPOFOL N/A 10/20/2019   Procedure: COLONOSCOPY WITH PROPOFOL;  Surgeon: Lucilla Lame, MD;  Location: [facility] ENDOSCOPY;  Service: Endoscopy;  Laterality: N/A;  . ESOPHAGEAL DILATION  10/19/2019   Procedure: ESOPHAGEAL DILATION;  Surgeon: Lucilla Lame, MD;  Location: [facility];  Service: Endoscopy;;  . ESOPHAGOGASTRODUODENOSCOPY (EGD) WITH PROPOFOL N/A 10/19/2019   Procedure: ESOPHAGOGASTRODUODENOSCOPY (EGD) WITH PROPOFOL;  Surgeon: Lucilla Lame, MD;  Location: [facility];  Service: Endoscopy;  Laterality: N/A;  . PORTACATH PLACEMENT Right 11/23/2019   Procedure: INSERTION  PORT-A-CATH;  Surgeon: Ronny Bacon, MD;  Location: [facility];  Service: General;  Laterality: Right;    FAMILY HISTORY: Family History  Problem Relation Age of Onset  . Cancer Mother   . Heart disease Father   . Breast cancer Paternal Aunt 53    ADVANCED DIRECTIVES (Y/N):  N  HEALTH MAINTENANCE: Social History   Tobacco  Use  . Smoking status: Never Smoker  . Smokeless tobacco: Never Used  Vaping Use  . Vaping Use: Never used  Substance Use Topics  . Alcohol use: Not Currently    Alcohol/week: 0.0 standard drinks    Comment: No alcohol since 08/05/19  . Drug use: No     Colonoscopy:  PAP:  Bone density:  Lipid panel:  No Known Allergies  Current Outpatient Medications  Medication Sig Dispense Refill  . acetaminophen (TYLENOL) 325 MG tablet Take 325 mg by mouth 3 (three) times daily as needed for moderate pain or headache.    Marland Kitchen amLODipine (NORVASC) 5 MG tablet Take 1 tablet (5 mg total) by mouth daily. 90 tablet 1  . aspirin EC 81 MG tablet Take 81 mg by mouth daily.    . benazepril (LOTENSIN) 40 MG tablet Take 1 tablet (40 mg total) by mouth daily. 90 tablet 1  . Cholecalciferol (VITAMIN D3) 50 MCG (2000 UT) capsule Take 4,000 Units by mouth daily.     Marland Kitchen lidocaine-prilocaine (EMLA) cream Apply to affected area once (Patient taking differently: Apply 1 application topically daily as needed (port access). ) 30 g 3  . methimazole (TAPAZOLE) 5 MG tablet Take 5 mg by mouth daily.     . metoprolol succinate (TOPROL-XL) 50 MG 24 hr tablet Take 1 tablet (50 mg total) by mouth daily. 90 tablet 1  . Multiple Vitamin (MULTI-VITAMINS) TABS Take 1 tablet by mouth daily.     . ondansetron (ZOFRAN) 8 MG tablet Take 1 tablet (8 mg total) by mouth 2 (two) times daily as needed for refractory nausea / vomiting. (Patient not taking: Reported on 01/11/2020) 60 tablet 2  . OVER THE COUNTER MEDICATION Take 4 capsules by mouth in the morning and at bedtime. Omega 3, resveratrol, icariin, curcumin  (Relief Factor)    . Phenylephrine HCl (NEO-SYNEPHRINE NA) Place 1 spray into the nose daily as needed (congestion).    . Probiotic Product (PROBIOTIC ADVANCED PO) Take 1-2 capsules by mouth daily. When taking antibiotics pt will take 2 capsules instead of 1, normally just takes 1 per day otherwise    . prochlorperazine  (COMPAZINE) 10 MG tablet Take 1 tablet (10 mg total) by mouth every 6 (six) hours as needed (Nausea or vomiting). 60 tablet 2  . silver sulfADIAZINE (SILVADENE) 1 % cream Apply 1 application topically daily. 50 g 0  . Sodium Hyaluronate, oral, (HYALURONIC ACID PO) Take 1 tablet by mouth daily.     No current facility-administered medications for this visit.   Facility-Administered Medications Ordered in Other Visits  Medication Dose Route Frequency Provider Last Rate Last Admin  . dexamethasone (DECADRON) 10 mg in sodium chloride 0.9 % 50 mL IVPB  10 mg Intravenous Once Lloyd Huger, MD      . fluorouracil (ADRUCIL) 4,450 mg in sodium chloride 0.9 % 61 mL chemo infusion  2,400 mg/m2 (Treatment Plan Recorded) Intravenous 1 day or 1 dose Lloyd Huger, MD      . fluorouracil (ADRUCIL) chemo injection 750 mg  400 mg/m2 (Treatment Plan Recorded) Intravenous Once Lloyd Huger, MD      .  leucovorin 750 mg in dextrose 5 % 250 mL infusion  403 mg/m2 (Treatment Plan Recorded) Intravenous Once Lloyd Huger, MD      . oxaliplatin (ELOXATIN) 150 mg in dextrose 5 % 500 mL chemo infusion  81 mg/m2 (Treatment Plan Recorded) Intravenous Once Lloyd Huger, MD      . palonosetron (ALOXI) injection 0.25 mg  0.25 mg Intravenous Once Lloyd Huger, MD      . sodium chloride flush (NS) 0.9 % injection 10 mL  10 mL Intravenous Once Lloyd Huger, MD        OBJECTIVE: Vitals:   01/25/20 0909  BP: 132/84  Pulse: 78  Resp: 18  Temp: (!) 97.5 F (36.4 C)     Body mass index is 27.9 kg/m.    ECOG FS:0 - Asymptomatic  General: Well-developed, well-nourished, no acute distress. Eyes: Pink conjunctiva, anicteric sclera. HEENT: Normocephalic, moist mucous membranes. Lungs: No audible wheezing or coughing. Heart: Regular rate and rhythm. Abdomen: Soft, nontender, no obvious distention. Musculoskeletal: No edema, cyanosis, or clubbing. Neuro: Alert, answering all  questions appropriately. Cranial nerves grossly intact. Skin: No rashes or petechiae noted. Psych: Normal affect.   LAB RESULTS:  Lab Results  Component Value Date   NA 141 01/25/2020   K 2.9 (L) 01/25/2020   CL 105 01/25/2020   CO2 27 01/25/2020   GLUCOSE 111 (H) 01/25/2020   BUN 6 (L) 01/25/2020   CREATININE 0.39 (L) 01/25/2020   CALCIUM 9.0 01/25/2020   PROT 6.1 (L) 01/25/2020   ALBUMIN 3.1 (L) 01/25/2020   AST 28 01/25/2020   ALT 22 01/25/2020   ALKPHOS 96 01/25/2020   BILITOT 0.6 01/25/2020   GFRNONAA >60 01/25/2020   GFRAA >60 01/25/2020    Lab Results  Component Value Date   WBC 4.3 01/25/2020   NEUTROABS 1.9 01/25/2020   HGB 10.0 (L) 01/25/2020   HCT 29.4 (L) 01/25/2020   MCV 88.8 01/25/2020   PLT 156 01/25/2020     STUDIES: No results found.  ASSESSMENT: Stage IIIb right colon cancer  PLAN:    1. Stage IIIb right colon cancer: Pathology results reviewed, confirming stage of disease.  CT scan results from Oct 28, 2019 reviewed independently  with no obvious evidence of metastatic disease.  Patient has now had port placement.  Proceed with cycle 5 of 12 of adjuvant FOLFOX today.  Return to clinic in 2 days for pump removal and then in 2 weeks for further evaluation and consideration of cycle 6. 2.  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  She completed 5 years of anastrozole in June 2016.  Her most recent mammogram on February 02, 2019 was reported as BI-RADS 1.  Repeat in August 2021. 3.  Anemia: Chronic and unchanged.  Hemoglobin 10.0 today. 4.  Leukocytosis: Resolved. 5.  Hypokalemia: Patient declined supplementation and wishes to reinitiate dietary changes.  Patient expressed understanding and was in agreement with this plan. She also understands that She can call clinic at any time with any questions, concerns, or complaints.   Cancer Staging Colon cancer  [facility]) Staging form: Colon and Rectum, AJCC 8th Edition - Clinical stage from 11/13/2019: Stage IIIB (cT3, cN1a, cM0) - Signed by Lloyd Huger, MD on 11/13/2019   Lloyd Huger, MD   01/25/2020 9:51 AM

## 2020-01-25 ENCOUNTER — Inpatient Hospital Stay: Payer: Medicare Other

## 2020-01-25 ENCOUNTER — Inpatient Hospital Stay: Payer: Medicare Other | Attending: Oncology

## 2020-01-25 ENCOUNTER — Encounter: Payer: Self-pay | Admitting: Oncology

## 2020-01-25 ENCOUNTER — Other Ambulatory Visit: Payer: Self-pay

## 2020-01-25 ENCOUNTER — Inpatient Hospital Stay (HOSPITAL_BASED_OUTPATIENT_CLINIC_OR_DEPARTMENT_OTHER): Payer: Medicare Other | Admitting: Oncology

## 2020-01-25 VITALS — BP 132/84 | HR 78 | Temp 97.5°F | Resp 18 | Wt 157.5 lb

## 2020-01-25 DIAGNOSIS — C182 Malignant neoplasm of ascending colon: Secondary | ICD-10-CM | POA: Diagnosis not present

## 2020-01-25 DIAGNOSIS — I10 Essential (primary) hypertension: Secondary | ICD-10-CM | POA: Insufficient documentation

## 2020-01-25 DIAGNOSIS — Z5111 Encounter for antineoplastic chemotherapy: Secondary | ICD-10-CM | POA: Diagnosis not present

## 2020-01-25 DIAGNOSIS — D649 Anemia, unspecified: Secondary | ICD-10-CM | POA: Insufficient documentation

## 2020-01-25 DIAGNOSIS — E039 Hypothyroidism, unspecified: Secondary | ICD-10-CM | POA: Diagnosis not present

## 2020-01-25 DIAGNOSIS — M8580 Other specified disorders of bone density and structure, unspecified site: Secondary | ICD-10-CM | POA: Insufficient documentation

## 2020-01-25 DIAGNOSIS — Z9221 Personal history of antineoplastic chemotherapy: Secondary | ICD-10-CM | POA: Insufficient documentation

## 2020-01-25 DIAGNOSIS — C189 Malignant neoplasm of colon, unspecified: Secondary | ICD-10-CM | POA: Insufficient documentation

## 2020-01-25 DIAGNOSIS — Z79899 Other long term (current) drug therapy: Secondary | ICD-10-CM | POA: Insufficient documentation

## 2020-01-25 DIAGNOSIS — Z923 Personal history of irradiation: Secondary | ICD-10-CM | POA: Diagnosis not present

## 2020-01-25 DIAGNOSIS — E876 Hypokalemia: Secondary | ICD-10-CM | POA: Diagnosis not present

## 2020-01-25 DIAGNOSIS — Z85828 Personal history of other malignant neoplasm of skin: Secondary | ICD-10-CM | POA: Insufficient documentation

## 2020-01-25 DIAGNOSIS — M129 Arthropathy, unspecified: Secondary | ICD-10-CM | POA: Diagnosis not present

## 2020-01-25 DIAGNOSIS — Z853 Personal history of malignant neoplasm of breast: Secondary | ICD-10-CM | POA: Diagnosis not present

## 2020-01-25 DIAGNOSIS — R7989 Other specified abnormal findings of blood chemistry: Secondary | ICD-10-CM

## 2020-01-25 DIAGNOSIS — R945 Abnormal results of liver function studies: Secondary | ICD-10-CM

## 2020-01-25 DIAGNOSIS — G629 Polyneuropathy, unspecified: Secondary | ICD-10-CM | POA: Insufficient documentation

## 2020-01-25 LAB — COMPREHENSIVE METABOLIC PANEL
ALT: 22 U/L (ref 0–44)
AST: 28 U/L (ref 15–41)
Albumin: 3.1 g/dL — ABNORMAL LOW (ref 3.5–5.0)
Alkaline Phosphatase: 96 U/L (ref 38–126)
Anion gap: 9 (ref 5–15)
BUN: 6 mg/dL — ABNORMAL LOW (ref 8–23)
CO2: 27 mmol/L (ref 22–32)
Calcium: 9 mg/dL (ref 8.9–10.3)
Chloride: 105 mmol/L (ref 98–111)
Creatinine, Ser: 0.39 mg/dL — ABNORMAL LOW (ref 0.44–1.00)
GFR calc Af Amer: >60 mL/min (ref >60)
GFR calc non Af Amer: >60 mL/min (ref >60)
Glucose, Bld: 111 mg/dL — ABNORMAL HIGH (ref 70–99)
Potassium: 2.9 mmol/L — ABNORMAL LOW (ref 3.5–5.1)
Sodium: 141 mmol/L (ref 135–145)
Total Bilirubin: 0.6 mg/dL (ref 0.3–1.2)
Total Protein: 6.1 g/dL — ABNORMAL LOW (ref 6.5–8.1)

## 2020-01-25 LAB — CBC WITH DIFFERENTIAL/PLATELET
Abs Immature Granulocytes: 0.03 10*3/uL (ref 0.00–0.07)
Basophils Absolute: 0 10*3/uL (ref 0.0–0.1)
Basophils Relative: 1 %
Eosinophils Absolute: 0.1 10*3/uL (ref 0.0–0.5)
Eosinophils Relative: 3 %
HCT: 29.4 % — ABNORMAL LOW (ref 36.0–46.0)
Hemoglobin: 10 g/dL — ABNORMAL LOW (ref 12.0–15.0)
Immature Granulocytes: 1 %
Lymphocytes Relative: 36 %
Lymphs Abs: 1.6 10*3/uL (ref 0.7–4.0)
MCH: 30.2 pg (ref 26.0–34.0)
MCHC: 34 g/dL (ref 30.0–36.0)
MCV: 88.8 fL (ref 80.0–100.0)
Monocytes Absolute: 0.7 10*3/uL (ref 0.1–1.0)
Monocytes Relative: 16 %
Neutro Abs: 1.9 10*3/uL (ref 1.7–7.7)
Neutrophils Relative %: 43 %
Platelets: 156 10*3/uL (ref 150–400)
RBC: 3.31 MIL/uL — ABNORMAL LOW (ref 3.87–5.11)
RDW: 17.7 % — ABNORMAL HIGH (ref 11.5–15.5)
WBC: 4.3 10*3/uL (ref 4.0–10.5)
nRBC: 0 % (ref 0.0–0.2)

## 2020-01-25 LAB — IRON AND TIBC
Iron: 86 ug/dL (ref 28–170)
Saturation Ratios: 28 % (ref 10.4–31.8)
TIBC: 307 ug/dL (ref 250–450)
UIBC: 221 ug/dL

## 2020-01-25 LAB — FOLATE: Folate: 41 ng/mL (ref >5.9)

## 2020-01-25 LAB — VITAMIN B12: Vitamin B-12: 297 pg/mL (ref 180–914)

## 2020-01-25 LAB — TSH: TSH: <0.01 u[IU]/mL — ABNORMAL LOW (ref 0.350–4.500)

## 2020-01-25 LAB — FERRITIN: Ferritin: 53 ng/mL (ref 11–307)

## 2020-01-25 MED ORDER — SODIUM CHLORIDE 0.9% FLUSH
10.0000 mL | Freq: Once | INTRAVENOUS | Status: DC
  Filled 2020-01-25: qty 10

## 2020-01-25 MED ORDER — DEXTROSE 5 % IV SOLN
Freq: Once | INTRAVENOUS | Status: AC
  Filled 2020-01-25: qty 250

## 2020-01-25 MED ORDER — OXALIPLATIN CHEMO INJECTION 100 MG/20ML
81.0000 mg/m2 | Freq: Once | INTRAVENOUS | Status: AC
  Administered 2020-01-25: 150 mg via INTRAVENOUS
  Filled 2020-01-25: qty 10

## 2020-01-25 MED ORDER — SODIUM CHLORIDE 0.9 % IV SOLN
2400.0000 mg/m2 | INTRAVENOUS | Status: DC
  Administered 2020-01-25: 4450 mg via INTRAVENOUS
  Filled 2020-01-25: qty 89

## 2020-01-25 MED ORDER — PALONOSETRON HCL INJECTION 0.25 MG/5ML
0.2500 mg | Freq: Once | INTRAVENOUS | Status: AC
  Administered 2020-01-25: 0.25 mg via INTRAVENOUS
  Filled 2020-01-25: qty 5

## 2020-01-25 MED ORDER — LEUCOVORIN CALCIUM INJECTION 350 MG
403.0000 mg/m2 | Freq: Once | INTRAVENOUS | Status: AC
  Administered 2020-01-25: 750 mg via INTRAVENOUS
  Filled 2020-01-25: qty 25

## 2020-01-25 MED ORDER — FLUOROURACIL CHEMO INJECTION 2.5 GM/50ML
400.0000 mg/m2 | Freq: Once | INTRAVENOUS | Status: AC
  Administered 2020-01-25: 750 mg via INTRAVENOUS
  Filled 2020-01-25: qty 15

## 2020-01-25 MED ORDER — SODIUM CHLORIDE 0.9 % IV SOLN
10.0000 mg | Freq: Once | INTRAVENOUS | Status: AC
  Administered 2020-01-25: 10 mg via INTRAVENOUS
  Filled 2020-01-25: qty 10

## 2020-01-25 NOTE — Progress Notes (Signed)
Pt in for follow up, reports having some constipation but started stool softener a few days ago.

## 2020-01-27 ENCOUNTER — Inpatient Hospital Stay: Payer: Medicare Other

## 2020-01-27 ENCOUNTER — Other Ambulatory Visit: Payer: Self-pay

## 2020-01-27 DIAGNOSIS — D649 Anemia, unspecified: Secondary | ICD-10-CM | POA: Diagnosis not present

## 2020-01-27 DIAGNOSIS — Z5111 Encounter for antineoplastic chemotherapy: Secondary | ICD-10-CM | POA: Diagnosis not present

## 2020-01-27 DIAGNOSIS — C182 Malignant neoplasm of ascending colon: Secondary | ICD-10-CM

## 2020-01-27 DIAGNOSIS — E876 Hypokalemia: Secondary | ICD-10-CM | POA: Diagnosis not present

## 2020-01-27 DIAGNOSIS — Z853 Personal history of malignant neoplasm of breast: Secondary | ICD-10-CM | POA: Diagnosis not present

## 2020-01-27 DIAGNOSIS — C189 Malignant neoplasm of colon, unspecified: Secondary | ICD-10-CM | POA: Diagnosis not present

## 2020-01-27 DIAGNOSIS — G629 Polyneuropathy, unspecified: Secondary | ICD-10-CM | POA: Diagnosis not present

## 2020-01-27 MED ORDER — SODIUM CHLORIDE 0.9% FLUSH
10.0000 mL | INTRAVENOUS | Status: DC | PRN
  Administered 2020-01-27: 10 mL
  Filled 2020-01-27: qty 10

## 2020-01-27 MED ORDER — HEPARIN SOD (PORK) LOCK FLUSH 100 UNIT/ML IV SOLN
INTRAVENOUS | Status: AC
  Filled 2020-01-27: qty 5

## 2020-01-27 MED ORDER — HEPARIN SOD (PORK) LOCK FLUSH 100 UNIT/ML IV SOLN
500.0000 [IU] | Freq: Once | INTRAVENOUS | Status: AC | PRN
  Administered 2020-01-27: 500 [IU]
  Filled 2020-01-27: qty 5

## 2020-01-27 NOTE — Progress Notes (Signed)
I called twice on tues. 8/10 and no answer and no machine to leave message

## 2020-01-28 NOTE — Progress Notes (Signed)
I was able to get in touch with pt today and she states that finnegan said that she could eat bananas and get her potassium up and sheis coming to see him 8/18 and it will be rechecked. I checked finnegan note and it says pt did not want to take pills and will modify her food intake to get potassium to come up

## 2020-01-30 ENCOUNTER — Other Ambulatory Visit: Payer: Self-pay | Admitting: Family Medicine

## 2020-01-30 DIAGNOSIS — I10 Essential (primary) hypertension: Secondary | ICD-10-CM

## 2020-01-30 NOTE — Telephone Encounter (Signed)
Requested Prescriptions  Pending Prescriptions Disp Refills   metoprolol succinate (TOPROL-XL) 50 MG 24 hr tablet [Pharmacy Med Name: METOPROLOL SUCCINATE ER 50 MG TAB] 90 tablet 0    Sig: TAKE 1 TABLET BY MOUTH ONCE DAILY     Cardiovascular:  Beta Blockers Passed - 01/30/2020 10:55 AM      Passed - Last BP in normal range    BP Readings from Last 1 Encounters:  01/25/20 132/84         Passed - Last Heart Rate in normal range    Pulse Readings from Last 1 Encounters:  01/25/20 78         Passed - Valid encounter within last 6 months    Recent Outpatient Visits          5 months ago Hypertension, unspecified type   [place], Megan P, DO   1 year ago Hypertension, unspecified type   [facility], [place]   1 year ago Aortic stenosis, mild   [facility] [place], Jeannette How, MD   1 year ago Hypertension, unspecified type   [facility] [place], Jeannette How, MD   2 years ago Hypertension, unspecified type   [place], [place], DO      Future Appointments            In 1 week Wynetta Emery, Barb Merino, DO [place], [place]   In 6 months  [employer], [place]

## 2020-02-01 DIAGNOSIS — C182 Malignant neoplasm of ascending colon: Secondary | ICD-10-CM | POA: Diagnosis not present

## 2020-02-01 DIAGNOSIS — E0590 Thyrotoxicosis, unspecified without thyrotoxic crisis or storm: Secondary | ICD-10-CM | POA: Diagnosis not present

## 2020-02-03 ENCOUNTER — Other Ambulatory Visit: Payer: Self-pay

## 2020-02-03 ENCOUNTER — Ambulatory Visit
Admission: RE | Admit: 2020-02-03 | Discharge: 2020-02-03 | Disposition: A | Discharge status: Home / Self Care | Payer: Medicare Other | Source: Ambulatory Visit | Attending: Surgery | Admitting: Surgery

## 2020-02-03 DIAGNOSIS — Z1231 Encounter for screening mammogram for malignant neoplasm of breast: Secondary | ICD-10-CM | POA: Diagnosis not present

## 2020-02-03 IMAGING — MG DIGITAL SCREENING BILAT W/ TOMO W/ CAD
8 series · 8 of 24 positions shown · non-contrast
Comparison: Previous exam(s).

CLINICAL DATA: Screening.

EXAM:
DIGITAL SCREENING BILATERAL MAMMOGRAM WITH TOMO AND CAD

[L MLO synth-2D]
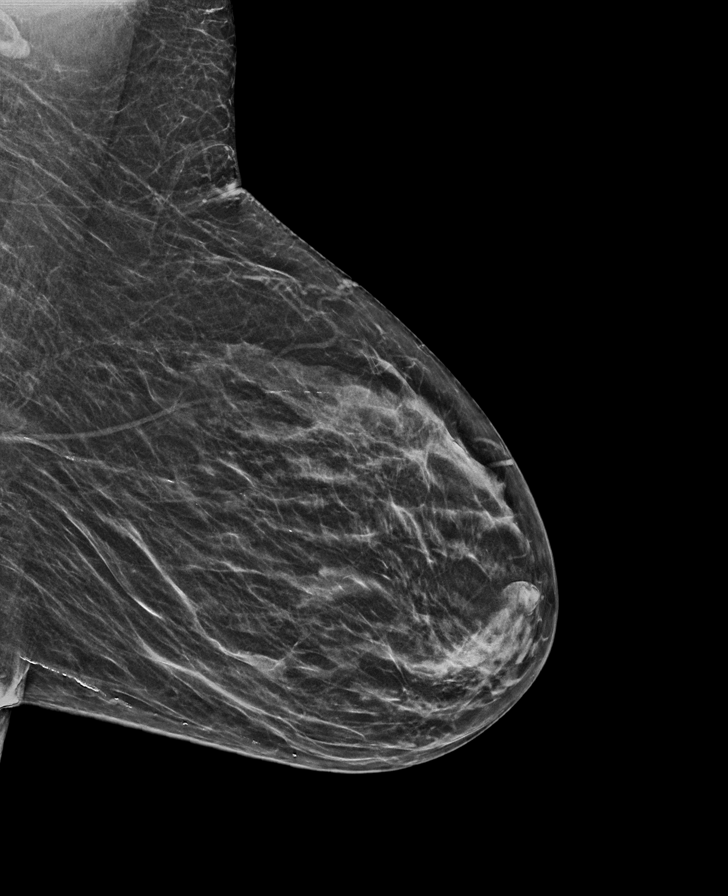

[L CC synth-2D]
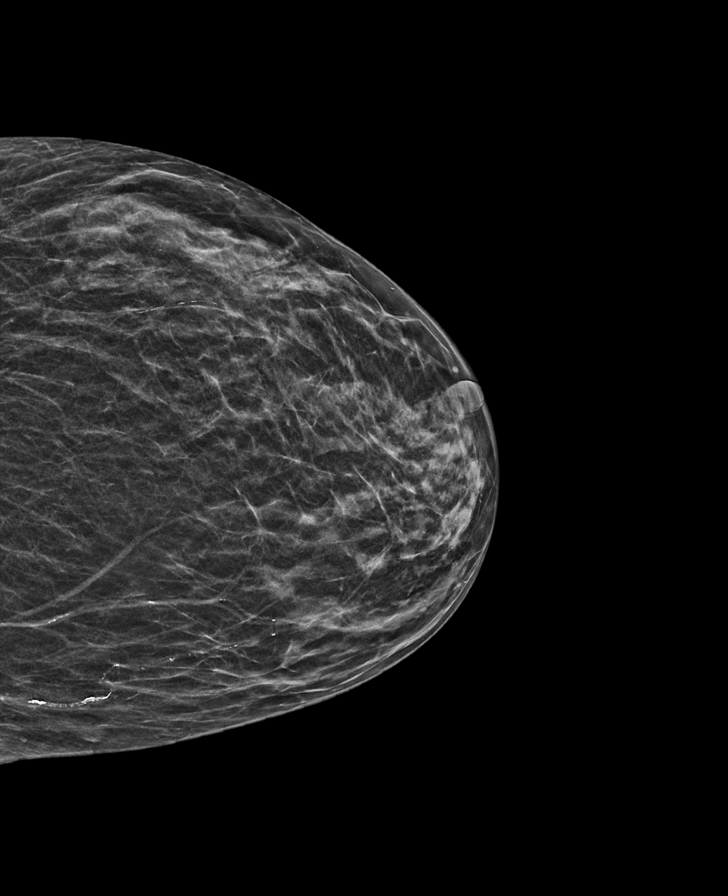

[R MLO synth-2D]
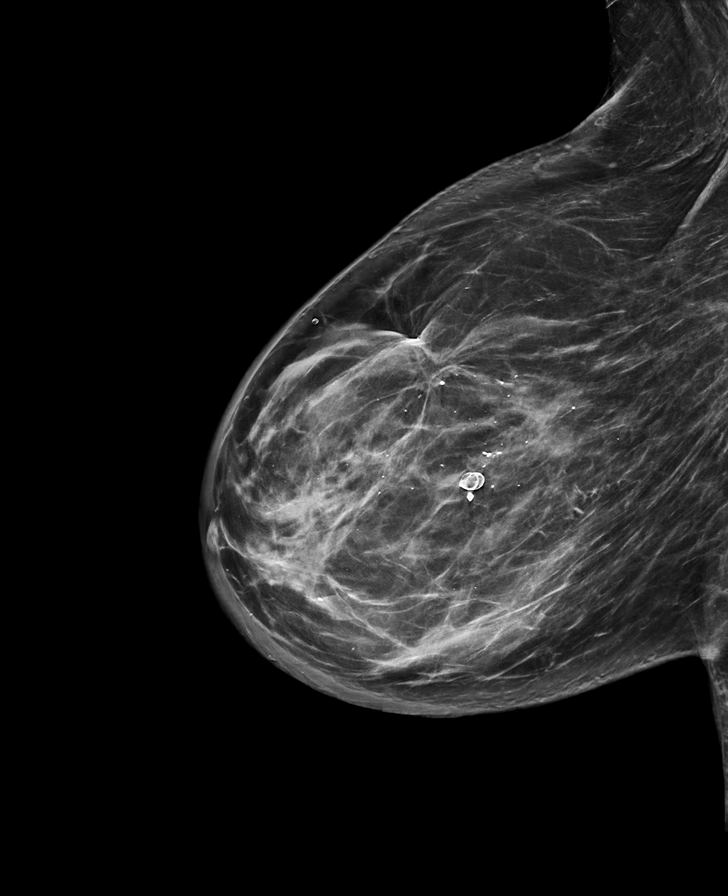

[R CC synth-2D]
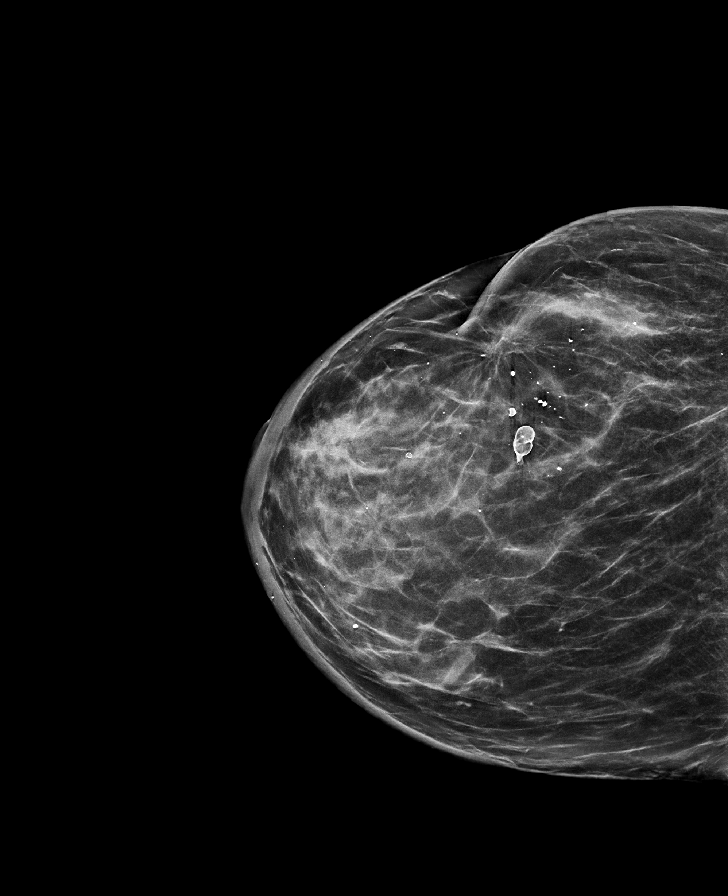

[L CC tomo · tomo slice 20/39.0]
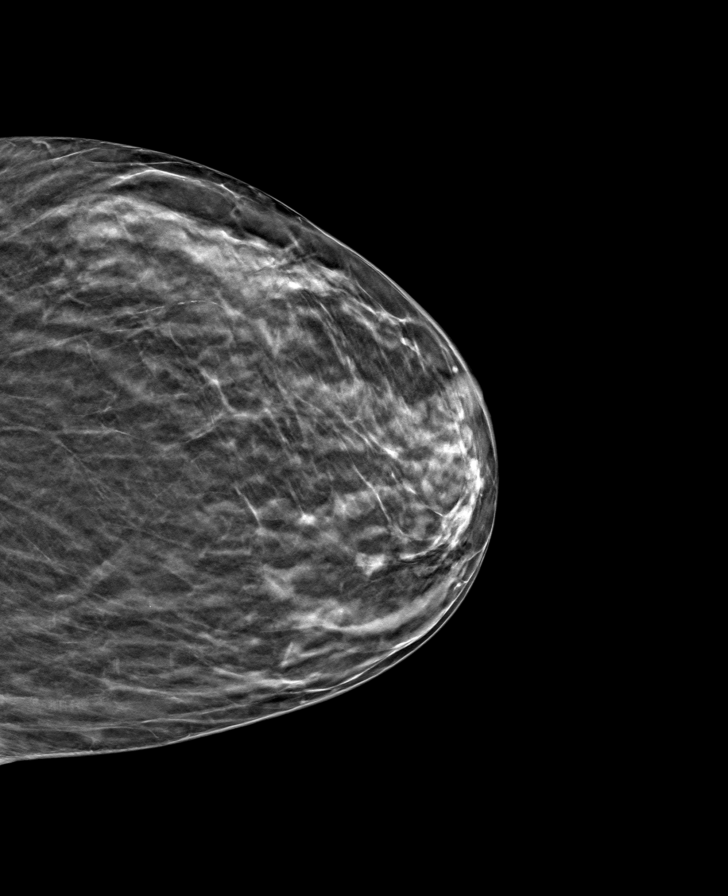

[L MLO tomo · tomo slice 23/46.0]
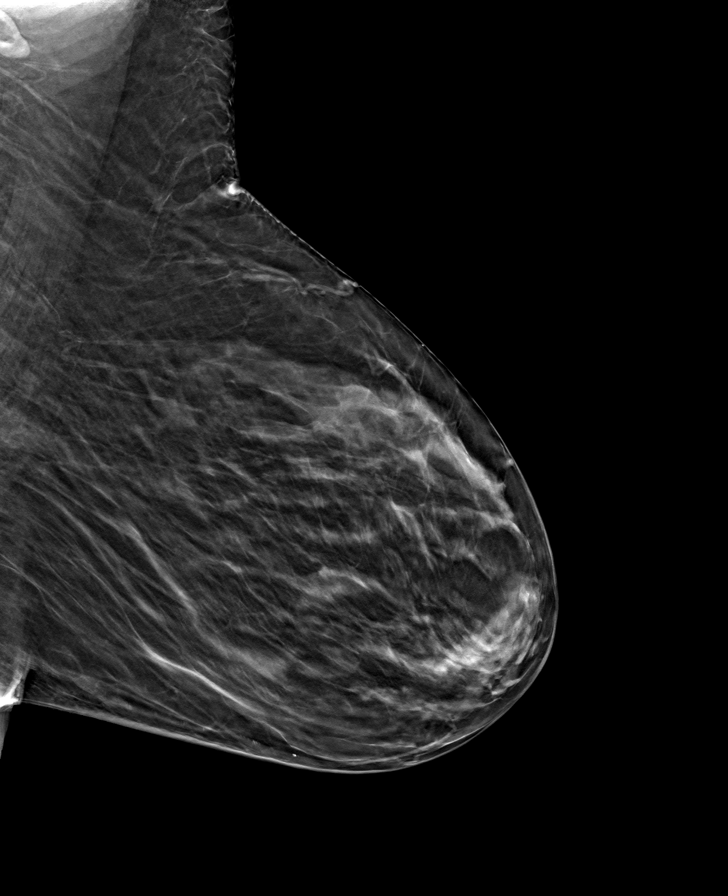

[R MLO tomo · tomo slice 39/77.0]
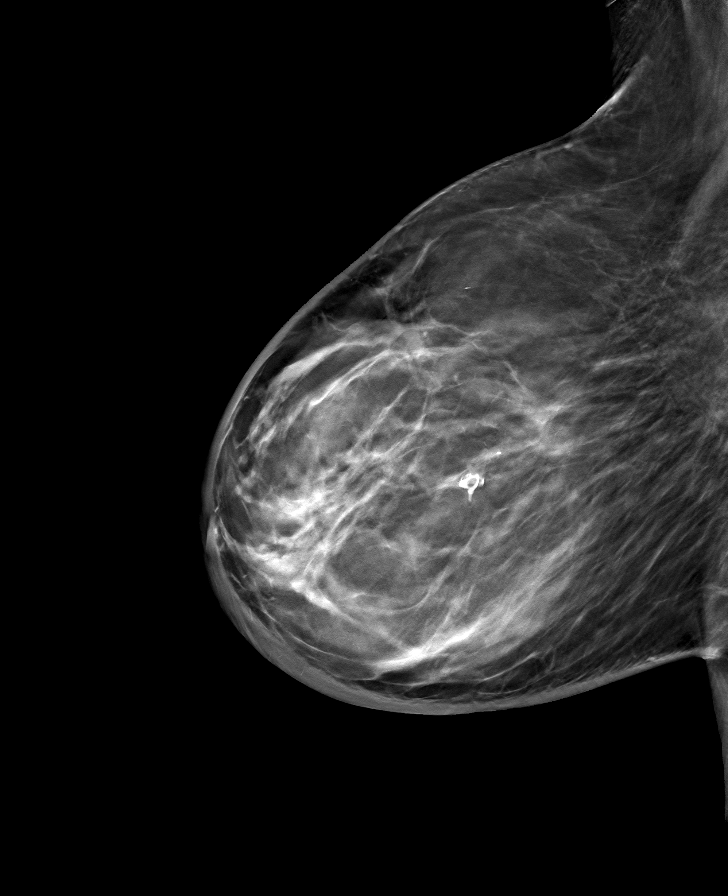

[R CC tomo · tomo slice 37/73.0]
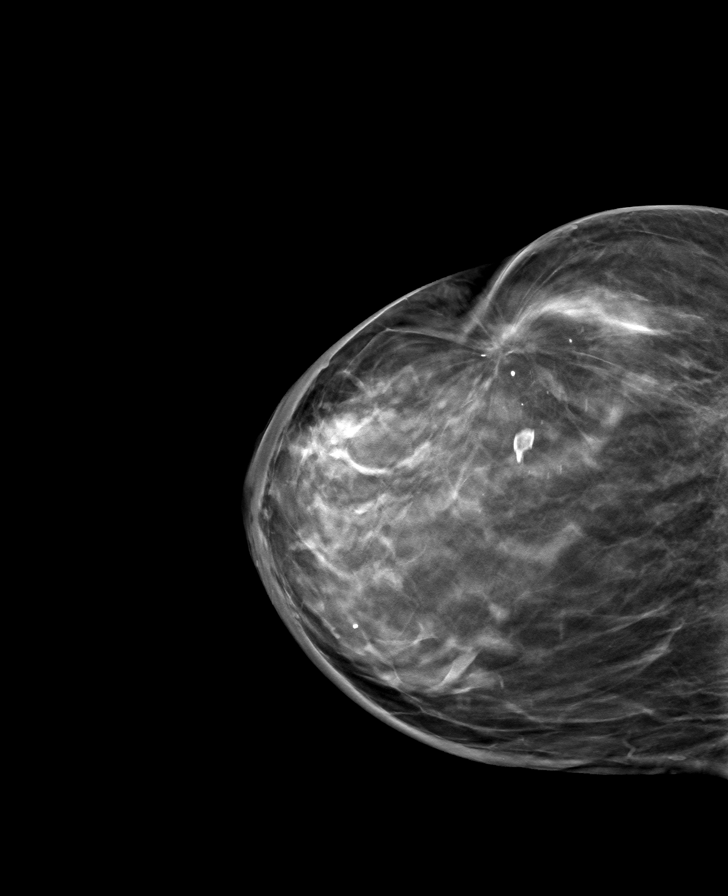

[8 of 24 positions shown; findings below may reference images not displayed]

ACR Breast Density Category b: There are scattered areas of
fibroglandular density.
FINDINGS: There are no findings suspicious for malignancy. Images were
processed with CAD.
IMPRESSION: No mammographic evidence of malignancy. A result letter of this
screening mammogram will be mailed directly to the patient.

RECOMMENDATION:
Screening mammogram in one year. (Code:[TQ])

BI-RADS CATEGORY  1: Negative.

## 2020-02-04 NOTE — Progress Notes (Signed)
[place]  [phone] [phone]  ID: Maria Davies OB: 10/09/1940  MR#: [id number]  SLH#:[id number]  Patient Care Team: Valerie Roys, DO as PCP - General (Family Medicine) Guadalupe Maple, MD as PCP - Family Medicine (Family Medicine) Ronny Bacon, MD as Consulting Physician (General Surgery) Lloyd Huger, MD as Consulting Physician (Oncology)  I connected with Maria Davies on 02/08/20 at  9:30 AM EDT by video enabled telemedicine visit and verified that I am speaking with the correct person using two identifiers.   I discussed the limitations, risks, security and privacy concerns of performing an evaluation and management service by telemedicine and the availability of in-person appointments. I also discussed with the patient that there may be a patient responsible charge related to this service. The patient expressed understanding and agreed to proceed.   Other persons participating in the visit and their role in the encounter: Patient, MD.  Patient's location: Clinic. Provider's location: Home.  CHIEF COMPLAINT: Stage IIIb right colon cancer.  INTERVAL HISTORY: Patient agreed to video assisted telemedicine visit for further evaluation and consideration of cycle 6 of adjuvant FOLFOX.  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  She currently feels well and is asymptomatic.  She has no other neurologic complaints.  She denies any recent fevers or illnesses.  She has a good appetite and denies weight loss.  She has no chest pain, shortness of breath, cough, or hemoptysis. She denies any nausea, vomiting, constipation, or diarrhea.  She has no melena or hematochezia.  She has no urinary complaints.  Patient offers no further specific complaints today.  REVIEW OF SYSTEMS:   Review of Systems  Constitutional: Negative.  Negative for fever, malaise/fatigue and weight loss.    Respiratory: Negative.  Negative for cough, hemoptysis and shortness of breath.   Cardiovascular: Negative.  Negative for chest pain and leg swelling.  Gastrointestinal: Negative.  Negative for abdominal pain, blood in stool and melena.  Genitourinary: Negative.  Negative for hematuria.  Musculoskeletal: Negative.  Negative for back pain.  Skin: Negative.  Negative for rash.  Neurological: Negative.  Negative for dizziness, focal weakness, weakness and headaches.  Psychiatric/Behavioral: Negative.  The patient is not nervous/anxious.     As per HPI. Otherwise, a complete review of systems is negative.  PAST MEDICAL HISTORY: Past Medical History:  Diagnosis Date  . Anemia   . Arthritis   . Basal cell carcinoma    back  . Breast cancer ([place]) 2010   RT LUMPECTOMY  . Cancer ([place])    rt breast  . Chemotherapy induced nausea and vomiting   . Colon cancer ([place]) 2021   colon cancer  . Heart murmur   . Hypertension   . Hypothyroidism    currently not on meds  . Osteopenia   . Personal history of chemotherapy 2010   right breast ca  . Personal history of radiation therapy 2010   right breast ca  . Port-A-Cath in place     PAST SURGICAL HISTORY: Past Surgical History:  Procedure Laterality Date  . BACK SURGERY    . BREAST EXCISIONAL BIOPSY Right 03/2009   radiation and chemo +  . BREAST EXCISIONAL BIOPSY Bilateral    NEG  . BREAST SURGERY    . CATARACT EXTRACTION W/ INTRAOCULAR LENS  IMPLANT, BILATERAL    . CHOLECYSTECTOMY    . COLONOSCOPY WITH PROPOFOL N/A 10/19/2019   Procedure: COLONOSCOPY WITH PROPOFOL-attempted, unable  to perform poor prep;  Surgeon: Lucilla Lame, MD;  Location: [facility];  Service: Endoscopy;  Laterality: N/A;  priority 3  . COLONOSCOPY WITH PROPOFOL N/A 10/20/2019   Procedure: COLONOSCOPY WITH PROPOFOL;  Surgeon: Lucilla Lame, MD;  Location: [facility] ENDOSCOPY;  Service: Endoscopy;  Laterality: N/A;  . ESOPHAGEAL DILATION  10/19/2019   Procedure:  ESOPHAGEAL DILATION;  Surgeon: Lucilla Lame, MD;  Location: [facility];  Service: Endoscopy;;  . ESOPHAGOGASTRODUODENOSCOPY (EGD) WITH PROPOFOL N/A 10/19/2019   Procedure: ESOPHAGOGASTRODUODENOSCOPY (EGD) WITH PROPOFOL;  Surgeon: Lucilla Lame, MD;  Location: [facility];  Service: Endoscopy;  Laterality: N/A;  . PORTACATH PLACEMENT Right 11/23/2019   Procedure: INSERTION PORT-A-CATH;  Surgeon: Ronny Bacon, MD;  Location: [facility] ORS;  Service: General;  Laterality: Right;    FAMILY HISTORY: Family History  Problem Relation Age of Onset  . Cancer Mother   . Heart disease Father   . Breast cancer Paternal Aunt 60    ADVANCED DIRECTIVES (Y/N):  N  HEALTH MAINTENANCE: Social History   Tobacco Use  . Smoking status: Never Smoker  . Smokeless tobacco: Never Used  Vaping Use  . Vaping Use: Never used  Substance Use Topics  . Alcohol use: Not Currently    Alcohol/week: 0.0 standard drinks    Comment: No alcohol since 08/05/19  . Drug use: No     Colonoscopy:  PAP:  Bone density:  Lipid panel:  No Known Allergies  Current Outpatient Medications  Medication Sig Dispense Refill  . acetaminophen (TYLENOL) 325 MG tablet Take 325 mg by mouth 3 (three) times daily as needed for moderate pain or headache.    Marland Kitchen amLODipine (NORVASC) 5 MG tablet Take 1 tablet (5 mg total) by mouth daily. 90 tablet 1  . aspirin EC 81 MG tablet Take 81 mg by mouth daily.    . benazepril (LOTENSIN) 40 MG tablet Take 1 tablet (40 mg total) by mouth daily. 90 tablet 1  . Cholecalciferol (VITAMIN D3) 50 MCG (2000 UT) capsule Take 4,000 Units by mouth daily.     Marland Kitchen lidocaine-prilocaine (EMLA) cream Apply to affected area once (Patient taking differently: Apply 1 application topically daily as needed (port access). ) 30 g 3  . methimazole (TAPAZOLE) 5 MG tablet Take 5 mg by mouth daily.     . metoprolol succinate (TOPROL-XL) 50 MG 24 hr tablet TAKE 1 TABLET BY MOUTH ONCE DAILY 90 tablet 0  .  Multiple Vitamin (MULTI-VITAMINS) TABS Take 1 tablet by mouth daily.     Marland Kitchen OVER THE COUNTER MEDICATION Take 4 capsules by mouth in the morning and at bedtime. Omega 3, resveratrol, icariin, curcumin  (Relief Factor)    . Phenylephrine HCl (NEO-SYNEPHRINE NA) Place 1 spray into the nose daily as needed (congestion).    . Probiotic Product (PROBIOTIC ADVANCED PO) Take 1-2 capsules by mouth daily. When taking antibiotics pt will take 2 capsules instead of 1, normally just takes 1 per day otherwise    . prochlorperazine (COMPAZINE) 10 MG tablet Take 1 tablet (10 mg total) by mouth every 6 (six) hours as needed (Nausea or vomiting). 60 tablet 2  . silver sulfADIAZINE (SILVADENE) 1 % cream Apply 1 application topically daily. 50 g 0  . Sodium Hyaluronate, oral, (HYALURONIC ACID PO) Take 1 tablet by mouth daily.    . ondansetron (ZOFRAN) 8 MG tablet Take 1 tablet (8 mg total) by mouth 2 (two) times daily as needed for refractory nausea / vomiting. (Patient not taking: Reported  on 02/05/2020) 60 tablet 2   No current facility-administered medications for this visit.   Facility-Administered Medications Ordered in Other Visits  Medication Dose Route Frequency Provider Last Rate Last Admin  . fluorouracil (ADRUCIL) 4,450 mg in sodium chloride 0.9 % 61 mL chemo infusion  2,400 mg/m2 (Treatment Plan Recorded) Intravenous 1 day or 1 dose Lloyd Huger, MD   4,450 mg at 02/08/20 1301  . sodium chloride flush (NS) 0.9 % injection 10 mL  10 mL Intravenous PRN Lloyd Huger, MD   10 mL at 02/08/20 0856    OBJECTIVE: Vitals:   02/08/20 0918  BP: (!) 100/58  Pulse: 78  Resp: 18  Temp: (!) 97.5 F (36.4 C)  SpO2: 100%     Body mass index is 27.46 kg/m.    ECOG FS:0 - Asymptomatic  General: Well-developed, well-nourished, no acute distress. HEENT: Normocephalic. Neuro: Alert, answering all questions appropriately. Cranial nerves grossly intact. Psych: Normal affect.   LAB RESULTS:  Lab  Results  Component Value Date   NA 136 02/08/2020   K 3.6 02/08/2020   CL 107 02/08/2020   CO2 21 (L) 02/08/2020   GLUCOSE 111 (H) 02/08/2020   BUN 6 (L) 02/08/2020   CREATININE 0.51 02/08/2020   CALCIUM 8.5 (L) 02/08/2020   PROT 6.4 (L) 02/08/2020   ALBUMIN 3.3 (L) 02/08/2020   AST 33 02/08/2020   ALT 21 02/08/2020   ALKPHOS 107 02/08/2020   BILITOT 0.6 02/08/2020   GFRNONAA >60 02/08/2020   GFRAA >60 02/08/2020    Lab Results  Component Value Date   WBC 5.5 02/08/2020   NEUTROABS 2.3 02/08/2020   HGB 10.8 (L) 02/08/2020   HCT 31.5 (L) 02/08/2020   MCV 91.3 02/08/2020   PLT 134 (L) 02/08/2020     STUDIES: MM 3D SCREEN BREAST BILATERAL  Result Date: 02/03/2020 CLINICAL DATA:  Screening. EXAM: DIGITAL SCREENING BILATERAL MAMMOGRAM WITH TOMO AND CAD COMPARISON:  Previous exam(s). ACR Breast Density Category b: There are scattered areas of fibroglandular density. FINDINGS: There are no findings suspicious for malignancy. Images were processed with CAD. IMPRESSION: No mammographic evidence of malignancy. A result letter of this screening mammogram will be mailed directly to the patient. RECOMMENDATION: Screening mammogram in one year. (Code:SM-B-01Y) BI-RADS CATEGORY  1: Negative. Electronically Signed   By: Lillia Mountain M.D.   On: 02/03/2020 16:37    ASSESSMENT: Stage IIIb right colon cancer  PLAN:    1. Stage IIIb right colon cancer: Pathology results reviewed, confirming stage of disease.  CT scan results from Oct 28, 2019 reviewed independently  with no obvious evidence of metastatic disease.  Patient has now had port placement.  Proceed with cycle 6 of 12 of adjuvant FOLFOX today.  Return to clinic in 2 days for pump removal and then in 2 weeks for further evaluation and consideration of cycle 7. 2.  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  She completed 5 years of anastrozole in June 2016.   Her most recent mammogram 1 February 03, 2020 was reported as BI-RADS 1.  Repeat in August 2022.   3.  Anemia: Hemoglobin slightly improved to 10.8, monitor. 4.  Leukocytosis: Resolved. 5.  Hypokalemia: Improved with dietary changes, monitor. 6.  Thrombocytopenia: Mild.  Proceed with treatment as above.  I provided 30 minutes of face-to-face video visit time during this encounter which included chart review, counseling, and coordination of care as  documented above.   Patient expressed understanding and was in agreement with this plan. She also understands that She can call clinic at any time with any questions, concerns, or complaints.   Cancer Staging Colon cancer [facility]) Staging form: Colon and Rectum, AJCC 8th Edition - Clinical stage from 11/13/2019: Stage IIIB (cT3, cN1a, cM0) - Signed by Lloyd Huger, MD on 11/13/2019   Lloyd Huger, MD   02/08/2020 6:15 PM

## 2020-02-05 ENCOUNTER — Encounter: Payer: Self-pay | Admitting: Oncology

## 2020-02-05 NOTE — Progress Notes (Signed)
Patient called for pre assessment. She denies any pain or concerns at this time. Would like to discuss the Covid booster shot.

## 2020-02-08 ENCOUNTER — Inpatient Hospital Stay: Payer: Medicare Other

## 2020-02-08 ENCOUNTER — Other Ambulatory Visit: Payer: Self-pay

## 2020-02-08 ENCOUNTER — Ambulatory Visit: Payer: Medicare Other | Admitting: Family Medicine

## 2020-02-08 ENCOUNTER — Inpatient Hospital Stay (HOSPITAL_BASED_OUTPATIENT_CLINIC_OR_DEPARTMENT_OTHER): Payer: Medicare Other | Admitting: Oncology

## 2020-02-08 ENCOUNTER — Inpatient Hospital Stay: Payer: Medicare Other | Admitting: Oncology

## 2020-02-08 VITALS — BP 100/58 | HR 78 | Temp 97.5°F | Resp 18 | Wt 155.0 lb

## 2020-02-08 DIAGNOSIS — C182 Malignant neoplasm of ascending colon: Secondary | ICD-10-CM | POA: Diagnosis not present

## 2020-02-08 DIAGNOSIS — Z853 Personal history of malignant neoplasm of breast: Secondary | ICD-10-CM | POA: Diagnosis not present

## 2020-02-08 DIAGNOSIS — D649 Anemia, unspecified: Secondary | ICD-10-CM | POA: Diagnosis not present

## 2020-02-08 DIAGNOSIS — Z5111 Encounter for antineoplastic chemotherapy: Secondary | ICD-10-CM | POA: Diagnosis not present

## 2020-02-08 DIAGNOSIS — G629 Polyneuropathy, unspecified: Secondary | ICD-10-CM | POA: Diagnosis not present

## 2020-02-08 DIAGNOSIS — E876 Hypokalemia: Secondary | ICD-10-CM | POA: Diagnosis not present

## 2020-02-08 DIAGNOSIS — C189 Malignant neoplasm of colon, unspecified: Secondary | ICD-10-CM | POA: Diagnosis not present

## 2020-02-08 LAB — COMPREHENSIVE METABOLIC PANEL
ALT: 21 U/L (ref 0–44)
AST: 33 U/L (ref 15–41)
Albumin: 3.3 g/dL — ABNORMAL LOW (ref 3.5–5.0)
Alkaline Phosphatase: 107 U/L (ref 38–126)
Anion gap: 8 (ref 5–15)
BUN: 6 mg/dL — ABNORMAL LOW (ref 8–23)
CO2: 21 mmol/L — ABNORMAL LOW (ref 22–32)
Calcium: 8.5 mg/dL — ABNORMAL LOW (ref 8.9–10.3)
Chloride: 107 mmol/L (ref 98–111)
Creatinine, Ser: 0.51 mg/dL (ref 0.44–1.00)
GFR calc Af Amer: >60 mL/min (ref >60)
GFR calc non Af Amer: >60 mL/min (ref >60)
Glucose, Bld: 111 mg/dL — ABNORMAL HIGH (ref 70–99)
Potassium: 3.6 mmol/L (ref 3.5–5.1)
Sodium: 136 mmol/L (ref 135–145)
Total Bilirubin: 0.6 mg/dL (ref 0.3–1.2)
Total Protein: 6.4 g/dL — ABNORMAL LOW (ref 6.5–8.1)

## 2020-02-08 LAB — CBC WITH DIFFERENTIAL/PLATELET
Abs Immature Granulocytes: 0.02 10*3/uL (ref 0.00–0.07)
Basophils Absolute: 0.1 10*3/uL (ref 0.0–0.1)
Basophils Relative: 1 %
Eosinophils Absolute: 0.2 10*3/uL (ref 0.0–0.5)
Eosinophils Relative: 3 %
HCT: 31.5 % — ABNORMAL LOW (ref 36.0–46.0)
Hemoglobin: 10.8 g/dL — ABNORMAL LOW (ref 12.0–15.0)
Immature Granulocytes: 0 %
Lymphocytes Relative: 35 %
Lymphs Abs: 1.9 10*3/uL (ref 0.7–4.0)
MCH: 31.3 pg (ref 26.0–34.0)
MCHC: 34.3 g/dL (ref 30.0–36.0)
MCV: 91.3 fL (ref 80.0–100.0)
Monocytes Absolute: 1.1 10*3/uL — ABNORMAL HIGH (ref 0.1–1.0)
Monocytes Relative: 20 %
Neutro Abs: 2.3 10*3/uL (ref 1.7–7.7)
Neutrophils Relative %: 41 %
Platelets: 134 10*3/uL — ABNORMAL LOW (ref 150–400)
RBC: 3.45 MIL/uL — ABNORMAL LOW (ref 3.87–5.11)
RDW: 19.9 % — ABNORMAL HIGH (ref 11.5–15.5)
WBC: 5.5 10*3/uL (ref 4.0–10.5)
nRBC: 0 % (ref 0.0–0.2)

## 2020-02-08 MED ORDER — OXALIPLATIN CHEMO INJECTION 100 MG/20ML
80.6000 mg/m2 | Freq: Once | INTRAVENOUS | Status: AC
  Administered 2020-02-08: 150 mg via INTRAVENOUS
  Filled 2020-02-08: qty 20

## 2020-02-08 MED ORDER — DEXTROSE 5 % IV SOLN
Freq: Once | INTRAVENOUS | Status: AC
  Filled 2020-02-08: qty 250

## 2020-02-08 MED ORDER — LEUCOVORIN CALCIUM INJECTION 350 MG
403.0000 mg/m2 | Freq: Once | INTRAVENOUS | Status: AC
  Administered 2020-02-08: 750 mg via INTRAVENOUS
  Filled 2020-02-08: qty 25

## 2020-02-08 MED ORDER — SODIUM CHLORIDE 0.9% FLUSH
10.0000 mL | INTRAVENOUS | Status: DC | PRN
  Administered 2020-02-08: 10 mL via INTRAVENOUS
  Filled 2020-02-08: qty 10

## 2020-02-08 MED ORDER — PALONOSETRON HCL INJECTION 0.25 MG/5ML
0.2500 mg | Freq: Once | INTRAVENOUS | Status: AC
  Administered 2020-02-08: 0.25 mg via INTRAVENOUS
  Filled 2020-02-08: qty 5

## 2020-02-08 MED ORDER — FLUOROURACIL CHEMO INJECTION 2.5 GM/50ML
400.0000 mg/m2 | Freq: Once | INTRAVENOUS | Status: AC
  Administered 2020-02-08: 750 mg via INTRAVENOUS
  Filled 2020-02-08: qty 15

## 2020-02-08 MED ORDER — SODIUM CHLORIDE 0.9 % IV SOLN
10.0000 mg | Freq: Once | INTRAVENOUS | Status: AC
  Administered 2020-02-08: 10 mg via INTRAVENOUS
  Filled 2020-02-08: qty 10

## 2020-02-08 MED ORDER — SODIUM CHLORIDE 0.9 % IV SOLN
2400.0000 mg/m2 | INTRAVENOUS | Status: DC
  Administered 2020-02-08: 4450 mg via INTRAVENOUS
  Filled 2020-02-08: qty 89

## 2020-02-08 NOTE — Progress Notes (Signed)
Here for her chemotherapy today. No complaints.

## 2020-02-10 ENCOUNTER — Other Ambulatory Visit: Payer: Self-pay

## 2020-02-10 ENCOUNTER — Inpatient Hospital Stay: Payer: Medicare Other

## 2020-02-10 DIAGNOSIS — G629 Polyneuropathy, unspecified: Secondary | ICD-10-CM | POA: Diagnosis not present

## 2020-02-10 DIAGNOSIS — E876 Hypokalemia: Secondary | ICD-10-CM | POA: Diagnosis not present

## 2020-02-10 DIAGNOSIS — D649 Anemia, unspecified: Secondary | ICD-10-CM | POA: Diagnosis not present

## 2020-02-10 DIAGNOSIS — C182 Malignant neoplasm of ascending colon: Secondary | ICD-10-CM

## 2020-02-10 DIAGNOSIS — Z5111 Encounter for antineoplastic chemotherapy: Secondary | ICD-10-CM | POA: Diagnosis not present

## 2020-02-10 DIAGNOSIS — C189 Malignant neoplasm of colon, unspecified: Secondary | ICD-10-CM | POA: Diagnosis not present

## 2020-02-10 DIAGNOSIS — Z853 Personal history of malignant neoplasm of breast: Secondary | ICD-10-CM | POA: Diagnosis not present

## 2020-02-10 MED ORDER — HEPARIN SOD (PORK) LOCK FLUSH 100 UNIT/ML IV SOLN
500.0000 [IU] | Freq: Once | INTRAVENOUS | Status: AC | PRN
  Administered 2020-02-10: 500 [IU]
  Filled 2020-02-10: qty 5

## 2020-02-10 MED ORDER — SODIUM CHLORIDE 0.9% FLUSH
10.0000 mL | INTRAVENOUS | Status: DC | PRN
  Administered 2020-02-10: 10 mL
  Filled 2020-02-10: qty 10

## 2020-02-10 MED ORDER — HEPARIN SOD (PORK) LOCK FLUSH 100 UNIT/ML IV SOLN
INTRAVENOUS | Status: AC
  Filled 2020-02-10: qty 5

## 2020-02-11 ENCOUNTER — Encounter: Payer: Self-pay | Admitting: Family Medicine

## 2020-02-11 ENCOUNTER — Other Ambulatory Visit: Payer: Self-pay

## 2020-02-11 ENCOUNTER — Ambulatory Visit (INDEPENDENT_AMBULATORY_CARE_PROVIDER_SITE_OTHER): Payer: Medicare Other | Admitting: Family Medicine

## 2020-02-11 VITALS — BP 121/74 | HR 79 | Temp 98.9°F | Wt 154.8 lb

## 2020-02-11 DIAGNOSIS — D500 Iron deficiency anemia secondary to blood loss (chronic): Secondary | ICD-10-CM

## 2020-02-11 DIAGNOSIS — E0590 Thyrotoxicosis, unspecified without thyrotoxic crisis or storm: Secondary | ICD-10-CM | POA: Diagnosis not present

## 2020-02-11 DIAGNOSIS — C182 Malignant neoplasm of ascending colon: Secondary | ICD-10-CM

## 2020-02-11 DIAGNOSIS — I10 Essential (primary) hypertension: Secondary | ICD-10-CM

## 2020-02-11 DIAGNOSIS — E7800 Pure hypercholesterolemia, unspecified: Secondary | ICD-10-CM | POA: Diagnosis not present

## 2020-02-11 DIAGNOSIS — E039 Hypothyroidism, unspecified: Secondary | ICD-10-CM

## 2020-02-11 DIAGNOSIS — C50211 Malignant neoplasm of upper-inner quadrant of right female breast: Secondary | ICD-10-CM

## 2020-02-11 DIAGNOSIS — R7303 Prediabetes: Secondary | ICD-10-CM | POA: Diagnosis not present

## 2020-02-11 LAB — MICROALBUMIN, URINE WAIVED
Creatinine, Urine Waived: 200 mg/dL (ref 10–300)
Microalb, Ur Waived: 30 mg/L — ABNORMAL HIGH (ref 0–19)
Microalb/Creat Ratio: <30 mg/g (ref <30)

## 2020-02-11 LAB — URINALYSIS, ROUTINE W REFLEX MICROSCOPIC
Bilirubin, UA: NEGATIVE
Glucose, UA: NEGATIVE
Ketones, UA: NEGATIVE
Leukocytes,UA: NEGATIVE
Nitrite, UA: NEGATIVE
Protein,UA: NEGATIVE
RBC, UA: NEGATIVE
Specific Gravity, UA: 1.025 (ref 1.005–1.030)
Urobilinogen, Ur: 0.2 mg/dL (ref 0.2–1.0)
pH, UA: 5.5 (ref 5.0–7.5)

## 2020-02-11 LAB — BAYER DCA HB A1C WAIVED: HB A1C (BAYER DCA - WAIVED): 4.8 % (ref <7.0)

## 2020-02-11 MED ORDER — BENAZEPRIL HCL 40 MG PO TABS: 40.0000 mg | ORAL_TABLET | Freq: Every day | ORAL | 1 refills | Status: DC

## 2020-02-11 MED ORDER — AMLODIPINE BESYLATE 2.5 MG PO TABS: 2.5000 mg | ORAL_TABLET | Freq: Every day | ORAL | 1 refills | Status: DC

## 2020-02-11 MED ORDER — METOPROLOL SUCCINATE ER 50 MG PO TB24: 50.0000 mg | ORAL_TABLET | Freq: Every day | ORAL | 0 refills | Status: DC

## 2020-02-11 NOTE — Assessment & Plan Note (Signed)
Continue to follow with oncology. Call with any concerns. Continue to monitor.  

## 2020-02-11 NOTE — Assessment & Plan Note (Signed)
Rechecking labs today. Await results. Treat as needed.  °

## 2020-02-11 NOTE — Assessment & Plan Note (Signed)
Follows with Dr. Gabriel Carina. Continue to monitor. Call with any concerns. Rechecking labs today. Await results.

## 2020-02-11 NOTE — Assessment & Plan Note (Signed)
Running low. We will cut her amlodipine to 2.5 and continue to monitor. Call with any concerns. Continue to monitor.

## 2020-02-11 NOTE — Assessment & Plan Note (Signed)
Under good control on current regimen. Continue current regimen. Continue to monitor. Call with any concerns. Labs drawn today.  

## 2020-02-11 NOTE — Assessment & Plan Note (Signed)
Stable. Continue to follow with oncology. Call with any concerns. Continue to monitor.  

## 2020-02-11 NOTE — Progress Notes (Signed)
BP 121/74 (BP Location: Left Arm, Patient Position: Sitting, Cuff Size: Normal)   Pulse 79   Temp 98.9 F (37.2 C) (Oral)   Wt 154 lb 12.8 oz (70.2 kg)   SpO2 98%   BMI 27.42 kg/m    Subjective:    Patient ID: Maria Davies, female    DOB: 12/02/1940, 79 y.o.   MRN: [id number]  HPI: Maria Davies is a 79 y.o. female  Chief Complaint  Patient presents with  . Hypertension  . Hyperlipidemia  . Hypothyroidism  . Anemia   HYPERTENSION / [place] Satisfied with current treatment? yes Duration of hypertension: chronic BP monitoring frequency: not checking BP medication side effects: no Past BP meds: benazepril, amlodipine, metoprolol Duration of hyperlipidemia: chronic Cholesterol medication side effects: not on anything Cholesterol supplements: none Medication compliance: excellent compliance Aspirin: yes Recent stressors: no Recurrent headaches: no Visual changes: no Palpitations: no Dyspnea: no Chest pain: no Lower extremity edema: no Dizzy/lightheaded: yes  HYPERTHYROIDISM Thyroid control status:controlled Satisfied with current treatment? yes Medication side effects: no Medication compliance: excellent compliance Recent dose adjustment:no Fatigue: yes Cold intolerance: no Heat intolerance: no Weight gain: no Weight loss: no Constipation: no Diarrhea/loose stools: no Palpitations: no Lower extremity edema: no Anxiety/depressed mood: no  Impaired Fasting Glucose HbA1C:  Lab Results  Component Value Date   HGBA1C 5.2 08/05/2019   Duration of elevated blood sugar: chronic Polydipsia: no Polyuria: no Weight change: yes Visual disturbance: no Glucose Monitoring: no    Accucheck frequency: Not Checking Diabetic Education: Completed Family history of diabetes: yes  ANEMIA Anemia status: improved Etiology of anemia: iron deficiency Duration of anemia treatment: chronic Compliance with treatment: excellent compliance Iron supplementation  side effects: no Severity of anemia: mild Fatigue: yes Decreased exercise tolerance: yes  Dyspnea on exertion: no Palpitations: no Bleeding: no Pica: no  Relevant past medical, surgical, family and social history reviewed and updated as indicated. Interim medical history since our last visit reviewed. Allergies and medications reviewed and updated.  Review of Systems  Constitutional: Negative.   HENT: Negative.   Respiratory: Negative.   Cardiovascular: Negative.   Gastrointestinal: Negative.   Musculoskeletal: Negative.   Neurological: Negative.   Psychiatric/Behavioral: Negative.     Per HPI unless specifically indicated above     Objective:    BP 121/74 (BP Location: Left Arm, Patient Position: Sitting, Cuff Size: Normal)   Pulse 79   Temp 98.9 F (37.2 C) (Oral)   Wt 154 lb 12.8 oz (70.2 kg)   SpO2 98%   BMI 27.42 kg/m   Wt Readings from Last 3 Encounters:  02/11/20 154 lb 12.8 oz (70.2 kg)  02/08/20 155 lb (70.3 kg)  01/25/20 157 lb 8 oz (71.4 kg)    Physical Exam Vitals and nursing note reviewed.  Constitutional:      General: She is not in acute distress.    Appearance: Normal appearance. She is not ill-appearing, toxic-appearing or diaphoretic.  HENT:     Head: Normocephalic and atraumatic.     Right Ear: External ear normal.     Left Ear: External ear normal.     Nose: Nose normal.     Mouth/Throat:     Mouth: Mucous membranes are moist.     Pharynx: Oropharynx is clear.  Eyes:     General: No scleral icterus.       Right eye: No discharge.        Left eye: No discharge.     Extraocular Movements: Extraocular  movements intact.     Conjunctiva/sclera: Conjunctivae normal.     Pupils: Pupils are equal, round, and reactive to light.  Cardiovascular:     Rate and Rhythm: Normal rate and regular rhythm.     Pulses: Normal pulses.     Heart sounds: Normal heart sounds. No murmur heard.  No friction rub. No gallop.   Pulmonary:     Effort:  Pulmonary effort is normal. No respiratory distress.     Breath sounds: Normal breath sounds. No stridor. No wheezing, rhonchi or rales.  Chest:     Chest wall: No tenderness.  Musculoskeletal:        General: Normal range of motion.     Cervical back: Normal range of motion and neck supple.  Skin:    General: Skin is warm and dry.     Capillary Refill: Capillary refill takes less than 2 seconds.     Coloration: Skin is not jaundiced or pale.     Findings: No bruising, erythema, lesion or rash.  Neurological:     General: No focal deficit present.     Mental Status: She is alert and oriented to person, place, and time. Mental status is at baseline.  Psychiatric:        Mood and Affect: Mood normal.        Behavior: Behavior normal.        Thought Content: Thought content normal.        Judgment: Judgment normal.     Results for orders placed or performed in visit on 02/08/20  Comprehensive metabolic panel  Result Value Ref Range   Sodium 136 135 - 145 mmol/L   Potassium 3.6 3.5 - 5.1 mmol/L   Chloride 107 98 - 111 mmol/L   CO2 21 (L) 22 - 32 mmol/L   Glucose, Bld 111 (H) 70 - 99 mg/dL   BUN 6 (L) 8 - 23 mg/dL   Creatinine, Ser 0.51 0.44 - 1.00 mg/dL   Calcium 8.5 (L) 8.9 - 10.3 mg/dL   Total Protein 6.4 (L) 6.5 - 8.1 g/dL   Albumin 3.3 (L) 3.5 - 5.0 g/dL   AST 33 15 - 41 U/L   ALT 21 0 - 44 U/L   Alkaline Phosphatase 107 38 - 126 U/L   Total Bilirubin 0.6 0.3 - 1.2 mg/dL   GFR calc non Af Amer >60 >60 mL/min   GFR calc Af Amer >60 >60 mL/min   Anion gap 8 5 - 15  CBC with Differential  Result Value Ref Range   WBC 5.5 4.0 - 10.5 K/uL   RBC 3.45 (L) 3.87 - 5.11 MIL/uL   Hemoglobin 10.8 (L) 12.0 - 15.0 g/dL   HCT 31.5 (L) 36 - 46 %   MCV 91.3 80.0 - 100.0 fL   MCH 31.3 26.0 - 34.0 pg   MCHC 34.3 30.0 - 36.0 g/dL   RDW 19.9 (H) 11.5 - 15.5 %   Platelets 134 (L) 150 - 400 K/uL   nRBC 0.0 0.0 - 0.2 %   Neutrophils Relative % 41 %   Neutro Abs 2.3 1.7 - 7.7 K/uL    Lymphocytes Relative 35 %   Lymphs Abs 1.9 0.7 - 4.0 K/uL   Monocytes Relative 20 %   Monocytes Absolute 1.1 (H) 0 - 1 K/uL   Eosinophils Relative 3 %   Eosinophils Absolute 0.2 0 - 0 K/uL   Basophils Relative 1 %   Basophils Absolute 0.1 0 - 0 K/uL  Immature Granulocytes 0 %   Abs Immature Granulocytes 0.02 0.00 - 0.07 K/uL      Assessment & Plan:   Problem List Items Addressed This Visit      Cardiovascular and Mediastinum   Hypertension - Primary    Running low. We will cut her amlodipine to 2.5 and continue to monitor. Call with any concerns. Continue to monitor.       Relevant Medications   benazepril (LOTENSIN) 40 MG tablet   metoprolol succinate (TOPROL-XL) 50 MG 24 hr tablet   amLODipine (NORVASC) 2.5 MG tablet   Other Relevant Orders   Comprehensive metabolic panel   Microalbumin, Urine Waived   Urinalysis, Routine w reflex microscopic     Digestive   Colon cancer [facility])    Continue to follow with oncology. Call with any concerns. Continue to monitor.         Endocrine   Hyperthyroidism    Follows with Dr. Gabriel Carina. Continue to monitor. Call with any concerns. Rechecking labs today. Await results.       Relevant Medications   metoprolol succinate (TOPROL-XL) 50 MG 24 hr tablet     Other   Breast cancer in female ([place])    Stable. Continue to follow with oncology. Call with any concerns. Continue to monitor.       Hypercholesteremia    Rechecking labs today. Await results. Treat as needed.       Relevant Medications   benazepril (LOTENSIN) 40 MG tablet   metoprolol succinate (TOPROL-XL) 50 MG 24 hr tablet   amLODipine (NORVASC) 2.5 MG tablet   Other Relevant Orders   Comprehensive metabolic panel   Lipid Panel w/o Chol/HDL Ratio   Pre-diabetes    Under good control on current regimen. Continue current regimen. Continue to monitor. Call with any concerns. Labs drawn today.       Relevant Orders   Bayer DCA Hb A1c Waived   Comprehensive metabolic  panel   Iron deficiency anemia due to chronic blood loss    Rechecking labs today. Await results. Treat as needed.       Relevant Orders   CBC with Differential/Platelet   Comprehensive metabolic panel   Iron and TIBC   Ferritin       Follow up plan: Return in about 6 months (around 08/13/2020), or wellness/follow up.

## 2020-02-12 LAB — COMPREHENSIVE METABOLIC PANEL
ALT: 29 IU/L (ref 0–32)
AST: 47 IU/L — ABNORMAL HIGH (ref 0–40)
Albumin/Globulin Ratio: 1.4 (ref 1.2–2.2)
Albumin: 3.3 g/dL — ABNORMAL LOW (ref 3.7–4.7)
Alkaline Phosphatase: 121 IU/L (ref 48–121)
BUN/Creatinine Ratio: 15 (ref 12–28)
BUN: 7 mg/dL — ABNORMAL LOW (ref 8–27)
Bilirubin Total: 0.6 mg/dL (ref 0.0–1.2)
CO2: 23 mmol/L (ref 20–29)
Calcium: 8.9 mg/dL (ref 8.7–10.3)
Chloride: 104 mmol/L (ref 96–106)
Creatinine, Ser: 0.47 mg/dL — ABNORMAL LOW (ref 0.57–1.00)
GFR calc Af Amer: 110 mL/min/{1.73_m2} (ref >59)
GFR calc non Af Amer: 96 mL/min/{1.73_m2} (ref >59)
Globulin, Total: 2.4 g/dL (ref 1.5–4.5)
Glucose: 105 mg/dL — ABNORMAL HIGH (ref 65–99)
Potassium: 3.6 mmol/L (ref 3.5–5.2)
Sodium: 139 mmol/L (ref 134–144)
Total Protein: 5.7 g/dL — ABNORMAL LOW (ref 6.0–8.5)

## 2020-02-12 LAB — CBC WITH DIFFERENTIAL/PLATELET
Basophils Absolute: 0 10*3/uL (ref 0.0–0.2)
Basos: 1 %
EOS (ABSOLUTE): 0.2 10*3/uL (ref 0.0–0.4)
Eos: 5 %
Hematocrit: 34.5 % (ref 34.0–46.6)
Hemoglobin: 11.2 g/dL (ref 11.1–15.9)
Immature Grans (Abs): 0 10*3/uL (ref 0.0–0.1)
Immature Granulocytes: 0 %
Lymphocytes Absolute: 1.3 10*3/uL (ref 0.7–3.1)
Lymphs: 38 %
MCH: 30.3 pg (ref 26.6–33.0)
MCHC: 32.5 g/dL (ref 31.5–35.7)
MCV: 93 fL (ref 79–97)
Monocytes Absolute: 0.4 10*3/uL (ref 0.1–0.9)
Monocytes: 11 %
Neutrophils Absolute: 1.4 10*3/uL (ref 1.4–7.0)
Neutrophils: 45 %
Platelets: 132 10*3/uL — ABNORMAL LOW (ref 150–450)
RBC: 3.7 x10E6/uL — ABNORMAL LOW (ref 3.77–5.28)
RDW: 19.2 % — ABNORMAL HIGH (ref 11.7–15.4)
WBC: 3.3 10*3/uL — ABNORMAL LOW (ref 3.4–10.8)

## 2020-02-12 LAB — LIPID PANEL W/O CHOL/HDL RATIO
Cholesterol, Total: 161 mg/dL (ref 100–199)
HDL: 64 mg/dL (ref >39)
LDL Chol Calc (NIH): 77 mg/dL (ref 0–99)
Triglycerides: 111 mg/dL (ref 0–149)
VLDL Cholesterol Cal: 20 mg/dL (ref 5–40)

## 2020-02-12 LAB — IRON AND TIBC
Iron Saturation: >95 % (ref 15–55)
Iron: 303 ug/dL (ref 27–139)
Total Iron Binding Capacity: <320 ug/dL (ref 250–450)
UIBC: <17 ug/dL — ABNORMAL LOW (ref 118–369)

## 2020-02-12 LAB — TSH: TSH: 0.013 u[IU]/mL — ABNORMAL LOW (ref 0.450–4.500)

## 2020-02-12 LAB — FERRITIN: Ferritin: 177 ng/mL — ABNORMAL HIGH (ref 15–150)

## 2020-02-15 ENCOUNTER — Encounter: Payer: Self-pay | Admitting: Family Medicine

## 2020-02-19 ENCOUNTER — Encounter: Payer: Self-pay | Admitting: Oncology

## 2020-02-19 NOTE — Progress Notes (Signed)
Patient denies any questions or concerns at this time.  

## 2020-02-20 NOTE — Progress Notes (Signed)
[place]  Telephone:(336) [phone] Fax:(336) [phone]  ID: Maria Davies OB: March 28, 1941  MR#: [id number]  GQB#:[id number]  Patient Care Team: Valerie Roys, DO as PCP - General (Family Medicine) Guadalupe Maple, MD as PCP - Family Medicine (Family Medicine) Ronny Bacon, MD as Consulting Physician (General Surgery) Lloyd Huger, MD as Consulting Physician (Oncology)   CHIEF COMPLAINT: Stage IIIb right colon cancer.  INTERVAL HISTORY: Patient returns to clinic today for further evaluation and consideration of cycle 7 of adjuvant FOLFOX.  Other than a mild cold neuropathy, she is tolerating her treatments well.  She continues to feel well and remains otherwise asymptomatic.  She has no other neurologic complaints.  She denies any recent fevers or illnesses.  She has a good appetite and denies weight loss.  She has no chest pain, shortness of breath, cough, or hemoptysis. She denies any nausea, vomiting, constipation, or diarrhea.  She has no melena or hematochezia.  She has no urinary complaints.  Patient offers no further specific complaints today.  REVIEW OF SYSTEMS:   Review of Systems  Constitutional: Negative.  Negative for fever, malaise/fatigue and weight loss.  Respiratory: Negative.  Negative for cough, hemoptysis and shortness of breath.   Cardiovascular: Negative.  Negative for chest pain and leg swelling.  Gastrointestinal: Negative.  Negative for abdominal pain, blood in stool and melena.  Genitourinary: Negative.  Negative for hematuria.  Musculoskeletal: Negative.  Negative for back pain.  Skin: Negative.  Negative for rash.  Neurological: Negative.  Negative for dizziness, focal weakness, weakness and headaches.  Psychiatric/Behavioral: Negative.  The patient is not nervous/anxious.     As per HPI. Otherwise, a complete review of systems is negative.  PAST MEDICAL HISTORY: Past Medical History:  Diagnosis Date  . Anemia   .  Arthritis   . Basal cell carcinoma    back  . Breast cancer ([place]) 2010   RT LUMPECTOMY  . Cancer ([place])    rt breast  . Chemotherapy induced nausea and vomiting   . Colon cancer ([place]) 2021   colon cancer  . Heart murmur   . Hypertension   . Hypothyroidism    currently not on meds  . Osteopenia   . Personal history of chemotherapy 2010   right breast ca  . Personal history of radiation therapy 2010   right breast ca  . Port-A-Cath in place     PAST SURGICAL HISTORY: Past Surgical History:  Procedure Laterality Date  . BACK SURGERY    . BREAST EXCISIONAL BIOPSY Right 03/2009   radiation and chemo +  . BREAST EXCISIONAL BIOPSY Bilateral    NEG  . BREAST SURGERY    . CATARACT EXTRACTION W/ INTRAOCULAR LENS  IMPLANT, BILATERAL    . CHOLECYSTECTOMY    . COLONOSCOPY WITH PROPOFOL N/A 10/19/2019   Procedure: COLONOSCOPY WITH PROPOFOL-attempted, unable to perform poor prep;  Surgeon: Lucilla Lame, MD;  Location: [place];  Service: Endoscopy;  Laterality: N/A;  priority 3  . COLONOSCOPY WITH PROPOFOL N/A 10/20/2019   Procedure: COLONOSCOPY WITH PROPOFOL;  Surgeon: Lucilla Lame, MD;  Location: [facility] ENDOSCOPY;  Service: Endoscopy;  Laterality: N/A;  . ESOPHAGEAL DILATION  10/19/2019   Procedure: ESOPHAGEAL DILATION;  Surgeon: Lucilla Lame, MD;  Location: [place];  Service: Endoscopy;;  . ESOPHAGOGASTRODUODENOSCOPY (EGD) WITH PROPOFOL N/A 10/19/2019   Procedure: ESOPHAGOGASTRODUODENOSCOPY (EGD) WITH PROPOFOL;  Surgeon: Lucilla Lame, MD;  Location: [place];  Service: Endoscopy;  Laterality: N/A;  . PORTACATH  PLACEMENT Right 11/23/2019   Procedure: INSERTION PORT-A-CATH;  Surgeon: Ronny Bacon, MD;  Location: [facility];  Service: General;  Laterality: Right;    FAMILY HISTORY: Family History  Problem Relation Age of Onset  . Cancer Mother   . Heart disease Father   . Breast cancer Paternal Aunt 24    ADVANCED DIRECTIVES (Y/N):  N  HEALTH  MAINTENANCE: Social History   Tobacco Use  . Smoking status: Never Smoker  . Smokeless tobacco: Never Used  Vaping Use  . Vaping Use: Never used  Substance Use Topics  . Alcohol use: Yes    Alcohol/week: 2.0 standard drinks    Types: 2 Standard drinks or equivalent per week    Comment: only on weeks not having treatment  . Drug use: No     Colonoscopy:  PAP:  Bone density:  Lipid panel:  No Known Allergies  Current Outpatient Medications  Medication Sig Dispense Refill  . acetaminophen (TYLENOL) 325 MG tablet Take 325 mg by mouth 3 (three) times daily as needed for moderate pain or headache.    Marland Kitchen amLODipine (NORVASC) 2.5 MG tablet Take 1 tablet (2.5 mg total) by mouth daily. 90 tablet 1  . aspirin EC 81 MG tablet Take 81 mg by mouth daily.    . benazepril (LOTENSIN) 40 MG tablet Take 1 tablet (40 mg total) by mouth daily. 90 tablet 1  . lidocaine-prilocaine (EMLA) cream Apply to affected area once (Patient taking differently: Apply 1 application topically daily as needed (port access). ) 30 g 3  . methimazole (TAPAZOLE) 5 MG tablet Take 5 mg by mouth daily.     . metoprolol succinate (TOPROL-XL) 50 MG 24 hr tablet Take 1 tablet (50 mg total) by mouth daily. Take with or immediately following a meal. 90 tablet 0  . Multiple Vitamin (MULTI-VITAMINS) TABS Take 1 tablet by mouth daily.     . ondansetron (ZOFRAN) 8 MG tablet Take 1 tablet (8 mg total) by mouth 2 (two) times daily as needed for refractory nausea / vomiting. 60 tablet 2  . Probiotic Product (PROBIOTIC ADVANCED PO) Take 1-2 capsules by mouth daily. When taking antibiotics pt will take 2 capsules instead of 1, normally just takes 1 per day otherwise    . prochlorperazine (COMPAZINE) 10 MG tablet Take 1 tablet (10 mg total) by mouth every 6 (six) hours as needed (Nausea or vomiting). 60 tablet 2  . silver sulfADIAZINE (SILVADENE) 1 % cream Apply 1 application topically daily. 50 g 0  . Cholecalciferol (VITAMIN D3) 50 MCG  (2000 UT) capsule Take 4,000 Units by mouth daily.  (Patient not taking: Reported on 02/11/2020)    . Phenylephrine HCl (NEO-SYNEPHRINE NA) Place 1 spray into the nose daily as needed (congestion). (Patient not taking: Reported on 02/11/2020)    . Sodium Hyaluronate, oral, (HYALURONIC ACID PO) Take 1 tablet by mouth daily. (Patient not taking: Reported on 02/11/2020)     No current facility-administered medications for this visit.   Facility-Administered Medications Ordered in Other Visits  Medication Dose Route Frequency Provider Last Rate Last Admin  . fluorouracil (ADRUCIL) 4,450 mg in sodium chloride 0.9 % 61 mL chemo infusion  2,400 mg/m2 (Treatment Plan Recorded) Intravenous 1 day or 1 dose Lloyd Huger, MD      . fluorouracil (ADRUCIL) chemo injection 750 mg  400 mg/m2 (Treatment Plan Recorded) Intravenous Once Lloyd Huger, MD      . heparin lock flush 100 unit/mL  500 Units Intravenous Once  Lloyd Huger, MD      . leucovorin 750 mg in dextrose 5 % 250 mL infusion  750 mg Intravenous Once Lloyd Huger, MD      . oxaliplatin (ELOXATIN) 150 mg in dextrose 5 % 500 mL chemo infusion  150 mg Intravenous Once Lloyd Huger, MD      . potassium chloride 20 mEq in 100 mL IVPB  20 mEq Intravenous Once Lloyd Huger, MD      . sodium chloride flush (NS) 0.9 % injection 10 mL  10 mL Intravenous PRN Lloyd Huger, MD   10 mL at 02/23/20 0818    OBJECTIVE: There were no vitals filed for this visit.   There is no height or weight on file to calculate BMI.    ECOG FS:0 - Asymptomatic  General: Well-developed, well-nourished, no acute distress. Eyes: Pink conjunctiva, anicteric sclera. HEENT: Normocephalic, moist mucous membranes. Lungs: No audible wheezing or coughing. Heart: Regular rate and rhythm. Abdomen: Soft, nontender, no obvious distention. Musculoskeletal: No edema, cyanosis, or clubbing. Neuro: Alert, answering all questions appropriately.  Cranial nerves grossly intact. Skin: No rashes or petechiae noted. Psych: Normal affect.   LAB RESULTS:  Lab Results  Component Value Date   NA 137 02/23/2020   K 3.0 (L) 02/23/2020   CL 104 02/23/2020   CO2 23 02/23/2020   GLUCOSE 111 (H) 02/23/2020   BUN 7 (L) 02/23/2020   CREATININE 0.51 02/23/2020   CALCIUM 8.6 (L) 02/23/2020   PROT 6.2 (L) 02/23/2020   ALBUMIN 3.3 (L) 02/23/2020   AST 30 02/23/2020   ALT 19 02/23/2020   ALKPHOS 118 02/23/2020   BILITOT 1.0 02/23/2020   GFRNONAA >60 02/23/2020   GFRAA >60 02/23/2020    Lab Results  Component Value Date   WBC 5.7 02/23/2020   NEUTROABS 3.0 02/23/2020   HGB 11.0 (L) 02/23/2020   HCT 31.4 (L) 02/23/2020   MCV 91.8 02/23/2020   PLT 109 (L) 02/23/2020     STUDIES: MM 3D SCREEN BREAST BILATERAL  Result Date: 02/03/2020 CLINICAL DATA:  Screening. EXAM: DIGITAL SCREENING BILATERAL MAMMOGRAM WITH TOMO AND CAD COMPARISON:  Previous exam(s). ACR Breast Density Category b: There are scattered areas of fibroglandular density. FINDINGS: There are no findings suspicious for malignancy. Images were processed with CAD. IMPRESSION: No mammographic evidence of malignancy. A result letter of this screening mammogram will be mailed directly to the patient. RECOMMENDATION: Screening mammogram in one year. (Code:SM-B-01Y) BI-RADS CATEGORY  1: Negative. Electronically Signed   By: Lillia Mountain M.D.   On: 02/03/2020 16:37    ASSESSMENT: Stage IIIb right colon cancer  PLAN:    1. Stage IIIb right colon cancer: Pathology results reviewed, confirming stage of disease.  CT scan results from Oct 28, 2019 reviewed independently  with no obvious evidence of metastatic disease.  Patient has now had port placement.  Proceed with cycle 7 of 12 of adjuvant FOLFOX today.  Return to clinic in 2 days for pump removal and then in 2 weeks for further evaluation and consideration of cycle 8.   2.  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  She completed 5 years of anastrozole in June 2016.  Her most recent mammogram 1 February 03, 2020 was reported as BI-RADS 1.  Repeat in August 2022.   3.  Anemia: Chronic and unchanged.  Patient's hemoglobin is 11.0 today. 4.  Leukocytosis: Resolved.  5.  Hypokalemia: Potassium has decreased to 3.0.  Proceed with 20 mEq IV potassium today.  We also once again discussed dietary changes. 6.  Thrombocytopenia: Platelet count 109 today.  Proceed with treatment as above.    Patient expressed understanding and was in agreement with this plan. She also understands that She can call clinic at any time with any questions, concerns, or complaints.   Cancer Staging Colon cancer [facility]) Staging form: Colon and Rectum, AJCC 8th Edition - Clinical stage from 11/13/2019: Stage IIIB (cT3, cN1a, cM0) - Signed by Lloyd Huger, MD on 11/13/2019   Lloyd Huger, MD   02/23/2020 9:45 AM

## 2020-02-23 ENCOUNTER — Inpatient Hospital Stay (HOSPITAL_BASED_OUTPATIENT_CLINIC_OR_DEPARTMENT_OTHER): Payer: Medicare Other | Admitting: Oncology

## 2020-02-23 ENCOUNTER — Inpatient Hospital Stay: Payer: Medicare Other | Attending: Oncology

## 2020-02-23 ENCOUNTER — Other Ambulatory Visit: Payer: Self-pay

## 2020-02-23 ENCOUNTER — Inpatient Hospital Stay: Payer: Medicare Other

## 2020-02-23 VITALS — BP 121/67 | HR 84 | Temp 98.3°F | Resp 18 | Wt 154.2 lb

## 2020-02-23 DIAGNOSIS — Z79899 Other long term (current) drug therapy: Secondary | ICD-10-CM | POA: Insufficient documentation

## 2020-02-23 DIAGNOSIS — Z7982 Long term (current) use of aspirin: Secondary | ICD-10-CM | POA: Insufficient documentation

## 2020-02-23 DIAGNOSIS — E876 Hypokalemia: Secondary | ICD-10-CM

## 2020-02-23 DIAGNOSIS — C182 Malignant neoplasm of ascending colon: Secondary | ICD-10-CM

## 2020-02-23 DIAGNOSIS — Z85820 Personal history of malignant melanoma of skin: Secondary | ICD-10-CM | POA: Diagnosis not present

## 2020-02-23 DIAGNOSIS — D509 Iron deficiency anemia, unspecified: Secondary | ICD-10-CM | POA: Diagnosis not present

## 2020-02-23 DIAGNOSIS — Z9221 Personal history of antineoplastic chemotherapy: Secondary | ICD-10-CM | POA: Diagnosis not present

## 2020-02-23 DIAGNOSIS — Z809 Family history of malignant neoplasm, unspecified: Secondary | ICD-10-CM | POA: Insufficient documentation

## 2020-02-23 DIAGNOSIS — D696 Thrombocytopenia, unspecified: Secondary | ICD-10-CM | POA: Diagnosis not present

## 2020-02-23 DIAGNOSIS — Z803 Family history of malignant neoplasm of breast: Secondary | ICD-10-CM | POA: Insufficient documentation

## 2020-02-23 DIAGNOSIS — M8580 Other specified disorders of bone density and structure, unspecified site: Secondary | ICD-10-CM | POA: Diagnosis not present

## 2020-02-23 DIAGNOSIS — G629 Polyneuropathy, unspecified: Secondary | ICD-10-CM | POA: Diagnosis not present

## 2020-02-23 DIAGNOSIS — Z800 Family history of malignant neoplasm of digestive organs: Secondary | ICD-10-CM | POA: Diagnosis not present

## 2020-02-23 DIAGNOSIS — M129 Arthropathy, unspecified: Secondary | ICD-10-CM | POA: Diagnosis not present

## 2020-02-23 DIAGNOSIS — Z923 Personal history of irradiation: Secondary | ICD-10-CM | POA: Insufficient documentation

## 2020-02-23 DIAGNOSIS — Z5111 Encounter for antineoplastic chemotherapy: Secondary | ICD-10-CM | POA: Insufficient documentation

## 2020-02-23 LAB — COMPREHENSIVE METABOLIC PANEL
ALT: 19 U/L (ref 0–44)
AST: 30 U/L (ref 15–41)
Albumin: 3.3 g/dL — ABNORMAL LOW (ref 3.5–5.0)
Alkaline Phosphatase: 118 U/L (ref 38–126)
Anion gap: 10 (ref 5–15)
BUN: 7 mg/dL — ABNORMAL LOW (ref 8–23)
CO2: 23 mmol/L (ref 22–32)
Calcium: 8.6 mg/dL — ABNORMAL LOW (ref 8.9–10.3)
Chloride: 104 mmol/L (ref 98–111)
Creatinine, Ser: 0.51 mg/dL (ref 0.44–1.00)
GFR calc Af Amer: >60 mL/min (ref >60)
GFR calc non Af Amer: >60 mL/min (ref >60)
Glucose, Bld: 111 mg/dL — ABNORMAL HIGH (ref 70–99)
Potassium: 3 mmol/L — ABNORMAL LOW (ref 3.5–5.1)
Sodium: 137 mmol/L (ref 135–145)
Total Bilirubin: 1 mg/dL (ref 0.3–1.2)
Total Protein: 6.2 g/dL — ABNORMAL LOW (ref 6.5–8.1)

## 2020-02-23 LAB — CBC WITH DIFFERENTIAL/PLATELET
Abs Immature Granulocytes: 0.02 10*3/uL (ref 0.00–0.07)
Basophils Absolute: 0.1 10*3/uL (ref 0.0–0.1)
Basophils Relative: 1 %
Eosinophils Absolute: 0.1 10*3/uL (ref 0.0–0.5)
Eosinophils Relative: 2 %
HCT: 31.4 % — ABNORMAL LOW (ref 36.0–46.0)
Hemoglobin: 11 g/dL — ABNORMAL LOW (ref 12.0–15.0)
Immature Granulocytes: 0 %
Lymphocytes Relative: 26 %
Lymphs Abs: 1.5 10*3/uL (ref 0.7–4.0)
MCH: 32.2 pg (ref 26.0–34.0)
MCHC: 35 g/dL (ref 30.0–36.0)
MCV: 91.8 fL (ref 80.0–100.0)
Monocytes Absolute: 1 10*3/uL (ref 0.1–1.0)
Monocytes Relative: 18 %
Neutro Abs: 3 10*3/uL (ref 1.7–7.7)
Neutrophils Relative %: 53 %
Platelets: 109 10*3/uL — ABNORMAL LOW (ref 150–400)
RBC: 3.42 MIL/uL — ABNORMAL LOW (ref 3.87–5.11)
RDW: 20.1 % — ABNORMAL HIGH (ref 11.5–15.5)
WBC: 5.7 10*3/uL (ref 4.0–10.5)
nRBC: 0 % (ref 0.0–0.2)

## 2020-02-23 MED ORDER — FLUOROURACIL CHEMO INJECTION 2.5 GM/50ML
400.0000 mg/m2 | Freq: Once | INTRAVENOUS | Status: AC
  Administered 2020-02-23: 750 mg via INTRAVENOUS
  Filled 2020-02-23: qty 15

## 2020-02-23 MED ORDER — PALONOSETRON HCL INJECTION 0.25 MG/5ML
0.2500 mg | Freq: Once | INTRAVENOUS | Status: AC
  Administered 2020-02-23: 0.25 mg via INTRAVENOUS
  Filled 2020-02-23: qty 5

## 2020-02-23 MED ORDER — SODIUM CHLORIDE 0.9% FLUSH
10.0000 mL | INTRAVENOUS | Status: DC | PRN
  Administered 2020-02-23: 10 mL via INTRAVENOUS
  Filled 2020-02-23: qty 10

## 2020-02-23 MED ORDER — SODIUM CHLORIDE 0.9 % IV SOLN
10.0000 mg | Freq: Once | INTRAVENOUS | Status: AC
  Administered 2020-02-23: 10 mg via INTRAVENOUS
  Filled 2020-02-23: qty 10

## 2020-02-23 MED ORDER — OXALIPLATIN CHEMO INJECTION 100 MG/20ML
150.0000 mg | Freq: Once | INTRAVENOUS | Status: AC
  Administered 2020-02-23: 150 mg via INTRAVENOUS
  Filled 2020-02-23: qty 10

## 2020-02-23 MED ORDER — POTASSIUM CHLORIDE 20 MEQ/100ML IV SOLN: 20.0000 meq | Freq: Once | INTRAVENOUS | Status: DC

## 2020-02-23 MED ORDER — POTASSIUM CHLORIDE 2 MEQ/ML IV SOLN
Freq: Once | INTRAVENOUS | Status: AC
  Filled 2020-02-23: qty 100

## 2020-02-23 MED ORDER — HEPARIN SOD (PORK) LOCK FLUSH 100 UNIT/ML IV SOLN
500.0000 [IU] | Freq: Once | INTRAVENOUS | Status: DC
  Filled 2020-02-23: qty 5

## 2020-02-23 MED ORDER — SODIUM CHLORIDE 0.9 % IV SOLN
2400.0000 mg/m2 | INTRAVENOUS | Status: DC
  Administered 2020-02-23: 4450 mg via INTRAVENOUS
  Filled 2020-02-23: qty 89

## 2020-02-23 MED ORDER — DEXTROSE 5 % IV SOLN
Freq: Once | INTRAVENOUS | Status: AC
  Filled 2020-02-23: qty 250

## 2020-02-23 MED ORDER — LEUCOVORIN CALCIUM INJECTION 350 MG
750.0000 mg | Freq: Once | INTRAVENOUS | Status: AC
  Administered 2020-02-23: 750 mg via INTRAVENOUS
  Filled 2020-02-23: qty 37.5

## 2020-02-25 ENCOUNTER — Inpatient Hospital Stay: Payer: Medicare Other

## 2020-02-25 ENCOUNTER — Other Ambulatory Visit: Payer: Self-pay

## 2020-02-25 ENCOUNTER — Telehealth: Payer: Self-pay | Admitting: *Deleted

## 2020-02-25 DIAGNOSIS — E876 Hypokalemia: Secondary | ICD-10-CM | POA: Diagnosis not present

## 2020-02-25 DIAGNOSIS — D696 Thrombocytopenia, unspecified: Secondary | ICD-10-CM | POA: Diagnosis not present

## 2020-02-25 DIAGNOSIS — Z5111 Encounter for antineoplastic chemotherapy: Secondary | ICD-10-CM | POA: Diagnosis not present

## 2020-02-25 DIAGNOSIS — G629 Polyneuropathy, unspecified: Secondary | ICD-10-CM | POA: Diagnosis not present

## 2020-02-25 DIAGNOSIS — D509 Iron deficiency anemia, unspecified: Secondary | ICD-10-CM | POA: Diagnosis not present

## 2020-02-25 DIAGNOSIS — C182 Malignant neoplasm of ascending colon: Secondary | ICD-10-CM | POA: Diagnosis not present

## 2020-02-25 MED ORDER — HEPARIN SOD (PORK) LOCK FLUSH 100 UNIT/ML IV SOLN
500.0000 [IU] | Freq: Once | INTRAVENOUS | Status: AC | PRN
  Administered 2020-02-25: 500 [IU]
  Filled 2020-02-25: qty 5

## 2020-02-25 MED ORDER — SODIUM CHLORIDE 0.9% FLUSH
10.0000 mL | INTRAVENOUS | Status: DC | PRN
  Administered 2020-02-25: 10 mL
  Filled 2020-02-25: qty 10

## 2020-02-25 MED ORDER — HEPARIN SOD (PORK) LOCK FLUSH 100 UNIT/ML IV SOLN
INTRAVENOUS | Status: AC
  Filled 2020-02-25: qty 5

## 2020-02-25 NOTE — Telephone Encounter (Signed)
Clois Dupes, tell her we can do it a 1:00 pm.  Thank you

## 2020-02-25 NOTE — Telephone Encounter (Signed)
Notified patient of change in appointment time.

## 2020-02-25 NOTE — Telephone Encounter (Signed)
Patient called requesting that her pump disconnect appointment for today be moved to an earlier spot if possible. Her infusion is complete. Writer will call patient with new time or to let her know we were unable to accommodate request.

## 2020-02-29 DIAGNOSIS — C50011 Malignant neoplasm of nipple and areola, right female breast: Secondary | ICD-10-CM | POA: Diagnosis not present

## 2020-03-04 ENCOUNTER — Encounter: Payer: Self-pay | Admitting: Oncology

## 2020-03-04 NOTE — Progress Notes (Signed)
Patient called for pre assessment. States she is having some nausea in afternoons but takes meds to help. Also report nose bleed for the past 4 days. States happened after treatment and on left nares. Denies any other questions or concerns. States takes aspirin is not sure if this could be causing it to be worse. Denies other concerns at this time.

## 2020-03-04 NOTE — Progress Notes (Signed)
[place]  Telephone:(336) [phone] Fax:(336) [phone]  ID: Maria Davies OB: 1941-04-27  MR#: [id number]  TOI#:[id number]  Patient Care Team: Valerie Roys, DO as PCP - General (Family Medicine) Guadalupe Maple, MD as PCP - Family Medicine (Family Medicine) Ronny Bacon, MD as Consulting Physician (General Surgery) Lloyd Huger, MD as Consulting Physician (Oncology)   CHIEF COMPLAINT: Stage IIIb right colon cancer.  INTERVAL HISTORY: Patient returns to clinic today for further evaluation and consideration of cycle 8 of adjuvant FOLFOX.  She had mild nosebleed recently, but otherwise feels well.  She has no neurologic complaints.  She denies any recent fevers or illnesses.  She has a good appetite and denies weight loss.  She has no chest pain, shortness of breath, cough, or hemoptysis. She denies any nausea, vomiting, constipation, or diarrhea.  She has no melena or hematochezia.  She has no urinary complaints.  Patient offers no further specific complaints today.  REVIEW OF SYSTEMS:   Review of Systems  Constitutional: Negative.  Negative for fever, malaise/fatigue and weight loss.  Respiratory: Negative.  Negative for cough, hemoptysis and shortness of breath.   Cardiovascular: Negative.  Negative for chest pain and leg swelling.  Gastrointestinal: Negative.  Negative for abdominal pain, blood in stool and melena.  Genitourinary: Negative.  Negative for hematuria.  Musculoskeletal: Negative.  Negative for back pain.  Skin: Negative.  Negative for rash.  Neurological: Negative.  Negative for dizziness, focal weakness, weakness and headaches.  Psychiatric/Behavioral: Negative.  The patient is not nervous/anxious.     As per HPI. Otherwise, a complete review of systems is negative.  PAST MEDICAL HISTORY: Past Medical History:  Diagnosis Date  . Anemia   . Arthritis   . Basal cell carcinoma    back  . Breast cancer ([place]) 2010   RT LUMPECTOMY    . Cancer ([place])    rt breast  . Chemotherapy induced nausea and vomiting   . Colon cancer ([place]) 2021   colon cancer  . Heart murmur   . Hypertension   . Hypothyroidism    currently not on meds  . Osteopenia   . Personal history of chemotherapy 2010   right breast ca  . Personal history of radiation therapy 2010   right breast ca  . Port-A-Cath in place     PAST SURGICAL HISTORY: Past Surgical History:  Procedure Laterality Date  . BACK SURGERY    . BREAST EXCISIONAL BIOPSY Right 03/2009   radiation and chemo +  . BREAST EXCISIONAL BIOPSY Bilateral    NEG  . BREAST SURGERY    . CATARACT EXTRACTION W/ INTRAOCULAR LENS  IMPLANT, BILATERAL    . CHOLECYSTECTOMY    . COLONOSCOPY WITH PROPOFOL N/A 10/19/2019   Procedure: COLONOSCOPY WITH PROPOFOL-attempted, unable to perform poor prep;  Surgeon: Lucilla Lame, MD;  Location: [place];  Service: Endoscopy;  Laterality: N/A;  priority 3  . COLONOSCOPY WITH PROPOFOL N/A 10/20/2019   Procedure: COLONOSCOPY WITH PROPOFOL;  Surgeon: Lucilla Lame, MD;  Location: [facility] ENDOSCOPY;  Service: Endoscopy;  Laterality: N/A;  . ESOPHAGEAL DILATION  10/19/2019   Procedure: ESOPHAGEAL DILATION;  Surgeon: Lucilla Lame, MD;  Location: [place];  Service: Endoscopy;;  . ESOPHAGOGASTRODUODENOSCOPY (EGD) WITH PROPOFOL N/A 10/19/2019   Procedure: ESOPHAGOGASTRODUODENOSCOPY (EGD) WITH PROPOFOL;  Surgeon: Lucilla Lame, MD;  Location: [place];  Service: Endoscopy;  Laterality: N/A;  . PORTACATH PLACEMENT Right 11/23/2019   Procedure: INSERTION PORT-A-CATH;  Surgeon: Ronny Bacon, MD;  Location: [facility] ORS;  Service: General;  Laterality: Right;    FAMILY HISTORY: Family History  Problem Relation Age of Onset  . Cancer Mother   . Heart disease Father   . Breast cancer Paternal Aunt 80    ADVANCED DIRECTIVES (Y/N):  N  HEALTH MAINTENANCE: Social History   Tobacco Use  . Smoking status: Never Smoker  . Smokeless tobacco:  Never Used  Vaping Use  . Vaping Use: Never used  Substance Use Topics  . Alcohol use: Yes    Alcohol/week: 2.0 standard drinks    Types: 2 Standard drinks or equivalent per week    Comment: only on weeks not having treatment  . Drug use: No     Colonoscopy:  PAP:  Bone density:  Lipid panel:  No Known Allergies  Current Outpatient Medications  Medication Sig Dispense Refill  . acetaminophen (TYLENOL) 325 MG tablet Take 325 mg by mouth 3 (three) times daily as needed for moderate pain or headache.    . amLODipine (NORVASC) 2.5 MG tablet Take 1 tablet (2.5 mg total) by mouth daily. 90 tablet 1  . aspirin EC 81 MG tablet Take 81 mg by mouth daily.    . benazepril (LOTENSIN) 40 MG tablet Take 1 tablet (40 mg total) by mouth daily. 90 tablet 1  . Cholecalciferol (VITAMIN D3) 50 MCG (2000 UT) capsule Take 4,000 Units by mouth daily.     . lidocaine-prilocaine (EMLA) cream Apply to affected area once (Patient taking differently: Apply 1 application topically daily as needed (port access). ) 30 g 3  . methimazole (TAPAZOLE) 5 MG tablet Take 5 mg by mouth daily.     . metoprolol succinate (TOPROL-XL) 50 MG 24 hr tablet Take 1 tablet (50 mg total) by mouth daily. Take with or immediately following a meal. 90 tablet 0  . Multiple Vitamin (MULTI-VITAMINS) TABS Take 1 tablet by mouth daily.     . ondansetron (ZOFRAN) 8 MG tablet Take 1 tablet (8 mg total) by mouth 2 (two) times daily as needed for refractory nausea / vomiting. 60 tablet 2  . Phenylephrine HCl (NEO-SYNEPHRINE NA) Place 1 spray into the nose daily as needed (congestion).     . Probiotic Product (PROBIOTIC ADVANCED PO) Take 1-2 capsules by mouth daily. When taking antibiotics pt will take 2 capsules instead of 1, normally just takes 1 per day otherwise    . prochlorperazine (COMPAZINE) 10 MG tablet Take 1 tablet (10 mg total) by mouth every 6 (six) hours as needed (Nausea or vomiting). 60 tablet 2  . silver sulfADIAZINE (SILVADENE)  1 % cream Apply 1 application topically daily. 50 g 0  . Sodium Hyaluronate, oral, (HYALURONIC ACID PO) Take 1 tablet by mouth daily.      No current facility-administered medications for this visit.   Facility-Administered Medications Ordered in Other Visits  Medication Dose Route Frequency Provider Last Rate Last Admin  . sodium chloride flush (NS) 0.9 % injection 10 mL  10 mL Intravenous PRN Finnegan, Timothy J, MD   10 mL at 03/07/20 0836    OBJECTIVE: Vitals:   03/07/20 0852  BP: (!) 128/57  Pulse: 70  Resp: 20  Temp: 98 F (36.7 C)  SpO2: 100%     Body mass index is 27.62 kg/m.    ECOG FS:0 - Asymptomatic  General: Well-developed, well-nourished, no acute distress. Eyes: Pink conjunctiva, anicteric sclera. HEENT: Normocephalic, moist mucous membranes. Lungs: No audible wheezing or coughing. Heart: Regular rate and rhythm. Abdomen:   Soft, nontender, no obvious distention. Musculoskeletal: No edema, cyanosis, or clubbing. Neuro: Alert, answering all questions appropriately. Cranial nerves grossly intact. Skin: No rashes or petechiae noted. Psych: Normal affect.   LAB RESULTS:  Lab Results  Component Value Date   NA 140 03/07/2020   K 3.3 (L) 03/07/2020   CL 108 03/07/2020   CO2 24 03/07/2020   GLUCOSE 111 (H) 03/07/2020   BUN 7 (L) 03/07/2020   CREATININE 0.40 (L) 03/07/2020   CALCIUM 8.7 (L) 03/07/2020   PROT 6.0 (L) 03/07/2020   ALBUMIN 3.2 (L) 03/07/2020   AST 35 03/07/2020   ALT 21 03/07/2020   ALKPHOS 109 03/07/2020   BILITOT 0.9 03/07/2020   GFRNONAA >60 03/07/2020   GFRAA >60 03/07/2020    Lab Results  Component Value Date   WBC 4.2 03/07/2020   NEUTROABS 1.7 03/07/2020   HGB 10.3 (L) 03/07/2020   HCT 29.8 (L) 03/07/2020   MCV 94.0 03/07/2020   PLT 83 (L) 03/07/2020     STUDIES: No results found.  ASSESSMENT: Stage IIIb right colon cancer  PLAN:    1. Stage IIIb right colon cancer: Pathology results reviewed, confirming stage of  disease.  CT scan results from Oct 28, 2019 reviewed independently  with no obvious evidence of metastatic disease.  Patient has now had port placement.  Delay cycle 8 of 12 of adjuvant FOLFOX secondary to thrombocytopenia.  Return to clinic in 1 week for further evaluation and reconsideration of cycle 8.     2.  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  She completed 5 years of anastrozole in June 2016.  Her most recent mammogram 1 February 03, 2020 was reported as BI-RADS 1.  Repeat in August 2022.   3.  Anemia: Hemoglobin trended down slightly to 10.3.  Monitor. 4.  Leukocytosis: Resolved. 5.  Hypokalemia: Improved to 3.3.  Continue dietary changes. 6.  Thrombocytopenia: Platelet count 83 today.  Delay treatment as above. 7.  Nosebleeds: Likely secondary to thrombocytopenia, monitor.    Patient expressed understanding and was in agreement with this plan. She also understands that She can call clinic at any time with any questions, concerns, or complaints.   Cancer Staging Colon cancer [facility]) Staging form: Colon and Rectum, AJCC 8th Edition - Clinical stage from 11/13/2019: Stage IIIB (cT3, cN1a, cM0) - Signed by Lloyd Huger, MD on 11/13/2019   Lloyd Huger, MD   03/07/2020 10:31 AM

## 2020-03-07 ENCOUNTER — Inpatient Hospital Stay (HOSPITAL_BASED_OUTPATIENT_CLINIC_OR_DEPARTMENT_OTHER): Payer: Medicare Other | Admitting: Oncology

## 2020-03-07 ENCOUNTER — Other Ambulatory Visit: Payer: Self-pay

## 2020-03-07 ENCOUNTER — Inpatient Hospital Stay: Payer: Medicare Other

## 2020-03-07 ENCOUNTER — Encounter: Payer: Self-pay | Admitting: Oncology

## 2020-03-07 VITALS — BP 128/57 | HR 70 | Temp 98.0°F | Resp 20 | Ht 63.0 in | Wt 155.9 lb

## 2020-03-07 DIAGNOSIS — C182 Malignant neoplasm of ascending colon: Secondary | ICD-10-CM | POA: Diagnosis not present

## 2020-03-07 DIAGNOSIS — Z5111 Encounter for antineoplastic chemotherapy: Secondary | ICD-10-CM | POA: Diagnosis not present

## 2020-03-07 DIAGNOSIS — D509 Iron deficiency anemia, unspecified: Secondary | ICD-10-CM | POA: Diagnosis not present

## 2020-03-07 DIAGNOSIS — E876 Hypokalemia: Secondary | ICD-10-CM | POA: Diagnosis not present

## 2020-03-07 DIAGNOSIS — G629 Polyneuropathy, unspecified: Secondary | ICD-10-CM | POA: Diagnosis not present

## 2020-03-07 DIAGNOSIS — D696 Thrombocytopenia, unspecified: Secondary | ICD-10-CM | POA: Diagnosis not present

## 2020-03-07 LAB — COMPREHENSIVE METABOLIC PANEL
ALT: 21 U/L (ref 0–44)
AST: 35 U/L (ref 15–41)
Albumin: 3.2 g/dL — ABNORMAL LOW (ref 3.5–5.0)
Alkaline Phosphatase: 109 U/L (ref 38–126)
Anion gap: 8 (ref 5–15)
BUN: 7 mg/dL — ABNORMAL LOW (ref 8–23)
CO2: 24 mmol/L (ref 22–32)
Calcium: 8.7 mg/dL — ABNORMAL LOW (ref 8.9–10.3)
Chloride: 108 mmol/L (ref 98–111)
Creatinine, Ser: 0.4 mg/dL — ABNORMAL LOW (ref 0.44–1.00)
GFR calc Af Amer: >60 mL/min (ref >60)
GFR calc non Af Amer: >60 mL/min (ref >60)
Glucose, Bld: 111 mg/dL — ABNORMAL HIGH (ref 70–99)
Potassium: 3.3 mmol/L — ABNORMAL LOW (ref 3.5–5.1)
Sodium: 140 mmol/L (ref 135–145)
Total Bilirubin: 0.9 mg/dL (ref 0.3–1.2)
Total Protein: 6 g/dL — ABNORMAL LOW (ref 6.5–8.1)

## 2020-03-07 LAB — CBC WITH DIFFERENTIAL/PLATELET
Abs Immature Granulocytes: 0.02 10*3/uL (ref 0.00–0.07)
Basophils Absolute: 0.1 10*3/uL (ref 0.0–0.1)
Basophils Relative: 1 %
Eosinophils Absolute: 0.2 10*3/uL (ref 0.0–0.5)
Eosinophils Relative: 4 %
HCT: 29.8 % — ABNORMAL LOW (ref 36.0–46.0)
Hemoglobin: 10.3 g/dL — ABNORMAL LOW (ref 12.0–15.0)
Immature Granulocytes: 1 %
Lymphocytes Relative: 34 %
Lymphs Abs: 1.4 10*3/uL (ref 0.7–4.0)
MCH: 32.5 pg (ref 26.0–34.0)
MCHC: 34.6 g/dL (ref 30.0–36.0)
MCV: 94 fL (ref 80.0–100.0)
Monocytes Absolute: 0.9 10*3/uL (ref 0.1–1.0)
Monocytes Relative: 21 %
Neutro Abs: 1.7 10*3/uL (ref 1.7–7.7)
Neutrophils Relative %: 39 %
Platelets: 83 10*3/uL — ABNORMAL LOW (ref 150–400)
RBC: 3.17 MIL/uL — ABNORMAL LOW (ref 3.87–5.11)
RDW: 19.4 % — ABNORMAL HIGH (ref 11.5–15.5)
WBC: 4.2 10*3/uL (ref 4.0–10.5)
nRBC: 0 % (ref 0.0–0.2)

## 2020-03-07 MED ORDER — HEPARIN SOD (PORK) LOCK FLUSH 100 UNIT/ML IV SOLN
500.0000 [IU] | Freq: Once | INTRAVENOUS | Status: AC
  Administered 2020-03-07: 500 [IU] via INTRAVENOUS
  Filled 2020-03-07: qty 5

## 2020-03-07 MED ORDER — SODIUM CHLORIDE 0.9% FLUSH
10.0000 mL | INTRAVENOUS | Status: DC | PRN
  Administered 2020-03-07: 10 mL via INTRAVENOUS
  Filled 2020-03-07: qty 10

## 2020-03-09 ENCOUNTER — Inpatient Hospital Stay: Payer: Medicare Other

## 2020-03-11 ENCOUNTER — Encounter: Payer: Self-pay | Admitting: Oncology

## 2020-03-11 NOTE — Progress Notes (Signed)
[place]  Telephone:(336) [phone] Fax:(336) [phone]  ID: [name] OB: 01-Sep-1940  MR#: [id number]  DBZ#:[id number]  Patient Care Team: Valerie Roys, DO as PCP - General (Family Medicine) Guadalupe Maple, MD as PCP - Family Medicine (Family Medicine) Ronny Bacon, MD as Consulting Physician (General Surgery) Lloyd Huger, MD as Consulting Physician (Oncology)   CHIEF COMPLAINT: Stage IIIb right colon cancer.  INTERVAL HISTORY: Patient returns to clinic today for further evaluation and reconsideration of cycle 8 of adjuvant FOLFOX.  She currently feels well and is asymptomatic.  She is tolerating her treatments without significant side effects.  She has no neurologic complaints.  She denies any recent fevers or illnesses.  She has a good appetite and denies weight loss.  She has no chest pain, shortness of breath, cough, or hemoptysis. She denies any nausea, vomiting, constipation, or diarrhea.  She has no melena or hematochezia.  She has no urinary complaints.  Patient offers no specific complaints today.  REVIEW OF SYSTEMS:   Review of Systems  Constitutional: Negative.  Negative for fever, malaise/fatigue and weight loss.  Respiratory: Negative.  Negative for cough, hemoptysis and shortness of breath.   Cardiovascular: Negative.  Negative for chest pain and leg swelling.  Gastrointestinal: Negative.  Negative for abdominal pain, blood in stool and melena.  Genitourinary: Negative.  Negative for hematuria.  Musculoskeletal: Negative.  Negative for back pain.  Skin: Negative.  Negative for rash.  Neurological: Negative.  Negative for dizziness, focal weakness, weakness and headaches.  Psychiatric/Behavioral: Negative.  The patient is not nervous/anxious.     As per HPI. Otherwise, a complete review of systems is negative.  PAST MEDICAL HISTORY: Past Medical History:  Diagnosis Date  . Anemia   . Arthritis   . Basal cell carcinoma     back  . Breast cancer ([place]) 2010   RT LUMPECTOMY  . Cancer ([place])    rt breast  . Chemotherapy induced nausea and vomiting   . Colon cancer ([place]) 2021   colon cancer  . Heart murmur   . Hypertension   . Hypothyroidism    currently not on meds  . Osteopenia   . Personal history of chemotherapy 2010   right breast ca  . Personal history of radiation therapy 2010   right breast ca  . Port-A-Cath in place     PAST SURGICAL HISTORY: Past Surgical History:  Procedure Laterality Date  . BACK SURGERY    . BREAST EXCISIONAL BIOPSY Right 03/2009   radiation and chemo +  . BREAST EXCISIONAL BIOPSY Bilateral    NEG  . BREAST SURGERY    . CATARACT EXTRACTION W/ INTRAOCULAR LENS  IMPLANT, BILATERAL    . CHOLECYSTECTOMY    . COLONOSCOPY WITH PROPOFOL N/A 10/19/2019   Procedure: COLONOSCOPY WITH PROPOFOL-attempted, unable to perform poor prep;  Surgeon: Lucilla Lame, MD;  Location: [facility];  Service: Endoscopy;  Laterality: N/A;  priority 3  . COLONOSCOPY WITH PROPOFOL N/A 10/20/2019   Procedure: COLONOSCOPY WITH PROPOFOL;  Surgeon: Lucilla Lame, MD;  Location: [facility] ENDOSCOPY;  Service: Endoscopy;  Laterality: N/A;  . ESOPHAGEAL DILATION  10/19/2019   Procedure: ESOPHAGEAL DILATION;  Surgeon: Lucilla Lame, MD;  Location: [place];  Service: Endoscopy;;  . ESOPHAGOGASTRODUODENOSCOPY (EGD) WITH PROPOFOL N/A 10/19/2019   Procedure: ESOPHAGOGASTRODUODENOSCOPY (EGD) WITH PROPOFOL;  Surgeon: Lucilla Lame, MD;  Location: [place];  Service: Endoscopy;  Laterality: N/A;  . PORTACATH PLACEMENT Right 11/23/2019   Procedure: INSERTION  PORT-A-CATH;  Surgeon: Ronny Bacon, MD;  Location: [facility];  Service: General;  Laterality: Right;    FAMILY HISTORY: Family History  Problem Relation Age of Onset  . Cancer Mother   . Heart disease Father   . Breast cancer Paternal Aunt 7    ADVANCED DIRECTIVES (Y/N):  N  HEALTH MAINTENANCE: Social History   Tobacco Use  .  Smoking status: Never Smoker  . Smokeless tobacco: Never Used  Vaping Use  . Vaping Use: Never used  Substance Use Topics  . Alcohol use: Yes    Alcohol/week: 2.0 standard drinks    Types: 2 Standard drinks or equivalent per week    Comment: only on weeks not having treatment  . Drug use: No     Colonoscopy:  PAP:  Bone density:  Lipid panel:  No Known Allergies  Current Outpatient Medications  Medication Sig Dispense Refill  . acetaminophen (TYLENOL) 325 MG tablet Take 325 mg by mouth 3 (three) times daily as needed for moderate pain or headache.    Marland Kitchen amLODipine (NORVASC) 2.5 MG tablet Take 1 tablet (2.5 mg total) by mouth daily. 90 tablet 1  . aspirin EC 81 MG tablet Take 81 mg by mouth daily.    . benazepril (LOTENSIN) 40 MG tablet Take 1 tablet (40 mg total) by mouth daily. 90 tablet 1  . Cholecalciferol (VITAMIN D3) 50 MCG (2000 UT) capsule Take 4,000 Units by mouth daily.     Marland Kitchen lidocaine-prilocaine (EMLA) cream Apply to affected area once (Patient taking differently: Apply 1 application topically daily as needed (port access). ) 30 g 3  . methimazole (TAPAZOLE) 5 MG tablet Take 5 mg by mouth daily.     . metoprolol succinate (TOPROL-XL) 50 MG 24 hr tablet Take 1 tablet (50 mg total) by mouth daily. Take with or immediately following a meal. 90 tablet 0  . Multiple Vitamin (MULTI-VITAMINS) TABS Take 1 tablet by mouth daily.     . ondansetron (ZOFRAN) 8 MG tablet Take 1 tablet (8 mg total) by mouth 2 (two) times daily as needed for refractory nausea / vomiting. 60 tablet 2  . Phenylephrine HCl (NEO-SYNEPHRINE NA) Place 1 spray into the nose daily as needed (congestion).     . Probiotic Product (PROBIOTIC ADVANCED PO) Take 1-2 capsules by mouth daily. When taking antibiotics pt will take 2 capsules instead of 1, normally just takes 1 per day otherwise    . prochlorperazine (COMPAZINE) 10 MG tablet Take 1 tablet (10 mg total) by mouth every 6 (six) hours as needed (Nausea or  vomiting). 60 tablet 2  . silver sulfADIAZINE (SILVADENE) 1 % cream Apply 1 application topically daily. 50 g 0  . Sodium Hyaluronate, oral, (HYALURONIC ACID PO) Take 1 tablet by mouth daily.      No current facility-administered medications for this visit.   Facility-Administered Medications Ordered in Other Visits  Medication Dose Route Frequency Provider Last Rate Last Admin  . dexamethasone (DECADRON) 10 mg in sodium chloride 0.9 % 50 mL IVPB  10 mg Intravenous Once Lloyd Huger, MD      . dextrose 5 % solution   Intravenous Once Lloyd Huger, MD      . fluorouracil (ADRUCIL) 4,450 mg in sodium chloride 0.9 % 61 mL chemo infusion  2,400 mg/m2 (Treatment Plan Recorded) Intravenous 1 day or 1 dose Lloyd Huger, MD      . fluorouracil (ADRUCIL) chemo injection 750 mg  400 mg/m2 (Treatment Plan Recorded)  Intravenous Once Lloyd Huger, MD      . leucovorin 744 mg in dextrose 5 % 250 mL infusion  400 mg/m2 (Treatment Plan Recorded) Intravenous Once Lloyd Huger, MD      . oxaliplatin (ELOXATIN) 160 mg in dextrose 5 % 500 mL chemo infusion  85 mg/m2 (Treatment Plan Recorded) Intravenous Once Lloyd Huger, MD      . palonosetron (ALOXI) injection 0.25 mg  0.25 mg Intravenous Once Lloyd Huger, MD      . sodium chloride flush (NS) 0.9 % injection 10 mL  10 mL Intravenous PRN Lloyd Huger, MD   10 mL at 03/07/20 0836    OBJECTIVE: Vitals:   03/14/20 0903  BP: 132/74  Pulse: 79  Resp: 20  Temp: 98.3 F (36.8 C)  SpO2: 99%     Body mass index is 27.79 kg/m.    ECOG FS:0 - Asymptomatic  General: Well-developed, well-nourished, no acute distress. Eyes: Pink conjunctiva, anicteric sclera. HEENT: Normocephalic, moist mucous membranes. Lungs: No audible wheezing or coughing. Heart: Regular rate and rhythm. Abdomen: Soft, nontender, no obvious distention. Musculoskeletal: No edema, cyanosis, or clubbing. Neuro: Alert, answering all  questions appropriately. Cranial nerves grossly intact. Skin: No rashes or petechiae noted. Psych: Normal affect.   LAB RESULTS:  Lab Results  Component Value Date   NA 138 03/14/2020   K 3.5 03/14/2020   CL 106 03/14/2020   CO2 25 03/14/2020   GLUCOSE 109 (H) 03/14/2020   BUN 7 (L) 03/14/2020   CREATININE 0.56 03/14/2020   CALCIUM 8.9 03/14/2020   PROT 6.3 (L) 03/14/2020   ALBUMIN 3.4 (L) 03/14/2020   AST 36 03/14/2020   ALT 22 03/14/2020   ALKPHOS 138 (H) 03/14/2020   BILITOT 0.8 03/14/2020   GFRNONAA >60 03/14/2020   GFRAA >60 03/14/2020    Lab Results  Component Value Date   WBC 3.8 (L) 03/14/2020   NEUTROABS 1.1 (L) 03/14/2020   HGB 11.2 (L) 03/14/2020   HCT 33.2 (L) 03/14/2020   MCV 96.0 03/14/2020   PLT 103 (L) 03/14/2020     STUDIES: No results found.  ASSESSMENT: Stage IIIb right colon cancer  PLAN:    1. Stage IIIb right colon cancer: Pathology results reviewed, confirming stage of disease.  CT scan results from Oct 28, 2019 reviewed independently  with no obvious evidence of metastatic disease.  Patient has now had port placement.  Proceed with cycle 8 of 12 of adjuvant FOLFOX today.  Return to clinic in 2 days for pump removal and then in 2 weeks for further evaluation and consideration of cycle 9.       2.  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  She completed 5 years of anastrozole in June 2016.  Her most recent mammogram 1 February 03, 2020 was reported as BI-RADS 1.  Repeat in August 2022.   3.  Anemia: Hemoglobin is now trending up and is 11.2. 4.  Leukopenia: Mild, monitor. 5.  Hypokalemia: Resolved.  Continue dietary changes. 6.  Thrombocytopenia: Platelets have improved to 103.  Proceed with treatment as above. 7.  Nosebleeds: Resolved.    Patient expressed understanding and was in agreement with this plan. She also understands that She can call clinic at any  time with any questions, concerns, or complaints.   Cancer Staging Colon cancer [facility]) Staging form: Colon and Rectum, AJCC 8th Edition - Clinical stage  from 11/13/2019: Stage IIIB (cT3, cN1a, cM0) - Signed by Lloyd Huger, MD on 11/13/2019   Lloyd Huger, MD   03/14/2020 9:49 AM

## 2020-03-11 NOTE — Progress Notes (Signed)
Patient denies any concerns or questions at this time.

## 2020-03-14 ENCOUNTER — Inpatient Hospital Stay (HOSPITAL_BASED_OUTPATIENT_CLINIC_OR_DEPARTMENT_OTHER): Payer: Medicare Other | Admitting: Oncology

## 2020-03-14 ENCOUNTER — Inpatient Hospital Stay: Payer: Medicare Other

## 2020-03-14 ENCOUNTER — Encounter: Payer: Self-pay | Admitting: Oncology

## 2020-03-14 ENCOUNTER — Other Ambulatory Visit: Payer: Self-pay

## 2020-03-14 VITALS — BP 132/74 | HR 79 | Temp 98.3°F | Resp 20 | Ht 63.0 in | Wt 156.9 lb

## 2020-03-14 DIAGNOSIS — E876 Hypokalemia: Secondary | ICD-10-CM | POA: Diagnosis not present

## 2020-03-14 DIAGNOSIS — D509 Iron deficiency anemia, unspecified: Secondary | ICD-10-CM | POA: Diagnosis not present

## 2020-03-14 DIAGNOSIS — C182 Malignant neoplasm of ascending colon: Secondary | ICD-10-CM

## 2020-03-14 DIAGNOSIS — G629 Polyneuropathy, unspecified: Secondary | ICD-10-CM | POA: Diagnosis not present

## 2020-03-14 DIAGNOSIS — Z5111 Encounter for antineoplastic chemotherapy: Secondary | ICD-10-CM | POA: Diagnosis not present

## 2020-03-14 DIAGNOSIS — D696 Thrombocytopenia, unspecified: Secondary | ICD-10-CM | POA: Diagnosis not present

## 2020-03-14 LAB — CBC WITH DIFFERENTIAL/PLATELET
Abs Immature Granulocytes: 0.03 10*3/uL (ref 0.00–0.07)
Basophils Absolute: 0.1 10*3/uL (ref 0.0–0.1)
Basophils Relative: 1 %
Eosinophils Absolute: 0.1 10*3/uL (ref 0.0–0.5)
Eosinophils Relative: 3 %
HCT: 33.2 % — ABNORMAL LOW (ref 36.0–46.0)
Hemoglobin: 11.2 g/dL — ABNORMAL LOW (ref 12.0–15.0)
Immature Granulocytes: 1 %
Lymphocytes Relative: 43 %
Lymphs Abs: 1.6 10*3/uL (ref 0.7–4.0)
MCH: 32.4 pg (ref 26.0–34.0)
MCHC: 33.7 g/dL (ref 30.0–36.0)
MCV: 96 fL (ref 80.0–100.0)
Monocytes Absolute: 0.9 10*3/uL (ref 0.1–1.0)
Monocytes Relative: 24 %
Neutro Abs: 1.1 10*3/uL — ABNORMAL LOW (ref 1.7–7.7)
Neutrophils Relative %: 28 %
Platelets: 103 10*3/uL — ABNORMAL LOW (ref 150–400)
RBC: 3.46 MIL/uL — ABNORMAL LOW (ref 3.87–5.11)
RDW: 18.5 % — ABNORMAL HIGH (ref 11.5–15.5)
WBC: 3.8 10*3/uL — ABNORMAL LOW (ref 4.0–10.5)
nRBC: 0 % (ref 0.0–0.2)

## 2020-03-14 LAB — COMPREHENSIVE METABOLIC PANEL
ALT: 22 U/L (ref 0–44)
AST: 36 U/L (ref 15–41)
Albumin: 3.4 g/dL — ABNORMAL LOW (ref 3.5–5.0)
Alkaline Phosphatase: 138 U/L — ABNORMAL HIGH (ref 38–126)
Anion gap: 7 (ref 5–15)
BUN: 7 mg/dL — ABNORMAL LOW (ref 8–23)
CO2: 25 mmol/L (ref 22–32)
Calcium: 8.9 mg/dL (ref 8.9–10.3)
Chloride: 106 mmol/L (ref 98–111)
Creatinine, Ser: 0.56 mg/dL (ref 0.44–1.00)
GFR calc Af Amer: >60 mL/min (ref >60)
GFR calc non Af Amer: >60 mL/min (ref >60)
Glucose, Bld: 109 mg/dL — ABNORMAL HIGH (ref 70–99)
Potassium: 3.5 mmol/L (ref 3.5–5.1)
Sodium: 138 mmol/L (ref 135–145)
Total Bilirubin: 0.8 mg/dL (ref 0.3–1.2)
Total Protein: 6.3 g/dL — ABNORMAL LOW (ref 6.5–8.1)

## 2020-03-14 MED ORDER — SODIUM CHLORIDE 0.9% FLUSH
10.0000 mL | Freq: Once | INTRAVENOUS | Status: AC
  Administered 2020-03-14: 10 mL via INTRAVENOUS
  Filled 2020-03-14: qty 10

## 2020-03-14 MED ORDER — SODIUM CHLORIDE 0.9 % IV SOLN
2400.0000 mg/m2 | INTRAVENOUS | Status: DC
  Administered 2020-03-14: 4450 mg via INTRAVENOUS
  Filled 2020-03-14: qty 89

## 2020-03-14 MED ORDER — LEUCOVORIN CALCIUM INJECTION 350 MG
403.0000 mg/m2 | Freq: Once | INTRAVENOUS | Status: AC
  Administered 2020-03-14: 750 mg via INTRAVENOUS
  Filled 2020-03-14: qty 37.5

## 2020-03-14 MED ORDER — OXALIPLATIN CHEMO INJECTION 100 MG/20ML
80.0000 mg/m2 | Freq: Once | INTRAVENOUS | Status: AC
  Administered 2020-03-14: 150 mg via INTRAVENOUS
  Filled 2020-03-14: qty 20

## 2020-03-14 MED ORDER — FLUOROURACIL CHEMO INJECTION 2.5 GM/50ML
400.0000 mg/m2 | Freq: Once | INTRAVENOUS | Status: AC
  Administered 2020-03-14: 750 mg via INTRAVENOUS
  Filled 2020-03-14: qty 15

## 2020-03-14 MED ORDER — DEXTROSE 5 % IV SOLN
Freq: Once | INTRAVENOUS | Status: AC
  Filled 2020-03-14: qty 250

## 2020-03-14 MED ORDER — SODIUM CHLORIDE 0.9 % IV SOLN
10.0000 mg | Freq: Once | INTRAVENOUS | Status: AC
  Administered 2020-03-14: 10 mg via INTRAVENOUS
  Filled 2020-03-14: qty 10

## 2020-03-14 MED ORDER — PALONOSETRON HCL INJECTION 0.25 MG/5ML
0.2500 mg | Freq: Once | INTRAVENOUS | Status: AC
  Administered 2020-03-14: 0.25 mg via INTRAVENOUS
  Filled 2020-03-14: qty 5

## 2020-03-16 ENCOUNTER — Inpatient Hospital Stay: Payer: Medicare Other

## 2020-03-16 ENCOUNTER — Other Ambulatory Visit: Payer: Self-pay

## 2020-03-16 DIAGNOSIS — C182 Malignant neoplasm of ascending colon: Secondary | ICD-10-CM | POA: Diagnosis not present

## 2020-03-16 DIAGNOSIS — G629 Polyneuropathy, unspecified: Secondary | ICD-10-CM | POA: Diagnosis not present

## 2020-03-16 DIAGNOSIS — D696 Thrombocytopenia, unspecified: Secondary | ICD-10-CM | POA: Diagnosis not present

## 2020-03-16 DIAGNOSIS — E876 Hypokalemia: Secondary | ICD-10-CM | POA: Diagnosis not present

## 2020-03-16 DIAGNOSIS — D509 Iron deficiency anemia, unspecified: Secondary | ICD-10-CM | POA: Diagnosis not present

## 2020-03-16 DIAGNOSIS — Z5111 Encounter for antineoplastic chemotherapy: Secondary | ICD-10-CM | POA: Diagnosis not present

## 2020-03-16 MED ORDER — HEPARIN SOD (PORK) LOCK FLUSH 100 UNIT/ML IV SOLN
500.0000 [IU] | Freq: Once | INTRAVENOUS | Status: AC | PRN
  Administered 2020-03-16: 500 [IU]
  Filled 2020-03-16: qty 5

## 2020-03-16 MED ORDER — SODIUM CHLORIDE 0.9% FLUSH
10.0000 mL | INTRAVENOUS | Status: DC | PRN
  Administered 2020-03-16: 10 mL
  Filled 2020-03-16: qty 10

## 2020-03-16 MED ORDER — HEPARIN SOD (PORK) LOCK FLUSH 100 UNIT/ML IV SOLN
INTRAVENOUS | Status: AC
  Filled 2020-03-16: qty 5

## 2020-03-28 ENCOUNTER — Inpatient Hospital Stay: Payer: Medicare Other

## 2020-03-28 ENCOUNTER — Encounter: Payer: Self-pay | Admitting: Nurse Practitioner

## 2020-03-28 ENCOUNTER — Other Ambulatory Visit: Payer: Self-pay

## 2020-03-28 ENCOUNTER — Inpatient Hospital Stay: Payer: Medicare Other | Attending: Nurse Practitioner | Admitting: Nurse Practitioner

## 2020-03-28 VITALS — BP 99/50 | HR 83 | Temp 98.0°F | Resp 20 | Wt 153.4 lb

## 2020-03-28 DIAGNOSIS — E876 Hypokalemia: Secondary | ICD-10-CM | POA: Insufficient documentation

## 2020-03-28 DIAGNOSIS — M8580 Other specified disorders of bone density and structure, unspecified site: Secondary | ICD-10-CM | POA: Insufficient documentation

## 2020-03-28 DIAGNOSIS — C186 Malignant neoplasm of descending colon: Secondary | ICD-10-CM | POA: Diagnosis not present

## 2020-03-28 DIAGNOSIS — Z85828 Personal history of other malignant neoplasm of skin: Secondary | ICD-10-CM | POA: Diagnosis not present

## 2020-03-28 DIAGNOSIS — Z853 Personal history of malignant neoplasm of breast: Secondary | ICD-10-CM | POA: Diagnosis not present

## 2020-03-28 DIAGNOSIS — C182 Malignant neoplasm of ascending colon: Secondary | ICD-10-CM

## 2020-03-28 DIAGNOSIS — Z79899 Other long term (current) drug therapy: Secondary | ICD-10-CM | POA: Insufficient documentation

## 2020-03-28 DIAGNOSIS — Z800 Family history of malignant neoplasm of digestive organs: Secondary | ICD-10-CM | POA: Diagnosis not present

## 2020-03-28 DIAGNOSIS — D649 Anemia, unspecified: Secondary | ICD-10-CM | POA: Diagnosis not present

## 2020-03-28 DIAGNOSIS — R011 Cardiac murmur, unspecified: Secondary | ICD-10-CM | POA: Insufficient documentation

## 2020-03-28 DIAGNOSIS — Z5111 Encounter for antineoplastic chemotherapy: Secondary | ICD-10-CM | POA: Diagnosis not present

## 2020-03-28 DIAGNOSIS — Z9221 Personal history of antineoplastic chemotherapy: Secondary | ICD-10-CM | POA: Diagnosis not present

## 2020-03-28 DIAGNOSIS — I10 Essential (primary) hypertension: Secondary | ICD-10-CM | POA: Diagnosis not present

## 2020-03-28 DIAGNOSIS — D696 Thrombocytopenia, unspecified: Secondary | ICD-10-CM | POA: Diagnosis not present

## 2020-03-28 DIAGNOSIS — Z923 Personal history of irradiation: Secondary | ICD-10-CM | POA: Diagnosis not present

## 2020-03-28 LAB — COMPREHENSIVE METABOLIC PANEL WITH GFR
ALT: 21 U/L (ref 0–44)
AST: 38 U/L (ref 15–41)
Albumin: 3.3 g/dL — ABNORMAL LOW (ref 3.5–5.0)
Alkaline Phosphatase: 132 U/L — ABNORMAL HIGH (ref 38–126)
Anion gap: 10 (ref 5–15)
BUN: 7 mg/dL — ABNORMAL LOW (ref 8–23)
CO2: 23 mmol/L (ref 22–32)
Calcium: 8.8 mg/dL — ABNORMAL LOW (ref 8.9–10.3)
Chloride: 107 mmol/L (ref 98–111)
Creatinine, Ser: 0.53 mg/dL (ref 0.44–1.00)
GFR, Estimated: >60 mL/min
Glucose, Bld: 127 mg/dL — ABNORMAL HIGH (ref 70–99)
Potassium: 3.5 mmol/L (ref 3.5–5.1)
Sodium: 140 mmol/L (ref 135–145)
Total Bilirubin: 0.6 mg/dL (ref 0.3–1.2)
Total Protein: 5.9 g/dL — ABNORMAL LOW (ref 6.5–8.1)

## 2020-03-28 LAB — CBC WITH DIFFERENTIAL/PLATELET
Abs Immature Granulocytes: 0 10*3/uL (ref 0.00–0.07)
Basophils Absolute: 0 10*3/uL (ref 0.0–0.1)
Basophils Relative: 1 %
Eosinophils Absolute: 0.1 10*3/uL (ref 0.0–0.5)
Eosinophils Relative: 3 %
HCT: 30.8 % — ABNORMAL LOW (ref 36.0–46.0)
Hemoglobin: 10.6 g/dL — ABNORMAL LOW (ref 12.0–15.0)
Immature Granulocytes: 0 %
Lymphocytes Relative: 34 %
Lymphs Abs: 1.2 10*3/uL (ref 0.7–4.0)
MCH: 33.3 pg (ref 26.0–34.0)
MCHC: 34.4 g/dL (ref 30.0–36.0)
MCV: 96.9 fL (ref 80.0–100.0)
Monocytes Absolute: 0.7 10*3/uL (ref 0.1–1.0)
Monocytes Relative: 20 %
Neutro Abs: 1.5 10*3/uL — ABNORMAL LOW (ref 1.7–7.7)
Neutrophils Relative %: 42 %
Platelets: 86 10*3/uL — ABNORMAL LOW (ref 150–400)
RBC: 3.18 MIL/uL — ABNORMAL LOW (ref 3.87–5.11)
RDW: 16.1 % — ABNORMAL HIGH (ref 11.5–15.5)
WBC: 3.6 10*3/uL — ABNORMAL LOW (ref 4.0–10.5)
nRBC: 0 % (ref 0.0–0.2)

## 2020-03-28 MED ORDER — HEPARIN SOD (PORK) LOCK FLUSH 100 UNIT/ML IV SOLN
500.0000 [IU] | Freq: Once | INTRAVENOUS | Status: DC
  Filled 2020-03-28: qty 5

## 2020-03-28 MED ORDER — SODIUM CHLORIDE 0.9 % IV SOLN
10.0000 mg | Freq: Once | INTRAVENOUS | Status: AC
  Administered 2020-03-28: 10 mg via INTRAVENOUS
  Filled 2020-03-28: qty 10

## 2020-03-28 MED ORDER — OXALIPLATIN CHEMO INJECTION 100 MG/20ML
150.0000 mg | Freq: Once | INTRAVENOUS | Status: AC
  Administered 2020-03-28: 150 mg via INTRAVENOUS
  Filled 2020-03-28: qty 20

## 2020-03-28 MED ORDER — PALONOSETRON HCL INJECTION 0.25 MG/5ML
0.2500 mg | Freq: Once | INTRAVENOUS | Status: AC
  Administered 2020-03-28: 0.25 mg via INTRAVENOUS
  Filled 2020-03-28: qty 5

## 2020-03-28 MED ORDER — FLUOROURACIL CHEMO INJECTION 2.5 GM/50ML
400.0000 mg/m2 | Freq: Once | INTRAVENOUS | Status: AC
  Administered 2020-03-28: 750 mg via INTRAVENOUS
  Filled 2020-03-28: qty 15

## 2020-03-28 MED ORDER — SODIUM CHLORIDE 0.9 % IV SOLN
2400.0000 mg/m2 | INTRAVENOUS | Status: DC
  Administered 2020-03-28: 4450 mg via INTRAVENOUS
  Filled 2020-03-28: qty 89

## 2020-03-28 MED ORDER — LEUCOVORIN CALCIUM INJECTION 350 MG
750.0000 mg | Freq: Once | INTRAVENOUS | Status: AC
  Administered 2020-03-28: 750 mg via INTRAVENOUS
  Filled 2020-03-28: qty 25

## 2020-03-28 MED ORDER — SODIUM CHLORIDE 0.9% FLUSH
10.0000 mL | Freq: Once | INTRAVENOUS | Status: AC
  Administered 2020-03-28: 10 mL via INTRAVENOUS
  Filled 2020-03-28: qty 10

## 2020-03-28 MED ORDER — DEXTROSE 5 % IV SOLN
Freq: Once | INTRAVENOUS | Status: AC
  Filled 2020-03-28: qty 250

## 2020-03-28 NOTE — Progress Notes (Signed)
Per Dr. Grayland Ormond patient can receive FOLFOX treatment today with a platelet count of 86,000.

## 2020-03-28 NOTE — Progress Notes (Signed)
[place]  Telephone:(336) [phone] Fax:(336) [phone]  ID: Maria Davies OB: [birth year]  MR#: [id number]  HFW#:[id number]  Patient Care Team: Valerie Roys, DO as PCP - General (Family Medicine) Guadalupe Maple, MD as PCP - Family Medicine (Family Medicine) Ronny Bacon, MD as Consulting Physician (General Surgery) Lloyd Huger, MD as Consulting Physician (Oncology)   CHIEF COMPLAINT: Stage IIIb right colon cancer  INTERVAL HISTORY: Patient returns to clinic today for further evaluation and consideration of cycle 9 of adjuvant FOLFOX chemotherapy.  She continues to feel well and is asymptomatic.  She denies significant side effects.  Appetite is good and she denies weight loss.  No interval fevers or illness.  No chest pain, shortness of breath, cough, hemoptysis.  No nausea, vomiting, constipation, or diarrhea.  No melena or hematochezia.  No urinary complaints.  No further specific complaints today.  REVIEW OF SYSTEMS:   Review of Systems  Constitutional: Negative.  Negative for fever, malaise/fatigue and weight loss.  Respiratory: Negative.  Negative for cough, hemoptysis and shortness of breath.   Cardiovascular: Negative.  Negative for chest pain and leg swelling.  Gastrointestinal: Negative.  Negative for abdominal pain, blood in stool and melena.  Genitourinary: Negative.  Negative for hematuria.  Musculoskeletal: Negative.  Negative for back pain.  Skin: Negative.  Negative for rash.  Neurological: Negative.  Negative for dizziness, focal weakness, weakness and headaches.  Psychiatric/Behavioral: Negative.  The patient is not nervous/anxious.   As per HPI. Otherwise, a complete review of systems is negative.  PAST MEDICAL HISTORY: Past Medical History:  Diagnosis Date  . Anemia   . Arthritis   . Basal cell carcinoma    back  . Breast cancer ([place]) 2010   RT LUMPECTOMY  . Cancer ([place])    rt breast  . Chemotherapy induced nausea and  vomiting   . Colon cancer ([place]) 2021   colon cancer  . Heart murmur   . Hypertension   . Hypothyroidism    currently not on meds  . Osteopenia   . Personal history of chemotherapy 2010   right breast ca  . Personal history of radiation therapy 2010   right breast ca  . Port-A-Cath in place     PAST SURGICAL HISTORY: Past Surgical History:  Procedure Laterality Date  . BACK SURGERY    . BREAST EXCISIONAL BIOPSY Right 03/2009   radiation and chemo +  . BREAST EXCISIONAL BIOPSY Bilateral    NEG  . BREAST SURGERY    . CATARACT EXTRACTION W/ INTRAOCULAR LENS  IMPLANT, BILATERAL    . CHOLECYSTECTOMY    . COLONOSCOPY WITH PROPOFOL N/A 10/19/2019   Procedure: COLONOSCOPY WITH PROPOFOL-attempted, unable to perform poor prep;  Surgeon: Lucilla Lame, MD;  Location: [place];  Service: Endoscopy;  Laterality: N/A;  priority 3  . COLONOSCOPY WITH PROPOFOL N/A 10/20/2019   Procedure: COLONOSCOPY WITH PROPOFOL;  Surgeon: Lucilla Lame, MD;  Location: [facility] ENDOSCOPY;  Service: Endoscopy;  Laterality: N/A;  . ESOPHAGEAL DILATION  10/19/2019   Procedure: ESOPHAGEAL DILATION;  Surgeon: Lucilla Lame, MD;  Location: [place];  Service: Endoscopy;;  . ESOPHAGOGASTRODUODENOSCOPY (EGD) WITH PROPOFOL N/A 10/19/2019   Procedure: ESOPHAGOGASTRODUODENOSCOPY (EGD) WITH PROPOFOL;  Surgeon: Lucilla Lame, MD;  Location: [place];  Service: Endoscopy;  Laterality: N/A;  . PORTACATH PLACEMENT Right 11/23/2019   Procedure: INSERTION PORT-A-CATH;  Surgeon: Ronny Bacon, MD;  Location: [facility];  Service: General;  Laterality: Right;    FAMILY HISTORY: Family  History  Problem Relation Age of Onset  . Cancer Mother   . Heart disease Father   . Breast cancer Paternal Aunt 56    ADVANCED DIRECTIVES (Y/N):  N  HEALTH MAINTENANCE: Social History   Tobacco Use  . Smoking status: Never Smoker  . Smokeless tobacco: Never Used  Vaping Use  . Vaping Use: Never used  Substance Use  Topics  . Alcohol use: Yes    Alcohol/week: 2.0 standard drinks    Types: 2 Standard drinks or equivalent per week    Comment: only on weeks not having treatment  . Drug use: No     Colonoscopy:  PAP:  Bone density:  Lipid panel:  No Known Allergies  Current Outpatient Medications  Medication Sig Dispense Refill  . acetaminophen (TYLENOL) 325 MG tablet Take 325 mg by mouth 3 (three) times daily as needed for moderate pain or headache.    Marland Kitchen amLODipine (NORVASC) 2.5 MG tablet Take 1 tablet (2.5 mg total) by mouth daily. 90 tablet 1  . aspirin EC 81 MG tablet Take 81 mg by mouth daily.    . benazepril (LOTENSIN) 40 MG tablet Take 1 tablet (40 mg total) by mouth daily. 90 tablet 1  . Cholecalciferol (VITAMIN D3) 50 MCG (2000 UT) capsule Take 4,000 Units by mouth daily.     Marland Kitchen lidocaine-prilocaine (EMLA) cream Apply to affected area once (Patient taking differently: Apply 1 application topically daily as needed (port access). ) 30 g 3  . methimazole (TAPAZOLE) 5 MG tablet Take 5 mg by mouth daily.     . metoprolol succinate (TOPROL-XL) 50 MG 24 hr tablet Take 1 tablet (50 mg total) by mouth daily. Take with or immediately following a meal. 90 tablet 0  . Multiple Vitamin (MULTI-VITAMINS) TABS Take 1 tablet by mouth daily.     Marland Kitchen Phenylephrine HCl (NEO-SYNEPHRINE NA) Place 1 spray into the nose daily as needed (congestion).     . Probiotic Product (PROBIOTIC ADVANCED PO) Take 1-2 capsules by mouth daily. When taking antibiotics pt will take 2 capsules instead of 1, normally just takes 1 per day otherwise    . prochlorperazine (COMPAZINE) 10 MG tablet Take 1 tablet (10 mg total) by mouth every 6 (six) hours as needed (Nausea or vomiting). 60 tablet 2  . silver sulfADIAZINE (SILVADENE) 1 % cream Apply 1 application topically daily. 50 g 0  . Sodium Hyaluronate, oral, (HYALURONIC ACID PO) Take 1 tablet by mouth daily.     . ondansetron (ZOFRAN) 8 MG tablet Take 1 tablet (8 mg total) by mouth 2  (two) times daily as needed for refractory nausea / vomiting. (Patient not taking: Reported on 03/28/2020) 60 tablet 2   No current facility-administered medications for this visit.   Facility-Administered Medications Ordered in Other Visits  Medication Dose Route Frequency Provider Last Rate Last Admin  . heparin lock flush 100 unit/mL  500 Units Intravenous Once Lloyd Huger, MD      . sodium chloride flush (NS) 0.9 % injection 10 mL  10 mL Intravenous PRN Lloyd Huger, MD   10 mL at 03/07/20 0836    OBJECTIVE: Vitals:   03/28/20 0837  BP: (!) 99/50  Pulse: 83  Resp: 20  Temp: 98 F (36.7 C)  SpO2: 100%     Body mass index is 27.17 kg/m.    ECOG FS:0 - Asymptomatic  General: Well-developed, well-nourished, no acute distress. Eyes: Pink conjunctiva, anicteric sclera. Lungs: No audible wheezing or coughing  Heart: Regular rate and rhythm.  Abdomen: Soft, nontender, nondistended.  Musculoskeletal: No edema, cyanosis, or clubbing. Neuro: Alert, answering all questions appropriately. Cranial nerves grossly intact. Skin: No rashes or petechiae noted. Psych: Normal affect.  LAB RESULTS:  Lab Results  Component Value Date   NA 140 03/28/2020   K 3.5 03/28/2020   CL 107 03/28/2020   CO2 23 03/28/2020   GLUCOSE 127 (H) 03/28/2020   BUN 7 (L) 03/28/2020   CREATININE 0.53 03/28/2020   CALCIUM 8.8 (L) 03/28/2020   PROT 5.9 (L) 03/28/2020   ALBUMIN 3.3 (L) 03/28/2020   AST 38 03/28/2020   ALT 21 03/28/2020   ALKPHOS 132 (H) 03/28/2020   BILITOT 0.6 03/28/2020   GFRNONAA >60 03/28/2020   GFRAA >60 03/14/2020    Lab Results  Component Value Date   WBC 3.6 (L) 03/28/2020   NEUTROABS 1.5 (L) 03/28/2020   HGB 10.6 (L) 03/28/2020   HCT 30.8 (L) 03/28/2020   MCV 96.9 03/28/2020   PLT 86 (L) 03/28/2020     STUDIES: No results found.  ASSESSMENT: Stage IIIb right colon cancer  PLAN:    1. Stage IIIb right colon cancer: Per Dr. Grayland Ormond, pathology  results were reviewed confirming stage of disease.  No obvious evidence of metastatic disease on CT from May 2021.  She had port placed for administration of chemotherapy.  Currently status post cycle 8 of 12 of adjuvant FOLFOX chemotherapy.  Tolerating treatment well without significant side effects.  Labs reviewed today and acceptable for continuation of treatment.  Proceed with cycle 9 of adjuvant FOLFOX today.  Return to clinic in 2 days for pump removal and then in 2 weeks for further evaluation and consideration of cycle 10.  2.  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  She completed 5 years of anastrozole in June 2016.  Her most recent mammogram 1 February 03, 2020 was reported as BI-RADS 1.  Repeat in August 2022.   3.  Anemia: Decreased but generally stable.  Hemoglobin is 10.6 today.  Likely secondary to chemotherapy.  Continue to monitor. 4.  Leukopenia: Mild, monitor. 5.  Hypokalemia: Resolved.  Continue dietary changes.  Encouraged potassium intake 6.  Thrombocytopenia: Platelets decreased to 86.  Discussed with Dr. Grayland Ormond who agrees acceptable for treatment as she has not had any signs of bleeding or abnormal bruising.    Patient expressed understanding and was in agreement with this plan. She also understands that She can call clinic at any time with any questions, concerns, or complaints.   Cancer Staging Colon cancer [facility]) Staging form: Colon and Rectum, AJCC 8th Edition - Clinical stage from 11/13/2019: Stage IIIB (cT3, cN1a, cM0) - Signed by Lloyd Huger, MD on 11/13/2019   Verlon Au, NP   03/28/2020 12:00 PM

## 2020-03-29 ENCOUNTER — Ambulatory Visit: Payer: Medicare Other

## 2020-03-29 ENCOUNTER — Ambulatory Visit: Payer: Medicare Other | Admitting: Oncology

## 2020-03-29 ENCOUNTER — Other Ambulatory Visit: Payer: Medicare Other

## 2020-03-30 ENCOUNTER — Other Ambulatory Visit: Payer: Self-pay

## 2020-03-30 ENCOUNTER — Inpatient Hospital Stay: Payer: Medicare Other

## 2020-03-30 VITALS — BP 129/85 | HR 81 | Temp 97.7°F | Resp 20

## 2020-03-30 DIAGNOSIS — D696 Thrombocytopenia, unspecified: Secondary | ICD-10-CM | POA: Diagnosis not present

## 2020-03-30 DIAGNOSIS — Z5111 Encounter for antineoplastic chemotherapy: Secondary | ICD-10-CM | POA: Diagnosis not present

## 2020-03-30 DIAGNOSIS — D649 Anemia, unspecified: Secondary | ICD-10-CM | POA: Diagnosis not present

## 2020-03-30 DIAGNOSIS — C186 Malignant neoplasm of descending colon: Secondary | ICD-10-CM | POA: Diagnosis not present

## 2020-03-30 DIAGNOSIS — C182 Malignant neoplasm of ascending colon: Secondary | ICD-10-CM

## 2020-03-30 DIAGNOSIS — E876 Hypokalemia: Secondary | ICD-10-CM | POA: Diagnosis not present

## 2020-03-30 DIAGNOSIS — R011 Cardiac murmur, unspecified: Secondary | ICD-10-CM | POA: Diagnosis not present

## 2020-03-30 MED ORDER — HEPARIN SOD (PORK) LOCK FLUSH 100 UNIT/ML IV SOLN
INTRAVENOUS | Status: AC
  Filled 2020-03-30: qty 5

## 2020-03-30 MED ORDER — SODIUM CHLORIDE 0.9% FLUSH
10.0000 mL | INTRAVENOUS | Status: DC | PRN
  Administered 2020-03-30: 10 mL
  Filled 2020-03-30: qty 10

## 2020-03-30 MED ORDER — HEPARIN SOD (PORK) LOCK FLUSH 100 UNIT/ML IV SOLN
500.0000 [IU] | Freq: Once | INTRAVENOUS | Status: AC | PRN
  Administered 2020-03-30: 500 [IU]
  Filled 2020-03-30: qty 5

## 2020-04-05 DIAGNOSIS — I10 Essential (primary) hypertension: Secondary | ICD-10-CM | POA: Diagnosis not present

## 2020-04-05 DIAGNOSIS — I350 Nonrheumatic aortic (valve) stenosis: Secondary | ICD-10-CM | POA: Diagnosis not present

## 2020-04-05 DIAGNOSIS — E7800 Pure hypercholesterolemia, unspecified: Secondary | ICD-10-CM | POA: Diagnosis not present

## 2020-04-07 NOTE — Progress Notes (Signed)
[place]  [phone] Fax:(336) [phone]  ID: Maria Davies OB: 05-22-1941  MR#: [id number]  QIO#:[id number]  Patient Care Team: Valerie Roys, DO as PCP - General (Family Medicine) Guadalupe Maple, MD as PCP - Family Medicine (Family Medicine) Ronny Bacon, MD as Consulting Physician (General Surgery) Lloyd Huger, MD as Consulting Physician (Oncology)   CHIEF COMPLAINT: Stage IIIb right colon cancer.  INTERVAL HISTORY: Patient returns to clinic today for further evaluation and consideration of cycle 10 of adjuvant FOLFOX.  She has noticed increasing weakness and fatigue, but otherwise feels well.  She otherwise is tolerating her treatment without significant side effects. She has no neurologic complaints.  She denies any recent fevers or illnesses.  She has a good appetite and denies weight loss.  She has no chest pain, shortness of breath, cough, or hemoptysis. She denies any nausea, vomiting, constipation, or diarrhea.  She has no melena or hematochezia.  She has no urinary complaints.  Patient offers no further specific complaints today.  REVIEW OF SYSTEMS:   Review of Systems  Constitutional: Positive for malaise/fatigue. Negative for fever and weight loss.  Respiratory: Negative.  Negative for cough, hemoptysis and shortness of breath.   Cardiovascular: Negative.  Negative for chest pain and leg swelling.  Gastrointestinal: Negative.  Negative for abdominal pain, blood in stool and melena.  Genitourinary: Negative.  Negative for hematuria.  Musculoskeletal: Negative.  Negative for back pain.  Skin: Negative.  Negative for rash.  Neurological: Positive for weakness. Negative for dizziness, focal weakness and headaches.  Psychiatric/Behavioral: Negative.  The patient is not nervous/anxious.     As per HPI. Otherwise, a complete review of systems is negative.  PAST MEDICAL HISTORY: Past Medical History:  Diagnosis Date  . Anemia    . Arthritis   . Basal cell carcinoma    back  . Breast cancer ([place]) 2010   RT LUMPECTOMY  . Cancer ([place])    rt breast  . Chemotherapy induced nausea and vomiting   . Colon cancer ([place]) 2021   colon cancer  . Heart murmur   . Hypertension   . Hypothyroidism    currently not on meds  . Osteopenia   . Personal history of chemotherapy 2010   right breast ca  . Personal history of radiation therapy 2010   right breast ca  . Port-A-Cath in place     PAST SURGICAL HISTORY: Past Surgical History:  Procedure Laterality Date  . BACK SURGERY    . BREAST EXCISIONAL BIOPSY Right 03/2009   radiation and chemo +  . BREAST EXCISIONAL BIOPSY Bilateral    NEG  . BREAST SURGERY    . CATARACT EXTRACTION W/ INTRAOCULAR LENS  IMPLANT, BILATERAL    . CHOLECYSTECTOMY    . COLONOSCOPY WITH PROPOFOL N/A 10/19/2019   Procedure: COLONOSCOPY WITH PROPOFOL-attempted, unable to perform poor prep;  Surgeon: Lucilla Lame, MD;  Location: [place];  Service: Endoscopy;  Laterality: N/A;  priority 3  . COLONOSCOPY WITH PROPOFOL N/A 10/20/2019   Procedure: COLONOSCOPY WITH PROPOFOL;  Surgeon: Lucilla Lame, MD;  Location[facility] ENDOSCOPY;  Service: Endoscopy;  Laterality: N/A;  . ESOPHAGEAL DILATION  10/19/2019   Procedure: ESOPHAGEAL DILATION;  Surgeon: Lucilla Lame, MD;  Location: [place];  Service: Endoscopy;;  . ESOPHAGOGASTRODUODENOSCOPY (EGD) WITH PROPOFOL N/A 10/19/2019   Procedure: ESOPHAGOGASTRODUODENOSCOPY (EGD) WITH PROPOFOL;  Surgeon: Lucilla Lame, MD;  Location: [place];  Service: Endoscopy;  Laterality: N/A;  . PORTACATH PLACEMENT Right  11/23/2019   Procedure: INSERTION PORT-A-CATH;  Surgeon: Ronny Bacon, MD;  Location: [facility];  Service: General;  Laterality: Right;    FAMILY HISTORY: Family History  Problem Relation Age of Onset  . Cancer Mother   . Heart disease Father   . Breast cancer Paternal Aunt 74    ADVANCED DIRECTIVES (Y/N):  N  HEALTH  MAINTENANCE: Social History   Tobacco Use  . Smoking status: Never Smoker  . Smokeless tobacco: Never Used  Vaping Use  . Vaping Use: Never used  Substance Use Topics  . Alcohol use: Yes    Alcohol/week: 2.0 standard drinks    Types: 2 Standard drinks or equivalent per week    Comment: only on weeks not having treatment  . Drug use: No     Colonoscopy:  PAP:  Bone density:  Lipid panel:  No Known Allergies  Current Outpatient Medications  Medication Sig Dispense Refill  . acetaminophen (TYLENOL) 325 MG tablet Take 325 mg by mouth 3 (three) times daily as needed for moderate pain or headache.    Marland Kitchen amLODipine (NORVASC) 2.5 MG tablet Take 1 tablet (2.5 mg total) by mouth daily. 90 tablet 1  . aspirin EC 81 MG tablet Take 81 mg by mouth daily.    . benazepril (LOTENSIN) 40 MG tablet Take 1 tablet (40 mg total) by mouth daily. 90 tablet 1  . Cholecalciferol (VITAMIN D3) 50 MCG (2000 UT) capsule Take 4,000 Units by mouth daily.     Marland Kitchen lidocaine-prilocaine (EMLA) cream Apply to affected area once (Patient taking differently: Apply 1 application topically daily as needed (port access). ) 30 g 3  . methimazole (TAPAZOLE) 5 MG tablet Take 5 mg by mouth daily.     . metoprolol succinate (TOPROL-XL) 50 MG 24 hr tablet Take 1 tablet (50 mg total) by mouth daily. Take with or immediately following a meal. 90 tablet 0  . Multiple Vitamin (MULTI-VITAMINS) TABS Take 1 tablet by mouth daily.     . ondansetron (ZOFRAN) 8 MG tablet Take 1 tablet (8 mg total) by mouth 2 (two) times daily as needed for refractory nausea / vomiting. 60 tablet 2  . Phenylephrine HCl (NEO-SYNEPHRINE NA) Place 1 spray into the nose daily as needed (congestion).     . Probiotic Product (PROBIOTIC ADVANCED PO) Take 1-2 capsules by mouth daily. When taking antibiotics pt will take 2 capsules instead of 1, normally just takes 1 per day otherwise    . prochlorperazine (COMPAZINE) 10 MG tablet Take 1 tablet (10 mg total) by  mouth every 6 (six) hours as needed (Nausea or vomiting). 60 tablet 2  . silver sulfADIAZINE (SILVADENE) 1 % cream Apply 1 application topically daily. 50 g 0  . Sodium Hyaluronate, oral, (HYALURONIC ACID PO) Take 1 tablet by mouth daily.      No current facility-administered medications for this visit.   Facility-Administered Medications Ordered in Other Visits  Medication Dose Route Frequency Provider Last Rate Last Admin  . fluorouracil (ADRUCIL) 4,450 mg in sodium chloride 0.9 % 61 mL chemo infusion  2,400 mg/m2 (Treatment Plan Recorded) Intravenous 1 day or 1 dose Lloyd Huger, MD   4,450 mg at 04/11/20 1250  . heparin lock flush 100 unit/mL  500 Units Intravenous Once Lloyd Huger, MD      . sodium chloride flush (NS) 0.9 % injection 10 mL  10 mL Intravenous PRN Lloyd Huger, MD   10 mL at 03/07/20 0836    OBJECTIVE:  Vitals:   04/11/20 0933  BP: 95/60  Pulse: 95  Resp: 20  Temp: 99.3 F (37.4 C)  SpO2: 100%     Body mass index is 25.69 kg/m.    ECOG FS:1 - Symptomatic but completely ambulatory  General: Well-developed, well-nourished, no acute distress. Eyes: Pink conjunctiva, anicteric sclera. HEENT: Normocephalic, moist mucous membranes. Lungs: No audible wheezing or coughing. Heart: Regular rate and rhythm. Abdomen: Soft, nontender, no obvious distention. Musculoskeletal: No edema, cyanosis, or clubbing. Neuro: Alert, answering all questions appropriately. Cranial nerves grossly intact. Skin: No rashes or petechiae noted. Psych: Normal affect.  LAB RESULTS:  Lab Results  Component Value Date   NA 135 04/11/2020   K 3.3 (L) 04/11/2020   CL 103 04/11/2020   CO2 24 04/11/2020   GLUCOSE 178 (H) 04/11/2020   BUN 9 04/11/2020   CREATININE 0.61 04/11/2020   CALCIUM 8.4 (L) 04/11/2020   PROT 6.5 04/11/2020   ALBUMIN 3.1 (L) 04/11/2020   AST 50 (H) 04/11/2020   ALT 33 04/11/2020   ALKPHOS 160 (H) 04/11/2020   BILITOT 1.2 04/11/2020   GFRNONAA  >60 04/11/2020   GFRAA >60 03/14/2020    Lab Results  Component Value Date   WBC 5.5 04/11/2020   NEUTROABS 3.7 04/11/2020   HGB 11.3 (L) 04/11/2020   HCT 32.4 (L) 04/11/2020   MCV 95.0 04/11/2020   PLT 130 (L) 04/11/2020     STUDIES: No results found.  ASSESSMENT: Stage IIIb right colon cancer  PLAN:    1. Stage IIIb right colon cancer: Pathology results reviewed, confirming stage of disease.  CT scan results from Oct 28, 2019 reviewed independently  with no obvious evidence of metastatic disease.  Patient has now had port placement.  Proceed with cycle 10 of 12 of adjuvant FOLFOX today.  Return to clinic in 2 days for pump removal and then in 2 weeks for further evaluation and consideration of cycle 11.  2.  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  She completed 5 years of anastrozole in June 2016.  Her most recent mammogram 1 February 03, 2020 was reported as BI-RADS 1.  Repeat in August 2022.   3.  Anemia: Chronic and unchanged.  Patient's hemoglobin is 11.3 today. 4.  Leukopenia: Resolved. 5.  Hypokalemia: Mild.  Continue dietary changes. 6.  Thrombocytopenia: Platelets improved to 130.  Proceed with treatment as above.   Patient expressed understanding and was in agreement with this plan. She also understands that She can call clinic at any time with any questions, concerns, or complaints.   Cancer Staging Colon cancer [facility]) Staging form: Colon and Rectum, AJCC 8th Edition - Clinical stage from 11/13/2019: Stage IIIB (cT3, cN1a, cM0) - Signed by Lloyd Huger, MD on 11/13/2019   Lloyd Huger, MD   04/11/2020 1:14 PM

## 2020-04-08 ENCOUNTER — Encounter: Payer: Self-pay | Admitting: Oncology

## 2020-04-08 NOTE — Progress Notes (Signed)
Patient called for pre assessment. She states she has been having some pain in her neck and thought it may be coming from low potassium. She said she will eat some bananas over the weekend and see if that helps. She denies other concerns or questions at this time.

## 2020-04-11 ENCOUNTER — Inpatient Hospital Stay (HOSPITAL_BASED_OUTPATIENT_CLINIC_OR_DEPARTMENT_OTHER): Payer: Medicare Other | Admitting: Oncology

## 2020-04-11 ENCOUNTER — Inpatient Hospital Stay: Payer: Medicare Other

## 2020-04-11 ENCOUNTER — Encounter: Payer: Self-pay | Admitting: Oncology

## 2020-04-11 ENCOUNTER — Other Ambulatory Visit: Payer: Self-pay

## 2020-04-11 VITALS — BP 95/60 | HR 95 | Temp 99.3°F | Resp 20 | Wt 145.0 lb

## 2020-04-11 VITALS — BP 113/62 | HR 90 | Temp 99.6°F | Resp 18 | Wt 145.0 lb

## 2020-04-11 DIAGNOSIS — C182 Malignant neoplasm of ascending colon: Secondary | ICD-10-CM

## 2020-04-11 DIAGNOSIS — R011 Cardiac murmur, unspecified: Secondary | ICD-10-CM | POA: Diagnosis not present

## 2020-04-11 DIAGNOSIS — Z5111 Encounter for antineoplastic chemotherapy: Secondary | ICD-10-CM | POA: Diagnosis not present

## 2020-04-11 DIAGNOSIS — C186 Malignant neoplasm of descending colon: Secondary | ICD-10-CM | POA: Diagnosis not present

## 2020-04-11 DIAGNOSIS — D649 Anemia, unspecified: Secondary | ICD-10-CM | POA: Diagnosis not present

## 2020-04-11 DIAGNOSIS — E876 Hypokalemia: Secondary | ICD-10-CM | POA: Diagnosis not present

## 2020-04-11 DIAGNOSIS — D696 Thrombocytopenia, unspecified: Secondary | ICD-10-CM | POA: Diagnosis not present

## 2020-04-11 LAB — CBC WITH DIFFERENTIAL/PLATELET
Abs Immature Granulocytes: 0.04 10*3/uL (ref 0.00–0.07)
Basophils Absolute: 0 10*3/uL (ref 0.0–0.1)
Basophils Relative: 1 %
Eosinophils Absolute: 0 10*3/uL (ref 0.0–0.5)
Eosinophils Relative: 0 %
HCT: 32.4 % — ABNORMAL LOW (ref 36.0–46.0)
Hemoglobin: 11.3 g/dL — ABNORMAL LOW (ref 12.0–15.0)
Immature Granulocytes: 1 %
Lymphocytes Relative: 14 %
Lymphs Abs: 0.8 10*3/uL (ref 0.7–4.0)
MCH: 33.1 pg (ref 26.0–34.0)
MCHC: 34.9 g/dL (ref 30.0–36.0)
MCV: 95 fL (ref 80.0–100.0)
Monocytes Absolute: 1 10*3/uL (ref 0.1–1.0)
Monocytes Relative: 18 %
Neutro Abs: 3.7 10*3/uL (ref 1.7–7.7)
Neutrophils Relative %: 66 %
Platelets: 130 10*3/uL — ABNORMAL LOW (ref 150–400)
RBC: 3.41 MIL/uL — ABNORMAL LOW (ref 3.87–5.11)
RDW: 15.2 % (ref 11.5–15.5)
WBC: 5.5 10*3/uL (ref 4.0–10.5)
nRBC: 0 % (ref 0.0–0.2)

## 2020-04-11 LAB — COMPREHENSIVE METABOLIC PANEL
ALT: 33 U/L (ref 0–44)
AST: 50 U/L — ABNORMAL HIGH (ref 15–41)
Albumin: 3.1 g/dL — ABNORMAL LOW (ref 3.5–5.0)
Alkaline Phosphatase: 160 U/L — ABNORMAL HIGH (ref 38–126)
Anion gap: 8 (ref 5–15)
BUN: 9 mg/dL (ref 8–23)
CO2: 24 mmol/L (ref 22–32)
Calcium: 8.4 mg/dL — ABNORMAL LOW (ref 8.9–10.3)
Chloride: 103 mmol/L (ref 98–111)
Creatinine, Ser: 0.61 mg/dL (ref 0.44–1.00)
GFR, Estimated: >60 mL/min (ref >60)
Glucose, Bld: 178 mg/dL — ABNORMAL HIGH (ref 70–99)
Potassium: 3.3 mmol/L — ABNORMAL LOW (ref 3.5–5.1)
Sodium: 135 mmol/L (ref 135–145)
Total Bilirubin: 1.2 mg/dL (ref 0.3–1.2)
Total Protein: 6.5 g/dL (ref 6.5–8.1)

## 2020-04-11 MED ORDER — SODIUM CHLORIDE 0.9% FLUSH
10.0000 mL | Freq: Once | INTRAVENOUS | Status: AC
  Administered 2020-04-11: 10 mL via INTRAVENOUS
  Filled 2020-04-11: qty 10

## 2020-04-11 MED ORDER — SODIUM CHLORIDE 0.9 % IV SOLN
10.0000 mg | Freq: Once | INTRAVENOUS | Status: AC
  Administered 2020-04-11: 10 mg via INTRAVENOUS
  Filled 2020-04-11: qty 10

## 2020-04-11 MED ORDER — DEXTROSE 5 % IV SOLN
Freq: Once | INTRAVENOUS | Status: AC
  Filled 2020-04-11: qty 250

## 2020-04-11 MED ORDER — HEPARIN SOD (PORK) LOCK FLUSH 100 UNIT/ML IV SOLN
500.0000 [IU] | Freq: Once | INTRAVENOUS | Status: DC
  Filled 2020-04-11: qty 5

## 2020-04-11 MED ORDER — LEUCOVORIN CALCIUM INJECTION 350 MG
403.0000 mg/m2 | Freq: Once | INTRAVENOUS | Status: AC
  Administered 2020-04-11: 750 mg via INTRAVENOUS
  Filled 2020-04-11: qty 25

## 2020-04-11 MED ORDER — OXALIPLATIN CHEMO INJECTION 100 MG/20ML
80.6000 mg/m2 | Freq: Once | INTRAVENOUS | Status: AC
  Administered 2020-04-11: 150 mg via INTRAVENOUS
  Filled 2020-04-11: qty 20

## 2020-04-11 MED ORDER — FLUOROURACIL CHEMO INJECTION 2.5 GM/50ML
400.0000 mg/m2 | Freq: Once | INTRAVENOUS | Status: AC
  Administered 2020-04-11: 750 mg via INTRAVENOUS
  Filled 2020-04-11: qty 15

## 2020-04-11 MED ORDER — SODIUM CHLORIDE 0.9 % IV SOLN
2400.0000 mg/m2 | INTRAVENOUS | Status: DC
  Administered 2020-04-11: 4450 mg via INTRAVENOUS
  Filled 2020-04-11: qty 89

## 2020-04-11 MED ORDER — PALONOSETRON HCL INJECTION 0.25 MG/5ML
0.2500 mg | Freq: Once | INTRAVENOUS | Status: AC
  Administered 2020-04-11: 0.25 mg via INTRAVENOUS
  Filled 2020-04-11: qty 5

## 2020-04-13 ENCOUNTER — Inpatient Hospital Stay: Payer: Medicare Other

## 2020-04-13 ENCOUNTER — Other Ambulatory Visit: Payer: Self-pay

## 2020-04-13 DIAGNOSIS — E876 Hypokalemia: Secondary | ICD-10-CM | POA: Diagnosis not present

## 2020-04-13 DIAGNOSIS — Z5111 Encounter for antineoplastic chemotherapy: Secondary | ICD-10-CM | POA: Diagnosis not present

## 2020-04-13 DIAGNOSIS — C182 Malignant neoplasm of ascending colon: Secondary | ICD-10-CM

## 2020-04-13 DIAGNOSIS — C186 Malignant neoplasm of descending colon: Secondary | ICD-10-CM | POA: Diagnosis not present

## 2020-04-13 DIAGNOSIS — D696 Thrombocytopenia, unspecified: Secondary | ICD-10-CM | POA: Diagnosis not present

## 2020-04-13 DIAGNOSIS — D649 Anemia, unspecified: Secondary | ICD-10-CM | POA: Diagnosis not present

## 2020-04-13 DIAGNOSIS — R011 Cardiac murmur, unspecified: Secondary | ICD-10-CM | POA: Diagnosis not present

## 2020-04-13 MED ORDER — SODIUM CHLORIDE 0.9% FLUSH
10.0000 mL | INTRAVENOUS | Status: DC | PRN
  Administered 2020-04-13: 10 mL
  Filled 2020-04-13: qty 10

## 2020-04-13 MED ORDER — HEPARIN SOD (PORK) LOCK FLUSH 100 UNIT/ML IV SOLN
500.0000 [IU] | Freq: Once | INTRAVENOUS | Status: AC | PRN
  Administered 2020-04-13: 500 [IU]
  Filled 2020-04-13: qty 5

## 2020-04-13 MED ORDER — HEPARIN SOD (PORK) LOCK FLUSH 100 UNIT/ML IV SOLN
INTRAVENOUS | Status: AC
  Filled 2020-04-13: qty 5

## 2020-04-18 ENCOUNTER — Other Ambulatory Visit: Payer: Self-pay

## 2020-04-18 ENCOUNTER — Ambulatory Visit (INDEPENDENT_AMBULATORY_CARE_PROVIDER_SITE_OTHER): Payer: Medicare Other

## 2020-04-18 DIAGNOSIS — Z23 Encounter for immunization: Secondary | ICD-10-CM

## 2020-04-24 NOTE — Progress Notes (Signed)
[place]  Telephone:(336) [phone] Fax:(336) [phone]  ID: Maria Davies OB: 1940-09-01  MR#: [id number]  CNO#:[id number]  Patient Care Team: Valerie Roys, DO as PCP - General (Family Medicine) Guadalupe Maple, MD as PCP - Family Medicine (Family Medicine) Ronny Bacon, MD as Consulting Physician (General Surgery) Lloyd Huger, MD as Consulting Physician (Oncology)   CHIEF COMPLAINT: Stage IIIb right colon cancer.  INTERVAL HISTORY: Patient returns to clinic today for further evaluation and consideration of cycle 11 of 12 of adjuvant FOLFOX.  She continues to have persistent weakness and fatigue as well as a decreased blood pressure.  She admits her fluid intake over the past several weeks has been poor.  She has no neurologic complaints.  She denies any recent fevers or illnesses.  She has a good appetite and denies weight loss.  She has no chest pain, shortness of breath, cough, or hemoptysis. She denies any nausea, vomiting, constipation, or diarrhea.  She has no melena or hematochezia.  She has no urinary complaints.  Patient offers no further specific complaints today.  REVIEW OF SYSTEMS:   Review of Systems  Constitutional: Positive for malaise/fatigue. Negative for fever and weight loss.  Respiratory: Negative.  Negative for cough, hemoptysis and shortness of breath.   Cardiovascular: Negative.  Negative for chest pain and leg swelling.  Gastrointestinal: Negative.  Negative for abdominal pain, blood in stool and melena.  Genitourinary: Negative.  Negative for hematuria.  Musculoskeletal: Negative.  Negative for back pain.  Skin: Negative.  Negative for rash.  Neurological: Positive for weakness. Negative for dizziness, focal weakness and headaches.  Psychiatric/Behavioral: Negative.  The patient is not nervous/anxious.     As per HPI. Otherwise, a complete review of systems is negative.  PAST MEDICAL HISTORY: Past Medical History:    Diagnosis Date  . Anemia   . Arthritis   . Basal cell carcinoma    back  . Breast cancer ([place]) 2010   RT LUMPECTOMY  . Cancer ([place])    rt breast  . Chemotherapy induced nausea and vomiting   . Colon cancer ([place]) 2021   colon cancer  . Heart murmur   . Hypertension   . Hypothyroidism    currently not on meds  . Osteopenia   . Personal history of chemotherapy 2010   right breast ca  . Personal history of radiation therapy 2010   right breast ca  . Port-A-Cath in place     PAST SURGICAL HISTORY: Past Surgical History:  Procedure Laterality Date  . BACK SURGERY    . BREAST EXCISIONAL BIOPSY Right 03/2009   radiation and chemo +  . BREAST EXCISIONAL BIOPSY Bilateral    NEG  . BREAST SURGERY    . CATARACT EXTRACTION W/ INTRAOCULAR LENS  IMPLANT, BILATERAL    . CHOLECYSTECTOMY    . COLONOSCOPY WITH PROPOFOL N/A 10/19/2019   Procedure: COLONOSCOPY WITH PROPOFOL-attempted, unable to perform poor prep;  Surgeon: Lucilla Lame, MD;  Location: [place];  Service: Endoscopy;  Laterality: N/A;  priority 3  . COLONOSCOPY WITH PROPOFOL N/A 10/20/2019   Procedure: COLONOSCOPY WITH PROPOFOL;  Surgeon: Lucilla Lame, MD;  Location: [facility] ENDOSCOPY;  Service: Endoscopy;  Laterality: N/A;  . ESOPHAGEAL DILATION  10/19/2019   Procedure: ESOPHAGEAL DILATION;  Surgeon: Lucilla Lame, MD;  Location: [place];  Service: Endoscopy;;  . ESOPHAGOGASTRODUODENOSCOPY (EGD) WITH PROPOFOL N/A 10/19/2019   Procedure: ESOPHAGOGASTRODUODENOSCOPY (EGD) WITH PROPOFOL;  Surgeon: Lucilla Lame, MD;  Location: [place];  Service: Endoscopy;  Laterality: N/A;  . PORTACATH PLACEMENT Right 11/23/2019   Procedure: INSERTION PORT-A-CATH;  Surgeon: Ronny Bacon, MD;  Location: [facility];  Service: General;  Laterality: Right;    FAMILY HISTORY: Family History  Problem Relation Age of Onset  . Cancer Mother   . Heart disease Father   . Breast cancer Paternal Aunt 22    ADVANCED  DIRECTIVES (Y/N):  N  HEALTH MAINTENANCE: Social History   Tobacco Use  . Smoking status: Never Smoker  . Smokeless tobacco: Never Used  Vaping Use  . Vaping Use: Never used  Substance Use Topics  . Alcohol use: Yes    Alcohol/week: 2.0 standard drinks    Types: 2 Standard drinks or equivalent per week    Comment: only on weeks not having treatment  . Drug use: No     Colonoscopy:  PAP:  Bone density:  Lipid panel:  No Known Allergies  Current Outpatient Medications  Medication Sig Dispense Refill  . acetaminophen (TYLENOL) 325 MG tablet Take 325 mg by mouth 3 (three) times daily as needed for moderate pain or headache.    Marland Kitchen amLODipine (NORVASC) 2.5 MG tablet Take 1 tablet (2.5 mg total) by mouth daily. 90 tablet 1  . aspirin EC 81 MG tablet Take 81 mg by mouth daily.    . benazepril (LOTENSIN) 40 MG tablet Take 1 tablet (40 mg total) by mouth daily. 90 tablet 1  . Cholecalciferol (VITAMIN D3) 50 MCG (2000 UT) capsule Take 4,000 Units by mouth daily.     Marland Kitchen lidocaine-prilocaine (EMLA) cream Apply to affected area once (Patient taking differently: Apply 1 application topically daily as needed (port access). ) 30 g 3  . methimazole (TAPAZOLE) 5 MG tablet Take 5 mg by mouth daily.     . metoprolol succinate (TOPROL-XL) 50 MG 24 hr tablet Take 1 tablet (50 mg total) by mouth daily. Take with or immediately following a meal. 90 tablet 0  . Multiple Vitamin (MULTI-VITAMINS) TABS Take 1 tablet by mouth daily.     . ondansetron (ZOFRAN) 8 MG tablet Take 1 tablet (8 mg total) by mouth 2 (two) times daily as needed for refractory nausea / vomiting. 60 tablet 2  . Phenylephrine HCl (NEO-SYNEPHRINE NA) Place 1 spray into the nose daily as needed (congestion).     . Probiotic Product (PROBIOTIC ADVANCED PO) Take 1-2 capsules by mouth daily. When taking antibiotics pt will take 2 capsules instead of 1, normally just takes 1 per day otherwise    . prochlorperazine (COMPAZINE) 10 MG tablet Take  1 tablet (10 mg total) by mouth every 6 (six) hours as needed (Nausea or vomiting). 60 tablet 2  . silver sulfADIAZINE (SILVADENE) 1 % cream Apply 1 application topically daily. 50 g 0  . Sodium Hyaluronate, oral, (HYALURONIC ACID PO) Take 1 tablet by mouth daily.      No current facility-administered medications for this visit.   Facility-Administered Medications Ordered in Other Visits  Medication Dose Route Frequency Provider Last Rate Last Admin  . 0.9 %  sodium chloride infusion   Intravenous Once Lloyd Huger, MD      . heparin lock flush 100 unit/mL  500 Units Intravenous Once Lloyd Huger, MD      . sodium chloride flush (NS) 0.9 % injection 10 mL  10 mL Intravenous PRN Lloyd Huger, MD   10 mL at 03/07/20 0836  . sodium chloride flush (NS) 0.9 % injection 10 mL  10 mL Intravenous PRN Lloyd Huger, MD   10 mL at 04/25/20 0830    OBJECTIVE: Vitals:   04/25/20 0848  BP: (!) 82/51  Pulse: 82  Resp: 20  Temp: (!) 97.3 F (36.3 C)     Body mass index is 24.76 kg/m.    ECOG FS:1 - Symptomatic but completely ambulatory  General: Well-developed, well-nourished, no acute distress. Eyes: Pink conjunctiva, anicteric sclera. HEENT: Normocephalic, moist mucous membranes. Lungs: No audible wheezing or coughing. Heart: Regular rate and rhythm. Abdomen: Soft, nontender, no obvious distention. Musculoskeletal: No edema, cyanosis, or clubbing. Neuro: Alert, answering all questions appropriately. Cranial nerves grossly intact. Skin: No rashes or petechiae noted. Psych: Normal affect.  LAB RESULTS:  Lab Results  Component Value Date   NA 136 04/25/2020   K 3.3 (L) 04/25/2020   CL 105 04/25/2020   CO2 23 04/25/2020   GLUCOSE 137 (H) 04/25/2020   BUN 9 04/25/2020   CREATININE 0.56 04/25/2020   CALCIUM 8.9 04/25/2020   PROT 6.0 (L) 04/25/2020   ALBUMIN 2.9 (L) 04/25/2020   AST 58 (H) 04/25/2020   ALT 39 04/25/2020   ALKPHOS 141 (H) 04/25/2020    BILITOT 1.1 04/25/2020   GFRNONAA >60 04/25/2020   GFRAA >60 03/14/2020    Lab Results  Component Value Date   WBC 3.5 (L) 04/25/2020   NEUTROABS 1.1 (L) 04/25/2020   HGB 11.0 (L) 04/25/2020   HCT 31.4 (L) 04/25/2020   MCV 95.4 04/25/2020   PLT 126 (L) 04/25/2020     STUDIES: No results found.  ASSESSMENT: Stage IIIb right colon cancer  PLAN:    1. Stage IIIb right colon cancer: Pathology results reviewed, confirming stage of disease.  CT scan results from Oct 28, 2019 reviewed independently  with no obvious evidence of metastatic disease.  Patient has now had port placement.  Proceed with cycle 11 of 12 of adjuvant FOLFOX today.  Patient also receive an additional liter of fluids with treatment.  Return to clinic in 2 days for pump removal and IV fluids and then in 2 weeks for further evaluation and consideration of cycle 12.   2.  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  She completed 5 years of anastrozole in June 2016.  Her most recent mammogram 1 February 03, 2020 was reported as BI-RADS 1.  Repeat in August 2022.   3.  Anemia: Hemoglobin has slightly declined to 11.0.  Monitor. 4.  Neutropenia: ANC is 1.1 today.  Proceed cautiously with treatment as above. 5.  Hypokalemia: Chronic and unchanged.  Continue dietary changes. 6.  Thrombocytopenia: Chronic and unchanged.  Proceed with treatment as above.   Patient expressed understanding and was in agreement with this plan. She also understands that She can call clinic at any time with any questions, concerns, or complaints.   Cancer Staging Colon cancer [facility]) Staging form: Colon and Rectum, AJCC 8th Edition - Clinical stage from 11/13/2019: Stage IIIB (cT3, cN1a, cM0) - Signed by Lloyd Huger, MD on 11/13/2019   Lloyd Huger, MD   04/25/2020 9:14 AM

## 2020-04-25 ENCOUNTER — Other Ambulatory Visit: Payer: Self-pay

## 2020-04-25 ENCOUNTER — Inpatient Hospital Stay: Payer: Medicare Other | Attending: Oncology

## 2020-04-25 ENCOUNTER — Encounter: Payer: Self-pay | Admitting: Oncology

## 2020-04-25 ENCOUNTER — Inpatient Hospital Stay: Payer: Medicare Other

## 2020-04-25 ENCOUNTER — Inpatient Hospital Stay (HOSPITAL_BASED_OUTPATIENT_CLINIC_OR_DEPARTMENT_OTHER): Payer: Medicare Other | Admitting: Oncology

## 2020-04-25 VITALS — BP 82/51 | HR 82 | Temp 97.3°F | Resp 20 | Wt 139.8 lb

## 2020-04-25 VITALS — BP 100/60 | HR 73 | Resp 18

## 2020-04-25 DIAGNOSIS — Z5111 Encounter for antineoplastic chemotherapy: Secondary | ICD-10-CM | POA: Insufficient documentation

## 2020-04-25 DIAGNOSIS — D649 Anemia, unspecified: Secondary | ICD-10-CM | POA: Insufficient documentation

## 2020-04-25 DIAGNOSIS — Z853 Personal history of malignant neoplasm of breast: Secondary | ICD-10-CM | POA: Diagnosis not present

## 2020-04-25 DIAGNOSIS — D696 Thrombocytopenia, unspecified: Secondary | ICD-10-CM | POA: Insufficient documentation

## 2020-04-25 DIAGNOSIS — R531 Weakness: Secondary | ICD-10-CM | POA: Diagnosis not present

## 2020-04-25 DIAGNOSIS — I10 Essential (primary) hypertension: Secondary | ICD-10-CM | POA: Diagnosis not present

## 2020-04-25 DIAGNOSIS — R011 Cardiac murmur, unspecified: Secondary | ICD-10-CM | POA: Diagnosis not present

## 2020-04-25 DIAGNOSIS — Z803 Family history of malignant neoplasm of breast: Secondary | ICD-10-CM | POA: Insufficient documentation

## 2020-04-25 DIAGNOSIS — E039 Hypothyroidism, unspecified: Secondary | ICD-10-CM | POA: Insufficient documentation

## 2020-04-25 DIAGNOSIS — Z7982 Long term (current) use of aspirin: Secondary | ICD-10-CM | POA: Diagnosis not present

## 2020-04-25 DIAGNOSIS — C182 Malignant neoplasm of ascending colon: Secondary | ICD-10-CM | POA: Diagnosis not present

## 2020-04-25 DIAGNOSIS — R5383 Other fatigue: Secondary | ICD-10-CM | POA: Insufficient documentation

## 2020-04-25 DIAGNOSIS — R630 Anorexia: Secondary | ICD-10-CM | POA: Insufficient documentation

## 2020-04-25 DIAGNOSIS — Z809 Family history of malignant neoplasm, unspecified: Secondary | ICD-10-CM | POA: Diagnosis not present

## 2020-04-25 DIAGNOSIS — D509 Iron deficiency anemia, unspecified: Secondary | ICD-10-CM | POA: Insufficient documentation

## 2020-04-25 DIAGNOSIS — R42 Dizziness and giddiness: Secondary | ICD-10-CM | POA: Insufficient documentation

## 2020-04-25 DIAGNOSIS — M129 Arthropathy, unspecified: Secondary | ICD-10-CM | POA: Insufficient documentation

## 2020-04-25 DIAGNOSIS — D709 Neutropenia, unspecified: Secondary | ICD-10-CM | POA: Insufficient documentation

## 2020-04-25 DIAGNOSIS — E876 Hypokalemia: Secondary | ICD-10-CM | POA: Insufficient documentation

## 2020-04-25 DIAGNOSIS — Z79899 Other long term (current) drug therapy: Secondary | ICD-10-CM | POA: Insufficient documentation

## 2020-04-25 DIAGNOSIS — R7989 Other specified abnormal findings of blood chemistry: Secondary | ICD-10-CM | POA: Insufficient documentation

## 2020-04-25 DIAGNOSIS — M8580 Other specified disorders of bone density and structure, unspecified site: Secondary | ICD-10-CM | POA: Insufficient documentation

## 2020-04-25 DIAGNOSIS — I959 Hypotension, unspecified: Secondary | ICD-10-CM | POA: Insufficient documentation

## 2020-04-25 LAB — CBC WITH DIFFERENTIAL/PLATELET
Abs Immature Granulocytes: 0.01 10*3/uL (ref 0.00–0.07)
Basophils Absolute: 0.1 10*3/uL (ref 0.0–0.1)
Basophils Relative: 1 %
Eosinophils Absolute: 0.2 10*3/uL (ref 0.0–0.5)
Eosinophils Relative: 5 %
HCT: 31.4 % — ABNORMAL LOW (ref 36.0–46.0)
Hemoglobin: 11 g/dL — ABNORMAL LOW (ref 12.0–15.0)
Immature Granulocytes: 0 %
Lymphocytes Relative: 36 %
Lymphs Abs: 1.2 10*3/uL (ref 0.7–4.0)
MCH: 33.4 pg (ref 26.0–34.0)
MCHC: 35 g/dL (ref 30.0–36.0)
MCV: 95.4 fL (ref 80.0–100.0)
Monocytes Absolute: 0.9 10*3/uL (ref 0.1–1.0)
Monocytes Relative: 25 %
Neutro Abs: 1.1 10*3/uL — ABNORMAL LOW (ref 1.7–7.7)
Neutrophils Relative %: 33 %
Platelets: 126 10*3/uL — ABNORMAL LOW (ref 150–400)
RBC: 3.29 MIL/uL — ABNORMAL LOW (ref 3.87–5.11)
RDW: 15.9 % — ABNORMAL HIGH (ref 11.5–15.5)
WBC: 3.5 10*3/uL — ABNORMAL LOW (ref 4.0–10.5)
nRBC: 0 % (ref 0.0–0.2)

## 2020-04-25 LAB — COMPREHENSIVE METABOLIC PANEL
ALT: 39 U/L (ref 0–44)
AST: 58 U/L — ABNORMAL HIGH (ref 15–41)
Albumin: 2.9 g/dL — ABNORMAL LOW (ref 3.5–5.0)
Alkaline Phosphatase: 141 U/L — ABNORMAL HIGH (ref 38–126)
Anion gap: 8 (ref 5–15)
BUN: 9 mg/dL (ref 8–23)
CO2: 23 mmol/L (ref 22–32)
Calcium: 8.9 mg/dL (ref 8.9–10.3)
Chloride: 105 mmol/L (ref 98–111)
Creatinine, Ser: 0.56 mg/dL (ref 0.44–1.00)
GFR, Estimated: >60 mL/min (ref >60)
Glucose, Bld: 137 mg/dL — ABNORMAL HIGH (ref 70–99)
Potassium: 3.3 mmol/L — ABNORMAL LOW (ref 3.5–5.1)
Sodium: 136 mmol/L (ref 135–145)
Total Bilirubin: 1.1 mg/dL (ref 0.3–1.2)
Total Protein: 6 g/dL — ABNORMAL LOW (ref 6.5–8.1)

## 2020-04-25 MED ORDER — SODIUM CHLORIDE 0.9% FLUSH
10.0000 mL | INTRAVENOUS | Status: DC | PRN
  Administered 2020-04-25: 10 mL via INTRAVENOUS
  Filled 2020-04-25: qty 10

## 2020-04-25 MED ORDER — HEPARIN SOD (PORK) LOCK FLUSH 100 UNIT/ML IV SOLN
500.0000 [IU] | Freq: Once | INTRAVENOUS | Status: DC
  Filled 2020-04-25: qty 5

## 2020-04-25 MED ORDER — SODIUM CHLORIDE 0.9 % IV SOLN
Freq: Once | INTRAVENOUS | Status: AC
  Administered 2020-04-25: 1000 mL via INTRAVENOUS
  Filled 2020-04-25: qty 250

## 2020-04-25 MED ORDER — LEUCOVORIN CALCIUM INJECTION 350 MG
650.0000 mg | Freq: Once | INTRAVENOUS | Status: AC
  Administered 2020-04-25: 650 mg via INTRAVENOUS
  Filled 2020-04-25: qty 32.5

## 2020-04-25 MED ORDER — SODIUM CHLORIDE 0.9 % IV SOLN
2400.0000 mg/m2 | INTRAVENOUS | Status: DC
  Administered 2020-04-25: 4050 mg via INTRAVENOUS
  Filled 2020-04-25: qty 81

## 2020-04-25 MED ORDER — SODIUM CHLORIDE 0.9 % IV SOLN
10.0000 mg | Freq: Once | INTRAVENOUS | Status: AC
  Administered 2020-04-25: 10 mg via INTRAVENOUS
  Filled 2020-04-25: qty 10

## 2020-04-25 MED ORDER — SODIUM CHLORIDE 0.9 % IV SOLN: 2400.0000 mg/m2 | INTRAVENOUS | Status: DC

## 2020-04-25 MED ORDER — DEXTROSE 5 % IV SOLN
Freq: Once | INTRAVENOUS | Status: AC
  Filled 2020-04-25: qty 250

## 2020-04-25 MED ORDER — OXALIPLATIN CHEMO INJECTION 100 MG/20ML
85.0000 mg/m2 | Freq: Once | INTRAVENOUS | Status: AC
  Administered 2020-04-25: 145 mg via INTRAVENOUS
  Filled 2020-04-25: qty 10

## 2020-04-25 MED ORDER — OXALIPLATIN CHEMO INJECTION 100 MG/20ML: 85.0000 mg/m2 | Freq: Once | INTRAVENOUS | Status: DC

## 2020-04-25 MED ORDER — LEUCOVORIN CALCIUM INJECTION 350 MG: 400.0000 mg/m2 | Freq: Once | INTRAVENOUS | Status: DC

## 2020-04-25 MED ORDER — FLUOROURACIL CHEMO INJECTION 2.5 GM/50ML
400.0000 mg/m2 | Freq: Once | INTRAVENOUS | Status: AC
  Administered 2020-04-25: 650 mg via INTRAVENOUS
  Filled 2020-04-25: qty 13

## 2020-04-25 MED ORDER — FLUOROURACIL CHEMO INJECTION 2.5 GM/50ML: 400.0000 mg/m2 | Freq: Once | INTRAVENOUS | Status: DC

## 2020-04-25 MED ORDER — PALONOSETRON HCL INJECTION 0.25 MG/5ML
0.2500 mg | Freq: Once | INTRAVENOUS | Status: AC
  Administered 2020-04-25: 0.25 mg via INTRAVENOUS
  Filled 2020-04-25: qty 5

## 2020-04-25 NOTE — Progress Notes (Signed)
1003- Blood Pressure: 100/60. MD, Dr. Grayland Ormond, notified and aware. Per MD order: proceed with scheduled FOLFOX treatment today.  1340- Patient tolerated treatment well. Patient discharged to home with Fluorouracil Pump in place.

## 2020-04-25 NOTE — Progress Notes (Signed)
Patient here today for follow up and treatment consideration regarding colon cancer. Patient reports fatigue, denies any other concerns today.

## 2020-04-26 ENCOUNTER — Other Ambulatory Visit: Payer: Self-pay | Admitting: Family Medicine

## 2020-04-26 DIAGNOSIS — I10 Essential (primary) hypertension: Secondary | ICD-10-CM

## 2020-04-27 ENCOUNTER — Inpatient Hospital Stay: Payer: Medicare Other

## 2020-04-27 ENCOUNTER — Other Ambulatory Visit: Payer: Self-pay

## 2020-04-27 VITALS — BP 102/65 | HR 88 | Temp 96.3°F | Resp 20

## 2020-04-27 DIAGNOSIS — C182 Malignant neoplasm of ascending colon: Secondary | ICD-10-CM | POA: Diagnosis not present

## 2020-04-27 DIAGNOSIS — R011 Cardiac murmur, unspecified: Secondary | ICD-10-CM | POA: Diagnosis not present

## 2020-04-27 DIAGNOSIS — D649 Anemia, unspecified: Secondary | ICD-10-CM | POA: Diagnosis not present

## 2020-04-27 DIAGNOSIS — Z5111 Encounter for antineoplastic chemotherapy: Secondary | ICD-10-CM | POA: Diagnosis not present

## 2020-04-27 DIAGNOSIS — M129 Arthropathy, unspecified: Secondary | ICD-10-CM | POA: Diagnosis not present

## 2020-04-27 DIAGNOSIS — I10 Essential (primary) hypertension: Secondary | ICD-10-CM | POA: Diagnosis not present

## 2020-04-27 MED ORDER — SODIUM CHLORIDE 0.9% FLUSH
10.0000 mL | INTRAVENOUS | Status: DC | PRN
  Administered 2020-04-27: 10 mL
  Filled 2020-04-27: qty 10

## 2020-04-27 MED ORDER — HEPARIN SOD (PORK) LOCK FLUSH 100 UNIT/ML IV SOLN
500.0000 [IU] | Freq: Once | INTRAVENOUS | Status: AC | PRN
  Administered 2020-04-27: 500 [IU]
  Filled 2020-04-27: qty 5

## 2020-04-27 MED ORDER — SODIUM CHLORIDE 0.9 % IV SOLN
Freq: Once | INTRAVENOUS | Status: AC
  Administered 2020-04-27: 1000 mL via INTRAVENOUS
  Filled 2020-04-27: qty 250

## 2020-04-27 MED ORDER — HEPARIN SOD (PORK) LOCK FLUSH 100 UNIT/ML IV SOLN
INTRAVENOUS | Status: AC
  Filled 2020-04-27: qty 5

## 2020-04-27 NOTE — Progress Notes (Signed)
1335- Patient is here for Fluorouracil Pump disconnect and 0.9 % Sodium Chloride infusion today. Patient reports having diarrhea over the last 3 days and says she has not tried taking any medication for the diarrhea yet. Patient reports the diarrhea has not been as bad today and states, "It's not too bad now. I only have it some after I eat now." Vital signs stable, see flow sheet. MD, Dr. Grayland Ormond, notified and aware. Per MD order: instruct patient to take Imodium at home for diarrhea. Patient educated and verbalized understanding.

## 2020-05-07 NOTE — Progress Notes (Signed)
[place]  Telephone:(336) [phone] Fax:(336) [phone]  ID: Maria Davies OB: Mar 28, 1941  MR#: [id number]  DQQ#:[id number]  Patient Care Team: Valerie Roys, DO as PCP - General (Family Medicine) Guadalupe Maple, MD as PCP - Family Medicine (Family Medicine) Ronny Bacon, MD as Consulting Physician (General Surgery) Lloyd Huger, MD as Consulting Physician (Oncology)   CHIEF COMPLAINT: Stage IIIb right colon cancer.  INTERVAL HISTORY: Patient returns to clinic today for further evaluation and consideration of cycle 12 of 12 of adjuvant FOLFOX.  She continues to have a poor appetite and minimal p.o. intake.  She has occasional dizziness and is orthostatic in clinic.  She has significant weakness and fatigue today.  She has no other neurologic complaints. She denies any recent fevers or illnesses.  She has a good appetite and denies weight loss.  She has no chest pain, shortness of breath, cough, or hemoptysis. She denies any vomiting, constipation, or diarrhea.  She has no melena or hematochezia.  She has no urinary complaints.  Patient offers no further specific complaints today.  REVIEW OF SYSTEMS:   Review of Systems  Constitutional: Positive for malaise/fatigue. Negative for fever and weight loss.  Respiratory: Negative.  Negative for cough, hemoptysis and shortness of breath.   Cardiovascular: Negative.  Negative for chest pain and leg swelling.  Gastrointestinal: Positive for nausea. Negative for abdominal pain, blood in stool and melena.  Genitourinary: Negative.  Negative for hematuria.  Musculoskeletal: Negative.  Negative for back pain.  Skin: Negative.  Negative for rash.  Neurological: Positive for dizziness and weakness. Negative for focal weakness and headaches.  Psychiatric/Behavioral: Negative.  The patient is not nervous/anxious.     As per HPI. Otherwise, a complete review of systems is negative.  PAST MEDICAL HISTORY: Past Medical  History:  Diagnosis Date  . Anemia   . Arthritis   . Basal cell carcinoma    back  . Breast cancer ([place]) 2010   RT LUMPECTOMY  . Cancer ([place])    rt breast  . Chemotherapy induced nausea and vomiting   . Colon cancer ([place]) 2021   colon cancer  . Heart murmur   . Hypertension   . Hypothyroidism    currently not on meds  . Osteopenia   . Personal history of chemotherapy 2010   right breast ca  . Personal history of radiation therapy 2010   right breast ca  . Port-A-Cath in place     PAST SURGICAL HISTORY: Past Surgical History:  Procedure Laterality Date  . BACK SURGERY    . BREAST EXCISIONAL BIOPSY Right 03/2009   radiation and chemo +  . BREAST EXCISIONAL BIOPSY Bilateral    NEG  . BREAST SURGERY    . CATARACT EXTRACTION W/ INTRAOCULAR LENS  IMPLANT, BILATERAL    . CHOLECYSTECTOMY    . COLONOSCOPY WITH PROPOFOL N/A 10/19/2019   Procedure: COLONOSCOPY WITH PROPOFOL-attempted, unable to perform poor prep;  Surgeon: Lucilla Lame, MD;  Location: [place];  Service: Endoscopy;  Laterality: N/A;  priority 3  . COLONOSCOPY WITH PROPOFOL N/A 10/20/2019   Procedure: COLONOSCOPY WITH PROPOFOL;  Surgeon: Lucilla Lame, MD;  Location[facility] END[facility]COPY;  Service: Endoscopy;  Laterality: N/A;  . ESOPHAGEAL DILATION  10/19/2019   Procedure: ESOPHAGEAL DILATION;  Surgeon: Lucilla Lame, MD;  Location: [place];  Service: Endoscopy;;  . ESOPHAGOGASTRODUODENOSCOPY (EGD) WITH PROPOFOL N/A 10/19/2019   Procedure: ESOPHAGOGASTRODUODENOSCOPY (EGD) WITH PROPOFOL;  Surgeon: Lucilla Lame, MD;  Location: [place];  Service: Endoscopy;  Laterality: N/A;  . PORTACATH PLACEMENT Right 11/23/2019   Procedure: INSERTION PORT-A-CATH;  Surgeon: Ronny Bacon, MD;  Location: [facility];  Service: General;  Laterality: Right;    FAMILY HISTORY: Family History  Problem Relation Age of Onset  . Cancer Mother   . Heart disease Father   . Breast cancer Paternal Aunt 76    ADVANCED  DIRECTIVES (Y/N):  N  HEALTH MAINTENANCE: Social History   Tobacco Use  . Smoking status: Never Smoker  . Smokeless tobacco: Never Used  Vaping Use  . Vaping Use: Never used  Substance Use Topics  . Alcohol use: Yes    Alcohol/week: 2.0 standard drinks    Types: 2 Standard drinks or equivalent per week    Comment: only on weeks not having treatment  . Drug use: No     Colonoscopy:  PAP:  Bone density:  Lipid panel:  No Known Allergies  Current Outpatient Medications  Medication Sig Dispense Refill  . acetaminophen (TYLENOL) 325 MG tablet Take 325 mg by mouth 3 (three) times daily as needed for moderate pain or headache.    Marland Kitchen amLODipine (NORVASC) 2.5 MG tablet Take 1 tablet (2.5 mg total) by mouth daily. 90 tablet 1  . aspirin EC 81 MG tablet Take 81 mg by mouth daily.    . benazepril (LOTENSIN) 40 MG tablet Take 1 tablet (40 mg total) by mouth daily. 90 tablet 1  . Cholecalciferol (VITAMIN D3) 50 MCG (2000 UT) capsule Take 4,000 Units by mouth daily.     Marland Kitchen lidocaine-prilocaine (EMLA) cream Apply to affected area once (Patient taking differently: Apply 1 application topically daily as needed (port access). ) 30 g 3  . methimazole (TAPAZOLE) 5 MG tablet Take 5 mg by mouth daily.     . metoprolol succinate (TOPROL-XL) 50 MG 24 hr tablet Take 1 tablet (50 mg total) by mouth daily. Take with or immediately following a meal. 90 tablet 0  . Multiple Vitamin (MULTI-VITAMINS) TABS Take 1 tablet by mouth daily.     . ondansetron (ZOFRAN) 8 MG tablet Take 1 tablet (8 mg total) by mouth 2 (two) times daily as needed for refractory nausea / vomiting. 60 tablet 2  . Phenylephrine HCl (NEO-SYNEPHRINE NA) Place 1 spray into the nose daily as needed (congestion).     . Probiotic Product (PROBIOTIC ADVANCED PO) Take 1-2 capsules by mouth daily. When taking antibiotics pt will take 2 capsules instead of 1, normally just takes 1 per day otherwise    . prochlorperazine (COMPAZINE) 10 MG tablet Take  1 tablet (10 mg total) by mouth every 6 (six) hours as needed (Nausea or vomiting). 60 tablet 2  . silver sulfADIAZINE (SILVADENE) 1 % cream Apply 1 application topically daily. 50 g 0  . Sodium Hyaluronate, oral, (HYALURONIC ACID PO) Take 1 tablet by mouth daily.      No current facility-administered medications for this visit.   Facility-Administered Medications Ordered in Other Visits  Medication Dose Route Frequency Provider Last Rate Last Admin  . sodium chloride flush (NS) 0.9 % injection 10 mL  10 mL Intravenous PRN Lloyd Huger, MD   10 mL at 03/07/20 0836    OBJECTIVE: Vitals:   05/09/20 0902 05/09/20 0907  BP: (!) 77/52 (!) 88/57  Pulse: 99 85  Temp: (!) 97.2 F (36.2 C)   SpO2: 100%      Body mass index is 23.61 kg/m.    ECOG FS:1 - Symptomatic but completely  ambulatory  General: Well-developed, well-nourished, no acute distress. Eyes: Pink conjunctiva, anicteric sclera. HEENT: Normocephalic, moist mucous membranes. Lungs: No audible wheezing or coughing. Heart: Regular rate and rhythm. Abdomen: Soft, nontender, no obvious distention. Musculoskeletal: No edema, cyanosis, or clubbing. Neuro: Alert, answering all questions appropriately. Cranial nerves grossly intact. Skin: No rashes or petechiae noted. Psych: Normal affect.  LAB RESULTS:  Lab Results  Component Value Date   NA 136 05/09/2020   K 2.4 (LL) 05/09/2020   CL 101 05/09/2020   CO2 22 05/09/2020   GLUCOSE 157 (H) 05/09/2020   BUN 15 05/09/2020   CREATININE 0.69 05/09/2020   CALCIUM 8.2 (L) 05/09/2020   PROT 5.2 (L) 05/09/2020   ALBUMIN 2.4 (L) 05/09/2020   AST 65 (H) 05/09/2020   ALT 46 (H) 05/09/2020   ALKPHOS 159 (H) 05/09/2020   BILITOT 1.3 (H) 05/09/2020   GFRNONAA >60 05/09/2020   GFRAA >60 03/14/2020    Lab Results  Component Value Date   WBC 7.8 05/09/2020   NEUTROABS 3.0 05/09/2020   HGB 11.8 (L) 05/09/2020   HCT 32.9 (L) 05/09/2020   MCV 93.2 05/09/2020   PLT 188  05/09/2020     STUDIES: No results found.  ASSESSMENT: Stage IIIb right colon cancer  PLAN:    1. Stage IIIb right colon cancer: Pathology results reviewed, confirming stage of disease.  CT scan results from Oct 28, 2019 reviewed independently  with no obvious evidence of metastatic disease.  Patient has now had port placement.  Given patient's declining performance status as well as hypotension, will discontinue chemotherapy altogether.  She received her last infusion of chemotherapy with cycle 11 of FOLFOX on April 25, 2020.  Proceed with IV fluids today and then again on Wednesday.  Return to clinic in 2 to 3 weeks for repeat laboratory work and further evaluation.     2.  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  She completed 5 years of anastrozole in June 2016.  Her most recent mammogram 1 February 03, 2020 was reported as BI-RADS 1.  Repeat in August 2022.   3.  Anemia: Hemoglobin has trended up to 11.8, but suspect this is partially due to hemoconcentration.  Monitor. 4.  Neutropenia: Resolved. 5.  Hypokalemia: Potassium level significantly worse today.  Patient will receive 40 mEq IV potassium today and then return to clinic on Wednesday for repeat laboratory work and additional potassium supplementation if needed. 6.  Thrombocytopenia: Resolved. 7.  Hypotension/dizziness: Patient received 2 L IV fluid today and then return to clinic on Wednesday for additional IV fluids if needed.  Further follow-up as above. 8.  Elevated LFTs: Mild, monitor.  Discontinue treatment as above.  Patient expressed understanding and was in agreement with this plan. She also understands that She can call clinic at any time with any questions, concerns, or complaints.   Cancer Staging Colon cancer [facility]) Staging form: Colon and Rectum, AJCC 8th Edition - Clinical stage from 11/13/2019: Stage IIIB (cT3, cN1a, cM0) - Signed  by Lloyd Huger, MD on 11/13/2019   Lloyd Huger, MD   05/09/2020 2:45 PM

## 2020-05-09 ENCOUNTER — Inpatient Hospital Stay: Payer: Medicare Other

## 2020-05-09 ENCOUNTER — Telehealth: Payer: Self-pay | Admitting: *Deleted

## 2020-05-09 ENCOUNTER — Inpatient Hospital Stay (HOSPITAL_BASED_OUTPATIENT_CLINIC_OR_DEPARTMENT_OTHER): Payer: Medicare Other | Admitting: Oncology

## 2020-05-09 ENCOUNTER — Other Ambulatory Visit: Payer: Self-pay

## 2020-05-09 ENCOUNTER — Encounter: Payer: Self-pay | Admitting: Oncology

## 2020-05-09 VITALS — BP 95/67 | HR 77

## 2020-05-09 VITALS — BP 88/57 | HR 85 | Temp 97.2°F | Wt 133.3 lb

## 2020-05-09 DIAGNOSIS — C182 Malignant neoplasm of ascending colon: Secondary | ICD-10-CM | POA: Diagnosis not present

## 2020-05-09 DIAGNOSIS — E876 Hypokalemia: Secondary | ICD-10-CM

## 2020-05-09 DIAGNOSIS — R011 Cardiac murmur, unspecified: Secondary | ICD-10-CM | POA: Diagnosis not present

## 2020-05-09 DIAGNOSIS — D649 Anemia, unspecified: Secondary | ICD-10-CM | POA: Diagnosis not present

## 2020-05-09 DIAGNOSIS — Z5111 Encounter for antineoplastic chemotherapy: Secondary | ICD-10-CM | POA: Diagnosis not present

## 2020-05-09 DIAGNOSIS — I10 Essential (primary) hypertension: Secondary | ICD-10-CM | POA: Diagnosis not present

## 2020-05-09 DIAGNOSIS — M129 Arthropathy, unspecified: Secondary | ICD-10-CM | POA: Diagnosis not present

## 2020-05-09 LAB — CBC WITH DIFFERENTIAL/PLATELET
Abs Immature Granulocytes: 0.61 10*3/uL — ABNORMAL HIGH (ref 0.00–0.07)
Basophils Absolute: 0.1 10*3/uL (ref 0.0–0.1)
Basophils Relative: 1 %
Eosinophils Absolute: 0.1 10*3/uL (ref 0.0–0.5)
Eosinophils Relative: 2 %
HCT: 32.9 % — ABNORMAL LOW (ref 36.0–46.0)
Hemoglobin: 11.8 g/dL — ABNORMAL LOW (ref 12.0–15.0)
Immature Granulocytes: 8 %
Lymphocytes Relative: 27 %
Lymphs Abs: 2.1 10*3/uL (ref 0.7–4.0)
MCH: 33.4 pg (ref 26.0–34.0)
MCHC: 35.9 g/dL (ref 30.0–36.0)
MCV: 93.2 fL (ref 80.0–100.0)
Monocytes Absolute: 1.9 10*3/uL — ABNORMAL HIGH (ref 0.1–1.0)
Monocytes Relative: 24 %
Neutro Abs: 3 10*3/uL (ref 1.7–7.7)
Neutrophils Relative %: 38 %
Platelets: 188 10*3/uL (ref 150–400)
RBC: 3.53 MIL/uL — ABNORMAL LOW (ref 3.87–5.11)
RDW: 17.2 % — ABNORMAL HIGH (ref 11.5–15.5)
Smear Review: NORMAL
WBC: 7.8 10*3/uL (ref 4.0–10.5)
nRBC: 0 % (ref 0.0–0.2)

## 2020-05-09 LAB — COMPREHENSIVE METABOLIC PANEL
ALT: 46 U/L — ABNORMAL HIGH (ref 0–44)
AST: 65 U/L — ABNORMAL HIGH (ref 15–41)
Albumin: 2.4 g/dL — ABNORMAL LOW (ref 3.5–5.0)
Alkaline Phosphatase: 159 U/L — ABNORMAL HIGH (ref 38–126)
Anion gap: 13 (ref 5–15)
BUN: 15 mg/dL (ref 8–23)
CO2: 22 mmol/L (ref 22–32)
Calcium: 8.2 mg/dL — ABNORMAL LOW (ref 8.9–10.3)
Chloride: 101 mmol/L (ref 98–111)
Creatinine, Ser: 0.69 mg/dL (ref 0.44–1.00)
GFR, Estimated: >60 mL/min (ref >60)
Glucose, Bld: 157 mg/dL — ABNORMAL HIGH (ref 70–99)
Potassium: 2.4 mmol/L — CL (ref 3.5–5.1)
Sodium: 136 mmol/L (ref 135–145)
Total Bilirubin: 1.3 mg/dL — ABNORMAL HIGH (ref 0.3–1.2)
Total Protein: 5.2 g/dL — ABNORMAL LOW (ref 6.5–8.1)

## 2020-05-09 MED ORDER — SODIUM CHLORIDE 0.9 % IV SOLN
Freq: Once | INTRAVENOUS | Status: AC
  Filled 2020-05-09: qty 250

## 2020-05-09 MED ORDER — SODIUM CHLORIDE 0.9% FLUSH
10.0000 mL | Freq: Once | INTRAVENOUS | Status: AC
  Administered 2020-05-09: 10 mL via INTRAVENOUS
  Filled 2020-05-09: qty 10

## 2020-05-09 MED ORDER — HEPARIN SOD (PORK) LOCK FLUSH 100 UNIT/ML IV SOLN
INTRAVENOUS | Status: AC
  Filled 2020-05-09: qty 5

## 2020-05-09 MED ORDER — SODIUM CHLORIDE 0.9 % IV SOLN
40.0000 meq | Freq: Once | INTRAVENOUS | Status: AC
  Administered 2020-05-09: 40 meq via INTRAVENOUS
  Filled 2020-05-09: qty 20

## 2020-05-09 MED ORDER — HEPARIN SOD (PORK) LOCK FLUSH 100 UNIT/ML IV SOLN
500.0000 [IU] | Freq: Once | INTRAVENOUS | Status: AC
  Administered 2020-05-09: 500 [IU] via INTRAVENOUS
  Filled 2020-05-09: qty 5

## 2020-05-09 NOTE — Telephone Encounter (Signed)
Dr Grayland Ormond saw pt in clinic. Added potassium to her treatment today

## 2020-05-09 NOTE — Telephone Encounter (Signed)
Lab called Critical results  Potassium is 2.4

## 2020-05-09 NOTE — Progress Notes (Signed)
BP improved after 2L IVF. 40 mEq Potassium IV given for critical lab result. Stable at discharge.

## 2020-05-11 ENCOUNTER — Inpatient Hospital Stay: Payer: Medicare Other

## 2020-05-11 ENCOUNTER — Other Ambulatory Visit: Payer: Self-pay | Admitting: Oncology

## 2020-05-11 ENCOUNTER — Other Ambulatory Visit: Payer: Self-pay

## 2020-05-11 DIAGNOSIS — M129 Arthropathy, unspecified: Secondary | ICD-10-CM | POA: Diagnosis not present

## 2020-05-11 DIAGNOSIS — C182 Malignant neoplasm of ascending colon: Secondary | ICD-10-CM | POA: Diagnosis not present

## 2020-05-11 DIAGNOSIS — Z5111 Encounter for antineoplastic chemotherapy: Secondary | ICD-10-CM | POA: Diagnosis not present

## 2020-05-11 DIAGNOSIS — D649 Anemia, unspecified: Secondary | ICD-10-CM | POA: Diagnosis not present

## 2020-05-11 DIAGNOSIS — I10 Essential (primary) hypertension: Secondary | ICD-10-CM | POA: Diagnosis not present

## 2020-05-11 DIAGNOSIS — R011 Cardiac murmur, unspecified: Secondary | ICD-10-CM | POA: Diagnosis not present

## 2020-05-11 LAB — COMPREHENSIVE METABOLIC PANEL
ALT: 40 U/L (ref 0–44)
AST: 54 U/L — ABNORMAL HIGH (ref 15–41)
Albumin: 2.3 g/dL — ABNORMAL LOW (ref 3.5–5.0)
Alkaline Phosphatase: 140 U/L — ABNORMAL HIGH (ref 38–126)
Anion gap: 11 (ref 5–15)
BUN: 9 mg/dL (ref 8–23)
CO2: 21 mmol/L — ABNORMAL LOW (ref 22–32)
Calcium: 8.3 mg/dL — ABNORMAL LOW (ref 8.9–10.3)
Chloride: 107 mmol/L (ref 98–111)
Creatinine, Ser: 0.65 mg/dL (ref 0.44–1.00)
GFR, Estimated: >60 mL/min (ref >60)
Glucose, Bld: 139 mg/dL — ABNORMAL HIGH (ref 70–99)
Potassium: 2.5 mmol/L — CL (ref 3.5–5.1)
Sodium: 139 mmol/L (ref 135–145)
Total Bilirubin: 0.8 mg/dL (ref 0.3–1.2)
Total Protein: 5.1 g/dL — ABNORMAL LOW (ref 6.5–8.1)

## 2020-05-11 LAB — CBC WITH DIFFERENTIAL/PLATELET
Abs Immature Granulocytes: 0.69 10*3/uL — ABNORMAL HIGH (ref 0.00–0.07)
Basophils Absolute: 0 10*3/uL (ref 0.0–0.1)
Basophils Relative: 0 %
Eosinophils Absolute: 0.1 10*3/uL (ref 0.0–0.5)
Eosinophils Relative: 2 %
HCT: 31.7 % — ABNORMAL LOW (ref 36.0–46.0)
Hemoglobin: 11.5 g/dL — ABNORMAL LOW (ref 12.0–15.0)
Immature Granulocytes: 10 %
Lymphocytes Relative: 28 %
Lymphs Abs: 2 10*3/uL (ref 0.7–4.0)
MCH: 33.7 pg (ref 26.0–34.0)
MCHC: 36.3 g/dL — ABNORMAL HIGH (ref 30.0–36.0)
MCV: 93 fL (ref 80.0–100.0)
Monocytes Absolute: 1.3 10*3/uL — ABNORMAL HIGH (ref 0.1–1.0)
Monocytes Relative: 17 %
Neutro Abs: 3.2 10*3/uL (ref 1.7–7.7)
Neutrophils Relative %: 43 %
Platelets: 201 10*3/uL (ref 150–400)
RBC: 3.41 MIL/uL — ABNORMAL LOW (ref 3.87–5.11)
RDW: 17.8 % — ABNORMAL HIGH (ref 11.5–15.5)
Smear Review: NORMAL
WBC: 7.2 10*3/uL (ref 4.0–10.5)
nRBC: 0.3 % — ABNORMAL HIGH (ref 0.0–0.2)

## 2020-05-11 MED ORDER — SODIUM CHLORIDE 0.9% FLUSH
10.0000 mL | Freq: Once | INTRAVENOUS | Status: AC
  Administered 2020-05-11: 10 mL via INTRAVENOUS
  Filled 2020-05-11: qty 10

## 2020-05-11 MED ORDER — HEPARIN SOD (PORK) LOCK FLUSH 100 UNIT/ML IV SOLN
INTRAVENOUS | Status: AC
  Filled 2020-05-11: qty 5

## 2020-05-11 MED ORDER — POTASSIUM CHLORIDE IN NACL 20-0.9 MEQ/L-% IV SOLN
Freq: Once | INTRAVENOUS | Status: AC
  Filled 2020-05-11: qty 1000

## 2020-05-11 MED ORDER — POTASSIUM CHLORIDE CRYS ER 20 MEQ PO TBCR: 20.0000 meq | EXTENDED_RELEASE_TABLET | Freq: Two times a day (BID) | ORAL | 2 refills | Status: DC

## 2020-05-11 NOTE — Progress Notes (Signed)
Pt tolerated infusion well with no signs of complications. Prior to discharge pt.'s BP 98/64, pt has no complaints at this time. RN educated pt on the importance of notifying the clinic if any complications occur at home, pt verbalized understanding. Pt stable for discharge. Pt discharged home.   Maria Davies [employer]

## 2020-05-25 DIAGNOSIS — Z23 Encounter for immunization: Secondary | ICD-10-CM | POA: Diagnosis not present

## 2020-05-28 NOTE — Progress Notes (Signed)
[place]  [phone] Fax:(336) [phone]  ID: Maria Davies OB: 09/05/1940  MR#: [id number]  JIR#:[id number]  Patient Care Team: Valerie Roys, DO as PCP - General (Family Medicine) Guadalupe Maple, MD as PCP - Family Medicine (Family Medicine) Ronny Bacon, MD as Consulting Physician (General Surgery) Lloyd Huger, MD as Consulting Physician (Oncology)   CHIEF COMPLAINT: Stage IIIb right colon cancer.  INTERVAL HISTORY: Patient returns to clinic today for repeat laboratory work, further evaluation, and additional IV fluids.  She continues to have persistent weakness and fatigue and a poor appetite, but states these both have significantly improved.  She has gained weight in the interim.  She continues to have occasional dizziness, but states this has improved as well.  She has no other neurologic complaints. She denies any recent fevers or illnesses.  She has a good appetite and denies weight loss.  She has no chest pain, shortness of breath, cough, or hemoptysis. She denies any vomiting, constipation, or diarrhea.  She has no melena or hematochezia.  She has no urinary complaints.  Patient offers no further specific complaints today.  REVIEW OF SYSTEMS:   Review of Systems  Constitutional: Positive for malaise/fatigue. Negative for fever and weight loss.  Respiratory: Negative.  Negative for cough, hemoptysis and shortness of breath.   Cardiovascular: Negative.  Negative for chest pain and leg swelling.  Gastrointestinal: Positive for nausea. Negative for abdominal pain, blood in stool and melena.  Genitourinary: Negative.  Negative for hematuria.  Musculoskeletal: Negative.  Negative for back pain.  Skin: Negative.  Negative for rash.  Neurological: Positive for dizziness and weakness. Negative for focal weakness and headaches.  Psychiatric/Behavioral: Negative.  The patient is not nervous/anxious.     As per HPI. Otherwise, a  complete review of systems is negative.  PAST MEDICAL HISTORY: Past Medical History:  Diagnosis Date  . Anemia   . Arthritis   . Basal cell carcinoma    back  . Breast cancer ([place]) 2010   RT LUMPECTOMY  . Cancer ([place])    rt breast  . Chemotherapy induced nausea and vomiting   . Colon cancer ([place]) 2021   colon cancer  . Heart murmur   . Hypertension   . Hypothyroidism    currently not on meds  . Osteopenia   . Personal history of chemotherapy 2010   right breast ca  . Personal history of radiation therapy 2010   right breast ca  . Port-A-Cath in place     PAST SURGICAL HISTORY: Past Surgical History:  Procedure Laterality Date  . BACK SURGERY    . BREAST EXCISIONAL BIOPSY Right 03/2009   radiation and chemo +  . BREAST EXCISIONAL BIOPSY Bilateral    NEG  . BREAST SURGERY    . CATARACT EXTRACTION W/ INTRAOCULAR LENS  IMPLANT, BILATERAL    . CHOLECYSTECTOMY    . COLONOSCOPY WITH PROPOFOL N/A 10/19/2019   Procedure: COLONOSCOPY WITH PROPOFOL-attempted, unable to perform poor prep;  Surgeon: Lucilla Lame, MD;  Location: [place];  Service: Endoscopy;  Laterality: N/A;  priority 3  . COLONOSCOPY WITH PROPOFOL N/A 10/20/2019   Procedure: COLONOSCOPY WITH PROPOFOL;  Surgeon: Lucilla Lame, MD;  Location[facility] END[facility]COPY;  Service: Endoscopy;  Laterality: N/A;  . ESOPHAGEAL DILATION  10/19/2019   Procedure: ESOPHAGEAL DILATION;  Surgeon: Lucilla Lame, MD;  Location: [place];  Service: Endoscopy;;  . ESOPHAGOGASTRODUODENOSCOPY (EGD) WITH PROPOFOL N/A 10/19/2019   Procedure: ESOPHAGOGASTRODUODENOSCOPY (EGD) WITH PROPOFOL;  Surgeon: Lucilla Lame, MD;  Location: [place];  Service: Endoscopy;  Laterality: N/A;  . PORTACATH PLACEMENT Right 11/23/2019   Procedure: INSERTION PORT-A-CATH;  Surgeon: Ronny Bacon, MD;  Location: [facility] ORS;  Service: General;  Laterality: Right;    FAMILY HISTORY: Family History  Problem Relation Age of Onset  . Cancer  Mother   . Heart disease Father   . Breast cancer Paternal Aunt 34    ADVANCED DIRECTIVES (Y/N):  N  HEALTH MAINTENANCE: Social History   Tobacco Use  . Smoking status: Never Smoker  . Smokeless tobacco: Never Used  Vaping Use  . Vaping Use: Never used  Substance Use Topics  . Alcohol use: Yes    Alcohol/week: 2.0 standard drinks    Types: 2 Standard drinks or equivalent per week    Comment: only on weeks not having treatment  . Drug use: No     Colonoscopy:  PAP:  Bone density:  Lipid panel:  No Known Allergies  Current Outpatient Medications  Medication Sig Dispense Refill  . acetaminophen (TYLENOL) 325 MG tablet Take 325 mg by mouth 3 (three) times daily as needed for moderate pain or headache.    Marland Kitchen amLODipine (NORVASC) 2.5 MG tablet Take 1 tablet (2.5 mg total) by mouth daily. 90 tablet 1  . aspirin EC 81 MG tablet Take 81 mg by mouth daily.    . benazepril (LOTENSIN) 40 MG tablet Take 1 tablet (40 mg total) by mouth daily. 90 tablet 1  . methimazole (TAPAZOLE) 5 MG tablet Take 5 mg by mouth daily.     . metoprolol succinate (TOPROL-XL) 50 MG 24 hr tablet Take 1 tablet (50 mg total) by mouth daily. Take with or immediately following a meal. 90 tablet 0  . Multiple Vitamin (MULTI-VITAMINS) TABS Take 1 tablet by mouth daily.     . potassium chloride SA (KLOR-CON) 20 MEQ tablet Take 1 tablet (20 mEq total) by mouth 2 (two) times daily. 60 tablet 2  . Probiotic Product (PROBIOTIC ADVANCED PO) Take 1-2 capsules by mouth daily. When taking antibiotics pt will take 2 capsules instead of 1, normally just takes 1 per day otherwise    . prochlorperazine (COMPAZINE) 10 MG tablet Take 1 tablet (10 mg total) by mouth every 6 (six) hours as needed (Nausea or vomiting). 60 tablet 2  . Sodium Hyaluronate, oral, (HYALURONIC ACID PO) Take 1 tablet by mouth daily.     . Cholecalciferol (VITAMIN D3) 50 MCG (2000 UT) capsule Take 4,000 Units by mouth daily.  (Patient not taking: Reported on  05/31/2020)    . lidocaine-prilocaine (EMLA) cream Apply to affected area once (Patient taking differently: Apply 1 application topically daily as needed (port access). ) 30 g 3  . ondansetron (ZOFRAN) 8 MG tablet Take 1 tablet (8 mg total) by mouth 2 (two) times daily as needed for refractory nausea / vomiting. (Patient not taking: Reported on 05/31/2020) 60 tablet 2  . Phenylephrine HCl (NEO-SYNEPHRINE NA) Place 1 spray into the nose daily as needed (congestion).  (Patient not taking: Reported on 05/31/2020)    . silver sulfADIAZINE (SILVADENE) 1 % cream Apply 1 application topically daily. (Patient not taking: Reported on 05/31/2020) 50 g 0   No current facility-administered medications for this visit.   Facility-Administered Medications Ordered in Other Visits  Medication Dose Route Frequency Provider Last Rate Last Admin  . sodium chloride flush (NS) 0.9 % injection 10 mL  10 mL Intravenous PRN Grayland Ormond Kathlene November, MD  10 mL at 03/07/20 0836  . sodium chloride flush (NS) 0.9 % injection 10 mL  10 mL Intravenous PRN Lloyd Huger, MD   10 mL at 05/31/20 1259    OBJECTIVE: Vitals:   05/31/20 1306  BP: (!) 81/40  Pulse: 72  Resp: 18  Temp: 98.1 F (36.7 C)  SpO2: 100%     Body mass index is 26.47 kg/m.    ECOG FS:1 - Symptomatic but completely ambulatory  General: Well-developed, well-nourished, no acute distress. Eyes: Pink conjunctiva, anicteric sclera. HEENT: Normocephalic, moist mucous membranes. Lungs: No audible wheezing or coughing. Heart: Regular rate and rhythm. Abdomen: Soft, nontender, no obvious distention. Musculoskeletal: No edema, cyanosis, or clubbing. Neuro: Alert, answering all questions appropriately. Cranial nerves grossly intact. Skin: No rashes or petechiae noted. Psych: Normal affect.  LAB RESULTS:  Lab Results  Component Value Date   NA 135 05/31/2020   K 4.2 05/31/2020   CL 102 05/31/2020   CO2 27 05/31/2020   GLUCOSE 136 (H) 05/31/2020    BUN 7 (L) 05/31/2020   CREATININE 0.41 (L) 05/31/2020   CALCIUM 8.3 (L) 05/31/2020   PROT 5.0 (L) 05/31/2020   ALBUMIN 2.3 (L) 05/31/2020   AST 58 (H) 05/31/2020   ALT 39 05/31/2020   ALKPHOS 190 (H) 05/31/2020   BILITOT 0.8 05/31/2020   GFRNONAA >60 05/31/2020   GFRAA >60 03/14/2020    Lab Results  Component Value Date   WBC 5.4 05/31/2020   NEUTROABS 2.8 05/31/2020   HGB 10.5 (L) 05/31/2020   HCT 31.1 (L) 05/31/2020   MCV 102.6 (H) 05/31/2020   PLT 137 (L) 05/31/2020     STUDIES: No results found.  ASSESSMENT: Stage IIIb right colon cancer  PLAN:    1. Stage IIIb right colon cancer: Pathology results reviewed, confirming stage of disease.  CT scan results from Oct 28, 2019 reviewed independently  with no obvious evidence of metastatic disease.  Patient has now had port placement.  Patient only received 11 treatments of FOLFOX secondary to declining performance status completing on April 25, 2020.  No further intervention is needed at this time.  Will consider CT scan in May 2022.  Proceed with IV fluids today.  Return to clinic in 3 weeks for further evaluation. 2.  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  She completed 5 years of anastrozole in June 2016.  Her most recent mammogram 1 February 03, 2020 was reported as BI-RADS 1.  Repeat in August 2022.   3.  Anemia: Hemoglobin is 10.5 today.  Monitor. 4.  Neutropenia: Resolved. 5.  Hypokalemia: Resolved.  Continue oral potassium supplementation. 6.  Thrombocytopenia: Mild, monitor. 7.  Hypotension/dizziness: Improved.  Proceed with IV fluids today.  Further follow-up as above. 8.  Elevated LFTs: AST is only mildly elevated.  Monitor.  Patient expressed understanding and was in agreement with this plan. She also understands that She can call clinic at any time with any questions, concerns, or complaints.   Cancer Staging Colon cancer  [facility]) Staging form: Colon and Rectum, AJCC 8th Edition - Clinical stage from 11/13/2019: Stage IIIB (cT3, cN1a, cM0) - Signed by Lloyd Huger, MD on 11/13/2019   Lloyd Huger, MD   06/01/2020 6:40 AM

## 2020-05-30 DIAGNOSIS — E7800 Pure hypercholesterolemia, unspecified: Secondary | ICD-10-CM | POA: Diagnosis not present

## 2020-05-30 DIAGNOSIS — I350 Nonrheumatic aortic (valve) stenosis: Secondary | ICD-10-CM | POA: Diagnosis not present

## 2020-05-30 DIAGNOSIS — I10 Essential (primary) hypertension: Secondary | ICD-10-CM | POA: Diagnosis not present

## 2020-05-31 ENCOUNTER — Encounter: Payer: Self-pay | Admitting: Oncology

## 2020-05-31 ENCOUNTER — Inpatient Hospital Stay (HOSPITAL_BASED_OUTPATIENT_CLINIC_OR_DEPARTMENT_OTHER): Payer: Medicare Other | Admitting: Oncology

## 2020-05-31 ENCOUNTER — Inpatient Hospital Stay: Payer: Medicare Other

## 2020-05-31 ENCOUNTER — Inpatient Hospital Stay: Payer: Medicare Other | Attending: Oncology

## 2020-05-31 VITALS — BP 81/40 | HR 72 | Temp 98.1°F | Resp 18 | Wt 149.4 lb

## 2020-05-31 VITALS — BP 119/45 | HR 73

## 2020-05-31 DIAGNOSIS — R531 Weakness: Secondary | ICD-10-CM | POA: Diagnosis not present

## 2020-05-31 DIAGNOSIS — Z853 Personal history of malignant neoplasm of breast: Secondary | ICD-10-CM | POA: Insufficient documentation

## 2020-05-31 DIAGNOSIS — Z9221 Personal history of antineoplastic chemotherapy: Secondary | ICD-10-CM | POA: Diagnosis not present

## 2020-05-31 DIAGNOSIS — R948 Abnormal results of function studies of other organs and systems: Secondary | ICD-10-CM | POA: Diagnosis not present

## 2020-05-31 DIAGNOSIS — D509 Iron deficiency anemia, unspecified: Secondary | ICD-10-CM | POA: Diagnosis not present

## 2020-05-31 DIAGNOSIS — R42 Dizziness and giddiness: Secondary | ICD-10-CM | POA: Insufficient documentation

## 2020-05-31 DIAGNOSIS — R5383 Other fatigue: Secondary | ICD-10-CM | POA: Insufficient documentation

## 2020-05-31 DIAGNOSIS — Z923 Personal history of irradiation: Secondary | ICD-10-CM | POA: Diagnosis not present

## 2020-05-31 DIAGNOSIS — D696 Thrombocytopenia, unspecified: Secondary | ICD-10-CM | POA: Diagnosis not present

## 2020-05-31 DIAGNOSIS — Z79899 Other long term (current) drug therapy: Secondary | ICD-10-CM | POA: Diagnosis not present

## 2020-05-31 DIAGNOSIS — I10 Essential (primary) hypertension: Secondary | ICD-10-CM | POA: Insufficient documentation

## 2020-05-31 DIAGNOSIS — C182 Malignant neoplasm of ascending colon: Secondary | ICD-10-CM

## 2020-05-31 DIAGNOSIS — R630 Anorexia: Secondary | ICD-10-CM | POA: Insufficient documentation

## 2020-05-31 DIAGNOSIS — Z452 Encounter for adjustment and management of vascular access device: Secondary | ICD-10-CM

## 2020-05-31 DIAGNOSIS — Z800 Family history of malignant neoplasm of digestive organs: Secondary | ICD-10-CM | POA: Insufficient documentation

## 2020-05-31 DIAGNOSIS — Z803 Family history of malignant neoplasm of breast: Secondary | ICD-10-CM | POA: Diagnosis not present

## 2020-05-31 DIAGNOSIS — M8580 Other specified disorders of bone density and structure, unspecified site: Secondary | ICD-10-CM | POA: Diagnosis not present

## 2020-05-31 DIAGNOSIS — E039 Hypothyroidism, unspecified: Secondary | ICD-10-CM | POA: Insufficient documentation

## 2020-05-31 DIAGNOSIS — Z7982 Long term (current) use of aspirin: Secondary | ICD-10-CM | POA: Diagnosis not present

## 2020-05-31 LAB — COMPREHENSIVE METABOLIC PANEL
ALT: 39 U/L (ref 0–44)
AST: 58 U/L — ABNORMAL HIGH (ref 15–41)
Albumin: 2.3 g/dL — ABNORMAL LOW (ref 3.5–5.0)
Alkaline Phosphatase: 190 U/L — ABNORMAL HIGH (ref 38–126)
Anion gap: 6 (ref 5–15)
BUN: 7 mg/dL — ABNORMAL LOW (ref 8–23)
CO2: 27 mmol/L (ref 22–32)
Calcium: 8.3 mg/dL — ABNORMAL LOW (ref 8.9–10.3)
Chloride: 102 mmol/L (ref 98–111)
Creatinine, Ser: 0.41 mg/dL — ABNORMAL LOW (ref 0.44–1.00)
GFR, Estimated: >60 mL/min (ref >60)
Glucose, Bld: 136 mg/dL — ABNORMAL HIGH (ref 70–99)
Potassium: 4.2 mmol/L (ref 3.5–5.1)
Sodium: 135 mmol/L (ref 135–145)
Total Bilirubin: 0.8 mg/dL (ref 0.3–1.2)
Total Protein: 5 g/dL — ABNORMAL LOW (ref 6.5–8.1)

## 2020-05-31 LAB — CBC WITH DIFFERENTIAL/PLATELET
Abs Immature Granulocytes: 0.03 10*3/uL (ref 0.00–0.07)
Basophils Absolute: 0.1 10*3/uL (ref 0.0–0.1)
Basophils Relative: 1 %
Eosinophils Absolute: 0.2 10*3/uL (ref 0.0–0.5)
Eosinophils Relative: 3 %
HCT: 31.1 % — ABNORMAL LOW (ref 36.0–46.0)
Hemoglobin: 10.5 g/dL — ABNORMAL LOW (ref 12.0–15.0)
Immature Granulocytes: 1 %
Lymphocytes Relative: 32 %
Lymphs Abs: 1.7 10*3/uL (ref 0.7–4.0)
MCH: 34.7 pg — ABNORMAL HIGH (ref 26.0–34.0)
MCHC: 33.8 g/dL (ref 30.0–36.0)
MCV: 102.6 fL — ABNORMAL HIGH (ref 80.0–100.0)
Monocytes Absolute: 0.7 10*3/uL (ref 0.1–1.0)
Monocytes Relative: 13 %
Neutro Abs: 2.8 10*3/uL (ref 1.7–7.7)
Neutrophils Relative %: 50 %
Platelets: 137 10*3/uL — ABNORMAL LOW (ref 150–400)
RBC: 3.03 MIL/uL — ABNORMAL LOW (ref 3.87–5.11)
RDW: 16.5 % — ABNORMAL HIGH (ref 11.5–15.5)
WBC: 5.4 10*3/uL (ref 4.0–10.5)
nRBC: 0 % (ref 0.0–0.2)

## 2020-05-31 MED ORDER — SODIUM CHLORIDE 0.9 % IV SOLN
Freq: Once | INTRAVENOUS | Status: AC
  Filled 2020-05-31: qty 250

## 2020-05-31 MED ORDER — SODIUM CHLORIDE 0.9% FLUSH
10.0000 mL | INTRAVENOUS | Status: DC | PRN
  Administered 2020-05-31: 13:00:00 10 mL via INTRAVENOUS
  Filled 2020-05-31: qty 10

## 2020-05-31 MED ORDER — HEPARIN SOD (PORK) LOCK FLUSH 100 UNIT/ML IV SOLN
500.0000 [IU] | Freq: Once | INTRAVENOUS | Status: AC
  Administered 2020-05-31: 15:00:00 500 [IU] via INTRAVENOUS
  Filled 2020-05-31: qty 5

## 2020-06-06 DIAGNOSIS — E7800 Pure hypercholesterolemia, unspecified: Secondary | ICD-10-CM | POA: Diagnosis not present

## 2020-06-06 DIAGNOSIS — I350 Nonrheumatic aortic (valve) stenosis: Secondary | ICD-10-CM | POA: Diagnosis not present

## 2020-06-06 DIAGNOSIS — I10 Essential (primary) hypertension: Secondary | ICD-10-CM | POA: Diagnosis not present

## 2020-06-18 DIAGNOSIS — G610 Guillain-Barre syndrome: Secondary | ICD-10-CM

## 2020-06-18 DIAGNOSIS — Z85038 Personal history of other malignant neoplasm of large intestine: Secondary | ICD-10-CM | POA: Insufficient documentation

## 2020-06-18 DIAGNOSIS — Z950 Presence of cardiac pacemaker: Secondary | ICD-10-CM | POA: Insufficient documentation

## 2020-06-18 HISTORY — DX: Personal history of other malignant neoplasm of large intestine: Z85.038

## 2020-06-18 HISTORY — DX: Guillain-Barre syndrome: G61.0

## 2020-06-19 NOTE — Progress Notes (Signed)
[place]  [phone] [phone]) [phone]  ID: Maria Davies OB: Jul 25, 1940  MR#: [id number]  QIH#:[id number]  Patient Care Team: Valerie Roys, DO as PCP - General (Family Medicine) Guadalupe Maple, MD as PCP - Family Medicine (Family Medicine) Ronny Bacon, MD as Consulting Physician (General Surgery) Lloyd Huger, MD as Consulting Physician (Oncology)   CHIEF COMPLAINT: Stage IIIb right colon cancer.  INTERVAL HISTORY: Patient returns to clinic today for repeat laboratory work and further evaluation.  Her performance status is significantly improved and she feels nearly back to her baseline.  She does not complain of weakness or fatigue today.  She has a good appetite, but has lost weight in the interim.  She has no neurologic complaints.  She denies any recent fevers or illnesses.  She has no chest pain, shortness of breath, cough, or hemoptysis. She denies any vomiting, constipation, or diarrhea.  She has no melena or hematochezia.  She has no urinary complaints.  Patient offers no specific complaints today.  REVIEW OF SYSTEMS:   Review of Systems  Constitutional: Negative.  Negative for fever, malaise/fatigue and weight loss.  Respiratory: Negative.  Negative for cough, hemoptysis and shortness of breath.   Cardiovascular: Negative.  Negative for chest pain and leg swelling.  Gastrointestinal: Negative.  Negative for abdominal pain, blood in stool, melena and nausea.  Genitourinary: Negative.  Negative for hematuria.  Musculoskeletal: Negative.  Negative for back pain.  Skin: Negative.  Negative for rash.  Neurological: Negative.  Negative for dizziness, focal weakness, weakness and headaches.  Psychiatric/Behavioral: Negative.  The patient is not nervous/anxious.     As per HPI. Otherwise, a complete review of systems is negative.  PAST MEDICAL HISTORY: Past Medical History:  Diagnosis Date   Anemia    Arthritis    Basal  cell carcinoma    back   Breast cancer ([place]) 2010   RT LUMPECTOMY   Cancer ([place])    rt breast   Chemotherapy induced nausea and vomiting    Colon cancer ([place]) 2021   colon cancer   Heart murmur    Hypertension    Hypothyroidism    currently not on meds   Osteopenia    Personal history of chemotherapy 2010   right breast ca   Personal history of radiation therapy 2010   right breast ca   Port-A-Cath in place     PAST SURGICAL HISTORY: Past Surgical History:  Procedure Laterality Date   BACK SURGERY     BREAST EXCISIONAL BIOPSY Right 03/2009   radiation and chemo +   BREAST EXCISIONAL BIOPSY Bilateral    NEG   BREAST SURGERY     CATARACT EXTRACTION W/ INTRAOCULAR LENS  IMPLANT, BILATERAL     CHOLECYSTECTOMY     COLONOSCOPY WITH PROPOFOL N/A 10/19/2019   Procedure: COLONOSCOPY WITH PROPOFOL-attempted, unable to perform poor prep;  Surgeon: Lucilla Lame, MD;  Location: [facility];  Service: Endoscopy;  Laterality: N/A;  priority 3   COLONOSCOPY WITH PROPOFOL N/A 10/20/2019   Procedure: COLONOSCOPY WITH PROPOFOL;  Surgeon: Lucilla Lame, MD;  Location: [facility] ENDOSCOPY;  Service: Endoscopy;  Laterality: N/A;   ESOPHAGEAL DILATION  10/19/2019   Procedure: ESOPHAGEAL DILATION;  Surgeon: Lucilla Lame, MD;  Location: [facility];  Service: Endoscopy;;   ESOPHAGOGASTRODUODENOSCOPY (EGD) WITH PROPOFOL N/A 10/19/2019   Procedure: ESOPHAGOGASTRODUODENOSCOPY (EGD) WITH PROPOFOL;  Surgeon: Lucilla Lame, MD;  Location: [place];  Service: Endoscopy;  Laterality: N/A;   PORTACATH PLACEMENT  Right 11/23/2019   Procedure: INSERTION PORT-A-CATH;  Surgeon: Ronny Bacon, MD;  Location: [facility];  Service: General;  Laterality: Right;    FAMILY HISTORY: Family History  Problem Relation Age of Onset   Cancer Mother    Heart disease Father    Breast cancer Paternal Aunt 24    ADVANCED DIRECTIVES (Y/N):  N  HEALTH MAINTENANCE: Social History    Tobacco Use   Smoking status: Never Smoker   Smokeless tobacco: Never Used  Vaping Use   Vaping Use: Never used  Substance Use Topics   Alcohol use: Yes    Alcohol/week: 2.0 standard drinks    Types: 2 Standard drinks or equivalent per week    Comment: only on weeks not having treatment   Drug use: No     Colonoscopy:  PAP:  Bone density:  Lipid panel:  No Known Allergies  Current Outpatient Medications  Medication Sig Dispense Refill   acetaminophen (TYLENOL) 325 MG tablet Take 325 mg by mouth 3 (three) times daily as needed for moderate pain or headache.     amLODipine (NORVASC) 2.5 MG tablet Take 1 tablet (2.5 mg total) by mouth daily. 90 tablet 1   aspirin EC 81 MG tablet Take 81 mg by mouth daily.     benazepril (LOTENSIN) 40 MG tablet Take 1 tablet (40 mg total) by mouth daily. 90 tablet 1   lidocaine-prilocaine (EMLA) cream Apply to affected area once (Patient taking differently: Apply 1 application topically daily as needed (port access).) 30 g 3   methimazole (TAPAZOLE) 5 MG tablet Take 5 mg by mouth daily.      metoprolol succinate (TOPROL-XL) 50 MG 24 hr tablet Take 1 tablet (50 mg total) by mouth daily. Take with or immediately following a meal. 90 tablet 0   Multiple Vitamin (MULTI-VITAMINS) TABS Take 1 tablet by mouth daily.      potassium chloride SA (KLOR-CON) 20 MEQ tablet Take 1 tablet (20 mEq total) by mouth 2 (two) times daily. 60 tablet 2   Probiotic Product (PROBIOTIC ADVANCED PO) Take 1-2 capsules by mouth daily. When taking antibiotics pt will take 2 capsules instead of 1, normally just takes 1 per day otherwise     Cholecalciferol (VITAMIN D3) 50 MCG (2000 UT) capsule Take 4,000 Units by mouth daily.  (Patient not taking: Reported on 06/21/2020)     ondansetron (ZOFRAN) 8 MG tablet Take 1 tablet (8 mg total) by mouth 2 (two) times daily as needed for refractory nausea / vomiting. (Patient not taking: No sig reported) 60 tablet 2    Phenylephrine HCl (NEO-SYNEPHRINE NA) Place 1 spray into the nose daily as needed (congestion).  (Patient not taking: No sig reported)     prochlorperazine (COMPAZINE) 10 MG tablet Take 1 tablet (10 mg total) by mouth every 6 (six) hours as needed (Nausea or vomiting). (Patient not taking: Reported on 06/21/2020) 60 tablet 2   silver sulfADIAZINE (SILVADENE) 1 % cream Apply 1 application topically daily. (Patient not taking: No sig reported) 50 g 0   Sodium Hyaluronate, oral, (HYALURONIC ACID PO) Take 1 tablet by mouth daily.  (Patient not taking: Reported on 06/21/2020)     No current facility-administered medications for this visit.   Facility-Administered Medications Ordered in Other Visits  Medication Dose Route Frequency Provider Last Rate Last Admin   sodium chloride flush (NS) 0.9 % injection 10 mL  10 mL Intravenous PRN Lloyd Huger, MD   10 mL at 03/07/20 [phone]  sodium chloride flush (NS) 0.9 % injection 10 mL  10 mL Intravenous PRN Lloyd Huger, MD   10 mL at 06/21/20 1259    OBJECTIVE: Vitals:   06/21/20 1315  BP: (!) 115/51  Pulse: 75  Resp: 17  Temp: 97.6 F (36.4 C)     Body mass index is 25.85 kg/m.    ECOG FS:0 - Asymptomatic  General: Well-developed, well-nourished, no acute distress. Eyes: Pink conjunctiva, anicteric sclera. HEENT: Normocephalic, moist mucous membranes. Lungs: No audible wheezing or coughing. Heart: Regular rate and rhythm. Abdomen: Soft, nontender, no obvious distention. Musculoskeletal: No edema, cyanosis, or clubbing. Neuro: Alert, answering all questions appropriately. Cranial nerves grossly intact. Skin: No rashes or petechiae noted. Psych: Normal affect.  LAB RESULTS:  Lab Results  Component Value Date   NA 135 06/21/2020   K 3.7 06/21/2020   CL 105 06/21/2020   CO2 22 06/21/2020   GLUCOSE 120 (H) 06/21/2020   BUN 6 (L) 06/21/2020   CREATININE 0.35 (L) 06/21/2020   CALCIUM 8.2 (L) 06/21/2020   PROT 5.3 (L)  06/21/2020   ALBUMIN 2.6 (L) 06/21/2020   AST 39 06/21/2020   ALT 25 06/21/2020   ALKPHOS 137 (H) 06/21/2020   BILITOT 0.8 06/21/2020   GFRNONAA >60 06/21/2020   GFRAA >60 03/14/2020    Lab Results  Component Value Date   WBC 4.4 06/21/2020   NEUTROABS 2.1 06/21/2020   HGB 10.3 (L) 06/21/2020   HCT 30.3 (L) 06/21/2020   MCV 101.7 (H) 06/21/2020   PLT 115 (L) 06/21/2020     STUDIES: No results found.  ASSESSMENT: Stage IIIb right colon cancer  PLAN:    1. Stage IIIb right colon cancer: Pathology results reviewed, confirming stage of disease.  CT scan results from Oct 28, 2019 reviewed independently  with no obvious evidence of metastatic disease.  Patient has now had port placement.  Patient only received 11 treatments of FOLFOX secondary to declining performance status completing on April 25, 2020. Will consider CT scan in May 2022.  No intervention is needed at this time.  Return to clinic in 3 months with repeat laboratory work and further evaluation.   2.  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  She completed 5 years of anastrozole in June 2016.  Her most recent mammogram 1 February 03, 2020 was reported as BI-RADS 1.  Repeat in August 2022.   3.  Anemia: Chronic and unchanged.  Patient's hemoglobin is 10.3 today. 4.  Neutropenia: Resolved. 5.  Hypokalemia: Resolved.  Continue oral potassium supplementation. 6.  Thrombocytopenia: Mild, monitor.  Patient platelet count is 115 today. 7.  Hypotension/dizziness: Resolved. 8.  Elevated LFTs: Resolved. 9.  Heart disease: Patient states she may need a catheterization in the near future and her cardiologist at [facility] has recommended to remove the port prior to this procedure.  Patient has been instructed to call if this is necessary and then will send a referral to Dr. Christian Mate to remove port.  Patient expressed understanding and was in agreement  with this plan. She also understands that She can call clinic at any time with any questions, concerns, or complaints.   Cancer Staging Colon cancer [facility]) Staging form: Colon and Rectum, AJCC 8th Edition - Clinical stage from 11/13/2019: Stage IIIB (cT3, cN1a, cM0) - Signed by Lloyd Huger, MD on 11/13/2019   Lloyd Huger, MD   06/22/2020 6:35 AM

## 2020-06-21 ENCOUNTER — Inpatient Hospital Stay (HOSPITAL_BASED_OUTPATIENT_CLINIC_OR_DEPARTMENT_OTHER): Payer: Medicare Other | Admitting: Oncology

## 2020-06-21 ENCOUNTER — Other Ambulatory Visit: Payer: Self-pay

## 2020-06-21 ENCOUNTER — Inpatient Hospital Stay: Payer: Medicare Other | Attending: Oncology

## 2020-06-21 ENCOUNTER — Inpatient Hospital Stay: Payer: Medicare Other

## 2020-06-21 VITALS — BP 115/51 | HR 75 | Temp 97.6°F | Resp 17 | Wt 145.9 lb

## 2020-06-21 DIAGNOSIS — I519 Heart disease, unspecified: Secondary | ICD-10-CM | POA: Insufficient documentation

## 2020-06-21 DIAGNOSIS — Z170 Estrogen receptor positive status [ER+]: Secondary | ICD-10-CM | POA: Diagnosis not present

## 2020-06-21 DIAGNOSIS — D696 Thrombocytopenia, unspecified: Secondary | ICD-10-CM | POA: Diagnosis not present

## 2020-06-21 DIAGNOSIS — D649 Anemia, unspecified: Secondary | ICD-10-CM | POA: Diagnosis not present

## 2020-06-21 DIAGNOSIS — C182 Malignant neoplasm of ascending colon: Secondary | ICD-10-CM | POA: Insufficient documentation

## 2020-06-21 DIAGNOSIS — C50911 Malignant neoplasm of unspecified site of right female breast: Secondary | ICD-10-CM | POA: Diagnosis not present

## 2020-06-21 LAB — CBC WITH DIFFERENTIAL/PLATELET
Abs Immature Granulocytes: 0.01 10*3/uL (ref 0.00–0.07)
Basophils Absolute: 0 10*3/uL (ref 0.0–0.1)
Basophils Relative: 1 %
Eosinophils Absolute: 0.1 10*3/uL (ref 0.0–0.5)
Eosinophils Relative: 3 %
HCT: 30.3 % — ABNORMAL LOW (ref 36.0–46.0)
Hemoglobin: 10.3 g/dL — ABNORMAL LOW (ref 12.0–15.0)
Immature Granulocytes: 0 %
Lymphocytes Relative: 36 %
Lymphs Abs: 1.6 10*3/uL (ref 0.7–4.0)
MCH: 34.6 pg — ABNORMAL HIGH (ref 26.0–34.0)
MCHC: 34 g/dL (ref 30.0–36.0)
MCV: 101.7 fL — ABNORMAL HIGH (ref 80.0–100.0)
Monocytes Absolute: 0.5 10*3/uL (ref 0.1–1.0)
Monocytes Relative: 12 %
Neutro Abs: 2.1 10*3/uL (ref 1.7–7.7)
Neutrophils Relative %: 48 %
Platelets: 115 10*3/uL — ABNORMAL LOW (ref 150–400)
RBC: 2.98 MIL/uL — ABNORMAL LOW (ref 3.87–5.11)
RDW: 14.6 % (ref 11.5–15.5)
WBC: 4.4 10*3/uL (ref 4.0–10.5)
nRBC: 0 % (ref 0.0–0.2)

## 2020-06-21 LAB — COMPREHENSIVE METABOLIC PANEL
ALT: 25 U/L (ref 0–44)
AST: 39 U/L (ref 15–41)
Albumin: 2.6 g/dL — ABNORMAL LOW (ref 3.5–5.0)
Alkaline Phosphatase: 137 U/L — ABNORMAL HIGH (ref 38–126)
Anion gap: 8 (ref 5–15)
BUN: 6 mg/dL — ABNORMAL LOW (ref 8–23)
CO2: 22 mmol/L (ref 22–32)
Calcium: 8.2 mg/dL — ABNORMAL LOW (ref 8.9–10.3)
Chloride: 105 mmol/L (ref 98–111)
Creatinine, Ser: 0.35 mg/dL — ABNORMAL LOW (ref 0.44–1.00)
GFR, Estimated: >60 mL/min (ref >60)
Glucose, Bld: 120 mg/dL — ABNORMAL HIGH (ref 70–99)
Potassium: 3.7 mmol/L (ref 3.5–5.1)
Sodium: 135 mmol/L (ref 135–145)
Total Bilirubin: 0.8 mg/dL (ref 0.3–1.2)
Total Protein: 5.3 g/dL — ABNORMAL LOW (ref 6.5–8.1)

## 2020-06-21 MED ORDER — SODIUM CHLORIDE 0.9% FLUSH
10.0000 mL | INTRAVENOUS | Status: DC | PRN
  Administered 2020-06-21: 10 mL via INTRAVENOUS
  Filled 2020-06-21: qty 10

## 2020-06-21 MED ORDER — HEPARIN SOD (PORK) LOCK FLUSH 100 UNIT/ML IV SOLN
500.0000 [IU] | Freq: Once | INTRAVENOUS | Status: AC
  Administered 2020-06-21: 500 [IU] via INTRAVENOUS
  Filled 2020-06-21: qty 5

## 2020-06-21 NOTE — Progress Notes (Signed)
Reports feeling "much better". No complaints at all today.

## 2020-07-08 ENCOUNTER — Telehealth: Payer: Self-pay | Admitting: *Deleted

## 2020-07-08 NOTE — Telephone Encounter (Signed)
Patient called reporting that she was started on K+ a few months ago when she was not eating or drinking much and she has refills on her prescription, but wants to know if she still needs to take it since she is now eating and drinking normally. Please advise  Component Ref Range & Units 2 wk ago  (06/21/20) 1 mo ago  (05/31/20) 1 mo ago  (05/11/20) 2 mo ago  (05/09/20) 2 mo ago  (04/25/20) 2 mo ago  (04/11/20) 3 mo ago  (03/28/20)  Potassium 3.5 - 5.1 mmol/L 3.7  4.2  2.5

## 2020-07-08 NOTE — Telephone Encounter (Signed)
Call returned to patient and informed of doctor response. She thanked me for letting her know and will be sure to eat plenty of potassium rich foods

## 2020-07-08 NOTE — Telephone Encounter (Signed)
No she can stop.  Just make sure she is getting k+ in here diet.

## 2020-07-16 ENCOUNTER — Other Ambulatory Visit: Payer: Self-pay | Admitting: Family Medicine

## 2020-07-16 DIAGNOSIS — I10 Essential (primary) hypertension: Secondary | ICD-10-CM

## 2020-07-16 NOTE — Telephone Encounter (Signed)
Requested Prescriptions  Pending Prescriptions Disp Refills  . metoprolol succinate (TOPROL-XL) 50 MG 24 hr tablet [Pharmacy Med Name: METOPROLOL SUCCINATE ER 50 MG TAB] 90 tablet 0    Sig: TAKE 1 TABLET BY MOUTH ONCE DAILY     Cardiovascular:  Beta Blockers Passed - 07/16/2020  9:40 AM      Passed - Last BP in normal range    BP Readings from Last 1 Encounters:  06/21/20 (!) 115/51         Passed - Last Heart Rate in normal range    Pulse Readings from Last 1 Encounters:  06/21/20 75         Passed - Valid encounter within last 6 months    Recent Outpatient Visits          5 months ago Hypertension, unspecified type   [facility], Megan P, DO   11 months ago Hypertension, unspecified type   [place], Megan P, DO   1 year ago Hypertension, unspecified type   [facility], [place]   1 year ago Aortic stenosis, mild   [facility] [place], Jeannette How, MD   1 year ago Hypertension, unspecified type   [place], [place]      Future Appointments            In 3 weeks  [place], [place]   In 1 month Johnson, Megan P, DO [employer]

## 2020-07-25 ENCOUNTER — Telehealth: Payer: Self-pay | Admitting: Surgery

## 2020-07-25 DIAGNOSIS — C801 Malignant (primary) neoplasm, unspecified: Secondary | ICD-10-CM | POA: Diagnosis not present

## 2020-07-25 DIAGNOSIS — I10 Essential (primary) hypertension: Secondary | ICD-10-CM | POA: Diagnosis not present

## 2020-07-25 DIAGNOSIS — I350 Nonrheumatic aortic (valve) stenosis: Secondary | ICD-10-CM | POA: Diagnosis not present

## 2020-07-25 DIAGNOSIS — R918 Other nonspecific abnormal finding of lung field: Secondary | ICD-10-CM | POA: Diagnosis not present

## 2020-07-25 DIAGNOSIS — R0602 Shortness of breath: Secondary | ICD-10-CM | POA: Diagnosis not present

## 2020-07-25 DIAGNOSIS — Z952 Presence of prosthetic heart valve: Secondary | ICD-10-CM | POA: Diagnosis not present

## 2020-07-25 DIAGNOSIS — E7800 Pure hypercholesterolemia, unspecified: Secondary | ICD-10-CM | POA: Diagnosis not present

## 2020-07-25 DIAGNOSIS — Z01810 Encounter for preprocedural cardiovascular examination: Secondary | ICD-10-CM | POA: Diagnosis not present

## 2020-07-25 NOTE — Telephone Encounter (Signed)
Pt called to schedule her port removal w/Dr. Christian Mate on 08/02/20; however, she takes a daily baby asprin (81 mg) & was unsure if she would need to d/c prior to this appt.  Please advise - Thank you

## 2020-07-28 ENCOUNTER — Telehealth: Payer: Self-pay

## 2020-07-28 NOTE — Telephone Encounter (Signed)
Per Dr.Rodenberg-no need to stop the Aspirin.

## 2020-07-28 NOTE — Telephone Encounter (Signed)
Incoming call from the patient.  She is scheduled for port removal on 08/02/20 with Dr Christian Mate and needs to know if she has to stop her aspirin.  Please call her.  Thank you.

## 2020-08-02 ENCOUNTER — Other Ambulatory Visit: Payer: Self-pay

## 2020-08-02 ENCOUNTER — Encounter: Payer: Self-pay | Admitting: Surgery

## 2020-08-02 ENCOUNTER — Ambulatory Visit (INDEPENDENT_AMBULATORY_CARE_PROVIDER_SITE_OTHER): Payer: Medicare Other | Admitting: Surgery

## 2020-08-02 VITALS — BP 124/64 | HR 89 | Temp 89.0°F | Ht 63.0 in | Wt 145.6 lb

## 2020-08-02 DIAGNOSIS — Z95828 Presence of other vascular implants and grafts: Secondary | ICD-10-CM

## 2020-08-02 NOTE — Patient Instructions (Signed)
Please wait to shower for 24 hours. Do not scrub over the area. You may let soapy water run over the area and pat dry. Do not apply any creams or ointments to the area. Call our office with any questions or concerns that you may have.     Implanted Port Removal Implanted port removal is a procedure to remove the port and catheter (port-a-cath) that is implanted under your skin. The port is a small disc under your skin that can be punctured with a needle. It is connected to a vein in your chest or neck by a small flexible tube (catheter). The port-a-cath is used for treatment through an IV tube and for taking blood samples. Your health care provider will remove the port-a-cath if:  You no longer need it for treatment.  It is not working properly.  The area around it gets infected.  Tell a health care provider about:  Any allergies you have.  All medicines you are taking, including vitamins, herbs, eye drops, creams, and over-the-counter medicines.  Any problems you or family members have had with anesthetic medicines.  Any blood disorders you have.  Any surgeries you have had.  Any medical conditions you have.  Whether you are pregnant or may be pregnant. What are the risks? Generally, this is a safe procedure. However, problems may occur, including:  Infection.  Bleeding.  Allergic reactions to anesthetic medicines.  Damage to nerves or blood vessels.  What happens before the procedure?  You will have: ? A physical exam. ? Blood tests. ? Imaging tests, including a chest X-ray.  Follow instructions from your health care provider about eating or drinking restrictions.  Ask your health care provider about: ? Changing or stopping your regular medicines. This is especially important if you are taking diabetes medicines or blood thinners. ? Taking medicines such as aspirin and ibuprofen. These medicines can thin your blood. Do not take these medicines before your  procedure if your surgeon instructs you not to.  Ask your health care provider how your surgical site will be marked or identified.  You may be given antibiotic medicine to help prevent infection.  Plan to have someone take you home after the procedure.  If you will be going home right after the procedure, plan to have someone stay with you for 24 hours. What happens during the procedure?  To reduce your risk of infection: ? Your health care team will wash or sanitize their hands. ? Your skin will be washed with soap.  You may be given one or more of the following: ? A medicine to help you relax (sedative). ? A medicine to numb the area (local anesthetic).  A small cut (incision) will be made at the site of your port-a-cath.  The port-a-cath and the catheter that has been inside your vein will gently be removed.  The incision will be closed with stitches (sutures), adhesive strips, or skin glue.  A bandage (dressing) will be placed over the incision. The procedure may vary among health care providers and hospitals. What happens after the procedure?  Your blood pressure, heart rate, breathing rate, and blood oxygen level will be monitored often until the medicines you were given have worn off.  Do not drive for 24 hours if you received a sedative. This information is not intended to replace advice given to you by your health care provider. Make sure you discuss any questions you have with your health care provider. Document Released: 05/16/2015 Document Revised:  11/10/2015 Document Reviewed: 03/09/2015 Elsevier Interactive Patient Education  [employer].

## 2020-08-02 NOTE — Progress Notes (Signed)
SURGICAL OPERATIVE REPORT  DATE OF PROCEDURE: 08/02/2020  ANESTHESIA: Local  PRE-OPERATIVE DIAGNOSIS: Right subclavian central venous catheter with subcutaneous port, no longer needed (icd-10: I29.798)  POST-OPERATIVE DIAGNOSIS:  Same  PROCEDURE(S):  1.) Removal of indwelling right subclavian central venous catheter with subcutaneous port (cpt: 36590)  INTRAOPERATIVE FINDINGS: Well-incorporated right  subclavian central venous catheter with subcutaneous port without erythema or purulence  INTRAVENOUS FLUIDS: 0 mL crystalloid   ESTIMATED BLOOD LOSS: Minimal (< 5 mL)  SPECIMEN/EXPLANT: PAC with catheter.   DRAINS: none  COMPLICATIONS: None apparent  CONDITION AT END OF PROCEDURE: Hemodynamically stable and awake  DISPOSITION OF PATIENT: Home.  INDICATIONS FOR PROCEDURE:  Patient is s/p complete utilization of a right subclavian central venous catheter with indwelling subcutaneous port presents for evaluation and removal of her no longer needed port. Patient reports the port was never infected and always functioned properly, but since it's reportedly no longer needed, and  wishes to have it removed. All risks, benefits, and alternatives to above procedure were discussed with the patient, all of patient's questions were answered to his expressed satisfaction, and informed consent was obtained and documented.  DETAILS OF PROCEDURE: Patient was brought to the procedure suite and appropriately identified. In supine position, operative site was prepped and draped in the usual sterile fashion, and following a brief time out, lidocaine w/ Epi  was injected in the subcutaneous tissue overlying the port, a 2 cm incision was made through the previous scar to accommodate the port. A combination of blunt dissection, sharp dissection  were used to free the port and attached catheter from its surrounding capsule and tissues. The port was then able to be removed from its subcutaneous pocket, the  catheter was confirmed to slide freely, and the catheter was removed with immediate focal pressure applied to the venous insertion site for hemostasis. Hemostasis was then confirmed, the subcutaneous pocket was evaluated to ensure all/any previous used sutures were removed, and the wound was then closed in layers, using 3-0 Vicryl buried interrupted suture to re-approximate dermis. Skin was then cleaned, dried, and sterile Dermabond was applied and allowed to dry.  Ronny Bacon, M.D., [facility]  08/02/2020 ; 1:57 PM

## 2020-08-04 NOTE — Telephone Encounter (Signed)
Documented w/in a separate telephone note that there is no need to d/c Asprin prior to her port removal.  Encounter is being closed.

## 2020-08-08 ENCOUNTER — Ambulatory Visit (INDEPENDENT_AMBULATORY_CARE_PROVIDER_SITE_OTHER): Payer: Medicare Other | Admitting: Surgery

## 2020-08-08 ENCOUNTER — Other Ambulatory Visit: Payer: Self-pay

## 2020-08-08 VITALS — BP 121/78 | HR 83 | Temp 98.6°F | Resp 98 | Ht 63.0 in | Wt 146.8 lb

## 2020-08-08 DIAGNOSIS — Z09 Encounter for follow-up examination after completed treatment for conditions other than malignant neoplasm: Secondary | ICD-10-CM

## 2020-08-08 DIAGNOSIS — Z95828 Presence of other vascular implants and grafts: Secondary | ICD-10-CM

## 2020-08-08 NOTE — Patient Instructions (Signed)
Apply an ice pack throughout the next few days. You may continue with your Aspirin. Please call our office if you have questions or concerns.

## 2020-08-08 NOTE — Progress Notes (Addendum)
08/08/2020  HPI: Maria Davies is a 80 y.o. female s/p port-a-cath removal by Dr. Christian Mate on 08/02/20.  She presents today for follow up.  She called this morning because she noticed that the same night of the procedure, she started getting swelling at the surgical site.  Over the course of the weekend, the area had bruising and swelling.  She wanted to make sure that it was not getting infected.  Denies any drainage or worsening pain.  Vital signs: BP 121/78   Pulse 83   Temp 98.6 F (37 C) (Oral)   Resp (!) 98   Ht 5\' 3"  (1.6 m)   Wt 146 lb 12.8 oz (66.6 kg)   BMI 26.00 kg/m    Physical Exam: Constitutional:   No acute distress Skin:  Right infraclavicular incision is healing well, clean, dry, intact.  There is fluctuance and ecchymosis at the surgical site and cavity where port was located, consistent with a hematoma.  There is also resolving ecchymosis inferiorly that extends for about 7-8 cm.  There is no erythema or induration.  Assessment/Plan: This is a 80 y.o. female s/p port-a-cath removal, with resultant hematoma and ecchymosis.  --Discussed with the patient that likely she had some bleeding that night of the procedure and this resulted in the hematoma and ecchymosis.  The incision is not under tension and there is no sign of infection.  This episode is resolving currently. --No antibiotics are needed.  Recommended that she can use ice pack to help decrease the swelling of the area. --She may continue taking her ASA.  She's getting cardiac catheterization on 2/24 and I do not see any contraindication to that at this point. --Follow as needed.   Melvyn Neth, [facility]

## 2020-08-10 ENCOUNTER — Ambulatory Visit: Payer: Medicare Other

## 2020-08-11 DIAGNOSIS — I350 Nonrheumatic aortic (valve) stenosis: Secondary | ICD-10-CM | POA: Diagnosis not present

## 2020-08-11 DIAGNOSIS — R5382 Chronic fatigue, unspecified: Secondary | ICD-10-CM | POA: Diagnosis not present

## 2020-08-11 DIAGNOSIS — I10 Essential (primary) hypertension: Secondary | ICD-10-CM | POA: Diagnosis not present

## 2020-08-11 DIAGNOSIS — D649 Anemia, unspecified: Secondary | ICD-10-CM | POA: Diagnosis not present

## 2020-08-11 DIAGNOSIS — I452 Bifascicular block: Secondary | ICD-10-CM | POA: Diagnosis not present

## 2020-08-11 DIAGNOSIS — I2510 Atherosclerotic heart disease of native coronary artery without angina pectoris: Secondary | ICD-10-CM | POA: Diagnosis not present

## 2020-08-11 DIAGNOSIS — R5383 Other fatigue: Secondary | ICD-10-CM | POA: Diagnosis not present

## 2020-08-11 DIAGNOSIS — R000 Tachycardia, unspecified: Secondary | ICD-10-CM | POA: Diagnosis not present

## 2020-08-11 DIAGNOSIS — Z01810 Encounter for preprocedural cardiovascular examination: Secondary | ICD-10-CM | POA: Diagnosis not present

## 2020-08-11 DIAGNOSIS — E785 Hyperlipidemia, unspecified: Secondary | ICD-10-CM | POA: Diagnosis not present

## 2020-08-11 DIAGNOSIS — R0609 Other forms of dyspnea: Secondary | ICD-10-CM | POA: Diagnosis not present

## 2020-08-11 DIAGNOSIS — Z79899 Other long term (current) drug therapy: Secondary | ICD-10-CM | POA: Diagnosis not present

## 2020-08-11 HISTORY — PX: RIGHT/LEFT HEART CATH AND CORONARY ANGIOGRAPHY: CATH118266

## 2020-08-11 HISTORY — PX: OTHER SURGICAL HISTORY: SHX169

## 2020-08-15 ENCOUNTER — Other Ambulatory Visit: Payer: Self-pay

## 2020-08-15 ENCOUNTER — Encounter: Payer: Self-pay | Admitting: Family Medicine

## 2020-08-15 ENCOUNTER — Ambulatory Visit (INDEPENDENT_AMBULATORY_CARE_PROVIDER_SITE_OTHER): Payer: Medicare Other | Admitting: Family Medicine

## 2020-08-15 VITALS — BP 123/75 | HR 87 | Temp 97.7°F | Wt 146.6 lb

## 2020-08-15 DIAGNOSIS — D500 Iron deficiency anemia secondary to blood loss (chronic): Secondary | ICD-10-CM

## 2020-08-15 DIAGNOSIS — C182 Malignant neoplasm of ascending colon: Secondary | ICD-10-CM

## 2020-08-15 DIAGNOSIS — R7303 Prediabetes: Secondary | ICD-10-CM | POA: Diagnosis not present

## 2020-08-15 DIAGNOSIS — I10 Essential (primary) hypertension: Secondary | ICD-10-CM | POA: Diagnosis not present

## 2020-08-15 DIAGNOSIS — E7800 Pure hypercholesterolemia, unspecified: Secondary | ICD-10-CM

## 2020-08-15 DIAGNOSIS — I350 Nonrheumatic aortic (valve) stenosis: Secondary | ICD-10-CM | POA: Diagnosis not present

## 2020-08-15 DIAGNOSIS — C50211 Malignant neoplasm of upper-inner quadrant of right female breast: Secondary | ICD-10-CM | POA: Diagnosis not present

## 2020-08-15 DIAGNOSIS — E0590 Thyrotoxicosis, unspecified without thyrotoxic crisis or storm: Secondary | ICD-10-CM | POA: Diagnosis not present

## 2020-08-15 LAB — BAYER DCA HB A1C WAIVED: HB A1C (BAYER DCA - WAIVED): 4.5 % (ref <7.0)

## 2020-08-15 MED ORDER — AMLODIPINE BESYLATE 2.5 MG PO TABS: 2.5000 mg | ORAL_TABLET | Freq: Every day | ORAL | 1 refills | Status: DC

## 2020-08-15 MED ORDER — METOPROLOL SUCCINATE ER 50 MG PO TB24: 50.0000 mg | ORAL_TABLET | Freq: Every day | ORAL | 1 refills | Status: DC

## 2020-08-15 MED ORDER — BENAZEPRIL HCL 40 MG PO TABS: 40.0000 mg | ORAL_TABLET | Freq: Every day | ORAL | 1 refills | Status: DC

## 2020-08-15 NOTE — Assessment & Plan Note (Signed)
Rechecking labs today. Await results. Treat as needed.  °

## 2020-08-15 NOTE — Assessment & Plan Note (Signed)
Stable. Continue to follow with oncology. Call with any concerns. Continue to monitor.  

## 2020-08-15 NOTE — Progress Notes (Signed)
BP 123/75   Pulse 87   Temp 97.7 F (36.5 C)   Wt 146 lb 9.6 oz (66.5 kg)   SpO2 98%   BMI 25.97 kg/m    Subjective:    Patient ID: Maria Davies, female    DOB: [birth year], 80 y.o.   MRN: [id number]  HPI: Maria Davies is a 80 y.o. female  Chief Complaint  Patient presents with  . Hypertension  . prediabetic  . Hyperthyroidism   HYPERTENSION / HYPERLIPIDEMIA Satisfied with current treatment? yes Duration of hypertension: chronic BP monitoring frequency: a few times a month BP medication side effects: no Past BP meds: benazepril, metoprolol, amlodipine Duration of hyperlipidemia: chronic Cholesterol medication side effects: not on anything Cholesterol supplements: none Past cholesterol medications: none Medication compliance: excellent compliance Aspirin: yes Recent stressors: yes Recurrent headaches: no Visual changes: no Palpitations: no Dyspnea: no Chest pain: no Lower extremity edema: no Dizzy/lightheaded: no  HYPERTHYROIDISM Thyroid control status:stable Satisfied with current treatment? yes Medication side effects: no Medication compliance: excellent compliance Etiology of hypothyroidism:  Recent dose adjustment:no Fatigue: no Cold intolerance: no Heat intolerance: no Weight gain: no Weight loss: no Constipation: no Diarrhea/loose stools: no Palpitations: no Lower extremity edema: no Anxiety/depressed mood: no  Impaired Fasting Glucose HbA1C:  Lab Results  Component Value Date   HGBA1C 4.8 02/11/2020   Duration of elevated blood sugar: chronic- has been down with cancer Polydipsia: no Polyuria: no Weight change: no Visual disturbance: no Glucose Monitoring: no    Accucheck frequency: Not Checking Diabetic Education: Not Completed  Relevant past medical, surgical, family and social history reviewed and updated as indicated. Interim medical history since our last visit reviewed. Allergies and medications reviewed and  updated.  Review of Systems  Constitutional: Negative.   Respiratory: Negative.   Cardiovascular: Negative.   Gastrointestinal: Negative.   Musculoskeletal: Negative.   Neurological: Negative.   Psychiatric/Behavioral: Negative.     Per HPI unless specifically indicated above     Objective:    BP 123/75   Pulse 87   Temp 97.7 F (36.5 C)   Wt 146 lb 9.6 oz (66.5 kg)   SpO2 98%   BMI 25.97 kg/m   Wt Readings from Last 3 Encounters:  08/15/20 146 lb 9.6 oz (66.5 kg)  08/08/20 146 lb 12.8 oz (66.6 kg)  08/02/20 145 lb 9.6 oz (66 kg)    Physical Exam Vitals and nursing note reviewed.  Constitutional:      General: She is not in acute distress.    Appearance: Normal appearance. She is not ill-appearing, toxic-appearing or diaphoretic.  HENT:     Head: Normocephalic and atraumatic.     Right Ear: External ear normal.     Left Ear: External ear normal.     Nose: Nose normal.     Mouth/Throat:     Mouth: Mucous membranes are moist.     Pharynx: Oropharynx is clear.  Eyes:     General: No scleral icterus.       Right eye: No discharge.        Left eye: No discharge.     Extraocular Movements: Extraocular movements intact.     Conjunctiva/sclera: Conjunctivae normal.     Pupils: Pupils are equal, round, and reactive to light.  Cardiovascular:     Rate and Rhythm: Normal rate and regular rhythm.     Pulses: Normal pulses.     Heart sounds: Murmur heard.  No friction rub. No gallop.   Pulmonary:  Effort: Pulmonary effort is normal. No respiratory distress.     Breath sounds: Normal breath sounds. No stridor. No wheezing, rhonchi or rales.  Chest:     Chest wall: No tenderness.  Musculoskeletal:        General: Normal range of motion.     Cervical back: Normal range of motion and neck supple.  Skin:    General: Skin is warm and dry.     Capillary Refill: Capillary refill takes less than 2 seconds.     Coloration: Skin is not jaundiced or pale.     Findings: No  bruising, erythema, lesion or rash.     Comments: Hematoma on R upper chest where port was removed  Neurological:     General: No focal deficit present.     Mental Status: She is alert and oriented to person, place, and time. Mental status is at baseline.  Psychiatric:        Mood and Affect: Mood normal.        Behavior: Behavior normal.        Thought Content: Thought content normal.        Judgment: Judgment normal.     Results for orders placed or performed in visit on 06/21/20  Comprehensive metabolic panel  Result Value Ref Range   Sodium 135 135 - 145 mmol/L   Potassium 3.7 3.5 - 5.1 mmol/L   Chloride 105 98 - 111 mmol/L   CO2 22 22 - 32 mmol/L   Glucose, Bld 120 (H) 70 - 99 mg/dL   BUN 6 (L) 8 - 23 mg/dL   Creatinine, Ser 0.35 (L) 0.44 - 1.00 mg/dL   Calcium 8.2 (L) 8.9 - 10.3 mg/dL   Total Protein 5.3 (L) 6.5 - 8.1 g/dL   Albumin 2.6 (L) 3.5 - 5.0 g/dL   AST 39 15 - 41 U/L   ALT 25 0 - 44 U/L   Alkaline Phosphatase 137 (H) 38 - 126 U/L   Total Bilirubin 0.8 0.3 - 1.2 mg/dL   GFR, Estimated >60 >60 mL/min   Anion gap 8 5 - 15  CBC with Differential  Result Value Ref Range   WBC 4.4 4.0 - 10.5 K/uL   RBC 2.98 (L) 3.87 - 5.11 MIL/uL   Hemoglobin 10.3 (L) 12.0 - 15.0 g/dL   HCT 30.3 (L) 36.0 - 46.0 %   MCV 101.7 (H) 80.0 - 100.0 fL   MCH 34.6 (H) 26.0 - 34.0 pg   MCHC 34.0 30.0 - 36.0 g/dL   RDW 14.6 11.5 - 15.5 %   Platelets 115 (L) 150 - 400 K/uL   nRBC 0.0 0.0 - 0.2 %   Neutrophils Relative % 48 %   Neutro Abs 2.1 1.7 - 7.7 K/uL   Lymphocytes Relative 36 %   Lymphs Abs 1.6 0.7 - 4.0 K/uL   Monocytes Relative 12 %   Monocytes Absolute 0.5 0.1 - 1.0 K/uL   Eosinophils Relative 3 %   Eosinophils Absolute 0.1 0.0 - 0.5 K/uL   Basophils Relative 1 %   Basophils Absolute 0.0 0.0 - 0.1 K/uL   Immature Granulocytes 0 %   Abs Immature Granulocytes 0.01 0.00 - 0.07 K/uL      Assessment & Plan:   Problem List Items Addressed This Visit      Cardiovascular  and Mediastinum   Aortic stenosis, mild    To have replacement done in March. Call with any concerns. Continue to follow with cardiology.  Relevant Medications   amLODipine (NORVASC) 2.5 MG tablet   benazepril (LOTENSIN) 40 MG tablet   metoprolol succinate (TOPROL-XL) 50 MG 24 hr tablet   Hypertension - Primary    Under good control on current regimen. Continue current regimen. Continue to monitor. Call with any concerns. Refills given. Labs drawn today.        Relevant Medications   amLODipine (NORVASC) 2.5 MG tablet   benazepril (LOTENSIN) 40 MG tablet   metoprolol succinate (TOPROL-XL) 50 MG 24 hr tablet   Other Relevant Orders   CBC with Differential/Platelet   Comprehensive metabolic panel     Digestive   Colon cancer [facility])    Following with Oncology. Doing well. Continue to monitor. Call with any concerns.       Relevant Orders   CBC with Differential/Platelet   Comprehensive metabolic panel     Endocrine   Hyperthyroidism    Following with endocrinology. Stable. Labs drawn today. Call with any concerns. Continue to monitor.       Relevant Medications   metoprolol succinate (TOPROL-XL) 50 MG 24 hr tablet   Other Relevant Orders   CBC with Differential/Platelet   Comprehensive metabolic panel   TSH     Other   Breast cancer in female ([place])    Stable. Continue to follow with oncology. Call with any concerns. Continue to monitor.       Hypercholesteremia    Rechecking labs today. Await results. Treat as needed.       Relevant Medications   amLODipine (NORVASC) 2.5 MG tablet   benazepril (LOTENSIN) 40 MG tablet   metoprolol succinate (TOPROL-XL) 50 MG 24 hr tablet   Other Relevant Orders   CBC with Differential/Platelet   Comprehensive metabolic panel   Lipid Panel w/o Chol/HDL Ratio   Pre-diabetes    Rechecking labs today. Await results. Treat as needed.       Relevant Orders   CBC with Differential/Platelet   Bayer DCA Hb A1c Waived    Comprehensive metabolic panel   Iron deficiency anemia secondary to blood loss (chronic)    Rechecking labs today. Await results. Treat as needed.           Follow up plan: Return in about 6 months (around 02/12/2021).

## 2020-08-15 NOTE — Assessment & Plan Note (Signed)
Under good control on current regimen. Continue current regimen. Continue to monitor. Call with any concerns. Refills given. Labs drawn today.   

## 2020-08-15 NOTE — Assessment & Plan Note (Signed)
Following with Oncology. Doing well. Continue to monitor. Call with any concerns.

## 2020-08-15 NOTE — Assessment & Plan Note (Signed)
Following with endocrinology. Stable. Labs drawn today. Call with any concerns. Continue to monitor.

## 2020-08-15 NOTE — Assessment & Plan Note (Signed)
To have replacement done in March. Call with any concerns. Continue to follow with cardiology.

## 2020-08-16 LAB — CBC WITH DIFFERENTIAL/PLATELET
Basophils Absolute: 0.1 10*3/uL (ref 0.0–0.2)
Basos: 1 %
EOS (ABSOLUTE): 0.2 10*3/uL (ref 0.0–0.4)
Eos: 4 %
Hematocrit: 31 % — ABNORMAL LOW (ref 34.0–46.6)
Hemoglobin: 10.6 g/dL — ABNORMAL LOW (ref 11.1–15.9)
Immature Grans (Abs): 0 10*3/uL (ref 0.0–0.1)
Immature Granulocytes: 0 %
Lymphocytes Absolute: 1.2 10*3/uL (ref 0.7–3.1)
Lymphs: 28 %
MCH: 33.1 pg — ABNORMAL HIGH (ref 26.6–33.0)
MCHC: 34.2 g/dL (ref 31.5–35.7)
MCV: 97 fL (ref 79–97)
Monocytes Absolute: 0.6 10*3/uL (ref 0.1–0.9)
Monocytes: 14 %
Neutrophils Absolute: 2.3 10*3/uL (ref 1.4–7.0)
Neutrophils: 53 %
Platelets: 133 10*3/uL — ABNORMAL LOW (ref 150–450)
RBC: 3.2 x10E6/uL — ABNORMAL LOW (ref 3.77–5.28)
RDW: 12 % (ref 11.7–15.4)
WBC: 4.4 10*3/uL (ref 3.4–10.8)

## 2020-08-16 LAB — LIPID PANEL W/O CHOL/HDL RATIO
Cholesterol, Total: 167 mg/dL (ref 100–199)
HDL: 91 mg/dL (ref >39)
LDL Chol Calc (NIH): 63 mg/dL (ref 0–99)
Triglycerides: 65 mg/dL (ref 0–149)
VLDL Cholesterol Cal: 13 mg/dL (ref 5–40)

## 2020-08-16 LAB — COMPREHENSIVE METABOLIC PANEL
ALT: 15 IU/L (ref 0–32)
AST: 23 IU/L (ref 0–40)
Albumin/Globulin Ratio: 1.3 (ref 1.2–2.2)
Albumin: 3.6 g/dL — ABNORMAL LOW (ref 3.7–4.7)
Alkaline Phosphatase: 142 IU/L — ABNORMAL HIGH (ref 44–121)
BUN/Creatinine Ratio: 17 (ref 12–28)
BUN: 7 mg/dL — ABNORMAL LOW (ref 8–27)
Bilirubin Total: 0.6 mg/dL (ref 0.0–1.2)
CO2: 24 mmol/L (ref 20–29)
Calcium: 9 mg/dL (ref 8.7–10.3)
Chloride: 106 mmol/L (ref 96–106)
Creatinine, Ser: 0.42 mg/dL — ABNORMAL LOW (ref 0.57–1.00)
Globulin, Total: 2.7 g/dL (ref 1.5–4.5)
Glucose: 115 mg/dL — ABNORMAL HIGH (ref 65–99)
Potassium: 3.5 mmol/L (ref 3.5–5.2)
Sodium: 142 mmol/L (ref 134–144)
Total Protein: 6.3 g/dL (ref 6.0–8.5)
eGFR: 101 mL/min/{1.73_m2} (ref >59)

## 2020-08-16 LAB — TSH: TSH: 0.028 u[IU]/mL — ABNORMAL LOW (ref 0.450–4.500)

## 2020-08-19 ENCOUNTER — Ambulatory Visit (INDEPENDENT_AMBULATORY_CARE_PROVIDER_SITE_OTHER): Payer: Medicare Other

## 2020-08-19 VITALS — Ht 63.0 in | Wt 145.0 lb

## 2020-08-19 DIAGNOSIS — Z0000 Encounter for general adult medical examination without abnormal findings: Secondary | ICD-10-CM

## 2020-08-19 NOTE — Patient Instructions (Signed)
Maria Davies , Thank you for taking time to come for your Medicare Wellness Visit. I appreciate your ongoing commitment to your health goals. Please review the following plan we discussed and let me know if I can assist you in the future.   Screening recommendations/referrals: Colonoscopy: completed 10/20/2019 Mammogram: completed 02/03/2020 Bone Density: completed 10/22/2013 Recommended yearly ophthalmology/optometry visit for glaucoma screening and checkup Recommended yearly dental visit for hygiene and checkup  Vaccinations: Influenza vaccine: completed 04/18/2020, due 01/16/2021 Pneumococcal vaccine: completed 07/03/2016 Tdap vaccine: due Shingles vaccine: completed   Covid-19: 05/25/2020, 07/16/2019, 06/25/2019  Advanced directives: Please bring a copy of your POA (Power of Attorney) and/or Living Will to your next appointment.   Conditions/risks identified: none  Next appointment: Follow up in one year for your annual wellness visit    Preventive Care 65 Years and Older, Female Preventive care refers to lifestyle choices and visits with your health care provider that can promote health and wellness. What does preventive care include?  A yearly physical exam. This is also called an annual well check.  Dental exams once or twice a year.  Routine eye exams. Ask your health care provider how often you should have your eyes checked.  Personal lifestyle choices, including:  Daily care of your teeth and gums.  Regular physical activity.  Eating a healthy diet.  Avoiding tobacco and drug use.  Limiting alcohol use.  Practicing safe sex.  Taking low-dose aspirin every day.  Taking vitamin and mineral supplements as recommended by your health care provider. What happens during an annual well check? The services and screenings done by your health care provider during your annual well check will depend on your age, overall health, lifestyle risk factors, and family history of  disease. Counseling  Your health care provider may ask you questions about your:  Alcohol use.  Tobacco use.  Drug use.  Emotional well-being.  Home and relationship well-being.  Sexual activity.  Eating habits.  History of falls.  Memory and ability to understand (cognition).  Work and work Statistician.  Reproductive health. Screening  You may have the following tests or measurements:  Height, weight, and BMI.  Blood pressure.  Lipid and cholesterol levels. These may be checked every 5 years, or more frequently if you are over 82 years old.  Skin check.  Lung cancer screening. You may have this screening every year starting at age 64 if you have a 30-pack-year history of smoking and currently smoke or have quit within the past 15 years.  Fecal occult blood test (FOBT) of the stool. You may have this test every year starting at age 6.  Flexible sigmoidoscopy or colonoscopy. You may have a sigmoidoscopy every 5 years or a colonoscopy every 10 years starting at age 85.  Hepatitis C blood test.  Hepatitis B blood test.  Sexually transmitted disease (STD) testing.  Diabetes screening. This is done by checking your blood sugar (glucose) after you have not eaten for a while (fasting). You may have this done every 1-3 years.  Bone density scan. This is done to screen for osteoporosis. You may have this done starting at age 55.  Mammogram. This may be done every 1-2 years. Talk to your health care provider about how often you should have regular mammograms. Talk with your health care provider about your test results, treatment options, and if necessary, the need for more tests. Vaccines  Your health care provider may recommend certain vaccines, such as:  Influenza vaccine. This is  recommended every year.  Tetanus, diphtheria, and acellular pertussis (Tdap, Td) vaccine. You may need a Td booster every 10 years.  Zoster vaccine. You may need this after age  76.  Pneumococcal 13-valent conjugate (PCV13) vaccine. One dose is recommended after age 2.  Pneumococcal polysaccharide (PPSV23) vaccine. One dose is recommended after age 69. Talk to your health care provider about which screenings and vaccines you need and how often you need them. This information is not intended to replace advice given to you by your health care provider. Make sure you discuss any questions you have with your health care provider. Document Released: 07/01/2015 Document Revised: 02/22/2016 Document Reviewed: 04/05/2015 Elsevier Interactive Patient Education  2017 [place] Prevention in the Home Falls can cause injuries. They can happen to people of all ages. There are many things you can do to make your home safe and to help prevent falls. What can I do on the outside of my home?  Regularly fix the edges of walkways and driveways and fix any cracks.  Remove anything that might make you trip as you walk through a door, such as a raised step or threshold.  Trim any bushes or trees on the path to your home.  Use bright outdoor lighting.  Clear any walking paths of anything that might make someone trip, such as rocks or tools.  Regularly check to see if handrails are loose or broken. Make sure that both sides of any steps have handrails.  Any raised decks and porches should have guardrails on the edges.  Have any leaves, snow, or ice cleared regularly.  Use sand or salt on walking paths during winter.  Clean up any spills in your garage right away. This includes oil or grease spills. What can I do in the bathroom?  Use night lights.  Install grab bars by the toilet and in the tub and shower. Do not use towel bars as grab bars.  Use non-skid mats or decals in the tub or shower.  If you need to sit down in the shower, use a plastic, non-slip stool.  Keep the floor dry. Clean up any water that spills on the floor as soon as it happens.  Remove  soap buildup in the tub or shower regularly.  Attach bath mats securely with double-sided non-slip rug tape.  Do not have throw rugs and other things on the floor that can make you trip. What can I do in the bedroom?  Use night lights.  Make sure that you have a light by your bed that is easy to reach.  Do not use any sheets or blankets that are too big for your bed. They should not hang down onto the floor.  Have a firm chair that has side arms. You can use this for support while you get dressed.  Do not have throw rugs and other things on the floor that can make you trip. What can I do in the kitchen?  Clean up any spills right away.  Avoid walking on wet floors.  Keep items that you use a lot in easy-to-reach places.  If you need to reach something above you, use a strong step stool that has a grab bar.  Keep electrical cords out of the way.  Do not use floor polish or wax that makes floors slippery. If you must use wax, use non-skid floor wax.  Do not have throw rugs and other things on the floor that can make you trip.  What can I do with my stairs?  Do not leave any items on the stairs.  Make sure that there are handrails on both sides of the stairs and use them. Fix handrails that are broken or loose. Make sure that handrails are as long as the stairways.  Check any carpeting to make sure that it is firmly attached to the stairs. Fix any carpet that is loose or worn.  Avoid having throw rugs at the top or bottom of the stairs. If you do have throw rugs, attach them to the floor with carpet tape.  Make sure that you have a light switch at the top of the stairs and the bottom of the stairs. If you do not have them, ask someone to add them for you. What else can I do to help prevent falls?  Wear shoes that:  Do not have high heels.  Have rubber bottoms.  Are comfortable and fit you well.  Are closed at the toe. Do not wear sandals.  If you use a  stepladder:  Make sure that it is fully opened. Do not climb a closed stepladder.  Make sure that both sides of the stepladder are locked into place.  Ask someone to hold it for you, if possible.  Clearly mark and make sure that you can see:  Any grab bars or handrails.  First and last steps.  Where the edge of each step is.  Use tools that help you move around (mobility aids) if they are needed. These include:  Canes.  Walkers.  Scooters.  Crutches.  Turn on the lights when you go into a dark area. Replace any light bulbs as soon as they burn out.  Set up your furniture so you have a clear path. Avoid moving your furniture around.  If any of your floors are uneven, fix them.  If there are any pets around you, be aware of where they are.  Review your medicines with your doctor. Some medicines can make you feel dizzy. This can increase your chance of falling. Ask your doctor what other things that you can do to help prevent falls. This information is not intended to replace advice given to you by your health care provider. Make sure you discuss any questions you have with your health care provider. Document Released: 03/31/2009 Document Revised: 11/10/2015 Document Reviewed: 07/09/2014 Elsevier Interactive Patient Education  2017 [employer].

## 2020-08-19 NOTE — Progress Notes (Signed)
I connected with Maria Davies today by telephone and verified that I am speaking with the correct person using two identifiers. Location patient: home Location provider: work Persons participating in the virtual visit: Annabell Oconnor, Glenna Durand LPN.   I discussed the limitations, risks, security and privacy concerns of performing an evaluation and management service by telephone and the availability of in person appointments. I also discussed with the patient that there may be a patient responsible charge related to this service. The patient expressed understanding and verbally consented to this telephonic visit.    Interactive audio and video telecommunications were attempted between this provider and patient, however failed, due to patient having technical difficulties OR patient did not have access to video capability.  We continued and completed visit with audio only.     Vital signs may be patient reported or missing.  Subjective:   Maria Davies is a 80 y.o. female who presents for Medicare Annual (Subsequent) preventive examination.  Review of Systems     Cardiac Risk Factors include: advanced age (>27men, >60 women);hypertension;sedentary lifestyle     Objective:    Today's Vitals   08/19/20 0812  Weight: 145 lb (65.8 kg)  Height: 5\' 3"  (1.6 m)   Body mass index is 25.69 kg/m.  Advanced Directives 08/19/2020 06/21/2020 05/31/2020 05/09/2020 03/28/2020 03/11/2020 03/04/2020  Does Patient Have a Medical Advance Directive? Yes Yes Yes Yes Yes Yes Yes  Type of Paramedic of [place];Living will [place];Living will [place];Living will [place];Living will [place];Living will [place];Living will [place];Living will  Does patient want to make changes to medical advance directive? - No - Patient declined - Yes  (MAU/Ambulatory/Procedural Areas - Information given) Yes (ED - Information included in AVS) No - Patient declined No - Patient declined  Copy of [place] in Chart? No - copy requested No - copy requested - - - No - copy requested No - copy requested  Would patient like information on creating a medical advance directive? - - - - - - -    Current Medications (verified) Outpatient Encounter Medications as of 08/19/2020  Medication Sig  . acetaminophen (TYLENOL) 325 MG tablet Take 325 mg by mouth 3 (three) times daily as needed for moderate pain or headache.  Marland Kitchen amLODipine (NORVASC) 2.5 MG tablet Take 1 tablet (2.5 mg total) by mouth daily.  Marland Kitchen aspirin EC 81 MG tablet Take 81 mg by mouth daily.  . benazepril (LOTENSIN) 40 MG tablet Take 1 tablet (40 mg total) by mouth daily.  . methimazole (TAPAZOLE) 5 MG tablet Take 5 mg by mouth daily.   . metoprolol succinate (TOPROL-XL) 50 MG 24 hr tablet Take 1 tablet (50 mg total) by mouth daily. Take with or immediately following a meal.  . Multiple Vitamin (MULTI-VITAMINS) TABS Take 1 tablet by mouth daily.   Marland Kitchen Phenylephrine HCl (NEO-SYNEPHRINE NA) Place 1 spray into the nose daily as needed (congestion).  . Probiotic Product (PROBIOTIC ADVANCED PO) Take 1-2 capsules by mouth daily. When taking antibiotics pt will take 2 capsules instead of 1, normally just takes 1 per day otherwise  . silver sulfADIAZINE (SILVADENE) 1 % cream Apply 1 application topically daily.  . Cholecalciferol (VITAMIN D3) 50 MCG (2000 UT) capsule Take 4,000 Units by mouth daily. (Patient not taking: No sig reported)  . lidocaine-prilocaine (EMLA) cream Apply to affected area once (Patient not taking: No  sig reported)  . ondansetron (ZOFRAN) 8 MG tablet Take 1 tablet (8 mg total) by mouth 2 (two) times daily as needed for refractory nausea / vomiting. (Patient not taking: No sig reported)  . potassium chloride SA (KLOR-CON) 20 MEQ tablet Take 1 tablet (20 mEq total) by  mouth 2 (two) times daily. (Patient not taking: Reported on 08/19/2020)  . prochlorperazine (COMPAZINE) 10 MG tablet Take 1 tablet (10 mg total) by mouth every 6 (six) hours as needed (Nausea or vomiting). (Patient not taking: Reported on 08/19/2020)  . Sodium Hyaluronate, oral, (HYALURONIC ACID PO) Take 1 tablet by mouth daily. (Patient not taking: No sig reported)   Facility-Administered Encounter Medications as of 08/19/2020  Medication  . sodium chloride flush (NS) 0.9 % injection 10 mL  . sodium chloride flush (NS) 0.9 % injection 10 mL    Allergies (verified) Patient has no known allergies.   History: Past Medical History:  Diagnosis Date  . Anemia   . Arthritis   . Basal cell carcinoma    back  . Breast cancer ([place]) 2010   RT LUMPECTOMY  . Cancer ([place])    rt breast  . Chemotherapy induced nausea and vomiting   . Colon cancer ([place]) 2021   colon cancer  . Heart murmur   . Hypertension   . Hypothyroidism    currently not on meds  . Osteopenia   . Personal history of chemotherapy 2010   right breast ca  . Personal history of radiation therapy 2010   right breast ca  . Port-A-Cath in place    Past Surgical History:  Procedure Laterality Date  . aortic valve stenosis  08/11/2020  . BACK SURGERY    . BREAST EXCISIONAL BIOPSY Right 03/2009   radiation and chemo +  . BREAST EXCISIONAL BIOPSY Bilateral    NEG  . BREAST SURGERY    . CATARACT EXTRACTION W/ INTRAOCULAR LENS  IMPLANT, BILATERAL    . CHOLECYSTECTOMY    . COLONOSCOPY WITH PROPOFOL N/A 10/19/2019   Procedure: COLONOSCOPY WITH PROPOFOL-attempted, unable to perform poor prep;  Surgeon: Lucilla Lame, MD;  Location: [place];  Service: Endoscopy;  Laterality: N/A;  priority 3  . COLONOSCOPY WITH PROPOFOL N/A 10/20/2019   Procedure: COLONOSCOPY WITH PROPOFOL;  Surgeon: Lucilla Lame, MD;  [facility];  Service: Endoscopy;  Laterality: N/A;  . ESOPHAGEAL DILATION  10/19/2019   Procedure: ESOPHAGEAL  DILATION;  Surgeon: Lucilla Lame, MD;  Location: [place];  Service: Endoscopy;;  . ESOPHAGOGASTRODUODENOSCOPY (EGD) WITH PROPOFOL N/A 10/19/2019   Procedure: ESOPHAGOGASTRODUODENOSCOPY (EGD) WITH PROPOFOL;  Surgeon: Lucilla Lame, MD;  Location: [place];  Service: Endoscopy;  Laterality: N/A;  . PORTACATH PLACEMENT Right 11/23/2019   Procedure: INSERTION PORT-A-CATH;  Surgeon: Ronny Bacon, MD;  Location: [facility] ORS;  Service: General;  Laterality: Right;   Family History  Problem Relation Age of Onset  . Cancer Mother   . Heart disease Father   . Breast cancer Paternal Aunt 28   Social History   Socioeconomic History  . Marital status: Widowed    Spouse name: Not on file  . Number of children: Not on file  . Years of education: Not on file  . Highest education level: Not on file  Occupational History  . Occupation: retired   Tobacco Use  . Smoking status: Never Smoker  . Smokeless tobacco: Never Used  Vaping Use  . Vaping Use: Never used  Substance and Sexual Activity  . Alcohol use:  Yes    Alcohol/week: 2.0 standard drinks    Types: 2 Standard drinks or equivalent per week    Comment: only on weeks not having treatment  . Drug use: No  . Sexual activity: Not on file  Other Topics Concern  . Not on file  Social History Narrative  . Not on file   Social Determinants of Health   Financial Resource Strain: Low Risk   . Difficulty of Paying Living Expenses: Not hard at all  Food Insecurity: No Food Insecurity  . Worried About Charity fundraiser in the Last Year: Never true  . Ran Out of Food in the Last Year: Never true  Transportation Needs: No Transportation Needs  . Lack of Transportation (Medical): No  . Lack of Transportation (Non-Medical): No  Physical Activity: Inactive  . Days of Exercise per Week: 0 days  . Minutes of Exercise per Session: 0 min  Stress: No Stress Concern Present  . Feeling of Stress : Not at all  Social Connections:  Not on file    Tobacco Counseling Counseling given: Not Answered   Clinical Intake:  Pre-visit preparation completed: Yes  Pain : No/denies pain     Nutritional Status: BMI 25 -29 Overweight Nutritional Risks: None Diabetes: No  How often do you need to have someone help you when you read instructions, pamphlets, or other written materials from your doctor or pharmacy?: 1 - Never What is the last grade level you completed in school?: 3yr college  Diabetic? no  Interpreter Needed?: No  Information entered by :: NAllen LPN   Activities of Daily Living In your present state of health, do you have any difficulty performing the following activities: 08/19/2020 11/23/2019  Hearing? N N  Vision? N N  Difficulty concentrating or making decisions? N N  Walking or climbing stairs? Y N  Comment a little -  Dressing or bathing? N N  Doing errands, shopping? N -  Preparing Food and eating ? N -  Using the Toilet? N -  In the past six months, have you accidently leaked urine? Y -  Do you have problems with loss of bowel control? N -  Managing your Medications? N -  Managing your Finances? N -  Housekeeping or managing your Housekeeping? N -  Some recent data might be hidden    Patient Care Team: Valerie Roys, DO as PCP - General (Family Medicine) Guadalupe Maple, MD as PCP - Family Medicine (Family Medicine) Ronny Bacon, MD as Consulting Physician (General Surgery) Lloyd Huger, MD as Consulting Physician (Oncology)  Indicate any recent Medical Services you may have received from other than [facility] providers in the past year (date may be approximate).     Assessment:   This is a routine wellness examination for Zaliah.  Hearing/Vision screen  Hearing Screening   125Hz  250Hz  500Hz  1000Hz  2000Hz  3000Hz  4000Hz  6000Hz  8000Hz   Right ear:           Left ear:           Vision Screening Comments: Regular eye exams, Dr. Atilano Median  Dietary issues and exercise activities  discussed: Current Exercise Habits: The patient does not participate in regular exercise at present  Goals    . Patient Stated     08/19/2020, keep weight around 140 pounds      Depression Screen PHQ 2/9 Scores 08/19/2020 08/15/2020 08/05/2019 07/28/2018 01/23/2018 07/03/2016 05/02/2015  PHQ - 2 Score 0 0 0 0 0 0 0  PHQ- 9 Score - - - 2 0 - -    Fall Risk Fall Risk  08/19/2020 08/08/2020 08/02/2020 10/22/2019 08/05/2019  Falls in the past year? 1 0 1 0 0  Comment tripped over cat - - - -  Number falls in past yr: 0 - - - 0  Injury with Fall? 0 - - - 0  Risk for fall due to : Medication side effect - - - -  Follow up Falls evaluation completed;Education provided;Falls prevention discussed - - - -    FALL RISK PREVENTION PERTAINING TO THE HOME:  Any stairs in or around the home? No  If so, are there any without handrails? n/a Home free of loose throw rugs in walkways, pet beds, electrical cords, etc? Yes  Adequate lighting in your home to reduce risk of falls? Yes   ASSISTIVE DEVICES UTILIZED TO PREVENT FALLS:  Life alert? No  Use of a cane, walker or w/c? No  Grab bars in the bathroom? No  Shower chair or bench in shower? Yes  Elevated toilet seat or a handicapped toilet? Yes   TIMED UP AND GO:  Was the test performed? No .     Cognitive Function:     6CIT Screen 08/19/2020  What Year? 0 points  What month? 0 points  What time? 0 points  Count back from 20 0 points  Months in reverse 0 points  Repeat phrase 4 points  Total Score 4    Immunizations Immunization History  Administered Date(s) Administered  . Fluad Quad(high Dose 65+) 02/18/2019, 04/18/2020  . Influenza, High Dose Seasonal PF 04/23/2016, 03/25/2017  . Influenza,inj,Quad PF,6+ Mos 04/11/2015  . Influenza-Unspecified 04/01/2018  . PFIZER(Purple Top)SARS-COV-2 Vaccination 06/25/2019, 07/16/2019, 05/25/2020  . Pneumococcal Conjugate-13 04/26/2014  . Pneumococcal Polysaccharide-23 07/03/2016  . Tdap 02/10/2009   . Zoster 10/24/2006  . Zoster Recombinat (Shingrix) 02/18/2018, 05/09/2018    TDAP status: Due, Education has been provided regarding the importance of this vaccine. Advised may receive this vaccine at local pharmacy or Health Dept. Aware to provide a copy of the vaccination record if obtained from local pharmacy or Health Dept. Verbalized acceptance and understanding.  Flu Vaccine status: Up to date  Pneumococcal vaccine status: Up to date  Covid-19 vaccine status: Completed vaccines  Qualifies for Shingles Vaccine? Yes   Zostavax completed Yes   Shingrix Completed?: Yes  Screening Tests Health Maintenance  Topic Date Due  . Hepatitis C Screening  Never done  . TETANUS/TDAP  02/11/2019  . COVID-19 Vaccine (4 - Booster for Pfizer series) 11/23/2020  . INFLUENZA VACCINE  Completed  . DEXA SCAN  Completed  . PNA vac Low Risk Adult  Completed  . HPV VACCINES  Aged Out    Health Maintenance  Health Maintenance Due  Topic Date Due  . Hepatitis C Screening  Never done  . TETANUS/TDAP  02/11/2019    Colorectal cancer screening: Type of screening: Colonoscopy. Completed 10/17/2019. Repeat every 5 years  Mammogram status: Completed 02/03/2020. Repeat every year  Bone Density status: Completed 10/22/2013.   Lung Cancer Screening: (Low Dose CT Chest recommended if Age 52-80 years, 30 pack-year currently smoking OR have quit w/in 15years.) does not qualify.   Lung Cancer Screening Referral: no  Additional Screening:  Hepatitis C Screening: does qualify; due  Vision Screening: Recommended annual ophthalmology exams for early detection of glaucoma and other disorders of the eye. Is the patient up to date with their annual eye exam?  Yes  Who  is the provider or what is the name of the office in which the patient attends annual eye exams? Dr. Atilano Median If pt is not established with a provider, would they like to be referred to a provider to establish care? No .   Dental Screening:  Recommended annual dental exams for proper oral hygiene  Community Resource Referral / Chronic Care Management: CRR required this visit?  No   CCM required this visit?  No      Plan:     I have personally reviewed and noted the following in the patient's chart:   . Medical and social history . Use of alcohol, tobacco or illicit drugs  . Current medications and supplements . Functional ability and status . Nutritional status . Physical activity . Advanced directives . List of other physicians . Hospitalizations, surgeries, and ER visits in previous 12 months . Vitals . Screenings to include cognitive, depression, and falls . Referrals and appointments  In addition, I have reviewed and discussed with patient certain preventive protocols, quality metrics, and best practice recommendations. A written personalized care plan for preventive services as well as general preventive health recommendations were provided to patient.     Kellie Simmering, LPN   01/19/697   Nurse Notes:

## 2020-08-30 DIAGNOSIS — D649 Anemia, unspecified: Secondary | ICD-10-CM | POA: Diagnosis not present

## 2020-08-30 DIAGNOSIS — M810 Age-related osteoporosis without current pathological fracture: Secondary | ICD-10-CM | POA: Diagnosis present

## 2020-08-30 DIAGNOSIS — Z85038 Personal history of other malignant neoplasm of large intestine: Secondary | ICD-10-CM | POA: Diagnosis not present

## 2020-08-30 DIAGNOSIS — I442 Atrioventricular block, complete: Secondary | ICD-10-CM | POA: Diagnosis not present

## 2020-08-30 DIAGNOSIS — Z006 Encounter for examination for normal comparison and control in clinical research program: Secondary | ICD-10-CM | POA: Diagnosis not present

## 2020-08-30 DIAGNOSIS — I110 Hypertensive heart disease with heart failure: Secondary | ICD-10-CM | POA: Diagnosis not present

## 2020-08-30 DIAGNOSIS — G8918 Other acute postprocedural pain: Secondary | ICD-10-CM | POA: Diagnosis not present

## 2020-08-30 DIAGNOSIS — R739 Hyperglycemia, unspecified: Secondary | ICD-10-CM | POA: Diagnosis not present

## 2020-08-30 DIAGNOSIS — I509 Heart failure, unspecified: Secondary | ICD-10-CM | POA: Diagnosis not present

## 2020-08-30 DIAGNOSIS — J9811 Atelectasis: Secondary | ICD-10-CM | POA: Diagnosis not present

## 2020-08-30 DIAGNOSIS — Z800 Family history of malignant neoplasm of digestive organs: Secondary | ICD-10-CM | POA: Diagnosis not present

## 2020-08-30 DIAGNOSIS — T380X5A Adverse effect of glucocorticoids and synthetic analogues, initial encounter: Secondary | ICD-10-CM | POA: Diagnosis not present

## 2020-08-30 DIAGNOSIS — Z952 Presence of prosthetic heart valve: Secondary | ICD-10-CM | POA: Diagnosis not present

## 2020-08-30 DIAGNOSIS — E0500 Thyrotoxicosis with diffuse goiter without thyrotoxic crisis or storm: Secondary | ICD-10-CM | POA: Diagnosis present

## 2020-08-30 DIAGNOSIS — I350 Nonrheumatic aortic (valve) stenosis: Secondary | ICD-10-CM | POA: Diagnosis present

## 2020-08-30 DIAGNOSIS — Z20822 Contact with and (suspected) exposure to covid-19: Secondary | ICD-10-CM | POA: Diagnosis present

## 2020-08-30 DIAGNOSIS — Z7982 Long term (current) use of aspirin: Secondary | ICD-10-CM | POA: Diagnosis not present

## 2020-08-30 DIAGNOSIS — I2510 Atherosclerotic heart disease of native coronary artery without angina pectoris: Secondary | ICD-10-CM | POA: Diagnosis not present

## 2020-08-30 DIAGNOSIS — Z9221 Personal history of antineoplastic chemotherapy: Secondary | ICD-10-CM | POA: Diagnosis not present

## 2020-08-30 DIAGNOSIS — I10 Essential (primary) hypertension: Secondary | ICD-10-CM | POA: Diagnosis present

## 2020-08-30 DIAGNOSIS — I517 Cardiomegaly: Secondary | ICD-10-CM | POA: Diagnosis not present

## 2020-08-30 DIAGNOSIS — J90 Pleural effusion, not elsewhere classified: Secondary | ICD-10-CM | POA: Diagnosis not present

## 2020-08-30 DIAGNOSIS — Z853 Personal history of malignant neoplasm of breast: Secondary | ICD-10-CM | POA: Diagnosis not present

## 2020-08-30 DIAGNOSIS — Z4682 Encounter for fitting and adjustment of non-vascular catheter: Secondary | ICD-10-CM | POA: Diagnosis not present

## 2020-08-30 DIAGNOSIS — R5381 Other malaise: Secondary | ICD-10-CM | POA: Diagnosis present

## 2020-08-30 DIAGNOSIS — Z9842 Cataract extraction status, left eye: Secondary | ICD-10-CM | POA: Diagnosis not present

## 2020-08-30 DIAGNOSIS — I9581 Postprocedural hypotension: Secondary | ICD-10-CM | POA: Diagnosis not present

## 2020-08-30 DIAGNOSIS — Z859 Personal history of malignant neoplasm, unspecified: Secondary | ICD-10-CM | POA: Diagnosis not present

## 2020-08-30 DIAGNOSIS — Z923 Personal history of irradiation: Secondary | ICD-10-CM | POA: Diagnosis not present

## 2020-08-31 DIAGNOSIS — Z952 Presence of prosthetic heart valve: Secondary | ICD-10-CM | POA: Insufficient documentation

## 2020-08-31 DIAGNOSIS — I442 Atrioventricular block, complete: Secondary | ICD-10-CM | POA: Insufficient documentation

## 2020-08-31 DIAGNOSIS — G8918 Other acute postprocedural pain: Secondary | ICD-10-CM | POA: Insufficient documentation

## 2020-08-31 HISTORY — PX: TRANSCATHETER AORTIC VALVE REPLACEMENT, TRANSFEMORAL: SHX6400

## 2020-08-31 HISTORY — DX: Atrioventricular block, complete: I44.2

## 2020-08-31 HISTORY — DX: Presence of prosthetic heart valve: Z95.2

## 2020-09-01 DIAGNOSIS — D649 Anemia, unspecified: Secondary | ICD-10-CM | POA: Insufficient documentation

## 2020-09-03 DIAGNOSIS — Z950 Presence of cardiac pacemaker: Secondary | ICD-10-CM

## 2020-09-03 HISTORY — DX: Presence of cardiac pacemaker: Z95.0

## 2020-09-03 HISTORY — PX: PACEMAKER INSERTION: SHX728

## 2020-09-07 DIAGNOSIS — I700 Atherosclerosis of aorta: Secondary | ICD-10-CM | POA: Insufficient documentation

## 2020-09-08 DIAGNOSIS — I442 Atrioventricular block, complete: Secondary | ICD-10-CM | POA: Diagnosis not present

## 2020-09-08 DIAGNOSIS — I700 Atherosclerosis of aorta: Secondary | ICD-10-CM | POA: Diagnosis not present

## 2020-09-08 DIAGNOSIS — Z952 Presence of prosthetic heart valve: Secondary | ICD-10-CM | POA: Diagnosis not present

## 2020-09-08 DIAGNOSIS — I350 Nonrheumatic aortic (valve) stenosis: Secondary | ICD-10-CM | POA: Diagnosis not present

## 2020-09-08 DIAGNOSIS — I10 Essential (primary) hypertension: Secondary | ICD-10-CM | POA: Diagnosis not present

## 2020-09-15 NOTE — Progress Notes (Signed)
Maria Davies  [phone] Fax:([phone]) [phone]  ID: [name] OB: April 14, 1941  MR#: [id number]  LYY#:[id number]  Patient Care Team: Valerie Roys, DO as PCP - General (Family Medicine) Guadalupe Maple, MD as PCP - Family Medicine (Family Medicine) Ronny Bacon, MD as Consulting Physician (General Surgery) Lloyd Huger, MD as Consulting Physician (Oncology)   CHIEF COMPLAINT: Stage IIIb right colon cancer.  INTERVAL HISTORY: Patient returns to clinic today for routine 36-monthevaluation.  She currently feels well and is asymptomatic.  She denies any weakness or fatigue.  She has good appetite.  She has no neurologic complaints.  She denies any recent fevers or illnesses.  She has no chest pain, shortness of breath, cough, or hemoptysis. She denies any vomiting, constipation, or diarrhea.  She has no melena or hematochezia.  She has no urinary complaints.  Patient offers no specific complaints today.  REVIEW OF SYSTEMS:   Review of Systems  Constitutional: Negative.  Negative for fever, malaise/fatigue and weight loss.  Respiratory: Negative.  Negative for cough, hemoptysis and shortness of breath.   Cardiovascular: Negative.  Negative for chest pain and leg swelling.  Gastrointestinal: Negative.  Negative for abdominal pain, blood in stool, melena and nausea.  Genitourinary: Negative.  Negative for hematuria.  Musculoskeletal: Negative.  Negative for back pain.  Skin: Negative.  Negative for rash.  Neurological: Negative.  Negative for dizziness, focal weakness, weakness and headaches.  Psychiatric/Behavioral: Negative.  The patient is not nervous/anxious.    As per HPI. Otherwise, a complete review of systems is negative.   PAST MEDICAL HISTORY: Past Medical History:  Diagnosis Date  . Anemia   . Arthritis   . Basal cell carcinoma    back  . [place]   RT LUMPECTOMY  . Cancer ([place]    [place] breast  . Chemotherapy  induced nausea and vomiting   . [place]   colon cancer  . Heart murmur   . Hypertension   . Hypothyroidism    currently not on meds  . Osteopenia   . Personal history of chemotherapy 2010   right breast ca  . Personal history of radiation therapy 2010   right breast ca  . Port-A-Cath in place     PAST SURGICAL HISTORY: Past Surgical History:  Procedure Laterality Date  . aortic valve stenosis  08/11/2020  . BACK SURGERY    . BREAST EXCISIONAL BIOPSY Right 03/2009   radiation and chemo +  . BREAST EXCISIONAL BIOPSY Bilateral    NEG  . BREAST SURGERY    . CATARACT EXTRACTION W/ INTRAOCULAR LENS  IMPLANT, BILATERAL    . CHOLECYSTECTOMY    . COLONOSCOPY WITH PROPOFOL N/A 10/19/2019   Procedure: COLONOSCOPY WITH PROPOFOL-attempted, unable to perform poor prep;  Surgeon: WLucilla Lame MD;  [place]  Service: Endoscopy;  Laterality: N/A;  priority 3  . COLONOSCOPY WITH PROPOFOL N/A 10/20/2019   Procedure: COLONOSCOPY WITH PROPOFOL;  Surgeon: WLucilla Lame MD;  [facility]  Service: Endoscopy;  Laterality: N/A;  . ESOPHAGEAL DILATION  10/19/2019   Procedure: ESOPHAGEAL DILATION;  Surgeon: WLucilla Lame MD;  [place]  Service: Endoscopy;;  . ESOPHAGOGASTRODUODENOSCOPY (EGD) WITH PROPOFOL N/A 10/19/2019   Procedure: ESOPHAGOGASTRODUODENOSCOPY (EGD) WITH PROPOFOL;  Surgeon: WLucilla Lame MD;  [place]  Service: Endoscopy;  Laterality: N/A;  . PORTACATH PLACEMENT Right 11/23/2019   Procedure: INSERTION PORT-A-CATH;  Surgeon: RRonny Bacon MD;  Location:  [facility];  Service: General;  Laterality: Right;    FAMILY HISTORY: Family History  Problem Relation Age of Onset  . Cancer Mother   . Heart disease Father   . Breast cancer Paternal Aunt 10    ADVANCED DIRECTIVES (Y/N):  N  HEALTH MAINTENANCE: Social History   Tobacco Use  . Smoking status: Never Smoker  . Smokeless tobacco: Never Used   Vaping Use  . Vaping Use: Never used  Substance Use Topics  . Alcohol use: Yes    Alcohol/week: 2.0 standard drinks    Types: 2 Standard drinks or equivalent per week    Comment: only on weeks not having treatment  . Drug use: No     Colonoscopy:  PAP:  Bone density:  Lipid panel:  No Known Allergies  Current Outpatient Medications  Medication Sig Dispense Refill  . acetaminophen (TYLENOL) 325 MG tablet Take 325 mg by mouth 3 (three) times daily as needed for moderate pain or headache.    Marland Kitchen amLODipine (NORVASC) 2.5 MG tablet Take 1 tablet (2.5 mg total) by mouth daily. 90 tablet 1  . aspirin EC 81 MG tablet Take 81 mg by mouth daily.    . Cholecalciferol (VITAMIN D3) 50 MCG (2000 UT) capsule Take 4,000 Units by mouth daily.    Marland Kitchen lidocaine-prilocaine (EMLA) cream Apply to affected area once 30 g 3  . methimazole (TAPAZOLE) 5 MG tablet Take 5 mg by mouth daily.     . metoprolol succinate (TOPROL-XL) 50 MG 24 hr tablet Take 1 tablet (50 mg total) by mouth daily. Take with or immediately following a meal. 90 tablet 1  . Multiple Vitamin (MULTI-VITAMINS) TABS Take 1 tablet by mouth daily.     . ondansetron (ZOFRAN) 8 MG tablet Take 1 tablet (8 mg total) by mouth 2 (two) times daily as needed for refractory nausea / vomiting. 60 tablet 2  . Phenylephrine HCl (NEO-SYNEPHRINE NA) Place 1 spray into the nose daily as needed (congestion).    . potassium chloride SA (KLOR-CON) 20 MEQ tablet Take 1 tablet (20 mEq total) by mouth 2 (two) times daily. 60 tablet 2  . Probiotic Product (PROBIOTIC ADVANCED PO) Take 1-2 capsules by mouth daily. When taking antibiotics pt will take 2 capsules instead of 1, normally just takes 1 per day otherwise    . prochlorperazine (COMPAZINE) 10 MG tablet Take 1 tablet (10 mg total) by mouth every 6 (six) hours as needed (Nausea or vomiting). 60 tablet 2  . silver sulfADIAZINE (SILVADENE) 1 % cream Apply 1 application topically daily. 50 g 0  . Sodium Hyaluronate,  oral, (HYALURONIC ACID PO) Take 1 tablet by mouth daily.     No current facility-administered medications for this visit.   Facility-Administered Medications Ordered in Other Visits  Medication Dose Route Frequency Provider Last Rate Last Admin  . sodium chloride flush (NS) 0.9 % injection 10 mL  10 mL Intravenous PRN Lloyd Huger, MD   10 mL at 03/07/20 0836  . sodium chloride flush (NS) 0.9 % injection 10 mL  10 mL Intravenous PRN Lloyd Huger, MD   10 mL at 06/21/20 1259    OBJECTIVE: Vitals:   09/20/20 1317  BP: 137/79  Pulse: 86  Resp: 16  Temp: 98.6 F (37 C)     Body mass index is 26.93 kg/m.    ECOG FS:0 - Asymptomatic  General: Well-developed, well-nourished, no acute distress. Eyes: Pink conjunctiva, anicteric sclera. HEENT: Normocephalic, moist mucous membranes. Lungs:  No audible wheezing or coughing. Heart: Regular rate and rhythm. Abdomen: Soft, nontender, no obvious distention. Musculoskeletal: No edema, cyanosis, or clubbing. Neuro: Alert, answering all questions appropriately. Cranial nerves grossly intact. Skin: No rashes or petechiae noted. Psych: Normal affect.   LAB RESULTS:  Lab Results  Component Value Date   NA 142 09/20/2020   K 4.0 09/20/2020   CL 104 09/20/2020   CO2 28 09/20/2020   GLUCOSE 103 (H) 09/20/2020   BUN 13 09/20/2020   CREATININE 0.81 09/20/2020   CALCIUM 9.3 09/20/2020   PROT 6.8 09/20/2020   ALBUMIN 3.7 09/20/2020   AST 26 09/20/2020   ALT 14 09/20/2020   ALKPHOS 121 09/20/2020   BILITOT 0.9 09/20/2020   GFRNONAA >60 09/20/2020   GFRAA >60 03/14/2020    Lab Results  Component Value Date   WBC 5.0 09/20/2020   NEUTROABS 3.0 09/20/2020   HGB 11.1 (L) 09/20/2020   HCT 33.8 (L) 09/20/2020   MCV 96.0 09/20/2020   PLT 134 (L) 09/20/2020     STUDIES: No results found.  ASSESSMENT: Stage IIIb right colon cancer  PLAN:    1. Stage IIIb right colon cancer: Pathology results reviewed, confirming stage  of disease.  CT scan results from Oct 28, 2019 reviewed independently  with no obvious evidence of metastatic disease.  Port has been removed.  She only received 11 treatments of FOLFOX secondary to declining performance status completing on April 25, 2020.  Repeat CT scan in August 2022.  Follow-up 1-2 days later for further evaluation and discussion of her results. 2.  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  She completed 5 years of anastrozole in June 2016.  Her most recent mammogram 1 February 03, 2020 was reported as BI-RADS 1.  Repeat in August 2022 along with CT scan as above. 3.  Anemia: Slowly improving.  Patient's hemoglobin is 11.1 today. 4.  Thrombocytopenia: Slowly improving.  Patient platelet count is 134 today.  5.  Heart disease: Continue [facility] as scheduled.   Patient expressed understanding and was in agreement with this plan. She also understands that She can call clinic at any time with any questions, concerns, or complaints.   Cancer Staging Colon cancer [facility]) Staging form: Colon and Rectum, AJCC 8th Edition - Clinical stage from 11/13/2019: Stage IIIB (cT3, cN1a, cM0) - Signed by Lloyd Huger, MD on 11/13/2019 Total positive nodes: 1   Lloyd Huger, MD   09/22/2020 6:15 AM

## 2020-09-20 ENCOUNTER — Other Ambulatory Visit: Payer: Self-pay

## 2020-09-20 ENCOUNTER — Inpatient Hospital Stay: Payer: Medicare Other | Attending: Oncology

## 2020-09-20 ENCOUNTER — Inpatient Hospital Stay (HOSPITAL_BASED_OUTPATIENT_CLINIC_OR_DEPARTMENT_OTHER): Payer: Medicare Other | Admitting: Oncology

## 2020-09-20 VITALS — BP 137/79 | HR 86 | Temp 98.6°F | Resp 16 | Wt 152.0 lb

## 2020-09-20 DIAGNOSIS — Z9189 Other specified personal risk factors, not elsewhere classified: Secondary | ICD-10-CM

## 2020-09-20 DIAGNOSIS — Z85828 Personal history of other malignant neoplasm of skin: Secondary | ICD-10-CM | POA: Insufficient documentation

## 2020-09-20 DIAGNOSIS — I10 Essential (primary) hypertension: Secondary | ICD-10-CM | POA: Diagnosis not present

## 2020-09-20 DIAGNOSIS — Z79811 Long term (current) use of aromatase inhibitors: Secondary | ICD-10-CM | POA: Diagnosis not present

## 2020-09-20 DIAGNOSIS — C182 Malignant neoplasm of ascending colon: Secondary | ICD-10-CM | POA: Diagnosis not present

## 2020-09-20 DIAGNOSIS — Z85038 Personal history of other malignant neoplasm of large intestine: Secondary | ICD-10-CM | POA: Diagnosis not present

## 2020-09-20 DIAGNOSIS — C50911 Malignant neoplasm of unspecified site of right female breast: Secondary | ICD-10-CM | POA: Insufficient documentation

## 2020-09-20 DIAGNOSIS — D696 Thrombocytopenia, unspecified: Secondary | ICD-10-CM | POA: Diagnosis not present

## 2020-09-20 DIAGNOSIS — Z170 Estrogen receptor positive status [ER+]: Secondary | ICD-10-CM | POA: Diagnosis not present

## 2020-09-20 DIAGNOSIS — M8580 Other specified disorders of bone density and structure, unspecified site: Secondary | ICD-10-CM | POA: Insufficient documentation

## 2020-09-20 DIAGNOSIS — Z7982 Long term (current) use of aspirin: Secondary | ICD-10-CM | POA: Insufficient documentation

## 2020-09-20 DIAGNOSIS — I429 Cardiomyopathy, unspecified: Secondary | ICD-10-CM | POA: Diagnosis not present

## 2020-09-20 DIAGNOSIS — D649 Anemia, unspecified: Secondary | ICD-10-CM | POA: Insufficient documentation

## 2020-09-20 DIAGNOSIS — Z923 Personal history of irradiation: Secondary | ICD-10-CM | POA: Insufficient documentation

## 2020-09-20 DIAGNOSIS — E039 Hypothyroidism, unspecified: Secondary | ICD-10-CM | POA: Insufficient documentation

## 2020-09-20 DIAGNOSIS — Z79899 Other long term (current) drug therapy: Secondary | ICD-10-CM | POA: Insufficient documentation

## 2020-09-20 DIAGNOSIS — Z1231 Encounter for screening mammogram for malignant neoplasm of breast: Secondary | ICD-10-CM

## 2020-09-20 LAB — CBC WITH DIFFERENTIAL/PLATELET
Abs Immature Granulocytes: 0.02 10*3/uL (ref 0.00–0.07)
Basophils Absolute: 0 10*3/uL (ref 0.0–0.1)
Basophils Relative: 1 %
Eosinophils Absolute: 0.3 10*3/uL (ref 0.0–0.5)
Eosinophils Relative: 6 %
HCT: 33.8 % — ABNORMAL LOW (ref 36.0–46.0)
Hemoglobin: 11.1 g/dL — ABNORMAL LOW (ref 12.0–15.0)
Immature Granulocytes: 0 %
Lymphocytes Relative: 22 %
Lymphs Abs: 1.1 10*3/uL (ref 0.7–4.0)
MCH: 31.5 pg (ref 26.0–34.0)
MCHC: 32.8 g/dL (ref 30.0–36.0)
MCV: 96 fL (ref 80.0–100.0)
Monocytes Absolute: 0.6 10*3/uL (ref 0.1–1.0)
Monocytes Relative: 12 %
Neutro Abs: 3 10*3/uL (ref 1.7–7.7)
Neutrophils Relative %: 59 %
Platelets: 134 10*3/uL — ABNORMAL LOW (ref 150–400)
RBC: 3.52 MIL/uL — ABNORMAL LOW (ref 3.87–5.11)
RDW: 14.4 % (ref 11.5–15.5)
WBC: 5 10*3/uL (ref 4.0–10.5)
nRBC: 0 % (ref 0.0–0.2)

## 2020-09-20 LAB — COMPREHENSIVE METABOLIC PANEL
ALT: 14 U/L (ref 0–44)
AST: 26 U/L (ref 15–41)
Albumin: 3.7 g/dL (ref 3.5–5.0)
Alkaline Phosphatase: 121 U/L (ref 38–126)
Anion gap: 10 (ref 5–15)
BUN: 13 mg/dL (ref 8–23)
CO2: 28 mmol/L (ref 22–32)
Calcium: 9.3 mg/dL (ref 8.9–10.3)
Chloride: 104 mmol/L (ref 98–111)
Creatinine, Ser: 0.81 mg/dL (ref 0.44–1.00)
GFR, Estimated: >60 mL/min (ref >60)
Glucose, Bld: 103 mg/dL — ABNORMAL HIGH (ref 70–99)
Potassium: 4 mmol/L (ref 3.5–5.1)
Sodium: 142 mmol/L (ref 135–145)
Total Bilirubin: 0.9 mg/dL (ref 0.3–1.2)
Total Protein: 6.8 g/dL (ref 6.5–8.1)

## 2020-09-20 NOTE — Progress Notes (Signed)
Survivorship Care Plan visit completed.  Treatment summary reviewed and given to patient.  ASCO answers booklet reviewed and given to patient.  CARE program and Cancer Transitions discussed with patient along with other resources cancer center offers to patients and caregivers.  Patient verbalized understanding.    

## 2020-09-20 NOTE — Progress Notes (Signed)
Patient here for follow up she has recently had heart surgery at [facility]. Valve replacement and pacemaker placed.

## 2020-10-03 DIAGNOSIS — Z48812 Encounter for surgical aftercare following surgery on the circulatory system: Secondary | ICD-10-CM | POA: Diagnosis not present

## 2020-10-03 DIAGNOSIS — Z7982 Long term (current) use of aspirin: Secondary | ICD-10-CM | POA: Diagnosis not present

## 2020-10-03 DIAGNOSIS — Z79899 Other long term (current) drug therapy: Secondary | ICD-10-CM | POA: Diagnosis not present

## 2020-10-03 DIAGNOSIS — Z952 Presence of prosthetic heart valve: Secondary | ICD-10-CM | POA: Diagnosis not present

## 2020-10-10 DIAGNOSIS — I10 Essential (primary) hypertension: Secondary | ICD-10-CM | POA: Diagnosis not present

## 2020-10-10 DIAGNOSIS — Z952 Presence of prosthetic heart valve: Secondary | ICD-10-CM | POA: Diagnosis not present

## 2020-10-10 DIAGNOSIS — I442 Atrioventricular block, complete: Secondary | ICD-10-CM | POA: Diagnosis not present

## 2020-10-12 DIAGNOSIS — E0590 Thyrotoxicosis, unspecified without thyrotoxic crisis or storm: Secondary | ICD-10-CM | POA: Diagnosis not present

## 2020-11-11 ENCOUNTER — Other Ambulatory Visit: Payer: Self-pay | Admitting: Family Medicine

## 2020-11-11 DIAGNOSIS — I10 Essential (primary) hypertension: Secondary | ICD-10-CM

## 2020-12-13 DIAGNOSIS — Z950 Presence of cardiac pacemaker: Secondary | ICD-10-CM | POA: Insufficient documentation

## 2020-12-13 DIAGNOSIS — I442 Atrioventricular block, complete: Secondary | ICD-10-CM | POA: Diagnosis not present

## 2021-01-02 ENCOUNTER — Telehealth: Payer: Self-pay

## 2021-01-02 ENCOUNTER — Other Ambulatory Visit: Payer: Self-pay | Admitting: Family Medicine

## 2021-01-02 DIAGNOSIS — I10 Essential (primary) hypertension: Secondary | ICD-10-CM

## 2021-01-02 NOTE — Telephone Encounter (Signed)
Pt is scheduled 8/29

## 2021-01-02 NOTE — Telephone Encounter (Signed)
Requested medication (s) are due for refill today: see encounter   Requested medication (s) are on the active medication list: no. Norvasc 2.5 mg on list not 5 mg as requested  Last refill: norvasc 2.5mg   11/11/20 #90 0 refills  Future visit scheduled: yes 1 month  Notes to clinic:  see encounter . 2.5 mg on med list patient request is for 5 mg norvasc     Requested Prescriptions  Pending Prescriptions Disp Refills   amLODipine (NORVASC) 2.5 MG tablet 90 tablet 0    Sig: Take 1 tablet (2.5 mg total) by mouth daily.      Cardiovascular:  Calcium Channel Blockers Passed - 01/02/2021  4:05 PM      Passed - Last BP in normal range    BP Readings from Last 1 Encounters:  09/20/20 137/79          Passed - Valid encounter within last 6 months    Recent Outpatient Visits           4 months ago Hypertension, unspecified type   [facility], Megan P, DO   10 months ago Hypertension, unspecified type   [place], [place], DO   1 year ago Hypertension, unspecified type   [place], [place], DO   1 year ago Hypertension, unspecified type   [facility] Volney American, [place]   2 years ago Aortic stenosis, mild   [facility] [place], Jeannette How, MD       Future Appointments             In 1 month Johnson, Barb Merino, DO [place], [place]   In 7 months  [employer] [employer], [facility]

## 2021-01-02 NOTE — Telephone Encounter (Signed)
Medication Refill - Medication: amlodpine 5 mg  Has the patient contacted their pharmacy? yes (Agent: If no, request that the patient contact the pharmacy for the refill.) (Agent: If yes, when and what did the pharmacy advise?)contact pcp  Preferred Pharmacy (with phone number or street name):  [facility], [place]. Phone:  [phone]  Fax:  [phone]      Agent: Please be advised that RX refills may take up to 3 business days. We ask that you follow-up with your pharmacy.

## 2021-01-02 NOTE — Telephone Encounter (Signed)
Copied from [place] [id number]. Topic: General - Other >> Jan 02, 2021  3:46 PM Bayard Beaver wrote: Reason for [id number] says dr at [facility], Dr Sheila Oats, raised her amlodipine medicine to 5 mg. I put in medication refill for this request

## 2021-01-02 NOTE — Telephone Encounter (Signed)
Pt has apt on 02/13/2021

## 2021-01-03 ENCOUNTER — Ambulatory Visit: Payer: Self-pay | Admitting: *Deleted

## 2021-01-03 MED ORDER — AMLODIPINE BESYLATE 5 MG PO TABS: 5.0000 mg | ORAL_TABLET | Freq: Every day | ORAL | 0 refills | Status: DC

## 2021-01-03 NOTE — Telephone Encounter (Signed)
Reason for Disposition  [1] HIGH RISK for severe COVID complications (e.g., weak immune system, age > 29 years, obesity with BMI > 25, pregnant, chronic lung disease or other chronic medical condition) AND [2] COVID symptoms (e.g., cough, fever)  (Exceptions: Already seen by PCP and no new or worsening symptoms.)  Answer Assessment - Initial Assessment Questions 1. COVID-19 DIAGNOSIS: "Who made your COVID-19 diagnosis?" "Was it confirmed by a positive lab test or self-test?" If not diagnosed by a doctor (or NP/PA), ask "Are there lots of cases (community spread) where you live?" Note: See public health department website, if unsure.     Tested positive for Covid on home test yesterday 2. COVID-19 EXPOSURE: "Was there any known exposure to COVID before the symptoms began?" CDC Definition of close contact: within 6 feet (2 meters) for a total of 15 minutes or more over a 24-hour period.     Friday evening at a party 3. ONSET: "When did the COVID-19 symptoms start?"      Sunday evening 4. WORST SYMPTOM: "What is your worst symptom?" (e.g., cough, fever, shortness of breath, muscle aches)     Runny nose, headache, nausea, nasal congestion and post nasal drainage, a mild cough.   No fever.   Yesterday body aches and chills but not today.   No diarrhea.   5. COUGH: "Do you have a cough?" If Yes, ask: "How bad is the cough?"       Yes  6. FEVER: "Do you have a fever?" If Yes, ask: "What is your temperature, how was it measured, and when did it start?"     No 7. RESPIRATORY STATUS: "Describe your breathing?" (e.g., shortness of breath, wheezing, unable to speak)      No 8. BETTER-SAME-WORSE: "Are you getting better, staying the same or getting worse compared to yesterday?"  If getting worse, ask, "In what way?"     etterVID-19 vaccine?" If Yes, ask: "Which one, how many shots, when did you get it?"       *No Answer* 11. BOOSTER: "Have you received your COVID-19 booster?" If Yes, ask: "Which one and when  did you get it?"       *No Answer* 12. PREGNANCY: "Is there any chance you are pregnant?" "When was your last menstrual period?"       N/A 13. OTHER SYMPTOMS: "Do you have any other symptoms?"  (e.g., chills, fatigue, headache, loss of smell or taste, muscle pain, sore throat)       See above 14. O2 SATURATION MONITOR:  "Do you use an oxygen saturation monitor (pulse oximeter) at home?" If Yes, ask "What is your reading (oxygen level) today?" "What is your usual oxygen saturation reading?" (e.g., 95%)       No  Protocols used: Coronavirus (COVID-19) Diagnosed or Suspected-A-AH

## 2021-01-03 NOTE — Telephone Encounter (Signed)
I'm a little unclear at what she needs? Does she need a refill or just letting me know

## 2021-01-03 NOTE — Addendum Note (Signed)
Addended by: Valerie Roys on: 01/03/2021 02:33 PM   Modules accepted: Orders

## 2021-01-03 NOTE — Telephone Encounter (Signed)
I returned pt's call.   She tested positive yesterday on a home Covid test.   She was exposed at a party Friday evening.  Her symptoms started Sunday evening.  She has a runny nose, post nasal drip and a headache, a mild cough.   Possibly had fever yesterday but doesn't feel like it today.   "I actually feel a little better today".   She had some nausea yesterday but no vomiting or diarrhea.  Had body aches and chills yesterday but not today.  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  So she is inquiring about taking an antiviral.   She prefers the pill versus the infusion but if Dr. Wynetta Emery feels the infusion is better in her situation she is open to doing that.   Taking the antiviral in pill form would be much easier on her per pt.     I sent a note to [facility] to Dr. Park Liter with pt's request.   Pt does not do video visits but can do phone calls.  She can be reached at [phone].

## 2021-01-03 NOTE — Telephone Encounter (Signed)
Spoke with patient and she informed me that she is requesting a refill on her prescription for Amlodipine 5 mg. Patient states the prescription was increased from 2.5 mg to 5 mg and she had to double up on her medication and she ran out of medication.

## 2021-01-04 ENCOUNTER — Encounter: Payer: Self-pay | Admitting: Oncology

## 2021-01-04 ENCOUNTER — Other Ambulatory Visit: Payer: Self-pay

## 2021-01-04 ENCOUNTER — Ambulatory Visit (INDEPENDENT_AMBULATORY_CARE_PROVIDER_SITE_OTHER): Payer: Medicare Other | Admitting: Family Medicine

## 2021-01-04 ENCOUNTER — Telehealth: Payer: Self-pay | Admitting: Family Medicine

## 2021-01-04 ENCOUNTER — Encounter: Payer: Self-pay | Admitting: Family Medicine

## 2021-01-04 DIAGNOSIS — U071 COVID-19: Secondary | ICD-10-CM | POA: Diagnosis not present

## 2021-01-04 MED ORDER — MOLNUPIRAVIR EUA 200MG CAPSULE
4.0000 | ORAL_CAPSULE | Freq: Two times a day (BID) | ORAL | 0 refills | Status: AC
  Filled 2021-01-04: qty 40, 5d supply, fill #0

## 2021-01-04 NOTE — Progress Notes (Signed)
There were no vitals taken for this visit.   Subjective:    Patient ID: Maria Davies, female    DOB: 1941-05-12, 80 y.o.   MRN: [id number]  HPI: Maria Davies is a 80 y.o. female  Chief Complaint  Patient presents with   Covid Positive    Patient states she tested positive for COVID on Monday. Patient states she has runny nose, and headache.    UPPER RESPIRATORY TRACT INFECTION- Tested positive on Monday Duration: 3 days Worst symptom: drippy nose Fever: yes- chills and sweats Cough: yes Shortness of breath: no Wheezing: no Chest pain: no Chest tightness: no Chest congestion: no Nasal congestion: yes Runny nose: yes Post nasal drip: no Sneezing: no Sore throat: no Swollen glands: no Sinus pressure: no Headache: yes Face pain: no Toothache: no Ear pain: no  Ear pressure: no  Eyes red/itching:no Eye drainage/crusting: no  Vomiting: no Rash: no Fatigue: yes Sick contacts: yes Strep contacts: no  Context: stable Recurrent sinusitis: no Relief with OTC cold/cough medications: no  Treatments attempted: tylenol   Relevant past medical, surgical, family and social history reviewed and updated as indicated. Interim medical history since our last visit reviewed. Allergies and medications reviewed and updated.  Review of Systems  Constitutional:  Positive for fatigue and fever. Negative for activity change, appetite change, chills, diaphoresis and unexpected weight change.  HENT:  Positive for congestion, postnasal drip and rhinorrhea. Negative for dental problem, drooling, ear discharge, ear pain, facial swelling, hearing loss, mouth sores, nosebleeds, sinus pressure, sinus pain, sneezing, sore throat, tinnitus, trouble swallowing and voice change.   Eyes: Negative.   Respiratory: Negative.    Cardiovascular: Negative.   Psychiatric/Behavioral: Negative.     Per HPI unless specifically indicated above     Objective:    There were no vitals taken for this  visit.  Wt Readings from Last 3 Encounters:  09/20/20 152 lb (68.9 kg)  08/19/20 145 lb (65.8 kg)  08/15/20 146 lb 9.6 oz (66.5 kg)    Physical Exam Vitals and nursing note reviewed.  Pulmonary:     Effort: Pulmonary effort is normal. No respiratory distress.     Comments: Speaking in full sentences Neurological:     Mental Status: She is alert.  Psychiatric:        Mood and Affect: Mood normal.        Behavior: Behavior normal.        Thought Content: Thought content normal.        Judgment: Judgment normal.    Results for orders placed or performed in visit on 09/20/20  Comprehensive metabolic panel  Result Value Ref Range   Sodium 142 135 - 145 mmol/L   Potassium 4.0 3.5 - 5.1 mmol/L   Chloride 104 98 - 111 mmol/L   CO2 28 22 - 32 mmol/L   Glucose, Bld 103 (H) 70 - 99 mg/dL   BUN 13 8 - 23 mg/dL   Creatinine, Ser 0.81 0.44 - 1.00 mg/dL   Calcium 9.3 8.9 - 10.3 mg/dL   Total Protein 6.8 6.5 - 8.1 g/dL   Albumin 3.7 3.5 - 5.0 g/dL   AST 26 15 - 41 U/L   ALT 14 0 - 44 U/L   Alkaline Phosphatase 121 38 - 126 U/L   Total Bilirubin 0.9 0.3 - 1.2 mg/dL   GFR, Estimated >60 >60 mL/min   Anion gap 10 5 - 15  CBC with Differential  Result Value Ref Range   WBC  5.0 4.0 - 10.5 K/uL   RBC 3.52 (L) 3.87 - 5.11 MIL/uL   Hemoglobin 11.1 (L) 12.0 - 15.0 g/dL   HCT 33.8 (L) 36.0 - 46.0 %   MCV 96.0 80.0 - 100.0 fL   MCH 31.5 26.0 - 34.0 pg   MCHC 32.8 30.0 - 36.0 g/dL   RDW 14.4 11.5 - 15.5 %   Platelets 134 (L) 150 - 400 K/uL   nRBC 0.0 0.0 - 0.2 %   Neutrophils Relative % 59 %   Neutro Abs 3.0 1.7 - 7.7 K/uL   Lymphocytes Relative 22 %   Lymphs Abs 1.1 0.7 - 4.0 K/uL   Monocytes Relative 12 %   Monocytes Absolute 0.6 0.1 - 1.0 K/uL   Eosinophils Relative 6 %   Eosinophils Absolute 0.3 0.0 - 0.5 K/uL   Basophils Relative 1 %   Basophils Absolute 0.0 0.0 - 0.1 K/uL   Immature Granulocytes 0 %   Abs Immature Granulocytes 0.02 0.00 - 0.07 K/uL      Assessment & Plan:    Problem List Items Addressed This Visit   None Visit Diagnoses     COVID-19    -  Primary   Will treat with mulnopirovir. Rx sent to [facility]. Call with any concerns or if not getting better. Continue to monitor.    Relevant Medications   molnupiravir EUA 200 mg CAPS        Follow up plan: Return if symptoms worsen or fail to improve.    This visit was completed via telephone due to the restrictions of the COVID-19 pandemic. All issues as above were discussed and addressed but no physical exam was performed. If it was felt that the patient should be evaluated in the office, they were directed there. The patient verbally consented to this visit. Patient was unable to complete an audio/visual visit due to Lack of equipment. Due to the catastrophic nature of the COVID-19 pandemic, this visit was done through audio contact only. Location of the patient: home Location of the provider: work Those involved with this call:  Provider: Park Liter, DO CMA: Louanna Raw, [place] Desk/Registration: Jill Side  Time spent on call:  21 minutes on the phone discussing health concerns. 30 minutes total spent in review of patient's record and preparation of their chart.

## 2021-01-04 NOTE — Telephone Encounter (Signed)
Pt scheduled for telephone call today

## 2021-01-04 NOTE — Telephone Encounter (Signed)
Scheduled today

## 2021-01-04 NOTE — Telephone Encounter (Signed)
Patient states [facility] does not carry the anti viral COVID pill and would like PCP to send it to [facility], [place], [place] [id number]. Patient would like a follow up call when completed

## 2021-01-09 DIAGNOSIS — E0590 Thyrotoxicosis, unspecified without thyrotoxic crisis or storm: Secondary | ICD-10-CM | POA: Diagnosis not present

## 2021-01-13 DIAGNOSIS — E0590 Thyrotoxicosis, unspecified without thyrotoxic crisis or storm: Secondary | ICD-10-CM | POA: Diagnosis not present

## 2021-01-19 DIAGNOSIS — I350 Nonrheumatic aortic (valve) stenosis: Secondary | ICD-10-CM | POA: Diagnosis not present

## 2021-01-19 DIAGNOSIS — Z952 Presence of prosthetic heart valve: Secondary | ICD-10-CM | POA: Diagnosis not present

## 2021-01-19 DIAGNOSIS — I442 Atrioventricular block, complete: Secondary | ICD-10-CM | POA: Diagnosis not present

## 2021-01-19 DIAGNOSIS — Z950 Presence of cardiac pacemaker: Secondary | ICD-10-CM | POA: Diagnosis not present

## 2021-01-19 DIAGNOSIS — I10 Essential (primary) hypertension: Secondary | ICD-10-CM | POA: Diagnosis not present

## 2021-01-20 ENCOUNTER — Ambulatory Visit
Admission: RE | Admit: 2021-01-20 | Discharge: 2021-01-20 | Disposition: A | Discharge status: Home / Self Care | Payer: Medicare Other | Source: Ambulatory Visit | Attending: Oncology | Admitting: Oncology

## 2021-01-20 ENCOUNTER — Other Ambulatory Visit: Payer: Self-pay

## 2021-01-20 DIAGNOSIS — I700 Atherosclerosis of aorta: Secondary | ICD-10-CM | POA: Diagnosis not present

## 2021-01-20 DIAGNOSIS — K5730 Diverticulosis of large intestine without perforation or abscess without bleeding: Secondary | ICD-10-CM | POA: Diagnosis not present

## 2021-01-20 DIAGNOSIS — K6289 Other specified diseases of anus and rectum: Secondary | ICD-10-CM | POA: Diagnosis not present

## 2021-01-20 DIAGNOSIS — C182 Malignant neoplasm of ascending colon: Secondary | ICD-10-CM | POA: Diagnosis not present

## 2021-01-20 DIAGNOSIS — C189 Malignant neoplasm of colon, unspecified: Secondary | ICD-10-CM | POA: Diagnosis not present

## 2021-01-20 LAB — POCT I-STAT CREATININE: Creatinine, Ser: 0.5 mg/dL (ref 0.44–1.00)

## 2021-01-20 IMAGING — CT CT ABD-PELV W/ CM
2 of 5 series · 16 of 46 positions shown, 18 images · IV contrast (omnipaque)
Comparison: None.

CLINICAL DATA: Colorectal cancer, surveillance, status post right
hemicolectomy and chemotherapy

EXAM:
CT ABDOMEN AND PELVIS WITH CONTRAST
TECHNIQUE: Multidetector CT imaging of the abdomen and pelvis was performed
using the standard protocol following bolus administration of
intravenous contrast.
CONTRAST:  85mL OMNIPAQUE IOHEXOL 350 MG/ML SOLN additional oral
enteric contrast

[Series 2: abd pelvis 5.00 · axial · 0.70mm/px · z∈[-1490,-1090]mm · 13 of 92 slices shown, 15 images]
[im 6/92  soft-tissue]
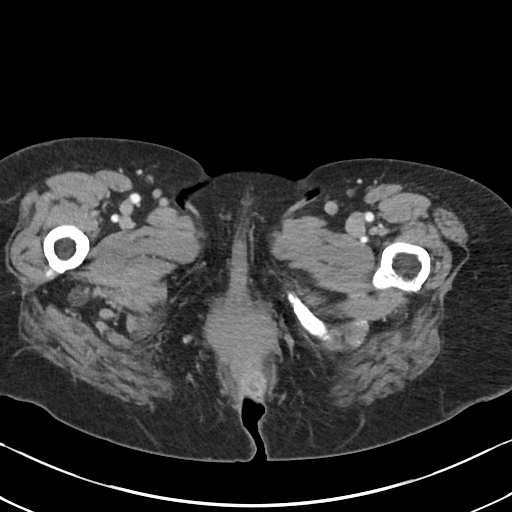
[im 6/92  bone]
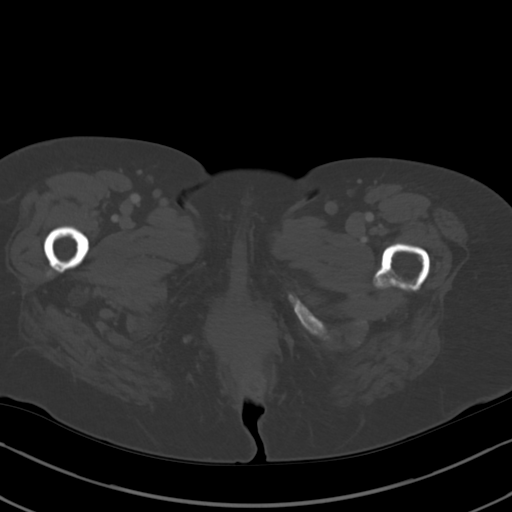
[im 11/92  soft-tissue]
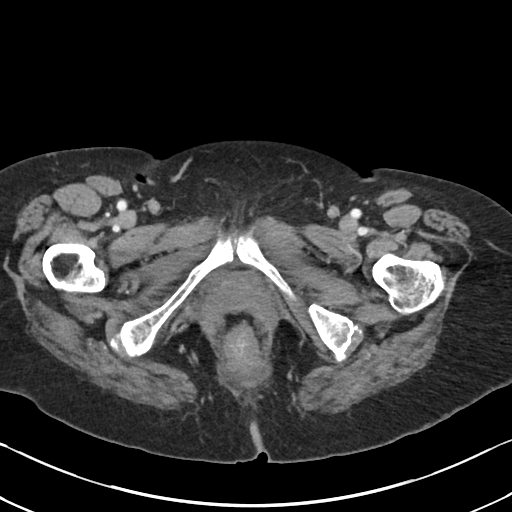
[im 21/92  soft-tissue]
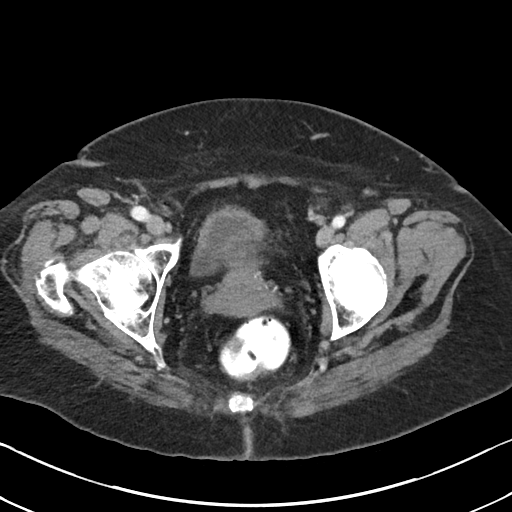
[im 26/92  soft-tissue]
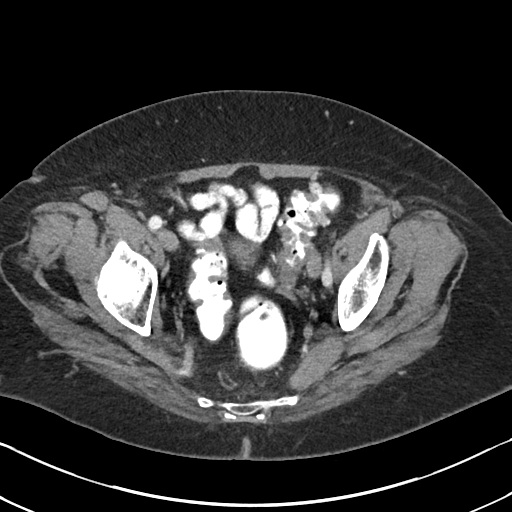
[im 31/92  soft-tissue]
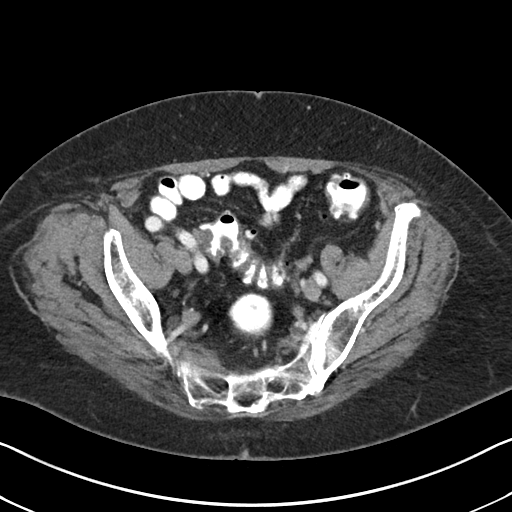
[im 41/92  soft-tissue]
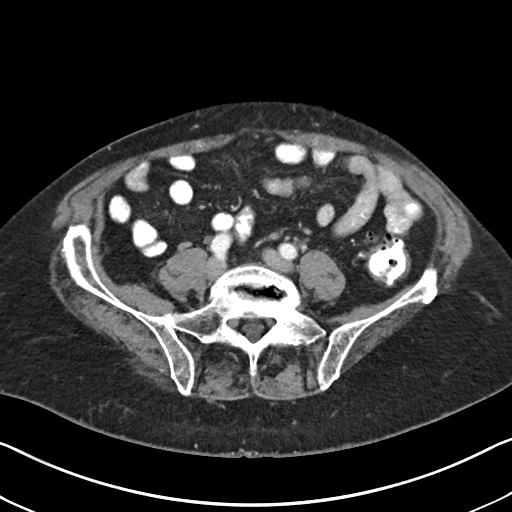
[im 46/92  soft-tissue]
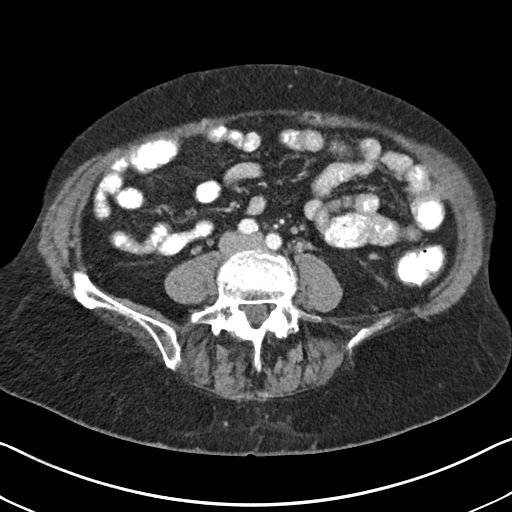
[im 51/92  soft-tissue]
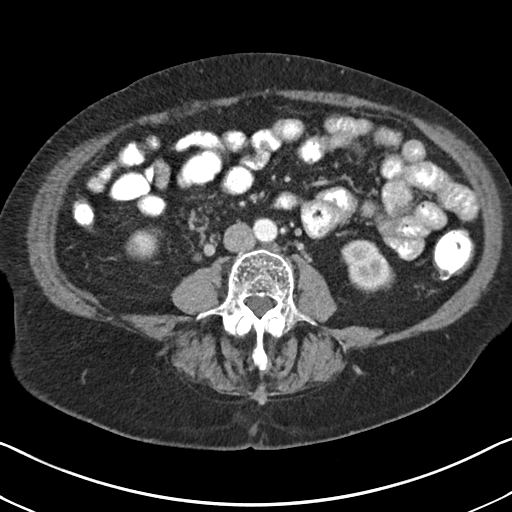
[im 61/92  soft-tissue]
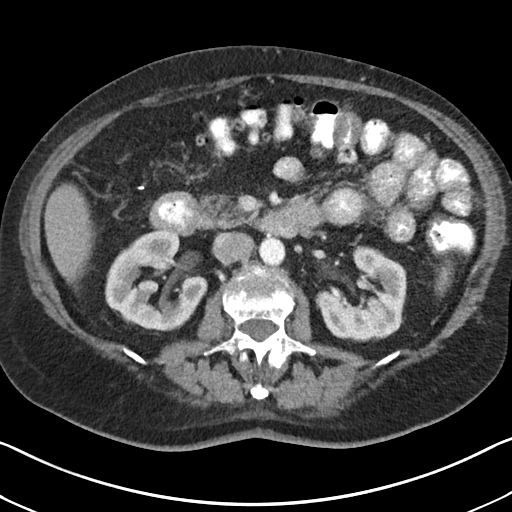
[im 61/92  bone]
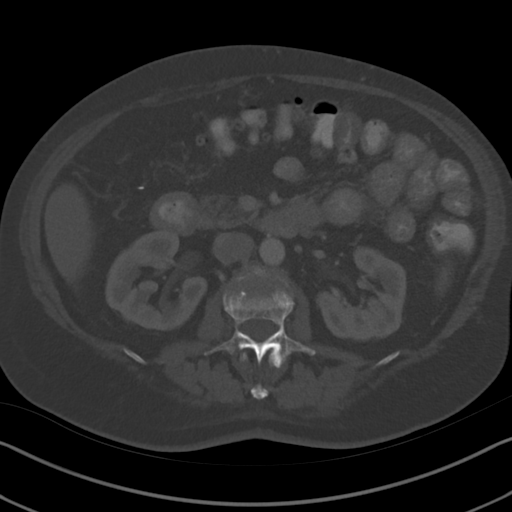
[im 66/92  soft-tissue]
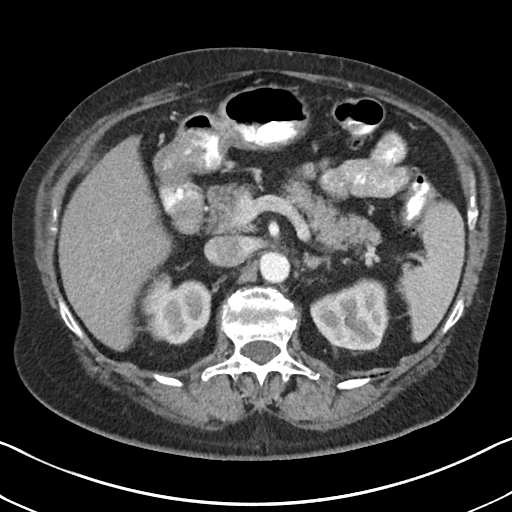
[im 71/92  soft-tissue]
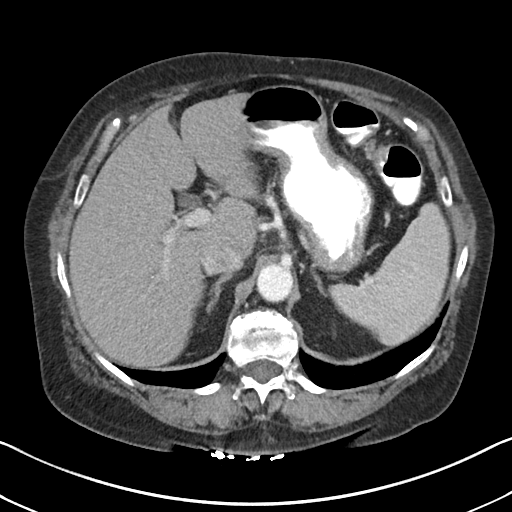
[im 81/92  soft-tissue]
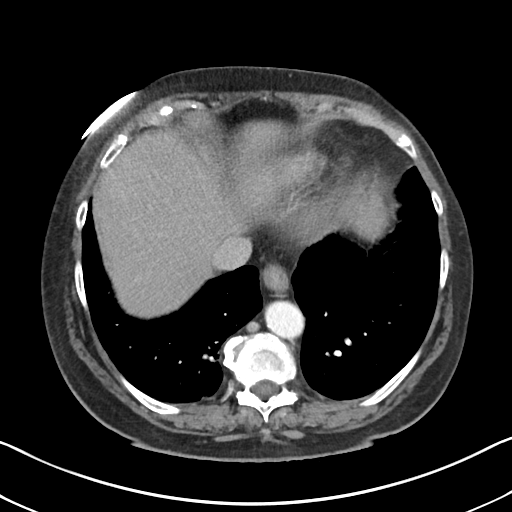
[im 86/92  soft-tissue]
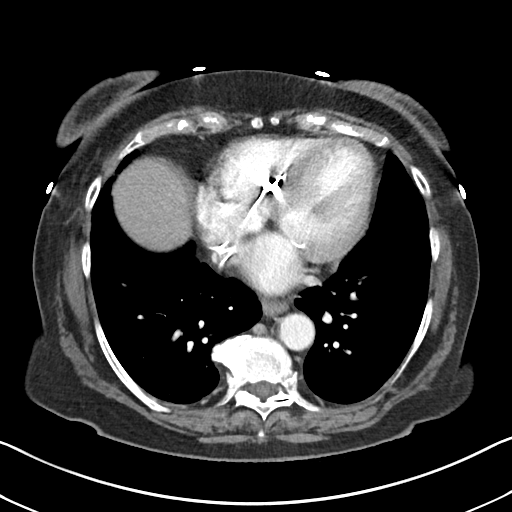

[Series 4: coronals abd pelvis 2.00 cor · coronal · 0.70mm/px · 3 of 150 slices shown]
[im 50/150  soft-tissue]
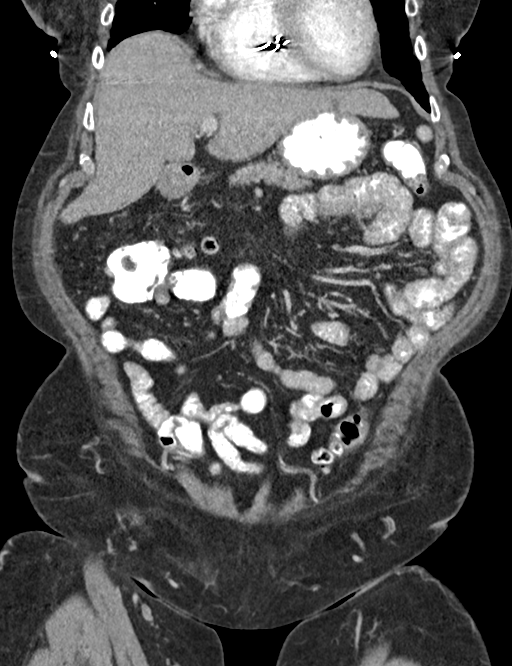
[im 67/150  soft-tissue]
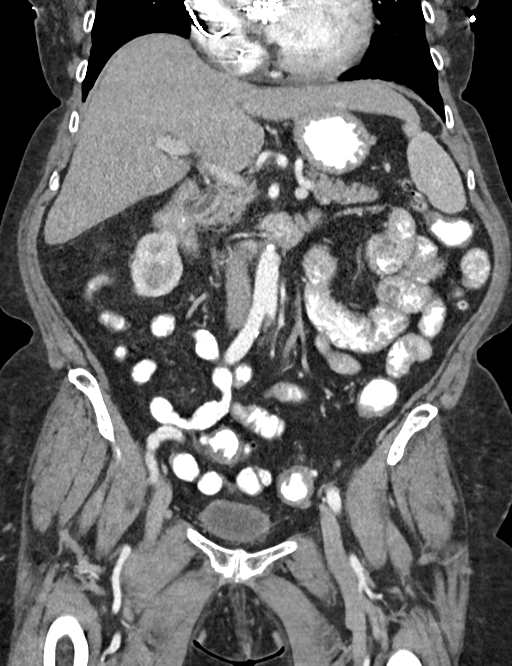
[im 83/150  soft-tissue]
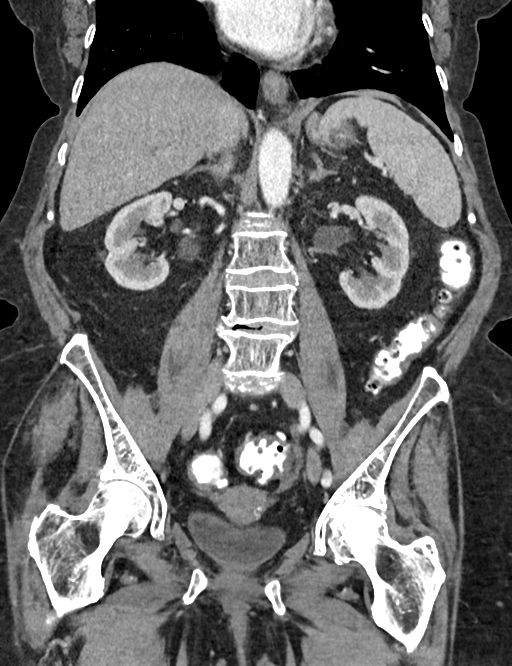

[16 of 46 positions shown; findings below may reference images not displayed]

FINDINGS: Lower chest: No acute abnormality. Cardiomegaly. Partially imaged
aortic valve stent endograft. Stable, benign small nodules in the
left left lung base measuring 0.3 cm (series 3, image 7, 13).

Hepatobiliary: No focal liver abnormality is seen. Status post
cholecystectomy. No biliary dilatation.

Pancreas: Unremarkable. No pancreatic ductal dilatation or
surrounding inflammatory changes.

Spleen: Normal in size without significant abnormality.

Adrenals/Urinary Tract: Adrenal glands are unremarkable. Kidneys are
normal, without renal calculi, solid lesion, or hydronephrosis.
Bladder is unremarkable.

Stomach/Bowel: Stomach is within normal limits. Status post partial
right hemicolectomy and reanastomosis. No evidence of bowel wall
thickening, distention, or inflammatory changes. Pancolonic
diverticulosis. Soft tissue thickening at the anus (series 2, image
88).

Vascular/Lymphatic: Aortic atherosclerosis. No enlarged abdominal or
pelvic lymph nodes.

Reproductive: No mass or other significant abnormality.

Other: No abdominal wall hernia or abnormality. No abdominopelvic
ascites.

Musculoskeletal: No acute or significant osseous findings.
IMPRESSION: 1. Status post partial right hemicolectomy and reanastomosis. No
evidence of mass, lymphadenopathy, or metastatic disease in the
abdomen or pelvis.
2. Stable 3 mm pulmonary nodules in the left lung base, benign,
incidental sequelae of prior infection or inflammation.
3. Soft tissue thickening at the anus, nonspecific, possibly
reflecting hemorrhoids. Correlate with physical exam.
4. Cardiomegaly. Partially imaged aortic valve stent endograft.

Aortic Atherosclerosis ([BP]-[BP]).

## 2021-01-20 MED ORDER — IOHEXOL 350 MG/ML SOLN
85.0000 mL | Freq: Once | INTRAVENOUS | Status: AC | PRN
  Administered 2021-01-20: 85 mL via INTRAVENOUS

## 2021-01-24 DIAGNOSIS — I442 Atrioventricular block, complete: Secondary | ICD-10-CM | POA: Diagnosis not present

## 2021-02-03 ENCOUNTER — Other Ambulatory Visit: Payer: Self-pay

## 2021-02-03 ENCOUNTER — Ambulatory Visit
Admission: RE | Admit: 2021-02-03 | Discharge: 2021-02-03 | Disposition: A | Discharge status: Home / Self Care | Payer: Medicare Other | Source: Ambulatory Visit | Attending: Oncology | Admitting: Oncology

## 2021-02-03 DIAGNOSIS — Z1231 Encounter for screening mammogram for malignant neoplasm of breast: Secondary | ICD-10-CM | POA: Diagnosis not present

## 2021-02-03 DIAGNOSIS — Z9189 Other specified personal risk factors, not elsewhere classified: Secondary | ICD-10-CM

## 2021-02-03 IMAGING — MG MM DIGITAL SCREENING BILAT W/ TOMO AND CAD
6 of 10 series · 6 of 30 positions shown · non-contrast
Comparison: Previous exam(s).

CLINICAL DATA: Screening.

EXAM:
DIGITAL SCREENING BILATERAL MAMMOGRAM WITH TOMOSYNTHESIS AND CAD
TECHNIQUE: Bilateral screening digital craniocaudal and mediolateral oblique
mammograms were obtained. Bilateral screening digital breast
tomosynthesis was performed. The images were evaluated with
computer-aided detection.

[L MLO synth-2D (1 of 2)]
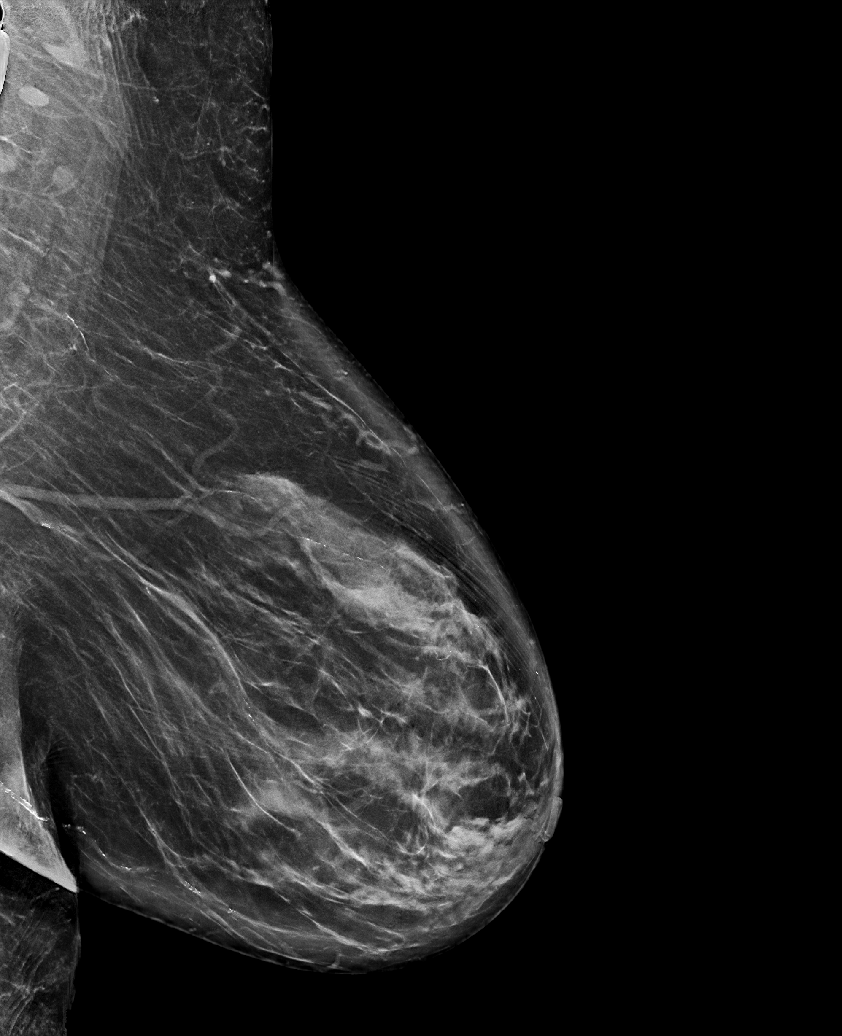

[R CC synth-2D]
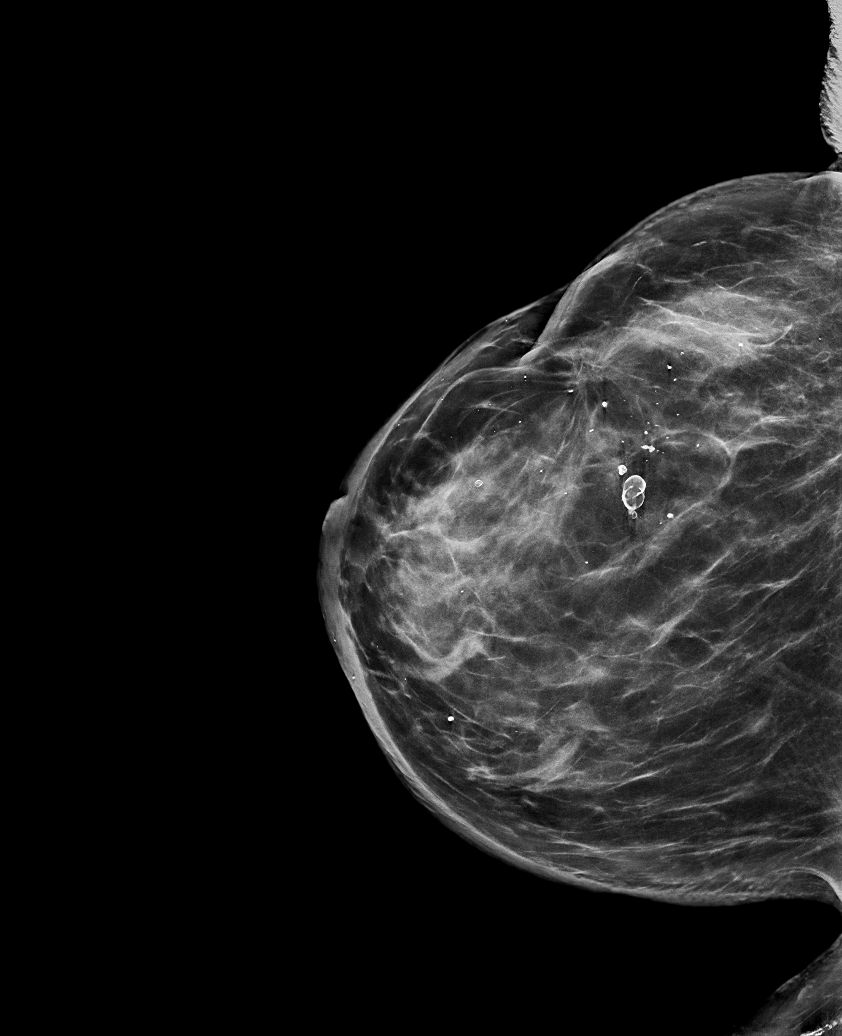

[L MLO synth-2D (2 of 2)]
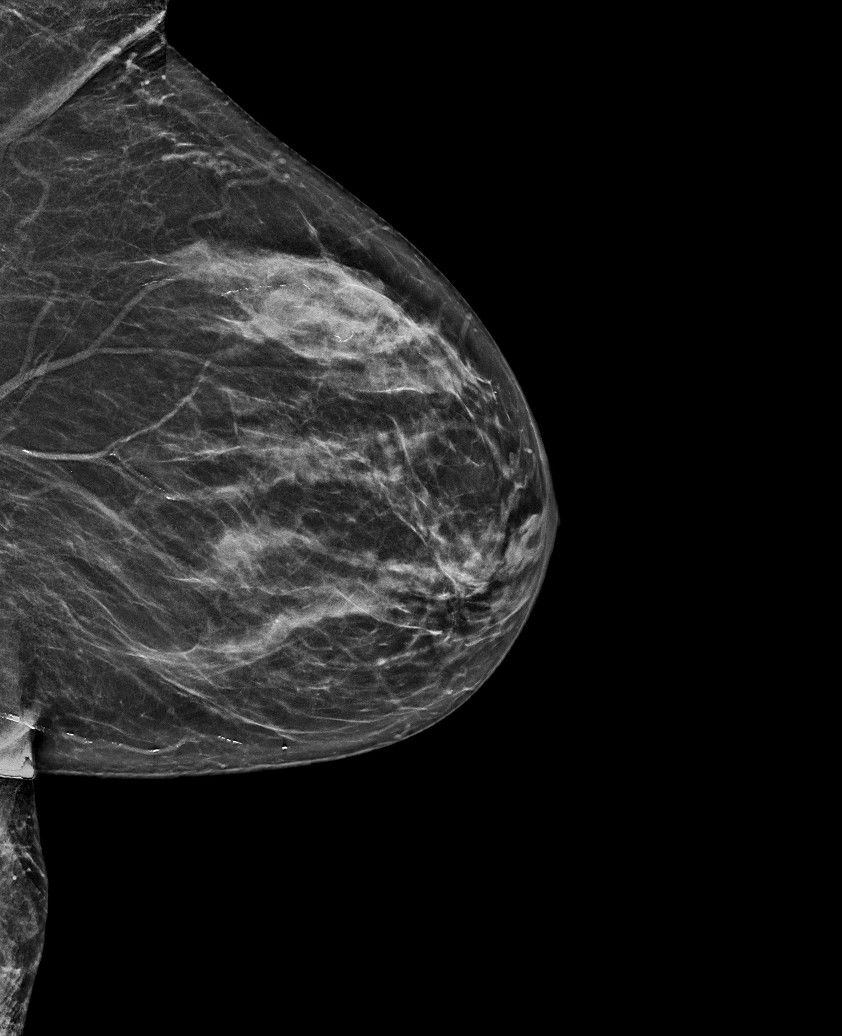

[L CC synth-2D]
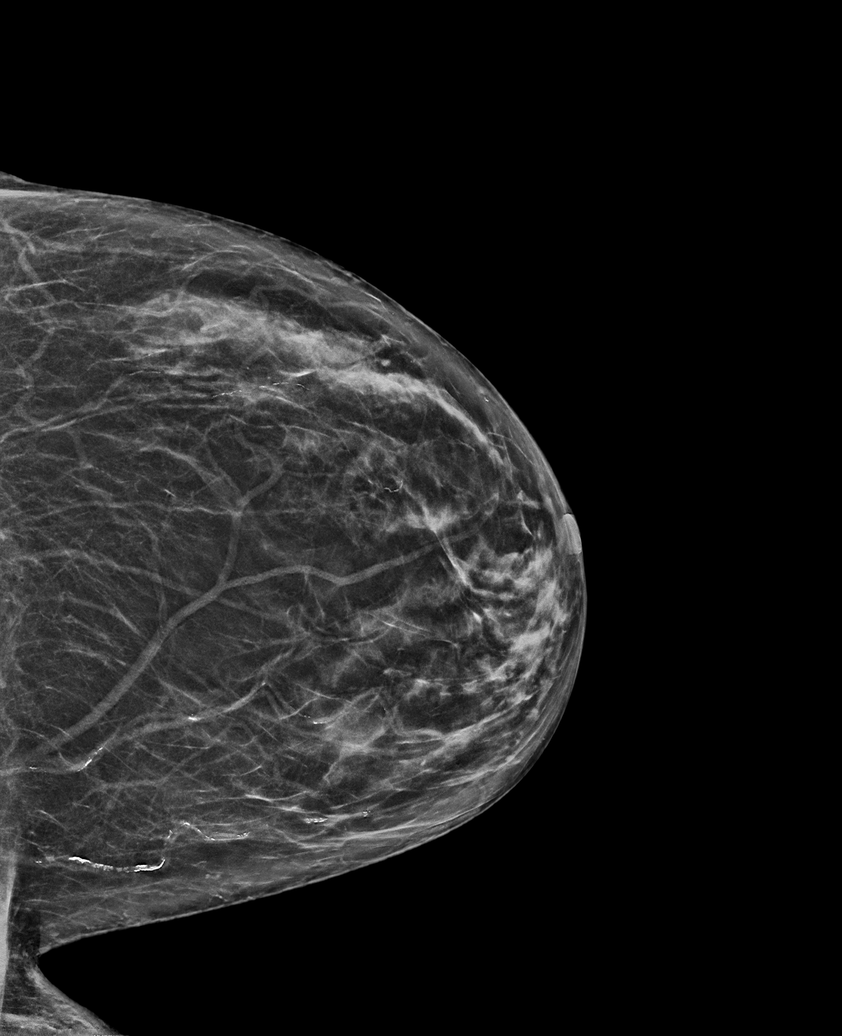

[R MLO synth-2D]
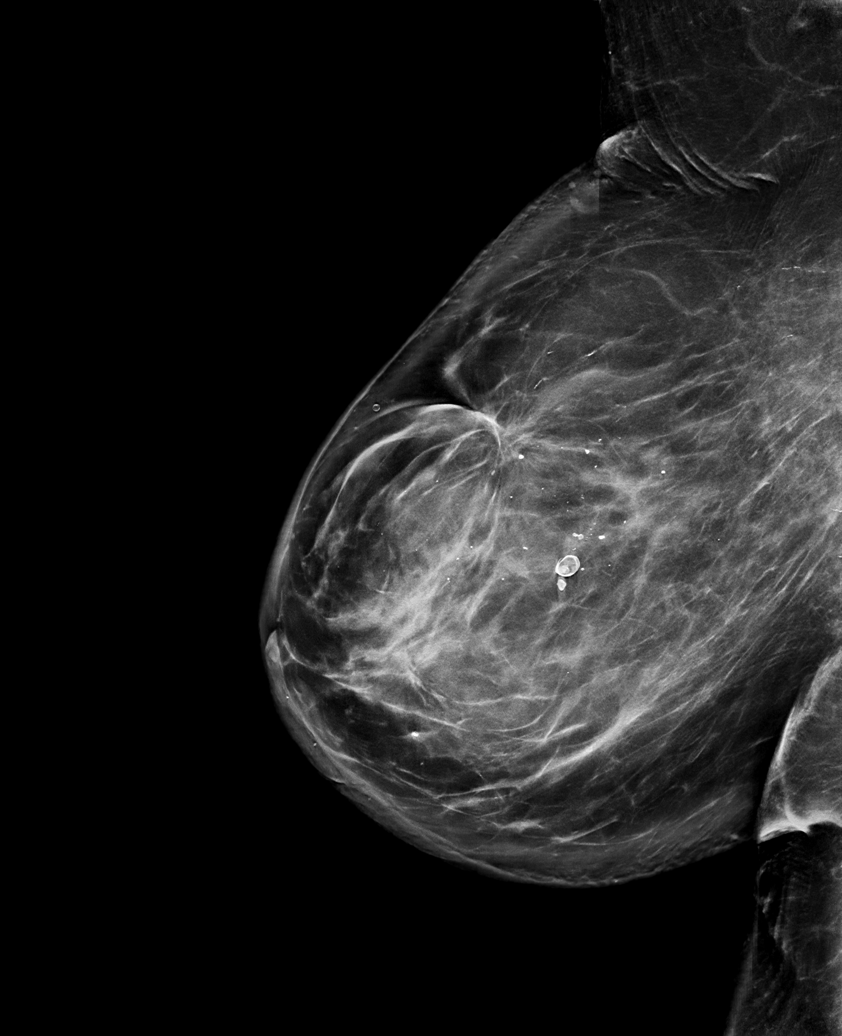

[L MLO tomo · tomo slice 30/59.0]
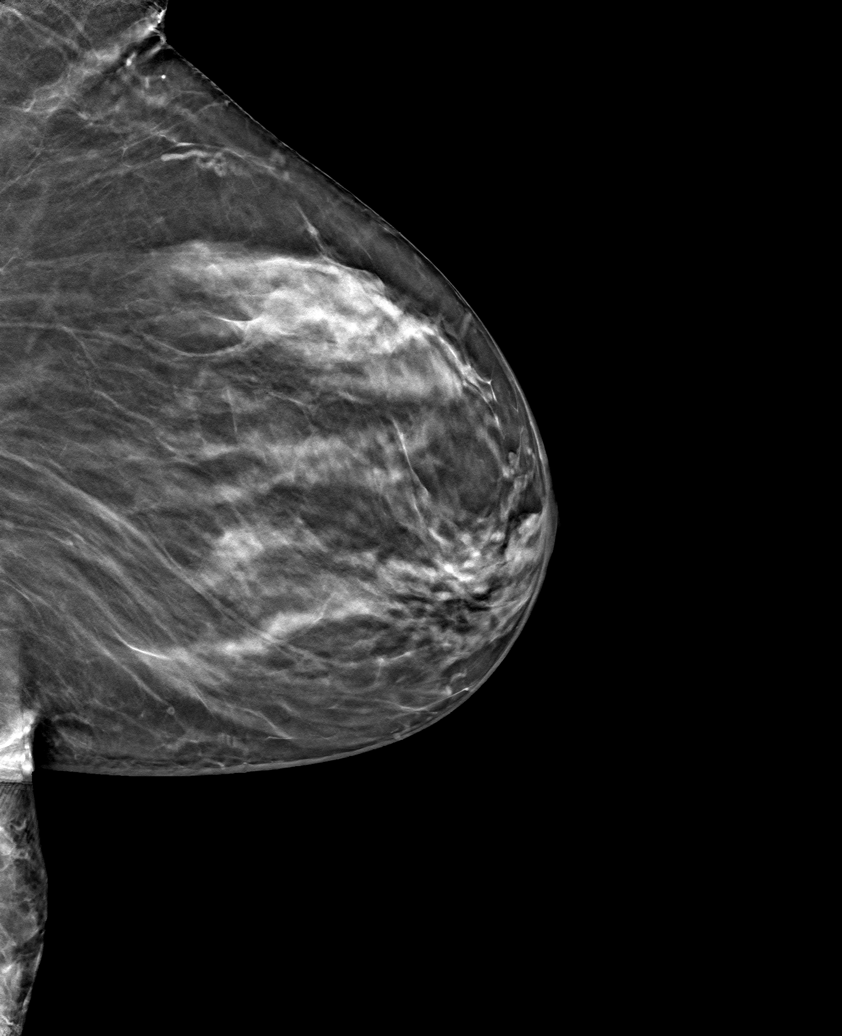

[6 of 30 positions shown; findings below may reference images not displayed]

ACR Breast Density Category c: The breast tissue is heterogeneously
dense, which may obscure small masses.
FINDINGS: There are no findings suspicious for malignancy.
IMPRESSION: No mammographic evidence of malignancy. A result letter of this
screening mammogram will be mailed directly to the patient.

RECOMMENDATION:
Screening mammogram in one year. (Code:[V2])

BI-RADS CATEGORY  1: Negative.

## 2021-02-03 NOTE — Progress Notes (Deleted)
[place]  Telephone:(336) [phone] Fax:(336) [phone]  ID: Maria Davies OB: 05-26-1941  MR#: [id number]  ZTI#:[id number]  Patient Care Team: Valerie Roys, DO as PCP - General (Family Medicine) Guadalupe Maple, MD as PCP - Family Medicine (Family Medicine) Ronny Bacon, MD as Consulting Physician (General Surgery) Lloyd Huger, MD as Consulting Physician (Oncology)   CHIEF COMPLAINT: Stage IIIb right colon cancer.  INTERVAL HISTORY: Patient returns to clinic today for routine 86-monthevaluation.  She currently feels well and is asymptomatic.  She denies any weakness or fatigue.  She has good appetite.  She has no neurologic complaints.  She denies any recent fevers or illnesses.  She has no chest pain, shortness of breath, cough, or hemoptysis. She denies any vomiting, constipation, or diarrhea.  She has no melena or hematochezia.  She has no urinary complaints.  Patient offers no specific complaints today.  REVIEW OF SYSTEMS:   Review of Systems  Constitutional: Negative.  Negative for fever, malaise/fatigue and weight loss.  Respiratory: Negative.  Negative for cough, hemoptysis and shortness of breath.   Cardiovascular: Negative.  Negative for chest pain and leg swelling.  Gastrointestinal: Negative.  Negative for abdominal pain, blood in stool, melena and nausea.  Genitourinary: Negative.  Negative for hematuria.  Musculoskeletal: Negative.  Negative for back pain.  Skin: Negative.  Negative for rash.  Neurological: Negative.  Negative for dizziness, focal weakness, weakness and headaches.  Psychiatric/Behavioral: Negative.  The patient is not nervous/anxious.   As per HPI. Otherwise, a complete review of systems is negative.   PAST MEDICAL HISTORY: Past Medical History:  Diagnosis Date   Anemia    Arthritis    Basal cell carcinoma    back   [place]   [place]   [place]   Chemotherapy induced  nausea and vomiting    [place]   [place] cancer   Heart murmur    Hypertension    Hypothyroidism    currently not on meds   Osteopenia    Personal history of chemotherapy 2010   right breast ca   Personal history of radiation therapy 2010   right breast ca   Port-A-Cath in place     PAST SURGICAL HISTORY: Past Surgical History:  Procedure Laterality Date   aortic valve stenosis  08/11/2020   BACK SURGERY     BREAST EXCISIONAL BIOPSY Right 03/2009   radiation and chemo +   BREAST EXCISIONAL BIOPSY Bilateral    NEG   BREAST SURGERY     CATARACT EXTRACTION W/ INTRAOCULAR LENS  IMPLANT, BILATERAL     CHOLECYSTECTOMY     COLONOSCOPY WITH PROPOFOL N/A 10/19/2019   Procedure: COLONOSCOPY WITH PROPOFOL-attempted, unable to perform poor prep;  Surgeon: WLucilla Lame MD;  [place] [facility]  Service: Endoscopy;  Laterality: N/A;  priority 3   COLONOSCOPY WITH PROPOFOL N/A 10/20/2019   Procedure: COLONOSCOPY WITH PROPOFOL;  Surgeon: WLucilla Lame MD;  [facility];  Service: Endoscopy;  Laterality: N/A;   ESOPHAGEAL DILATION  10/19/2019   Procedure: ESOPHAGEAL DILATION;  Surgeon: WLucilla Lame MD;  [place] [facility]  Service: Endoscopy;;   ESOPHAGOGASTRODUODENOSCOPY (EGD) WITH PROPOFOL N/A 10/19/2019   Procedure: ESOPHAGOGASTRODUODENOSCOPY (EGD) WITH PROPOFOL;  Surgeon: WLucilla Lame MD;  [place] [facility]  Service: Endoscopy;  Laterality: N/A;   PORTACATH PLACEMENT Right 11/23/2019   Procedure: INSERTION PORT-A-CATH;  Surgeon: RRonny Bacon MD;  [facility];  Service: General;  Laterality: Right;    FAMILY HISTORY: Family History  Problem Relation Age of Onset   Cancer Mother    Heart disease Father    Breast cancer Paternal Aunt 51    ADVANCED DIRECTIVES (Y/N):  N  HEALTH MAINTENANCE: Social History   Tobacco Use   Smoking status: Never   Smokeless tobacco: Never  Vaping Use   Vaping Use: Never used   Substance Use Topics   Alcohol use: Yes    Alcohol/week: 2.0 standard drinks    Types: 2 Standard drinks or equivalent per week    Comment: only on weeks not having treatment   Drug use: No     Colonoscopy:  PAP:  Bone density:  Lipid panel:  No Known Allergies  Current Outpatient Medications  Medication Sig Dispense Refill   acetaminophen (TYLENOL) 325 MG tablet Take 325 mg by mouth 3 (three) times daily as needed for moderate pain or headache.     amLODipine (NORVASC) 5 MG tablet Take 1 tablet (5 mg total) by mouth daily. 90 tablet 0   aspirin EC 81 MG tablet Take 81 mg by mouth daily.     benazepril (LOTENSIN) 40 MG tablet Take 40 mg by mouth daily.     Cholecalciferol (VITAMIN D3) 50 MCG (2000 UT) capsule Take 4,000 Units by mouth daily.     lidocaine-prilocaine (EMLA) cream Apply to affected area once 30 g 3   methimazole (TAPAZOLE) 10 MG tablet Take 10 mg by mouth daily.     metoprolol succinate (TOPROL-XL) 50 MG 24 hr tablet Take 1 tablet (50 mg total) by mouth daily. Take with or immediately following a meal. 90 tablet 1   Multiple Vitamin (MULTI-VITAMINS) TABS Take 1 tablet by mouth daily.      ondansetron (ZOFRAN) 8 MG tablet Take 1 tablet (8 mg total) by mouth 2 (two) times daily as needed for refractory nausea / vomiting. 60 tablet 2   Phenylephrine HCl (NEO-SYNEPHRINE NA) Place 1 spray into the nose daily as needed (congestion).     potassium chloride SA (KLOR-CON) 20 MEQ tablet Take 1 tablet (20 mEq total) by mouth 2 (two) times daily. 60 tablet 2   Probiotic Product (PROBIOTIC ADVANCED PO) Take 1-2 capsules by mouth daily. When taking antibiotics pt will take 2 capsules instead of 1, normally just takes 1 per day otherwise     prochlorperazine (COMPAZINE) 10 MG tablet Take 1 tablet (10 mg total) by mouth every 6 (six) hours as needed (Nausea or vomiting). 60 tablet 2   silver sulfADIAZINE (SILVADENE) 1 % cream Apply 1 application topically daily. 50 g 0   Sodium  Hyaluronate, oral, (HYALURONIC ACID PO) Take 1 tablet by mouth daily.     No current facility-administered medications for this visit.   Facility-Administered Medications Ordered in Other Visits  Medication Dose Route Frequency Provider Last Rate Last Admin   sodium chloride flush (NS) 0.9 % injection 10 mL  10 mL Intravenous PRN Lloyd Huger, MD   10 mL at 03/07/20 0836   sodium chloride flush (NS) 0.9 % injection 10 mL  10 mL Intravenous PRN Lloyd Huger, MD   10 mL at 06/21/20 1259    OBJECTIVE: There were no vitals filed for this visit.    There is no height or weight on file to calculate BMI.    ECOG FS:0 - Asymptomatic  General: Well-developed, well-nourished, no acute distress. Eyes: Pink conjunctiva, anicteric sclera. HEENT: Normocephalic, moist  mucous membranes. Lungs: No audible wheezing or coughing. Heart: Regular rate and rhythm. Abdomen: Soft, nontender, no obvious distention. Musculoskeletal: No edema, cyanosis, or clubbing. Neuro: Alert, answering all questions appropriately. Cranial nerves grossly intact. Skin: No rashes or petechiae noted. Psych: Normal affect.   LAB RESULTS:  Lab Results  Component Value Date   NA 142 09/20/2020   K 4.0 09/20/2020   CL 104 09/20/2020   CO2 28 09/20/2020   GLUCOSE 103 (H) 09/20/2020   BUN 13 09/20/2020   CREATININE 0.50 01/20/2021   CALCIUM 9.3 09/20/2020   PROT 6.8 09/20/2020   ALBUMIN 3.7 09/20/2020   AST 26 09/20/2020   ALT 14 09/20/2020   ALKPHOS 121 09/20/2020   BILITOT 0.9 09/20/2020   GFRNONAA >60 09/20/2020   GFRAA >60 03/14/2020    Lab Results  Component Value Date   WBC 5.0 09/20/2020   NEUTROABS 3.0 09/20/2020   HGB 11.1 (L) 09/20/2020   HCT 33.8 (L) 09/20/2020   MCV 96.0 09/20/2020   PLT 134 (L) 09/20/2020     STUDIES: CT Abdomen Pelvis W Contrast  Result Date: 01/22/2021 CLINICAL DATA:  Colorectal cancer, surveillance, status post right hemicolectomy and chemotherapy EXAM: CT  ABDOMEN AND PELVIS WITH CONTRAST TECHNIQUE: Multidetector CT imaging of the abdomen and pelvis was performed using the standard protocol following bolus administration of intravenous contrast. CONTRAST:  53m OMNIPAQUE IOHEXOL 350 MG/ML SOLN additional oral enteric contrast COMPARISON:  None. FINDINGS: Lower chest: No acute abnormality. Cardiomegaly. Partially imaged aortic valve stent endograft. Stable, benign small nodules in the left left lung base measuring 0.3 cm (series 3, image 7, 13). Hepatobiliary: No focal liver abnormality is seen. Status post cholecystectomy. No biliary dilatation. Pancreas: Unremarkable. No pancreatic ductal dilatation or surrounding inflammatory changes. Spleen: Normal in size without significant abnormality. Adrenals/Urinary Tract: Adrenal glands are unremarkable. Kidneys are normal, without renal calculi, solid lesion, or hydronephrosis. Bladder is unremarkable. Stomach/Bowel: Stomach is within normal limits. Status post partial right hemicolectomy and reanastomosis. No evidence of bowel wall thickening, distention, or inflammatory changes. Pancolonic diverticulosis. Soft tissue thickening at the anus (series 2, image 88). Vascular/Lymphatic: Aortic atherosclerosis. No enlarged abdominal or pelvic lymph nodes. Reproductive: No mass or other significant abnormality. Other: No abdominal wall hernia or abnormality. No abdominopelvic ascites. Musculoskeletal: No acute or significant osseous findings. IMPRESSION: 1. Status post partial right hemicolectomy and reanastomosis. No evidence of mass, lymphadenopathy, or metastatic disease in the abdomen or pelvis. 2. Stable 3 mm pulmonary nodules in the left lung base, benign, incidental sequelae of prior infection or inflammation. 3. Soft tissue thickening at the anus, nonspecific, possibly reflecting hemorrhoids. Correlate with physical exam. 4. Cardiomegaly. Partially imaged aortic valve stent endograft. Aortic Atherosclerosis (ICD10-I70.0).  Electronically Signed   By: AEddie CandleM.D.   On: 01/22/2021 15:55    ASSESSMENT: Stage IIIb right colon cancer  PLAN:    1. Stage IIIb right colon cancer: Pathology results reviewed, confirming stage of disease.  CT scan results from Oct 28, 2019 reviewed independently  with no obvious evidence of metastatic disease.  Port has been removed.  She only received 11 treatments of FOLFOX secondary to declining performance status completing on April 25, 2020.  Repeat CT scan in August 2022.  Follow-up 1-2 days later for further evaluation and discussion of her results. 2.  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  She completed 5 years of anastrozole in June 2016.  Her  most recent mammogram 1 February 03, 2020 was reported as BI-RADS 1.  Repeat in August 2022 along with CT scan as above. 3.  Anemia: Slowly improving.  Patient's hemoglobin is 11.1 today. 4.  Thrombocytopenia: Slowly improving.  Patient platelet count is 134 today.  5.  Heart disease: Continue follow[facility]up at [facility] as scheduled.   Patient expressed understanding and was in agreement with this plan. She also understands that She can call clinic at any time with any questions, concerns, or complaints.   Cancer Staging Colon cancer [facility]) Staging form: Colon and Rectum, AJCC 8th Edition - Clinical stage from 11/13/2019: Stage IIIB (cT3, cN1a, cM0) - Signed by Lloyd Huger, MD on 11/13/2019 Total positive nodes: 1   Lloyd Huger, MD   02/03/2021 5:33 PM

## 2021-02-08 ENCOUNTER — Other Ambulatory Visit: Payer: Medicare Other

## 2021-02-08 ENCOUNTER — Ambulatory Visit: Payer: Medicare Other | Admitting: Oncology

## 2021-02-09 ENCOUNTER — Inpatient Hospital Stay: Payer: Medicare Other | Admitting: Oncology

## 2021-02-09 ENCOUNTER — Inpatient Hospital Stay: Payer: Medicare Other

## 2021-02-09 DIAGNOSIS — C182 Malignant neoplasm of ascending colon: Secondary | ICD-10-CM

## 2021-02-09 NOTE — Progress Notes (Signed)
[place]  Telephone:(336) [phone] Fax:(336) [phone]  ID: Maria Davies OB: 10-05-1940  MR#: [id number]  TIR#:[id number]  Patient Care Team: Valerie Roys, DO as PCP - General (Family Medicine) Guadalupe Maple, MD as PCP - Family Medicine (Family Medicine) Ronny Bacon, MD as Consulting Physician (General Surgery) Lloyd Huger, MD as Consulting Physician (Oncology)   CHIEF COMPLAINT: Stage IIIb right colon cancer.  INTERVAL HISTORY: Patient returns to clinic today for further evaluation and discussion of her imaging and laboratory results.  She continues to feel well and remains asymptomatic.   She denies any weakness or fatigue.  She has a good appetite and her weight is remained stable.  She has no neurologic complaints.  She denies any recent fevers or illnesses.  She has no chest pain, shortness of breath, cough, or hemoptysis. She denies any vomiting, constipation, or diarrhea.  She has no melena or hematochezia.  She has no urinary complaints.  Patient feels at her baseline offers no specific complaints today.  REVIEW OF SYSTEMS:   Review of Systems  Constitutional: Negative.  Negative for fever, malaise/fatigue and weight loss.  Respiratory: Negative.  Negative for cough, hemoptysis and shortness of breath.   Cardiovascular: Negative.  Negative for chest pain and leg swelling.  Gastrointestinal: Negative.  Negative for abdominal pain, blood in stool, melena and nausea.  Genitourinary: Negative.  Negative for hematuria.  Musculoskeletal: Negative.  Negative for back pain.  Skin: Negative.  Negative for rash.  Neurological: Negative.  Negative for dizziness, focal weakness, weakness and headaches.  Psychiatric/Behavioral: Negative.  The patient is not nervous/anxious.    As per HPI. Otherwise, a complete review of systems is negative.   PAST MEDICAL HISTORY: Past Medical History:  Diagnosis Date   Anemia    Arthritis    Basal cell  carcinoma    back   Breast cancer ([place]) 2010   RT LUMPECTOMY   Cancer ([place])    rt breast   Chemotherapy induced nausea and vomiting    Colon cancer ([place]) 2021   colon cancer   Heart murmur    Hypertension    Hypothyroidism    currently not on meds   Osteopenia    Personal history of chemotherapy 2010   right breast ca   Personal history of radiation therapy 2010   right breast ca   Port-A-Cath in place     PAST SURGICAL HISTORY: Past Surgical History:  Procedure Laterality Date   aortic valve stenosis  08/11/2020   BACK SURGERY     BREAST EXCISIONAL BIOPSY Right 03/2009   radiation and chemo +   BREAST EXCISIONAL BIOPSY Bilateral    NEG   BREAST SURGERY     CATARACT EXTRACTION W/ INTRAOCULAR LENS  IMPLANT, BILATERAL     CHOLECYSTECTOMY     COLONOSCOPY WITH PROPOFOL N/A 10/19/2019   Procedure: COLONOSCOPY WITH PROPOFOL-attempted, unable to perform poor prep;  Surgeon: Lucilla Lame, MD;  Location: [place];  Service: Endoscopy;  Laterality: N/A;  priority 3   COLONOSCOPY WITH PROPOFOL N/A 10/20/2019   Procedure: COLONOSCOPY WITH PROPOFOL;  Surgeon: Lucilla Lame, MD;  Location: [facility] END[facility]COPY;  Service: Endoscopy;  Laterality: N/A;   ESOPHAGEAL DILATION  10/19/2019   Procedure: ESOPHAGEAL DILATION;  Surgeon: Lucilla Lame, MD;  Location: [place];  Service: Endoscopy;;   ESOPHAGOGASTRODUODENOSCOPY (EGD) WITH PROPOFOL N/A 10/19/2019   Procedure: ESOPHAGOGASTRODUODENOSCOPY (EGD) WITH PROPOFOL;  Surgeon: Lucilla Lame, MD;  Location: [place];  Service: Endoscopy;  Laterality: N/A;   PORTACATH PLACEMENT Right 11/23/2019   Procedure: INSERTION PORT-A-CATH;  Surgeon: Ronny Bacon, MD;  Location: [facility] ORS;  Service: General;  Laterality: Right;    FAMILY HISTORY: Family History  Problem Relation Age of Onset   Cancer Mother    Heart disease Father    Breast cancer Paternal Aunt 71    ADVANCED DIRECTIVES (Y/N):  N  HEALTH MAINTENANCE: Social  History   Tobacco Use   Smoking status: Never   Smokeless tobacco: Never  Vaping Use   Vaping Use: Never used  Substance Use Topics   Alcohol use: Yes    Alcohol/week: 2.0 standard drinks    Types: 2 Standard drinks or equivalent per week    Comment: only on weeks not having treatment   Drug use: No     Colonoscopy:  PAP:  Bone density:  Lipid panel:  No Known Allergies  Current Outpatient Medications  Medication Sig Dispense Refill   acetaminophen (TYLENOL) 325 MG tablet Take 325 mg by mouth 3 (three) times daily as needed for moderate pain or headache.     amLODipine (NORVASC) 5 MG tablet Take 1 tablet (5 mg total) by mouth daily. 90 tablet 1   aspirin EC 81 MG tablet Take 81 mg by mouth daily.     benazepril (LOTENSIN) 40 MG tablet Take 1 tablet (40 mg total) by mouth daily. 90 tablet 1   lidocaine-prilocaine (EMLA) cream Apply to affected area once 30 g 3   methimazole (TAPAZOLE) 10 MG tablet Take 10 mg by mouth daily.     metoprolol succinate (TOPROL-XL) 50 MG 24 hr tablet Take 1 tablet (50 mg total) by mouth daily. Take with or immediately following a meal. 90 tablet 1   Multiple Vitamin (MULTI-VITAMINS) TABS Take 1 tablet by mouth daily.      Phenylephrine HCl (NEO-SYNEPHRINE NA) Place 1 spray into the nose daily as needed (congestion).     Probiotic Product (PROBIOTIC ADVANCED PO) Take 1-2 capsules by mouth daily. When taking antibiotics pt will take 2 capsules instead of 1, normally just takes 1 per day otherwise     silver sulfADIAZINE (SILVADENE) 1 % cream Apply 1 application topically daily. 50 g 0   Cholecalciferol (VITAMIN D3) 50 MCG (2000 UT) capsule Take 4,000 Units by mouth daily. (Patient not taking: Reported on 02/15/2021)     ondansetron (ZOFRAN) 8 MG tablet Take 1 tablet (8 mg total) by mouth 2 (two) times daily as needed for refractory nausea / vomiting. (Patient not taking: No sig reported) 60 tablet 2   potassium chloride SA (KLOR-CON) 20 MEQ tablet Take 1  tablet (20 mEq total) by mouth 2 (two) times daily. (Patient not taking: Reported on 02/15/2021) 60 tablet 2   prochlorperazine (COMPAZINE) 10 MG tablet Take 1 tablet (10 mg total) by mouth every 6 (six) hours as needed (Nausea or vomiting). (Patient not taking: Reported on 02/15/2021) 60 tablet 2   Sodium Hyaluronate, oral, (HYALURONIC ACID PO) Take 1 tablet by mouth daily. (Patient not taking: Reported on 02/15/2021)     No current facility-administered medications for this visit.   Facility-Administered Medications Ordered in Other Visits  Medication Dose Route Frequency Provider Last Rate Last Admin   sodium chloride flush (NS) 0.9 % injection 10 mL  10 mL Intravenous PRN Lloyd Huger, MD   10 mL at 03/07/20 0836   sodium chloride flush (NS) 0.9 % injection 10 mL  10 mL Intravenous PRN Lloyd Huger, MD  10 mL at 06/21/20 1259    OBJECTIVE: Vitals:   02/15/21 1055  BP: 132/69  Pulse: 88  Resp: 16  Temp: 97.6 F (36.4 C)     Body mass index is 31.01 kg/m.    ECOG FS:0 - Asymptomatic  General: Well-developed, well-nourished, no acute distress. Eyes: Pink conjunctiva, anicteric sclera. HEENT: Normocephalic, moist mucous membranes. Lungs: No audible wheezing or coughing. Heart: Regular rate and rhythm. Abdomen: Soft, nontender, no obvious distention. Musculoskeletal: No edema, cyanosis, or clubbing. Neuro: Alert, answering all questions appropriately. Cranial nerves grossly intact. Skin: No rashes or petechiae noted. Psych: Normal affect.   LAB RESULTS:  Lab Results  Component Value Date   NA 137 02/13/2021   K 3.9 02/13/2021   CL 96 02/13/2021   CO2 24 02/13/2021   GLUCOSE 113 (H) 02/13/2021   BUN 7 (L) 02/13/2021   CREATININE 0.61 02/13/2021   CALCIUM 9.8 02/13/2021   PROT 7.5 02/13/2021   ALBUMIN 5.0 (H) 02/13/2021   AST 25 02/13/2021   ALT 18 02/13/2021   ALKPHOS 129 (H) 02/13/2021   BILITOT 1.2 02/13/2021   GFRNONAA >60 09/20/2020   GFRAA >60  03/14/2020    Lab Results  Component Value Date   WBC 7.8 02/13/2021   NEUTROABS 5.2 02/13/2021   HGB 15.3 02/13/2021   HCT 43.5 02/13/2021   MCV 99 (H) 02/13/2021   PLT 172 02/13/2021     STUDIES: CT Abdomen Pelvis W Contrast  Result Date: 01/22/2021 CLINICAL DATA:  Colorectal cancer, surveillance, status post right hemicolectomy and chemotherapy EXAM: CT ABDOMEN AND PELVIS WITH CONTRAST TECHNIQUE: Multidetector CT imaging of the abdomen and pelvis was performed using the standard protocol following bolus administration of intravenous contrast. CONTRAST:  59m OMNIPAQUE IOHEXOL 350 MG/ML SOLN additional oral enteric contrast COMPARISON:  None. FINDINGS: Lower chest: No acute abnormality. Cardiomegaly. Partially imaged aortic valve stent endograft. Stable, benign small nodules in the left left lung base measuring 0.3 cm (series 3, image 7, 13). Hepatobiliary: No focal liver abnormality is seen. Status post cholecystectomy. No biliary dilatation. Pancreas: Unremarkable. No pancreatic ductal dilatation or surrounding inflammatory changes. Spleen: Normal in size without significant abnormality. Adrenals/Urinary Tract: Adrenal glands are unremarkable. Kidneys are normal, without renal calculi, solid lesion, or hydronephrosis. Bladder is unremarkable. Stomach/Bowel: Stomach is within normal limits. Status post partial right hemicolectomy and reanastomosis. No evidence of bowel wall thickening, distention, or inflammatory changes. Pancolonic diverticulosis. Soft tissue thickening at the anus (series 2, image 88). Vascular/Lymphatic: Aortic atherosclerosis. No enlarged abdominal or pelvic lymph nodes. Reproductive: No mass or other significant abnormality. Other: No abdominal wall hernia or abnormality. No abdominopelvic ascites. Musculoskeletal: No acute or significant osseous findings. IMPRESSION: 1. Status post partial right hemicolectomy and reanastomosis. No evidence of mass, lymphadenopathy, or  metastatic disease in the abdomen or pelvis. 2. Stable 3 mm pulmonary nodules in the left lung base, benign, incidental sequelae of prior infection or inflammation. 3. Soft tissue thickening at the anus, nonspecific, possibly reflecting hemorrhoids. Correlate with physical exam. 4. Cardiomegaly. Partially imaged aortic valve stent endograft. Aortic Atherosclerosis (ICD10-I70.0). Electronically Signed   By: AEddie CandleM.D.   On: 01/22/2021 15:55   MM 3D SCREEN BREAST BILATERAL  Result Date: 02/06/2021 CLINICAL DATA:  Screening. EXAM: DIGITAL SCREENING BILATERAL MAMMOGRAM WITH TOMOSYNTHESIS AND CAD TECHNIQUE: Bilateral screening digital craniocaudal and mediolateral oblique mammograms were obtained. Bilateral screening digital breast tomosynthesis was performed. The images were evaluated with computer-aided detection. COMPARISON:  Previous exam(s). ACR Breast Density Category c: The  breast tissue is heterogeneously dense, which may obscure small masses. FINDINGS: There are no findings suspicious for malignancy. IMPRESSION: No mammographic evidence of malignancy. A result letter of this screening mammogram will be mailed directly to the patient. RECOMMENDATION: Screening mammogram in one year. (Code:SM-B-01Y) BI-RADS CATEGORY  1: Negative. Electronically Signed   By: Franki Cabot M.D.   On: 02/06/2021 09:25   ASSESSMENT: Stage IIIb right colon cancer  PLAN:    1. Stage IIIb right colon cancer: Pathology results reviewed, confirming stage of disease.  Port has been removed.  She only received 11 treatments of FOLFOX secondary to declining performance status completing on April 25, 2020.  CT scan results from January 22, 2021 reviewed independently and reported as above with no obvious evidence of recurrent or progressive disease.  Repeat imaging in August 2023.  CEA is pending at time of dictation.  Patient will require colonoscopy in the near future.  No further interventions are needed.  Return to clinic  in 3 months for laboratory work only and then in 6 months for laboratory work and further evaluation.   2.  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  She completed 5 years of anastrozole in June 2016.  Her most recent mammogram on February 06, 2021 was reported as BI-RADS 1.  Repeat in August 2023. 3.  Anemia: Resolved. 4.  Thrombocytopenia: Resolved. 5.  Heart disease: Continue follow[facility]up at [facility] as scheduled.   Patient expressed understanding and was in agreement with this plan. She also understands that She can call clinic at any time with any questions, concerns, or complaints.   Cancer Staging Colon cancer [facility]) Staging form: Colon and Rectum, AJCC 8th Edition - Clinical stage from 11/13/2019: Stage IIIB (cT3, cN1a, cM0) - Signed by Lloyd Huger, MD on 11/13/2019 Total positive nodes: 1   Lloyd Huger, MD   02/16/2021 6:19 AM

## 2021-02-13 ENCOUNTER — Ambulatory Visit (INDEPENDENT_AMBULATORY_CARE_PROVIDER_SITE_OTHER): Payer: Medicare Other | Admitting: Family Medicine

## 2021-02-13 ENCOUNTER — Other Ambulatory Visit: Payer: Self-pay

## 2021-02-13 ENCOUNTER — Encounter: Payer: Self-pay | Admitting: Family Medicine

## 2021-02-13 VITALS — BP 136/78 | HR 85 | Temp 98.4°F | Ht 62.99 in | Wt 175.2 lb

## 2021-02-13 DIAGNOSIS — R7303 Prediabetes: Secondary | ICD-10-CM

## 2021-02-13 DIAGNOSIS — D649 Anemia, unspecified: Secondary | ICD-10-CM

## 2021-02-13 DIAGNOSIS — D500 Iron deficiency anemia secondary to blood loss (chronic): Secondary | ICD-10-CM | POA: Diagnosis not present

## 2021-02-13 DIAGNOSIS — C182 Malignant neoplasm of ascending colon: Secondary | ICD-10-CM

## 2021-02-13 DIAGNOSIS — I700 Atherosclerosis of aorta: Secondary | ICD-10-CM | POA: Diagnosis not present

## 2021-02-13 DIAGNOSIS — C50911 Malignant neoplasm of unspecified site of right female breast: Secondary | ICD-10-CM | POA: Diagnosis not present

## 2021-02-13 DIAGNOSIS — I10 Essential (primary) hypertension: Secondary | ICD-10-CM | POA: Diagnosis not present

## 2021-02-13 DIAGNOSIS — E7800 Pure hypercholesterolemia, unspecified: Secondary | ICD-10-CM

## 2021-02-13 DIAGNOSIS — S51802A Unspecified open wound of left forearm, initial encounter: Secondary | ICD-10-CM | POA: Diagnosis not present

## 2021-02-13 DIAGNOSIS — E0590 Thyrotoxicosis, unspecified without thyrotoxic crisis or storm: Secondary | ICD-10-CM | POA: Diagnosis not present

## 2021-02-13 DIAGNOSIS — Z23 Encounter for immunization: Secondary | ICD-10-CM | POA: Diagnosis not present

## 2021-02-13 DIAGNOSIS — E039 Hypothyroidism, unspecified: Secondary | ICD-10-CM | POA: Diagnosis not present

## 2021-02-13 LAB — URINALYSIS, ROUTINE W REFLEX MICROSCOPIC
Bilirubin, UA: NEGATIVE
Glucose, UA: NEGATIVE
Ketones, UA: NEGATIVE
Nitrite, UA: NEGATIVE
Protein,UA: NEGATIVE
Specific Gravity, UA: 1.01 (ref 1.005–1.030)
Urobilinogen, Ur: 0.2 mg/dL (ref 0.2–1.0)
pH, UA: 7 (ref 5.0–7.5)

## 2021-02-13 LAB — MICROSCOPIC EXAMINATION: Bacteria, UA: NONE SEEN

## 2021-02-13 LAB — MICROALBUMIN, URINE WAIVED
Creatinine, Urine Waived: 50 mg/dL (ref 10–300)
Microalb, Ur Waived: 10 mg/L (ref 0–19)

## 2021-02-13 LAB — BAYER DCA HB A1C WAIVED: HB A1C (BAYER DCA - WAIVED): 4.7 % (ref <7.0)

## 2021-02-13 MED ORDER — AMLODIPINE BESYLATE 5 MG PO TABS: 5.0000 mg | ORAL_TABLET | Freq: Every day | ORAL | 1 refills | Status: DC

## 2021-02-13 MED ORDER — METOPROLOL SUCCINATE ER 50 MG PO TB24: 50.0000 mg | ORAL_TABLET | Freq: Every day | ORAL | 1 refills | Status: DC

## 2021-02-13 MED ORDER — BENAZEPRIL HCL 40 MG PO TABS: 40.0000 mg | ORAL_TABLET | Freq: Every day | ORAL | 1 refills | Status: DC

## 2021-02-13 NOTE — Assessment & Plan Note (Signed)
Doing well with A1c of 4.7- will resolve off her list.

## 2021-02-13 NOTE — Assessment & Plan Note (Signed)
Under good control on current regimen. Continue current regimen. Continue to monitor. Call with any concerns. Refills given. Labs drawn today.   

## 2021-02-13 NOTE — Assessment & Plan Note (Signed)
Continue to follow with Dr. Grayland Ormond. Call with any concerns.

## 2021-02-13 NOTE — Assessment & Plan Note (Signed)
Rechecking labs today. Await results. Treat as needed.  °

## 2021-02-13 NOTE — Assessment & Plan Note (Signed)
Rechecking labs today. Await results.  

## 2021-02-13 NOTE — Assessment & Plan Note (Signed)
Follows with Dr. Gabriel Carina. Rechecking labs today. Await results.

## 2021-02-13 NOTE — Progress Notes (Signed)
BP 136/78   Pulse 85   Temp 98.4 F (36.9 C) (Oral)   Ht 5' 2.99" (1.6 m)   Wt 175 lb 3.2 oz (79.5 kg)   SpO2 97%   BMI 31.04 kg/m    Subjective:    Patient ID: Maria Davies, female    DOB: Feb 26, 1941, 80 y.o.   MRN: [id number]  HPI: Maria Davies is a 80 y.o. female  Chief Complaint  Patient presents with   Hypertension   Hyperlipidemia   Prediabetes   Hypothyroidism   HYPERTENSION / HYPERLIPIDEMIA Satisfied with current treatment?  yes Duration of hypertension: chronic BP monitoring frequency: a few times a month BP medication side effects: no Past BP meds: metoprolol, benazepril. amlodipine Duration of hyperlipidemia: chronic Cholesterol medication side effects: not on anything Cholesterol supplements: none Past cholesterol medications: none Medication compliance: excellent compliance Aspirin: yes Recent stressors: no Recurrent headaches: no Visual changes: no Palpitations: no Dyspnea: no Chest pain: no Lower extremity edema: no Dizzy/lightheaded: no  Impaired Fasting Glucose HbA1C:  Lab Results  Component Value Date   HGBA1C 4.5 08/15/2020   Duration of elevated blood sugar: chronic Polydipsia: no Polyuria: no Weight change: yes Visual disturbance: no Glucose Monitoring: no  Diabetic Education: Not Completed Family history of diabetes: no  HYPOTHYROIDISM Thyroid control status:controlled Satisfied with current treatment? yes Medication side effects: yes Medication compliance: excellent compliance Etiology of hypothyroidism:  Recent dose adjustment:no Fatigue: no Cold intolerance: no Heat intolerance: no Weight gain: no Weight loss: no Constipation: no Diarrhea/loose stools: no Palpitations: no Lower extremity edema: no Anxiety/depressed mood: no   Relevant past medical, surgical, family and social history reviewed and updated as indicated. Interim medical history since our last visit reviewed. Allergies and medications reviewed  and updated.  Review of Systems  Constitutional: Negative.   Respiratory: Negative.    Cardiovascular: Negative.   Gastrointestinal: Negative.   Musculoskeletal: Negative.   Neurological: Negative.   Psychiatric/Behavioral: Negative.     Per HPI unless specifically indicated above     Objective:    BP 136/78   Pulse 85   Temp 98.4 F (36.9 C) (Oral)   Ht 5' 2.99" (1.6 m)   Wt 175 lb 3.2 oz (79.5 kg)   SpO2 97%   BMI 31.04 kg/m   Wt Readings from Last 3 Encounters:  02/13/21 175 lb 3.2 oz (79.5 kg)  09/20/20 152 lb (68.9 kg)  08/19/20 145 lb (65.8 kg)    Physical Exam Vitals and nursing note reviewed.  Constitutional:      General: She is not in acute distress.    Appearance: Normal appearance. She is not ill-appearing, toxic-appearing or diaphoretic.  HENT:     Head: Normocephalic and atraumatic.     Right Ear: External ear normal.     Left Ear: External ear normal.     Nose: Nose normal.     Mouth/Throat:     Mouth: Mucous membranes are moist.     Pharynx: Oropharynx is clear.  Eyes:     General: No scleral icterus.       Right eye: No discharge.        Left eye: No discharge.     Extraocular Movements: Extraocular movements intact.     Conjunctiva/sclera: Conjunctivae normal.     Pupils: Pupils are equal, round, and reactive to light.  Cardiovascular:     Rate and Rhythm: Normal rate and regular rhythm.     Pulses: Normal pulses.     Heart sounds:  Normal heart sounds. No murmur heard.   No friction rub. No gallop.  Pulmonary:     Effort: Pulmonary effort is normal. No respiratory distress.     Breath sounds: Normal breath sounds. No stridor. No wheezing, rhonchi or rales.  Chest:     Chest wall: No tenderness.  Musculoskeletal:        General: Normal range of motion.     Cervical back: Normal range of motion and neck supple.  Skin:    General: Skin is warm and dry.     Capillary Refill: Capillary refill takes less than 2 seconds.     Coloration:  Skin is not jaundiced or pale.     Findings: No bruising, erythema, lesion or rash.     Comments: Small scratch on L forearm. Healing well.   Neurological:     General: No focal deficit present.     Mental Status: She is alert and oriented to person, place, and time. Mental status is at baseline.  Psychiatric:        Mood and Affect: Mood normal.        Behavior: Behavior normal.        Thought Content: Thought content normal.        Judgment: Judgment normal.    Results for orders placed or performed during the hospital encounter of 01/20/21  I-STAT creatinine  Result Value Ref Range   Creatinine, Ser 0.50 0.44 - 1.00 mg/dL      Assessment & Plan:   Problem List Items Addressed This Visit       Cardiovascular and Mediastinum   Hypertension - Primary    Under good control on current regimen. Continue current regimen. Continue to monitor. Call with any concerns. Refills given. Labs drawn today.       Relevant Medications   metoprolol succinate (TOPROL-XL) 50 MG 24 hr tablet   benazepril (LOTENSIN) 40 MG tablet   amLODipine (NORVASC) 5 MG tablet   Other Relevant Orders   CBC with Differential/Platelet   Comprehensive metabolic panel   Microalbumin, Urine Waived   Aortic atherosclerosis (HCC)    Under good control on current regimen. Continue current regimen. Continue to monitor. Call with any concerns. Refills given. Labs drawn today.       Relevant Medications   metoprolol succinate (TOPROL-XL) 50 MG 24 hr tablet   benazepril (LOTENSIN) 40 MG tablet   amLODipine (NORVASC) 5 MG tablet   Other Relevant Orders   CBC with Differential/Platelet   Comprehensive metabolic panel     Digestive   Colon cancer [facility])    Continue to follow with Dr. Grayland Ormond. Call with any concerns.       Relevant Orders   CBC with Differential/Platelet   Comprehensive metabolic panel     Endocrine   Hyperthyroidism    Follows with Dr. Gabriel Carina. Rechecking labs today. Await results.        Relevant Medications   metoprolol succinate (TOPROL-XL) 50 MG 24 hr tablet   Other Relevant Orders   CBC with Differential/Platelet   Comprehensive metabolic panel   TSH   Acquired hypothyroidism    Continue to follow with Dr. Grayland Ormond. Call with any concerns.       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 Grayland Ormond. Call with any concerns.       Hypercholesteremia    Rechecking labs today. Await results. Treat as needed.       Relevant Medications   metoprolol succinate (TOPROL-XL) 50 MG 24 hr tablet   benazepril (LOTENSIN) 40 MG tablet   amLODipine (NORVASC) 5 MG tablet   Other Relevant Orders   CBC with Differential/Platelet   Comprehensive metabolic panel   Lipid Panel w/o Chol/HDL Ratio   Iron deficiency anemia secondary to blood loss (chronic)   Relevant Orders   CBC with Differential/Platelet   Comprehensive metabolic panel   Iron and TIBC   Ferritin   Postoperative anemia    Rechecking labs today. Await results.       RESOLVED: Pre-diabetes    Doing well with A1c of 4.7- will resolve off her list.       Relevant Orders   Bayer DCA Hb A1c Waived   CBC with Differential/Platelet   Comprehensive metabolic panel   Urinalysis, Routine w reflex microscopic   Other Visit Diagnoses     Open wound of left forearm, initial encounter       Due for Td. Given today.   Relevant Orders   Td : Tetanus/diphtheria >7yo Preservative  free (Completed)        Follow up plan: Return in about 6 months (around 08/15/2021).

## 2021-02-14 ENCOUNTER — Encounter: Payer: Self-pay | Admitting: Oncology

## 2021-02-14 DIAGNOSIS — Z853 Personal history of malignant neoplasm of breast: Secondary | ICD-10-CM | POA: Diagnosis not present

## 2021-02-14 LAB — COMPREHENSIVE METABOLIC PANEL
ALT: 18 IU/L (ref 0–32)
AST: 25 IU/L (ref 0–40)
Albumin/Globulin Ratio: 2 (ref 1.2–2.2)
Albumin: 5 g/dL — ABNORMAL HIGH (ref 3.7–4.7)
Alkaline Phosphatase: 129 IU/L — ABNORMAL HIGH (ref 44–121)
BUN/Creatinine Ratio: 11 — ABNORMAL LOW (ref 12–28)
BUN: 7 mg/dL — ABNORMAL LOW (ref 8–27)
Bilirubin Total: 1.2 mg/dL (ref 0.0–1.2)
CO2: 24 mmol/L (ref 20–29)
Calcium: 9.8 mg/dL (ref 8.7–10.3)
Chloride: 96 mmol/L (ref 96–106)
Creatinine, Ser: 0.61 mg/dL (ref 0.57–1.00)
Globulin, Total: 2.5 g/dL (ref 1.5–4.5)
Glucose: 113 mg/dL — ABNORMAL HIGH (ref 65–99)
Potassium: 3.9 mmol/L (ref 3.5–5.2)
Sodium: 137 mmol/L (ref 134–144)
Total Protein: 7.5 g/dL (ref 6.0–8.5)
eGFR: 91 mL/min/{1.73_m2} (ref >59)

## 2021-02-14 LAB — FERRITIN: Ferritin: 111 ng/mL (ref 15–150)

## 2021-02-14 LAB — IRON AND TIBC
Iron Saturation: 48 % (ref 15–55)
Iron: 169 ug/dL — ABNORMAL HIGH (ref 27–139)
Total Iron Binding Capacity: 355 ug/dL (ref 250–450)
UIBC: 186 ug/dL (ref 118–369)

## 2021-02-14 LAB — LIPID PANEL W/O CHOL/HDL RATIO
Cholesterol, Total: 267 mg/dL — ABNORMAL HIGH (ref 100–199)
HDL: 133 mg/dL (ref >39)
LDL Chol Calc (NIH): 118 mg/dL — ABNORMAL HIGH (ref 0–99)
Triglycerides: 98 mg/dL (ref 0–149)
VLDL Cholesterol Cal: 16 mg/dL (ref 5–40)

## 2021-02-14 LAB — CBC WITH DIFFERENTIAL/PLATELET
Basophils Absolute: 0.1 10*3/uL (ref 0.0–0.2)
Basos: 1 %
EOS (ABSOLUTE): 0.2 10*3/uL (ref 0.0–0.4)
Eos: 3 %
Hematocrit: 43.5 % (ref 34.0–46.6)
Hemoglobin: 15.3 g/dL (ref 11.1–15.9)
Immature Grans (Abs): 0.1 10*3/uL (ref 0.0–0.1)
Immature Granulocytes: 1 %
Lymphocytes Absolute: 1.3 10*3/uL (ref 0.7–3.1)
Lymphs: 17 %
MCH: 34.7 pg — ABNORMAL HIGH (ref 26.6–33.0)
MCHC: 35.2 g/dL (ref 31.5–35.7)
MCV: 99 fL — ABNORMAL HIGH (ref 79–97)
Monocytes Absolute: 1 10*3/uL — ABNORMAL HIGH (ref 0.1–0.9)
Monocytes: 13 %
Neutrophils Absolute: 5.2 10*3/uL (ref 1.4–7.0)
Neutrophils: 65 %
Platelets: 172 10*3/uL (ref 150–450)
RBC: 4.41 x10E6/uL (ref 3.77–5.28)
RDW: 12.7 % (ref 11.7–15.4)
WBC: 7.8 10*3/uL (ref 3.4–10.8)

## 2021-02-14 LAB — TSH: TSH: 11.8 u[IU]/mL — ABNORMAL HIGH (ref 0.450–4.500)

## 2021-02-15 ENCOUNTER — Inpatient Hospital Stay (HOSPITAL_BASED_OUTPATIENT_CLINIC_OR_DEPARTMENT_OTHER): Payer: Medicare Other | Admitting: Oncology

## 2021-02-15 ENCOUNTER — Inpatient Hospital Stay: Payer: Medicare Other | Attending: Oncology

## 2021-02-15 ENCOUNTER — Encounter: Payer: Self-pay | Admitting: Oncology

## 2021-02-15 VITALS — BP 132/69 | HR 88 | Temp 97.6°F | Resp 16 | Wt 175.0 lb

## 2021-02-15 DIAGNOSIS — Z803 Family history of malignant neoplasm of breast: Secondary | ICD-10-CM | POA: Diagnosis not present

## 2021-02-15 DIAGNOSIS — I700 Atherosclerosis of aorta: Secondary | ICD-10-CM | POA: Insufficient documentation

## 2021-02-15 DIAGNOSIS — I119 Hypertensive heart disease without heart failure: Secondary | ICD-10-CM | POA: Insufficient documentation

## 2021-02-15 DIAGNOSIS — Z923 Personal history of irradiation: Secondary | ICD-10-CM | POA: Insufficient documentation

## 2021-02-15 DIAGNOSIS — I10 Essential (primary) hypertension: Secondary | ICD-10-CM | POA: Insufficient documentation

## 2021-02-15 DIAGNOSIS — C182 Malignant neoplasm of ascending colon: Secondary | ICD-10-CM | POA: Insufficient documentation

## 2021-02-15 DIAGNOSIS — Z853 Personal history of malignant neoplasm of breast: Secondary | ICD-10-CM | POA: Insufficient documentation

## 2021-02-15 DIAGNOSIS — M8580 Other specified disorders of bone density and structure, unspecified site: Secondary | ICD-10-CM | POA: Insufficient documentation

## 2021-02-15 DIAGNOSIS — Z9221 Personal history of antineoplastic chemotherapy: Secondary | ICD-10-CM | POA: Insufficient documentation

## 2021-02-15 DIAGNOSIS — R011 Cardiac murmur, unspecified: Secondary | ICD-10-CM | POA: Insufficient documentation

## 2021-02-15 DIAGNOSIS — Z7982 Long term (current) use of aspirin: Secondary | ICD-10-CM | POA: Diagnosis not present

## 2021-02-15 DIAGNOSIS — Z79899 Other long term (current) drug therapy: Secondary | ICD-10-CM | POA: Insufficient documentation

## 2021-02-15 DIAGNOSIS — E039 Hypothyroidism, unspecified: Secondary | ICD-10-CM | POA: Insufficient documentation

## 2021-02-15 DIAGNOSIS — I519 Heart disease, unspecified: Secondary | ICD-10-CM | POA: Diagnosis not present

## 2021-02-15 DIAGNOSIS — Z85828 Personal history of other malignant neoplasm of skin: Secondary | ICD-10-CM | POA: Insufficient documentation

## 2021-02-15 NOTE — Progress Notes (Signed)
Patient denies new problems/concerns today.   °

## 2021-02-16 ENCOUNTER — Encounter: Payer: Self-pay | Admitting: Oncology

## 2021-02-16 LAB — CEA: CEA: 2.3 ng/mL (ref 0.0–4.7)

## 2021-02-27 ENCOUNTER — Telehealth: Payer: Self-pay | Admitting: Family Medicine

## 2021-02-27 NOTE — Telephone Encounter (Signed)
Pt is calling regarding her 02/13/21 thyroid results that where to be forward to the pt endocrinologist. Pt states that Dr. Gabriel Carina has not received these results.  Pt wanted to leave the Fax for the endocrinologist office [phone] Dr. Gabriel Carina  Pts CB- [phone] ok to leave a VM

## 2021-02-28 NOTE — Telephone Encounter (Signed)
Lab results printed and faxed as requested.  Called and let patient know that this was done for her.

## 2021-03-24 ENCOUNTER — Emergency Department
Admission: EM | Admit: 2021-03-24 | Discharge: 2021-03-24 | Disposition: A | Discharge status: Home / Self Care | Payer: Medicare Other | Attending: Emergency Medicine | Admitting: Emergency Medicine

## 2021-03-24 ENCOUNTER — Other Ambulatory Visit: Payer: Self-pay

## 2021-03-24 ENCOUNTER — Emergency Department: Payer: Medicare Other

## 2021-03-24 DIAGNOSIS — Z85038 Personal history of other malignant neoplasm of large intestine: Secondary | ICD-10-CM | POA: Diagnosis not present

## 2021-03-24 DIAGNOSIS — Z9011 Acquired absence of right breast and nipple: Secondary | ICD-10-CM | POA: Diagnosis not present

## 2021-03-24 DIAGNOSIS — Z7982 Long term (current) use of aspirin: Secondary | ICD-10-CM | POA: Insufficient documentation

## 2021-03-24 DIAGNOSIS — I10 Essential (primary) hypertension: Secondary | ICD-10-CM | POA: Diagnosis not present

## 2021-03-24 DIAGNOSIS — E039 Hypothyroidism, unspecified: Secondary | ICD-10-CM | POA: Diagnosis not present

## 2021-03-24 DIAGNOSIS — R531 Weakness: Secondary | ICD-10-CM | POA: Insufficient documentation

## 2021-03-24 DIAGNOSIS — Z853 Personal history of malignant neoplasm of breast: Secondary | ICD-10-CM | POA: Diagnosis not present

## 2021-03-24 DIAGNOSIS — M545 Low back pain, unspecified: Secondary | ICD-10-CM | POA: Insufficient documentation

## 2021-03-24 DIAGNOSIS — Z79899 Other long term (current) drug therapy: Secondary | ICD-10-CM | POA: Insufficient documentation

## 2021-03-24 DIAGNOSIS — M5410 Radiculopathy, site unspecified: Secondary | ICD-10-CM | POA: Diagnosis not present

## 2021-03-24 LAB — CBC
HCT: 41.1 % (ref 36.0–46.0)
Hemoglobin: 15.2 g/dL — ABNORMAL HIGH (ref 12.0–15.0)
MCH: 36.2 pg — ABNORMAL HIGH (ref 26.0–34.0)
MCHC: 37 g/dL — ABNORMAL HIGH (ref 30.0–36.0)
MCV: 97.9 fL (ref 80.0–100.0)
Platelets: 163 10*3/uL (ref 150–400)
RBC: 4.2 MIL/uL (ref 3.87–5.11)
RDW: 12.5 % (ref 11.5–15.5)
WBC: 7.3 10*3/uL (ref 4.0–10.5)
nRBC: 0 % (ref 0.0–0.2)

## 2021-03-24 LAB — BASIC METABOLIC PANEL
Anion gap: 12 (ref 5–15)
BUN: 8 mg/dL (ref 8–23)
CO2: 26 mmol/L (ref 22–32)
Calcium: 9.7 mg/dL (ref 8.9–10.3)
Chloride: 96 mmol/L — ABNORMAL LOW (ref 98–111)
Creatinine, Ser: 0.46 mg/dL (ref 0.44–1.00)
GFR, Estimated: >60 mL/min (ref >60)
Glucose, Bld: 126 mg/dL — ABNORMAL HIGH (ref 70–99)
Potassium: 3.4 mmol/L — ABNORMAL LOW (ref 3.5–5.1)
Sodium: 134 mmol/L — ABNORMAL LOW (ref 135–145)

## 2021-03-24 IMAGING — CT CT HEAD W/O CM
3 series · 16 of 47 positions shown, 19 images · non-contrast
Comparison: None.

CLINICAL DATA: Right lower extremity weakness.

EXAM:
CT HEAD WITHOUT CONTRAST
TECHNIQUE: Contiguous axial images were obtained from the base of the skull
through the vertex without intravenous contrast.

[Series 3: head wo · axial · 0.41mm/px · z∈[-158,-28]mm · 10 of 32 slices shown, 13 images]
[im 3/32  brain]
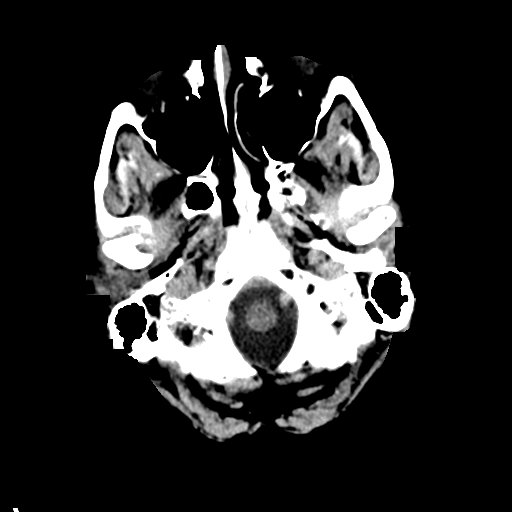
[im 3/32  bone]
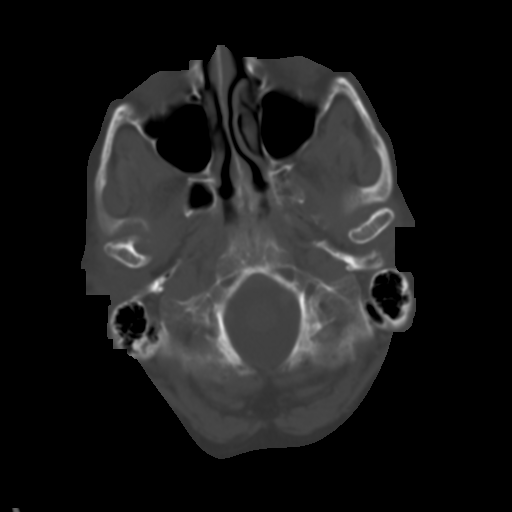
[im 6/32  brain]
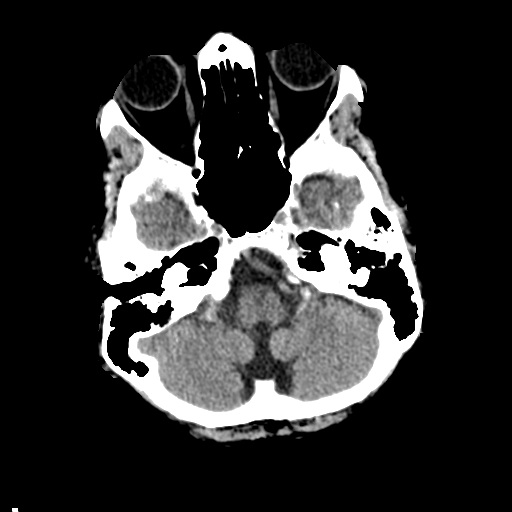
[im 9/32  brain]
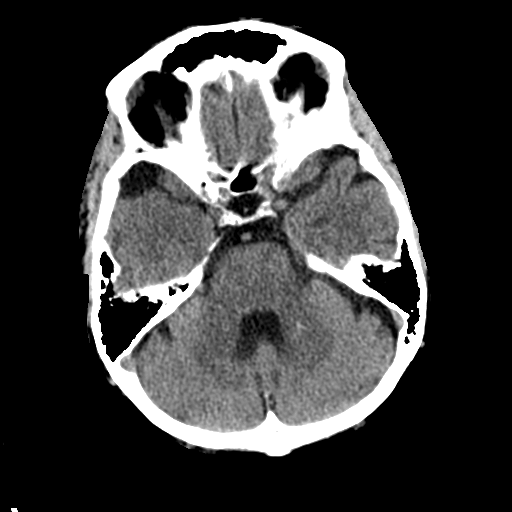
[im 11/32  brain]
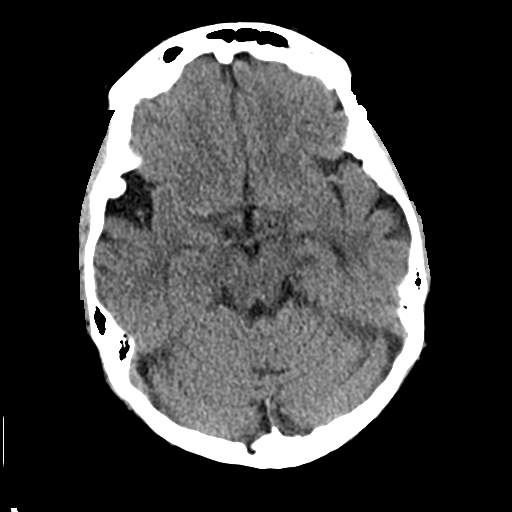
[im 14/32  brain]
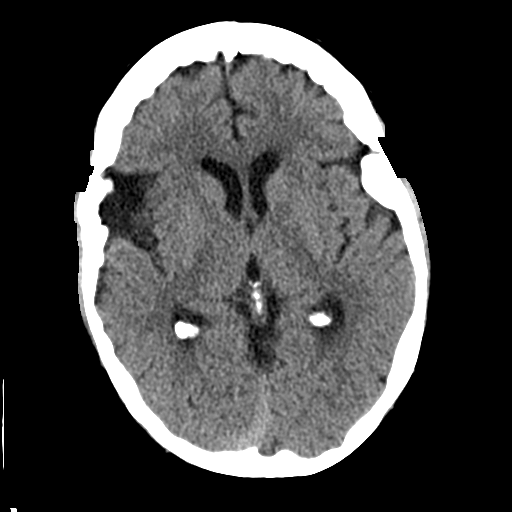
[im 14/32  bone]
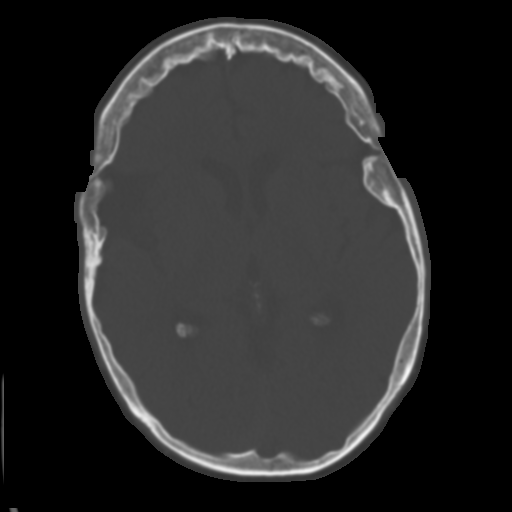
[im 18/32  brain]
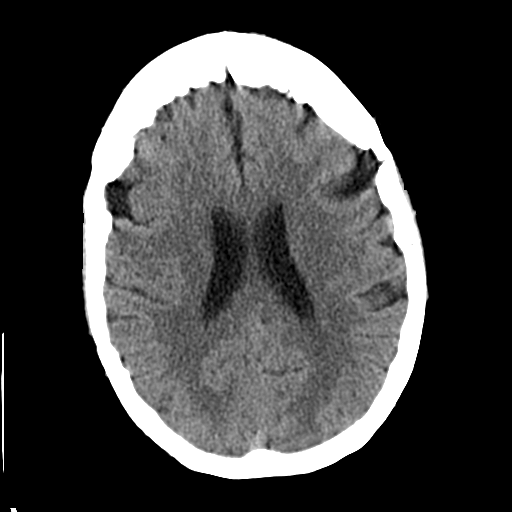
[im 21/32  brain]
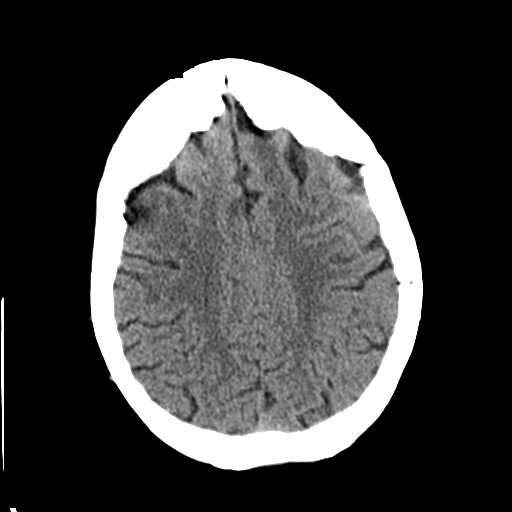
[im 24/32  brain]
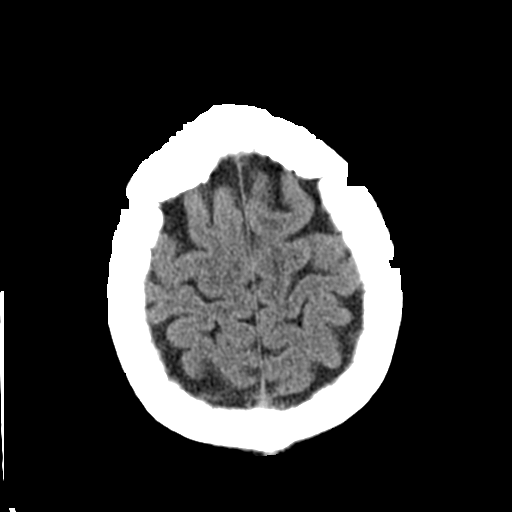
[im 26/32  brain]
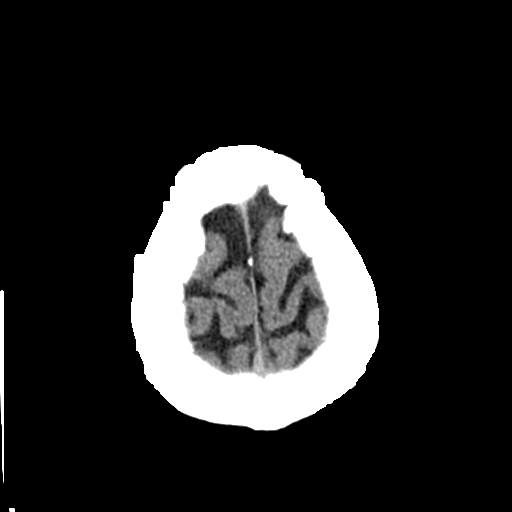
[im 26/32  bone]
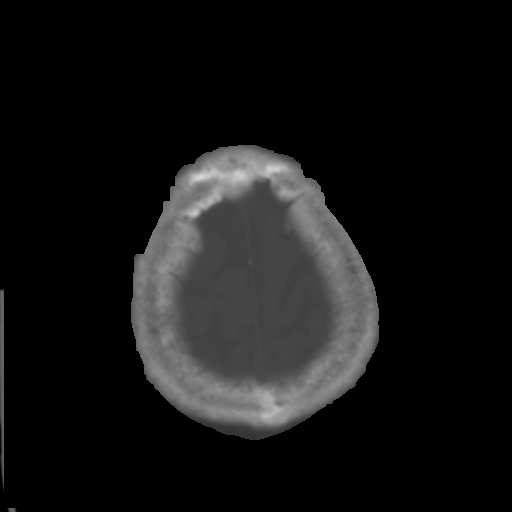
[im 29/32  brain]
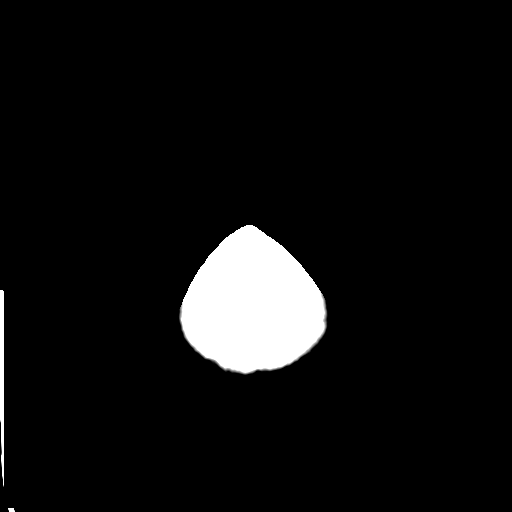

[Series 4: coronal soft tissue · coronal · 0.31mm/px · 3 of 70 slices shown]
[im 24/70  brain]
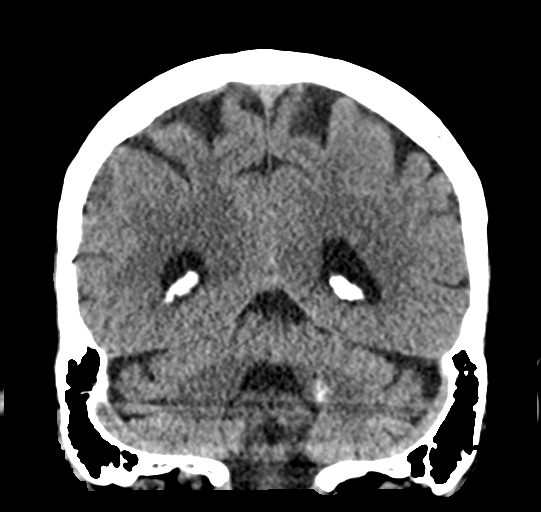
[im 31/70  brain]
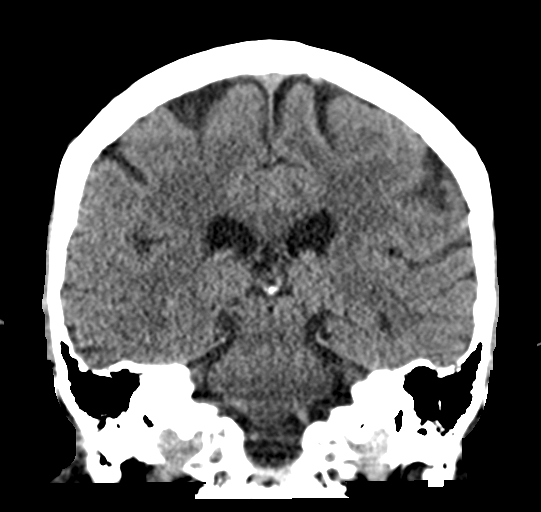
[im 39/70  brain]
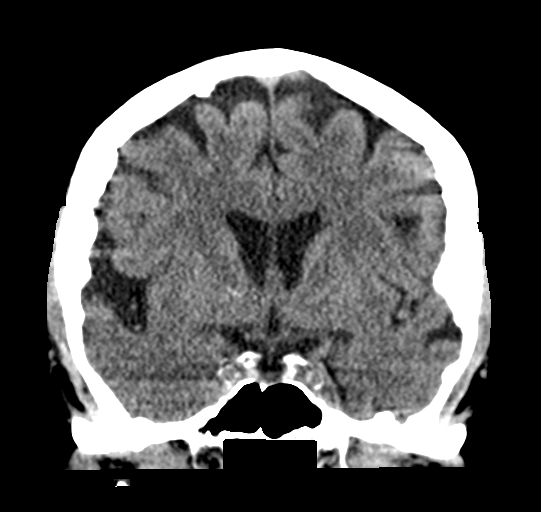

[Series 5: sagittal soft tissue · sagittal · 0.31mm/px · 3 of 57 slices shown]
[im 19/57  brain]
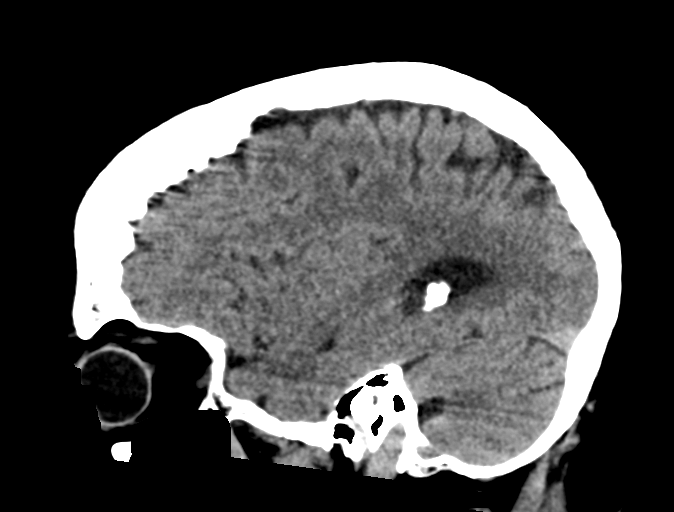
[im 29/57  brain]
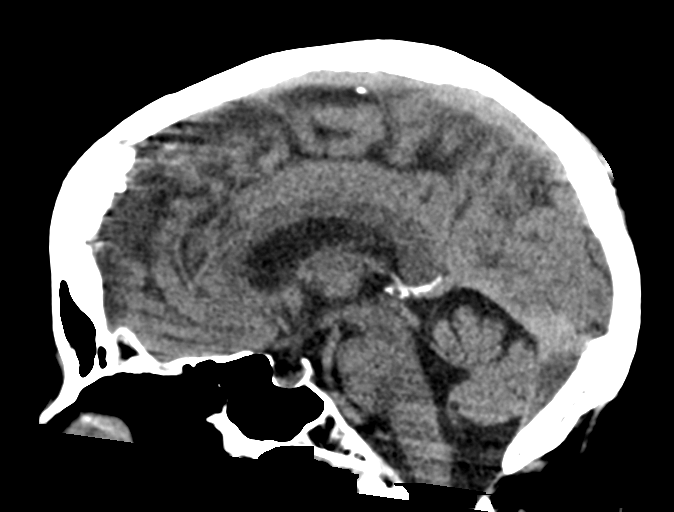
[im 38/57  brain]
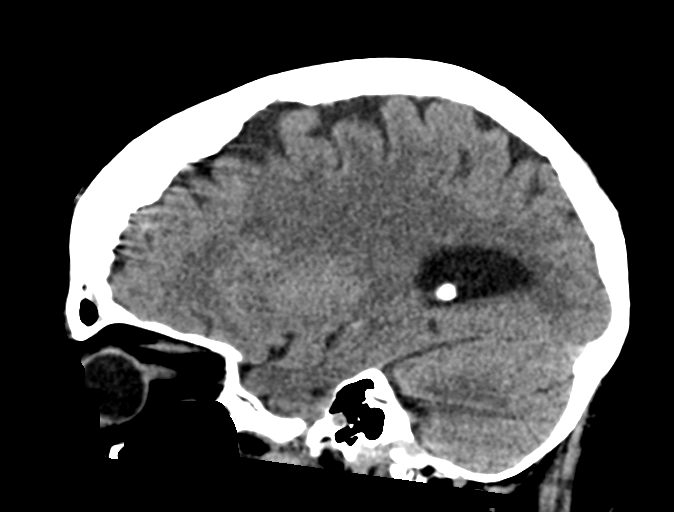

[16 of 47 positions shown; findings below may reference images not displayed]

FINDINGS: Brain: No evidence of acute infarction, hemorrhage, hydrocephalus,
extra-axial collection or mass lesion/mass effect.

Vascular: No hyperdense vessel or unexpected calcification.

Skull: Normal. Negative for fracture or focal lesion.

Sinuses/Orbits: No acute finding.

Other: None.
IMPRESSION: No acute intracranial abnormality seen.

## 2021-03-24 MED ORDER — PREDNISONE 10 MG PO TABS: 10.0000 mg | ORAL_TABLET | Freq: Every day | ORAL | 0 refills | Status: DC

## 2021-03-24 NOTE — Discharge Instructions (Addendum)
Please take your prescribed course of steroids.  Return to the emergency department for any increased weakness numbness of the right leg if you develop any difficulty speaking or thinking, or for any bladder or bowel incontinence.  Otherwise please follow-up with your doctor on Monday for recheck/reevaluation.

## 2021-03-24 NOTE — ED Triage Notes (Signed)
Pt comes pov with being unable to walk for 3-4 days. 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 Some minor weakness in right leg compared to left but otherwise neurologically intact. Was doing a lot of shopping Monday and started to notice it.

## 2021-03-24 NOTE — ED Provider Notes (Signed)
[facility] Emergency Department Provider Note  Time seen: 1:15 PM  I have reviewed the triage vital signs and the nursing notes.   HISTORY  Chief Complaint Right leg weakness   HPI Maria Davies is a 80 y.o. female with a past medical history of anemia, neuropathy, hypertension, breast cancer s/p radiation and chemotherapy who presents to the emergency department for right lower extremity weakness.  According to the patient for the past week or so she has had worsening weakness in the right lower extremity.  She states approximately 1 to 2 weeks ago she was also having some lower back pain and saw a masseuse/therapist.  Patient states a history of neuropathy but usually only in her toes or feet this weakness has been up her right leg causing her to almost fall this morning so she came to the emergency department for evaluation.  Patient denies any weakness or numbness of any upper extremity any confusion or slurred speech.  No history of CVA.   Past Medical History:  Diagnosis Date   Anemia    Arthritis    Basal cell carcinoma    back   Breast cancer ([place]) 2010   RT LUMPECTOMY   Cancer ([place])    rt breast   Chemotherapy induced nausea and vomiting    Colon cancer ([place]) 2021   colon cancer   Heart murmur    Hypertension    Hypothyroidism    currently not on meds   Osteopenia    Personal history of chemotherapy 2010   right breast ca   Personal history of radiation therapy 2010   right breast ca   Port-A-Cath in place     Patient Active Problem List   Diagnosis Date Noted   Aortic atherosclerosis ([place]) 09/07/2020   Postoperative anemia 09/01/2020   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   Post-operative pain 08/31/2020   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   Goals of care, counseling/discussion 11/19/2019   Status post laparoscopic colectomy 11/17/2019   Colon cancer ([place]) 10/28/2019   Iron deficiency anemia secondary to  blood loss (chronic)    Polyp of sigmoid colon    Benign neoplasm of cecum    Intestines neoplasm    Stricture and stenosis of esophagus    Advanced care planning/counseling discussion 07/22/2017   Acquired hypothyroidism 07/30/2016   Hypercholesteremia 07/03/2016   Osteoporosis 07/03/2016   Carcinoma of right breast ([place]) 06/19/2016   Aortic stenosis, mild 05/02/2015   Hypertension 05/02/2015   Hyperthyroidism 05/02/2015   Breast cancer in female [facility]) 05/02/2015    Past Surgical History:  Procedure Laterality Date   aortic valve stenosis  08/11/2020   BACK SURGERY     BREAST EXCISIONAL BIOPSY Right 03/2009   radiation and chemo +   BREAST EXCISIONAL BIOPSY Bilateral    NEG   BREAST SURGERY     CATARACT EXTRACTION W/ INTRAOCULAR LENS  IMPLANT, BILATERAL     CHOLECYSTECTOMY     COLONOSCOPY WITH PROPOFOL N/A 10/19/2019   Procedure: COLONOSCOPY WITH PROPOFOL-attempted, unable to perform poor prep;  Surgeon: Lucilla Lame, MD;  Location: [place];  Service: Endoscopy;  Laterality: N/A;  priority 3   COLONOSCOPY WITH PROPOFOL N/A 10/20/2019   Procedure: COLONOSCOPY WITH PROPOFOL;  Surgeon: Lucilla Lame, MD;  Location: [facility] ENDOSCOPY;  Service: Endoscopy;  Laterality: N/A;   ESOPHAGEAL DILATION  10/19/2019   Procedure: ESOPHAGEAL DILATION;  Surgeon: Lucilla Lame, MD;  Location: [place];  Service: Endoscopy;;  ESOPHAGOGASTRODUODENOSCOPY (EGD) WITH PROPOFOL N/A 10/19/2019   Procedure: ESOPHAGOGASTRODUODENOSCOPY (EGD) WITH PROPOFOL;  Surgeon: Lucilla Lame, MD;  Location: [place];  Service: Endoscopy;  Laterality: N/A;   PORTACATH PLACEMENT Right 11/23/2019   Procedure: INSERTION PORT-A-CATH;  Surgeon: Ronny Bacon, MD;  Location: [facility];  Service: General;  Laterality: Right;    Prior to Admission medications   Medication Sig Start Date End Date Taking? Authorizing Provider  acetaminophen (TYLENOL) 325 MG tablet Take 325 mg by mouth 3 (three) times  daily as needed for moderate pain or headache.    [provider]  amLODipine (NORVASC) 5 MG tablet Take 1 tablet (5 mg total) by mouth daily. 02/13/21   Park Liter P, DO  aspirin EC 81 MG tablet Take 81 mg by mouth daily.    [provider]  benazepril (LOTENSIN) 40 MG tablet Take 1 tablet (40 mg total) by mouth daily. 02/13/21   Park Liter P, DO  Cholecalciferol (VITAMIN D3) 50 MCG (2000 UT) capsule Take 4,000 Units by mouth daily. Patient not taking: Reported on 02/15/2021    [provider]  lidocaine-prilocaine (EMLA) cream Apply to affected area once 11/19/19   Lloyd Huger, MD  methimazole (TAPAZOLE) 10 MG tablet Take 10 mg by mouth daily. 10/12/20   [provider]  metoprolol succinate (TOPROL-XL) 50 MG 24 hr tablet Take 1 tablet (50 mg total) by mouth daily. Take with or immediately following a meal. 02/13/21   Johnson, Megan P, DO  Multiple Vitamin (MULTI-VITAMINS) TABS Take 1 tablet by mouth daily.     [provider]  ondansetron (ZOFRAN) 8 MG tablet Take 1 tablet (8 mg total) by mouth 2 (two) times daily as needed for refractory nausea / vomiting. Patient not taking: No sig reported 11/19/19   Lloyd Huger, MD  Phenylephrine HCl (NEO-SYNEPHRINE NA) Place 1 spray into the nose daily as needed (congestion).    [provider]  potassium chloride SA (KLOR-CON) 20 MEQ tablet Take 1 tablet (20 mEq total) by mouth 2 (two) times daily. Patient not taking: Reported on 02/15/2021 05/11/20   Lloyd Huger, MD  Probiotic Product (PROBIOTIC ADVANCED PO) Take 1-2 capsules by mouth daily. When taking antibiotics pt will take 2 capsules instead of 1, normally just takes 1 per day otherwise    [provider]  prochlorperazine (COMPAZINE) 10 MG tablet Take 1 tablet (10 mg total) by mouth every 6 (six) hours as needed (Nausea or vomiting). Patient not taking: Reported on 02/15/2021 11/19/19   Lloyd Huger, MD  silver  sulfADIAZINE (SILVADENE) 1 % cream Apply 1 application topically daily. 07/22/17   Guadalupe Maple, MD  Sodium Hyaluronate, oral, (HYALURONIC ACID PO) Take 1 tablet by mouth daily. Patient not taking: Reported on 02/15/2021    [provider]    No Known Allergies  Family History  Problem Relation Age of Onset   Cancer Mother    Heart disease Father    Breast cancer Paternal Aunt 39    Social History Social History   Tobacco Use   Smoking status: Never   Smokeless tobacco: Never  Vaping Use   Vaping Use: Never used  Substance Use Topics   Alcohol use: Yes    Alcohol/week: 2.0 standard drinks    Types: 2 Standard drinks or equivalent per week    Comment: only on weeks not having treatment   Drug use: No    Review of Systems Constitutional: Negative for fever. Cardiovascular:  Negative for chest pain. Respiratory: Negative for shortness of breath. Gastrointestinal: Negative for abdominal pain Musculoskeletal: Right lower extremity weakness with occasional pain in the right lower extremity.  No significant lower back pain however she states 1 to 2 weeks ago she was having lower back pain. Skin: Negative for skin complaints  Neurological: Negative for headache All other ROS negative  ____________________________________________   PHYSICAL EXAM:  VITAL SIGNS: ED Triage Vitals  Enc Vitals Group     BP 03/24/21 1218 138/75     Pulse Rate 03/24/21 1218 95     Resp 03/24/21 1218 18     Temp 03/24/21 1216 98.2 F (36.8 C)     Temp Source 03/24/21 1216 Oral     SpO2 03/24/21 1218 94 %     Weight 03/24/21 1216 160 lb (72.6 kg)     Height 03/24/21 1216 5\' 3"  (1.6 m)     Head Circumference --      Peak Flow --      Pain Score 03/24/21 1221 0     Pain Loc --      Pain Edu? --      Excl. in [place]? --     Constitutional: Alert and oriented. Well appearing and in no distress. Eyes: Normal exam ENT      Head: Normocephalic and atraumatic.      Mouth/Throat: Mucous  membranes are moist. Cardiovascular: Normal rate, regular rhythm.  Respiratory: Normal respiratory effort without tachypnea nor retractions. Breath sounds are clear Gastrointestinal: Soft and nontender. No distention.   Musculoskeletal: Nontender with normal range of motion in all extremities. Neurologic:  Normal speech and language.  4/5 strength in right lower extremity otherwise 5/5 in all other extremities.  No pronator drift.  Slight right lower extremity drift but does not touch the bed.  Cranial nerves intact. Skin:  Skin is warm, dry and intact.  Psychiatric: Mood and affect are normal.   ____________________________________________    EKG  EKG viewed and interpreted by myself appears to show a sinus rhythm at 96 bpm with a widened QRS, left axis deviation, slight QTC prolongation otherwise normal intervals with nonspecific ST changes without elevation.  ____________________________________________    RADIOLOGY  CT head is negative for acute abnormality.  ____________________________________________   INITIAL IMPRESSION / ASSESSMENT AND PLAN / ED COURSE  Pertinent labs & imaging results that were available during my care of the patient were reviewed by me and considered in my medical decision making (see chart for details).   Patient presents to the emergency department for 1 week of right lower extremity weakness that appears to be getting worse per patient.  Patient denies any numbness incontinence.  Patient states 2 weeks ago or so she was having lower back pain and did see a massage therapist.  Denies any upper extremity deficits.  Neurologic exam shows mild to moderate right lower extremity weakness otherwise normal exam.  Patient's lab work is largely nonrevealing.  Given the patient's symptoms we will proceed with an MRI of the brain as well as the lumbar spine to evaluate.  Unable to obtain an MRI due to defibrillator.  We will obtain a CT scan head as a precaution.   If the CT is normal patient would prefer to go home and follow-up with her doctor.  If CT is negative we will discharge with a prednisone taper possible sciatica.  Patient agreeable to plan of care.  CT scan head is negative for acute abnormality.  Patient strongly  believes is likely her back causing the discomfort/weakness.  As patient cannot tolerate an MRI we will place the patient on a prednisone taper and have her follow-up with her doctor.  I discussed return precautions for any worsening weakness, any numbness of the leg or incontinence.  Patient agreeable to plan of care.  We will follow-up with her doctor Monday.  I discussed return precautions.  Maria Davies was evaluated in Emergency Department on 03/24/2021 for the symptoms described in the history of present illness. She was evaluated in the context of the global COVID-19 pandemic, which necessitated consideration that the patient might be at risk for infection with the SARS-CoV-2 virus that causes COVID-19. Institutional protocols and algorithms that pertain to the evaluation of patients at risk for COVID-19 are in a state of rapid change based on information released by regulatory bodies including the CDC and federal and state organizations. These policies and algorithms were followed during the patient's care in the ED.  ____________________________________________   FINAL CLINICAL IMPRESSION(S) / ED DIAGNOSES  Right lower extremity weakness/discomfort   Harvest Dark, MD 03/24/21 1523

## 2021-03-27 ENCOUNTER — Ambulatory Visit: Payer: Self-pay

## 2021-03-27 NOTE — Telephone Encounter (Signed)
Pt has pain 2+ weeks in buttocks radiating down right leg and some right leg weakness.   PT saw seen at ED over the weekend and was to F/U today. Per protocol pt to be seen w/I 3 days.   Appointment made for thurs. 03/30/2021  Reviewed care advice.   Reason for Disposition  [1] Pain radiates into the thigh or further down the leg AND [2] one leg  Answer Assessment - Initial Assessment Questions 1. ONSET: "When did the pain begin?"      2 weeks ago 2. LOCATION: "Where does it hurt?" (upper, mid or lower back)     Right leg down 3. SEVERITY: "How bad is the pain?"  (e.g., Scale 1-10; mild, moderate, or severe)   - MILD (1-3): doesn't interfere with normal activities    - MODERATE (4-7): interferes with normal activities or awakens from sleep    - SEVERE (8-10): excruciating pain, unable to do any normal activities      8 4. PATTERN: "Is the pain constant?" (e.g., yes, no; constant, intermittent)      constant 5. RADIATION: "Does the pain shoot into your legs or elsewhere?"     Radiates down right leg 6. CAUSE:  "What do you think is causing the back pain?"      unknown 7. BACK OVERUSE:  "Any recent lifting of heavy objects, strenuous work or exercise?"     na 8. MEDICATIONS: "What have you taken so far for the pain?" (e.g., nothing, acetaminophen, NSAIDS)     Given prednisone and told to take tylenol 9. NEUROLOGIC SYMPTOMS: "Do you have any weakness, numbness, or problems with bowel/bladder control?"     numbness 10. OTHER SYMPTOMS: "Do you have any other symptoms?" (e.g., fever, abdominal pain, burning with urination, blood in urine)       na 11. PREGNANCY: "Is there any chance you are pregnant?" (e.g., yes, no; LMP)       na  Protocols used: Back Pain-A-AH

## 2021-03-28 ENCOUNTER — Inpatient Hospital Stay: Payer: Medicare Other | Attending: Nurse Practitioner | Admitting: Nurse Practitioner

## 2021-03-28 ENCOUNTER — Telehealth: Payer: Self-pay | Admitting: *Deleted

## 2021-03-28 VITALS — BP 90/59 | HR 91 | Temp 98.0°F | Resp 18

## 2021-03-28 DIAGNOSIS — R531 Weakness: Secondary | ICD-10-CM | POA: Insufficient documentation

## 2021-03-28 DIAGNOSIS — Z853 Personal history of malignant neoplasm of breast: Secondary | ICD-10-CM | POA: Diagnosis not present

## 2021-03-28 DIAGNOSIS — Z9221 Personal history of antineoplastic chemotherapy: Secondary | ICD-10-CM | POA: Insufficient documentation

## 2021-03-28 DIAGNOSIS — Z85828 Personal history of other malignant neoplasm of skin: Secondary | ICD-10-CM | POA: Insufficient documentation

## 2021-03-28 DIAGNOSIS — Z79899 Other long term (current) drug therapy: Secondary | ICD-10-CM | POA: Insufficient documentation

## 2021-03-28 DIAGNOSIS — R29818 Other symptoms and signs involving the nervous system: Secondary | ICD-10-CM | POA: Diagnosis not present

## 2021-03-28 DIAGNOSIS — R011 Cardiac murmur, unspecified: Secondary | ICD-10-CM | POA: Diagnosis not present

## 2021-03-28 DIAGNOSIS — G629 Polyneuropathy, unspecified: Secondary | ICD-10-CM | POA: Diagnosis not present

## 2021-03-28 DIAGNOSIS — Z85048 Personal history of other malignant neoplasm of rectum, rectosigmoid junction, and anus: Secondary | ICD-10-CM | POA: Diagnosis not present

## 2021-03-28 DIAGNOSIS — I10 Essential (primary) hypertension: Secondary | ICD-10-CM | POA: Insufficient documentation

## 2021-03-28 DIAGNOSIS — M8580 Other specified disorders of bone density and structure, unspecified site: Secondary | ICD-10-CM | POA: Diagnosis not present

## 2021-03-28 DIAGNOSIS — M5136 Other intervertebral disc degeneration, lumbar region: Secondary | ICD-10-CM | POA: Insufficient documentation

## 2021-03-28 DIAGNOSIS — E039 Hypothyroidism, unspecified: Secondary | ICD-10-CM | POA: Diagnosis not present

## 2021-03-28 DIAGNOSIS — M792 Neuralgia and neuritis, unspecified: Secondary | ICD-10-CM | POA: Diagnosis not present

## 2021-03-28 DIAGNOSIS — R29898 Other symptoms and signs involving the musculoskeletal system: Secondary | ICD-10-CM

## 2021-03-28 MED ORDER — PREDNISONE 20 MG PO TABS: 20.0000 mg | ORAL_TABLET | Freq: Every day | ORAL | 0 refills | Status: DC

## 2021-03-28 MED ORDER — GABAPENTIN 100 MG PO CAPS: 100.0000 mg | ORAL_CAPSULE | Freq: Three times a day (TID) | ORAL | 0 refills | Status: DC | PRN

## 2021-03-28 MED ORDER — CYCLOBENZAPRINE HCL 10 MG PO TABS: 10.0000 mg | ORAL_TABLET | Freq: Three times a day (TID) | ORAL | 0 refills | Status: DC | PRN

## 2021-03-28 NOTE — Telephone Encounter (Signed)
Patient called reporting that she spent a great deal of time in ER Friday to get pain relief and was diagnosed with Sciatica She also complains of "Pretty severe case of Neuropathy in my feet and some in my hands". She is requesting an appointment to come in and be seen so that she can get some pain medicine.  I see that she has called her PCP office regarding this and was given an appointment for 03/30/21.  Please advise.  INITIAL IMPRESSION / ASSESSMENT AND PLAN / ED COURSE   Pertinent labs & imaging results that were available during my care of the patient were reviewed by me and considered in my medical decision making (see chart for details).   Patient presents to the emergency department for 1 week of right lower extremity weakness that appears to be getting worse per patient.  Patient denies any numbness incontinence.  Patient states 2 weeks ago or so she was having lower back pain and did see a massage therapist.  Denies any upper extremity deficits.  Neurologic exam shows mild to moderate right lower extremity weakness otherwise normal exam.  Patient's lab work is largely nonrevealing.  Given the patient's symptoms we will proceed with an MRI of the brain as well as the lumbar spine to evaluate.   Unable to obtain an MRI due to defibrillator.  We will obtain a CT scan head as a precaution.  If the CT is normal patient would prefer to go home and follow-up with her doctor.  If CT is negative we will discharge with a prednisone taper possible sciatica.  Patient agreeable to plan of care.   CT scan head is negative for acute abnormality.  Patient strongly believes is likely her back causing the discomfort/weakness.  As patient cannot tolerate an MRI we will place the patient on a prednisone taper and have her follow-up with her doctor.  I discussed return precautions for any worsening weakness, any numbness of the leg or incontinence.  Patient agreeable to plan of care.  We will follow-up with her  doctor Monday.  I discussed return precautions.   Maria Davies was evaluated in Emergency Department on 03/24/2021 for the symptoms described in the history of present illness. She was evaluated in the context of the global COVID-19 pandemic, which necessitated consideration that the patient might be at risk for infection with the SARS-CoV-2 virus that causes COVID-19. Institutional protocols and algorithms that pertain to the evaluation of patients at risk for COVID-19 are in a state of rapid change based on information released by regulatory bodies including the CDC and federal and state organizations. These policies and algorithms were followed during the patient's care in the ED.   ____________________________________________     FINAL CLINICAL IMPRESSION(S) / ED DIAGNOSES   Right lower extremity weakness/discomfort   Harvest Dark, MD 03/24/21 1523

## 2021-03-28 NOTE — Progress Notes (Signed)
[facility]  [phone] [phone]  Patient Care Team: Valerie Roys, DO as PCP - General (Family Medicine) Guadalupe Maple, MD as PCP - Family Medicine (Family Medicine) Ronny Bacon, MD as Consulting Physician (General Surgery) Lloyd Huger, MD as Consulting Physician (Oncology)   Name of the patient: Maria Davies  [id number]  1941-03-02   Date of visit: 03/28/21  Diagnosis- Hx of Colon & Breast cancer  Chief complaint/ Reason for visit- Leg weakness and neuropathy  Heme/Onc history:  Oncology History  Colon cancer ([place])  10/28/2019 Initial Diagnosis   Colon cancer ([place])   11/13/2019 Cancer Staging   Staging form: Colon and Rectum, AJCC 8th Edition - Clinical stage from 11/13/2019: Stage IIIB (cT3, cN1a, cM0) - Signed by Lloyd Huger, MD on 11/13/2019   11/30/2019 -  Chemotherapy   The patient had palonosetron (ALOXI) injection 0.25 mg, 0.25 mg, Intravenous,  Once, 11 of 12 cycles Administration: 0.25 mg (11/30/2019), 0.25 mg (12/14/2019), 0.25 mg (02/23/2020), 0.25 mg (03/14/2020), 0.25 mg (03/28/2020), 0.25 mg (04/11/2020), 0.25 mg (04/25/2020), 0.25 mg (12/28/2019), 0.25 mg (01/11/2020), 0.25 mg (01/25/2020), 0.25 mg (02/08/2020) leucovorin 750 mg in dextrose 5 % 250 mL infusion, 744 mg, Intravenous,  Once, 11 of 12 cycles Administration: 750 mg (11/30/2019), 750 mg (12/14/2019), 750 mg (02/23/2020), 750 mg (03/14/2020), 750 mg (03/28/2020), 750 mg (04/11/2020), 650 mg (04/25/2020), 750 mg (12/28/2019), 750 mg (01/11/2020), 750 mg (01/25/2020), 750 mg (02/08/2020) oxaliplatin (ELOXATIN) 150 mg in dextrose 5 % 500 mL chemo infusion, 160 mg, Intravenous,  Once, 11 of 12 cycles Administration: 150 mg (11/30/2019), 150 mg (12/14/2019), 150 mg (02/23/2020), 150 mg (03/14/2020), 150 mg (03/28/2020), 150 mg (04/11/2020), 145 mg (04/25/2020), 150 mg (12/28/2019), 150 mg (01/11/2020), 150 mg (01/25/2020), 150 mg  (02/08/2020) fluorouracil (ADRUCIL) chemo injection 750 mg, 400 mg/m2 = 750 mg, Intravenous,  Once, 11 of 12 cycles Administration: 750 mg (11/30/2019), 750 mg (12/14/2019), 750 mg (02/23/2020), 750 mg (03/14/2020), 750 mg (03/28/2020), 750 mg (04/11/2020), 650 mg (04/25/2020), 750 mg (12/28/2019), 750 mg (01/11/2020), 750 mg (01/25/2020), 750 mg (02/08/2020) fluorouracil (ADRUCIL) 4,450 mg in sodium chloride 0.9 % 61 mL chemo infusion, 2,400 mg/m2 = 4,450 mg, Intravenous, 1 Day/Dose, 11 of 12 cycles Administration: 4,450 mg (11/30/2019), 4,450 mg (12/14/2019), 4,450 mg (02/23/2020), 4,450 mg (03/14/2020), 4,450 mg (03/28/2020), 4,450 mg (04/11/2020), 4,050 mg (04/25/2020), 4,450 mg (12/28/2019), 4,450 mg (01/11/2020), 4,450 mg (01/25/2020), 4,450 mg (02/08/2020)   for chemotherapy treatment.       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 Over past couple of months she has noticed increased lower back pain, weakness, and has had several falls. She has a prior history of 'back surgery'. She saw a massage therapist but symptoms persisted. She has a history of Over past week her right lower leg has become increasingly weak and numb. She was seen in ER yesterday for symptoms. MRI was recommended but contraindicated d/t pacemaker. CT head wo contrast was negative for acute finding. She was started on prednisone taper in ER. At baseline patient has some numbness and tingling of her toes but is independent of her ADLs. Today, she is using a wheelchair and requires assistance to ambulate. No vision changes, loss of bowel or bladder function. Denies back pain. No loss of bowel or bladder function. Otherwise she feels well.   ECOG FS:3 - Symptomatic, >  50% confined to bed  Review of systems- Review of Systems  Constitutional:  Negative for chills, fever, malaise/fatigue and weight loss.  HENT:  Negative  for hearing loss, nosebleeds, sore throat and tinnitus.   Eyes:  Negative for blurred vision and double vision.  Respiratory:  Negative for cough, hemoptysis, shortness of breath and wheezing.   Cardiovascular:  Negative for chest pain, palpitations and leg swelling.  Gastrointestinal:  Negative for abdominal pain, blood in stool, constipation, diarrhea, melena, nausea and vomiting.  Genitourinary:  Negative for dysuria and urgency.  Musculoskeletal:  Positive for falls. Negative for back pain, joint pain and myalgias.  Skin:  Negative for itching and rash.  Neurological:  Positive for tingling, sensory change and weakness. Negative for dizziness, loss of consciousness and headaches.  Endo/Heme/Allergies:  Negative for environmental allergies. Does not bruise/bleed easily.  Psychiatric/Behavioral:  Negative for depression. The patient is not nervous/anxious and does not have insomnia.     Current treatment- surveillance  No Known Allergies  Past Medical History:  Diagnosis Date   Anemia    Arthritis    Basal cell carcinoma    back   Breast cancer ([place]) 2010   RT LUMPECTOMY   Cancer ([place])    rt breast   Chemotherapy induced nausea and vomiting    Colon cancer ([place]) 2021   colon cancer   Heart murmur    Hypertension    Hypothyroidism    currently not on meds   Osteopenia    Personal history of chemotherapy 2010   right breast ca   Personal history of radiation therapy 2010   right breast ca   Port-A-Cath in place     Past Surgical History:  Procedure Laterality Date   aortic valve stenosis  08/11/2020   BACK SURGERY     BREAST EXCISIONAL BIOPSY Right 03/2009   radiation and chemo +   BREAST EXCISIONAL BIOPSY Bilateral    NEG   BREAST SURGERY     CATARACT EXTRACTION W/ INTRAOCULAR LENS  IMPLANT, BILATERAL     CHOLECYSTECTOMY     COLONOSCOPY WITH PROPOFOL N/A 10/19/2019   Procedure: COLONOSCOPY WITH PROPOFOL-attempted, unable to perform poor prep;  Surgeon: Lucilla Lame, MD;  Location: [facility];  Service: Endoscopy;  Laterality: N/A;  priority 3   COLONOSCOPY WITH PROPOFOL N/A 10/20/2019   Procedure: COLONOSCOPY WITH PROPOFOL;  Surgeon: Lucilla Lame, MD;  Location: [facility] ENDOSCOPY;  Service: Endoscopy;  Laterality: N/A;   ESOPHAGEAL DILATION  10/19/2019   Procedure: ESOPHAGEAL DILATION;  Surgeon: Lucilla Lame, MD;  Location: [facility];  Service: Endoscopy;;   ESOPHAGOGASTRODUODENOSCOPY (EGD) WITH PROPOFOL N/A 10/19/2019   Procedure: ESOPHAGOGASTRODUODENOSCOPY (EGD) WITH PROPOFOL;  Surgeon: Lucilla Lame, MD;  Location: [facility];  Service: Endoscopy;  Laterality: N/A;   PORTACATH PLACEMENT Right 11/23/2019   Procedure: INSERTION PORT-A-CATH;  Surgeon: Ronny Bacon, MD;  Location: [facility];  Service: General;  Laterality: Right;    Social History   Socioeconomic History   Marital status: Widowed    Spouse name: Not on file   Number of children: Not on file   Years of education: Not on file   Highest education level: Not on file  Occupational History   Occupation: retired   Tobacco Use   Smoking status: Never   Smokeless tobacco: Never  Vaping Use   Vaping Use: Never used  Substance and Sexual Activity   Alcohol use: Yes    Alcohol/week: 2.0 standard drinks    Types:  2 Standard drinks or equivalent per week    Comment: only on weeks not having treatment   Drug use: No   Sexual activity: Not Currently  Other Topics Concern   Not on file  Social History Narrative   Not on file   Social Determinants of Health   Financial Resource Strain: Low Risk    Difficulty of Paying Living Expenses: Not hard at all  Food Insecurity: No Food Insecurity   Worried About Charity fundraiser in the Last Year: Never true   [place] in the Last Year: Never true  Transportation Needs: No Transportation Needs   Lack of Transportation (Medical): No   Lack of Transportation (Non-Medical): No  Physical Activity: Inactive    Days of Exercise per Week: 0 days   Minutes of Exercise per Session: 0 min  Stress: No Stress Concern Present   Feeling of Stress : Not at all  Social Connections: Not on file  Intimate Partner Violence: Not on file    Family History  Problem Relation Age of Onset   Cancer Mother    Heart disease Father    Breast cancer Paternal Aunt 37     Current Outpatient Medications:    acetaminophen (TYLENOL) 325 MG tablet, Take 325 mg by mouth 3 (three) times daily as needed for moderate pain or headache., Disp: , Rfl:    amLODipine (NORVASC) 5 MG tablet, Take 1 tablet (5 mg total) by mouth daily., Disp: 90 tablet, Rfl: 1   aspirin EC 81 MG tablet, Take 81 mg by mouth daily., Disp: , Rfl:    benazepril (LOTENSIN) 40 MG tablet, Take 1 tablet (40 mg total) by mouth daily., Disp: 90 tablet, Rfl: 1   lidocaine-prilocaine (EMLA) cream, Apply to affected area once, Disp: 30 g, Rfl: 3   methimazole (TAPAZOLE) 5 MG tablet, Take 5 mg by mouth daily., Disp: , Rfl:    metoprolol succinate (TOPROL-XL) 50 MG 24 hr tablet, Take 1 tablet (50 mg total) by mouth daily. Take with or immediately following a meal., Disp: 90 tablet, Rfl: 1   Multiple Vitamin (MULTI-VITAMINS) TABS, Take 1 tablet by mouth daily. , Disp: , Rfl:    predniSONE (DELTASONE) 10 MG tablet, Take 1 tablet (10 mg total) by mouth daily. Day 1-3: take 4 tablets PO daily Day 4-6: take 3 tablets PO daily Day 7-9: take 2 tablets PO daily Day 10-12: take 1 tablet PO daily, Disp: 30 tablet, Rfl: 0   Probiotic Product (PROBIOTIC ADVANCED PO), Take 1-2 capsules by mouth daily. When taking antibiotics pt will take 2 capsules instead of 1, normally just takes 1 per day otherwise, Disp: , Rfl:    Sodium Hyaluronate, oral, (HYALURONIC ACID PO), Take 1 tablet by mouth daily., Disp: , Rfl:    Cholecalciferol (VITAMIN D3) 50 MCG (2000 UT) capsule, Take 4,000 Units by mouth daily. (Patient not taking: No sig reported), Disp: , Rfl:    methimazole (TAPAZOLE) 10 MG  tablet, Take 10 mg by mouth daily. (Patient not taking: Reported on 03/28/2021), Disp: , Rfl:    ondansetron (ZOFRAN) 8 MG tablet, Take 1 tablet (8 mg total) by mouth 2 (two) times daily as needed for refractory nausea / vomiting. (Patient not taking: No sig reported), Disp: 60 tablet, Rfl: 2   Phenylephrine HCl (NEO-SYNEPHRINE NA), Place 1 spray into the nose daily as needed (congestion). (Patient not taking: Reported on 03/28/2021), Disp: , Rfl:    potassium chloride SA (KLOR-CON) 20 MEQ  tablet, Take 1 tablet (20 mEq total) by mouth 2 (two) times daily. (Patient not taking: Reported on 03/28/2021), Disp: 60 tablet, Rfl: 2   prochlorperazine (COMPAZINE) 10 MG tablet, Take 1 tablet (10 mg total) by mouth every 6 (six) hours as needed (Nausea or vomiting). (Patient not taking: No sig reported), Disp: 60 tablet, Rfl: 2   silver sulfADIAZINE (SILVADENE) 1 % cream, Apply 1 application topically daily. (Patient not taking: Reported on 03/28/2021), Disp: 50 g, Rfl: 0 No current facility-administered medications for this visit.  Facility-Administered Medications Ordered in Other Visits:    sodium chloride flush (NS) 0.9 % injection 10 mL, 10 mL, Intravenous, PRN, Lloyd Huger, MD, 10 mL at 03/07/20 0836   sodium chloride flush (NS) 0.9 % injection 10 mL, 10 mL, Intravenous, PRN, Lloyd Huger, MD, 10 mL at 06/21/20 1259  Physical exam:  Vitals:   03/28/21 1146  BP: (!) 90/59  Pulse: 91  Resp: 18  Temp: 98 F (36.7 C)  TempSrc: Tympanic  SpO2: 99%   Physical Exam Constitutional:      Appearance: She is not ill-appearing.     Comments: Accompanied by daughter. Sitting in wheelchair  Musculoskeletal:     Thoracic back: No deformity, tenderness or bony tenderness.     Lumbar back: No deformity, tenderness or bony tenderness.     Right hip: Decreased strength.     Left hip: Normal strength.     Right upper leg: No swelling, deformity or tenderness.     Left upper leg: No swelling,  deformity or tenderness.     Right knee: No swelling. No tenderness.     Left knee: No swelling. No tenderness.     Right lower leg: No swelling, deformity or tenderness.     Left lower leg: No swelling, deformity or tenderness.     Right ankle: No swelling or deformity. Anterior drawer test negative. Normal pulse.     Left ankle: No swelling or deformity. Anterior drawer test negative. Normal pulse.     Comments: Bruising on left buttock, mildly ttp  Neurological:     Mental Status: She is alert and oriented to person, place, and time.  Psychiatric:        Mood and Affect: Mood normal.        Behavior: Behavior normal.     CMP Latest Ref Rng & Units 03/24/2021  Glucose 70 - 99 mg/dL 126(H)  BUN 8 - 23 mg/dL 8  Creatinine 0.44 - 1.00 mg/dL 0.46  Sodium 135 - 145 mmol/L 134(L)  Potassium 3.5 - 5.1 mmol/L 3.4(L)  Chloride 98 - 111 mmol/L 96(L)  CO2 22 - 32 mmol/L 26  Calcium 8.9 - 10.3 mg/dL 9.7  Total Protein 6.0 - 8.5 g/dL -  Total Bilirubin 0.0 - 1.2 mg/dL -  Alkaline Phos 44 - 121 IU/L -  AST 0 - 40 IU/L -  ALT 0 - 32 IU/L -   CBC Latest Ref Rng & Units 03/24/2021  WBC 4.0 - 10.5 K/uL 7.3  Hemoglobin 12.0 - 15.0 g/dL 15.2(H)  Hematocrit 36.0 - 46.0 % 41.1  Platelets 150 - 400 K/uL 163    No images are attached to the encounter.  CT HEAD WO CONTRAST (5MM)  Result Date: 03/24/2021 CLINICAL DATA:  Right lower extremity weakness. EXAM: CT HEAD WITHOUT CONTRAST TECHNIQUE: Contiguous axial images were obtained from the base of the skull through the vertex without intravenous contrast. COMPARISON:  None. FINDINGS: Brain: No evidence of acute  infarction, hemorrhage, hydrocephalus, extra-axial collection or mass lesion/mass effect. Vascular: No hyperdense vessel or unexpected calcification. Skull: Normal. Negative for fracture or focal lesion. Sinuses/Orbits: No acute finding. Other: None. IMPRESSION: No acute intracranial abnormality seen. Electronically Signed   By: Marijo Conception  M.D.   On: 03/24/2021 15:01    Assessment and plan- Patient is a 80 y.o. female with history of chemotherapy induced peripheral neuropathy with acute extremity weakness.   Etiology unclear but I question DDD of lumbar spine causing lumbar radiculopathy particularly given her history of 'back surgery'. I reviewed records and was unable to locate details of this surgery. Agree that MRI may be best imaging but if unable to obtain we could also consider CT. In the interim, will order xrays of lumbar and sacrum to evaluate for fracture given her recent falls. No bone tenderness on exam.   Worsening symptoms may be related to tapering of steroids. Will give course of prednisone 20 mg daily x 10 days. If extended course is indicated will need to wean dosage. Will start on gabapentin for neuropathic pain particularly at night. Can also trial flexeril. Advised that these medications may make her drowsy.   I'd like to have Dr. Mickeal Skinner evaluate her for his input; referral sent.   Return to clinic if symptoms don't improve or worsen.    Visit Diagnosis 1. Weakness of right lower extremity   2. Neuropathic pain    Patient expressed understanding and was in agreement with this plan. She also understands that She can call clinic at any time with any questions, concerns, or complaints.   Thank you for allowing me to participate in the care of this very pleasant patient.   Beckey Rutter, DNP, AGNP-[facility]  CC: Dr. Grayland Ormond, Dr. Mickeal Skinner

## 2021-03-29 ENCOUNTER — Ambulatory Visit
Admission: RE | Admit: 2021-03-29 | Discharge: 2021-03-29 | Disposition: A | Discharge status: Home / Self Care | Payer: Medicare Other | Source: Ambulatory Visit | Attending: Nurse Practitioner | Admitting: Nurse Practitioner

## 2021-03-29 ENCOUNTER — Ambulatory Visit
Admission: RE | Admit: 2021-03-29 | Discharge: 2021-03-29 | Disposition: A | Discharge status: Home / Self Care | Payer: Medicare Other | Attending: Nurse Practitioner | Admitting: Nurse Practitioner

## 2021-03-29 DIAGNOSIS — M792 Neuralgia and neuritis, unspecified: Secondary | ICD-10-CM | POA: Insufficient documentation

## 2021-03-29 DIAGNOSIS — M533 Sacrococcygeal disorders, not elsewhere classified: Secondary | ICD-10-CM | POA: Diagnosis not present

## 2021-03-29 DIAGNOSIS — M545 Low back pain, unspecified: Secondary | ICD-10-CM | POA: Diagnosis not present

## 2021-03-29 DIAGNOSIS — R29898 Other symptoms and signs involving the musculoskeletal system: Secondary | ICD-10-CM

## 2021-03-29 IMAGING — CR DG LUMBAR SPINE 2-3V
1 series · 4 of 4 positions shown · non-contrast
Comparison: Concurrently obtained radiographs of the sacrum

CLINICAL DATA: Neuropathic pain, right lower extremity weakness,
recent fall

EXAM:
LUMBAR SPINE - 2-3 VIEW

[Series 1: dg lumbar spine 2-3 views · 0.14mm/px · 4 of 4 slices shown]
[im 1/4]
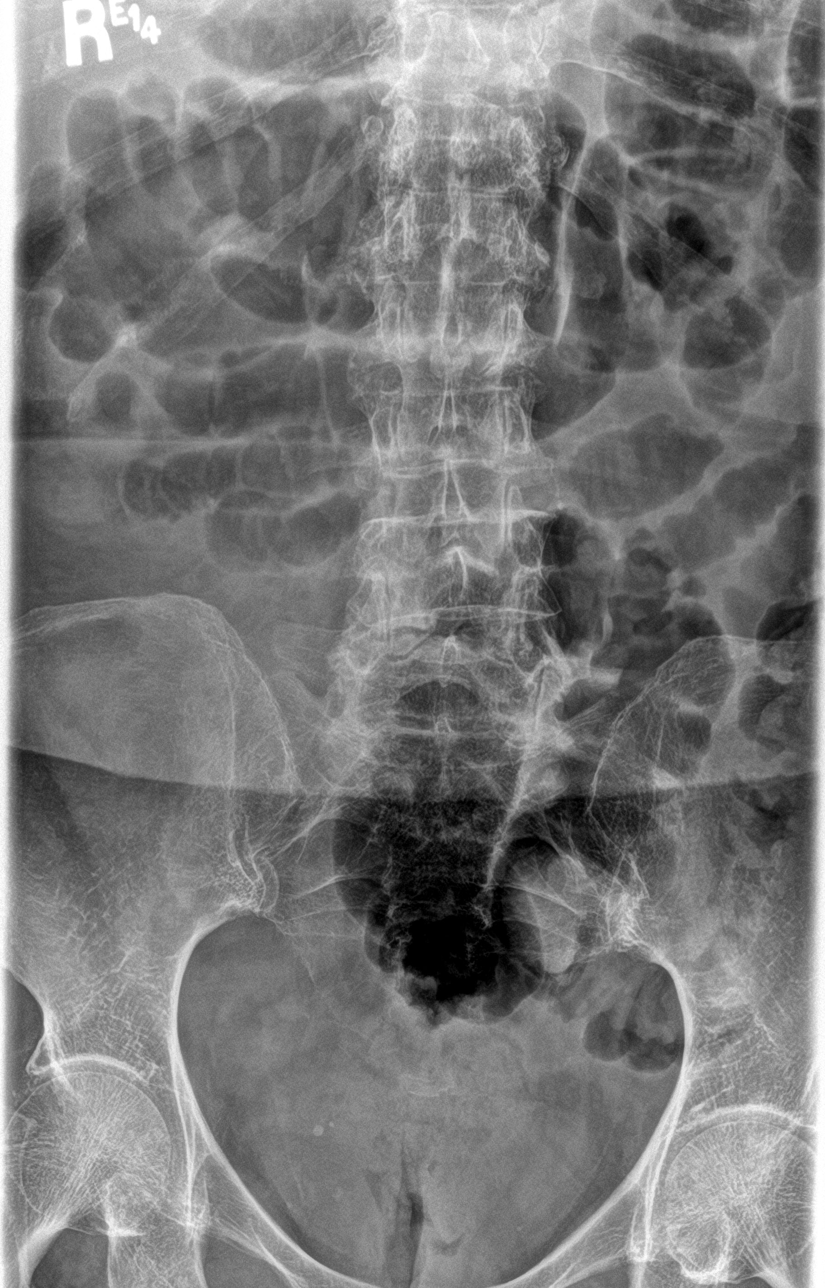
[im 2/4]
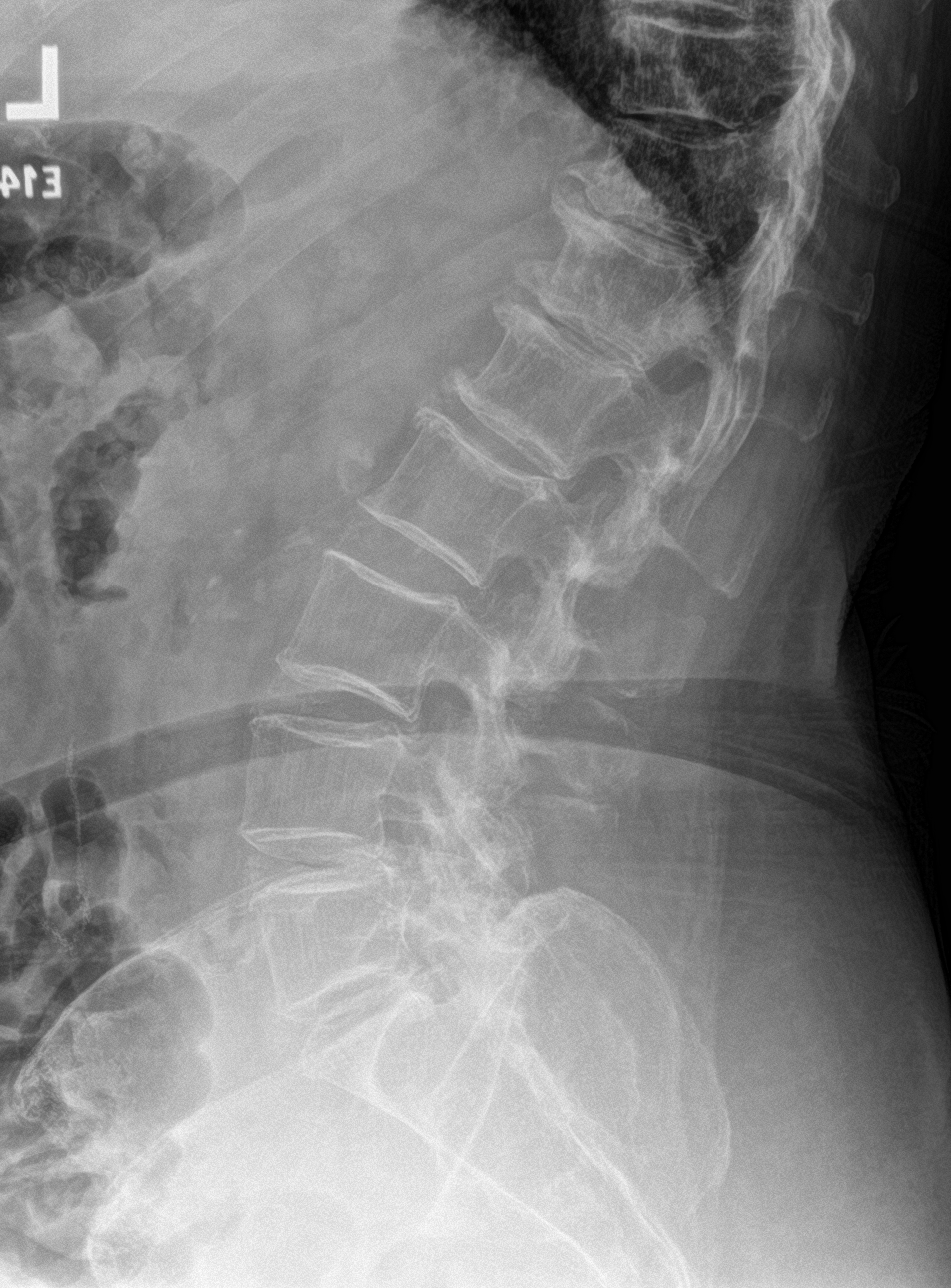
[im 3/4]
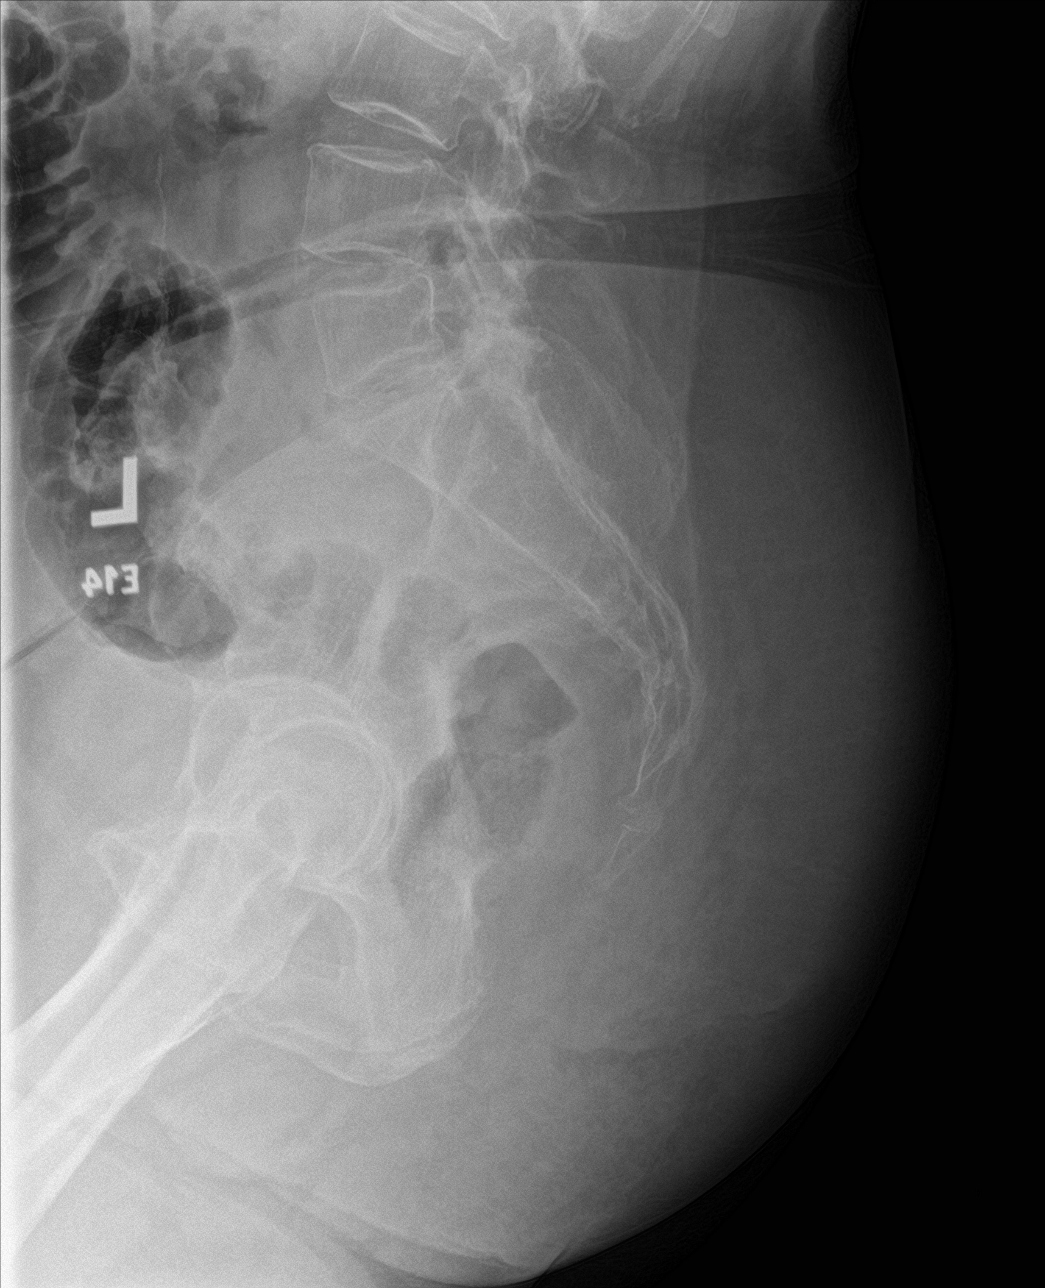
[im 4/4]
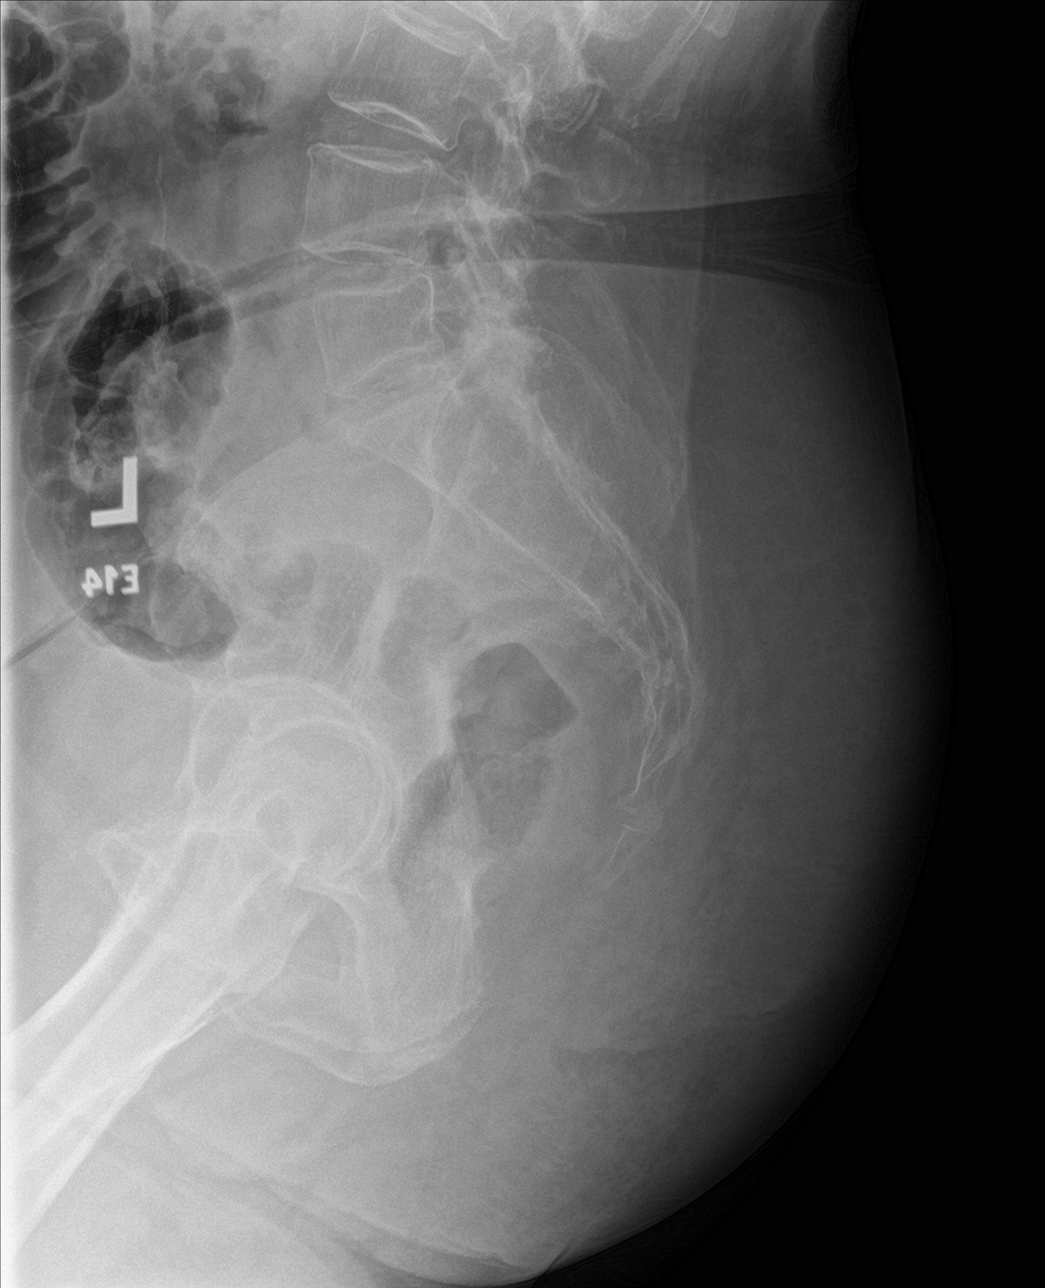

[4 of 4 positions shown; findings below may reference images not displayed]

FINDINGS: No evidence of acute fracture. The vertebral body heights are
maintained. There is accentuated lumbar lordosis. Multilevel
degenerative changes are present most prominent at the thoracolumbar
interface involving T11-T12 and T12-L1 as well as in the lower
lumbar spine at L4-L5 and L5-S1. Sclerosis and hypertrophy of the
lower lumbar facets suggests underlying facet arthropathy. No lytic
or blastic osseous lesion. Trace atherosclerotic calcifications
visible along the abdominal aorta.
IMPRESSION: 1. No evidence of acute fracture or malalignment.
2. Accentuated lumbar lordosis.
3. Multilevel degenerative disc disease focal at the thoracolumbar
junction and lower lumbar spine.
4. Lower lumbar facet arthropathy.
5. Mild aortic atherosclerotic calcifications.

## 2021-03-30 ENCOUNTER — Ambulatory Visit: Payer: Medicare Other | Admitting: Family Medicine

## 2021-03-31 ENCOUNTER — Other Ambulatory Visit: Payer: Self-pay

## 2021-03-31 ENCOUNTER — Inpatient Hospital Stay (HOSPITAL_BASED_OUTPATIENT_CLINIC_OR_DEPARTMENT_OTHER): Payer: Medicare Other | Admitting: Internal Medicine

## 2021-03-31 ENCOUNTER — Inpatient Hospital Stay: Payer: Medicare Other

## 2021-03-31 VITALS — BP 112/58 | HR 91 | Temp 97.3°F

## 2021-03-31 DIAGNOSIS — G5793 Unspecified mononeuropathy of bilateral lower limbs: Secondary | ICD-10-CM

## 2021-03-31 DIAGNOSIS — C182 Malignant neoplasm of ascending colon: Secondary | ICD-10-CM

## 2021-03-31 DIAGNOSIS — M5136 Other intervertebral disc degeneration, lumbar region: Secondary | ICD-10-CM | POA: Diagnosis not present

## 2021-03-31 DIAGNOSIS — Z85048 Personal history of other malignant neoplasm of rectum, rectosigmoid junction, and anus: Secondary | ICD-10-CM | POA: Diagnosis not present

## 2021-03-31 DIAGNOSIS — R29898 Other symptoms and signs involving the musculoskeletal system: Secondary | ICD-10-CM | POA: Diagnosis not present

## 2021-03-31 DIAGNOSIS — R29818 Other symptoms and signs involving the nervous system: Secondary | ICD-10-CM | POA: Diagnosis not present

## 2021-03-31 DIAGNOSIS — G629 Polyneuropathy, unspecified: Secondary | ICD-10-CM | POA: Diagnosis not present

## 2021-03-31 DIAGNOSIS — Z853 Personal history of malignant neoplasm of breast: Secondary | ICD-10-CM | POA: Diagnosis not present

## 2021-03-31 DIAGNOSIS — R531 Weakness: Secondary | ICD-10-CM | POA: Diagnosis not present

## 2021-03-31 LAB — COMPREHENSIVE METABOLIC PANEL
ALT: 33 U/L (ref 0–44)
AST: 34 U/L (ref 15–41)
Albumin: 4.1 g/dL (ref 3.5–5.0)
Alkaline Phosphatase: 78 U/L (ref 38–126)
Anion gap: 8 (ref 5–15)
BUN: 11 mg/dL (ref 8–23)
CO2: 25 mmol/L (ref 22–32)
Calcium: 9.4 mg/dL (ref 8.9–10.3)
Chloride: 102 mmol/L (ref 98–111)
Creatinine, Ser: 0.57 mg/dL (ref 0.44–1.00)
GFR, Estimated: >60 mL/min (ref >60)
Glucose, Bld: 121 mg/dL — ABNORMAL HIGH (ref 70–99)
Potassium: 3.2 mmol/L — ABNORMAL LOW (ref 3.5–5.1)
Sodium: 135 mmol/L (ref 135–145)
Total Bilirubin: 1.1 mg/dL (ref 0.3–1.2)
Total Protein: 7.1 g/dL (ref 6.5–8.1)

## 2021-03-31 LAB — LACTATE DEHYDROGENASE: LDH: 272 U/L — ABNORMAL HIGH (ref 98–192)

## 2021-03-31 LAB — TSH: TSH: 2.322 u[IU]/mL (ref 0.350–4.500)

## 2021-03-31 LAB — CK: Total CK: 71 U/L (ref 38–234)

## 2021-03-31 NOTE — Progress Notes (Signed)
[facility] [place], [place] [address] [phone]   New Patient Evaluation  Date of Service: 03/31/21 Patient Name: Maria Davies Patient MRN: [id number] Patient DOB: Dec 21, 1940 Provider: Ventura Sellers, MD  Identifying Statement:  Maria Davies is a 80 y.o. female with Right leg weakness - Plan: CK, total, Aldolase, TSH, MR LUMBAR SPINE W WO CONTRAST, Lactate dehydrogenase (LDH), CMP ([place] only)  Unspecified mononeuropathy of bilateral lower limbs  - Plan: TSH  Bilateral leg weakness who presents for initial consultation and evaluation regarding cancer associated neurologic deficits.    Referring Provider: Valerie Roys, DO [place],   [id number]  Primary Cancer:  Oncologic History: Oncology History  Colon cancer ([place])  10/28/2019 Initial Diagnosis   Colon cancer ([place])   11/13/2019 Cancer Staging   Staging form: Colon and Rectum, AJCC 8th Edition - Clinical stage from 11/13/2019: Stage IIIB (cT3, cN1a, cM0) - Signed by Lloyd Huger, MD on 11/13/2019   11/30/2019 -  Chemotherapy   The patient had palonosetron (ALOXI) injection 0.25 mg, 0.25 mg, Intravenous,  Once, 11 of 12 cycles Administration: 0.25 mg (11/30/2019), 0.25 mg (12/14/2019), 0.25 mg (02/23/2020), 0.25 mg (03/14/2020), 0.25 mg (03/28/2020), 0.25 mg (04/11/2020), 0.25 mg (04/25/2020), 0.25 mg (12/28/2019), 0.25 mg (01/11/2020), 0.25 mg (01/25/2020), 0.25 mg (02/08/2020) leucovorin 750 mg in dextrose 5 % 250 mL infusion, 744 mg, Intravenous,  Once, 11 of 12 cycles Administration: 750 mg (11/30/2019), 750 mg (12/14/2019), 750 mg (02/23/2020), 750 mg (03/14/2020), 750 mg (03/28/2020), 750 mg (04/11/2020), 650 mg (04/25/2020), 750 mg (12/28/2019), 750 mg (01/11/2020), 750 mg (01/25/2020), 750 mg (02/08/2020) oxaliplatin (ELOXATIN) 150 mg in dextrose 5 % 500 mL chemo infusion, 160 mg, Intravenous,  Once, 11 of 12 cycles Administration: 150 mg (11/30/2019), 150  mg (12/14/2019), 150 mg (02/23/2020), 150 mg (03/14/2020), 150 mg (03/28/2020), 150 mg (04/11/2020), 145 mg (04/25/2020), 150 mg (12/28/2019), 150 mg (01/11/2020), 150 mg (01/25/2020), 150 mg (02/08/2020) fluorouracil (ADRUCIL) chemo injection 750 mg, 400 mg/m2 = 750 mg, Intravenous,  Once, 11 of 12 cycles Administration: 750 mg (11/30/2019), 750 mg (12/14/2019), 750 mg (02/23/2020), 750 mg (03/14/2020), 750 mg (03/28/2020), 750 mg (04/11/2020), 650 mg (04/25/2020), 750 mg (12/28/2019), 750 mg (01/11/2020), 750 mg (01/25/2020), 750 mg (02/08/2020) fluorouracil (ADRUCIL) 4,450 mg in sodium chloride 0.9 % 61 mL chemo infusion, 2,400 mg/m2 = 4,450 mg, Intravenous, 1 Day/Dose, 11 of 12 cycles Administration: 4,450 mg (11/30/2019), 4,450 mg (12/14/2019), 4,450 mg (02/23/2020), 4,450 mg (03/14/2020), 4,450 mg (03/28/2020), 4,450 mg (04/11/2020), 4,050 mg (04/25/2020), 4,450 mg (12/28/2019), 4,450 mg (01/11/2020), 4,450 mg (01/25/2020), 4,450 mg (02/08/2020)   for chemotherapy treatment.       History of Present Illness: The patient's records from the referring physician were obtained and reviewed and the patient interviewed to confirm this HPI.  Maria Davies presents to clinic today to review recent complaints of leg weakness.  She describes ~1 week history of difficulty with gait, balance, getting up from toilet due to weakness in both legs, but the right more than the left.  She also describes new onset lower back pain around this time.  Over the past several days, the pain has been minimal but weakness has persisted.  She is now walking with a walker, whereas one week ago she required no assistance.  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    Medications: Current Outpatient Medications on File Prior to Visit  Medication Sig Dispense Refill   acetaminophen (TYLENOL) 325 MG tablet Take 325 mg by mouth 3  (three) times daily as needed for moderate pain or headache.     amLODipine (NORVASC) 5 MG tablet Take 1 tablet (5 mg total) by mouth daily. 90 tablet 1   aspirin EC 81 MG tablet Take 81 mg by mouth daily.     benazepril (LOTENSIN) 40 MG tablet Take 1 tablet (40 mg total) by mouth daily. 90 tablet 1   cyclobenzaprine (FLEXERIL) 10 MG tablet Take 1 tablet (10 mg total) by mouth 3 (three) times daily as needed for muscle spasms. 30 tablet 0   gabapentin (NEURONTIN) 100 MG capsule Take 1 capsule (100 mg total) by mouth 3 (three) times daily as needed. 30 capsule 0   lidocaine-prilocaine (EMLA) cream Apply to affected area once 30 g 3   methimazole (TAPAZOLE) 5 MG tablet Take 5 mg by mouth daily.     metoprolol succinate (TOPROL-XL) 50 MG 24 hr tablet Take 1 tablet (50 mg total) by mouth daily. Take with or immediately following a meal. 90 tablet 1   Multiple Vitamin (MULTI-VITAMINS) TABS Take 1 tablet by mouth daily.      predniSONE (DELTASONE) 20 MG tablet Take 1 tablet (20 mg total) by mouth daily. Once a day with food x 10 days. 10 tablet 0   Probiotic Product (PROBIOTIC ADVANCED PO) Take 1-2 capsules by mouth daily. When taking antibiotics pt will take 2 capsules instead of 1, normally just takes 1 per day otherwise     Sodium Hyaluronate, oral, (HYALURONIC ACID PO) Take 1 tablet by mouth daily.     Cholecalciferol (VITAMIN D3) 50 MCG (2000 UT) capsule Take 4,000 Units by mouth daily. (Patient not taking: No sig reported)     methimazole (TAPAZOLE) 10 MG tablet Take 10 mg by mouth daily. (Patient not taking: No sig reported)     ondansetron (ZOFRAN) 8 MG tablet Take 1 tablet (8 mg total) by mouth 2 (two) times daily as needed for refractory nausea / vomiting. (Patient not taking: No sig reported) 60 tablet 2   Phenylephrine HCl (NEO-SYNEPHRINE NA) Place 1 spray into the nose daily as needed (congestion). (Patient not taking: No sig reported)     potassium chloride SA (KLOR-CON) 20 MEQ tablet Take 1  tablet (20 mEq total) by mouth 2 (two) times daily. (Patient not taking: No sig reported) 60 tablet 2   prochlorperazine (COMPAZINE) 10 MG tablet Take 1 tablet (10 mg total) by mouth every 6 (six) hours as needed (Nausea or vomiting). (Patient not taking: No sig reported) 60 tablet 2   silver sulfADIAZINE (SILVADENE) 1 % cream Apply 1 application topically daily. (Patient not taking: No sig reported) 50 g 0   Current Facility-Administered Medications on File Prior to Visit  Medication Dose Route Frequency Provider Last Rate Last Admin   sodium chloride flush (NS) 0.9 % injection 10 mL  10 mL Intravenous PRN Lloyd Huger, MD   10 mL at 03/07/20 0836   sodium chloride flush (NS) 0.9 % injection 10 mL  10 mL Intravenous PRN Lloyd Huger, MD   10 mL at 06/21/20 1259    Allergies: No Known Allergies Past Medical History:  Past Medical History:  Diagnosis Date   Anemia    Arthritis    Basal cell carcinoma    back   Breast cancer ([place])  2010   RT LUMPECTOMY   Cancer ([place])    rt breast   Chemotherapy induced nausea and vomiting    Colon cancer ([place]) 2021   colon cancer   Heart murmur    Hypertension    Hypothyroidism    currently not on meds   Osteopenia    Personal history of chemotherapy 2010   right breast ca   Personal history of radiation therapy 2010   right breast ca   Port-A-Cath in place    Past Surgical History:  Past Surgical History:  Procedure Laterality Date   aortic valve stenosis  08/11/2020   BACK SURGERY     BREAST EXCISIONAL BIOPSY Right 03/2009   radiation and chemo +   BREAST EXCISIONAL BIOPSY Bilateral    NEG   BREAST SURGERY     CATARACT EXTRACTION W/ INTRAOCULAR LENS  IMPLANT, BILATERAL     CHOLECYSTECTOMY     COLONOSCOPY WITH PROPOFOL N/A 10/19/2019   Procedure: COLONOSCOPY WITH PROPOFOL-attempted, unable to perform poor prep;  Surgeon: Lucilla Lame, MD;  Location: [facility];  Service: Endoscopy;  Laterality: N/A;  priority 3    COLONOSCOPY WITH PROPOFOL N/A 10/20/2019   Procedure: COLONOSCOPY WITH PROPOFOL;  Surgeon: Lucilla Lame, MD;  Location: [facility] ENDOSCOPY;  Service: Endoscopy;  Laterality: N/A;   ESOPHAGEAL DILATION  10/19/2019   Procedure: ESOPHAGEAL DILATION;  Surgeon: Lucilla Lame, MD;  Location: [facility];  Service: Endoscopy;;   ESOPHAGOGASTRODUODENOSCOPY (EGD) WITH PROPOFOL N/A 10/19/2019   Procedure: ESOPHAGOGASTRODUODENOSCOPY (EGD) WITH PROPOFOL;  Surgeon: Lucilla Lame, MD;  Location: [facility];  Service: Endoscopy;  Laterality: N/A;   PORTACATH PLACEMENT Right 11/23/2019   Procedure: INSERTION PORT-A-CATH;  Surgeon: Ronny Bacon, MD;  Location: [facility];  Service: General;  Laterality: Right;   Social History:  Social History   Socioeconomic History   Marital status: Widowed    Spouse name: Not on file   Number of children: Not on file   Years of education: Not on file   Highest education level: Not on file  Occupational History   Occupation: retired   Tobacco Use   Smoking status: Never   Smokeless tobacco: Never  Vaping Use   Vaping Use: Never used  Substance and Sexual Activity   Alcohol use: Yes    Alcohol/week: 2.0 standard drinks    Types: 2 Standard drinks or equivalent per week    Comment: only on weeks not having treatment   Drug use: No   Sexual activity: Not Currently  Other Topics Concern   Not on file  Social History Narrative   Not on file   Social Determinants of Health   Financial Resource Strain: Low Risk    Difficulty of Paying Living Expenses: Not hard at all  Food Insecurity: No Food Insecurity   Worried About Charity fundraiser in the Last Year: Never true   Ran Out of Food in the Last Year: Never true  Transportation Needs: No Transportation Needs   Lack of Transportation (Medical): No   Lack of Transportation (Non-Medical): No  Physical Activity: Inactive   Days of Exercise per Week: 0 days   Minutes of Exercise per Session: 0 min   Stress: No Stress Concern Present   Feeling of Stress : Not at all  Social Connections: Not on file  Intimate Partner Violence: Not on file   Family History:  Family History  Problem Relation Age of Onset   Cancer Mother    Heart disease  Father    Breast cancer Paternal Aunt 53    Review of Systems: Constitutional: Doesn't report fevers, chills or abnormal weight loss Eyes: Doesn't report blurriness of vision Ears, nose, mouth, throat, and face: Doesn't report sore throat Respiratory: Doesn't report cough, dyspnea or wheezes Cardiovascular: Doesn't report palpitation, chest discomfort  Gastrointestinal:  Doesn't report nausea, constipation, diarrhea GU: Doesn't report incontinence Skin: Doesn't report skin rashes Neurological: Per HPI Musculoskeletal: Doesn't report joint pain Behavioral/Psych: Doesn't report anxiety  Physical Exam: Vitals:   03/31/21 0948  BP: (!) 112/58  Pulse: 91  Temp: (!) 97.3 F (36.3 C)   KPS: 60. General: Alert, cooperative, pleasant, in no acute distress Head: Normal EENT: No conjunctival injection or scleral icterus.  Lungs: Resp effort normal Cardiac: Regular rate Abdomen: Non-distended abdomen Skin: No rashes cyanosis or petechiae. Extremities: No clubbing or edema  Neurologic Exam: Mental Status: Awake, alert, attentive to examiner. Oriented to self and environment. Language is fluent with intact comprehension.  Cranial Nerves: Visual acuity is grossly normal. Visual fields are full. Extra-ocular movements intact. No ptosis. Face is symmetric Motor: Tone and bulk are normal. Marked weakness with hip flexion bilaterally, right worse than left. Otherwise strength is normal throughout. Reflexes are absent/trace, no pathologic reflexes present.  Sensory: Stocking neuropathic changes Gait: Non ambulatory   Labs: I have reviewed the data as listed    Component Value Date/Time   NA 134 (L) 03/24/2021 1229   NA 137 02/13/2021 0920   NA  141 08/15/2012 1037   K 3.4 (L) 03/24/2021 1229   K 4.3 08/15/2012 1037   CL 96 (L) 03/24/2021 1229   CL 103 08/15/2012 1037   CO2 26 03/24/2021 1229   CO2 29 08/15/2012 1037   GLUCOSE 126 (H) 03/24/2021 1229   GLUCOSE 113 (H) 08/15/2012 1037   BUN 8 03/24/2021 1229   BUN 7 (L) 02/13/2021 0920   BUN 18 08/15/2012 1037   CREATININE 0.46 03/24/2021 1229   CREATININE 0.65 08/15/2012 1037   CALCIUM 9.7 03/24/2021 1229   CALCIUM 9.3 08/15/2012 1037   PROT 7.5 02/13/2021 0920   PROT 7.4 08/15/2012 1037   ALBUMIN 5.0 (H) 02/13/2021 0920   ALBUMIN 4.1 08/15/2012 1037   AST 25 02/13/2021 0920   AST 18 08/15/2012 1037   ALT 18 02/13/2021 0920   ALT 26 08/15/2012 1037   ALKPHOS 129 (H) 02/13/2021 0920   ALKPHOS 71 08/15/2012 1037   BILITOT 1.2 02/13/2021 0920   BILITOT 0.7 08/15/2012 1037   GFRNONAA >60 03/24/2021 1229   GFRNONAA >60 08/15/2012 1037   GFRAA >60 03/14/2020 0825   GFRAA >60 08/15/2012 1037   Lab Results  Component Value Date   WBC 7.3 03/24/2021   NEUTROABS 5.2 02/13/2021   HGB 15.2 (H) 03/24/2021   HCT 41.1 03/24/2021   MCV 97.9 03/24/2021   PLT 163 03/24/2021    Imaging:  CT HEAD WO CONTRAST (5MM)  Result Date: 03/24/2021 CLINICAL DATA:  Right lower extremity weakness. EXAM: CT HEAD WITHOUT CONTRAST TECHNIQUE: Contiguous axial images were obtained from the base of the skull through the vertex without intravenous contrast. COMPARISON:  None. FINDINGS: Brain: No evidence of acute infarction, hemorrhage, hydrocephalus, extra-axial collection or mass lesion/mass effect. Vascular: No hyperdense vessel or unexpected calcification. Skull: Normal. Negative for fracture or focal lesion. Sinuses/Orbits: No acute finding. Other: None. IMPRESSION: No acute intracranial abnormality seen. Electronically Signed   By: Marijo Conception M.D.   On: 03/24/2021 15:01  Assessment/Plan Right leg weakness - Plan: CK, total, Aldolase, TSH, MR LUMBAR SPINE W WO CONTRAST, Lactate  dehydrogenase (LDH), CMP ([place] only)  Unspecified mononeuropathy of bilateral lower limbs  - Plan: TSH  Bilateral leg weakness  Maria Davies presents with a clinical syndrome, subacute and asymmetric hip girdle weakness, localizing to either nerve root(s) or hip girdle muscles.   Given pain component and rapid onset, we recommended obtaining MRI Lumbar spine, with and w/o contrast, for detailed examination of lumbar roots.    In addition, we will screen for myopathies with CK, aldolase, LDH, CMP, and TSH in serum today.  Will consider EMG depending on results from bloodwork and MRI study.  Leptomeningeal disease should also be considered, although the presentation is not typical.       We spent twenty additional minutes teaching regarding the natural history, biology, and historical experience in the treatment of neurologic complications of cancer.   We appreciate the opportunity to participate in the care of Maria Davies.  We will follow up with her via phone in 1-2 weeks to review MRI and serum results, plan next steps.  All questions were answered. The patient knows to call the clinic with any problems, questions or concerns. No barriers to learning were detected.  The total time spent in the encounter was 40 minutes and more than 50% was on counseling and review of test results   Ventura Sellers, MD Medical Director of Neuro-Oncology [facility] [place] 03/31/21 10:24 AM

## 2021-04-02 LAB — ALDOLASE: Aldolase: 6.6 U/L (ref 3.3–10.3)

## 2021-04-05 ENCOUNTER — Other Ambulatory Visit: Payer: Self-pay

## 2021-04-05 ENCOUNTER — Ambulatory Visit (HOSPITAL_COMMUNITY)
Admission: RE | Admit: 2021-04-05 | Discharge: 2021-04-05 | Disposition: A | Discharge status: Home / Self Care | Payer: Medicare Other | Source: Ambulatory Visit | Attending: Student | Admitting: Student

## 2021-04-05 ENCOUNTER — Ambulatory Visit (HOSPITAL_COMMUNITY)
Admission: RE | Admit: 2021-04-05 | Discharge: 2021-04-05 | Disposition: A | Discharge status: Home / Self Care | Payer: Medicare Other | Source: Ambulatory Visit | Attending: Internal Medicine | Admitting: Internal Medicine

## 2021-04-05 DIAGNOSIS — M545 Low back pain, unspecified: Secondary | ICD-10-CM | POA: Diagnosis not present

## 2021-04-05 DIAGNOSIS — R29898 Other symptoms and signs involving the musculoskeletal system: Secondary | ICD-10-CM | POA: Insufficient documentation

## 2021-04-05 DIAGNOSIS — Z950 Presence of cardiac pacemaker: Secondary | ICD-10-CM | POA: Insufficient documentation

## 2021-04-05 IMAGING — MR MR LUMBAR SPINE WO/W CM
4 of 7 series · 19 of 48 positions shown · IV contrast (20    MULTI)
Comparison: None.

CLINICAL DATA: Brain/CNS neoplasm, surveillance

EXAM:
MRI LUMBAR SPINE WITHOUT AND WITH CONTRAST
TECHNIQUE: Multiplanar and multiecho pulse sequences of the lumbar spine were
obtained without and with intravenous contrast.
CONTRAST:  7mL GADAVIST GADOBUTROL 1 MMOL/ML IV SOLN

[Series 3: T2 · sagittal · 4.0mm · 0.55mm/px · 5 of 14 slices shown (1 of 2)]
[im 1/14]
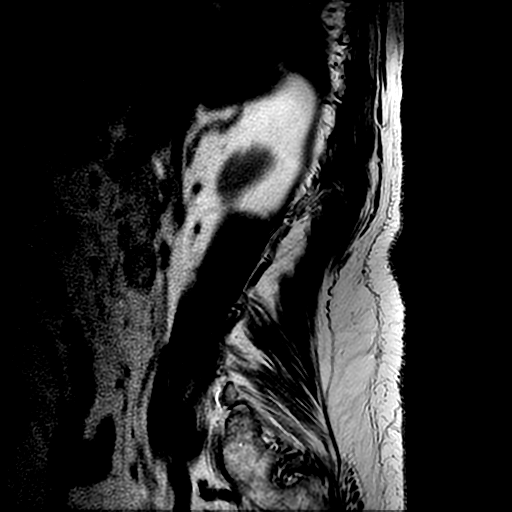
[im 4/14]
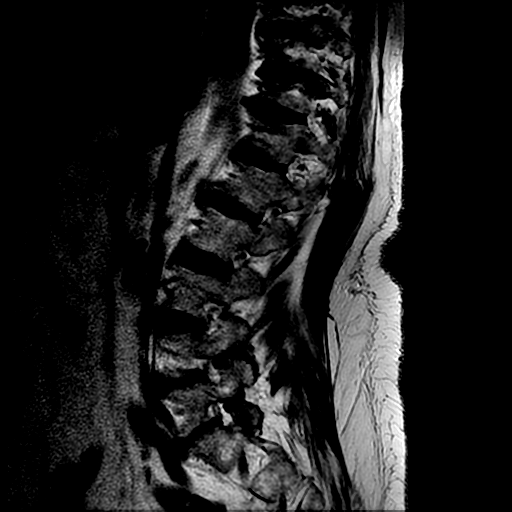
[im 7/14]
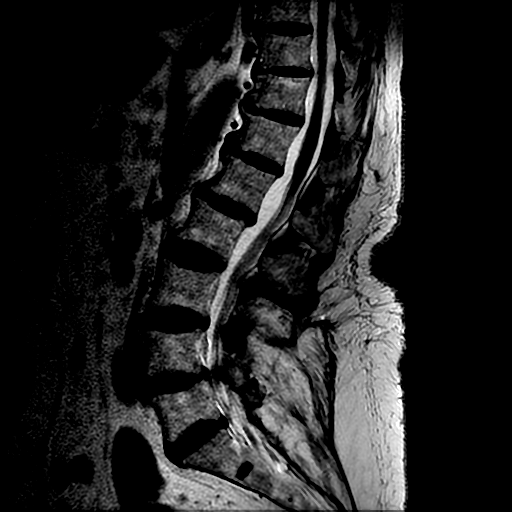
[im 10/14]
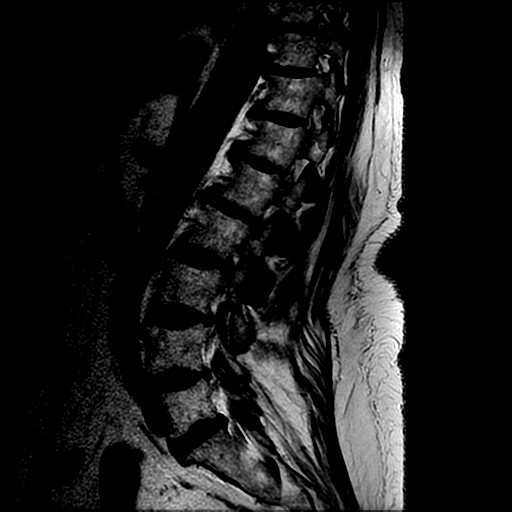
[im 14/14]
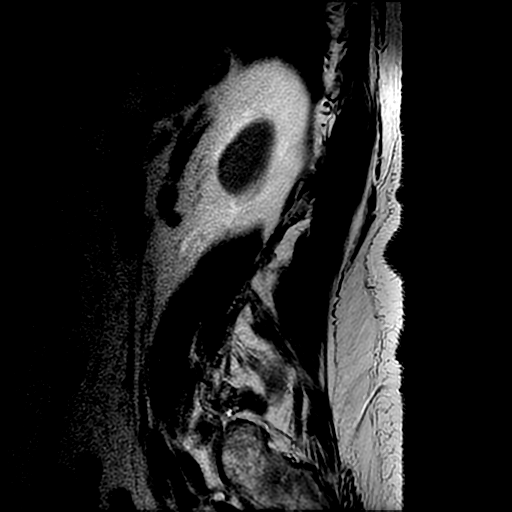

[Series 4: T1 · sagittal · 4.0mm · 0.55mm/px · 3 of 14 slices shown (1 of 2)]
[im 1/14]
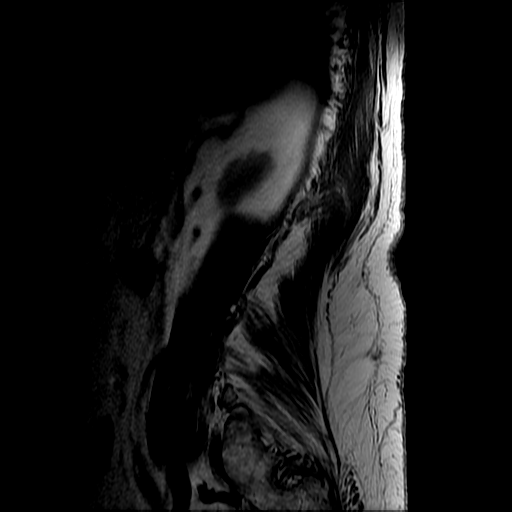
[im 7/14]
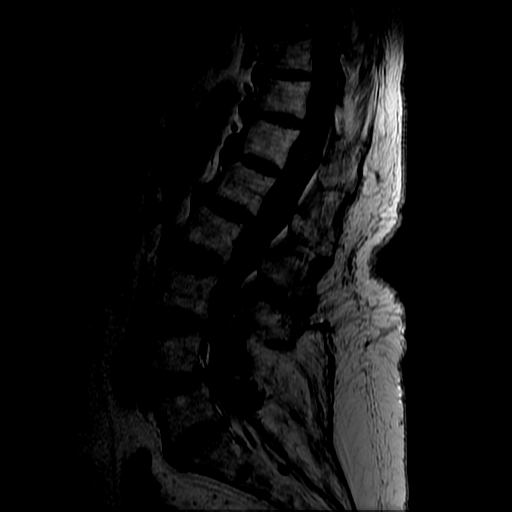
[im 14/14]
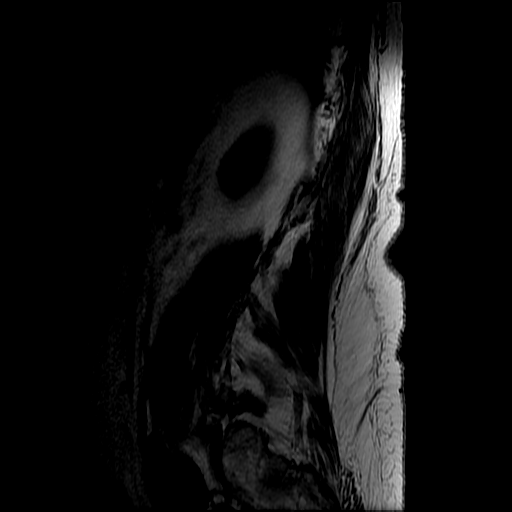

[Series 6: T2 · axial · 4.0mm · 0.39mm/px · z∈[-73,+117]mm · 8 of 32 slices shown (2 of 2)]
[im 1/32]
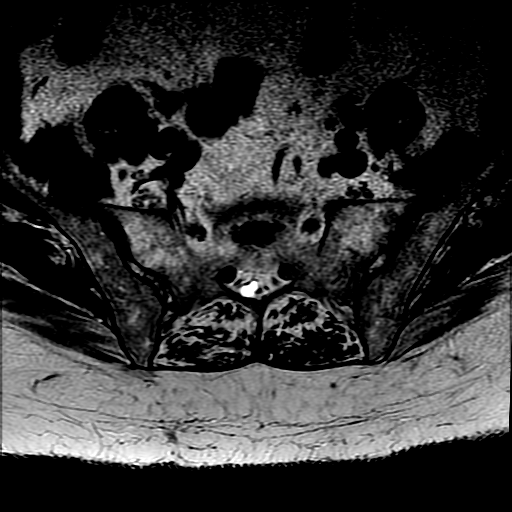
[im 4/32]
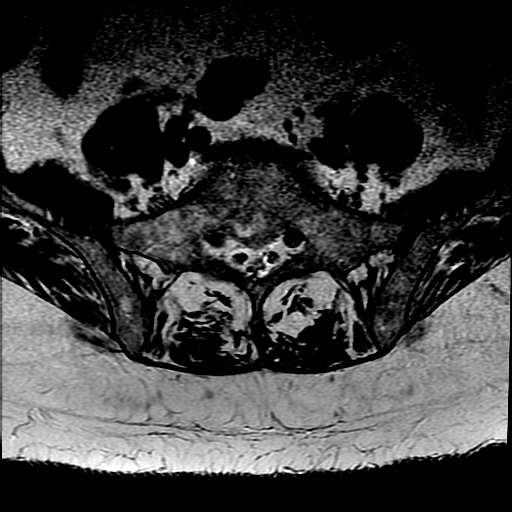
[im 11/32]
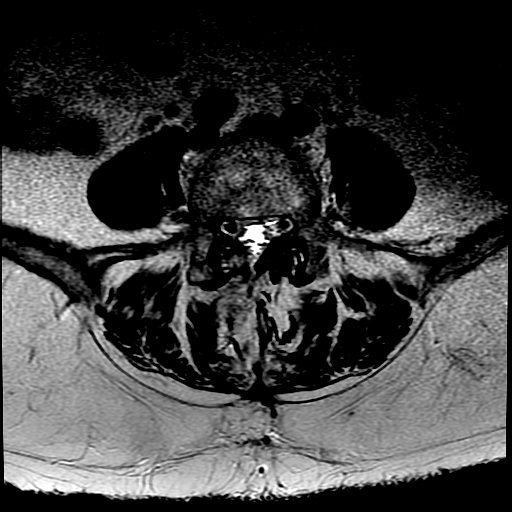
[im 14/32]
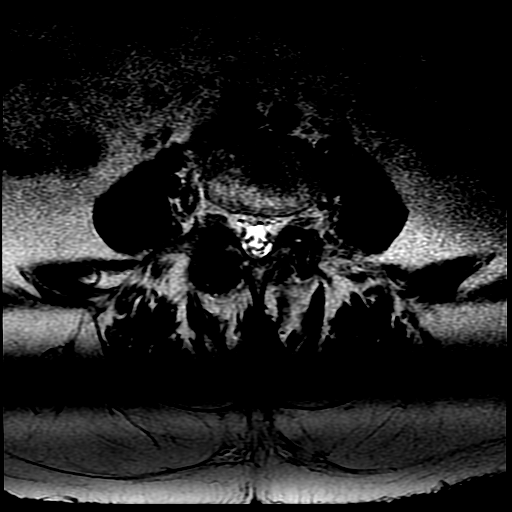
[im 18/32]
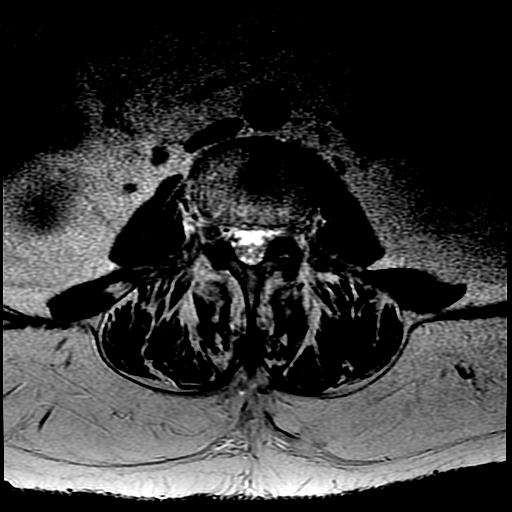
[im 21/32]
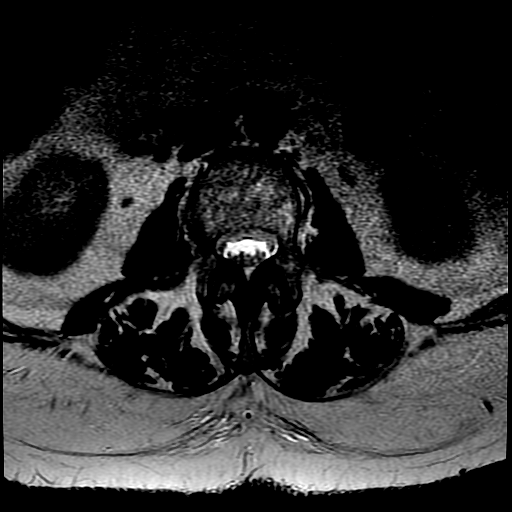
[im 28/32]
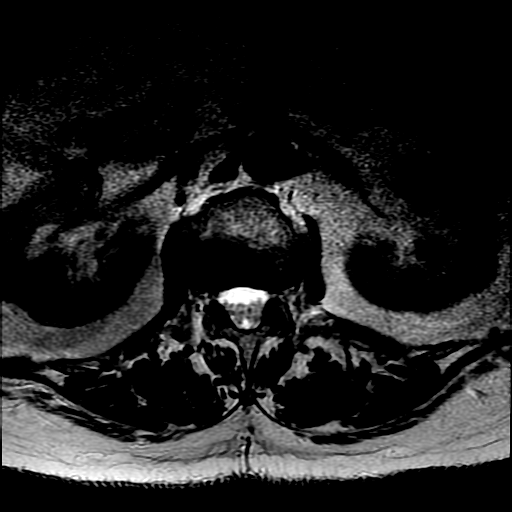
[im 32/32]
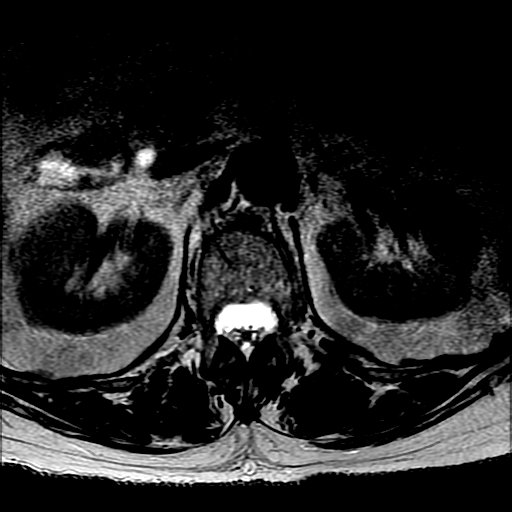

[Series 7: T1 · axial · 4.0mm · 0.39mm/px · z∈[-59,+97]mm · 3 of 32 slices shown (2 of 2)]
[im 4/32]
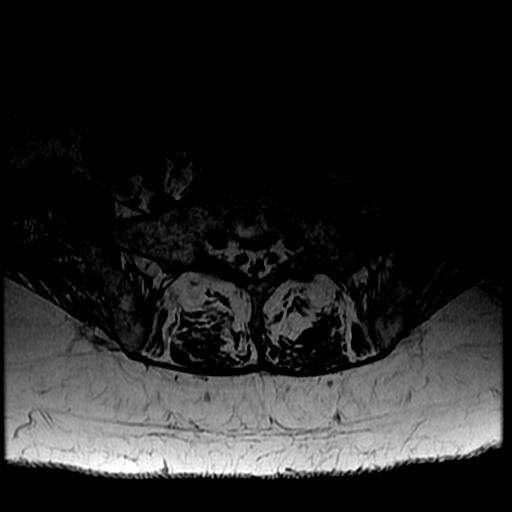
[im 18/32]
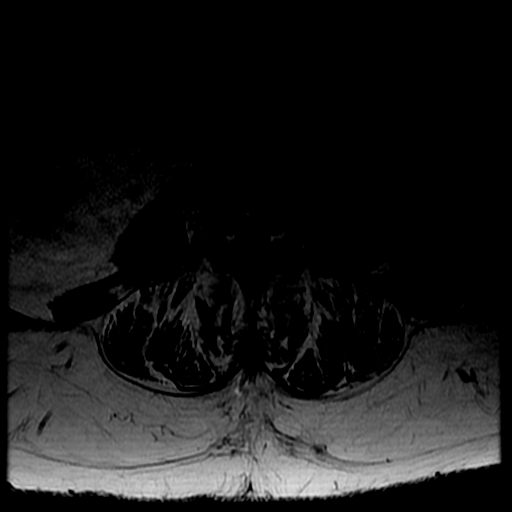
[im 28/32]
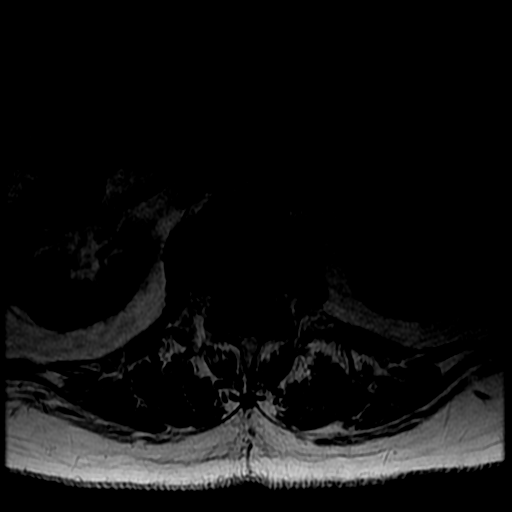

[19 of 48 positions shown; findings below may reference images not displayed]

FINDINGS: Segmentation:  Standard.

Alignment: Grade 1 retrolisthesis at L1-2. Grade 1 anterolisthesis
at L4-5.

Vertebrae:  No fracture, evidence of discitis, or bone lesion.

Conus medullaris and cauda equina: Conus extends to the L1 level.
Conus and cauda equina appear normal.

Paraspinal and other soft tissues: Negative.

Disc levels:

T12-L1: Unremarkable.

L1-L2: Small disc bulge. No spinal canal stenosis. No neural
foraminal stenosis.

L2-L3: Disc desiccation without herniation. No spinal canal
stenosis. No neural foraminal stenosis.

L3-L4: Small disc bulge and mild facet hypertrophy. No spinal canal
stenosis. Mild right neural foraminal stenosis.

L4-L5: Severe facet hypertrophy and small disc bulge. No spinal
canal stenosis. Mild right neural foraminal stenosis.

L5-S1: Severe facet hypertrophy with small disc bulge. Prior right
laminectomy. No spinal canal stenosis. Mild left neural foraminal
stenosis.

Visualized sacrum: Normal.
IMPRESSION: 1. Severe facet arthrosis at L4-5 and L5-S1, which may serve as a
source of local low back pain.
2. Mild neural foraminal stenosis at right L3-4, right L4-5 and left
L5-S1.
3. No spinal canal stenosis.

## 2021-04-05 IMAGING — DX DG CHEST 1V
1 series · 1 of 1 positions shown · non-contrast
Comparison: [DATE] and CT chest [DATE].

CLINICAL DATA: Pre MRI for pacemaker.

EXAM:
CHEST  1 VIEW

[chest ap]
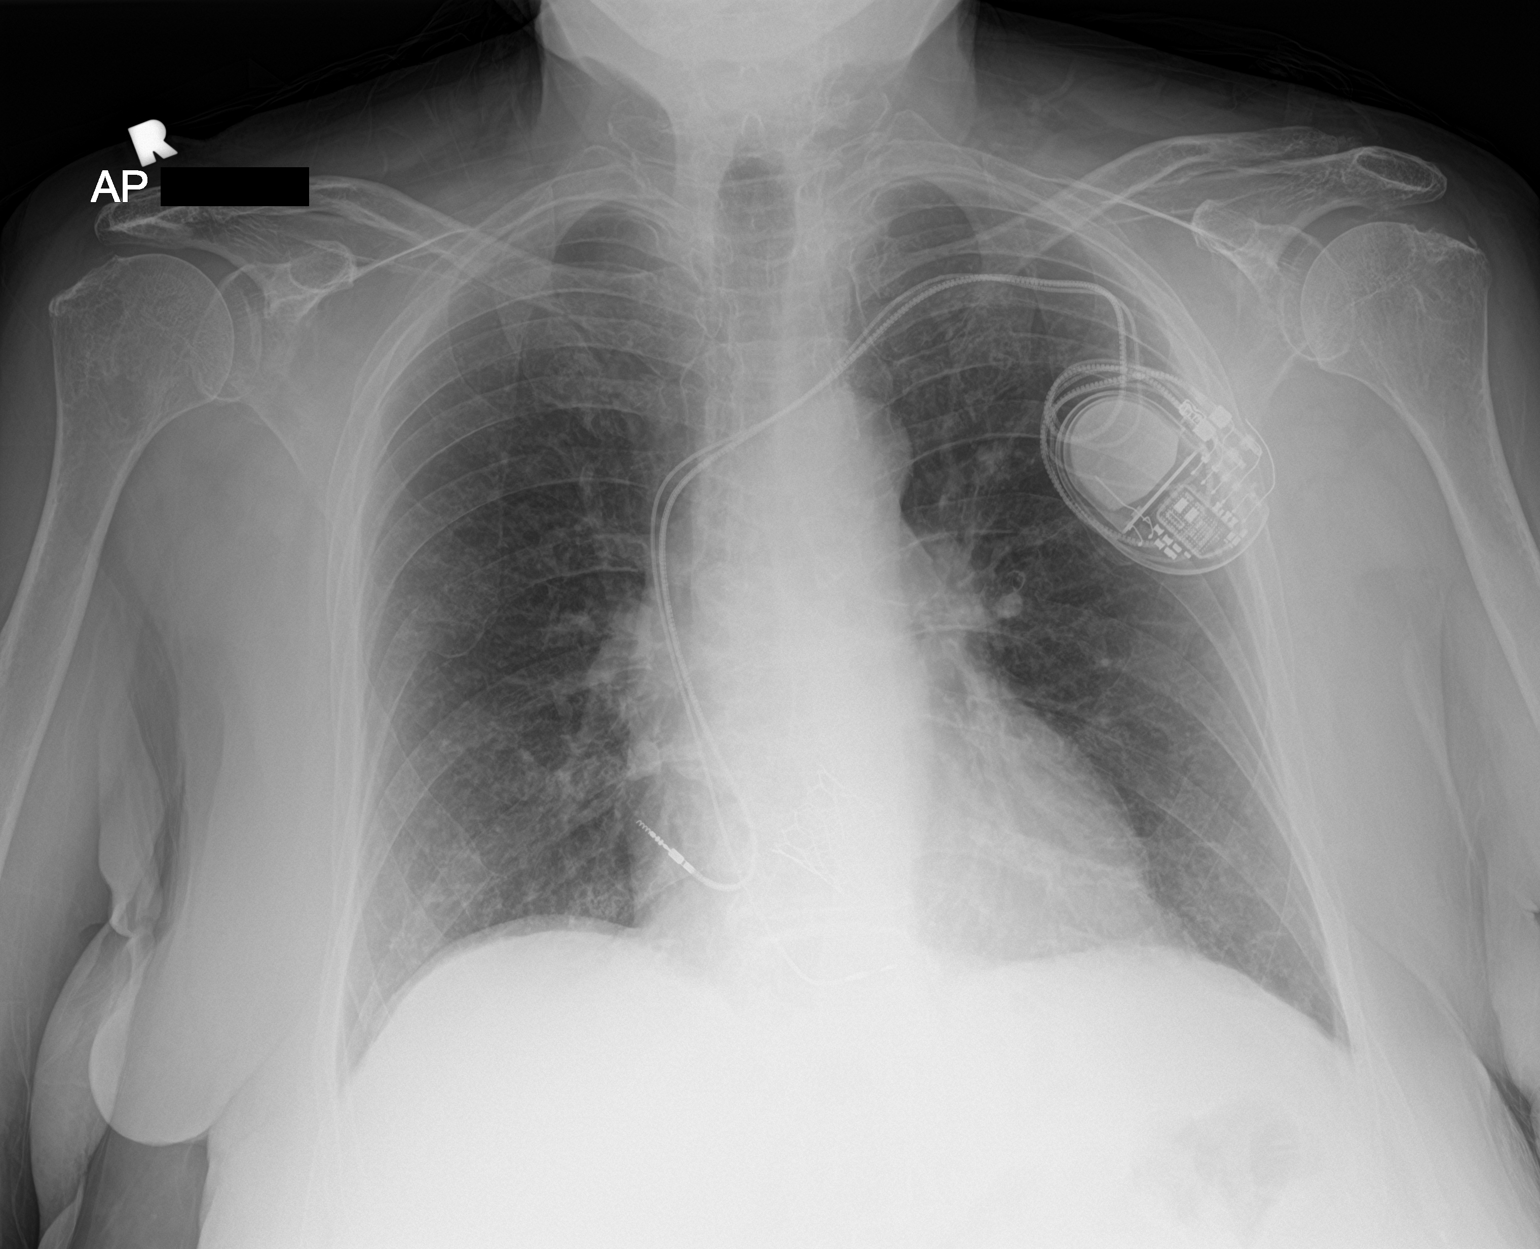

[1 of 1 positions shown; findings below may reference images not displayed]

FINDINGS: Left-sided dual lead pacemaker with lead tips projecting over the
right atrium and right ventricle. Heart size stable. Lungs are
clear. No pleural fluid.
IMPRESSION: 1. Dual lead pacemaker in place.
2. No acute findings.

## 2021-04-05 MED ORDER — GADOBUTROL 1 MMOL/ML IV SOLN
7.0000 mL | Freq: Once | INTRAVENOUS | Status: AC | PRN
  Administered 2021-04-05: 7 mL via INTRAVENOUS

## 2021-04-05 NOTE — Progress Notes (Signed)
Per order, Changed device settings for MRI to DOO at 95 bpm   Tachy-therapies to off if applicable.   Will program device back to pre-MRI settings after completion of exam

## 2021-04-05 NOTE — Progress Notes (Signed)
Informed of MRI for today.   Device system confirmed to be MRI conditional, with implant date > 6 weeks ago, and no evidence of abandoned or epicardial leads in review of most recent CXR Interrogation from today reviewed, pt is currently AS-VP at ~100 bpm  Per RN, current HR is hovering 95-100  Change device settings for MRI to DOO at 95 bpm  Tachy-therapies to off if applicable.  Program device back to pre-MRI settings after completion of exam.  Annamaria Helling  04/05/2021 12:32 PM

## 2021-04-06 ENCOUNTER — Telehealth: Payer: Self-pay | Admitting: *Deleted

## 2021-04-06 NOTE — Telephone Encounter (Signed)
Patients daughter called to see what the next steps are.  Completed lab tests and MRI and those results will dictate the next steps.  MRI findings have not been completed by Radiologist at this time.  Per Dr Mickeal Skinner will wait for those and then make his determination.  Follow up Monday 04/10/2021 to see if those have resulted and decision is made.

## 2021-04-10 ENCOUNTER — Telehealth: Payer: Self-pay

## 2021-04-10 ENCOUNTER — Other Ambulatory Visit: Payer: Self-pay | Admitting: Nurse Practitioner

## 2021-04-10 DIAGNOSIS — M47816 Spondylosis without myelopathy or radiculopathy, lumbar region: Secondary | ICD-10-CM | POA: Insufficient documentation

## 2021-04-10 DIAGNOSIS — M4807 Spinal stenosis, lumbosacral region: Secondary | ICD-10-CM

## 2021-04-10 NOTE — Telephone Encounter (Signed)
Dr. Mickeal Skinner contacted patient with MRI results and he recommends patient be referred to neurosurgery.  Scheryl Darter, NP entered referral to Dr. Izora Ribas.  Records have been faxed to [facility].

## 2021-04-10 NOTE — Progress Notes (Signed)
MRI shows advanced arthropathy and neuroforaminal stenosis which is probably corresponding to her symptoms.  Results reviewed by Dr. Mickeal Skinner who recommends evaluation with neurosurgery.  Patient agreeable.  We will send referral.

## 2021-04-12 ENCOUNTER — Encounter: Payer: Self-pay | Admitting: Oncology

## 2021-04-14 ENCOUNTER — Other Ambulatory Visit (HOSPITAL_COMMUNITY): Payer: Self-pay | Admitting: Neurosurgery

## 2021-04-14 DIAGNOSIS — R531 Weakness: Secondary | ICD-10-CM | POA: Diagnosis not present

## 2021-04-17 ENCOUNTER — Ambulatory Visit (HOSPITAL_COMMUNITY)
Admission: RE | Admit: 2021-04-17 | Discharge: 2021-04-17 | Disposition: A | Discharge status: Home / Self Care | Payer: Medicare Other | Source: Ambulatory Visit | Attending: Neurosurgery | Admitting: Neurosurgery

## 2021-04-17 ENCOUNTER — Other Ambulatory Visit: Payer: Self-pay

## 2021-04-17 DIAGNOSIS — M5124 Other intervertebral disc displacement, thoracic region: Secondary | ICD-10-CM | POA: Diagnosis not present

## 2021-04-17 DIAGNOSIS — Z85038 Personal history of other malignant neoplasm of large intestine: Secondary | ICD-10-CM | POA: Diagnosis not present

## 2021-04-17 DIAGNOSIS — R531 Weakness: Secondary | ICD-10-CM | POA: Insufficient documentation

## 2021-04-17 DIAGNOSIS — Z853 Personal history of malignant neoplasm of breast: Secondary | ICD-10-CM | POA: Diagnosis not present

## 2021-04-17 IMAGING — MR MR THORACIC SPINE W/O CM
4 of 6 series · 19 of 48 positions shown · non-contrast
Comparison: CT chest [DATE]

CLINICAL DATA: Weakness. Back pain. Recent fall. History of breast
cancer and colon cancer.

EXAM:
MRI THORACIC SPINE WITHOUT CONTRAST
TECHNIQUE: Multiplanar, multisequence MR imaging of the thoracic spine was
performed. No intravenous contrast was administered.

[Series 2: counting loc · sagittal · 4.0mm · 0.90mm/px · 3 of 11 slices shown]
[im 1/11]
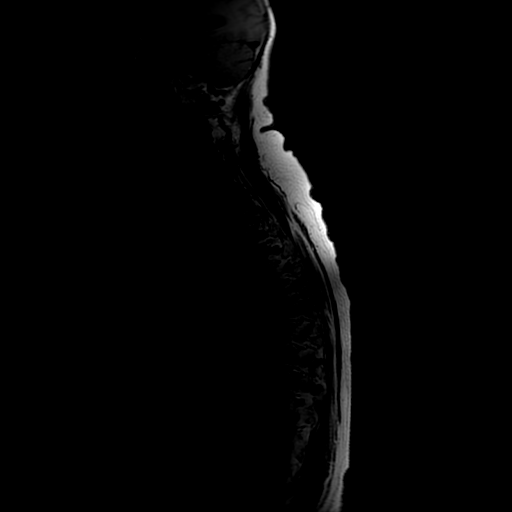
[im 6/11]
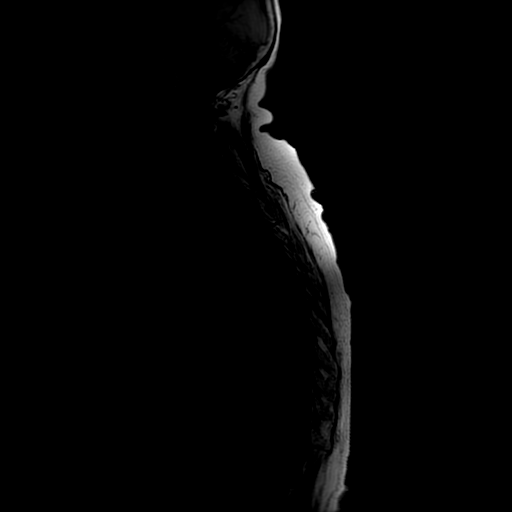
[im 11/11]
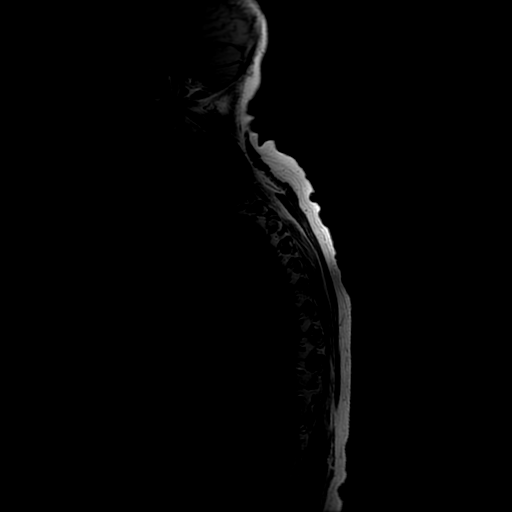

[Series 4: T2 · sagittal · 3.0mm · 0.62mm/px · 5 of 14 slices shown (1 of 2)]
[im 1/14]
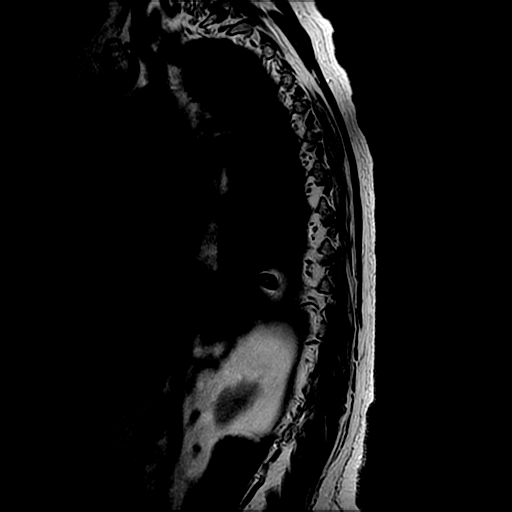
[im 4/14]
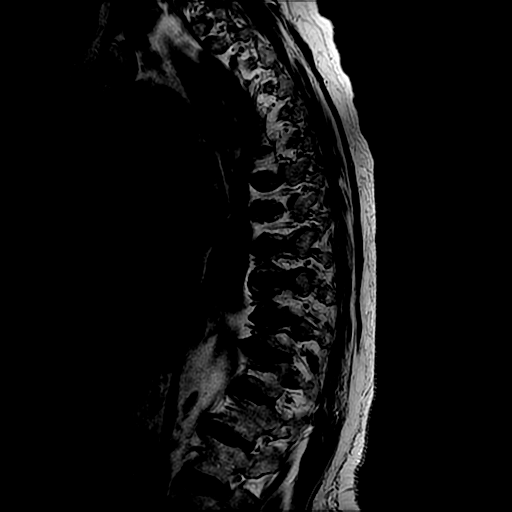
[im 7/14]
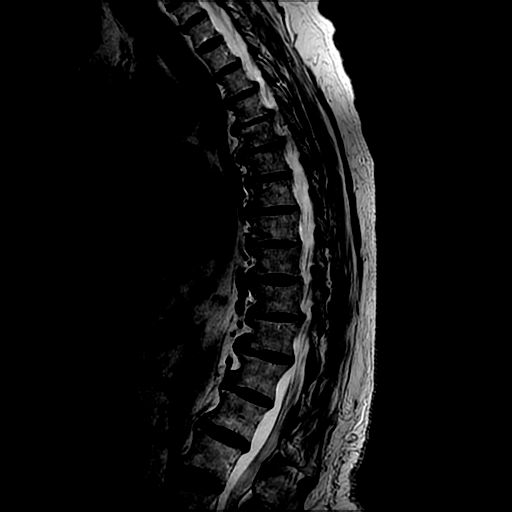
[im 10/14]
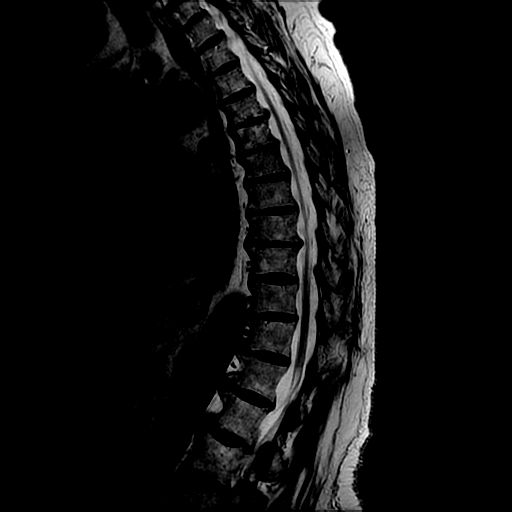
[im 14/14]
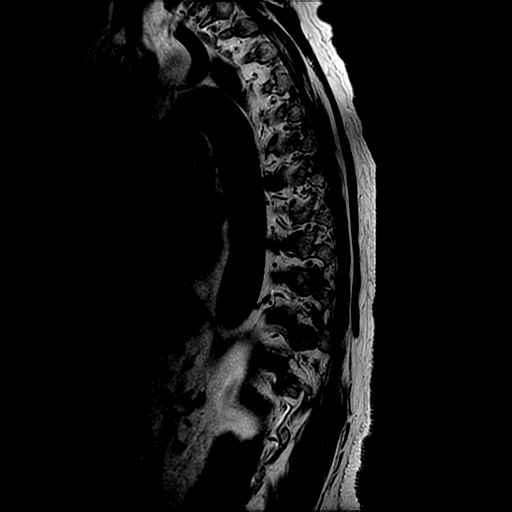

[Series 5: T1 · sagittal · 3.0mm · 0.62mm/px · 3 of 14 slices shown]
[im 1/14]
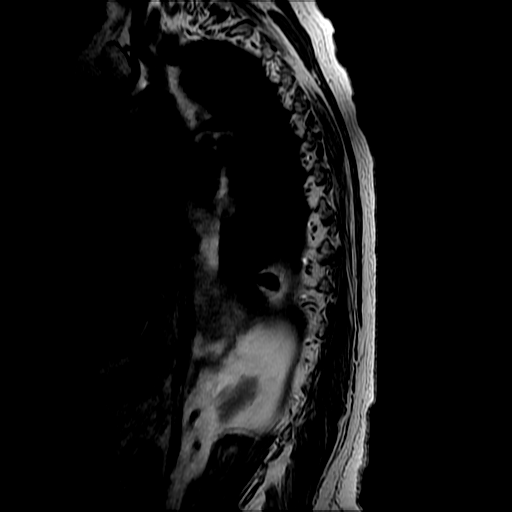
[im 7/14]
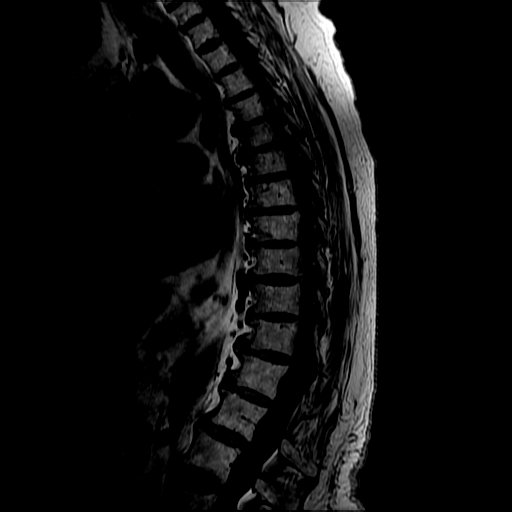
[im 14/14]
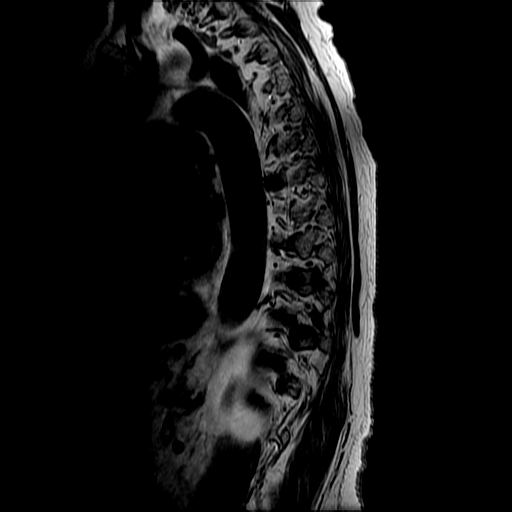

[Series 7: T2 · axial · 4.0mm · 0.43mm/px · z∈[-94,+64]mm · 8 of 39 slices shown (2 of 2)]
[im 1/39]
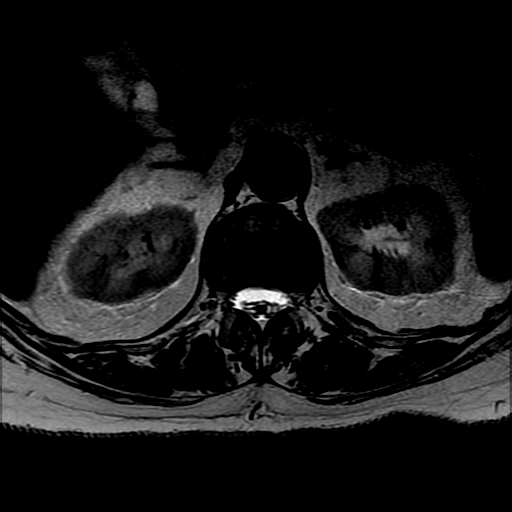
[im 6/39]
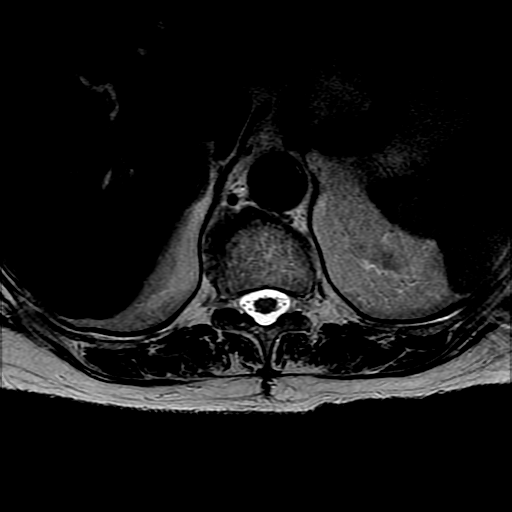
[im 12/39]
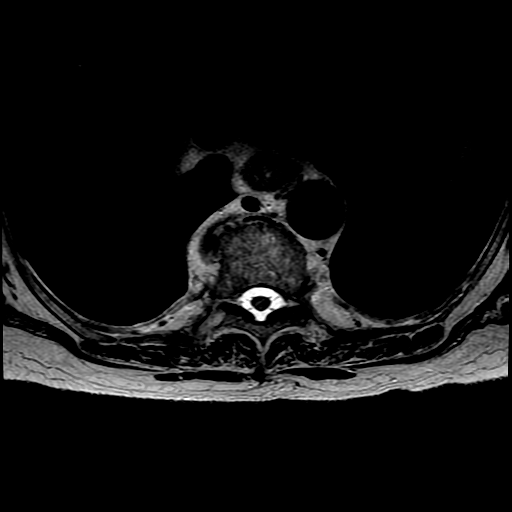
[im 18/39]
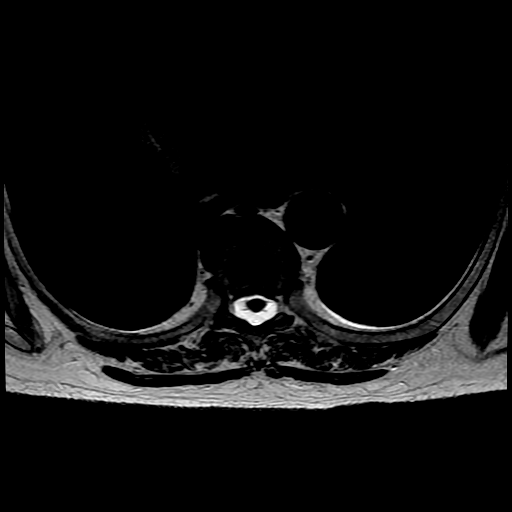
[im 21/39]
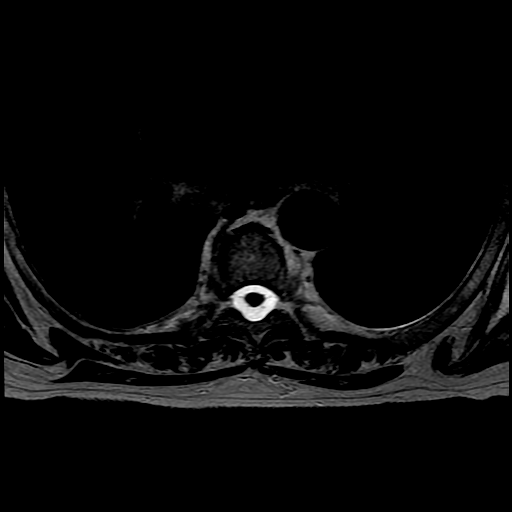
[im 27/39]
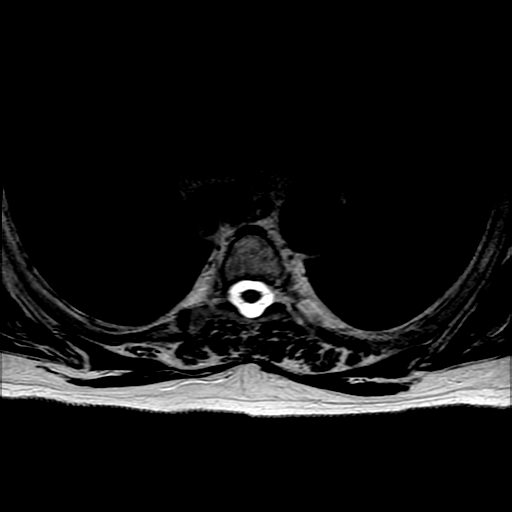
[im 33/39]
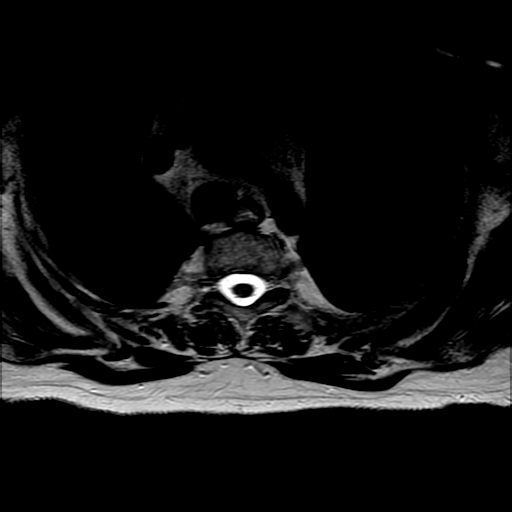
[im 39/39]
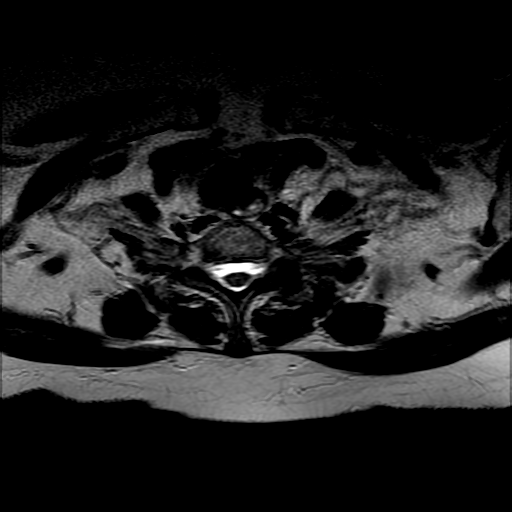

[19 of 48 positions shown; findings below may reference images not displayed]

FINDINGS: Alignment: Trace anterolisthesis of C7 on T1 and trace
retrolisthesis of L1 on L2.

Vertebrae: No fracture or suspicious marrow lesion. Multilevel
degenerative endplate changes including mild edema most notable at
T4-5.

Cord:  Normal signal and morphology.

Paraspinal and other soft tissues: Unremarkable.

Disc levels:

Capacious thoracic spinal canal. Disc bulging and scattered small
disc protrusions throughout the thoracic spine including left
paracentral disc osteophyte complex at T4-5 and T8-9 and a shallow
right paracentral disc protrusion at T7-8. No significant spinal
stenosis or spinal cord mass effect. Moderate to severe multilevel
facet arthrosis in the lower thoracic spine. No compressive neural
foraminal stenosis.
IMPRESSION: Thoracic disc and facet degeneration without significant stenosis.

## 2021-04-17 NOTE — Progress Notes (Signed)
Informed of MRI for today.   Device system confirmed to be MRI conditional, with implant date > 6 weeks ago, and no evidence of abandoned or epicardial leads in review of most recent CXR Interrogation from today reviewed, pt is currently AS-VP at ~100-105 bpm Change device settings for MRI to DOO at 95 bpm  Tachy-therapies to off if applicable.  Program device back to pre-MRI settings after completion of exam.  Maria Davies  04/17/2021 12:21 PM

## 2021-04-20 ENCOUNTER — Other Ambulatory Visit: Payer: Self-pay | Admitting: Neurosurgery

## 2021-04-20 ENCOUNTER — Other Ambulatory Visit (HOSPITAL_COMMUNITY): Payer: Self-pay | Admitting: Neurosurgery

## 2021-04-20 DIAGNOSIS — Z953 Presence of xenogenic heart valve: Secondary | ICD-10-CM | POA: Diagnosis not present

## 2021-04-20 DIAGNOSIS — R269 Unspecified abnormalities of gait and mobility: Secondary | ICD-10-CM

## 2021-04-20 DIAGNOSIS — R531 Weakness: Secondary | ICD-10-CM | POA: Diagnosis not present

## 2021-04-20 DIAGNOSIS — G610 Guillain-Barre syndrome: Secondary | ICD-10-CM | POA: Diagnosis not present

## 2021-04-20 DIAGNOSIS — E0500 Thyrotoxicosis with diffuse goiter without thyrotoxic crisis or storm: Secondary | ICD-10-CM | POA: Diagnosis not present

## 2021-04-20 DIAGNOSIS — G620 Drug-induced polyneuropathy: Secondary | ICD-10-CM | POA: Diagnosis not present

## 2021-04-20 DIAGNOSIS — I442 Atrioventricular block, complete: Secondary | ICD-10-CM | POA: Diagnosis not present

## 2021-04-20 DIAGNOSIS — I10 Essential (primary) hypertension: Secondary | ICD-10-CM | POA: Diagnosis not present

## 2021-04-20 DIAGNOSIS — Z85038 Personal history of other malignant neoplasm of large intestine: Secondary | ICD-10-CM | POA: Diagnosis not present

## 2021-04-20 DIAGNOSIS — Z9221 Personal history of antineoplastic chemotherapy: Secondary | ICD-10-CM | POA: Diagnosis not present

## 2021-04-21 DIAGNOSIS — R29898 Other symptoms and signs involving the musculoskeletal system: Secondary | ICD-10-CM | POA: Diagnosis not present

## 2021-04-21 DIAGNOSIS — Z7982 Long term (current) use of aspirin: Secondary | ICD-10-CM | POA: Diagnosis not present

## 2021-04-21 DIAGNOSIS — Z85038 Personal history of other malignant neoplasm of large intestine: Secondary | ICD-10-CM | POA: Diagnosis not present

## 2021-04-21 DIAGNOSIS — I442 Atrioventricular block, complete: Secondary | ICD-10-CM | POA: Diagnosis present

## 2021-04-21 DIAGNOSIS — Z980 Intestinal bypass and anastomosis status: Secondary | ICD-10-CM | POA: Diagnosis not present

## 2021-04-21 DIAGNOSIS — K588 Other irritable bowel syndrome: Secondary | ICD-10-CM | POA: Diagnosis not present

## 2021-04-21 DIAGNOSIS — E785 Hyperlipidemia, unspecified: Secondary | ICD-10-CM | POA: Diagnosis present

## 2021-04-21 DIAGNOSIS — Z953 Presence of xenogenic heart valve: Secondary | ICD-10-CM | POA: Diagnosis not present

## 2021-04-21 DIAGNOSIS — D120 Benign neoplasm of cecum: Secondary | ICD-10-CM | POA: Diagnosis not present

## 2021-04-21 DIAGNOSIS — E569 Vitamin deficiency, unspecified: Secondary | ICD-10-CM | POA: Diagnosis not present

## 2021-04-21 DIAGNOSIS — E039 Hypothyroidism, unspecified: Secondary | ICD-10-CM | POA: Diagnosis not present

## 2021-04-21 DIAGNOSIS — Z9049 Acquired absence of other specified parts of digestive tract: Secondary | ICD-10-CM | POA: Diagnosis not present

## 2021-04-21 DIAGNOSIS — G610 Guillain-Barre syndrome: Secondary | ICD-10-CM | POA: Diagnosis present

## 2021-04-21 DIAGNOSIS — Z9011 Acquired absence of right breast and nipple: Secondary | ICD-10-CM | POA: Diagnosis not present

## 2021-04-21 DIAGNOSIS — Z79899 Other long term (current) drug therapy: Secondary | ICD-10-CM | POA: Diagnosis not present

## 2021-04-21 DIAGNOSIS — T451X5A Adverse effect of antineoplastic and immunosuppressive drugs, initial encounter: Secondary | ICD-10-CM | POA: Diagnosis present

## 2021-04-21 DIAGNOSIS — Z9221 Personal history of antineoplastic chemotherapy: Secondary | ICD-10-CM | POA: Diagnosis not present

## 2021-04-21 DIAGNOSIS — R278 Other lack of coordination: Secondary | ICD-10-CM | POA: Diagnosis not present

## 2021-04-21 DIAGNOSIS — Z9841 Cataract extraction status, right eye: Secondary | ICD-10-CM | POA: Diagnosis not present

## 2021-04-21 DIAGNOSIS — G620 Drug-induced polyneuropathy: Secondary | ICD-10-CM | POA: Diagnosis present

## 2021-04-21 DIAGNOSIS — Z950 Presence of cardiac pacemaker: Secondary | ICD-10-CM | POA: Diagnosis not present

## 2021-04-21 DIAGNOSIS — K5900 Constipation, unspecified: Secondary | ICD-10-CM | POA: Diagnosis present

## 2021-04-21 DIAGNOSIS — I2510 Atherosclerotic heart disease of native coronary artery without angina pectoris: Secondary | ICD-10-CM | POA: Diagnosis present

## 2021-04-21 DIAGNOSIS — M6259 Muscle wasting and atrophy, not elsewhere classified, multiple sites: Secondary | ICD-10-CM | POA: Diagnosis not present

## 2021-04-21 DIAGNOSIS — K649 Unspecified hemorrhoids: Secondary | ICD-10-CM | POA: Diagnosis present

## 2021-04-21 DIAGNOSIS — Z923 Personal history of irradiation: Secondary | ICD-10-CM | POA: Diagnosis not present

## 2021-04-21 DIAGNOSIS — Z961 Presence of intraocular lens: Secondary | ICD-10-CM | POA: Diagnosis present

## 2021-04-21 DIAGNOSIS — Z20822 Contact with and (suspected) exposure to covid-19: Secondary | ICD-10-CM | POA: Diagnosis present

## 2021-04-21 DIAGNOSIS — R531 Weakness: Secondary | ICD-10-CM | POA: Diagnosis not present

## 2021-04-21 DIAGNOSIS — Z9842 Cataract extraction status, left eye: Secondary | ICD-10-CM | POA: Diagnosis not present

## 2021-04-21 DIAGNOSIS — Z853 Personal history of malignant neoplasm of breast: Secondary | ICD-10-CM | POA: Diagnosis not present

## 2021-04-21 DIAGNOSIS — I10 Essential (primary) hypertension: Secondary | ICD-10-CM | POA: Diagnosis present

## 2021-04-21 DIAGNOSIS — E0500 Thyrotoxicosis with diffuse goiter without thyrotoxic crisis or storm: Secondary | ICD-10-CM | POA: Diagnosis present

## 2021-04-21 DIAGNOSIS — I350 Nonrheumatic aortic (valve) stenosis: Secondary | ICD-10-CM | POA: Diagnosis not present

## 2021-04-21 DIAGNOSIS — Z736 Limitation of activities due to disability: Secondary | ICD-10-CM | POA: Diagnosis not present

## 2021-04-25 DIAGNOSIS — I350 Nonrheumatic aortic (valve) stenosis: Secondary | ICD-10-CM | POA: Diagnosis not present

## 2021-04-25 DIAGNOSIS — C189 Malignant neoplasm of colon, unspecified: Secondary | ICD-10-CM | POA: Diagnosis not present

## 2021-04-25 DIAGNOSIS — G610 Guillain-Barre syndrome: Secondary | ICD-10-CM | POA: Diagnosis not present

## 2021-04-25 DIAGNOSIS — E039 Hypothyroidism, unspecified: Secondary | ICD-10-CM | POA: Diagnosis not present

## 2021-04-25 DIAGNOSIS — M6281 Muscle weakness (generalized): Secondary | ICD-10-CM | POA: Diagnosis not present

## 2021-04-25 DIAGNOSIS — R278 Other lack of coordination: Secondary | ICD-10-CM | POA: Diagnosis not present

## 2021-04-25 DIAGNOSIS — Z736 Limitation of activities due to disability: Secondary | ICD-10-CM | POA: Diagnosis not present

## 2021-04-25 DIAGNOSIS — E569 Vitamin deficiency, unspecified: Secondary | ICD-10-CM | POA: Diagnosis not present

## 2021-04-25 DIAGNOSIS — M6259 Muscle wasting and atrophy, not elsewhere classified, multiple sites: Secondary | ICD-10-CM | POA: Diagnosis not present

## 2021-04-25 DIAGNOSIS — D120 Benign neoplasm of cecum: Secondary | ICD-10-CM | POA: Diagnosis not present

## 2021-04-25 DIAGNOSIS — K588 Other irritable bowel syndrome: Secondary | ICD-10-CM | POA: Diagnosis not present

## 2021-04-25 DIAGNOSIS — Z950 Presence of cardiac pacemaker: Secondary | ICD-10-CM | POA: Diagnosis not present

## 2021-04-25 DIAGNOSIS — I10 Essential (primary) hypertension: Secondary | ICD-10-CM | POA: Diagnosis not present

## 2021-04-26 DIAGNOSIS — C189 Malignant neoplasm of colon, unspecified: Secondary | ICD-10-CM | POA: Diagnosis not present

## 2021-04-26 DIAGNOSIS — G610 Guillain-Barre syndrome: Secondary | ICD-10-CM | POA: Diagnosis not present

## 2021-05-02 DIAGNOSIS — M6281 Muscle weakness (generalized): Secondary | ICD-10-CM | POA: Diagnosis not present

## 2021-05-02 DIAGNOSIS — G610 Guillain-Barre syndrome: Secondary | ICD-10-CM | POA: Diagnosis not present

## 2021-05-03 DIAGNOSIS — M6281 Muscle weakness (generalized): Secondary | ICD-10-CM | POA: Diagnosis not present

## 2021-05-03 DIAGNOSIS — G610 Guillain-Barre syndrome: Secondary | ICD-10-CM | POA: Diagnosis not present

## 2021-05-04 DIAGNOSIS — G610 Guillain-Barre syndrome: Secondary | ICD-10-CM | POA: Diagnosis not present

## 2021-05-04 DIAGNOSIS — K588 Other irritable bowel syndrome: Secondary | ICD-10-CM | POA: Diagnosis not present

## 2021-05-04 DIAGNOSIS — I10 Essential (primary) hypertension: Secondary | ICD-10-CM | POA: Diagnosis not present

## 2021-05-04 DIAGNOSIS — Z85038 Personal history of other malignant neoplasm of large intestine: Secondary | ICD-10-CM | POA: Diagnosis not present

## 2021-05-04 DIAGNOSIS — Z950 Presence of cardiac pacemaker: Secondary | ICD-10-CM | POA: Diagnosis not present

## 2021-05-04 DIAGNOSIS — E569 Vitamin deficiency, unspecified: Secondary | ICD-10-CM | POA: Diagnosis not present

## 2021-05-04 DIAGNOSIS — Z853 Personal history of malignant neoplasm of breast: Secondary | ICD-10-CM | POA: Diagnosis not present

## 2021-05-04 DIAGNOSIS — E039 Hypothyroidism, unspecified: Secondary | ICD-10-CM | POA: Diagnosis not present

## 2021-05-04 DIAGNOSIS — I350 Nonrheumatic aortic (valve) stenosis: Secondary | ICD-10-CM | POA: Diagnosis not present

## 2021-05-08 ENCOUNTER — Ambulatory Visit (INDEPENDENT_AMBULATORY_CARE_PROVIDER_SITE_OTHER): Payer: Medicare Other | Admitting: Family Medicine

## 2021-05-08 ENCOUNTER — Encounter: Payer: Self-pay | Admitting: Family Medicine

## 2021-05-08 ENCOUNTER — Other Ambulatory Visit: Payer: Self-pay

## 2021-05-08 VITALS — BP 93/56 | HR 92 | Temp 97.9°F

## 2021-05-08 DIAGNOSIS — I350 Nonrheumatic aortic (valve) stenosis: Secondary | ICD-10-CM | POA: Diagnosis not present

## 2021-05-08 DIAGNOSIS — I952 Hypotension due to drugs: Secondary | ICD-10-CM | POA: Diagnosis not present

## 2021-05-08 DIAGNOSIS — K588 Other irritable bowel syndrome: Secondary | ICD-10-CM | POA: Diagnosis not present

## 2021-05-08 DIAGNOSIS — E538 Deficiency of other specified B group vitamins: Secondary | ICD-10-CM

## 2021-05-08 DIAGNOSIS — R768 Other specified abnormal immunological findings in serum: Secondary | ICD-10-CM

## 2021-05-08 DIAGNOSIS — G610 Guillain-Barre syndrome: Secondary | ICD-10-CM | POA: Diagnosis not present

## 2021-05-08 DIAGNOSIS — R29898 Other symptoms and signs involving the musculoskeletal system: Secondary | ICD-10-CM

## 2021-05-08 DIAGNOSIS — E039 Hypothyroidism, unspecified: Secondary | ICD-10-CM | POA: Diagnosis not present

## 2021-05-08 DIAGNOSIS — E569 Vitamin deficiency, unspecified: Secondary | ICD-10-CM | POA: Diagnosis not present

## 2021-05-08 DIAGNOSIS — I10 Essential (primary) hypertension: Secondary | ICD-10-CM | POA: Diagnosis not present

## 2021-05-08 MED ORDER — BENAZEPRIL HCL 20 MG PO TABS: 20.0000 mg | ORAL_TABLET | Freq: Every day | ORAL | 1 refills | Status: DC

## 2021-05-08 NOTE — Progress Notes (Signed)
BP (!) 93/56   Pulse 92   Temp 97.9 F (36.6 C)    Subjective:    Patient ID: Maria Davies, female    DOB: 09-10-1940, 80 y.o.   MRN: [id number]  HPI: Maria Davies is a 80 y.o. female  Chief Complaint  Patient presents with   Hospitalization Follow-up    Patient was admitted into hospital for weakness in legs    Transition of [facility] Follow up.   Hospital/Facility:  [facility], then [facility] D/C Physician: Mattie Marlin, PA D/C Date: 04/25/21, 05/04/21  Records Requested: 05/08/21 Records Received: 05/08/21 Records Reviewed: 05/08/21  Diagnoses on Discharge: Bilateral Leg weakness, Guillain Barre Syndrome, Weakness, Constipation  Date of interactive Contact within 48 hours of discharge: 05/08/21 Contact was through: direct  Date of 7 day or 14 day face-to-face visit: 05/08/21   within 7 days  Outpatient Encounter Medications as of 05/08/2021  Medication Sig Note   acetaminophen (TYLENOL) 325 MG tablet Take 325 mg by mouth 3 (three) times daily as needed for moderate pain or headache.    amLODipine (NORVASC) 5 MG tablet Take 1 tablet (5 mg total) by mouth daily.    aspirin EC 81 MG tablet Take 81 mg by mouth daily.    cyclobenzaprine (FLEXERIL) 10 MG tablet Take 1 tablet (10 mg total) by mouth 3 (three) times daily as needed for muscle spasms.    lidocaine-prilocaine (EMLA) cream Apply to affected area once    methimazole (TAPAZOLE) 10 MG tablet Take 10 mg by mouth daily.    methimazole (TAPAZOLE) 5 MG tablet Take 5 mg by mouth daily.    metoprolol succinate (TOPROL-XL) 50 MG 24 hr tablet Take 1 tablet (50 mg total) by mouth daily. Take with or immediately following a meal.    Multiple Vitamin (MULTI-VITAMINS) TABS Take 1 tablet by mouth daily.     Probiotic Product (PROBIOTIC ADVANCED PO) Take 1-2 capsules by mouth daily. When taking antibiotics pt will take 2 capsules instead of 1, normally just takes 1 per day otherwise    silver sulfADIAZINE  (SILVADENE) 1 % cream Apply 1 application topically daily.    Sodium Hyaluronate, oral, (HYALURONIC ACID PO) Take 1 tablet by mouth daily. 03/28/2021: Topical    vitamin B-12 (CYANOCOBALAMIN) 1000 MCG tablet Take 1,000 mcg by mouth daily.    [DISCONTINUED] benazepril (LOTENSIN) 40 MG tablet Take 1 tablet (40 mg total) by mouth daily.    benazepril (LOTENSIN) 20 MG tablet Take 1 tablet (20 mg total) by mouth daily.    [DISCONTINUED] Cholecalciferol (VITAMIN D3) 50 MCG (2000 UT) capsule Take 4,000 Units by mouth daily. (Patient not taking: Reported on 02/15/2021)    [DISCONTINUED] gabapentin (NEURONTIN) 100 MG capsule Take 1 capsule (100 mg total) by mouth 3 (three) times daily as needed. (Patient not taking: Reported on 05/08/2021)    [DISCONTINUED] ondansetron (ZOFRAN) 8 MG tablet Take 1 tablet (8 mg total) by mouth 2 (two) times daily as needed for refractory nausea / vomiting. (Patient not taking: Reported on 02/13/2021) 11/20/2019: Hasnt started   [DISCONTINUED] Phenylephrine HCl (NEO-SYNEPHRINE NA) Place 1 spray into the nose daily as needed (congestion). (Patient not taking: Reported on 03/28/2021)    [DISCONTINUED] potassium chloride SA (KLOR-CON) 20 MEQ tablet Take 1 tablet (20 mEq total) by mouth 2 (two) times daily. (Patient not taking: Reported on 03/28/2021)    [DISCONTINUED] predniSONE (DELTASONE) 20 MG tablet Take 1 tablet (20 mg total) by mouth daily. Once a day with food x  10 days. (Patient not taking: Reported on 05/08/2021)    [DISCONTINUED] prochlorperazine (COMPAZINE) 10 MG tablet Take 1 tablet (10 mg total) by mouth every 6 (six) hours as needed (Nausea or vomiting). (Patient not taking: Reported on 02/15/2021)    Facility-Administered Encounter Medications as of 05/08/2021  Medication   sodium chloride flush (NS) 0.9 % injection 10 mL   sodium chloride flush (NS) 0.9 % injection 10 mL  Per Hospitalist: "Brief History of Present Illness: Please see H&P for complete details. In  brief, Maria Davies is a 80 y.o. 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 Neurology was consulted with concern for GBS.   Was in her USOH without respiratory illnesses, diarrheal illnesses, vaccines until 28 days ago, the day after she was shopping with her friends in [place] and then had lunch in [place]. The day afterwards, she noted severe tightness in her back Noted some trouble walking up and down the stairs on her way to get a massage. Over the next couple of days, her BLE weakness progressed. Four days later, she went to an outside ED due to severe weakness. Not clearly in an ascending pattern or tripping over her feet per se but severely weak below her baseline. Per chart review, was initially in her RLE and then started to involve her LLE. She then started requiring a walker for her weakness (normally walks unassisted). After the initial decline over the first 4 days, she has stayed relatively stable except for the fact that she is having new falls due to her weakness and imbalance, and now is using a wheelchair because she is afraid of falling. She can walk a few steps to get from the wheelchair to the commode but uses the wheelchair because it is safer. She is not lightheaded or dizzy.   She also feels like her lower extremity numbness and pins & needles sensation is now up to the level of her knees which is more than previous neuropathy thought to be related to her chemotherapy. Reports that she has developed some difficulty with her bowel movements such that she has been straining to go to the bathroom and has been using a stool softener. She feels like her muscles just aren't contracting. She is not incontinent  of her bowels and says when she wipes she can feel. Denied any respiratory distress except for one episode about 2w ago when she "got all caught up and choked" but hasn't had any further episodes since then. No diplopia, dysphagia, dysarthria.    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 On 11/3   She is a retired [employer] se[employer]. Denies smoking or other substance use. Drinks scotch daily until 3-4 weeks ago when these symptoms started, no EtOH since.  She was seen in neurology clinic with Dr. Jennings Books on 04/20/21, who found her subacute BLE weakness and loss of BLE reflexes to be most concerning for GBS. He recommended that she present to the ED for LP and possible IVIg.  Hospital Course: #AIDP  #BLE weakness, areflexia, subacute #Decreased proprioception BLE #Peripheral neuropathy of chemotherapy Ms.  Maria Davies developed quickly-progressive BLE weakness over 4 days about 28 days prior to presentation without any clear precipitant. She has areflexia and decreased proprioception of BLE on exam, with preserved BUE reflexes. These symptoms in combination with the albuminocytologic dissociation seen in CSF are suggestive of AIDP. She is just on the edge of the 4-week limit of benefit for IVIg, but recommend initiating, given that she has been unable to walk for 3 weeks. NCS confirmed evidence of an acquired demyelinating polyneuropathy (e.g. 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 Otilio Jefferson                # Constipation  # Hemorrhoids Started at time of weakness, likely related to AIDP. KUB with moderate to large stool burden. No changes in  urinary patterns. - scheduled miralax qd, colace BID, suppository PRN - hydrocortisone suppository and sitz bath PRN for hemorrhoids   #HTN, CAD, HLD #Severe AS s/p TAVR Continue home meds              - Metoprolol 50 mg XL daily              - Amlodipine 5 mg daily              - Benazepril 40 mg daily              - ASA 81 mg daily   # Grave's Disease # History of hyperthyroidism Initially hyperthyroid 2016, hypothyroid 2016-2018, hyperthyroid since 2021              - Methimazole 5 mg daily   # Low vitamin B12 Level 289 on admission. - Recommended OTC Vitamin B12 1000 mcg daily    # Positive HepBe Antigen Unclear significance of envelope antigen in setting of negative core and surface antigens. [ ]  f/u Hep B DNA Quant PCR and HFP "  Diagnostic Tests Reviewed:  MRI Lumbar spine w wo contrast 04/05/2021 1. Severe facet arthrosis at L4-5 and L5-S1, which may serve as a source of local low back pain.  2. Mild neural foraminal stenosis at right L3-4, right L4-5 and left L5-S1.  3. No spinal canal stenosis.    MRI Thoracic spine wo contrast 04/17/2021 Thoracic disc and facet degeneration without significant stenosis.  Other Diagnostic Studies: CSF studies 04/21/21 Cell count 1, protein 73, glucose 63   NCS 04/21/21 Conclusion: This is an abnormal study.  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 Focal proximal enlargement of the median nerve is supportive of an acquired, demyelinating process. Based on clinical context, these findings could represent an acquired demyelinating polyneuropathy (e.g. 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     Disposition: SNF ([facility]) got home about 4 days ago  Consults:  Neurology  Discharge Instructions: Follow up here, with neurology, cardiology and endocrinology  Disease/illness Education:  Discussed today  Home Health/Community Services Discussions/Referrals:  Establishment or re-establishment of referral orders for community resources:  Discussion with other health care providers:  Assessment and Support of treatment regimen adherence:  Appointments Coordinated with: Patient   Education for self-management, independent living, and ADLs: Discussed today  Since getting out of hospital, she has been doing really well. Feels like she is getting signiicantly better. Her  fingers are much less numb. She feels like is getting stronger and able to walk more. She notes that she has been able to even walk a few steps without her walker. She is doing PT at home. She is generally doing well. She has noted that she has a bit of a cold, but is starting to feel better.   UPPER RESPIRATORY TRACT INFECTION Fever: no Cough: yes Shortness of breath: no Wheezing: no Chest pain: no Chest tightness: no Chest congestion: no Nasal congestion: yes Runny nose: yes Post nasal drip: yes Sneezing: no Sore throat: yes Swollen glands: no Sinus pressure: yes Headache: no Face pain: no Toothache: no Ear pain: no  Ear pressure: no  Eyes red/itching:no Eye drainage/crusting: no  Vomiting: no Rash: no Fatigue: yes Sick contacts: no Strep contacts: no  Context: better Recurrent sinusitis: no Relief with OTC cold/cough medications: no  Treatments attempted: none   Relevant past medical, surgical, family and social history reviewed and updated as indicated. Interim medical history since our last visit reviewed. Allergies and medications reviewed and updated.  Review of Systems  Constitutional: Negative.   Respiratory: Negative.    Cardiovascular: Negative.   Gastrointestinal: Negative.   Musculoskeletal: Negative.   Skin: Negative.   Neurological:  Positive for weakness. Negative for dizziness, tremors, seizures, syncope, facial asymmetry, speech difficulty, light-headedness,  numbness and headaches.  Psychiatric/Behavioral: Negative.     Per HPI unless specifically indicated above     Objective:    BP (!) 93/56   Pulse 92   Temp 97.9 F (36.6 C)   Wt Readings from Last 3 Encounters:  03/24/21 160 lb (72.6 kg)  02/15/21 175 lb (79.4 kg)  02/13/21 175 lb 3.2 oz (79.5 kg)    Physical Exam Vitals and nursing note reviewed.  Constitutional:      General: She is not in acute distress.    Appearance: Normal appearance. She is not ill-appearing, toxic-appearing or diaphoretic.  HENT:     Head: Normocephalic and atraumatic.     Right Ear: External ear normal.     Left Ear: External ear normal.     Nose: Nose normal.     Mouth/Throat:     Mouth: Mucous membranes are moist.     Pharynx: Oropharynx is clear.  Eyes:     General: No scleral icterus.       Right eye: No discharge.        Left eye: No discharge.     Extraocular Movements: Extraocular movements intact.     Conjunctiva/sclera: Conjunctivae normal.     Pupils: Pupils are equal, round, and reactive to light.  Cardiovascular:     Rate and Rhythm: Normal rate and regular rhythm.     Pulses: Normal pulses.     Heart sounds: Normal heart sounds. No murmur heard.   No friction rub. No gallop.  Pulmonary:     Effort: Pulmonary effort is normal. No respiratory distress.     Breath sounds: Normal breath sounds. No stridor. No wheezing, rhonchi or rales.  Chest:     Chest wall: No tenderness.  Musculoskeletal:        General: Normal range of motion.     Cervical back: Normal range of motion and neck supple.  Skin:    General: Skin is warm and dry.     Capillary Refill: Capillary refill takes less than 2 seconds.     Coloration: Skin is not jaundiced or pale.     Findings: No bruising,  erythema, lesion or rash.  Neurological:     General: No focal deficit present.     Mental Status: She is alert and oriented to person, place, and time. Mental status is at baseline.  Psychiatric:        Mood  and Affect: Mood normal.        Behavior: Behavior normal.        Thought Content: Thought content normal.        Judgment: Judgment normal.    Results for orders placed or performed in visit on 03/31/21  Comprehensive metabolic panel  Result Value Ref Range   Sodium 135 135 - 145 mmol/L   Potassium 3.2 (L) 3.5 - 5.1 mmol/L   Chloride 102 98 - 111 mmol/L   CO2 25 22 - 32 mmol/L   Glucose, Bld 121 (H) 70 - 99 mg/dL   BUN 11 8 - 23 mg/dL   Creatinine, Ser 0.57 0.44 - 1.00 mg/dL   Calcium 9.4 8.9 - 10.3 mg/dL   Total Protein 7.1 6.5 - 8.1 g/dL   Albumin 4.1 3.5 - 5.0 g/dL   AST 34 15 - 41 U/L   ALT 33 0 - 44 U/L   Alkaline Phosphatase 78 38 - 126 U/L   Total Bilirubin 1.1 0.3 - 1.2 mg/dL   GFR, Estimated >60 >60 mL/min   Anion gap 8 5 - 15  Lactate dehydrogenase (LDH)  Result Value Ref Range   LDH 272 (H) 98 - 192 U/L  TSH  Result Value Ref Range   TSH 2.322 0.350 - 4.500 uIU/mL  Aldolase  Result Value Ref Range   Aldolase 6.6 3.3 - 10.3 U/L  CK, total  Result Value Ref Range   Total CK 71 38 - 234 U/L      Assessment & Plan:   Problem List Items Addressed This Visit       Nervous and Auditory   Bilateral leg weakness    Doing much better. S/P IVIG. Continue to follow with neurology. Call with any concerns. Continue to monitor.       Relevant Orders   CBC with Differential/Platelet   Guillain Barr syndrome [facility]) - Primary    Doing much better. S/P IVIG. Continue to follow with neurology. Call with any concerns. Continue to monitor.       Relevant Orders   CBC with Differential/Platelet   Other Visit Diagnoses     Low vitamin B12 level       Rechecking labs today. Await results. Treat as needed.    Relevant Orders   B12   CBC with Differential/Platelet   Abnormal hepatitis serology       Hep B quantitative DNA was negative. Will repeat CMP.   Relevant Orders   Comprehensive metabolic panel   Hypotension due to drugs       Will cut her benazepril in  1/2 and recheck 1 month. Call with any concerns.    Relevant Medications   benazepril (LOTENSIN) 20 MG tablet        Follow up plan: Return in about 4 weeks (around 06/05/2021).  >30 minutes spent with patient today.

## 2021-05-08 NOTE — Assessment & Plan Note (Signed)
Doing much better. S/P IVIG. Continue to follow with neurology. Call with any concerns. Continue to monitor.

## 2021-05-09 ENCOUNTER — Encounter: Payer: Self-pay | Admitting: Oncology

## 2021-05-09 LAB — COMPREHENSIVE METABOLIC PANEL
ALT: 26 IU/L (ref 0–32)
AST: 24 IU/L (ref 0–40)
Albumin/Globulin Ratio: 1.4 (ref 1.2–2.2)
Albumin: 3.9 g/dL (ref 3.7–4.7)
Alkaline Phosphatase: 133 IU/L — ABNORMAL HIGH (ref 44–121)
BUN/Creatinine Ratio: 20 (ref 12–28)
BUN: 11 mg/dL (ref 8–27)
Bilirubin Total: 0.3 mg/dL (ref 0.0–1.2)
CO2: 23 mmol/L (ref 20–29)
Calcium: 8.9 mg/dL (ref 8.7–10.3)
Chloride: 102 mmol/L (ref 96–106)
Creatinine, Ser: 0.54 mg/dL — ABNORMAL LOW (ref 0.57–1.00)
Globulin, Total: 2.7 g/dL (ref 1.5–4.5)
Glucose: 113 mg/dL — ABNORMAL HIGH (ref 70–99)
Potassium: 4 mmol/L (ref 3.5–5.2)
Sodium: 137 mmol/L (ref 134–144)
Total Protein: 6.6 g/dL (ref 6.0–8.5)
eGFR: 94 mL/min/{1.73_m2} (ref >59)

## 2021-05-09 LAB — CBC WITH DIFFERENTIAL/PLATELET
Basophils Absolute: 0.1 10*3/uL (ref 0.0–0.2)
Basos: 1 %
EOS (ABSOLUTE): 0.1 10*3/uL (ref 0.0–0.4)
Eos: 1 %
Hematocrit: 34.1 % (ref 34.0–46.6)
Hemoglobin: 12.5 g/dL (ref 11.1–15.9)
Immature Grans (Abs): 0 10*3/uL (ref 0.0–0.1)
Immature Granulocytes: 0 %
Lymphocytes Absolute: 2.1 10*3/uL (ref 0.7–3.1)
Lymphs: 37 %
MCH: 33.4 pg — ABNORMAL HIGH (ref 26.6–33.0)
MCHC: 36.7 g/dL — ABNORMAL HIGH (ref 31.5–35.7)
MCV: 91 fL (ref 79–97)
Monocytes Absolute: 0.9 10*3/uL (ref 0.1–0.9)
Monocytes: 16 %
Neutrophils Absolute: 2.5 10*3/uL (ref 1.4–7.0)
Neutrophils: 45 %
Platelets: 199 10*3/uL (ref 150–450)
RBC: 3.74 x10E6/uL — ABNORMAL LOW (ref 3.77–5.28)
RDW: 10.8 % — ABNORMAL LOW (ref 11.7–15.4)
WBC: 5.6 10*3/uL (ref 3.4–10.8)

## 2021-05-09 LAB — VITAMIN B12: Vitamin B-12: 1514 pg/mL — ABNORMAL HIGH (ref 232–1245)

## 2021-05-16 ENCOUNTER — Other Ambulatory Visit: Payer: Self-pay | Admitting: *Deleted

## 2021-05-17 ENCOUNTER — Inpatient Hospital Stay: Payer: Medicare Other | Attending: Oncology

## 2021-06-05 ENCOUNTER — Ambulatory Visit (INDEPENDENT_AMBULATORY_CARE_PROVIDER_SITE_OTHER): Payer: Medicare Other | Admitting: Family Medicine

## 2021-06-05 ENCOUNTER — Other Ambulatory Visit: Payer: Self-pay

## 2021-06-05 ENCOUNTER — Encounter: Payer: Self-pay | Admitting: Family Medicine

## 2021-06-05 VITALS — BP 128/73 | HR 92 | Temp 98.1°F | Wt 176.0 lb

## 2021-06-05 DIAGNOSIS — I10 Essential (primary) hypertension: Secondary | ICD-10-CM

## 2021-06-05 DIAGNOSIS — G610 Guillain-Barre syndrome: Secondary | ICD-10-CM

## 2021-06-05 MED ORDER — AMLODIPINE BESYLATE 5 MG PO TABS: 5.0000 mg | ORAL_TABLET | Freq: Every day | ORAL | 1 refills | Status: DC

## 2021-06-05 MED ORDER — METOPROLOL SUCCINATE ER 50 MG PO TB24: 50.0000 mg | ORAL_TABLET | Freq: Every day | ORAL | 1 refills | Status: DC

## 2021-06-05 NOTE — Assessment & Plan Note (Signed)
Under good control on current regimen. Continue current regimen. Continue to monitor. Call with any concerns. Refills given. Labs drawn today.   

## 2021-06-05 NOTE — Assessment & Plan Note (Signed)
Continues to improve. Feeling better. Call with any concerns. Continue to monitor.

## 2021-06-05 NOTE — Progress Notes (Signed)
BP 128/73    Pulse 92    Temp 98.1 F (36.7 C)    Wt 176 lb (79.8 kg)    SpO2 98%    BMI 31.18 kg/m    Subjective:    Patient ID: Maria Davies, female    DOB: 04-09-1941, 80 y.o.   MRN: [id number]  HPI: Maria Davies is a 80 y.o. female  Chief Complaint  Patient presents with   Hypotension   HYPOTENSION Hypertension status: better  Satisfied with current treatment? yes Duration of hypertension: chronic BP monitoring frequency:  not checking BP medication side effects:  no Medication compliance: excellent compliance Previous BP meds:amlodipine, metoprolol, benazepril Aspirin: yes Recurrent headaches: no Visual changes: no Palpitations: no Dyspnea: no Chest pain: no Lower extremity edema: no Dizzy/lightheaded: no   Relevant past medical, surgical, family and social history reviewed and updated as indicated. Interim medical history since our last visit reviewed. Allergies and medications reviewed and updated.  Review of Systems  Constitutional: Negative.   Respiratory: Negative.    Cardiovascular: Negative.   Musculoskeletal: Negative.   Neurological:  Positive for weakness and numbness. Negative for dizziness, tremors, seizures, syncope, facial asymmetry, speech difficulty, light-headedness and headaches.  Psychiatric/Behavioral: Negative.     Per HPI unless specifically indicated above     Objective:    BP 128/73    Pulse 92    Temp 98.1 F (36.7 C)    Wt 176 lb (79.8 kg)    SpO2 98%    BMI 31.18 kg/m   Wt Readings from Last 3 Encounters:  06/05/21 176 lb (79.8 kg)  03/24/21 160 lb (72.6 kg)  02/15/21 175 lb (79.4 kg)    Physical Exam Vitals and nursing note reviewed.  Constitutional:      General: She is not in acute distress.    Appearance: Normal appearance. She is not ill-appearing, toxic-appearing or diaphoretic.  HENT:     Head: Normocephalic and atraumatic.     Right Ear: External ear normal.     Left Ear: External ear normal.     Nose:  Nose normal.     Mouth/Throat:     Mouth: Mucous membranes are moist.     Pharynx: Oropharynx is clear.  Eyes:     General: No scleral icterus.       Right eye: No discharge.        Left eye: No discharge.     Extraocular Movements: Extraocular movements intact.     Conjunctiva/sclera: Conjunctivae normal.     Pupils: Pupils are equal, round, and reactive to light.  Cardiovascular:     Rate and Rhythm: Normal rate and regular rhythm.     Pulses: Normal pulses.     Heart sounds: Normal heart sounds. No murmur heard.   No friction rub. No gallop.  Pulmonary:     Effort: Pulmonary effort is normal. No respiratory distress.     Breath sounds: Normal breath sounds. No stridor. No wheezing, rhonchi or rales.  Chest:     Chest wall: No tenderness.  Musculoskeletal:        General: Normal range of motion.     Cervical back: Normal range of motion and neck supple.  Skin:    General: Skin is warm and dry.     Capillary Refill: Capillary refill takes less than 2 seconds.     Coloration: Skin is not jaundiced or pale.     Findings: No bruising, erythema, lesion or rash.  Neurological:  General: No focal deficit present.     Mental Status: She is alert and oriented to person, place, and time. Mental status is at baseline.  Psychiatric:        Mood and Affect: Mood normal.        Behavior: Behavior normal.        Thought Content: Thought content normal.        Judgment: Judgment normal.    Results for orders placed or performed in visit on 05/08/21  B12  Result Value Ref Range   Vitamin B-12 1,514 (H) 232 - 1,245 pg/mL  Comprehensive metabolic panel  Result Value Ref Range   Glucose 113 (H) 70 - 99 mg/dL   BUN 11 8 - 27 mg/dL   Creatinine, Ser 0.54 (L) 0.57 - 1.00 mg/dL   eGFR 94 >59 mL/min/1.73   BUN/Creatinine Ratio 20 12 - 28   Sodium 137 134 - 144 mmol/L   Potassium 4.0 3.5 - 5.2 mmol/L   Chloride 102 96 - 106 mmol/L   CO2 23 20 - 29 mmol/L   Calcium 8.9 8.7 - 10.3  mg/dL   Total Protein 6.6 6.0 - 8.5 g/dL   Albumin 3.9 3.7 - 4.7 g/dL   Globulin, Total 2.7 1.5 - 4.5 g/dL   Albumin/Globulin Ratio 1.4 1.2 - 2.2   Bilirubin Total 0.3 0.0 - 1.2 mg/dL   Alkaline Phosphatase 133 (H) 44 - 121 IU/L   AST 24 0 - 40 IU/L   ALT 26 0 - 32 IU/L  CBC with Differential/Platelet  Result Value Ref Range   WBC 5.6 3.4 - 10.8 x10E3/uL   RBC 3.74 (L) 3.77 - 5.28 x10E6/uL   Hemoglobin 12.5 11.1 - 15.9 g/dL   Hematocrit 34.1 34.0 - 46.6 %   MCV 91 79 - 97 fL   MCH 33.4 (H) 26.6 - 33.0 pg   MCHC 36.7 (H) 31.5 - 35.7 g/dL   RDW 10.8 (L) 11.7 - 15.4 %   Platelets 199 150 - 450 x10E3/uL   Neutrophils 45 Not Estab. %   Lymphs 37 Not Estab. %   Monocytes 16 Not Estab. %   Eos 1 Not Estab. %   Basos 1 Not Estab. %   Neutrophils Absolute 2.5 1.4 - 7.0 x10E3/uL   Lymphocytes Absolute 2.1 0.7 - 3.1 x10E3/uL   Monocytes Absolute 0.9 0.1 - 0.9 x10E3/uL   EOS (ABSOLUTE) 0.1 0.0 - 0.4 x10E3/uL   Basophils Absolute 0.1 0.0 - 0.2 x10E3/uL   Immature Granulocytes 0 Not Estab. %   Immature Grans (Abs) 0.0 0.0 - 0.1 x10E3/uL   Hematology Comments: Note:       Assessment & Plan:   Problem List Items Addressed This Visit       Cardiovascular and Mediastinum   Hypertension - Primary    Under good control on current regimen. Continue current regimen. Continue to monitor. Call with any concerns. Refills given. Labs drawn today.        Relevant Medications   amLODipine (NORVASC) 5 MG tablet   metoprolol succinate (TOPROL-XL) 50 MG 24 hr tablet   Other Relevant Orders   Basic metabolic panel     Nervous and Auditory   Guillain Barr syndrome ([place])    Continues to improve. Feeling better. Call with any concerns. Continue to monitor.         Follow up plan: Return in about 5 months (around 11/03/2021).

## 2021-06-06 ENCOUNTER — Encounter: Payer: Self-pay | Admitting: Family Medicine

## 2021-06-06 ENCOUNTER — Encounter: Payer: Self-pay | Admitting: Oncology

## 2021-06-06 LAB — BASIC METABOLIC PANEL
BUN/Creatinine Ratio: 33 — ABNORMAL HIGH (ref 12–28)
BUN: 13 mg/dL (ref 8–27)
CO2: 22 mmol/L (ref 20–29)
Calcium: 9.5 mg/dL (ref 8.7–10.3)
Chloride: 103 mmol/L (ref 96–106)
Creatinine, Ser: 0.39 mg/dL — ABNORMAL LOW (ref 0.57–1.00)
Glucose: 103 mg/dL — ABNORMAL HIGH (ref 70–99)
Potassium: 3.6 mmol/L (ref 3.5–5.2)
Sodium: 140 mmol/L (ref 134–144)
eGFR: 102 mL/min/{1.73_m2} (ref >59)

## 2021-06-22 DIAGNOSIS — Z8669 Personal history of other diseases of the nervous system and sense organs: Secondary | ICD-10-CM | POA: Diagnosis not present

## 2021-07-04 DIAGNOSIS — I442 Atrioventricular block, complete: Secondary | ICD-10-CM | POA: Diagnosis not present

## 2021-07-11 DIAGNOSIS — E0590 Thyrotoxicosis, unspecified without thyrotoxic crisis or storm: Secondary | ICD-10-CM | POA: Diagnosis not present

## 2021-07-17 DIAGNOSIS — E0590 Thyrotoxicosis, unspecified without thyrotoxic crisis or storm: Secondary | ICD-10-CM | POA: Diagnosis not present

## 2021-07-19 DIAGNOSIS — G610 Guillain-Barre syndrome: Secondary | ICD-10-CM | POA: Diagnosis not present

## 2021-07-19 DIAGNOSIS — Z8669 Personal history of other diseases of the nervous system and sense organs: Secondary | ICD-10-CM | POA: Diagnosis not present

## 2021-08-11 NOTE — Progress Notes (Signed)
[place]  [phone] Fax:([phone]) [phone]  ID: Maria Davies OB: 05-Jun-1941  MR#: [id number]  GYB#:[id number]  Patient Care Team: Valerie Roys, DO as PCP - General (Family Medicine) Guadalupe Maple, MD as PCP - Family Medicine (Family Medicine) Ronny Bacon, MD as Consulting Physician (General Surgery) Lloyd Huger, MD as Consulting Physician (Oncology)   CHIEF COMPLAINT: Stage IIIb right colon cancer.  INTERVAL HISTORY: Patient returns to clinic today for routine 41-monthevaluation.  She continues to feel well and remains asymptomatic.  She was admitted to the hospital with Guillain-Barr type symptoms several months ago, but these have resolved and she is back to her baseline.  No etiology was ever determined.  She has no neurologic complaints.  She denies any recent fevers or illnesses.  She has no chest pain, shortness of breath, cough, or hemoptysis. She denies any vomiting, constipation, or diarrhea.  She has no melena or hematochezia.  She has no urinary complaints.  Patient offers no further specific complaints today.  REVIEW OF SYSTEMS:   Review of Systems  Constitutional: Negative.  Negative for fever, malaise/fatigue and weight loss.  Respiratory: Negative.  Negative for cough, hemoptysis and shortness of breath.   Cardiovascular: Negative.  Negative for chest pain and leg swelling.  Gastrointestinal: Negative.  Negative for abdominal pain, blood in stool, melena and nausea.  Genitourinary: Negative.  Negative for hematuria.  Musculoskeletal: Negative.  Negative for back pain.  Skin: Negative.  Negative for rash.  Neurological: Negative.  Negative for dizziness, focal weakness, weakness and headaches.  Psychiatric/Behavioral: Negative.  The patient is not nervous/anxious.    As per HPI. Otherwise, a complete review of systems is negative.   PAST MEDICAL HISTORY: Past Medical History:  Diagnosis Date   Anemia    Arthritis     Basal cell carcinoma    back   [place]   [place]   [place]   Chemotherapy induced nausea and vomiting    [place]   colon cancer   Heart murmur    Hypertension    Hypothyroidism    currently not on meds   Osteopenia    Personal history of chemotherapy 2010   right breast ca   Personal history of radiation therapy 2010   right breast ca   Port-A-Cath in place     PAST SURGICAL HISTORY: Past Surgical History:  Procedure Laterality Date   aortic valve stenosis  08/11/2020   BACK SURGERY     BREAST EXCISIONAL BIOPSY Right 03/2009   radiation and chemo +   BREAST EXCISIONAL BIOPSY Bilateral    NEG   BREAST SURGERY     CATARACT EXTRACTION W/ INTRAOCULAR LENS  IMPLANT, BILATERAL     CHOLECYSTECTOMY     COLONOSCOPY WITH PROPOFOL N/A 10/19/2019   Procedure: COLONOSCOPY WITH PROPOFOL-attempted, unable to perform poor prep;  Surgeon: WLucilla Lame MD;  [place] [facility]  Service: Endoscopy;  Laterality: N/A;  priority 3   COLONOSCOPY WITH PROPOFOL N/A 10/20/2019   Procedure: COLONOSCOPY WITH PROPOFOL;  Surgeon: WLucilla Lame MD;  [facility];  Service: Endoscopy;  Laterality: N/A;   ESOPHAGEAL DILATION  10/19/2019   Procedure: ESOPHAGEAL DILATION;  Surgeon: WLucilla Lame MD;  [place]  Service: Endoscopy;;   ESOPHAGOGASTRODUODENOSCOPY (EGD) WITH PROPOFOL N/A 10/19/2019   Procedure: ESOPHAGOGASTRODUODENOSCOPY (EGD) WITH PROPOFOL;  Surgeon: WLucilla Lame MD;  [place]  Service: Endoscopy;  Laterality: N/A;   PORTACATH PLACEMENT Right 11/23/2019   Procedure: INSERTION PORT-A-CATH;  Surgeon: Ronny Bacon, MD;  Location: [facility];  Service: General;  Laterality: Right;    FAMILY HISTORY: Family History  Problem Relation Age of Onset   Cancer Mother    Heart disease Father    Breast cancer Paternal Aunt 29    ADVANCED DIRECTIVES (Y/N):  N  HEALTH  MAINTENANCE: Social History   Tobacco Use   Smoking status: Never   Smokeless tobacco: Never  Vaping Use   Vaping Use: Never used  Substance Use Topics   Alcohol use: Yes    Alcohol/week: 2.0 standard drinks    Types: 2 Standard drinks or equivalent per week    Comment: only on weeks not having treatment   Drug use: No     Colonoscopy:  PAP:  Bone density:  Lipid panel:  No Known Allergies  Current Outpatient Medications  Medication Sig Dispense Refill   acetaminophen (TYLENOL) 325 MG tablet Take 325 mg by mouth 3 (three) times daily as needed for moderate pain or headache.     amLODipine (NORVASC) 5 MG tablet Take 1 tablet (5 mg total) by mouth daily. 90 tablet 1   aspirin EC 81 MG tablet Take 81 mg by mouth daily.     benazepril (LOTENSIN) 20 MG tablet Take 1 tablet (20 mg total) by mouth daily. 90 tablet 1   cyclobenzaprine (FLEXERIL) 10 MG tablet Take 1 tablet (10 mg total) by mouth 3 (three) times daily as needed for muscle spasms. 30 tablet 0   lidocaine-prilocaine (EMLA) cream Apply to affected area once 30 g 3   methimazole (TAPAZOLE) 10 MG tablet Take 10 mg by mouth daily.     methimazole (TAPAZOLE) 5 MG tablet Take 5 mg by mouth daily.     metoprolol succinate (TOPROL-XL) 50 MG 24 hr tablet Take 1 tablet (50 mg total) by mouth daily. Take with or immediately following a meal. 90 tablet 1   Multiple Vitamin (MULTI-VITAMINS) TABS Take 1 tablet by mouth daily.      Probiotic Product (PROBIOTIC ADVANCED PO) Take 1-2 capsules by mouth daily. When taking antibiotics pt will take 2 capsules instead of 1, normally just takes 1 per day otherwise     silver sulfADIAZINE (SILVADENE) 1 % cream Apply 1 application topically daily. 50 g 0   Sodium Hyaluronate, oral, (HYALURONIC ACID PO) Take 1 tablet by mouth daily.     vitamin B-12 (CYANOCOBALAMIN) 1000 MCG tablet Take 1,000 mcg by mouth daily.     ZINC CITRATE PO Take by mouth.     No current facility-administered medications  for this visit.   Facility-Administered Medications Ordered in Other Visits  Medication Dose Route Frequency Provider Last Rate Last Admin   sodium chloride flush (NS) 0.9 % injection 10 mL  10 mL Intravenous PRN Lloyd Huger, MD   10 mL at 03/07/20 0836   sodium chloride flush (NS) 0.9 % injection 10 mL  10 mL Intravenous PRN Lloyd Huger, MD   10 mL at 06/21/20 1259    OBJECTIVE: Vitals:   08/16/21 1035  BP: 134/69  Pulse: 91  Temp: 98.2 F (36.8 C)     Body mass index is 32.17 kg/m.    ECOG FS:0 - Asymptomatic  General: Well-developed, well-nourished, no acute distress. Eyes: Pink conjunctiva, anicteric sclera. HEENT: Normocephalic, moist mucous membranes. Lungs: No audible wheezing or coughing. Heart: Regular rate and rhythm. Abdomen: Soft, nontender, no obvious  distention. Musculoskeletal: No edema, cyanosis, or clubbing. Neuro: Alert, answering all questions appropriately. Cranial nerves grossly intact. Skin: No rashes or petechiae noted. Psych: Normal affect.  LAB RESULTS:  Lab Results  Component Value Date   NA 140 06/05/2021   K 3.6 06/05/2021   CL 103 06/05/2021   CO2 22 06/05/2021   GLUCOSE 103 (H) 06/05/2021   BUN 13 06/05/2021   CREATININE 0.39 (L) 06/05/2021   CALCIUM 9.5 06/05/2021   PROT 6.6 05/08/2021   ALBUMIN 3.9 05/08/2021   AST 24 05/08/2021   ALT 26 05/08/2021   ALKPHOS 133 (H) 05/08/2021   BILITOT 0.3 05/08/2021   GFRNONAA >60 03/31/2021   GFRAA >60 03/14/2020    Lab Results  Component Value Date   WBC 6.4 08/16/2021   NEUTROABS 3.7 08/16/2021   HGB 14.2 08/16/2021   HCT 40.8 08/16/2021   MCV 97.1 08/16/2021   PLT 161 08/16/2021     STUDIES: No results found.  ASSESSMENT: Stage IIIb right colon cancer  PLAN:    1. Stage IIIb right colon cancer: Pathology results reviewed, confirming stage of disease.  Port has been removed.  She only received 11 treatments of FOLFOX secondary to declining performance status  completing on April 25, 2020.  CT scan results from January 22, 2021 reviewed independently with no obvious evidence of recurrent or progressive disease.  Repeat imaging in August 2023.  Patient's most recent CEA was reported at 2.3.  Patient has not had colonoscopy since the time of her diagnosis and a referral was sent back to GI.  Return to clinic in 6 months with repeat imaging and laboratory work followed by further evaluation.   2.  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  She completed 5 years of anastrozole in June 2016.  Her most recent mammogram on February 06, 2021 was reported as BI-RADS 1.  Repeat in August 2023. 3.  Anemia: Resolved. 4.  Thrombocytopenia: Resolved. 5.  Heart disease: Continue follow-up at [facility] as scheduled.  6.  Guillain-Barr: Unclear etiology.  Patient's symptoms have resolved.  Patient expressed understanding and was in agreement with this plan. She also understands that She can call clinic at any time with any questions, concerns, or complaints.    Cancer Staging  Colon cancer [facility]) Staging form: Colon and Rectum, AJCC 8th Edition - Clinical stage from 11/13/2019: Stage IIIB (cT3, cN1a, cM0) - Signed by Lloyd Huger, MD on 11/13/2019 Total positive nodes: 1   Lloyd Huger, MD   08/16/2021 3:48 PM

## 2021-08-14 ENCOUNTER — Ambulatory Visit (INDEPENDENT_AMBULATORY_CARE_PROVIDER_SITE_OTHER): Payer: Medicare Other | Admitting: Family Medicine

## 2021-08-14 ENCOUNTER — Other Ambulatory Visit: Payer: Self-pay

## 2021-08-14 DIAGNOSIS — I700 Atherosclerosis of aorta: Secondary | ICD-10-CM

## 2021-08-14 DIAGNOSIS — I10 Essential (primary) hypertension: Secondary | ICD-10-CM

## 2021-08-14 DIAGNOSIS — E039 Hypothyroidism, unspecified: Secondary | ICD-10-CM

## 2021-08-14 DIAGNOSIS — D500 Iron deficiency anemia secondary to blood loss (chronic): Secondary | ICD-10-CM

## 2021-08-14 NOTE — Progress Notes (Signed)
Did not need to be seen today. Appt cancelled. Follow up in May.

## 2021-08-15 DIAGNOSIS — I110 Hypertensive heart disease with heart failure: Secondary | ICD-10-CM | POA: Diagnosis not present

## 2021-08-15 DIAGNOSIS — I700 Atherosclerosis of aorta: Secondary | ICD-10-CM | POA: Diagnosis not present

## 2021-08-15 DIAGNOSIS — I10 Essential (primary) hypertension: Secondary | ICD-10-CM | POA: Diagnosis not present

## 2021-08-15 DIAGNOSIS — Z952 Presence of prosthetic heart valve: Secondary | ICD-10-CM | POA: Diagnosis not present

## 2021-08-15 DIAGNOSIS — I442 Atrioventricular block, complete: Secondary | ICD-10-CM | POA: Diagnosis not present

## 2021-08-15 DIAGNOSIS — Z950 Presence of cardiac pacemaker: Secondary | ICD-10-CM | POA: Diagnosis not present

## 2021-08-15 DIAGNOSIS — I350 Nonrheumatic aortic (valve) stenosis: Secondary | ICD-10-CM | POA: Diagnosis not present

## 2021-08-16 ENCOUNTER — Inpatient Hospital Stay: Payer: Medicare Other | Attending: Oncology

## 2021-08-16 ENCOUNTER — Other Ambulatory Visit: Payer: Self-pay

## 2021-08-16 ENCOUNTER — Inpatient Hospital Stay (HOSPITAL_BASED_OUTPATIENT_CLINIC_OR_DEPARTMENT_OTHER): Payer: Medicare Other | Admitting: Oncology

## 2021-08-16 ENCOUNTER — Encounter: Payer: Self-pay | Admitting: Oncology

## 2021-08-16 ENCOUNTER — Telehealth: Payer: Self-pay

## 2021-08-16 VITALS — BP 134/69 | HR 91 | Temp 98.2°F | Wt 181.6 lb

## 2021-08-16 DIAGNOSIS — R011 Cardiac murmur, unspecified: Secondary | ICD-10-CM | POA: Insufficient documentation

## 2021-08-16 DIAGNOSIS — Z85038 Personal history of other malignant neoplasm of large intestine: Secondary | ICD-10-CM | POA: Insufficient documentation

## 2021-08-16 DIAGNOSIS — Z853 Personal history of malignant neoplasm of breast: Secondary | ICD-10-CM | POA: Insufficient documentation

## 2021-08-16 DIAGNOSIS — G610 Guillain-Barre syndrome: Secondary | ICD-10-CM | POA: Insufficient documentation

## 2021-08-16 DIAGNOSIS — C182 Malignant neoplasm of ascending colon: Secondary | ICD-10-CM

## 2021-08-16 DIAGNOSIS — I519 Heart disease, unspecified: Secondary | ICD-10-CM | POA: Insufficient documentation

## 2021-08-16 DIAGNOSIS — Z79899 Other long term (current) drug therapy: Secondary | ICD-10-CM | POA: Insufficient documentation

## 2021-08-16 DIAGNOSIS — Z809 Family history of malignant neoplasm, unspecified: Secondary | ICD-10-CM | POA: Diagnosis not present

## 2021-08-16 DIAGNOSIS — Z85828 Personal history of other malignant neoplasm of skin: Secondary | ICD-10-CM | POA: Insufficient documentation

## 2021-08-16 DIAGNOSIS — I10 Essential (primary) hypertension: Secondary | ICD-10-CM | POA: Diagnosis not present

## 2021-08-16 DIAGNOSIS — E039 Hypothyroidism, unspecified: Secondary | ICD-10-CM | POA: Diagnosis not present

## 2021-08-16 DIAGNOSIS — Z9221 Personal history of antineoplastic chemotherapy: Secondary | ICD-10-CM | POA: Insufficient documentation

## 2021-08-16 DIAGNOSIS — M8580 Other specified disorders of bone density and structure, unspecified site: Secondary | ICD-10-CM | POA: Insufficient documentation

## 2021-08-16 DIAGNOSIS — Z800 Family history of malignant neoplasm of digestive organs: Secondary | ICD-10-CM | POA: Diagnosis not present

## 2021-08-16 DIAGNOSIS — Z923 Personal history of irradiation: Secondary | ICD-10-CM | POA: Diagnosis not present

## 2021-08-16 DIAGNOSIS — Z7982 Long term (current) use of aspirin: Secondary | ICD-10-CM | POA: Diagnosis not present

## 2021-08-16 DIAGNOSIS — Z1231 Encounter for screening mammogram for malignant neoplasm of breast: Secondary | ICD-10-CM

## 2021-08-16 LAB — CBC WITH DIFFERENTIAL/PLATELET
Abs Immature Granulocytes: 0.03 10*3/uL (ref 0.00–0.07)
Basophils Absolute: 0 10*3/uL (ref 0.0–0.1)
Basophils Relative: 1 %
Eosinophils Absolute: 0.1 10*3/uL (ref 0.0–0.5)
Eosinophils Relative: 2 %
HCT: 40.8 % (ref 36.0–46.0)
Hemoglobin: 14.2 g/dL (ref 12.0–15.0)
Immature Granulocytes: 1 %
Lymphocytes Relative: 30 %
Lymphs Abs: 1.9 10*3/uL (ref 0.7–4.0)
MCH: 33.8 pg (ref 26.0–34.0)
MCHC: 34.8 g/dL (ref 30.0–36.0)
MCV: 97.1 fL (ref 80.0–100.0)
Monocytes Absolute: 0.7 10*3/uL (ref 0.1–1.0)
Monocytes Relative: 10 %
Neutro Abs: 3.7 10*3/uL (ref 1.7–7.7)
Neutrophils Relative %: 56 %
Platelets: 161 10*3/uL (ref 150–400)
RBC: 4.2 MIL/uL (ref 3.87–5.11)
RDW: 12.6 % (ref 11.5–15.5)
WBC: 6.4 10*3/uL (ref 4.0–10.5)
nRBC: 0 % (ref 0.0–0.2)

## 2021-08-16 NOTE — Progress Notes (Signed)
Made appointment for patient she has pacemaker aorta valve and hx of polyp to be seen in person ?

## 2021-08-16 NOTE — Progress Notes (Signed)
Gastroenterology Pre-Procedure Review ? ?Request Date: na ?Requesting Physician: Dr. Lauro Regulus ? ?PATIENT REVIEW QUESTIONS: The patient responded to the following health history questions as indicated:   ? ?1. Are you having any GI issues? no ?2. Do you have a personal history of Polyps? yes (last colonoscopy ) ?3. Do you have a family history of Colon Cancer or Polyps? no ?4. Diabetes Mellitus? no ?5. Joint replacements in the past 12 months?no ?6. 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 Any artificial heart valves, MVP, or defibrillator?yes (pacemaker and aorta valve ) ?   ?MEDICATIONS & ALLERGIES:    ?Patient reports the following regarding taking any anticoagulation/antiplatelet therapy:   ?Plavix, Coumadin, Eliquis, Xarelto, Lovenox, Pradaxa, Brilinta, or Effient? no ?Aspirin? yes (asprin 81 mg) ? ?Patient confirms/reports the following medications:  ?Current Outpatient Medications  ?Medication Sig Dispense Refill  ? acetaminophen (TYLENOL) 325 MG tablet Take 325 mg by mouth 3 (three) times daily as needed for moderate pain or headache.    ? amLODipine (NORVASC) 5 MG tablet Take 1 tablet (5 mg total) by mouth daily. 90 tablet 1  ? aspirin EC 81 MG tablet Take 81 mg by mouth daily.    ? benazepril (LOTENSIN) 20 MG tablet Take 1 tablet (20 mg total) by mouth daily. 90 tablet 1  ? cyclobenzaprine (FLEXERIL) 10 MG tablet Take 1 tablet (10 mg total) by mouth 3 (three) times daily as needed for muscle spasms. 30 tablet 0  ? lidocaine-prilocaine (EMLA) cream Apply to affected area once 30 g 3  ? methimazole (TAPAZOLE) 10 MG tablet Take 10 mg by mouth daily.    ? methimazole (TAPAZOLE) 5 MG tablet Take 5 mg by mouth daily.    ? metoprolol succinate (TOPROL-XL) 50 MG 24 hr tablet Take 1 tablet (50 mg total) by mouth daily. Take with or immediately following a meal. 90 tablet 1  ? Multiple Vitamin (MULTI-VITAMINS) TABS Take 1 tablet by mouth daily.     ? Probiotic Product (PROBIOTIC ADVANCED  PO) Take 1-2 capsules by mouth daily. When taking antibiotics pt will take 2 capsules instead of 1, normally just takes 1 per day otherwise    ? silver sulfADIAZINE (SILVADENE) 1 % cream Apply 1 application topically daily. 50 g 0  ? Sodium Hyaluronate, oral, (HYALURONIC ACID PO) Take 1 tablet by mouth daily.    ? vitamin B-12 (CYANOCOBALAMIN) 1000 MCG tablet Take 1,000 mcg by mouth daily.    ? ZINC CITRATE PO Take by mouth.    ? ?No current facility-administered medications for this visit.  ? ?Facility-Administered Medications Ordered in Other Visits  ?Medication Dose Route Frequency Provider Last Rate Last Admin  ? sodium chloride flush (NS) 0.9 % injection 10 mL  10 mL Intravenous PRN Lloyd Huger, MD   10 mL at 03/07/20 0836  ? sodium chloride flush (NS) 0.9 % injection 10 mL  10 mL Intravenous PRN Lloyd Huger, MD   10 mL at 06/21/20 1259  ? ? ?Patient confirms/reports the following allergies:  ?No Known Allergies ? ?No orders of the defined types were placed in this encounter. ? ? ?AUTHORIZATION INFORMATION ?Primary Insurance: ?1D#: ?Group #: ? ?Secondary Insurance: ?1D#: ?Group #: ? ?SCHEDULE INFORMATION: ?Date: na ?Time: ?Location:na ? ?

## 2021-08-16 NOTE — Telephone Encounter (Signed)
CALLED PATIENT NO ANSWER LEFT VOICEMAIL FOR A CALL BACK ? ?

## 2021-08-17 LAB — CEA: CEA: 1.6 ng/mL (ref 0.0–4.7)

## 2021-08-21 ENCOUNTER — Ambulatory Visit (INDEPENDENT_AMBULATORY_CARE_PROVIDER_SITE_OTHER): Payer: Medicare Other | Admitting: *Deleted

## 2021-08-21 DIAGNOSIS — Z0000 Encounter for general adult medical examination without abnormal findings: Secondary | ICD-10-CM | POA: Diagnosis not present

## 2021-08-21 NOTE — Progress Notes (Signed)
Subjective:   Maria Davies is a 81 y.o. female who presents for Medicare Annual (Subsequent) preventive examination.  I connected with  Maria Davies on 08/21/21 by a telephone enabled telemedicine application and verified that I am speaking with the correct person using two identifiers.   I discussed the limitations of evaluation and management by telemedicine. The patient expressed understanding and agreed to proceed.  Patient location: home  Provider location:  Tele-health not in office   Review of Systems     Cardiac Risk Factors include: advanced age (>68mn, >>53women);hypertension     Objective:    Today's Vitals   There is no height or weight on file to calculate BMI.  Advanced Directives 08/21/2021 08/16/2021 03/24/2021 02/15/2021 09/20/2020 08/19/2020 06/21/2020  Does Patient Have a Medical Advance Directive? Yes No No Yes Yes Yes Yes  Type of Advance Directive [place] will [place] will [place] will [place] will  Does patient want to make changes to medical advance directive? - No - Patient declined - - No - Patient declined - No - Patient declined  Copy of [place] Chart? Yes - validated most recent copy scanned in chart (See row information) - - - No - copy requested No - copy requested No - copy requested  Would patient like information on creating a medical advance directive? - - - - - - -    Current Medications (verified) Outpatient Encounter Medications as of 08/21/2021  Medication Sig   acetaminophen (TYLENOL) 325 MG tablet Take 325 mg by mouth 3 (three) times daily as needed for moderate pain or headache.   amLODipine (NORVASC) 5 MG tablet Take 1 tablet (5 mg total) by mouth daily.   aspirin EC 81 MG tablet Take 81 mg by mouth daily.   benazepril (LOTENSIN) 20 MG tablet Take 1 tablet (20 mg total) by  mouth daily.   cyclobenzaprine (FLEXERIL) 10 MG tablet Take 1 tablet (10 mg total) by mouth 3 (three) times daily as needed for muscle spasms.   lidocaine-prilocaine (EMLA) cream Apply to affected area once   methimazole (TAPAZOLE) 10 MG tablet Take 10 mg by mouth daily.   methimazole (TAPAZOLE) 5 MG tablet Take 5 mg by mouth daily.   metoprolol succinate (TOPROL-XL) 50 MG 24 hr tablet Take 1 tablet (50 mg total) by mouth daily. Take with or immediately following a meal.   Multiple Vitamin (MULTI-VITAMINS) TABS Take 1 tablet by mouth daily.    Probiotic Product (PROBIOTIC ADVANCED PO) Take 1-2 capsules by mouth daily. When taking antibiotics pt will take 2 capsules instead of 1, normally just takes 1 per day otherwise   silver sulfADIAZINE (SILVADENE) 1 % cream Apply 1 application topically daily.   Sodium Hyaluronate, oral, (HYALURONIC ACID PO) Take 1 tablet by mouth daily.   vitamin B-12 (CYANOCOBALAMIN) 1000 MCG tablet Take 1,000 mcg by mouth daily.   ZINC CITRATE PO Take by mouth.   Facility-Administered Encounter Medications as of 08/21/2021  Medication   sodium chloride flush (NS) 0.9 % injection 10 mL   sodium chloride flush (NS) 0.9 % injection 10 mL    Allergies (verified) Patient has no known allergies.   History: Past Medical History:  Diagnosis Date   Anemia    Arthritis    Basal cell carcinoma    back   [place]   [facility] LUMPECTOMY  Cancer [facility])    rt breast   Chemotherapy induced nausea and vomiting    Colon cancer ([place]) 2021   colon cancer   Heart murmur    Hypertension    Hypothyroidism    currently not on meds   Osteopenia    Personal history of chemotherapy 2010   right breast ca   Personal history of radiation therapy 2010   right breast ca   Port-A-Cath in place    Past Surgical History:  Procedure Laterality Date   aortic valve stenosis  08/11/2020   BACK SURGERY     BREAST EXCISIONAL BIOPSY Right 03/2009   radiation and chemo +    BREAST EXCISIONAL BIOPSY Bilateral    NEG   BREAST SURGERY     CATARACT EXTRACTION W/ INTRAOCULAR LENS  IMPLANT, BILATERAL     CHOLECYSTECTOMY     COLONOSCOPY WITH PROPOFOL N/A 10/19/2019   Procedure: COLONOSCOPY WITH PROPOFOL-attempted, unable to perform poor prep;  Surgeon: Lucilla Lame, MD;  Location: [place];  Service: Endoscopy;  Laterality: N/A;  priority 3   COLONOSCOPY WITH PROPOFOL N/A 10/20/2019   Procedure: COLONOSCOPY WITH PROPOFOL;  Surgeon: Lucilla Lame, MD;  Location: [facility] ENDOSCOPY;  Service: Endoscopy;  Laterality: N/A;   ESOPHAGEAL DILATION  10/19/2019   Procedure: ESOPHAGEAL DILATION;  Surgeon: Lucilla Lame, MD;  Location: [place];  Service: Endoscopy;;   ESOPHAGOGASTRODUODENOSCOPY (EGD) WITH PROPOFOL N/A 10/19/2019   Procedure: ESOPHAGOGASTRODUODENOSCOPY (EGD) WITH PROPOFOL;  Surgeon: Lucilla Lame, MD;  Location: [place];  Service: Endoscopy;  Laterality: N/A;   PORTACATH PLACEMENT Right 11/23/2019   Procedure: INSERTION PORT-A-CATH;  Surgeon: Ronny Bacon, MD;  Location: [facility];  Service: General;  Laterality: Right;   Family History  Problem Relation Age of Onset   Cancer Mother    Heart disease Father    Breast cancer Paternal Aunt 29   Social History   Socioeconomic History   Marital status: Widowed    Spouse name: Not on file   Number of children: Not on file   Years of education: Not on file   Highest education level: Not on file  Occupational History   Occupation: retired   Tobacco Use   Smoking status: Never   Smokeless tobacco: Never  Vaping Use   Vaping Use: Never used  Substance and Sexual Activity   Alcohol use: Yes    Alcohol/week: 2.0 standard drinks    Types: 2 Standard drinks or equivalent per week    Comment: only on weeks not having treatment   Drug use: No   Sexual activity: Not Currently  Other Topics Concern   Not on file  Social History Narrative   Not on file   Social Determinants of Health    Financial Resource Strain: Low Risk    Difficulty of Paying Living Expenses: Not hard at all  Food Insecurity: No Food Insecurity   Worried About Charity fundraiser in the Last Year: Never true   Ran Out of Food in the Last Year: Never true  Transportation Needs: No Transportation Needs   Lack of Transportation (Medical): No   Lack of Transportation (Non-Medical): No  Physical Activity: Insufficiently Active   Days of Exercise per Week: 4 days   Minutes of Exercise per Session: 30 min  Stress: No Stress Concern Present   Feeling of Stress : Not at all  Social Connections: Moderately Isolated   Frequency of Communication with Friends and Family: More than three times a week  Frequency of Social Gatherings with Friends and Family: More than three times a week   Attends Religious Services: More than 4 times per year   Active Member of Clubs or Organizations: No   Attends Archivist Meetings: Never   Marital Status: Widowed    Tobacco Counseling Counseling given: No   Clinical Intake:  Pre-visit preparation completed: Yes  Pain : No/denies pain     Nutritional Risks: None Diabetes: No  How often do you need to have someone help you when you read instructions, pamphlets, or other written materials from your doctor or pharmacy?: 1 - Never  Diabetic?  no  Interpreter Needed?: No  Information entered by :: Leroy Kennedy LPN   Activities of Daily Living In your present state of health, do you have any difficulty performing the following activities: 08/21/2021  Hearing? N  Vision? N  Difficulty concentrating or making decisions? N  Walking or climbing stairs? N  Dressing or bathing? N  Doing errands, shopping? N  Preparing Food and eating ? N  Using the Toilet? N  In the past six months, have you accidently leaked urine? N  Do you have problems with loss of bowel control? N  Managing your Medications? N  Managing your Finances? N  Housekeeping or managing  your Housekeeping? N  Some recent data might be hidden    Patient Care Team: Valerie Roys, DO as PCP - General (Family Medicine) Guadalupe Maple, MD as PCP - Family Medicine (Family Medicine) Ronny Bacon, MD as Consulting Physician (General Surgery) Lloyd Huger, MD as Consulting Physician (Oncology)  Indicate any recent Medical Services you may have received from other than [facility] providers in the past year (date may be approximate).     Assessment:   This is a routine wellness examination for Maria Davies.  Hearing/Vision screen Hearing Screening - Comments:: No trouble hearing Vision Screening - Comments:: Cataract surgery Not up to date [facility]  Dietary issues and exercise activities discussed: Current Exercise Habits: Home exercise routine, Type of exercise: walking;stretching, Time (Minutes): 25, Frequency (Times/Week): 4, Weekly Exercise (Minutes/Week): 100, Intensity: Mild   Goals Addressed             This Visit's Progress    Weight (lb) < 200 lb (90.7 kg)       Would like to loose 10 pounds       Depression Screen PHQ 2/9 Scores 08/21/2021 06/05/2021 05/08/2021 02/13/2021 08/19/2020 08/15/2020 08/05/2019  PHQ - 2 Score 0 0 0 0 0 0 0  PHQ- 9 Score - 0 0 - - - -    Fall Risk Fall Risk  08/21/2021 02/13/2021 08/19/2020 08/08/2020 08/02/2020  Falls in the past year? 1 0 1 0 1  Comment - - tripped over cat - -  Number falls in past yr: 1 0 0 - -  Injury with Fall? 0 0 0 - -  Risk for fall due to : - No Fall Risks Medication side effect - -  Follow up Falls evaluation completed;Falls prevention discussed Falls evaluation completed Falls evaluation completed;Education provided;Falls prevention discussed - -    FALL RISK PREVENTION PERTAINING TO THE HOME:  Any stairs in or around the home? No  If so, are there any without handrails? Yes  Home free of loose throw rugs in walkways, pet beds, electrical cords, etc? Yes  Adequate lighting in your home to reduce risk of  falls? Yes   ASSISTIVE DEVICES UTILIZED TO PREVENT FALLS:  Life  alert? No  Use of a cane, walker or w/c? No  Grab bars in the bathroom? No  Shower chair or bench in shower? Yes  Elevated toilet seat or a handicapped toilet? Yes   TIMED UP AND GO:  Was the test performed? No .    Cognitive Function:  Normal cognitive status assessed by direct observation by this Nurse Health Advisor. No abnormalities found.       6CIT Screen 08/19/2020  What Year? 0 points  What month? 0 points  What time? 0 points  Count back from 20 0 points  Months in reverse 0 points  Repeat phrase 4 points  Total Score 4    Immunizations Immunization History  Administered Date(s) Administered   Fluad Quad(high Dose 65+) 02/18/2019, 04/18/2020   Influenza, High Dose Seasonal PF 04/23/2016, 03/25/2017   Influenza,inj,Quad PF,6+ Mos 04/11/2015   Influenza-Unspecified 04/01/2018, 05/19/2020   PFIZER(Purple Top)SARS-COV-2 Vaccination 06/25/2019, 07/16/2019, 05/25/2020   Pneumococcal Conjugate-13 04/26/2014   Pneumococcal Polysaccharide-23 07/03/2016   Td 02/13/2021   Tdap 02/10/2009   Zoster Recombinat (Shingrix) 02/18/2018, 05/09/2018   Zoster, Live 10/24/2006    TDAP status: Up to date  Flu Vaccine status: Declined, Education has been provided regarding the importance of this vaccine but patient still declined. Advised may receive this vaccine at local pharmacy or Health Dept. Aware to provide a copy of the vaccination record if obtained from local pharmacy or Health Dept. Verbalized acceptance and understanding.  Pneumococcal vaccine status: Up to date  Covid-19 vaccine status: Declined, Education has been provided regarding the importance of this vaccine but patient still declined. Advised may receive this vaccine at local pharmacy or Health Dept.or vaccine clinic. Aware to provide a copy of the vaccination record if obtained from local pharmacy or Health Dept. Verbalized acceptance and  understanding.  Qualifies for Shingles Vaccine? No   Zostavax completed Yes   Shingrix Completed?: Yes  Screening Tests Health Maintenance  Topic Date Due   COVID-19 Vaccine (4 - Booster for Pfizer series) 09/06/2021 (Originally 07/20/2020)   TETANUS/TDAP  02/14/2031   Pneumonia Vaccine 59+ Years old  Completed   DEXA SCAN  Completed   Hepatitis C Screening  Completed   Zoster Vaccines- Shingrix  Completed   HPV VACCINES  Aged Out   INFLUENZA VACCINE  Discontinued    Health Maintenance  There are no preventive care reminders to display for this patient.   Colorectal cancer screening: Type of screening: Colonoscopy. Completed  . Repeat every history of colon cancer oncologist will order due this year years  Mammogram status: Completed  . Repeat every year  Bone Density status: Completed 2015. Results reflect: Bone density results: NORMAL. Repeat every 0 years.  Lung Cancer Screening: (Low Dose CT Chest recommended if Age 42-80 years, 30 pack-year currently smoking OR have quit w/in 15years.) does not qualify.   Lung Cancer Screening Referral:   Additional Screening:  Hepatitis C Screening: does not qualify; Completed 2022  Vision Screening: Recommended annual ophthalmology exams for early detection of glaucoma and other disorders of the eye. Is the patient up to date with their annual eye exam?  No  Who is the provider or what is the name of the office in which the patient attends annual eye exams? [facility] If pt is not established with a provider, would they like to be referred to a provider to establish care? No .   Dental Screening: Recommended annual dental exams for proper oral hygiene  Community Resource Referral / Chronic  Care Management: CRR required this visit?  No   CCM required this visit?  No      Plan:     I have personally reviewed and noted the following in the patients chart:   Medical and social history Use of alcohol, tobacco or illicit drugs   Current medications and supplements including opioid prescriptions.  Functional ability and status Nutritional status Physical activity Advanced directives List of other physicians Hospitalizations, surgeries, and ER visits in previous 12 months Vitals Screenings to include cognitive, depression, and falls Referrals and appointments  In addition, I have reviewed and discussed with patient certain preventive protocols, quality metrics, and best practice recommendations. A written personalized care plan for preventive services as well as general preventive health recommendations were provided to patient.     Leroy Kennedy, LPN   08/18/9922   Nurse Notes:

## 2021-08-21 NOTE — Patient Instructions (Signed)
[name] , Thank you for taking time to come for your Medicare Wellness Visit. I appreciate your ongoing commitment to your health goals. Please review the following plan we discussed and let me know if I can assist you in the future.  Screening recommendations/referrals: Colonoscopy: up to date Mammogram: up to date Bone Density: up to date Recommended yearly ophthalmology/optometry visit for glaucoma screening and checkup Recommended yearly dental visit for hygiene and checkup  Vaccinations: Influenza vaccine:  Pneumococcal vaccine: up to date Tdap vaccine: up to date Shingles vaccine: up to date    Advanced directives: not on file   Conditions/risks identified:   Next appointment: 11-03-2021 @ 9:00 [facility] 65 Years and Older, Female Preventive care refers to lifestyle choices and visits with your health care provider that can promote health and wellness. What does preventive care include? A yearly physical exam. This is also called an annual well check. Dental exams once or twice a year. Routine eye exams. Ask your health care provider how often you should have your eyes checked. Personal lifestyle choices, including: Daily care of your teeth and gums. Regular physical activity. Eating a healthy diet. Avoiding tobacco and drug use. Limiting alcohol use. Practicing safe sex. Taking low-dose aspirin every day. Taking vitamin and mineral supplements as recommended by your health care provider. What happens during an annual well check? The services and screenings done by your health care provider during your annual well check will depend on your age, overall health, lifestyle risk factors, and family history of disease. Counseling  Your health care provider may ask you questions about your: Alcohol use. Tobacco use. Drug use. Emotional well-being. Home and relationship well-being. Sexual activity. Eating habits. History of falls. Memory and  ability to understand (cognition). Work and work Statistician. Reproductive health. Screening  You may have the following tests or measurements: Height, weight, and BMI. Blood pressure. Lipid and cholesterol levels. These may be checked every 5 years, or more frequently if you are over 49 years old. Skin check. Lung cancer screening. You may have this screening every year starting at age 69 if you have a 30-pack-year history of smoking and currently smoke or have quit within the past 15 years. Fecal occult blood test (FOBT) of the stool. You may have this test every year starting at age 32. Flexible sigmoidoscopy or colonoscopy. You may have a sigmoidoscopy every 5 years or a colonoscopy every 10 years starting at age 32. Hepatitis C blood test. Hepatitis B blood test. Sexually transmitted disease (STD) testing. Diabetes screening. This is done by checking your blood sugar (glucose) after you have not eaten for a while (fasting). You may have this done every 1-3 years. Bone density scan. This is done to screen for osteoporosis. You may have this done starting at age 3. Mammogram. This may be done every 1-2 years. Talk to your health care provider about how often you should have regular mammograms. Talk with your health care provider about your test results, treatment options, and if necessary, the need for more tests. Vaccines  Your health care provider may recommend certain vaccines, such as: Influenza vaccine. This is recommended every year. Tetanus, diphtheria, and acellular pertussis (Tdap, Td) vaccine. You may need a Td booster every 10 years. Zoster vaccine. You may need this after age 11. Pneumococcal 13-valent conjugate (PCV13) vaccine. One dose is recommended after age 79. Pneumococcal polysaccharide (PPSV23) vaccine. One dose is recommended after age 53. Talk to your health care provider  about which screenings and vaccines you need and how often you need them. This information is  not intended to replace advice given to you by your health care provider. Make sure you discuss any questions you have with your health care provider. Document Released: 07/01/2015 Document Revised: 02/22/2016 Document Reviewed: 04/05/2015 Elsevier Interactive Patient Education  2017 [place] Prevention in the Home Falls can cause injuries. They can happen to people of all ages. There are many things you can do to make your home safe and to help prevent falls. What can I do on the outside of my home? Regularly fix the edges of walkways and driveways and fix any cracks. Remove anything that might make you trip as you walk through a door, such as a raised step or threshold. Trim any bushes or trees on the path to your home. Use bright outdoor lighting. Clear any walking paths of anything that might make someone trip, such as rocks or tools. Regularly check to see if handrails are loose or broken. Make sure that both sides of any steps have handrails. Any raised decks and porches should have guardrails on the edges. Have any leaves, snow, or ice cleared regularly. Use sand or salt on walking paths during winter. Clean up any spills in your garage right away. This includes oil or grease spills. What can I do in the bathroom? Use night lights. Install grab bars by the toilet and in the tub and shower. Do not use towel bars as grab bars. Use non-skid mats or decals in the tub or shower. If you need to sit down in the shower, use a plastic, non-slip stool. Keep the floor dry. Clean up any water that spills on the floor as soon as it happens. Remove soap buildup in the tub or shower regularly. Attach bath mats securely with double-sided non-slip rug tape. Do not have throw rugs and other things on the floor that can make you trip. What can I do in the bedroom? Use night lights. Make sure that you have a light by your bed that is easy to reach. Do not use any sheets or blankets that  are too big for your bed. They should not hang down onto the floor. Have a firm chair that has side arms. You can use this for support while you get dressed. Do not have throw rugs and other things on the floor that can make you trip. What can I do in the kitchen? Clean up any spills right away. Avoid walking on wet floors. Keep items that you use a lot in easy-to-reach places. If you need to reach something above you, use a strong step stool that has a grab bar. Keep electrical cords out of the way. Do not use floor polish or wax that makes floors slippery. If you must use wax, use non-skid floor wax. Do not have throw rugs and other things on the floor that can make you trip. What can I do with my stairs? Do not leave any items on the stairs. Make sure that there are handrails on both sides of the stairs and use them. Fix handrails that are broken or loose. Make sure that handrails are as long as the stairways. Check any carpeting to make sure that it is firmly attached to the stairs. Fix any carpet that is loose or worn. Avoid having throw rugs at the top or bottom of the stairs. If you do have throw rugs, attach them to the floor  with carpet tape. Make sure that you have a light switch at the top of the stairs and the bottom of the stairs. If you do not have them, ask someone to add them for you. What else can I do to help prevent falls? Wear shoes that: Do not have high heels. Have rubber bottoms. Are comfortable and fit you well. Are closed at the toe. Do not wear sandals. If you use a stepladder: Make sure that it is fully opened. Do not climb a closed stepladder. Make sure that both sides of the stepladder are locked into place. Ask someone to hold it for you, if possible. Clearly mark and make sure that you can see: Any grab bars or handrails. First and last steps. Where the edge of each step is. Use tools that help you move around (mobility aids) if they are needed. These  include: Canes. Walkers. Scooters. Crutches. Turn on the lights when you go into a dark area. Replace any light bulbs as soon as they burn out. Set up your furniture so you have a clear path. Avoid moving your furniture around. If any of your floors are uneven, fix them. If there are any pets around you, be aware of where they are. Review your medicines with your doctor. Some medicines can make you feel dizzy. This can increase your chance of falling. Ask your doctor what other things that you can do to help prevent falls. This information is not intended to replace advice given to you by your health care provider. Make sure you discuss any questions you have with your health care provider. Document Released: 03/31/2009 Document Revised: 11/10/2015 Document Reviewed: 07/09/2014 Elsevier Interactive Patient Education  2017 [employer].

## 2021-08-26 ENCOUNTER — Other Ambulatory Visit: Payer: Self-pay | Admitting: Family Medicine

## 2021-08-28 NOTE — Telephone Encounter (Signed)
Requested Prescriptions  ?Pending Prescriptions Disp Refills  ?? benazepril (LOTENSIN) 40 MG tablet [Pharmacy Med Name: BENAZEPRIL HCL 40 MG TAB] 90 tablet   ?  Sig: TAKE 1 TABLET BY MOUTH ONCE DAILY  ?  ? Cardiovascular:  ACE Inhibitors Failed - 08/26/2021 10:20 AM  ?  ?  Failed - Cr in normal range and within 180 days  ?  Creatinine  ?Date Value Ref Range Status  ?08/15/2012 0.65 0.60 - 1.30 mg/dL Final  ? ?Creatinine, Ser  ?Date Value Ref Range Status  ?06/05/2021 0.39 (L) 0.57 - 1.00 mg/dL Final  ?   ?  ?  Passed - K in normal range and within 180 days  ?  Potassium  ?Date Value Ref Range Status  ?06/05/2021 3.6 3.5 - 5.2 mmol/L Final  ?08/15/2012 4.3 3.5 - 5.1 mmol/L Final  ?   ?  ?  Passed - Patient is not pregnant  ?  ?  Passed - Last BP in normal range  ?  BP Readings from Last 1 Encounters:  ?08/16/21 134/69  ?   ?  ?  Passed - Valid encounter within last 6 months  ?  Recent Outpatient Visits   ?      ? 2 weeks ago Hypertension, unspecified type  ? [place] P, DO  ? 2 months ago Hypertension, unspecified type  ? [place], [place] P, DO  ? 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 [place] P, DO  ? 6 months ago Hypertension, unspecified type  ? [place], Megan P, DO  ? 7 months ago COVID-19  ? [place], [place] P, DO  ?  ?  ?Future Appointments   ?        ? In 2 months Wynetta Emery, Barb Merino, DO [facility]  ?  ? ?  ?  ?  ? ? ?

## 2021-08-30 ENCOUNTER — Emergency Department: Payer: Medicare Other

## 2021-08-30 ENCOUNTER — Other Ambulatory Visit: Payer: Self-pay

## 2021-08-30 ENCOUNTER — Emergency Department
Admission: EM | Admit: 2021-08-30 | Discharge: 2021-08-30 | Disposition: A | Discharge status: Home / Self Care | Payer: Medicare Other | Attending: Emergency Medicine | Admitting: Emergency Medicine

## 2021-08-30 DIAGNOSIS — S42222A 2-part displaced fracture of surgical neck of left humerus, initial encounter for closed fracture: Secondary | ICD-10-CM | POA: Insufficient documentation

## 2021-08-30 DIAGNOSIS — M25512 Pain in left shoulder: Secondary | ICD-10-CM | POA: Diagnosis not present

## 2021-08-30 DIAGNOSIS — S4992XA Unspecified injury of left shoulder and upper arm, initial encounter: Secondary | ICD-10-CM | POA: Diagnosis present

## 2021-08-30 DIAGNOSIS — I10 Essential (primary) hypertension: Secondary | ICD-10-CM | POA: Diagnosis not present

## 2021-08-30 DIAGNOSIS — E039 Hypothyroidism, unspecified: Secondary | ICD-10-CM | POA: Diagnosis not present

## 2021-08-30 DIAGNOSIS — S42292A Other displaced fracture of upper end of left humerus, initial encounter for closed fracture: Secondary | ICD-10-CM

## 2021-08-30 DIAGNOSIS — S42212A Unspecified displaced fracture of surgical neck of left humerus, initial encounter for closed fracture: Secondary | ICD-10-CM | POA: Diagnosis not present

## 2021-08-30 DIAGNOSIS — Y92009 Unspecified place in unspecified non-institutional (private) residence as the place of occurrence of the external cause: Secondary | ICD-10-CM | POA: Diagnosis not present

## 2021-08-30 DIAGNOSIS — Y9301 Activity, walking, marching and hiking: Secondary | ICD-10-CM | POA: Insufficient documentation

## 2021-08-30 DIAGNOSIS — W010XXA Fall on same level from slipping, tripping and stumbling without subsequent striking against object, initial encounter: Secondary | ICD-10-CM | POA: Insufficient documentation

## 2021-08-30 IMAGING — CR DG SHOULDER 2+V*L*
3 series · 3 of 3 positions shown · non-contrast
Comparison: None.

CLINICAL DATA: Left shoulder pain status post fall

EXAM:
LEFT SHOULDER - 2+ VIEW

[shoulder grashey]
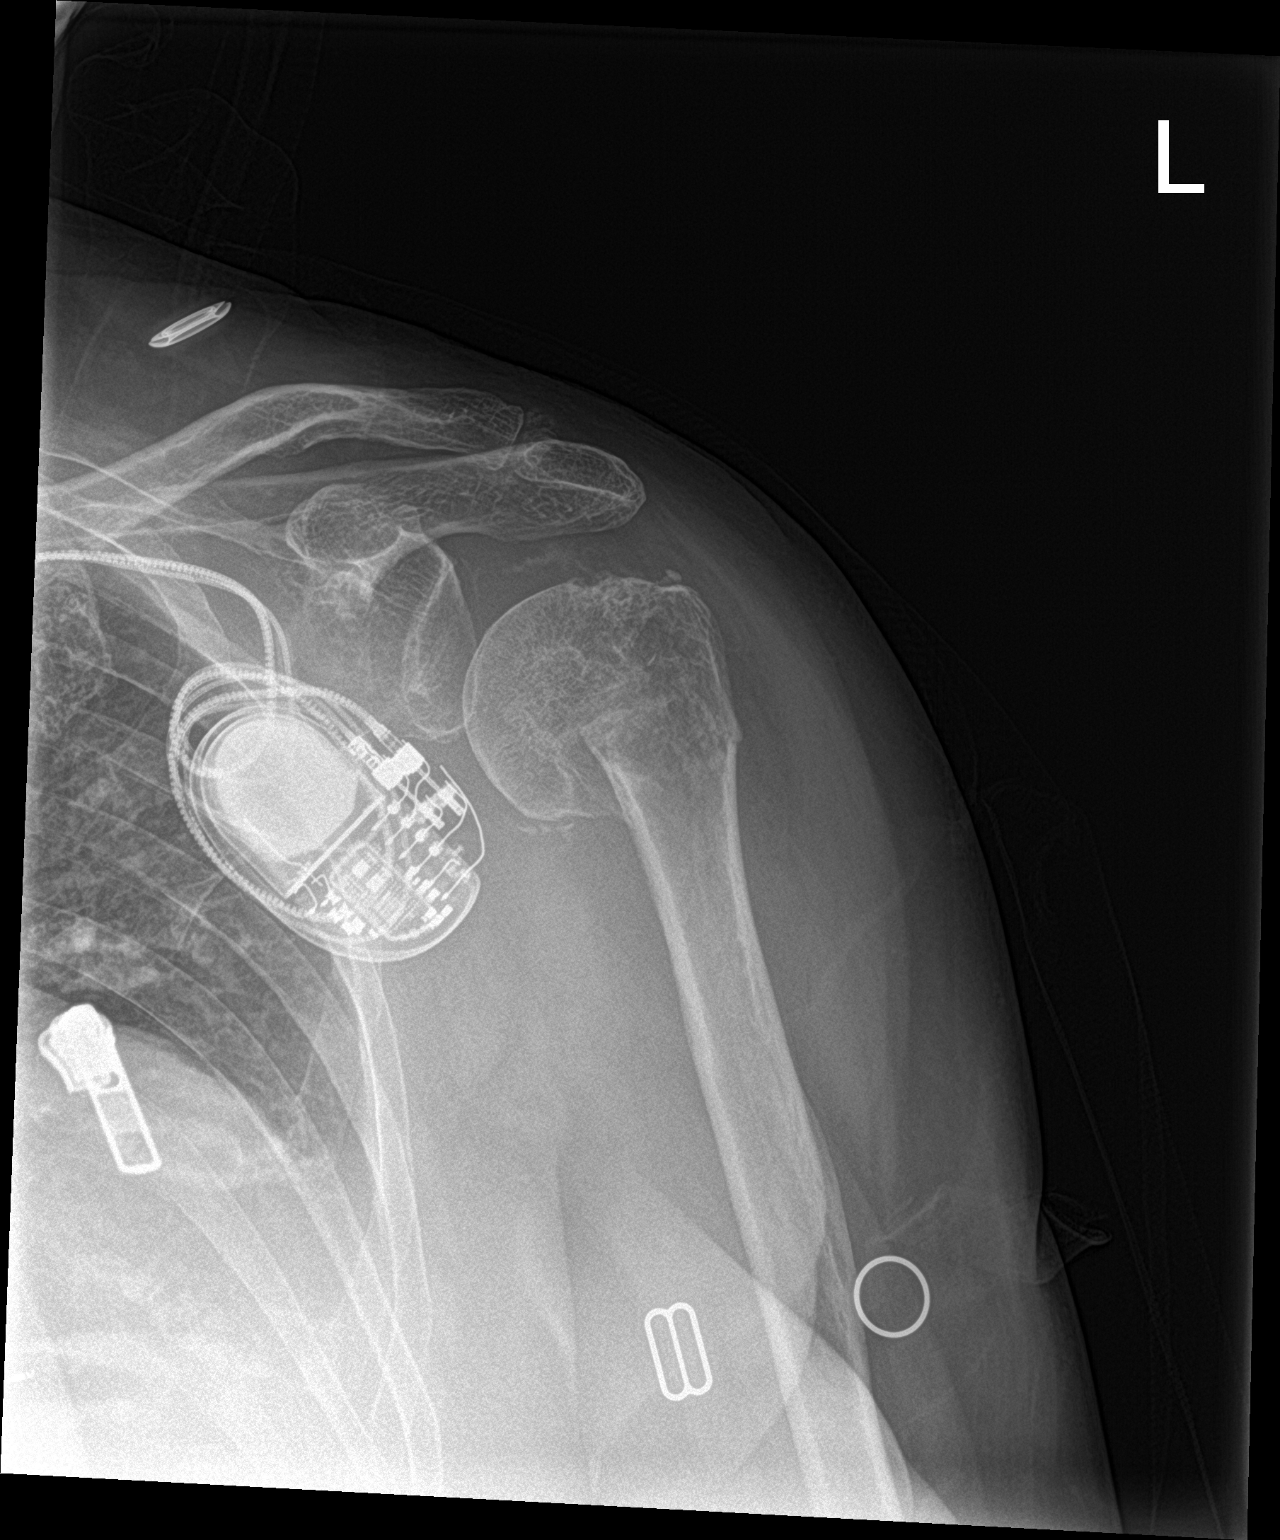

[shoulder y view]
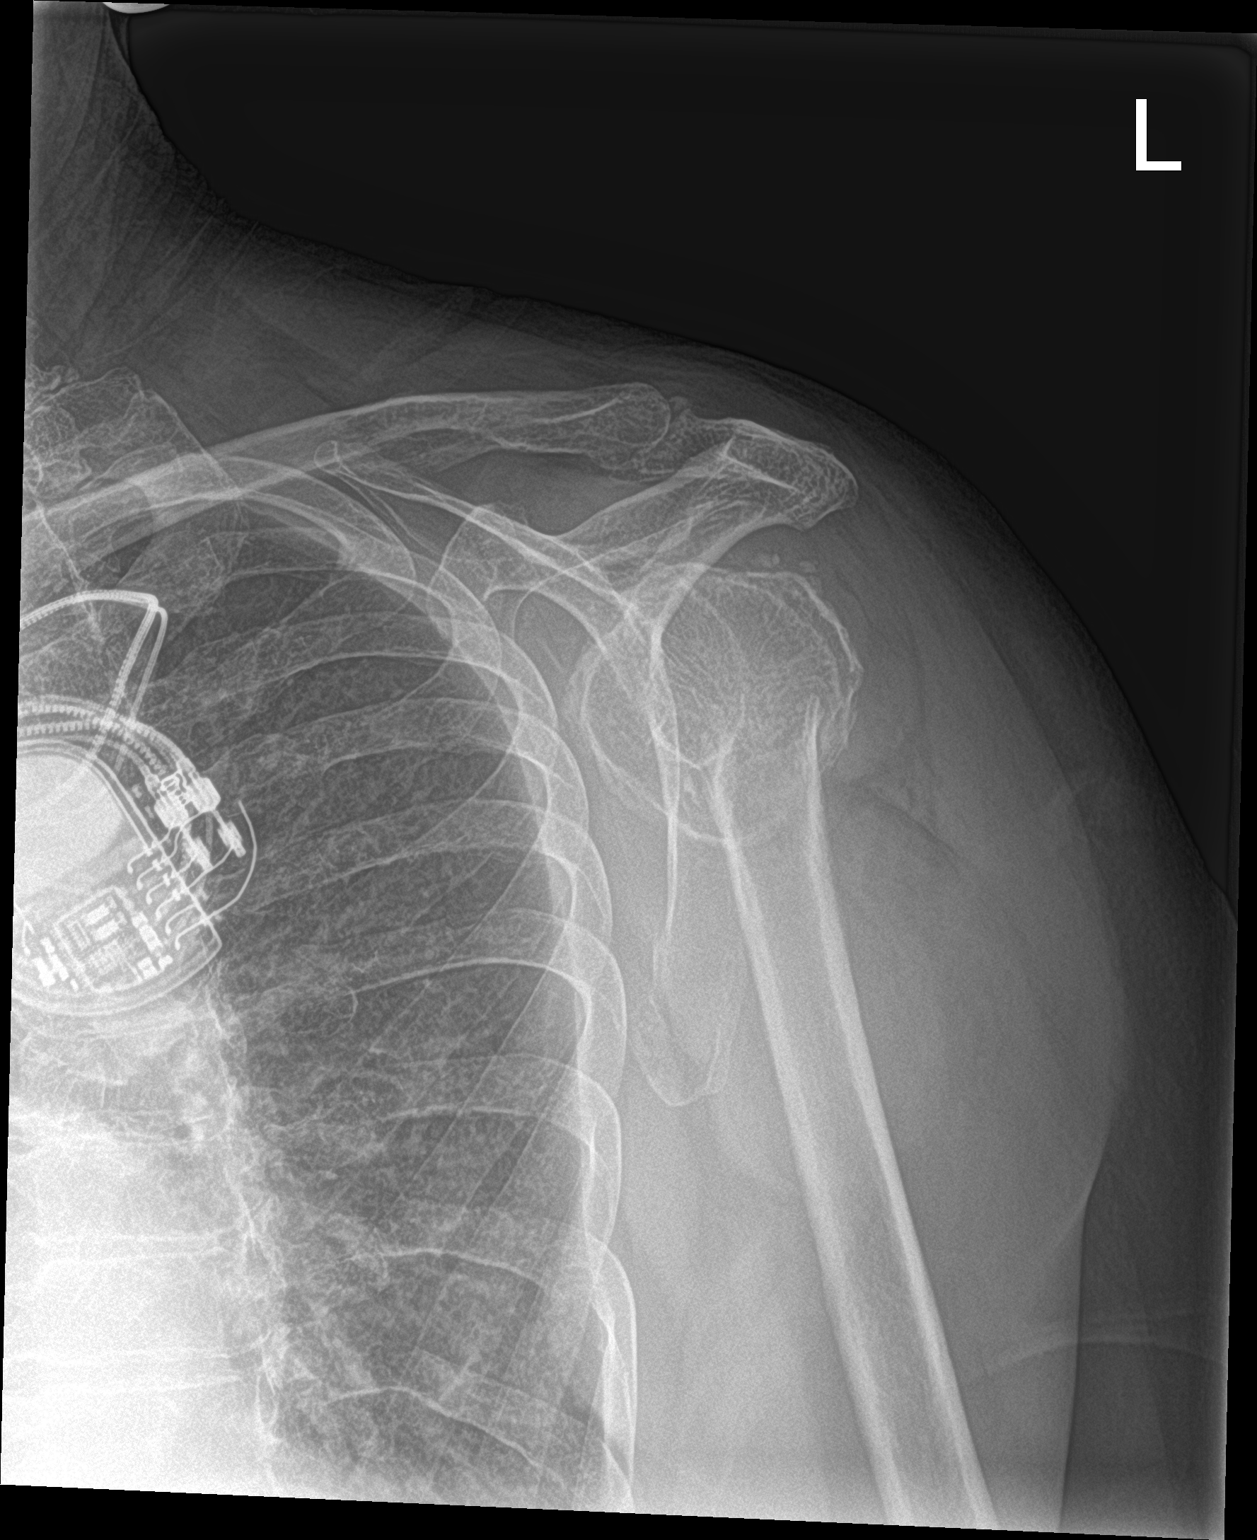

[shoulder ap neutral]
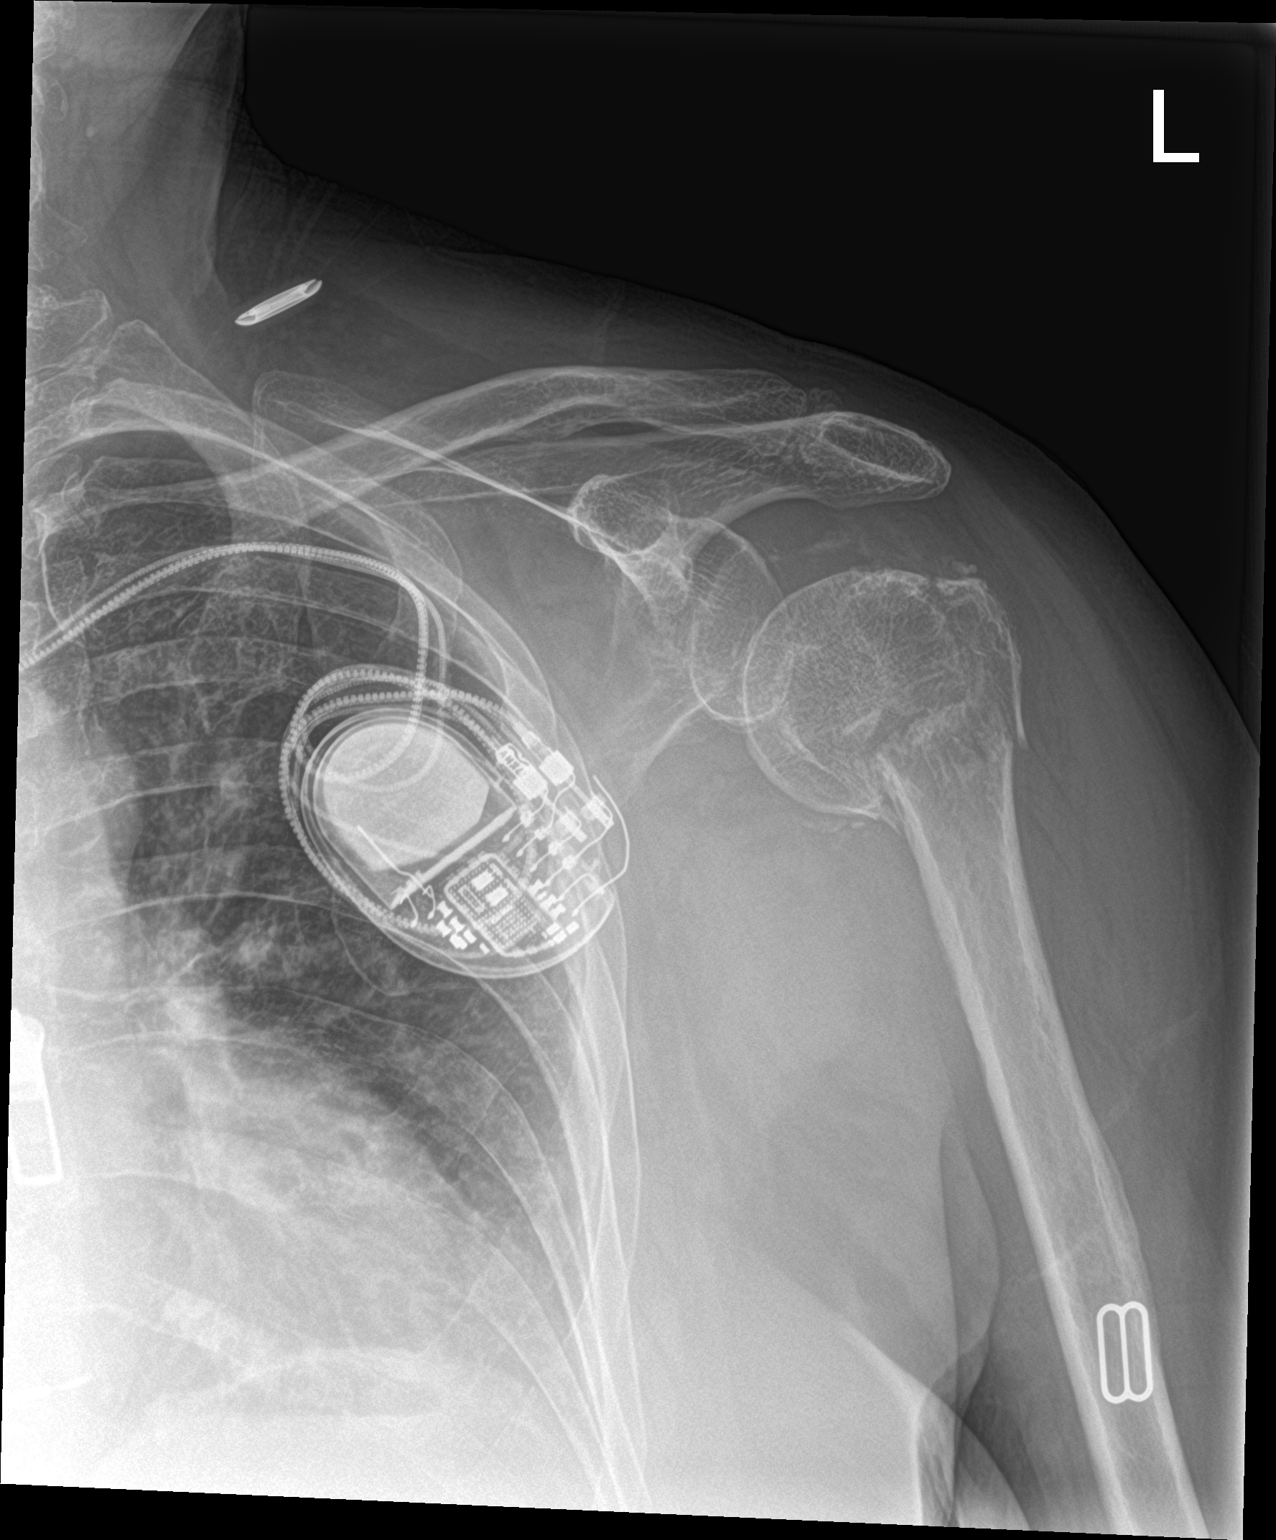

[3 of 3 positions shown; findings below may reference images not displayed]

FINDINGS: Left chest wall pacemaker partially visualized.

Mildly impacted and angulated fracture of the left proximal humerus,
which extends through the greater tuberosity.

Irregularity of the greater tuberosity with adjacent calcifications
suggestive of calcific rotator cuff tendinosis.
IMPRESSION: Mildly impacted and angulated fracture of the left humeral neck and
head.

## 2021-08-30 MED ORDER — TRAMADOL HCL 50 MG PO TABS: 50.0000 mg | ORAL_TABLET | Freq: Four times a day (QID) | ORAL | 0 refills | Status: DC | PRN

## 2021-08-30 MED ORDER — OXYCODONE HCL 5 MG PO TABS
5.0000 mg | ORAL_TABLET | Freq: Once | ORAL | Status: AC
  Administered 2021-08-30: 5 mg via ORAL
  Filled 2021-08-30: qty 1

## 2021-08-30 NOTE — ED Provider Notes (Signed)
? ?[facility] ?Provider Note ? ? ? Event Date/Time  ? First MD Initiated Contact with Patient 08/30/21 2047   ?  (approximate) ? ? ?History  ? ?Fall and Shoulder Pain ? ? ?HPI ? ?Maria Davies is a 81 y.o. female with past medical history of hypertension, hypothyroidism, osteoporosis, and as listed in EMR presents to the emergency department for treatment and evaluation after a mechanical, nonsyncopal fall at home earlier today.  Her foot got caught on the carpet and she tripped.  She fell onto her left shoulder.  Pain in the left shoulder with difficulty with range of motion since.  She denies loss of consciousness or striking her head.  She is not currently on blood thinners. ? ?  ? ? ?Physical Exam  ? ?Triage Vital Signs: ?ED Triage Vitals  ?Enc Vitals Group  ?   BP 08/30/21 1818 133/88  ?   Pulse Rate 08/30/21 1818 92  ?   Resp 08/30/21 1818 16  ?   Temp 08/30/21 1818 98.2 ?F (36.8 ?C)  ?   Temp Source 08/30/21 1818 Oral  ?   SpO2 08/30/21 1818 96 %  ?   Weight 08/30/21 1835 170 lb (77.1 kg)  ?   Height 08/30/21 1835 '5\' 3"'$  (1.6 m)  ?   Head Circumference --   ?   Peak Flow --   ?   Pain Score 08/30/21 1835 10  ?   Pain Loc --   ?   Pain Edu? --   ?   Excl. in [place]? --   ? ? ?Most recent vital signs: ?Vitals:  ? 08/30/21 1818  ?BP: 133/88  ?Pulse: 92  ?Resp: 16  ?Temp: 98.2 ?F (36.8 ?C)  ?SpO2: 96%  ? ? ?General: Awake, no distress.  ?CV:  Good peripheral perfusion.  ?Resp:  Normal effort.  ?Abd:  No distention.  ?Other:  Pain over left shoulder/proximal humerus without open wound. No pain in elbow or wrist. FROM of fingers of left hand.  ? ? ?ED Results / Procedures / Treatments  ? ?Labs ?(all labs ordered are listed, but only abnormal results are displayed) ?Labs Reviewed - No data to display ? ? ?EKG ? ?Not indicated. ? ? ?RADIOLOGY ? ?Image and radiology report reviewed by me. ? ?Impacted and angulated left proximal numerous at the surgical neck with avulsion fracture off the humeral  head. ? ?PROCEDURES: ? ?Critical Care performed: No ? ?Procedures ? ? ?MEDICATIONS ORDERED IN ED: ?Medications  ?oxyCODONE (Oxy IR/ROXICODONE) immediate release tablet 5 mg (5 mg Oral Given 08/30/21 2109)  ? ? ? ?IMPRESSION / MDM / ASSESSMENT AND PLAN / ED COURSE  ? ?I have reviewed the triage note. ? ?Differential diagnosis includes, but is not limited to, shoulder dislocation, proximal humerus fracture ? ?81 year old female presenting to the emergency department after mechanical, nonsyncopal fall at home.  See HPI for further details. ? ?Image shows a comminuted slightly angulated proximal humerus fracture with an avulsion fracture of the humeral head.  Results discussed with the patient and family.  She will be treated tonight with a sling and single dose of Roxicodone.  Left arm without edema.  Patient is to call and schedule follow-up appointment with orthopedics.  Tramadol was prescribed for pain.  She is encouraged to return to the emergency department for symptoms of concern if unable to see primary care or orthopedics. ? ?  ? ? ?FINAL CLINICAL IMPRESSION(S) / ED DIAGNOSES  ? ?Final diagnoses:  ?  Closed 2-part displaced fracture of surgical neck of left humerus, initial encounter  ?Humeral head fracture, left, closed, initial encounter  ? ? ? ?Rx / DC Orders  ? ?ED Discharge Orders   ? ?      Ordered  ?  traMADol (ULTRAM) 50 MG tablet  Every 6 hours PRN       ? 08/30/21 2110  ? ?  ?  ? ?  ? ? ? ?Note:  This document was prepared using Dragon voice recognition software and may include unintentional dictation errors. ?  ?Victorino Dike, FNP ?08/30/21 2120 ? ?  ?Nance Pear, MD ?08/30/21 2231 ? ?

## 2021-08-30 NOTE — ED Triage Notes (Signed)
Pt presents to ER after fall that happened around 1630 today when she was walking from carpet onto stone floor, when her foot was caught and she tripped and fell onto her left shoulder.  Pt states she is having trouble moving her shoulder without extreme pain.  Pt otherwise A&O x4 at this time.  Pt not on blood thinners and denies LOC.   ?

## 2021-08-30 NOTE — Discharge Instructions (Addendum)
Please call and schedule a follow up with Dr. Roland Rack. ?Wear the sling at all times. ?Ice 20 minutes per hour while awake.  ?Return to the ER for symptoms of concern if unable to see primary care or the orthopedic specialist. ?

## 2021-09-04 ENCOUNTER — Telehealth: Payer: Self-pay | Admitting: *Deleted

## 2021-09-04 NOTE — Telephone Encounter (Signed)
Transition Care Management Follow-up Telephone Call ?Date of discharge and from where: [facility] 08-30-2021 ?How have you been since you were released from the hospital? Feeling ok hand is swelling a little bit  ?Any questions or concerns? Yes ?Has some swelling, she did not have this when she left hospital patient is concerned.  ?Has called orthopedics they are unable to see her unitl Thursday.   Patient is going to call [place] office to see if she can be seen.  She does not want to go back to the ED. ? ?Items Reviewed: ?Did the pt receive and understand the discharge instructions provided? Yes  ?Medications obtained and verified? Yes  ?Other? No  ?Any new allergies since your discharge? No  ?Dietary orders reviewed? No ?Do you have support at home? Yes  ? ?Home Care and Equipment/Supplies: ?Were home health services ordered? not applicable ?If so, what is the name of the agency?   ?Has the agency set up a time to come to the patient's home? not applicable ?Were any new equipment or medical supplies ordered?  No ?What is the name of the medical supply agency?  ?Were you able to get the supplies/equipment? not applicable ?Do you have any questions related to the use of the equipment or supplies? No ? ?Functional Questionnaire: (I = Independent and D = Dependent) ?ADLs: I ? ?Bathing/Dressing- I ? ?Meal Prep- I ? ?Eating- I ? ?Maintaining continence- I ? ?Transferring/Ambulation- I ? ?Managing Meds- I ? ?Follow up appointments reviewed: ? ?PCP Hospital f/u appt confirmed?  PATIENT is going to call   Scheduled to  ?[facility] f/u appt confirmed? Yes  Scheduled to see [phone]  Orthopedics  ?Are transportation arrangements needed? No  ?If their condition worsens, is the pt aware to call PCP or go to the Emergency Dept.? Yes ?Was the patient provided with contact information for the PCP's office or ED? Yes ?Was to pt encouraged to call back with questions or concerns? Yes  ?

## 2021-09-05 ENCOUNTER — Other Ambulatory Visit: Payer: Self-pay

## 2021-09-05 ENCOUNTER — Encounter: Payer: Self-pay | Admitting: Internal Medicine

## 2021-09-05 ENCOUNTER — Ambulatory Visit (INDEPENDENT_AMBULATORY_CARE_PROVIDER_SITE_OTHER): Payer: Medicare Other | Admitting: Internal Medicine

## 2021-09-05 VITALS — HR 100 | Temp 97.6°F | Wt 182.0 lb

## 2021-09-05 DIAGNOSIS — R609 Edema, unspecified: Secondary | ICD-10-CM

## 2021-09-05 DIAGNOSIS — W19XXXD Unspecified fall, subsequent encounter: Secondary | ICD-10-CM | POA: Diagnosis not present

## 2021-09-05 DIAGNOSIS — S42202A Unspecified fracture of upper end of left humerus, initial encounter for closed fracture: Secondary | ICD-10-CM | POA: Diagnosis not present

## 2021-09-05 NOTE — Progress Notes (Signed)
? ?Pulse 100   Temp 97.6 ?F (36.4 ?C)   Wt 182 lb (82.6 kg)   SpO2 98%   BMI 32.24 kg/m?   ? ?Subjective:  ? ? Patient ID: Maria Davies, female    DOB: 1941/05/28, 81 y.o.   MRN: [id number] ? ?Chief Complaint  ?Patient presents with  ? Fall  ?  Patient states she fell last Wednesday and fractured her arm. Patient states she is having swelling in her left hand. Patient sees [facility] ortho tomorrow.   ? ? ?HPI: ?Maria Davies is a 81 y.o. female ? ?Fall ?Incident onset: to see ortho on thursday ortho, has had a swelling. Fall occurred: no pain per pt. humerus is broken and pt was seen in the er for such. Pertinent negatives include no abdominal pain, fever, headaches, hematuria, nausea, numbness or vomiting.  ? ?Chief Complaint  ?Patient presents with  ? Fall  ?  Patient states she fell last Wednesday and fractured her arm. Patient states she is having swelling in her left hand. Patient sees [facility] ortho tomorrow.   ? ? ?Relevant past medical, surgical, family and social history reviewed and updated as indicated. Interim medical history since our last visit reviewed. ?Allergies and medications reviewed and updated. ? ?Review of Systems  ?Constitutional:  Negative for activity change, appetite change, chills, fatigue and fever.  ?HENT:  Negative for congestion, ear discharge, ear pain and facial swelling.   ?Eyes:  Negative for pain and itching.  ?Respiratory:  Negative for cough, chest tightness, shortness of breath and wheezing.   ?Cardiovascular:  Negative for chest pain, palpitations and leg swelling.  ?Gastrointestinal:  Negative for abdominal distention, abdominal pain, blood in stool, constipation, diarrhea, nausea and vomiting.  ?Endocrine: Negative for cold intolerance, heat intolerance, polydipsia, polyphagia and polyuria.  ?Genitourinary:  Negative for difficulty urinating, dysuria, flank pain, frequency, hematuria and urgency.  ?Musculoskeletal:  Negative for arthralgias, gait problem,  joint swelling and myalgias.  ?Skin:  Negative for color change, rash and wound.  ?Neurological:  Negative for dizziness, tremors, speech difficulty, weakness, light-headedness, numbness and headaches.  ?Hematological:  Does not bruise/bleed easily.  ?Psychiatric/Behavioral:  Negative for agitation, confusion, decreased concentration, sleep disturbance and suicidal ideas.   ? ?Per HPI unless specifically indicated above ? ?   ?Objective:  ?  ?Pulse 100   Temp 97.6 ?F (36.4 ?C)   Wt 182 lb (82.6 kg)   SpO2 98%   BMI 32.24 kg/m?   ?Wt Readings from Last 3 Encounters:  ?09/05/21 182 lb (82.6 kg)  ?08/30/21 170 lb (77.1 kg)  ?08/16/21 181 lb 9.6 oz (82.4 kg)  ?  ?Physical Exam ?Vitals and nursing note reviewed.  ?Constitutional:   ?   General: She is not in acute distress. ?   Appearance: She is not ill-appearing or diaphoretic.  ?Pulmonary:  ?   Breath sounds: No rhonchi.  ?Skin: ?   Coloration: Skin is not jaundiced or pale.  ?   Findings: Bruising present. No erythema, lesion or rash.  ?Neurological:  ?   Mental Status: She is alert.  ? ?Results for orders placed or performed in visit on 08/16/21  ?CBC with Differential/Platelet  ?Result Value Ref Range  ? WBC 6.4 4.0 - 10.5 K/uL  ? RBC 4.20 3.87 - 5.11 MIL/uL  ? Hemoglobin 14.2 12.0 - 15.0 g/dL  ? HCT 40.8 36.0 - 46.0 %  ? MCV 97.1 80.0 - 100.0 fL  ? MCH 33.8 26.0 - 34.0 pg  ?  MCHC 34.8 30.0 - 36.0 g/dL  ? RDW 12.6 11.5 - 15.5 %  ? Platelets 161 150 - 400 K/uL  ? nRBC 0.0 0.0 - 0.2 %  ? Neutrophils Relative % 56 %  ? Neutro Abs 3.7 1.7 - 7.7 K/uL  ? Lymphocytes Relative 30 %  ? Lymphs Abs 1.9 0.7 - 4.0 K/uL  ? Monocytes Relative 10 %  ? Monocytes Absolute 0.7 0.1 - 1.0 K/uL  ? Eosinophils Relative 2 %  ? Eosinophils Absolute 0.1 0.0 - 0.5 K/uL  ? Basophils Relative 1 %  ? Basophils Absolute 0.0 0.0 - 0.1 K/uL  ? Immature Granulocytes 1 %  ? Abs Immature Granulocytes 0.03 0.00 - 0.07 K/uL  ?CEA  ?Result Value Ref Range  ? CEA 1.6 0.0 - 4.7 ng/mL  ? ?    ? ? ?Current Outpatient Medications:  ?  acetaminophen (TYLENOL) 325 MG tablet, Take 325 mg by mouth 3 (three) times daily as needed for moderate pain or headache., Disp: , Rfl:  ?  amLODipine (NORVASC) 5 MG tablet, Take 1 tablet (5 mg total) by mouth daily., Disp: 90 tablet, Rfl: 1 ?  aspirin EC 81 MG tablet, Take 81 mg by mouth daily., Disp: , Rfl:  ?  benazepril (LOTENSIN) 20 MG tablet, Take 1 tablet (20 mg total) by mouth daily., Disp: 90 tablet, Rfl: 1 ?  lidocaine-prilocaine (EMLA) cream, Apply to affected area once, Disp: 30 g, Rfl: 3 ?  methimazole (TAPAZOLE) 5 MG tablet, Take 5 mg by mouth daily., Disp: , Rfl:  ?  metoprolol succinate (TOPROL-XL) 50 MG 24 hr tablet, Take 1 tablet (50 mg total) by mouth daily. Take with or immediately following a meal., Disp: 90 tablet, Rfl: 1 ?  Multiple Vitamin (MULTI-VITAMINS) TABS, Take 1 tablet by mouth daily. , Disp: , Rfl:  ?  Probiotic Product (PROBIOTIC ADVANCED PO), Take 1-2 capsules by mouth daily. When taking antibiotics pt will take 2 capsules instead of 1, normally just takes 1 per day otherwise, Disp: , Rfl:  ?  [place] sulfADIAZINE (SILVADENE) 1 % cream, Apply 1 application topically daily., Disp: 50 g, Rfl: 0 ?  Sodium Hyaluronate, oral, (HYALURONIC ACID PO), Take 1 tablet by mouth daily., Disp: , Rfl:  ?  vitamin B-12 (CYANOCOBALAMIN) 1000 MCG tablet, Take 1,000 mcg by mouth daily., Disp: , Rfl:  ?  ZINC CITRATE PO, Take by mouth., Disp: , Rfl:  ?No current facility-administered medications for this visit. ? ?Facility-Administered Medications Ordered in Other Visits:  ?  sodium chloride flush (NS) 0.9 % injection 10 mL, 10 mL, Intravenous, PRN, Lloyd Huger, MD, 10 mL at 03/07/20 0836 ?  sodium chloride flush (NS) 0.9 % injection 10 mL, 10 mL, Intravenous, PRN, Lloyd Huger, MD, 10 mL at 06/21/20 1259  ? ? ?Assessment & Plan:  ?LEFT HUMERUS FRACTURE: ?Will need to go to [facility] for further eval of new onset of swelling  and discoloration around the fractured site. ? ?Problem List Items Addressed This Visit   ? ?  ? Other  ? Swelling  ? Fall - Primary  ?  ? ?No orders of the defined types were placed in this encounter. ?  ? ?No orders of the defined types were placed in this encounter. ?  ? ?Follow up plan: ?No follow-ups on file. ? ? ?

## 2021-09-06 DIAGNOSIS — R609 Edema, unspecified: Secondary | ICD-10-CM | POA: Insufficient documentation

## 2021-09-06 DIAGNOSIS — W19XXXA Unspecified fall, initial encounter: Secondary | ICD-10-CM | POA: Insufficient documentation

## 2021-09-18 DIAGNOSIS — S42202A Unspecified fracture of upper end of left humerus, initial encounter for closed fracture: Secondary | ICD-10-CM | POA: Insufficient documentation

## 2021-09-18 HISTORY — DX: Unspecified fracture of upper end of left humerus, initial encounter for closed fracture: S42.202A

## 2021-09-21 DIAGNOSIS — M25561 Pain in right knee: Secondary | ICD-10-CM | POA: Diagnosis not present

## 2021-09-21 DIAGNOSIS — M13861 Other specified arthritis, right knee: Secondary | ICD-10-CM | POA: Diagnosis not present

## 2021-09-26 ENCOUNTER — Ambulatory Visit: Payer: Medicare Other | Admitting: Family Medicine

## 2021-09-27 ENCOUNTER — Ambulatory Visit: Payer: Medicare Other | Admitting: Family Medicine

## 2021-10-12 ENCOUNTER — Ambulatory Visit: Payer: Medicare Other | Admitting: Family Medicine

## 2021-10-13 DIAGNOSIS — S42202A Unspecified fracture of upper end of left humerus, initial encounter for closed fracture: Secondary | ICD-10-CM | POA: Diagnosis not present

## 2021-10-19 DIAGNOSIS — S42202A Unspecified fracture of upper end of left humerus, initial encounter for closed fracture: Secondary | ICD-10-CM | POA: Diagnosis not present

## 2021-10-24 DIAGNOSIS — S42202A Unspecified fracture of upper end of left humerus, initial encounter for closed fracture: Secondary | ICD-10-CM | POA: Diagnosis not present

## 2021-10-24 DIAGNOSIS — S42202D Unspecified fracture of upper end of left humerus, subsequent encounter for fracture with routine healing: Secondary | ICD-10-CM | POA: Diagnosis not present

## 2021-10-31 DIAGNOSIS — S42202D Unspecified fracture of upper end of left humerus, subsequent encounter for fracture with routine healing: Secondary | ICD-10-CM | POA: Diagnosis not present

## 2021-10-31 DIAGNOSIS — S42202A Unspecified fracture of upper end of left humerus, initial encounter for closed fracture: Secondary | ICD-10-CM | POA: Diagnosis not present

## 2021-11-03 ENCOUNTER — Ambulatory Visit (INDEPENDENT_AMBULATORY_CARE_PROVIDER_SITE_OTHER): Payer: Medicare Other | Admitting: Family Medicine

## 2021-11-03 ENCOUNTER — Encounter: Payer: Self-pay | Admitting: Family Medicine

## 2021-11-03 VITALS — BP 130/78 | HR 83 | Temp 97.7°F | Wt 180.0 lb

## 2021-11-03 DIAGNOSIS — D500 Iron deficiency anemia secondary to blood loss (chronic): Secondary | ICD-10-CM | POA: Diagnosis not present

## 2021-11-03 DIAGNOSIS — I700 Atherosclerosis of aorta: Secondary | ICD-10-CM | POA: Diagnosis not present

## 2021-11-03 DIAGNOSIS — I10 Essential (primary) hypertension: Secondary | ICD-10-CM | POA: Diagnosis not present

## 2021-11-03 DIAGNOSIS — C50911 Malignant neoplasm of unspecified site of right female breast: Secondary | ICD-10-CM | POA: Diagnosis not present

## 2021-11-03 DIAGNOSIS — C50211 Malignant neoplasm of upper-inner quadrant of right female breast: Secondary | ICD-10-CM | POA: Diagnosis not present

## 2021-11-03 DIAGNOSIS — E0590 Thyrotoxicosis, unspecified without thyrotoxic crisis or storm: Secondary | ICD-10-CM

## 2021-11-03 DIAGNOSIS — E039 Hypothyroidism, unspecified: Secondary | ICD-10-CM

## 2021-11-03 DIAGNOSIS — C182 Malignant neoplasm of ascending colon: Secondary | ICD-10-CM | POA: Diagnosis not present

## 2021-11-03 DIAGNOSIS — E7800 Pure hypercholesterolemia, unspecified: Secondary | ICD-10-CM | POA: Diagnosis not present

## 2021-11-03 MED ORDER — BENAZEPRIL HCL 20 MG PO TABS
20.0000 mg | ORAL_TABLET | Freq: Every day | ORAL | 1 refills | Status: DC
Start: 2021-11-03 — End: 2022-05-29

## 2021-11-03 MED ORDER — AMLODIPINE BESYLATE 5 MG PO TABS: 5.0000 mg | ORAL_TABLET | Freq: Every day | ORAL | 1 refills | Status: DC

## 2021-11-03 MED ORDER — METOPROLOL SUCCINATE ER 50 MG PO TB24: 50.0000 mg | ORAL_TABLET | Freq: Every day | ORAL | 1 refills | Status: DC

## 2021-11-03 NOTE — Assessment & Plan Note (Signed)
Will keep BP and cholesterol under good control. Continue to monitor. Call with any concerns.  

## 2021-11-03 NOTE — Assessment & Plan Note (Signed)
Continue to follow with oncology. Call with any concerns.  

## 2021-11-03 NOTE — Assessment & Plan Note (Signed)
Rechecking labs today. Await results. Treat as needed.  °

## 2021-11-03 NOTE — Assessment & Plan Note (Signed)
Under good control on current regimen. Continue current regimen. Continue to monitor. Call with any concerns. Refills given. Labs drawn today.   

## 2021-11-03 NOTE — Assessment & Plan Note (Signed)
Following with endocrinology. Call with any concerns. Continue to monitor.  

## 2021-11-03 NOTE — Progress Notes (Signed)
BP 130/78   Pulse 83   Temp 97.7 F (36.5 C)   Wt 180 lb (81.6 kg)   SpO2 97%   BMI 31.89 kg/m    Subjective:    Patient ID: Maria Davies, female    DOB: February 06, 1941, 81 y.o.   MRN: [id number]  HPI: Maria Davies is a 81 y.o. female  Chief Complaint  Patient presents with   Hypertension   Hyperthyroidism   HYPERTENSION / [place] Satisfied with current treatment? yes Duration of hypertension: chronic BP monitoring frequency: not checking BP medication side effects: no Past BP meds: amlodipine, benazepril, metoprolol Duration of hyperlipidemia: chronic Cholesterol medication side effects: no Cholesterol supplements: none Past cholesterol medications: none Medication compliance: excellent compliance Aspirin: yes Recent stressors: no Recurrent headaches: no Visual changes: no Palpitations: no Dyspnea: no Chest pain: no Lower extremity edema: no Dizzy/lightheaded: no  HYPERTHYROIDISM Thyroid control status:controlled Satisfied with current treatment? yes Medication side effects: no Medication compliance: excellent compliance Recent dose adjustment:yes Fatigue: no Cold intolerance: no Heat intolerance: no Weight gain: no Weight loss: no Constipation: no Diarrhea/loose stools: no Palpitations: no Lower extremity edema: no Anxiety/depressed mood: no   Relevant past medical, surgical, family and social history reviewed and updated as indicated. Interim medical history since our last visit reviewed. Allergies and medications reviewed and updated.  Review of Systems  Constitutional: Negative.   Respiratory: Negative.    Cardiovascular: Negative.   Gastrointestinal: Negative.   Musculoskeletal: Negative.   Neurological: Negative.   Psychiatric/Behavioral: Negative.     Per HPI unless specifically indicated above     Objective:    BP 130/78   Pulse 83   Temp 97.7 F (36.5 C)   Wt 180 lb (81.6 kg)   SpO2 97%   BMI 31.89 kg/m   Wt  Readings from Last 3 Encounters:  11/03/21 180 lb (81.6 kg)  09/05/21 182 lb (82.6 kg)  08/30/21 170 lb (77.1 kg)    Physical Exam Vitals and nursing note reviewed.  Constitutional:      General: She is not in acute distress.    Appearance: Normal appearance. She is not ill-appearing, toxic-appearing or diaphoretic.  HENT:     Head: Normocephalic and atraumatic.     Right Ear: External ear normal.     Left Ear: External ear normal.     Nose: Nose normal.     Mouth/Throat:     Mouth: Mucous membranes are moist.     Pharynx: Oropharynx is clear.  Eyes:     General: No scleral icterus.       Right eye: No discharge.        Left eye: No discharge.     Extraocular Movements: Extraocular movements intact.     Conjunctiva/sclera: Conjunctivae normal.     Pupils: Pupils are equal, round, and reactive to light.  Cardiovascular:     Rate and Rhythm: Normal rate and regular rhythm.     Pulses: Normal pulses.     Heart sounds: Normal heart sounds. No murmur heard.   No friction rub. No gallop.  Pulmonary:     Effort: Pulmonary effort is normal. No respiratory distress.     Breath sounds: Normal breath sounds. No stridor. No wheezing, rhonchi or rales.  Chest:     Chest wall: No tenderness.  Musculoskeletal:        General: Normal range of motion.     Cervical back: Normal range of motion and neck supple.  Skin:    General:  Skin is warm and dry.     Capillary Refill: Capillary refill takes less than 2 seconds.     Coloration: Skin is not jaundiced or pale.     Findings: No bruising, erythema, lesion or rash.  Neurological:     General: No focal deficit present.     Mental Status: She is alert and oriented to person, place, and time. Mental status is at baseline.  Psychiatric:        Mood and Affect: Mood normal.        Behavior: Behavior normal.        Thought Content: Thought content normal.        Judgment: Judgment normal.    Results for orders placed or performed in visit  on 08/16/21  CBC with Differential/Platelet  Result Value Ref Range   WBC 6.4 4.0 - 10.5 K/uL   RBC 4.20 3.87 - 5.11 MIL/uL   Hemoglobin 14.2 12.0 - 15.0 g/dL   HCT 40.8 36.0 - 46.0 %   MCV 97.1 80.0 - 100.0 fL   MCH 33.8 26.0 - 34.0 pg   MCHC 34.8 30.0 - 36.0 g/dL   RDW 12.6 11.5 - 15.5 %   Platelets 161 150 - 400 K/uL   nRBC 0.0 0.0 - 0.2 %   Neutrophils Relative % 56 %   Neutro Abs 3.7 1.7 - 7.7 K/uL   Lymphocytes Relative 30 %   Lymphs Abs 1.9 0.7 - 4.0 K/uL   Monocytes Relative 10 %   Monocytes Absolute 0.7 0.1 - 1.0 K/uL   Eosinophils Relative 2 %   Eosinophils Absolute 0.1 0.0 - 0.5 K/uL   Basophils Relative 1 %   Basophils Absolute 0.0 0.0 - 0.1 K/uL   Immature Granulocytes 1 %   Abs Immature Granulocytes 0.03 0.00 - 0.07 K/uL  CEA  Result Value Ref Range   CEA 1.6 0.0 - 4.7 ng/mL      Assessment & Plan:   Problem List Items Addressed This Visit       Cardiovascular and Mediastinum   Hypertension - Primary    Under good control on current regimen. Continue current regimen. Continue to monitor. Call with any concerns. Refills given. Labs drawn today.         Relevant Medications   metoprolol succinate (TOPROL-XL) 50 MG 24 hr tablet   benazepril (LOTENSIN) 20 MG tablet   amLODipine (NORVASC) 5 MG tablet   Other Relevant Orders   Comprehensive metabolic panel   Aortic atherosclerosis (HCC)    Will keep BP and cholesterol under good control. Continue to monitor. Call with any concerns.        Relevant Medications   metoprolol succinate (TOPROL-XL) 50 MG 24 hr tablet   benazepril (LOTENSIN) 20 MG tablet   amLODipine (NORVASC) 5 MG tablet     Digestive   Colon cancer [facility])    Continue to follow with oncology. Call with any concerns.          Endocrine   Hyperthyroidism    Following with endocrinology. Call with any concerns. Continue to monitor.        Relevant Medications   metoprolol succinate (TOPROL-XL) 50 MG 24 hr tablet   RESOLVED:  Acquired hypothyroidism   Relevant Medications   metoprolol succinate (TOPROL-XL) 50 MG 24 hr tablet     Other   Breast cancer in female [facility])    Continue to follow with oncology. Call with any concerns.  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 Call with any concerns.        Hypercholesteremia    Under good control on current regimen. Continue current regimen. Continue to monitor. Call with any concerns. Refills given. Labs drawn today.        Relevant Medications   metoprolol succinate (TOPROL-XL) 50 MG 24 hr tablet   benazepril (LOTENSIN) 20 MG tablet   amLODipine (NORVASC) 5 MG tablet   Other Relevant Orders   Comprehensive metabolic panel   Lipid Panel w/o Chol/HDL Ratio   Iron deficiency anemia secondary to blood loss (chronic)    Rechecking labs today. Await results. Treat as needed.        Relevant Orders   CBC with Differential/Platelet     Follow up plan: Return in about 6 months (around 05/06/2022).

## 2021-11-04 ENCOUNTER — Encounter: Payer: Self-pay | Admitting: Oncology

## 2021-11-04 LAB — CBC WITH DIFFERENTIAL/PLATELET
Basophils Absolute: 0 10*3/uL (ref 0.0–0.2)
Basos: 1 %
EOS (ABSOLUTE): 0.2 10*3/uL (ref 0.0–0.4)
Eos: 3 %
Hematocrit: 43.6 % (ref 34.0–46.6)
Hemoglobin: 15 g/dL (ref 11.1–15.9)
Immature Grans (Abs): 0 10*3/uL (ref 0.0–0.1)
Immature Granulocytes: 0 %
Lymphocytes Absolute: 1.7 10*3/uL (ref 0.7–3.1)
Lymphs: 29 %
MCH: 33.3 pg — ABNORMAL HIGH (ref 26.6–33.0)
MCHC: 34.4 g/dL (ref 31.5–35.7)
MCV: 97 fL (ref 79–97)
Monocytes Absolute: 0.7 10*3/uL (ref 0.1–0.9)
Monocytes: 12 %
Neutrophils Absolute: 3.2 10*3/uL (ref 1.4–7.0)
Neutrophils: 55 %
Platelets: 165 10*3/uL (ref 150–450)
RBC: 4.5 x10E6/uL (ref 3.77–5.28)
RDW: 12 % (ref 11.7–15.4)
WBC: 5.8 10*3/uL (ref 3.4–10.8)

## 2021-11-04 LAB — COMPREHENSIVE METABOLIC PANEL
ALT: 15 IU/L (ref 0–32)
AST: 19 IU/L (ref 0–40)
Albumin/Globulin Ratio: 2.1 (ref 1.2–2.2)
Albumin: 4.7 g/dL (ref 3.7–4.7)
Alkaline Phosphatase: 103 IU/L (ref 44–121)
BUN/Creatinine Ratio: 17 (ref 12–28)
BUN: 8 mg/dL (ref 8–27)
Bilirubin Total: 0.8 mg/dL (ref 0.0–1.2)
CO2: 24 mmol/L (ref 20–29)
Calcium: 10.1 mg/dL (ref 8.7–10.3)
Chloride: 101 mmol/L (ref 96–106)
Creatinine, Ser: 0.48 mg/dL — ABNORMAL LOW (ref 0.57–1.00)
Globulin, Total: 2.2 g/dL (ref 1.5–4.5)
Glucose: 114 mg/dL — ABNORMAL HIGH (ref 70–99)
Potassium: 3.9 mmol/L (ref 3.5–5.2)
Sodium: 142 mmol/L (ref 134–144)
Total Protein: 6.9 g/dL (ref 6.0–8.5)
eGFR: 96 mL/min/{1.73_m2} (ref >59)

## 2021-11-04 LAB — LIPID PANEL W/O CHOL/HDL RATIO
Cholesterol, Total: 219 mg/dL — ABNORMAL HIGH (ref 100–199)
HDL: 132 mg/dL (ref >39)
LDL Chol Calc (NIH): 77 mg/dL (ref 0–99)
Triglycerides: 57 mg/dL (ref 0–149)
VLDL Cholesterol Cal: 10 mg/dL (ref 5–40)

## 2021-11-07 ENCOUNTER — Encounter: Payer: Self-pay | Admitting: Family Medicine

## 2021-11-07 DIAGNOSIS — S42202D Unspecified fracture of upper end of left humerus, subsequent encounter for fracture with routine healing: Secondary | ICD-10-CM | POA: Diagnosis not present

## 2021-11-09 ENCOUNTER — Encounter: Payer: Self-pay | Admitting: Gastroenterology

## 2021-11-09 ENCOUNTER — Other Ambulatory Visit: Payer: Self-pay

## 2021-11-09 ENCOUNTER — Ambulatory Visit (INDEPENDENT_AMBULATORY_CARE_PROVIDER_SITE_OTHER): Payer: Medicare Other | Admitting: Gastroenterology

## 2021-11-09 VITALS — BP 162/99 | HR 83 | Temp 97.7°F | Ht 63.0 in | Wt 182.0 lb

## 2021-11-09 DIAGNOSIS — Z85038 Personal history of other malignant neoplasm of large intestine: Secondary | ICD-10-CM

## 2021-11-09 MED ORDER — NA SULFATE-K SULFATE-MG SULF 17.5-3.13-1.6 GM/177ML PO SOLN: 1.0000 | Freq: Once | ORAL | 0 refills | Status: AC

## 2021-11-09 NOTE — Progress Notes (Signed)
Primary Care Physician: Valerie Roys, DO  Primary Gastroenterologist:  Dr. Lucilla Lame  No chief complaint on file.   HPI: Maria Davies is a 81 y.o. female here who has a history of colon cancer with a colonoscopy by me in 2021.  At that time the patient had a hepatic flexure mass that was removed and the patient has had chemotherapy.  The patient has not had a repeat colonoscopy since then.  She denies any abdominal pain nausea vomiting fevers chills black stools or bloody stools.  The patient has had a episode of Lonia Blood syndrome and also fell and broke her left arm.  She is doing much better at the present time.  She is now here to follow-up because of her colon cancer and need for repeat colonoscopy.  The patient has a implantable pacemaker.  Past Medical History:  Diagnosis Date   Anemia    Arthritis    Basal cell carcinoma    back   Breast cancer ([place]) 2010   RT LUMPECTOMY   Cancer ([place])    rt breast   Chemotherapy induced nausea and vomiting    Colon cancer ([facility]) 2021   colon cancer   Heart murmur    Hypertension    Hypothyroidism    currently not on meds   Osteopenia    Personal history of chemotherapy 2010   right breast ca   Personal history of radiation therapy 2010   right breast ca   Port-A-Cath in place     Current Outpatient Medications  Medication Sig Dispense Refill   acetaminophen (TYLENOL) 325 MG tablet Take 325 mg by mouth 3 (three) times daily as needed for moderate pain or headache.     amLODipine (NORVASC) 5 MG tablet Take 1 tablet (5 mg total) by mouth daily. 90 tablet 1   aspirin EC 81 MG tablet Take 81 mg by mouth daily.     benazepril (LOTENSIN) 20 MG tablet Take 1 tablet (20 mg total) by mouth daily. 90 tablet 1   lidocaine-prilocaine (EMLA) cream Apply to affected area once 30 g 3   methimazole (TAPAZOLE) 5 MG tablet Take 5 mg by mouth daily.     metoprolol succinate (TOPROL-XL) 50 MG 24 hr tablet Take 1 tablet (50 mg total) by  mouth daily. Take with or immediately following a meal. 90 tablet 1   Multiple Vitamin (MULTI-VITAMINS) TABS Take 1 tablet by mouth daily.      Na Sulfate-K Sulfate-Mg Sulf 17.5-3.13-1.6 GM/177ML SOLN Take 1 kit by mouth once for 1 dose. 354 mL 0   Probiotic Product (PROBIOTIC ADVANCED PO) Take 1-2 capsules by mouth daily. When taking antibiotics pt will take 2 capsules instead of 1, normally just takes 1 per day otherwise     silver sulfADIAZINE (SILVADENE) 1 % cream Apply 1 application topically daily. 50 g 0   Sodium Hyaluronate, oral, (HYALURONIC ACID PO) Take 1 tablet by mouth daily.     vitamin B-12 (CYANOCOBALAMIN) 1000 MCG tablet Take 1,000 mcg by mouth daily.     ZINC CITRATE PO Take by mouth. (Patient not taking: Reported on 11/03/2021)     No current facility-administered medications for this visit.   Facility-Administered Medications Ordered in Other Visits  Medication Dose Route Frequency Provider Last Rate Last Admin   sodium chloride flush (NS) 0.9 % injection 10 mL  10 mL Intravenous PRN Lloyd Huger, MD   10 mL at 03/07/20 0836   sodium chloride flush (  NS) 0.9 % injection 10 mL  10 mL Intravenous PRN Lloyd Huger, MD   10 mL at 06/21/20 1259    Allergies as of 11/09/2021   (No Known Allergies)    ROS:  General: Negative for anorexia, weight loss, fever, chills, fatigue, weakness. ENT: Negative for hoarseness, difficulty swallowing , nasal congestion. CV: Negative for chest pain, angina, palpitations, dyspnea on exertion, peripheral edema.  Respiratory: Negative for dyspnea at rest, dyspnea on exertion, cough, sputum, wheezing.  GI: See history of present illness. GU:  Negative for dysuria, hematuria, urinary incontinence, urinary frequency, nocturnal urination.  Endo: Negative for unusual weight change.    Physical Examination:   BP (!) 162/99   Pulse 83   Temp 97.7 F (36.5 C) (Oral)   Ht _0  (1.6 m)   Wt 182 lb (82.6 kg)   BMI 32.24 kg/m    General: Well-nourished, well-developed in no acute distress.  Eyes: No icterus. Conjunctivae pink. Lungs: Clear to auscultation bilaterally. Non-labored. Heart: Regular rate and rhythm, no murmurs rubs or gallops.  Abdomen: Bowel sounds are normal, nontender, nondistended, no hepatosplenomegaly or masses, no abdominal bruits or hernia , no rebound or guarding.   Extremities: No lower extremity edema. No clubbing or deformities. Neuro: Alert and oriented x 3.  Grossly intact. Skin: Warm and dry, no jaundice.   Psych: Alert and cooperative, normal mood and affect.  Labs:    Imaging Studies: No results found.  Assessment and Plan:   Maria Davies is a 81 y.o. y/o female who comes in today with a history of colon cancer at the hepatic flexure and is in need of a repeat colonoscopy.  The patient will be set up for a repeat colonoscopy.  The patient has a implantable pacemaker.  The patient has been explained the plan and agrees with it.     Lucilla Lame, MD. Marval Regal    Note: This dictation was prepared with Dragon dictation along with smaller phrase technology. Any transcriptional errors that result from this process are unintentional.

## 2021-11-09 NOTE — H&P (View-Only) (Signed)
Primary Care Physician: Valerie Roys, DO  Primary Gastroenterologist:  Dr. Lucilla Lame  No chief complaint on file.   HPI: Maria Davies is a 81 y.o. female here who has a history of colon cancer with a colonoscopy by me in 2021.  At that time the patient had a hepatic flexure mass that was removed and the patient has had chemotherapy.  The patient has not had a repeat colonoscopy since then.  She denies any abdominal pain nausea vomiting fevers chills black stools or bloody stools.  The patient has had a episode of Lonia Blood syndrome and also fell and broke her left arm.  She is doing much better at the present time.  She is now here to follow-up because of her colon cancer and need for repeat colonoscopy.  The patient has a implantable pacemaker.  Past Medical History:  Diagnosis Date   Anemia    Arthritis    Basal cell carcinoma    back   Breast cancer ([place]) 2010   RT LUMPECTOMY   Cancer ([facility])    rt breast   Chemotherapy induced nausea and vomiting    Colon cancer ([facility]) 2021   colon cancer   Heart murmur    Hypertension    Hypothyroidism    currently not on meds   Osteopenia    Personal history of chemotherapy 2010   right breast ca   Personal history of radiation therapy 2010   right breast ca   Port-A-Cath in place     Current Outpatient Medications  Medication Sig Dispense Refill   acetaminophen (TYLENOL) 325 MG tablet Take 325 mg by mouth 3 (three) times daily as needed for moderate pain or headache.     amLODipine (NORVASC) 5 MG tablet Take 1 tablet (5 mg total) by mouth daily. 90 tablet 1   aspirin EC 81 MG tablet Take 81 mg by mouth daily.     benazepril (LOTENSIN) 20 MG tablet Take 1 tablet (20 mg total) by mouth daily. 90 tablet 1   lidocaine-prilocaine (EMLA) cream Apply to affected area once 30 g 3   methimazole (TAPAZOLE) 5 MG tablet Take 5 mg by mouth daily.     metoprolol succinate (TOPROL-XL) 50 MG 24 hr tablet Take 1 tablet (50 mg total) by  mouth daily. Take with or immediately following a meal. 90 tablet 1   Multiple Vitamin (MULTI-VITAMINS) TABS Take 1 tablet by mouth daily.      Na Sulfate-K Sulfate-Mg Sulf 17.5-3.13-1.6 GM/177ML SOLN Take 1 kit by mouth once for 1 dose. 354 mL 0   Probiotic Product (PROBIOTIC ADVANCED PO) Take 1-2 capsules by mouth daily. When taking antibiotics pt will take 2 capsules instead of 1, normally just takes 1 per day otherwise     silver sulfADIAZINE (SILVADENE) 1 % cream Apply 1 application topically daily. 50 g 0   Sodium Hyaluronate, oral, (HYALURONIC ACID PO) Take 1 tablet by mouth daily.     vitamin B-12 (CYANOCOBALAMIN) 1000 MCG tablet Take 1,000 mcg by mouth daily.     ZINC CITRATE PO Take by mouth. (Patient not taking: Reported on 11/03/2021)     No current facility-administered medications for this visit.   Facility-Administered Medications Ordered in Other Visits  Medication Dose Route Frequency Provider Last Rate Last Admin   sodium chloride flush (NS) 0.9 % injection 10 mL  10 mL Intravenous PRN Lloyd Huger, MD   10 mL at 03/07/20 0836   sodium chloride flush (  NS) 0.9 % injection 10 mL  10 mL Intravenous PRN Lloyd Huger, MD   10 mL at 06/21/20 1259    Allergies as of 11/09/2021   (No Known Allergies)    ROS:  General: Negative for anorexia, weight loss, fever, chills, fatigue, weakness. ENT: Negative for hoarseness, difficulty swallowing , nasal congestion. CV: Negative for chest pain, angina, palpitations, dyspnea on exertion, peripheral edema.  Respiratory: Negative for dyspnea at rest, dyspnea on exertion, cough, sputum, wheezing.  GI: See history of present illness. GU:  Negative for dysuria, hematuria, urinary incontinence, urinary frequency, nocturnal urination.  Endo: Negative for unusual weight change.    Physical Examination:   BP (!) 162/99   Pulse 83   Temp 97.7 F (36.5 C) (Oral)   Ht _0  (1.6 m)   Wt 182 lb (82.6 kg)   BMI 32.24 kg/m    General: Well-nourished, well-developed in no acute distress.  Eyes: No icterus. Conjunctivae pink. Lungs: Clear to auscultation bilaterally. Non-labored. Heart: Regular rate and rhythm, no murmurs rubs or gallops.  Abdomen: Bowel sounds are normal, nontender, nondistended, no hepatosplenomegaly or masses, no abdominal bruits or hernia , no rebound or guarding.   Extremities: No lower extremity edema. No clubbing or deformities. Neuro: Alert and oriented x 3.  Grossly intact. Skin: Warm and dry, no jaundice.   Psych: Alert and cooperative, normal mood and affect.  Labs:    Imaging Studies: No results found.  Assessment and Plan:   Maria Davies is a 81 y.o. y/o female who comes in today with a history of colon cancer at the hepatic flexure and is in need of a repeat colonoscopy.  The patient will be set up for a repeat colonoscopy.  The patient has a implantable pacemaker.  The patient has been explained the plan and agrees with it.     Lucilla Lame, MD. Marval Regal    Note: This dictation was prepared with Dragon dictation along with smaller phrase technology. Any transcriptional errors that result from this process are unintentional.

## 2021-11-14 ENCOUNTER — Telehealth: Payer: Self-pay

## 2021-11-14 DIAGNOSIS — S42202D Unspecified fracture of upper end of left humerus, subsequent encounter for fracture with routine healing: Secondary | ICD-10-CM | POA: Diagnosis not present

## 2021-11-14 NOTE — Telephone Encounter (Signed)
Bowel prep has been changed from Suprep to [place].  LVM advising patient of this and requested a call back.  Thanks, [place], [place]

## 2021-11-15 ENCOUNTER — Telehealth: Payer: Self-pay

## 2021-11-15 NOTE — Telephone Encounter (Signed)
Contacted patient this morning to make sure she understood her instructions I left on her voice mail about Nettie Elm.  She was instructed to mix bowel prep with clear liquid between 5pm-6pm drink half 8oz every 15-20 mins then drink the 2nd half 5 hours prior to colonoscopy.  Thanks, [place], [place]

## 2021-11-16 ENCOUNTER — Ambulatory Visit: Payer: Medicare Other | Admitting: Registered Nurse

## 2021-11-16 ENCOUNTER — Ambulatory Visit
Admission: RE | Admit: 2021-11-16 | Discharge: 2021-11-16 | Disposition: A | Discharge status: Home / Self Care | Payer: Medicare Other | Attending: Gastroenterology | Admitting: Gastroenterology

## 2021-11-16 ENCOUNTER — Encounter: Payer: Self-pay | Admitting: Gastroenterology

## 2021-11-16 ENCOUNTER — Encounter
Admission: RE | Disposition: A | Discharge status: Home / Self Care | Payer: Self-pay | Source: Home / Self Care | Attending: Gastroenterology

## 2021-11-16 DIAGNOSIS — Z9049 Acquired absence of other specified parts of digestive tract: Secondary | ICD-10-CM | POA: Insufficient documentation

## 2021-11-16 DIAGNOSIS — D124 Benign neoplasm of descending colon: Secondary | ICD-10-CM | POA: Diagnosis not present

## 2021-11-16 DIAGNOSIS — Z85038 Personal history of other malignant neoplasm of large intestine: Secondary | ICD-10-CM | POA: Diagnosis not present

## 2021-11-16 DIAGNOSIS — Z08 Encounter for follow-up examination after completed treatment for malignant neoplasm: Secondary | ICD-10-CM | POA: Diagnosis not present

## 2021-11-16 DIAGNOSIS — K635 Polyp of colon: Secondary | ICD-10-CM | POA: Diagnosis not present

## 2021-11-16 DIAGNOSIS — E039 Hypothyroidism, unspecified: Secondary | ICD-10-CM | POA: Diagnosis not present

## 2021-11-16 DIAGNOSIS — Z923 Personal history of irradiation: Secondary | ICD-10-CM | POA: Insufficient documentation

## 2021-11-16 DIAGNOSIS — I10 Essential (primary) hypertension: Secondary | ICD-10-CM | POA: Diagnosis not present

## 2021-11-16 DIAGNOSIS — K621 Rectal polyp: Secondary | ICD-10-CM | POA: Insufficient documentation

## 2021-11-16 DIAGNOSIS — Z9221 Personal history of antineoplastic chemotherapy: Secondary | ICD-10-CM | POA: Insufficient documentation

## 2021-11-16 DIAGNOSIS — K5730 Diverticulosis of large intestine without perforation or abscess without bleeding: Secondary | ICD-10-CM | POA: Diagnosis not present

## 2021-11-16 DIAGNOSIS — D128 Benign neoplasm of rectum: Secondary | ICD-10-CM | POA: Diagnosis not present

## 2021-11-16 DIAGNOSIS — Z853 Personal history of malignant neoplasm of breast: Secondary | ICD-10-CM | POA: Diagnosis not present

## 2021-11-16 DIAGNOSIS — Z1211 Encounter for screening for malignant neoplasm of colon: Secondary | ICD-10-CM | POA: Diagnosis not present

## 2021-11-16 DIAGNOSIS — Z950 Presence of cardiac pacemaker: Secondary | ICD-10-CM | POA: Diagnosis not present

## 2021-11-16 DIAGNOSIS — D126 Benign neoplasm of colon, unspecified: Secondary | ICD-10-CM | POA: Diagnosis not present

## 2021-11-16 DIAGNOSIS — Z85828 Personal history of other malignant neoplasm of skin: Secondary | ICD-10-CM | POA: Diagnosis not present

## 2021-11-16 HISTORY — DX: Unspecified fracture of shaft of humerus, unspecified arm, initial encounter for closed fracture: S42.309A

## 2021-11-16 HISTORY — PX: COLONOSCOPY WITH PROPOFOL: SHX5780

## 2021-11-16 SURGERY — COLONOSCOPY WITH PROPOFOL
Anesthesia: General

## 2021-11-16 MED ORDER — PROPOFOL 500 MG/50ML IV EMUL
INTRAVENOUS | Status: DC | PRN
  Administered 2021-11-16: 140 ug/kg/min via INTRAVENOUS

## 2021-11-16 MED ORDER — PROPOFOL 10 MG/ML IV BOLUS
INTRAVENOUS | Status: DC | PRN
  Administered 2021-11-16: 30 mg via INTRAVENOUS
  Administered 2021-11-16: 40 mg via INTRAVENOUS
  Administered 2021-11-16: 70 mg via INTRAVENOUS
  Administered 2021-11-16: 30 mg via INTRAVENOUS

## 2021-11-16 MED ORDER — SODIUM CHLORIDE 0.9 % IV SOLN: INTRAVENOUS | Status: DC

## 2021-11-16 MED ORDER — PHENYLEPHRINE HCL (PRESSORS) 10 MG/ML IV SOLN
INTRAVENOUS | Status: DC | PRN
  Administered 2021-11-16 (×2): 80 ug via INTRAVENOUS

## 2021-11-16 NOTE — Anesthesia Postprocedure Evaluation (Signed)
Anesthesia Post Note  Patient: Maria Davies  Procedure(s) Performed: COLONOSCOPY WITH PROPOFOL  Patient location during evaluation: Endoscopy Anesthesia Type: General Level of consciousness: awake and alert Pain management: pain level controlled Vital Signs Assessment: post-procedure vital signs reviewed and stable Respiratory status: spontaneous breathing, nonlabored ventilation, respiratory function stable and patient connected to nasal cannula oxygen Cardiovascular status: blood pressure returned to baseline and stable Postop Assessment: no apparent nausea or vomiting Anesthetic complications: no   No notable events documented.   Last Vitals:  Vitals:   11/16/21 0941 11/16/21 0944  BP:  (!) 167/84  Pulse:    Resp: 18 18  Temp:    SpO2:      Last Pain:  Vitals:   11/16/21 0952  TempSrc:   PainSc: 0-No pain                 Precious Haws Brinson Tozzi

## 2021-11-16 NOTE — Transfer of Care (Signed)
Immediate Anesthesia Transfer of Care Note  Patient: Maria Davies  Procedure(s) Performed: COLONOSCOPY WITH PROPOFOL  Patient Location: PACU  Anesthesia Type:General  Level of Consciousness: awake, alert  and oriented  Airway & Oxygen Therapy: Patient Spontanous Breathing  Post-op Assessment: Report given to RN and Post -op Vital signs reviewed and stable  Post vital signs: Reviewed and stable  Last Vitals:  Vitals Value Taken Time  BP    Temp    Pulse 82 11/16/21 0915  Resp 25 11/16/21 0915  SpO2 99 % 11/16/21 0915  Vitals shown include unvalidated device data.  Last Pain:  Vitals:   11/16/21 0831  TempSrc: Temporal  PainSc: 0-No pain         Complications: No notable events documented.

## 2021-11-16 NOTE — Interval H&P Note (Signed)
Lucilla Lame, MD [facility] [address], [place], [place] [address] Phone:[phone] Fax : [phone]  Primary Care Physician:  Valerie Roys, DO Primary Gastroenterologist:  Dr. Allen Norris  Pre-Procedure History & Physical: HPI:  Maria Davies is a 81 y.o. female is here for an colonoscopy.   Past Medical History:  Diagnosis Date   Anemia    Arthritis    Basal cell carcinoma    back   Breast cancer ([place]) 2010   RT LUMPECTOMY   Cancer ([place])    rt breast   Chemotherapy induced nausea and vomiting    Colon cancer ([place]) 2021   colon cancer   Heart murmur    Hypertension    Hypothyroidism    currently not on meds   Osteopenia    Personal history of chemotherapy 2010   right breast ca   Personal history of radiation therapy 2010   right breast ca   Port-A-Cath in place     Past Surgical History:  Procedure Laterality Date   aortic valve stenosis  08/11/2020   BACK SURGERY     BREAST EXCISIONAL BIOPSY Right 03/2009   radiation and chemo +   BREAST EXCISIONAL BIOPSY Bilateral    NEG   BREAST SURGERY     CATARACT EXTRACTION W/ INTRAOCULAR LENS  IMPLANT, BILATERAL     CHOLECYSTECTOMY     COLONOSCOPY WITH PROPOFOL N/A 10/19/2019   Procedure: COLONOSCOPY WITH PROPOFOL-attempted, unable to perform poor prep;  Surgeon: Lucilla Lame, MD;  Location: [facility];  Service: Endoscopy;  Laterality: N/A;  priority 3   COLONOSCOPY WITH PROPOFOL N/A 10/20/2019   Procedure: COLONOSCOPY WITH PROPOFOL;  Surgeon: Lucilla Lame, MD;  Location: [facility] ENDOSCOPY;  Service: Endoscopy;  Laterality: N/A;   ESOPHAGEAL DILATION  10/19/2019   Procedure: ESOPHAGEAL DILATION;  Surgeon: Lucilla Lame, MD;  Location: [facility];  Service: Endoscopy;;   ESOPHAGOGASTRODUODENOSCOPY (EGD) WITH PROPOFOL N/A 10/19/2019   Procedure: ESOPHAGOGASTRODUODENOSCOPY (EGD) WITH PROPOFOL;  Surgeon: Lucilla Lame, MD;  Location: [facility];  Service: Endoscopy;  Laterality: N/A;   PORTACATH  PLACEMENT Right 11/23/2019   Procedure: INSERTION PORT-A-CATH;  Surgeon: Ronny Bacon, MD;  Location: [facility];  Service: General;  Laterality: Right;    Prior to Admission medications   Medication Sig Start Date End Date Taking? Authorizing Provider  amLODipine (NORVASC) 5 MG tablet Take 1 tablet (5 mg total) by mouth daily. 11/03/21  Yes Johnson, Megan P, DO  benazepril (LOTENSIN) 20 MG tablet Take 1 tablet (20 mg total) by mouth daily. 11/03/21  Yes Johnson, Megan P, DO  methimazole (TAPAZOLE) 5 MG tablet Take 5 mg by mouth daily. 02/27/21  Yes [provider]  metoprolol succinate (TOPROL-XL) 50 MG 24 hr tablet Take 1 tablet (50 mg total) by mouth daily. Take with or immediately following a meal. 11/03/21  Yes Johnson, Megan P, DO  acetaminophen (TYLENOL) 325 MG tablet Take 325 mg by mouth 3 (three) times daily as needed for moderate pain or headache.    [provider]  aspirin EC 81 MG tablet Take 81 mg by mouth daily.    [provider]  lidocaine-prilocaine (EMLA) cream Apply to affected area once 11/19/19   Lloyd Huger, MD  Multiple Vitamin (MULTI-VITAMINS) TABS Take 1 tablet by mouth daily.     [provider]  Probiotic Product (PROBIOTIC ADVANCED PO) Take 1-2 capsules by mouth daily. When taking antibiotics pt will take 2 capsules instead of 1, normally just  takes 1 per day otherwise    [provider]  silver sulfADIAZINE (SILVADENE) 1 % cream Apply 1 application topically daily. 07/22/17   Guadalupe Maple, MD  Sodium Hyaluronate, oral, (HYALURONIC ACID PO) Take 1 tablet by mouth daily.    [provider]  vitamin B-12 (CYANOCOBALAMIN) 1000 MCG tablet Take 1,000 mcg by mouth daily.    [provider]  ZINC CITRATE PO Take by mouth. Patient not taking: Reported on 11/03/2021    [provider]    Allergies as of 11/09/2021   (No Known Allergies)    Family History  Problem Relation Age of Onset   Cancer  Mother    Heart disease Father    Breast cancer Paternal Aunt 51    Social History   Socioeconomic History   Marital status: Widowed    Spouse name: Not on file   Number of children: Not on file   Years of education: Not on file   Highest education level: Not on file  Occupational History   Occupation: retired   Tobacco Use   Smoking status: Never   Smokeless tobacco: Never  Vaping Use   Vaping Use: Never used  Substance and Sexual Activity   Alcohol use: Yes    Alcohol/week: 2.0 standard drinks    Types: 2 Standard drinks or equivalent per week    Comment: only on weeks not having treatment   Drug use: No   Sexual activity: Not Currently  Other Topics Concern   Not on file  Social History Narrative   Not on file   Social Determinants of Health   Financial Resource Strain: Low Risk    Difficulty of Paying Living Expenses: Not hard at all  Food Insecurity: No Food Insecurity   Worried About Charity fundraiser in the Last Year: Never true   Ran Out of Food in the Last Year: Never true  Transportation Needs: No Transportation Needs   Lack of Transportation (Medical): No   Lack of Transportation (Non-Medical): No  Physical Activity: Insufficiently Active   Days of Exercise per Week: 4 days   Minutes of Exercise per Session: 30 min  Stress: No Stress Concern Present   Feeling of Stress : Not at all  Social Connections: Moderately Isolated   Frequency of Communication with Friends and Family: More than three times a week   Frequency of Social Gatherings with Friends and Family: More than three times a week   Attends Religious Services: More than 4 times per year   Active Member of [employer] or Organizations: No   Attends Archivist Meetings: Never   Marital Status: Widowed  Human resources officer Violence: Not At Risk   Fear of Current or Ex-Partner: No   Emotionally Abused: No   Physically Abused: No   Sexually Abused: No    Review of Systems: See HPI,  otherwise negative ROS  Physical Exam: BP (!) 178/108   Pulse 98   Temp 98.7 F (37.1 C) (Temporal)   Resp 18   Ht '5\' 3"'$  (1.6 m)   Wt 79.4 kg   SpO2 100%   BMI 31.00 kg/m  General:   Alert,  pleasant and cooperative in NAD Head:  Normocephalic and atraumatic. Neck:  Supple; no masses or thyromegaly. Lungs:  Clear throughout to auscultation.    Heart:  Regular rate and rhythm. Abdomen:  Soft, nontender and nondistended. Normal bowel sounds, without guarding, and without rebound.   Neurologic:  Alert and  oriented x4;  grossly normal neurologically.  Impression/Plan: Maria Davies is here for an colonoscopy to be performed for personal history of colon cancer  Risks, benefits, limitations, and alternatives regarding  colonoscopy have been reviewed with the patient.  Questions have been answered.  All parties agreeable.   Lucilla Lame, MD  11/16/2021, 8:39 AM

## 2021-11-16 NOTE — Anesthesia Preprocedure Evaluation (Signed)
Anesthesia Evaluation  Patient identified by MRN, date of birth, ID band Patient awake    Reviewed: Allergy & Precautions, NPO status , Patient's Chart, lab work & pertinent test results  History of Anesthesia Complications Negative for: history of anesthetic complications  Airway Mallampati: III  TM Distance: <3 FB Neck ROM: full    Dental  (+) Chipped   Pulmonary neg pulmonary ROS, neg shortness of breath,    Pulmonary exam normal        Cardiovascular hypertension, Normal cardiovascular exam+ dysrhythmias + pacemaker + Valvular Problems/Murmurs      Neuro/Psych  Neuromuscular disease negative psych ROS   GI/Hepatic negative GI ROS, Neg liver ROS, neg GERD  ,  Endo/Other    Renal/GU negative Renal ROS  negative genitourinary   Musculoskeletal   Abdominal   Peds  Hematology negative hematology ROS (+)   Anesthesia Other Findings Past Medical History: No date: Anemia No date: Arthritis No date: Basal cell carcinoma     Comment:  back 2010: Breast cancer ([place])     Comment:  RT LUMPECTOMY No date: Cancer ([place])     Comment:  rt breast No date: Chemotherapy induced nausea and vomiting 2021: Colon cancer ([place])     Comment:  colon cancer No date: Heart murmur No date: Hypertension No date: Hypothyroidism     Comment:  currently not on meds No date: Osteopenia 2010: Personal history of chemotherapy     Comment:  right breast ca 2010: Personal history of radiation therapy     Comment:  right breast ca No date: Port-A-Cath in place  Past Surgical History: 08/11/2020: aortic valve stenosis No date: BACK SURGERY 03/2009: BREAST EXCISIONAL BIOPSY; Right     Comment:  radiation and chemo + No date: BREAST EXCISIONAL BIOPSY; Bilateral     Comment:  NEG No date: BREAST SURGERY No date: CATARACT EXTRACTION W/ INTRAOCULAR LENS  IMPLANT, BILATERAL No date: CHOLECYSTECTOMY 10/19/2019: COLONOSCOPY WITH PROPOFOL;  N/A     Comment:  Procedure: COLONOSCOPY WITH PROPOFOL-attempted, unable               to perform poor prep;  Surgeon: Lucilla Lame, MD;                Location: [facility];  Service: Endoscopy;                Laterality: N/A;  priority 3 10/20/2019: COLONOSCOPY WITH PROPOFOL; N/A     Comment:  Procedure: COLONOSCOPY WITH PROPOFOL;  Surgeon: Lucilla Lame, MD;  Location: [facility] ENDOSCOPY;  Service:               Endoscopy;  Laterality: N/A; 10/19/2019: ESOPHAGEAL DILATION     Comment:  Procedure: ESOPHAGEAL DILATION;  Surgeon: Lucilla Lame,               MD;  Location: [facility];  Service: Endoscopy;; 10/19/2019: ESOPHAGOGASTRODUODENOSCOPY (EGD) WITH PROPOFOL; N/A     Comment:  Procedure: ESOPHAGOGASTRODUODENOSCOPY (EGD) WITH               PROPOFOL;  Surgeon: Lucilla Lame, MD;  Location: [facility];  Service: Endoscopy;  Laterality: N/A; 11/23/2019: PORTACATH PLACEMENT; Right     Comment:  Procedure: INSERTION PORT-A-CATH;  Surgeon: Christian Mate,  Sharlet Salina, MD;  Location: [facility];  Service: General;                Laterality: Right;  BMI    Body Mass Index: 31.00 kg/m      Reproductive/Obstetrics negative OB ROS                             Anesthesia Physical Anesthesia Plan  ASA: 4  Anesthesia Plan: General   Post-op Pain Management:    Induction: Intravenous  PONV Risk Score and Plan: Propofol infusion and TIVA  Airway Management Planned: Natural Airway and Nasal Cannula  Additional Equipment:   Intra-op Plan:   Post-operative Plan:   Informed Consent: I have reviewed the patients History and Physical, chart, labs and discussed the procedure including the risks, benefits and alternatives for the proposed anesthesia with the patient or authorized representative who has indicated his/her understanding and acceptance.     Dental Advisory Given  Plan Discussed with: Anesthesiologist,  CRNA and Surgeon  Anesthesia Plan Comments: (Patient consented for risks of anesthesia including but not limited to:  - adverse reactions to medications - risk of airway placement if required - damage to eyes, teeth, lips or other oral mucosa - nerve damage due to positioning  - sore throat or hoarseness - Damage to heart, brain, nerves, lungs, other parts of body or loss of life  Patient voiced understanding.)        Anesthesia Quick Evaluation

## 2021-11-16 NOTE — Op Note (Addendum)
[facility]enter[facility] Patient Name: Maria Davies Procedure Date: 11/16/2021 8:50 AM MRN: [id number] Account #: [id number] Date of Birth: [birth year] Admit Type: Outpatient Age: 81 Room: [facility] ENDO ROOM 4 Gender: Female Note Status: Finalized Instrument Name: Park Meo [id number] Procedure:             Colonoscopy Indications:           Personal history of malignant neoplasm of the colon Providers:             Lucilla Lame MD, MD Referring MD:          Valerie Roys (Referring MD) Medicines:             Propofol per Anesthesia Complications:         No immediate complications. Procedure:             Pre-Anesthesia Assessment:                        - Prior to the procedure, a History and Physical was                         performed, and patient medications and allergies were                         reviewed. The patient's tolerance of previous                         anesthesia was also reviewed. The risks and benefits                         of the procedure and the sedation options and risks                         were discussed with the patient. All questions were                         answered, and informed consent was obtained. Prior                         Anticoagulants: The patient has taken no previous                         anticoagulant or antiplatelet agents. ASA Grade                         Assessment: II - A patient with mild systemic disease.                         After reviewing the risks and benefits, the patient                         was deemed in satisfactory condition to undergo the                         procedure.                        After obtaining informed consent, the colonoscope was  passed under direct vision. Throughout the procedure,                         the patient's blood pressure, pulse, and oxygen                         saturations were monitored continuously. The                          Colonoscope was introduced through the anus and                         advanced to the the ileocolonic anastomosis. The                         colonoscopy was performed without difficulty. The                         patient tolerated the procedure well. The quality of                         the bowel preparation was excellent. Findings:      The perianal and digital rectal examinations were normal.      A 3 mm polyp was found in the rectum. The polyp was sessile. The polyp       was removed with a cold snare. Resection and retrieval were complete.      A 4 mm polyp was found in the descending colon. The polyp was sessile.       The polyp was removed with a cold snare. Resection and retrieval were       complete.      A 10 mm polyp was found in the rectum. The polyp was sessile. Biopsies       were taken with a cold forceps for histology.      Multiple small-mouthed diverticula were found in the entire colon. Impression:            - One 3 mm polyp in the rectum, removed with a cold                         snare. Resected and retrieved.                        - One 4 mm polyp in the descending colon, removed with                         a cold snare. Resected and retrieved.                        - One 10 mm polyp in the rectum. Biopsied.                        - Diverticulosis in the entire examined colon. Recommendation:        - Discharge patient to home.                        - Resume previous diet.                        -  Continue present medications.                        - Await pathology results.                        - If rectal polyp adenomatous then trans-anal                         resection will be needed. Procedure Code(s):     --- Professional ---                        [phone], Colonoscopy, flexible; with removal of                         tumor(s), polyp(s), or other lesion(s) by snare                         technique                        45380, 56,  Colonoscopy, flexible; with biopsy, single                         or multiple Diagnosis Code(s):     --- Professional ---                        T01.779, Personal history of other malignant neoplasm                         of large intestine                        K63.5, Polyp of colon                        K62.1, Rectal polyp CPT copyright 2019 American Medical Association. All rights reserved. The codes documented in this report are preliminary and upon coder review may  be revised to meet current compliance requirements. Lucilla Lame MD, MD 11/16/2021 9:12:22 AM This report has been signed electronically. Number of Addenda: 0 Note Initiated On: 11/16/2021 8:50 AM Scope Withdrawal Time: 0 hours 7 minutes 33 seconds  Total Procedure Duration: 0 hours 9 minutes 46 seconds  Estimated Blood Loss:  Estimated blood loss: none.      [facility]

## 2021-11-17 ENCOUNTER — Encounter: Payer: Self-pay | Admitting: Gastroenterology

## 2021-11-20 LAB — SURGICAL PATHOLOGY

## 2021-11-21 DIAGNOSIS — I442 Atrioventricular block, complete: Secondary | ICD-10-CM | POA: Diagnosis not present

## 2021-11-21 DIAGNOSIS — Z952 Presence of prosthetic heart valve: Secondary | ICD-10-CM | POA: Diagnosis not present

## 2021-11-21 DIAGNOSIS — I10 Essential (primary) hypertension: Secondary | ICD-10-CM | POA: Diagnosis not present

## 2021-11-23 ENCOUNTER — Other Ambulatory Visit: Payer: Self-pay

## 2021-11-23 DIAGNOSIS — Z86010 Personal history of colonic polyps: Secondary | ICD-10-CM

## 2021-11-24 DIAGNOSIS — S42202D Unspecified fracture of upper end of left humerus, subsequent encounter for fracture with routine healing: Secondary | ICD-10-CM | POA: Diagnosis not present

## 2021-12-05 DIAGNOSIS — I442 Atrioventricular block, complete: Secondary | ICD-10-CM | POA: Diagnosis not present

## 2021-12-11 DIAGNOSIS — I350 Nonrheumatic aortic (valve) stenosis: Secondary | ICD-10-CM | POA: Diagnosis not present

## 2021-12-20 ENCOUNTER — Ambulatory Visit: Payer: Medicare Other | Admitting: Gastroenterology

## 2022-01-08 DIAGNOSIS — E0590 Thyrotoxicosis, unspecified without thyrotoxic crisis or storm: Secondary | ICD-10-CM | POA: Diagnosis not present

## 2022-01-15 DIAGNOSIS — E0590 Thyrotoxicosis, unspecified without thyrotoxic crisis or storm: Secondary | ICD-10-CM | POA: Diagnosis not present

## 2022-01-25 ENCOUNTER — Encounter: Payer: Self-pay | Admitting: *Deleted

## 2022-01-25 ENCOUNTER — Other Ambulatory Visit: Payer: Self-pay

## 2022-01-25 ENCOUNTER — Emergency Department
Admission: EM | Admit: 2022-01-25 | Discharge: 2022-01-25 | Discharge status: Against Medical Advice | Payer: Medicare Other | Attending: Emergency Medicine | Admitting: Emergency Medicine

## 2022-01-25 ENCOUNTER — Emergency Department: Payer: Medicare Other

## 2022-01-25 DIAGNOSIS — S0592XA Unspecified injury of left eye and orbit, initial encounter: Secondary | ICD-10-CM | POA: Diagnosis present

## 2022-01-25 DIAGNOSIS — S00212A Abrasion of left eyelid and periocular area, initial encounter: Secondary | ICD-10-CM | POA: Diagnosis not present

## 2022-01-25 DIAGNOSIS — W01198A Fall on same level from slipping, tripping and stumbling with subsequent striking against other object, initial encounter: Secondary | ICD-10-CM | POA: Insufficient documentation

## 2022-01-25 DIAGNOSIS — S0990XA Unspecified injury of head, initial encounter: Secondary | ICD-10-CM | POA: Insufficient documentation

## 2022-01-25 DIAGNOSIS — S0512XA Contusion of eyeball and orbital tissues, left eye, initial encounter: Secondary | ICD-10-CM | POA: Diagnosis not present

## 2022-01-25 DIAGNOSIS — E041 Nontoxic single thyroid nodule: Secondary | ICD-10-CM

## 2022-01-25 DIAGNOSIS — Z043 Encounter for examination and observation following other accident: Secondary | ICD-10-CM | POA: Diagnosis not present

## 2022-01-25 DIAGNOSIS — Z5321 Procedure and treatment not carried out due to patient leaving prior to being seen by health care provider: Secondary | ICD-10-CM | POA: Insufficient documentation

## 2022-01-25 DIAGNOSIS — M4802 Spinal stenosis, cervical region: Secondary | ICD-10-CM | POA: Diagnosis not present

## 2022-01-25 HISTORY — DX: Nontoxic single thyroid nodule: E04.1

## 2022-01-25 NOTE — ED Triage Notes (Signed)
Pt slipped and fell on a slick floor.  Pt fell face forward.  Pt has swelling to left eyebrow with an abrasion.  No loc.  No blood thinners.  Pt alert  speech clear.

## 2022-01-26 ENCOUNTER — Telehealth: Payer: Self-pay

## 2022-01-26 NOTE — Telephone Encounter (Signed)
Pt given results of CT head and cervical spine on 01/26/22. Pt verbalized understanding and had no questions. She was going to report thyroid US to endocrinology dr. Gabriel Carina.

## 2022-02-05 ENCOUNTER — Ambulatory Visit
Admission: RE | Admit: 2022-02-05 | Discharge: 2022-02-05 | Disposition: A | Discharge status: Home / Self Care | Payer: Medicare Other | Source: Ambulatory Visit | Attending: Oncology | Admitting: Oncology

## 2022-02-05 DIAGNOSIS — Z1231 Encounter for screening mammogram for malignant neoplasm of breast: Secondary | ICD-10-CM

## 2022-02-05 DIAGNOSIS — C182 Malignant neoplasm of ascending colon: Secondary | ICD-10-CM

## 2022-02-12 ENCOUNTER — Ambulatory Visit
Admission: RE | Admit: 2022-02-12 | Discharge: 2022-02-12 | Disposition: A | Discharge status: Home / Self Care | Payer: Medicare Other | Source: Ambulatory Visit | Attending: Oncology | Admitting: Oncology

## 2022-02-12 DIAGNOSIS — K5730 Diverticulosis of large intestine without perforation or abscess without bleeding: Secondary | ICD-10-CM | POA: Diagnosis not present

## 2022-02-12 DIAGNOSIS — K769 Liver disease, unspecified: Secondary | ICD-10-CM | POA: Diagnosis not present

## 2022-02-12 DIAGNOSIS — C182 Malignant neoplasm of ascending colon: Secondary | ICD-10-CM | POA: Insufficient documentation

## 2022-02-12 LAB — POCT I-STAT CREATININE: Creatinine, Ser: 0.5 mg/dL (ref 0.44–1.00)

## 2022-02-12 MED ORDER — IOHEXOL 300 MG/ML  SOLN
100.0000 mL | Freq: Once | INTRAMUSCULAR | Status: AC | PRN
  Administered 2022-02-12: 100 mL via INTRAVENOUS

## 2022-02-13 DIAGNOSIS — Z952 Presence of prosthetic heart valve: Secondary | ICD-10-CM | POA: Diagnosis not present

## 2022-02-13 DIAGNOSIS — I350 Nonrheumatic aortic (valve) stenosis: Secondary | ICD-10-CM | POA: Diagnosis not present

## 2022-02-13 DIAGNOSIS — I700 Atherosclerosis of aorta: Secondary | ICD-10-CM | POA: Diagnosis not present

## 2022-02-13 DIAGNOSIS — I10 Essential (primary) hypertension: Secondary | ICD-10-CM | POA: Diagnosis not present

## 2022-02-13 DIAGNOSIS — Z950 Presence of cardiac pacemaker: Secondary | ICD-10-CM | POA: Diagnosis not present

## 2022-02-13 DIAGNOSIS — I442 Atrioventricular block, complete: Secondary | ICD-10-CM | POA: Diagnosis not present

## 2022-02-14 DIAGNOSIS — Z853 Personal history of malignant neoplasm of breast: Secondary | ICD-10-CM | POA: Diagnosis not present

## 2022-02-15 ENCOUNTER — Other Ambulatory Visit: Payer: Medicare Other

## 2022-02-15 ENCOUNTER — Ambulatory Visit: Payer: Medicare Other | Admitting: Oncology

## 2022-02-19 NOTE — Progress Notes (Signed)
[place]  [phone] [phone]) [phone]  ID: Elio Forget OB: Jan 16, 1941  MR#: [id number]  ZRA#:[id number]  Patient Care Team: Valerie Roys, DO as PCP - General (Family Medicine) Ronny Bacon, MD as Consulting Physician (General Surgery) Lloyd Huger, MD as Consulting Physician (Oncology)   CHIEF COMPLAINT: Stage IIIb right colon cancer.  INTERVAL HISTORY: Patient returns to clinic today for routine 72-monthevaluation and discussion of her imaging results.  She currently feels well and is asymptomatic.  She has no neurologic complaints.  She denies any recent fevers or illnesses.  She has no chest pain, shortness of breath, cough, or hemoptysis. She denies any vomiting, constipation, or diarrhea.  She has no melena or hematochezia.  She has no urinary complaints.  Patient offers no specific complaints today.  REVIEW OF SYSTEMS:   Review of Systems  Constitutional: Negative.  Negative for fever, malaise/fatigue and weight loss.  Respiratory: Negative.  Negative for cough, hemoptysis and shortness of breath.   Cardiovascular: Negative.  Negative for chest pain and leg swelling.  Gastrointestinal: Negative.  Negative for abdominal pain, blood in stool, melena and nausea.  Genitourinary: Negative.  Negative for hematuria.  Musculoskeletal: Negative.  Negative for back pain.  Skin: Negative.  Negative for rash.  Neurological: Negative.  Negative for dizziness, focal weakness, weakness and headaches.  Psychiatric/Behavioral: Negative.  The patient is not nervous/anxious.     As per HPI. Otherwise, a complete review of systems is negative.   PAST MEDICAL HISTORY: Past Medical History:  Diagnosis Date   Anemia    Arthritis    Basal cell carcinoma    back   Breast [place] [place]   RT LUMPECTOMY   Broken humerus    left arm, march 2023   [facility]    rt breast   Chemotherapy induced nausea and vomiting    Colon cancer  [place] 2021   colon cancer   Heart murmur    Hypertension    Hypothyroidism    currently not on meds   Osteopenia    Personal history of chemotherapy 2010   right breast ca   Personal history of radiation therapy 2010   right breast ca   Port-A-Cath in place     PAST SURGICAL HISTORY: Past Surgical History:  Procedure Laterality Date   aortic valve stenosis  08/11/2020   BACK SURGERY     BREAST EXCISIONAL BIOPSY Right 03/2009   radiation and chemo +   BREAST EXCISIONAL BIOPSY Bilateral    NEG   BREAST SURGERY     CATARACT EXTRACTION W/ INTRAOCULAR LENS  IMPLANT, BILATERAL     CHOLECYSTECTOMY     COLONOSCOPY WITH PROPOFOL N/A 10/19/2019   Procedure: COLONOSCOPY WITH PROPOFOL-attempted, unable to perform poor prep;  Surgeon: WLucilla Lame MD;  [place]  Service: Endoscopy;  Laterality: N/A;  priority 3   COLONOSCOPY WITH PROPOFOL N/A 10/20/2019   Procedure: COLONOSCOPY WITH PROPOFOL;  Surgeon: WLucilla Lame MD;  [facility]  Service: Endoscopy;  Laterality: N/A;   COLONOSCOPY WITH PROPOFOL N/A 11/16/2021   Procedure: COLONOSCOPY WITH PROPOFOL;  Surgeon: WLucilla Lame MD;  [facility];  Service: Endoscopy;  Laterality: N/A;   ESOPHAGEAL DILATION  10/19/2019   Procedure: ESOPHAGEAL DILATION;  Surgeon: WLucilla Lame MD;  [place]  Service: Endoscopy;;   ESOPHAGOGASTRODUODENOSCOPY (EGD) WITH PROPOFOL N/A 10/19/2019   Procedure: ESOPHAGOGASTRODUODENOSCOPY (EGD) WITH PROPOFOL;  Surgeon: WLucilla Lame MD;  [place] [facility]  Service: Endoscopy;  Laterality: N/A;   PORTACATH PLACEMENT Right 11/23/2019   Procedure: INSERTION PORT-A-CATH;  Surgeon: Campbell Lerner, MD;  Location: [facility] ORS;  Service: General;  Laterality: Right;    FAMILY HISTORY: Family History  Problem Relation Age of Onset   Cancer Mother    Heart disease Father    Breast cancer Paternal Aunt 43    ADVANCED DIRECTIVES (Y/N):  N  HEALTH  MAINTENANCE: Social History   Tobacco Use   Smoking status: Never   Smokeless tobacco: Never  Vaping Use   Vaping Use: Never used  Substance Use Topics   Alcohol use: Yes    Alcohol/week: 2.0 standard drinks of alcohol    Types: 2 Standard drinks or equivalent per week    Comment: only on weeks not having treatment   Drug use: No     Colonoscopy:  PAP:  Bone density:  Lipid panel:  No Known Allergies  Current Outpatient Medications  Medication Sig Dispense Refill   amLODipine (NORVASC) 5 MG tablet Take 1 tablet (5 mg total) by mouth daily. 90 tablet 1   aspirin EC 81 MG tablet Take 81 mg by mouth daily.     benazepril (LOTENSIN) 20 MG tablet Take 1 tablet (20 mg total) by mouth daily. 90 tablet 1   ibuprofen (ADVIL) 200 MG tablet Take 200 mg by mouth every 6 (six) hours as needed.     methimazole (TAPAZOLE) 5 MG tablet Take 10 mg by mouth daily.     metoprolol succinate (TOPROL-XL) 50 MG 24 hr tablet Take 1 tablet (50 mg total) by mouth daily. Take with or immediately following a meal. 90 tablet 1   Multiple Vitamin (MULTI-VITAMINS) TABS Take 1 tablet by mouth daily.      Probiotic Product (PROBIOTIC ADVANCED PO) Take 1-2 capsules by mouth daily. When taking antibiotics pt will take 2 capsules instead of 1, normally just takes 1 per day otherwise     silver sulfADIAZINE (SILVADENE) 1 % cream Apply 1 application topically daily. 50 g 0   Sodium Hyaluronate, oral, (HYALURONIC ACID PO) Take 1 tablet by mouth daily.     vitamin B-12 (CYANOCOBALAMIN) 1000 MCG tablet Take 1,000 mcg by mouth daily.     No current facility-administered medications for this visit.   Facility-Administered Medications Ordered in Other Visits  Medication Dose Route Frequency Provider Last Rate Last Admin   sodium chloride flush (NS) 0.9 % injection 10 mL  10 mL Intravenous PRN Jeralyn Ruths, MD   10 mL at 03/07/20 0836   sodium chloride flush (NS) 0.9 % injection 10 mL  10 mL Intravenous PRN  Jeralyn Ruths, MD   10 mL at 06/21/20 1259    OBJECTIVE: Vitals:   02/20/22 1048  BP: (!) 152/91  Pulse: 91  Resp: 18  Temp: 98 F (36.7 C)  SpO2: 97%     Body mass index is 30.84 kg/m.    ECOG FS:0 - Asymptomatic  General: Well-developed, well-nourished, no acute distress. Eyes: Pink conjunctiva, anicteric sclera. HEENT: Normocephalic, moist mucous membranes. Lungs: No audible wheezing or coughing. Heart: Regular rate and rhythm. Abdomen: Soft, nontender, no obvious distention. Musculoskeletal: No edema, cyanosis, or clubbing. Neuro: Alert, answering all questions appropriately. Cranial nerves grossly intact. Skin: No rashes or petechiae noted. Psych: Normal affect.   LAB RESULTS:  Lab Results  Component Value Date   NA 142 11/03/2021   K 3.9 11/03/2021   CL 101 11/03/2021   CO2 24 11/03/2021  GLUCOSE 114 (H) 11/03/2021   BUN 8 11/03/2021   CREATININE 0.50 02/12/2022   CALCIUM 10.1 11/03/2021   PROT 6.9 11/03/2021   ALBUMIN 4.7 11/03/2021   AST 19 11/03/2021   ALT 15 11/03/2021   ALKPHOS 103 11/03/2021   BILITOT 0.8 11/03/2021   GFRNONAA >60 03/31/2021   GFRAA >60 03/14/2020    Lab Results  Component Value Date   WBC 6.3 02/20/2022   NEUTROABS 3.9 02/20/2022   HGB 14.5 02/20/2022   HCT 41.0 02/20/2022   MCV 97.6 02/20/2022   PLT 151 02/20/2022     STUDIES: CT ABDOMEN PELVIS W CONTRAST  Result Date: 02/12/2022 CLINICAL DATA:  Follow-up right-sided colon carcinoma. Previous colon resection and chemotherapy. Surveillance. * Tracking Code: BO * EXAM: CT ABDOMEN AND PELVIS WITH CONTRAST TECHNIQUE: Multidetector CT imaging of the abdomen and pelvis was performed using the standard protocol following bolus administration of intravenous contrast. RADIATION DOSE REDUCTION: This exam was performed according to the departmental dose-optimization program which includes automated exposure control, adjustment of the mA and/or kV according to patient size  and/or use of iterative reconstruction technique. CONTRAST:  171m OMNIPAQUE IOHEXOL 300 MG/ML  SOLN COMPARISON:  01/20/2021 FINDINGS: Lower Chest: No acute findings. Hepatobiliary: No hepatic masses identified. A few tiny sub-cm low attention lesions in the right and left lobes are stable, consistent with tiny cysts. Prior cholecystectomy. No evidence of biliary obstruction. Pancreas:  No mass or inflammatory changes. Spleen: Within normal limits in size and appearance. Adrenals/Urinary Tract: No suspicious masses identified. No evidence of ureteral calculi or hydronephrosis. Unremarkable unopacified urinary bladder. Stomach/Bowel: Prior right colectomy again noted. No mass identified. Diffuse colonic diverticulosis is again seen, without evidence of diverticulitis. No evidence of obstruction, inflammatory process or abnormal fluid collections. Vascular/Lymphatic: No pathologically enlarged lymph nodes. No acute vascular findings. Aortic atherosclerotic calcification incidentally noted. Reproductive:  No mass or other significant abnormality. Other:  None. Musculoskeletal:  No suspicious bone lesions identified. IMPRESSION: Stable exam. No evidence of recurrent or metastatic carcinoma within the abdomen or pelvis. Colonic diverticulosis, without radiographic evidence of diverticulitis. Aortic Atherosclerosis (ICD10-I70.0). Electronically Signed   By: JMarlaine HindM.D.   On: 02/12/2022 13:13   MM 3D SCREEN BREAST BILATERAL  Result Date: 02/06/2022 CLINICAL DATA:  Screening. EXAM: DIGITAL SCREENING BILATERAL MAMMOGRAM WITH TOMOSYNTHESIS AND CAD TECHNIQUE: Bilateral screening digital craniocaudal and mediolateral oblique mammograms were obtained. Bilateral screening digital breast tomosynthesis was performed. The images were evaluated with computer-aided detection. COMPARISON:  Previous exam(s). ACR Breast Density Category c: The breast tissue is heterogeneously dense, which may obscure small masses. FINDINGS:  There are no findings suspicious for malignancy. IMPRESSION: No mammographic evidence of malignancy. A result letter of this screening mammogram will be mailed directly to the patient. RECOMMENDATION: Screening mammogram in one year. (Code:SM-B-01Y) BI-RADS CATEGORY  1: Negative. Electronically Signed   By: DLovey NewcomerM.D.   On: 02/06/2022 07:27   CT Head Wo Contrast  Result Date: 01/25/2022 CLINICAL DATA:  Fall EXAM: CT HEAD WITHOUT CONTRAST CT CERVICAL SPINE WITHOUT CONTRAST TECHNIQUE: Multidetector CT imaging of the head and cervical spine was performed following the standard protocol without intravenous contrast. Multiplanar CT image reconstructions of the cervical spine were also generated. RADIATION DOSE REDUCTION: This exam was performed according to the departmental dose-optimization program which includes automated exposure control, adjustment of the mA and/or kV according to patient size and/or use of iterative reconstruction technique. COMPARISON:  CT head 03/24/2021 FINDINGS: CT HEAD FINDINGS Brain: Ventricle size normal.  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 Negative for acute infarct, hemorrhage, mass Vascular: Negative for hyperdense vessel Skull: Negative for fracture. Sinuses/Orbits: Paranasal sinuses clear. Bilateral cataract extraction. Other: Left periorbital soft tissue hematoma. CT CERVICAL SPINE FINDINGS Alignment: Mild anterolisthesis C3-4. Straightening of the cervical lordosis Skull base and vertebrae: Negative for fracture or mass Soft tissues and spinal canal: 22 mm right thyroid nodule, isodense to thyroid. No enlarged lymph nodes in the neck. Disc levels: Multilevel disc and facet degeneration throughout the cervical spine. Moderate spinal stenosis C4-5 due to spurring and calcified disc. Moderate spinal stenosis C5-6 due to uncinate spurring and calcified central disc. Mild spinal stenosis at C6-7. Bilateral foraminal stenosis  at these levels due to spurring. Upper chest: Lung apices clear bilaterally. Left-sided transvenous pacemaker Other: None IMPRESSION: 1. No acute intracranial abnormality. Left periorbital soft tissue hematoma 2. Negative for cervical spine fracture. Multilevel spondylosis and spinal stenosis 3. 22 mm right thyroid nodule. Recommend thyroid ultrasound. (Ref: J Am Coll Radiol. 2015 Feb;12(2): 143-50). Electronically Signed   By: Franchot Gallo M.D.   On: 01/25/2022 16:36   CT Cervical Spine Wo Contrast  Result Date: 01/25/2022 CLINICAL DATA:  Fall EXAM: CT HEAD WITHOUT CONTRAST CT CERVICAL SPINE WITHOUT CONTRAST TECHNIQUE: Multidetector CT imaging of the head and cervical spine was performed following the standard protocol without intravenous contrast. Multiplanar CT image reconstructions of the cervical spine were also generated. RADIATION DOSE REDUCTION: This exam was performed according to the departmental dose-optimization program which includes automated exposure control, adjustment of the mA and/or kV according to patient size and/or use of iterative reconstruction technique. COMPARISON:  CT head 03/24/2021 FINDINGS: CT HEAD FINDINGS Brain: Ventricle size normal. 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 Negative for acute infarct, hemorrhage, mass Vascular: Negative for hyperdense vessel Skull: Negative for fracture. Sinuses/Orbits: Paranasal sinuses clear. Bilateral cataract extraction. Other: Left periorbital soft tissue hematoma. CT CERVICAL SPINE FINDINGS Alignment: Mild anterolisthesis C3-4. Straightening of the cervical lordosis Skull base and vertebrae: Negative for fracture or mass Soft tissues and spinal canal: 22 mm right thyroid nodule, isodense to thyroid. No enlarged lymph nodes in the neck. Disc levels: Multilevel disc and facet degeneration throughout the cervical spine. Moderate spinal stenosis C4-5 due to spurring and calcified disc.  Moderate spinal stenosis C5-6 due to uncinate spurring and calcified central disc. Mild spinal stenosis at C6-7. Bilateral foraminal stenosis at these levels due to spurring. Upper chest: Lung apices clear bilaterally. Left-sided transvenous pacemaker Other: None IMPRESSION: 1. No acute intracranial abnormality. Left periorbital soft tissue hematoma 2. Negative for cervical spine fracture. Multilevel spondylosis and spinal stenosis 3. 22 mm right thyroid nodule. Recommend thyroid ultrasound. (Ref: J Am Coll Radiol. 2015 Feb;12(2): 143-50). Electronically Signed   By: Franchot Gallo M.D.   On: 01/25/2022 16:36    ASSESSMENT: Stage IIIb right colon cancer  PLAN:    1. Stage IIIb right colon cancer: Pathology results reviewed, confirming stage of disease.  Port has been removed.  She only received 11 treatments of FOLFOX secondary to declining performance status completing on April 25, 2020.  Patient underwent colonoscopy in June 2023 where 3 polyps were removed.  She reports she has a repeat colonoscopy in October 2023.  Imaging results with CT scan from February 12, 2022 reviewed independently and reported as above with no obvious evidence of recurrent or progressive disease.  CEA continues to be within normal limits.  No intervention is needed at this time.  We will continue to yearly imaging.  Return to clinic in 6 months with repeat laboratory work and further evaluation.   2.  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  She completed 5 years of anastrozole in June 2016.  Her most recent mammogram on February 05, 2022 was reported as BI-RADS 1.  Repeat in August 2024.   3.  Anemia: Resolved. 4.  Thrombocytopenia: Resolved. 5.  Heart disease: Continue follow-up at [facility] as scheduled.  6.  Guillain-Barr: Unclear etiology.  Patient reports possibly related to flu shot.  Patient's symptoms have resolved.  Patient  expressed understanding and was in agreement with this plan. She also understands that She can call clinic at any time with any questions, concerns, or complaints.    Cancer Staging  Colon cancer [facility]) Staging form: Colon and Rectum, AJCC 8th Edition - Clinical stage from 11/13/2019: Stage IIIB (cT3, cN1a, cM0) - Signed by Lloyd Huger, MD on 11/13/2019 Total positive nodes: 1   Lloyd Huger, MD   02/22/2022 7:12 AM

## 2022-02-20 ENCOUNTER — Inpatient Hospital Stay: Payer: Medicare Other | Attending: Oncology

## 2022-02-20 ENCOUNTER — Encounter: Payer: Self-pay | Admitting: Oncology

## 2022-02-20 ENCOUNTER — Inpatient Hospital Stay (HOSPITAL_BASED_OUTPATIENT_CLINIC_OR_DEPARTMENT_OTHER): Payer: Medicare Other | Admitting: Oncology

## 2022-02-20 VITALS — BP 152/91 | HR 91 | Temp 98.0°F | Resp 18 | Wt 174.1 lb

## 2022-02-20 DIAGNOSIS — Z803 Family history of malignant neoplasm of breast: Secondary | ICD-10-CM | POA: Diagnosis not present

## 2022-02-20 DIAGNOSIS — C182 Malignant neoplasm of ascending colon: Secondary | ICD-10-CM

## 2022-02-20 DIAGNOSIS — Z7982 Long term (current) use of aspirin: Secondary | ICD-10-CM | POA: Diagnosis not present

## 2022-02-20 DIAGNOSIS — E041 Nontoxic single thyroid nodule: Secondary | ICD-10-CM | POA: Insufficient documentation

## 2022-02-20 DIAGNOSIS — Z923 Personal history of irradiation: Secondary | ICD-10-CM | POA: Insufficient documentation

## 2022-02-20 DIAGNOSIS — Z9221 Personal history of antineoplastic chemotherapy: Secondary | ICD-10-CM | POA: Insufficient documentation

## 2022-02-20 DIAGNOSIS — Z85828 Personal history of other malignant neoplasm of skin: Secondary | ICD-10-CM | POA: Insufficient documentation

## 2022-02-20 DIAGNOSIS — Z79899 Other long term (current) drug therapy: Secondary | ICD-10-CM | POA: Insufficient documentation

## 2022-02-20 DIAGNOSIS — I700 Atherosclerosis of aorta: Secondary | ICD-10-CM | POA: Insufficient documentation

## 2022-02-20 DIAGNOSIS — M4802 Spinal stenosis, cervical region: Secondary | ICD-10-CM | POA: Diagnosis not present

## 2022-02-20 DIAGNOSIS — I10 Essential (primary) hypertension: Secondary | ICD-10-CM | POA: Insufficient documentation

## 2022-02-20 DIAGNOSIS — M47812 Spondylosis without myelopathy or radiculopathy, cervical region: Secondary | ICD-10-CM | POA: Diagnosis not present

## 2022-02-20 DIAGNOSIS — M4312 Spondylolisthesis, cervical region: Secondary | ICD-10-CM | POA: Diagnosis not present

## 2022-02-20 DIAGNOSIS — M8580 Other specified disorders of bone density and structure, unspecified site: Secondary | ICD-10-CM | POA: Diagnosis not present

## 2022-02-20 DIAGNOSIS — E039 Hypothyroidism, unspecified: Secondary | ICD-10-CM | POA: Diagnosis not present

## 2022-02-20 LAB — CBC WITH DIFFERENTIAL/PLATELET
Abs Immature Granulocytes: 0.04 10*3/uL (ref 0.00–0.07)
Basophils Absolute: 0 10*3/uL (ref 0.0–0.1)
Basophils Relative: 1 %
Eosinophils Absolute: 0.2 10*3/uL (ref 0.0–0.5)
Eosinophils Relative: 3 %
HCT: 41 % (ref 36.0–46.0)
Hemoglobin: 14.5 g/dL (ref 12.0–15.0)
Immature Granulocytes: 1 %
Lymphocytes Relative: 25 %
Lymphs Abs: 1.6 10*3/uL (ref 0.7–4.0)
MCH: 34.5 pg — ABNORMAL HIGH (ref 26.0–34.0)
MCHC: 35.4 g/dL (ref 30.0–36.0)
MCV: 97.6 fL (ref 80.0–100.0)
Monocytes Absolute: 0.6 10*3/uL (ref 0.1–1.0)
Monocytes Relative: 10 %
Neutro Abs: 3.9 10*3/uL (ref 1.7–7.7)
Neutrophils Relative %: 60 %
Platelets: 151 10*3/uL (ref 150–400)
RBC: 4.2 MIL/uL (ref 3.87–5.11)
RDW: 12.8 % (ref 11.5–15.5)
WBC: 6.3 10*3/uL (ref 4.0–10.5)
nRBC: 0 % (ref 0.0–0.2)

## 2022-02-20 NOTE — Progress Notes (Signed)
Patient denies any concerns today.  

## 2022-02-21 LAB — CEA: CEA: 1.7 ng/mL (ref 0.0–4.7)

## 2022-02-22 ENCOUNTER — Encounter: Payer: Self-pay | Admitting: Oncology

## 2022-02-27 DIAGNOSIS — E0590 Thyrotoxicosis, unspecified without thyrotoxic crisis or storm: Secondary | ICD-10-CM | POA: Diagnosis not present

## 2022-03-20 ENCOUNTER — Encounter: Payer: Self-pay | Admitting: Gastroenterology

## 2022-03-20 ENCOUNTER — Other Ambulatory Visit: Payer: Self-pay

## 2022-03-20 ENCOUNTER — Encounter
Admission: RE | Disposition: A | Discharge status: Home / Self Care | Payer: Self-pay | Source: Home / Self Care | Attending: Gastroenterology

## 2022-03-20 ENCOUNTER — Ambulatory Visit
Admission: RE | Admit: 2022-03-20 | Discharge: 2022-03-20 | Disposition: A | Discharge status: Home / Self Care | Payer: Medicare Other | Attending: Gastroenterology | Admitting: Gastroenterology

## 2022-03-20 ENCOUNTER — Ambulatory Visit: Payer: Medicare Other | Admitting: Anesthesiology

## 2022-03-20 DIAGNOSIS — K621 Rectal polyp: Secondary | ICD-10-CM

## 2022-03-20 DIAGNOSIS — I10 Essential (primary) hypertension: Secondary | ICD-10-CM | POA: Diagnosis not present

## 2022-03-20 DIAGNOSIS — I499 Cardiac arrhythmia, unspecified: Secondary | ICD-10-CM | POA: Diagnosis not present

## 2022-03-20 DIAGNOSIS — D128 Benign neoplasm of rectum: Secondary | ICD-10-CM | POA: Insufficient documentation

## 2022-03-20 DIAGNOSIS — Z950 Presence of cardiac pacemaker: Secondary | ICD-10-CM | POA: Insufficient documentation

## 2022-03-20 DIAGNOSIS — Z86010 Personal history of colonic polyps: Secondary | ICD-10-CM | POA: Diagnosis not present

## 2022-03-20 DIAGNOSIS — E039 Hypothyroidism, unspecified: Secondary | ICD-10-CM | POA: Diagnosis not present

## 2022-03-20 DIAGNOSIS — Z09 Encounter for follow-up examination after completed treatment for conditions other than malignant neoplasm: Secondary | ICD-10-CM | POA: Insufficient documentation

## 2022-03-20 HISTORY — PX: FLEXIBLE SIGMOIDOSCOPY: SHX5431

## 2022-03-20 HISTORY — DX: Polyneuropathy, unspecified: G62.9

## 2022-03-20 SURGERY — SIGMOIDOSCOPY, FLEXIBLE
Anesthesia: General

## 2022-03-20 MED ORDER — PROPOFOL 500 MG/50ML IV EMUL
INTRAVENOUS | Status: DC | PRN
  Administered 2022-03-20: 120 ug/kg/min via INTRAVENOUS

## 2022-03-20 MED ORDER — LIDOCAINE HCL (PF) 2 % IJ SOLN
INTRAMUSCULAR | Status: AC
  Filled 2022-03-20: qty 5

## 2022-03-20 MED ORDER — LIDOCAINE HCL (CARDIAC) PF 100 MG/5ML IV SOSY
PREFILLED_SYRINGE | INTRAVENOUS | Status: DC | PRN
  Administered 2022-03-20: 50 mg via INTRAVENOUS

## 2022-03-20 MED ORDER — DEXMEDETOMIDINE HCL IN NACL 200 MCG/50ML IV SOLN
INTRAVENOUS | Status: DC | PRN
  Administered 2022-03-20: 4 ug via INTRAVENOUS
  Administered 2022-03-20: 12 ug via INTRAVENOUS
  Administered 2022-03-20: 8 ug via INTRAVENOUS

## 2022-03-20 MED ORDER — PROPOFOL 10 MG/ML IV BOLUS
INTRAVENOUS | Status: DC | PRN
  Administered 2022-03-20: 20 mg via INTRAVENOUS
  Administered 2022-03-20: 80 mg via INTRAVENOUS

## 2022-03-20 MED ORDER — SODIUM CHLORIDE 0.9 % IV SOLN: INTRAVENOUS | Status: DC

## 2022-03-20 NOTE — Transfer of Care (Signed)
Immediate Anesthesia Transfer of Care Note  Patient: Maria Davies  Procedure(s) Performed: FLEXIBLE SIGMOIDOSCOPY  Patient Location: PACU and Endoscopy Unit  Anesthesia Type:General  Level of Consciousness: drowsy and patient cooperative  Airway & Oxygen Therapy: Patient Spontanous Breathing  Post-op Assessment: Report given to RN and Post -op Vital signs reviewed and stable  Post vital signs: Reviewed and stable  Last Vitals:  Vitals Value Taken Time  BP 107/64 03/20/22 0834  Temp    Pulse 74 03/20/22 0834  Resp 32 03/20/22 0835  SpO2 96 % 03/20/22 0834  Vitals shown include unvalidated device data.  Last Pain:  Vitals:   03/20/22 0749  TempSrc: Temporal  PainSc: 0-No pain         Complications: No notable events documented.

## 2022-03-20 NOTE — Op Note (Signed)
[facility] Gastroenterology Patient Name: Maria Davies Procedure Date: 03/20/2022 8:00 AM MRN: [id number] Account #: [id number] Date of Birth: [birth year] Admit Type: Outpatient Age: 81 Room: [facility] ENDO ROOM 4 Gender: Female Note Status: Finalized Instrument Name: Jasper Riling [id number] Procedure:             Flexible Sigmoidoscopy Indications:           High risk colon cancer surveillance: Personal history                         of colonic polyps Providers:             Lucilla Lame MD, MD Referring MD:          Valerie Roys (Referring MD) Medicines:             Propofol per Anesthesia Complications:         No immediate complications. Procedure:             Pre-Anesthesia Assessment:                        - Prior to the procedure, a History and Physical was                         performed, and patient medications and allergies were                         reviewed. The patient's tolerance of previous                         anesthesia was also reviewed. The risks and benefits                         of the procedure and the sedation options and risks                         were discussed with the patient. All questions were                         answered, and informed consent was obtained. Prior                         Anticoagulants: The patient has taken no previous                         anticoagulant or antiplatelet agents. ASA Grade                         Assessment: II - A patient with mild systemic disease.                         After reviewing the risks and benefits, the patient                         was deemed in satisfactory condition to undergo the                         procedure.  After obtaining informed consent, the scope was passed                         under direct vision. The Colonoscope was introduced                         through the anus and advanced to the the rectosigmoid                          junction. The flexible sigmoidoscopy was accomplished                         without difficulty. The patient tolerated the                         procedure well. The quality of the bowel preparation                         was excellent. Findings:      The perianal and digital rectal examinations were normal.      A large polyp was found in the rectum. The polyp was sessile. Biopsies       were taken with a cold forceps for histology. Impression:            - One large polyp in the rectum. Biopsied. Recommendation:        - Discharge patient to home.                        - Resume previous diet.                        - Await pathology results. Procedure Code(s):     --- Professional ---                        [phone], 27, Sigmoidoscopy, flexible; with biopsy,                         single or multiple Diagnosis Code(s):     --- Professional ---                        Z86.010, Personal history of colonic polyps CPT copyright 2019 American Medical Association. All rights reserved. The codes documented in this report are preliminary and upon coder review may  be revised to meet current compliance requirements. Lucilla Lame MD, MD 03/20/2022 8:29:21 AM This report has been signed electronically. Number of Addenda: 0 Note Initiated On: 03/20/2022 8:00 AM Total Procedure Duration: 0 hours 5 minutes 14 seconds  Estimated Blood Loss:  Estimated blood loss was minimal.      [facility]

## 2022-03-20 NOTE — Anesthesia Preprocedure Evaluation (Signed)
Anesthesia Evaluation  Patient identified by MRN, date of birth, ID band Patient awake    Reviewed: Allergy & Precautions, NPO status , Patient's Chart, lab work & pertinent test results  History of Anesthesia Complications Negative for: history of anesthetic complications  Airway Mallampati: III  TM Distance: <3 FB Neck ROM: full    Dental  (+) Chipped   Pulmonary neg pulmonary ROS, neg shortness of breath,    Pulmonary exam normal        Cardiovascular Exercise Tolerance: Good hypertension, Normal cardiovascular exam+ dysrhythmias + pacemaker + Valvular Problems/Murmurs      Neuro/Psych  Neuromuscular disease negative psych ROS   GI/Hepatic negative GI ROS, Neg liver ROS,   Endo/Other  Hypothyroidism Hyperthyroidism   Renal/GU negative Renal ROS  negative genitourinary   Musculoskeletal   Abdominal   Peds  Hematology negative hematology ROS (+)   Anesthesia Other Findings Past Medical History: No date: Anemia No date: Arthritis No date: Basal cell carcinoma     Comment:  back 2010: Breast cancer ([place])     Comment:  RT LUMPECTOMY No date: Broken humerus     Comment:  left arm, march 2023 No date: Cancer [facility])     Comment:  rt breast No date: Chemotherapy induced nausea and vomiting 2021: Colon cancer ([place])     Comment:  colon cancer No date: Heart murmur No date: Hypertension No date: Hypothyroidism     Comment:  currently not on meds No date: Neuropathy No date: Osteopenia 2010: Personal history of chemotherapy     Comment:  right breast ca 2010: Personal history of radiation therapy     Comment:  right breast ca No date: Port-A-Cath in place  Past Surgical History: 08/11/2020: aortic valve stenosis No date: BACK SURGERY 03/2009: BREAST EXCISIONAL BIOPSY; Right     Comment:  radiation and chemo + No date: BREAST EXCISIONAL BIOPSY; Bilateral     Comment:  NEG No date: BREAST SURGERY No date:  CATARACT EXTRACTION W/ INTRAOCULAR LENS  IMPLANT, BILATERAL No date: CHOLECYSTECTOMY 10/19/2019: COLONOSCOPY WITH PROPOFOL; N/A     Comment:  Procedure: COLONOSCOPY WITH PROPOFOL-attempted, unable               to perform poor prep;  Surgeon: Lucilla Lame, MD;                Location: [place];  Service: Endoscopy;                Laterality: N/A;  priority 3 10/20/2019: COLONOSCOPY WITH PROPOFOL; N/A     Comment:  Procedure: COLONOSCOPY WITH PROPOFOL;  Surgeon: Lucilla Lame, MD;  Location: [facility] ENDOSCOPY;  Service:               Endoscopy;  Laterality: N/A; 11/16/2021: COLONOSCOPY WITH PROPOFOL; N/A     Comment:  Procedure: COLONOSCOPY WITH PROPOFOL;  Surgeon: Lucilla Lame, MD;  Location: [facility] ENDOSCOPY;  Service:               Endoscopy;  Laterality: N/A; 10/19/2019: ESOPHAGEAL DILATION     Comment:  Procedure: ESOPHAGEAL DILATION;  Surgeon: Lucilla Lame,               MD;  Location: [place];  Service: Endoscopy;; 10/19/2019: ESOPHAGOGASTRODUODENOSCOPY (EGD) WITH PROPOFOL; N/A     Comment:  Procedure:  ESOPHAGOGASTRODUODENOSCOPY (EGD) WITH               PROPOFOL;  Surgeon: Lucilla Lame, MD;  Location: [place];  Service: Endoscopy;  Laterality: N/A; 11/23/2019: PORTACATH PLACEMENT; Right     Comment:  Procedure: INSERTION PORT-A-CATH;  Surgeon: Ronny Bacon, MD;  Location: [facility] ORS;  Service: General;                Laterality: Right;     Reproductive/Obstetrics negative OB ROS                             Anesthesia Physical Anesthesia Plan  ASA: 3  Anesthesia Plan: General   Post-op Pain Management:    Induction: Intravenous  PONV Risk Score and Plan: Propofol infusion and TIVA  Airway Management Planned: Natural Airway and Nasal Cannula  Additional Equipment:   Intra-op Plan:   Post-operative Plan:   Informed Consent: I have reviewed the patients History and  Physical, chart, labs and discussed the procedure including the risks, benefits and alternatives for the proposed anesthesia with the patient or authorized representative who has indicated his/her understanding and acceptance.     Dental Advisory Given  Plan Discussed with: Anesthesiologist, CRNA and Surgeon  Anesthesia Plan Comments: (Patient consented for risks of anesthesia including but not limited to:  - adverse reactions to medications - risk of airway placement if required - damage to eyes, teeth, lips or other oral mucosa - nerve damage due to positioning  - sore throat or hoarseness - Damage to heart, brain, nerves, lungs, other parts of body or loss of life  Patient voiced understanding.)        Anesthesia Quick Evaluation

## 2022-03-20 NOTE — Anesthesia Postprocedure Evaluation (Signed)
Anesthesia Post Note  Patient: Maria Davies  Procedure(s) Performed: [place]  Patient location during evaluation: Endoscopy Anesthesia Type: General Level of consciousness: awake and alert Pain management: pain level controlled Vital Signs Assessment: post-procedure vital signs reviewed and stable Respiratory status: spontaneous breathing, nonlabored ventilation, respiratory function stable and patient connected to nasal cannula oxygen Cardiovascular status: blood pressure returned to baseline and stable Postop Assessment: no apparent nausea or vomiting Anesthetic complications: no   No notable events documented.   Last Vitals:  Vitals:   03/20/22 0851 03/20/22 0853  BP:  120/67  Pulse:    Resp: 15 (!) 23  Temp:    SpO2:      Last Pain:  Vitals:   03/20/22 0749  TempSrc: Temporal  PainSc: 0-No pain                 Precious Haws Azeez Dunker

## 2022-03-20 NOTE — H&P (Signed)
Lucilla Lame, MD [facility] [address], [place], [place] [address] [phone] Fax : [phone]  Primary Care Physician:  Valerie Roys, DO Primary Gastroenterologist:  Dr. Allen Norris  Pre-Procedure History & Physical: HPI:  Maria Davies is a 81 y.o. female is here for an flexible sigmoidoscopy.   Past Medical History:  Diagnosis Date   Anemia    Arthritis    Basal cell carcinoma    back   Breast cancer ([place]) 2010   RT LUMPECTOMY   Broken humerus    left arm, march 2023   Cancer [facility])    rt breast   Chemotherapy induced nausea and vomiting    Colon cancer ([place]) 2021   colon cancer   Heart murmur    Hypertension    Hypothyroidism    currently not on meds   Neuropathy    Osteopenia    Personal history of chemotherapy 2010   right breast ca   Personal history of radiation therapy 2010   right breast ca   Port-A-Cath in place     Past Surgical History:  Procedure Laterality Date   aortic valve stenosis  08/11/2020   BACK SURGERY     BREAST EXCISIONAL BIOPSY Right 03/2009   radiation and chemo +   BREAST EXCISIONAL BIOPSY Bilateral    NEG   BREAST SURGERY     CATARACT EXTRACTION W/ INTRAOCULAR LENS  IMPLANT, BILATERAL     CHOLECYSTECTOMY     COLONOSCOPY WITH PROPOFOL N/A 10/19/2019   Procedure: COLONOSCOPY WITH PROPOFOL-attempted, unable to perform poor prep;  Surgeon: Lucilla Lame, MD;  Location: [place];  Service: Endoscopy;  Laterality: N/A;  priority 3   COLONOSCOPY WITH PROPOFOL N/A 10/20/2019   Procedure: COLONOSCOPY WITH PROPOFOL;  Surgeon: Lucilla Lame, MD;  Location: [facility] ENDOSCOPY;  Service: Endoscopy;  Laterality: N/A;   COLONOSCOPY WITH PROPOFOL N/A 11/16/2021   Procedure: COLONOSCOPY WITH PROPOFOL;  Surgeon: Lucilla Lame, MD;  Location: [facility] ENDOSCOPY;  Service: Endoscopy;  Laterality: N/A;   ESOPHAGEAL DILATION  10/19/2019   Procedure: ESOPHAGEAL DILATION;  Surgeon: Lucilla Lame, MD;  Location: [place];  Service:  Endoscopy;;   ESOPHAGOGASTRODUODENOSCOPY (EGD) WITH PROPOFOL N/A 10/19/2019   Procedure: ESOPHAGOGASTRODUODENOSCOPY (EGD) WITH PROPOFOL;  Surgeon: Lucilla Lame, MD;  Location: [place];  Service: Endoscopy;  Laterality: N/A;   PORTACATH PLACEMENT Right 11/23/2019   Procedure: INSERTION PORT-A-CATH;  Surgeon: Ronny Bacon, MD;  Location: [facility] ORS;  Service: General;  Laterality: Right;    Prior to Admission medications   Medication Sig Start Date End Date Taking? Authorizing Provider  amLODipine (NORVASC) 5 MG tablet Take 1 tablet (5 mg total) by mouth daily. 11/03/21  Yes Johnson, Megan P, DO  aspirin EC 81 MG tablet Take 81 mg by mouth daily.   Yes [provider]  benazepril (LOTENSIN) 20 MG tablet Take 1 tablet (20 mg total) by mouth daily. 11/03/21  Yes Johnson, Megan P, DO  metoprolol succinate (TOPROL-XL) 50 MG 24 hr tablet Take 1 tablet (50 mg total) by mouth daily. Take with or immediately following a meal. 11/03/21  Yes Johnson, Megan P, DO  ibuprofen (ADVIL) 200 MG tablet Take 200 mg by mouth every 6 (six) hours as needed.    [provider]  methimazole (TAPAZOLE) 5 MG tablet Take 10 mg by mouth daily. 02/27/21   [provider]  Multiple Vitamin (MULTI-VITAMINS) TABS Take 1 tablet by mouth daily.     [provider]  Probiotic  Product (PROBIOTIC ADVANCED PO) Take 1-2 capsules by mouth daily. When taking antibiotics pt will take 2 capsules instead of 1, normally just takes 1 per day otherwise    [provider]  silver sulfADIAZINE (SILVADENE) 1 % cream Apply 1 application topically daily. 07/22/17   Guadalupe Maple, MD  Sodium Hyaluronate, oral, (HYALURONIC ACID PO) Take 1 tablet by mouth daily.    [provider]  vitamin B-12 (CYANOCOBALAMIN) 1000 MCG tablet Take 1,000 mcg by mouth daily.    [provider]    Allergies as of 11/23/2021   (No Known Allergies)    Family History  Problem Relation Age of Onset    Cancer Mother    Heart disease Father    Breast cancer Paternal Aunt 50    Social History   Socioeconomic History   Marital status: Widowed    Spouse name: Not on file   Number of children: Not on file   Years of education: Not on file   Highest education level: Not on file  Occupational History   Occupation: retired   Tobacco Use   Smoking status: Never   Smokeless tobacco: Never  Vaping Use   Vaping Use: Never used  Substance and Sexual Activity   Alcohol use: Yes    Alcohol/week: 2.0 standard drinks of alcohol    Types: 2 Standard drinks or equivalent per week    Comment: only on weeks not having treatment   Drug use: No   Sexual activity: Not Currently  Other Topics Concern   Not on file  Social History Narrative   Not on file   Social Determinants of Health   Financial Resource Strain: Low Risk  (08/21/2021)   Overall Financial Resource Strain (CARDIA)    Difficulty of Paying Living Expenses: Not hard at all  Food Insecurity: No Food Insecurity (08/21/2021)   Hunger Vital Sign    Worried About Running Out of Food in the Last Year: Never true    Ran Out of Food in the Last Year: Never true  Transportation Needs: No Transportation Needs (08/21/2021)   PRAPARE - Hydrologist (Medical): No    Lack of Transportation (Non-Medical): No  Physical Activity: Insufficiently Active (08/21/2021)   Exercise Vital Sign    Days of Exercise per Week: 4 days    Minutes of Exercise per Session: 30 min  Stress: No Stress Concern Present (08/21/2021)   [place]    Feeling of Stress : Not at all  Social Connections: Moderately Isolated (08/21/2021)   Social Connection and Isolation Panel [NHANES]    Frequency of Communication with Friends and Family: More than three times a week    Frequency of Social Gatherings with Friends and Family: More than three times a week    Attends Religious  Services: More than 4 times per year    Active Member of [employer] or Organizations: No    Attends Archivist Meetings: Never    Marital Status: Widowed  Intimate Partner Violence: Not At Risk (08/21/2021)   Humiliation, Afraid, Rape, and Kick questionnaire    Fear of Current or Ex-Partner: No    Emotionally Abused: No    Physically Abused: No    Sexually Abused: No    Review of Systems: See HPI, otherwise negative ROS  Physical Exam: BP (!) 159/75   Pulse 75   Temp (!) 96.9 F (36.1 C) (Temporal)   Ht  $'5\' 3"'Z$  (1.6 m)   Wt 77.1 kg   SpO2 97%   BMI 30.11 kg/m  General:   Alert,  pleasant and cooperative in NAD Head:  Normocephalic and atraumatic. Neck:  Supple; no masses or thyromegaly. Lungs:  Clear throughout to auscultation.    Heart:  Regular rate and rhythm. Abdomen:  Soft, nontender and nondistended. Normal bowel sounds, without guarding, and without rebound.   Neurologic:  Alert and  oriented x4;  grossly normal neurologically.  Impression/Plan: Maria Davies is here for an flexible sigmoidoscopy to be performed for adenomatous polyps 11/2021  Risks, benefits, limitations, and alternatives regarding  flexible sigmoidoscopy have been reviewed with the patient.  Questions have been answered.  All parties agreeable.   Lucilla Lame, MD  03/20/2022, 7:58 AM

## 2022-03-21 ENCOUNTER — Encounter: Payer: Self-pay | Admitting: Gastroenterology

## 2022-03-21 LAB — SURGICAL PATHOLOGY

## 2022-03-23 ENCOUNTER — Other Ambulatory Visit: Payer: Self-pay

## 2022-03-23 DIAGNOSIS — K621 Rectal polyp: Secondary | ICD-10-CM

## 2022-03-26 NOTE — H&P (View-Only) (Signed)
Patient ID: Maria Davies, female   DOB: 1940/09/23, 81 y.o.   MRN: [id number]  Chief Complaint: Rectal polyp  History of Present Illness Maria Davies is a 81 y.o. female with a biopsy sessile rectal polyp, serrated on pathology noted by Dr. Verl Blalock on his most recent flexible sigmoidoscopy.  Seemingly associated or top of the hemorrhoid, obviously distal rectal.  She believes this is a prolapsing hemorrhoid/rectal polyp, which spontaneously reduces.  She has had very mild bleeding.  Prior history of right colectomy, for colon cancer 10/2019.  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   Heart murmur    Hypertension    Hypothyroidism    currently not on meds   Neuropathy    Osteopenia    Personal history of chemotherapy 2010   right breast ca   Personal history of radiation therapy 2010   right breast ca   Port-A-Cath in place       Past Surgical History:  Procedure Laterality Date   aortic valve stenosis  08/11/2020   BACK SURGERY     BREAST EXCISIONAL BIOPSY Right 03/2009   radiation and chemo +   BREAST EXCISIONAL BIOPSY Bilateral    NEG   BREAST SURGERY     CATARACT EXTRACTION W/ INTRAOCULAR LENS  IMPLANT, BILATERAL     CHOLECYSTECTOMY     COLON SURGERY Right 10/2019   Robotic assisted right hemicolectomy.   COLONOSCOPY WITH PROPOFOL N/A 10/19/2019   Procedure: COLONOSCOPY WITH PROPOFOL-attempted, unable to perform poor prep;  Surgeon: Lucilla Lame, MD;  Location: [place];  Service: Endoscopy;  Laterality: N/A;  priority 3   COLONOSCOPY WITH PROPOFOL N/A 10/20/2019   Procedure: COLONOSCOPY WITH PROPOFOL;  Surgeon: Lucilla Lame, MD;  Location: [facility]  ENDOSCOPY;  Service: Endoscopy;  Laterality: N/A;   COLONOSCOPY WITH PROPOFOL N/A 11/16/2021   Procedure: COLONOSCOPY WITH PROPOFOL;  Surgeon: Lucilla Lame, MD;  [facility] ENDOSCOPY;  Service: Endoscopy;  Laterality: N/A;   ESOPHAGEAL DILATION  10/19/2019   Procedure: ESOPHAGEAL DILATION;  Surgeon: Lucilla Lame, MD;  Location: [place];  Service: Endoscopy;;   ESOPHAGOGASTRODUODENOSCOPY (EGD) WITH PROPOFOL N/A 10/19/2019   Procedure: ESOPHAGOGASTRODUODENOSCOPY (EGD) WITH PROPOFOL;  Surgeon: Lucilla Lame, MD;  Location: [place];  Service: Endoscopy;  Laterality: N/A;   FLEXIBLE SIGMOIDOSCOPY N/A 03/20/2022   Procedure: FLEXIBLE SIGMOIDOSCOPY;  Surgeon: Lucilla Lame, MD;  Location: [facility] ENDOSCOPY;  Service: Endoscopy;  Laterality: N/A;   PORTACATH PLACEMENT Right 11/23/2019   Procedure: INSERTION PORT-A-CATH;  Surgeon: Ronny Bacon, MD;  Location: [facility] ORS;  Service: General;  Laterality: Right;    No Known Allergies  Current Outpatient Medications  Medication Sig Dispense Refill   amLODipine (NORVASC) 5 MG tablet Take 1 tablet (5 mg total) by mouth daily. 90 tablet 1   aspirin EC 81 MG tablet Take 81 mg by mouth daily.     benazepril (LOTENSIN) 20 MG tablet Take 1 tablet (20 mg total) by mouth daily. 90 tablet 1   ibuprofen (ADVIL) 200 MG tablet Take 200 mg by mouth every 6 (six)  hours as needed.     methimazole (TAPAZOLE) 5 MG tablet Take 10 mg by mouth daily.     metoprolol succinate (TOPROL-XL) 50 MG 24 hr tablet Take 1 tablet (50 mg total) by mouth daily. Take with or immediately following a meal. 90 tablet 1   Multiple Vitamin (MULTI-VITAMINS) TABS Take 1 tablet by mouth daily.      Probiotic Product (PROBIOTIC ADVANCED PO) Take 1-2 capsules by mouth daily. When taking antibiotics pt will take 2 capsules instead of 1, normally just takes 1 per day otherwise     silver sulfADIAZINE (SILVADENE) 1 % cream Apply 1 application topically daily. 50 g 0   Sodium  Hyaluronate, oral, (HYALURONIC ACID PO) Take 1 tablet by mouth daily.     vitamin B-12 (CYANOCOBALAMIN) 1000 MCG tablet Take 1,000 mcg by mouth daily.     No current facility-administered medications for this visit.   Facility-Administered Medications Ordered in Other Visits  Medication Dose Route Frequency Provider Last Rate Last Admin   sodium chloride flush (NS) 0.9 % injection 10 mL  10 mL Intravenous PRN Lloyd Huger, MD   10 mL at 03/07/20 0836   sodium chloride flush (NS) 0.9 % injection 10 mL  10 mL Intravenous PRN Lloyd Huger, MD   10 mL at 06/21/20 1259    Family History Family History  Problem Relation Age of Onset   Cancer Mother    Heart disease Father    Breast cancer Paternal Aunt 74      Social History Social History   Tobacco Use   Smoking status: Never    Passive exposure: Never   Smokeless tobacco: Never  Vaping Use   Vaping Use: Never used  Substance Use Topics   Alcohol use: Yes    Alcohol/week: 2.0 standard drinks of alcohol    Types: 2 Standard drinks or equivalent per week    Comment: only on weeks not having treatment   Drug use: No        Review of Systems  Constitutional:  Negative for chills, fever and weight loss.  HENT: Negative.    Eyes: Negative.   Respiratory: Negative.    Cardiovascular: Negative.   Gastrointestinal:  Positive for blood in stool.  Genitourinary: Negative.   Skin: Negative.   Neurological: Negative.   Psychiatric/Behavioral: Negative.        Physical Exam Blood pressure (!) 140/80, pulse 95, temperature 98.9 F (37.2 C), height '5\' 3"'$  (1.6 m), weight 185 lb (83.9 kg), SpO2 98 %. Last Weight  Most recent update: 03/29/2022 10:10 AM    Weight  83.9 kg (185 lb)             CONSTITUTIONAL: Well developed, and nourished, appropriately responsive and aware without distress.   EYES: Sclera non-icteric.   EARS, NOSE, MOUTH AND THROAT:  The oropharynx is clear. Oral mucosa is pink and moist.    Hearing is intact to voice.  NECK: Trachea is midline, and there is no jugular venous distension.  LYMPH NODES:  Lymph nodes in the neck are not enlarged. RESPIRATORY:  Lungs are clear, and breath sounds are equal bilaterally. Normal respiratory effort without pathologic use of accessory muscles. CARDIOVASCULAR: Heart is regular in rate and rhythm. GI: The abdomen is soft, nontender, and nondistended. There were no palpable masses. I did not appreciate hepatosplenomegaly. There were normal bowel sounds. GU: DRE confirms a right-sided mobile hemorrhoid/polyp.  A tiny internal tag noted at the opposite quadrant left posterior.  Otherwise no other distal rectal masses. MUSCULOSKELETAL:  Symmetrical muscle tone appreciated in all four extremities.    SKIN: Skin turgor is normal. No pathologic skin lesions appreciated.  NEUROLOGIC:  Motor and sensation appear grossly normal.  Cranial nerves are grossly without defect. PSYCH:  Alert and oriented to person, place and time. Affect is appropriate for situation.  Data Reviewed I have personally reviewed what is currently available of the patient's imaging, recent labs and medical records.   Labs:     Latest Ref Rng & Units 02/20/2022   10:24 AM 11/03/2021    9:27 AM 08/16/2021   10:03 AM  CBC  WBC 4.0 - 10.5 K/uL 6.3  5.8  6.4   Hemoglobin 12.0 - 15.0 g/dL 14.5  15.0  14.2   Hematocrit 36.0 - 46.0 % 41.0  43.6  40.8   Platelets 150 - 400 K/uL 151  165  161       Latest Ref Rng & Units 02/12/2022   10:10 AM 11/03/2021    9:27 AM 06/05/2021    2:15 PM  CMP  Glucose 70 - 99 mg/dL  114  103   BUN 8 - 27 mg/dL  8  13   Creatinine 0.44 - 1.00 mg/dL 0.50  0.48  0.39   Sodium 134 - 144 mmol/L  142  140   Potassium 3.5 - 5.2 mmol/L  3.9  3.6   Chloride 96 - 106 mmol/L  101  103   CO2 20 - 29 mmol/L  24  22   Calcium 8.7 - 10.3 mg/dL  10.1  9.5   Total Protein 6.0 - 8.5 g/dL  6.9    Total Bilirubin 0.0 - 1.2 mg/dL  0.8    Alkaline Phos 44 - 121 IU/L   103    AST 0 - 40 IU/L  19    ALT 0 - 32 IU/L  15     SURGICAL PATHOLOGY CASE: [id number] PATIENT: [name] Surgical Pathology Report  Specimen Submitted: A. Rectum polyp; cbx  Clinical History: History of colon polyps Z86.010.  Rectal polyp  DIAGNOSIS: A.  RECTUM, POLYP; BIOPSIES: - SESSILE SERRATED POLYP. - FOCAL SUPERIMPOSED MUCOSAL PROLAPSE TYPE CHANGE. - NO EVIDENCE OF HIGH-GRADE DYSPLASIA OR MALIGNANCY.  GROSS DESCRIPTION: A. Labeled: cbx rectal polyp Received: Formalin Collection time: 7:42 AM on 03/20/2022 Placed into formalin time: 7:42 AM on 03/20/2022 Tissue fragment(s): Multiple Size: Aggregate, 0.9 x 0.5 x 0.2 cm Description: Tan-pink soft tissue fragments Entirely submitted in 1 cassette.  RB 03/20/2022  Final Diagnosis performed by Theodora Blow, MD.   Electronically signed 03/21/2022 1:16:34PM The electronic signature indicates that the named Attending Pathologist has evaluated the specimen Technical component performed at [facility], [address], [place], [place] [address] Lab: [phone] Dir: Rush Farmer, MD, MMM  Professional component performed at [facility], [facility], [place], [place], [place] [address] Lab: [phone] Dir: Kathi Simpers, MD    Imaging:  Within last 24 hrs: No results found.  Assessment    Distal rectal polyp, serrated seemingly adjacent to prolapsing/mobile hemorrhoid. 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   Goals of care, counseling/discussion 11/19/2019   Status post laparoscopic colectomy 11/17/2019   Colon cancer ([place]) 10/28/2019   Iron deficiency anemia secondary to blood loss (chronic)    Polyp of sigmoid colon    Benign neoplasm of cecum    Intestines neoplasm    Stricture and stenosis of esophagus    Advanced care planning/counseling discussion 07/22/2017   Hypercholesteremia 07/03/2016   Osteoporosis 07/03/2016   Carcinoma of right breast ([place]) 06/19/2016   Aortic stenosis, mild 05/02/2015   Hypertension 05/02/2015   Hyperthyroidism 05/02/2015   Breast cancer in female [facility]) 05/02/2015    Plan    Transanal excision of rectal polyp..  Risks of anesthesia, prone positioning, potential anal sphincteric dysfunction, bleeding, infection, recurrence or need for additional procedures based on pathology.  These were all discussed with the patient.  I believe she understands these are not all inclusive.  Questions answered.  No guarantees were expressed or implied.  Face-to-face time spent with the patient and accompanying care providers(if present) was 30 minutes, with more than 50% of the time spent counseling, educating, and coordinating care of the patient.    These notes generated with voice recognition software. I apologize for typographical errors.  Ronny Bacon M.D., FACS 03/29/2022, 10:57 AM

## 2022-03-26 NOTE — Progress Notes (Signed)
Patient ID: Maria Davies, female   DOB: 1941-04-21, 81 y.o.   MRN: [id number]  Chief Complaint: Rectal polyp  History of Present Illness Maria Davies is a 81 y.o. female with a biopsy sessile rectal polyp, serrated on pathology noted by Dr. Verl Blalock on his most recent flexible sigmoidoscopy.  Seemingly associated or top of the hemorrhoid, obviously distal rectal.  She believes this is a prolapsing hemorrhoid/rectal polyp, which spontaneously reduces.  She has had very mild bleeding.  Prior history of right colectomy, for colon cancer 10/2019.  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   Heart murmur    Hypertension    Hypothyroidism    currently not on meds   Neuropathy    Osteopenia    Personal history of chemotherapy 2010   right breast ca   Personal history of radiation therapy 2010   right breast ca   Port-A-Cath in place       Past Surgical History:  Procedure Laterality Date   aortic valve stenosis  08/11/2020   BACK SURGERY     BREAST EXCISIONAL BIOPSY Right 03/2009   radiation and chemo +   BREAST EXCISIONAL BIOPSY Bilateral    NEG   BREAST SURGERY     CATARACT EXTRACTION W/ INTRAOCULAR LENS  IMPLANT, BILATERAL     CHOLECYSTECTOMY     COLON SURGERY Right 10/2019   Robotic assisted right hemicolectomy.   COLONOSCOPY WITH PROPOFOL N/A 10/19/2019   Procedure: COLONOSCOPY WITH PROPOFOL-attempted, unable to perform poor prep;  Surgeon: Lucilla Lame, MD;  Location: [place];  Service: Endoscopy;  Laterality: N/A;  priority 3   COLONOSCOPY WITH PROPOFOL N/A 10/20/2019   Procedure: COLONOSCOPY WITH PROPOFOL;  Surgeon: Lucilla Lame, MD;  Location[facility]  ENDOSCOPY;  Service: Endoscopy;  Laterality: N/A;   COLONOSCOPY WITH PROPOFOL N/A 11/16/2021   Procedure: COLONOSCOPY WITH PROPOFOL;  Surgeon: Lucilla Lame, MD;  Location: [facility] ENDOSCOPY;  Service: Endoscopy;  Laterality: N/A;   ESOPHAGEAL DILATION  10/19/2019   Procedure: ESOPHAGEAL DILATION;  Surgeon: Lucilla Lame, MD;  Location: [place];  Service: Endoscopy;;   ESOPHAGOGASTRODUODENOSCOPY (EGD) WITH PROPOFOL N/A 10/19/2019   Procedure: ESOPHAGOGASTRODUODENOSCOPY (EGD) WITH PROPOFOL;  Surgeon: Lucilla Lame, MD;  Location: [place];  Service: Endoscopy;  Laterality: N/A;   FLEXIBLE SIGMOIDOSCOPY N/A 03/20/2022   Procedure: FLEXIBLE SIGMOIDOSCOPY;  Surgeon: Lucilla Lame, MD;  Location: [facility] ENDOSCOPY;  Service: Endoscopy;  Laterality: N/A;   PORTACATH PLACEMENT Right 11/23/2019   Procedure: INSERTION PORT-A-CATH;  Surgeon: Ronny Bacon, MD;  Location: [facility] ORS;  Service: General;  Laterality: Right;    No Known Allergies  Current Outpatient Medications  Medication Sig Dispense Refill   amLODipine (NORVASC) 5 MG tablet Take 1 tablet (5 mg total) by mouth daily. 90 tablet 1   aspirin EC 81 MG tablet Take 81 mg by mouth daily.     benazepril (LOTENSIN) 20 MG tablet Take 1 tablet (20 mg total) by mouth daily. 90 tablet 1   ibuprofen (ADVIL) 200 MG tablet Take 200 mg by mouth every 6 (six)  hours as needed.     methimazole (TAPAZOLE) 5 MG tablet Take 10 mg by mouth daily.     metoprolol succinate (TOPROL-XL) 50 MG 24 hr tablet Take 1 tablet (50 mg total) by mouth daily. Take with or immediately following a meal. 90 tablet 1   Multiple Vitamin (MULTI-VITAMINS) TABS Take 1 tablet by mouth daily.      Probiotic Product (PROBIOTIC ADVANCED PO) Take 1-2 capsules by mouth daily. When taking antibiotics pt will take 2 capsules instead of 1, normally just takes 1 per day otherwise     silver sulfADIAZINE (SILVADENE) 1 % cream Apply 1 application topically daily. 50 g 0   Sodium  Hyaluronate, oral, (HYALURONIC ACID PO) Take 1 tablet by mouth daily.     vitamin B-12 (CYANOCOBALAMIN) 1000 MCG tablet Take 1,000 mcg by mouth daily.     No current facility-administered medications for this visit.   Facility-Administered Medications Ordered in Other Visits  Medication Dose Route Frequency Provider Last Rate Last Admin   sodium chloride flush (NS) 0.9 % injection 10 mL  10 mL Intravenous PRN Lloyd Huger, MD   10 mL at 03/07/20 0836   sodium chloride flush (NS) 0.9 % injection 10 mL  10 mL Intravenous PRN Lloyd Huger, MD   10 mL at 06/21/20 1259    Family History Family History  Problem Relation Age of Onset   Cancer Mother    Heart disease Father    Breast cancer Paternal Aunt 69      Social History Social History   Tobacco Use   Smoking status: Never    Passive exposure: Never   Smokeless tobacco: Never  Vaping Use   Vaping Use: Never used  Substance Use Topics   Alcohol use: Yes    Alcohol/week: 2.0 standard drinks of alcohol    Types: 2 Standard drinks or equivalent per week    Comment: only on weeks not having treatment   Drug use: No        Review of Systems  Constitutional:  Negative for chills, fever and weight loss.  HENT: Negative.    Eyes: Negative.   Respiratory: Negative.    Cardiovascular: Negative.   Gastrointestinal:  Positive for blood in stool.  Genitourinary: Negative.   Skin: Negative.   Neurological: Negative.   Psychiatric/Behavioral: Negative.        Physical Exam Blood pressure (!) 140/80, pulse 95, temperature 98.9 F (37.2 C), height '5\' 3"'$  (1.6 m), weight 185 lb (83.9 kg), SpO2 98 %. Last Weight  Most recent update: 03/29/2022 10:10 AM    Weight  83.9 kg (185 lb)             CONSTITUTIONAL: Well developed, and nourished, appropriately responsive and aware without distress.   EYES: Sclera non-icteric.   EARS, NOSE, MOUTH AND THROAT:  The oropharynx is clear. Oral mucosa is pink and moist.    Hearing is intact to voice.  NECK: Trachea is midline, and there is no jugular venous distension.  LYMPH NODES:  Lymph nodes in the neck are not enlarged. RESPIRATORY:  Lungs are clear, and breath sounds are equal bilaterally. Normal respiratory effort without pathologic use of accessory muscles. CARDIOVASCULAR: Heart is regular in rate and rhythm. GI: The abdomen is soft, nontender, and nondistended. There were no palpable masses. I did not appreciate hepatosplenomegaly. There were normal bowel sounds. GU: DRE confirms a right-sided mobile hemorrhoid/polyp.  A tiny internal tag noted at the opposite quadrant left posterior.  Otherwise no other distal rectal masses. MUSCULOSKELETAL:  Symmetrical muscle tone appreciated in all four extremities.    SKIN: Skin turgor is normal. No pathologic skin lesions appreciated.  NEUROLOGIC:  Motor and sensation appear grossly normal.  Cranial nerves are grossly without defect. PSYCH:  Alert and oriented to person, place and time. Affect is appropriate for situation.  Data Reviewed I have personally reviewed what is currently available of the patient's imaging, recent labs and medical records.   Labs:     Latest Ref Rng & Units 02/20/2022   10:24 AM 11/03/2021    9:27 AM 08/16/2021   10:03 AM  CBC  WBC 4.0 - 10.5 K/uL 6.3  5.8  6.4   Hemoglobin 12.0 - 15.0 g/dL 14.5  15.0  14.2   Hematocrit 36.0 - 46.0 % 41.0  43.6  40.8   Platelets 150 - 400 K/uL 151  165  161       Latest Ref Rng & Units 02/12/2022   10:10 AM 11/03/2021    9:27 AM 06/05/2021    2:15 PM  CMP  Glucose 70 - 99 mg/dL  114  103   BUN 8 - 27 mg/dL  8  13   Creatinine 0.44 - 1.00 mg/dL 0.50  0.48  0.39   Sodium 134 - 144 mmol/L  142  140   Potassium 3.5 - 5.2 mmol/L  3.9  3.6   Chloride 96 - 106 mmol/L  101  103   CO2 20 - 29 mmol/L  24  22   Calcium 8.7 - 10.3 mg/dL  10.1  9.5   Total Protein 6.0 - 8.5 g/dL  6.9    Total Bilirubin 0.0 - 1.2 mg/dL  0.8    Alkaline Phos 44 - 121 IU/L   103    AST 0 - 40 IU/L  19    ALT 0 - 32 IU/L  15     SURGICAL PATHOLOGY CASE: [id number] PATIENT: Maria Davies Surgical Pathology Report  Specimen Submitted: A. Rectum polyp; cbx  Clinical History: History of colon polyps Z86.010.  Rectal polyp  DIAGNOSIS: A.  RECTUM, POLYP; BIOPSIES: - SESSILE SERRATED POLYP. - FOCAL SUPERIMPOSED MUCOSAL PROLAPSE TYPE CHANGE. - NO EVIDENCE OF HIGH-GRADE DYSPLASIA OR MALIGNANCY.  GROSS DESCRIPTION: A. Labeled: cbx rectal polyp Received: Formalin Collection time: 7:42 AM on 03/20/2022 Placed into formalin time: 7:42 AM on 03/20/2022 Tissue fragment(s): Multiple Size: Aggregate, 0.9 x 0.5 x 0.2 cm Description: Tan-pink soft tissue fragments Entirely submitted in 1 cassette.  RB 03/20/2022  Final Diagnosis performed by Theodora Blow, MD.   Electronically signed 03/21/2022 1:16:34PM The electronic signature indicates that the named Attending Pathologist has evaluated the specimen Technical component performed at [facility], [address] [place], [place] [address] Lab: [phone] Dir: Rush Farmer, MD, MMM  Professional component performed at [facility], [facility], [place], [place], [place] [address] Lab: [phone] Dir: Kathi Simpers, MD    Imaging:  Within last 24 hrs: No results found.  Assessment    Distal rectal polyp, serrated seemingly adjacent to prolapsing/mobile hemorrhoid. 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   Goals of care, counseling/discussion 11/19/2019   Status post laparoscopic colectomy 11/17/2019   Colon cancer ([place]) 10/28/2019   Iron deficiency anemia secondary to blood loss (chronic)    Polyp of sigmoid colon    Benign neoplasm of cecum    Intestines neoplasm    Stricture and stenosis of esophagus    Advanced care planning/counseling discussion 07/22/2017   Hypercholesteremia 07/03/2016   Osteoporosis 07/03/2016   Carcinoma of right breast ([place]) 06/19/2016   Aortic stenosis, mild 05/02/2015   Hypertension 05/02/2015   Hyperthyroidism 05/02/2015   Breast cancer in female [facility]) 05/02/2015    Plan    Transanal excision of rectal polyp..  Risks of anesthesia, prone positioning, potential anal sphincteric dysfunction, bleeding, infection, recurrence or need for additional procedures based on pathology.  These were all discussed with the patient.  I believe she understands these are not all inclusive.  Questions answered.  No guarantees were expressed or implied.  Face-to-face time spent with the patient and accompanying care providers(if present) was 30 minutes, with more than 50% of the time spent counseling, educating, and coordinating care of the patient.    These notes generated with voice recognition software. I apologize for typographical errors.  Ronny Bacon M.D., FACS 03/29/2022, 10:57 AM

## 2022-03-29 ENCOUNTER — Telehealth: Payer: Self-pay | Admitting: Surgery

## 2022-03-29 ENCOUNTER — Ambulatory Visit: Payer: Self-pay | Admitting: Surgery

## 2022-03-29 ENCOUNTER — Ambulatory Visit (INDEPENDENT_AMBULATORY_CARE_PROVIDER_SITE_OTHER): Payer: Medicare Other | Admitting: Surgery

## 2022-03-29 ENCOUNTER — Encounter: Payer: Self-pay | Admitting: Surgery

## 2022-03-29 VITALS — BP 140/80 | HR 95 | Temp 98.9°F | Ht 63.0 in | Wt 185.0 lb

## 2022-03-29 DIAGNOSIS — K621 Rectal polyp: Secondary | ICD-10-CM

## 2022-03-29 NOTE — Patient Instructions (Signed)
You have requested to have Hemorrhoid surgery today. This will be scheduled at [facility] with Dr Christian Mate.  Please review the information below and your [facility] Information. Our surgery scheduler will call you to review surgery date and to go over information.  You will be required to do 2 enemas prior to your surgery. The first will be the night prior and the second will be done the morning of surgery.  Constipation is going to be your biggest obstacle following surgery. Use all stool softeners and laxatives as prescribed after your surgery and be sure to drink 72 ounces of water or more every day. This will help to avoid constipation. If you do all of this and you are still having difficulty, please call our office for further instructions as soon as you begin to have difficulty with bowel movements.  You may want to buy a disposable Sitz bath prior to surgery to aid in pain and cleanliness after surgery. The information on how to do use a disposable sitz bath or using a bath tub are below.  You will be out of work 1-2 weeks depending on how your healing goes. If you have FMLA/Disability paperwork that needs filled out you may drop this off at the office or fax it to [phone].   Hemorrhoid Surgery After Care Refer to this sheet in the next few weeks. These instructions provide you with information about caring for yourself after your procedure. Your health care provider may also give you more specific instructions. Your treatment has been planned according to current medical practices, but problems sometimes occur. Call your health care provider if you have any problems or questions after your procedure. What can I expect after the procedure? After the procedure, it is common to have: Rectal pain. Pain when you are having a bowel movement. Slight rectal bleeding.  Follow these instructions at home: Medicines Take over-the-counter and prescription medicines only as told by your health  care provider. Do not drive or operate heavy machinery while taking prescription pain medicine. Use a stool softener or a bulk laxative as told by your health care provider. Activity Rest at home. Return to your normal activities as told by your health care provider. Do not lift anything that is heavier than 10 lb (4.5 kg). Do not sit for long periods of time. Take a walk every day or as told by your health care provider. Do not strain to have a bowel movement. Do not spend a long time sitting on the toilet. Eating and drinking Eat foods that contain fiber, such as whole grains, beans, nuts, fruits, and vegetables. Drink enough fluid to keep your urine clear or pale yellow. General instructions Sit in a warm bath 2-3 times per day to relieve soreness or itching. Keep all follow-up visits as told by your health care provider. This is important. Contact a health care provider if: Your pain medicine is not helping. You have a fever or chills. You become constipated. You have trouble passing urine. Get help right away if: You have very bad rectal pain. You have heavy bleeding from your rectum. This information is not intended to replace advice given to you by your health care provider. Make sure you discuss any questions you have with your health care provider. Document Released: 08/25/2003 Document Revised: 11/10/2015 Document Reviewed: 08/30/2014 Elsevier Interactive Patient Education  2018 [employer] A disposable sitz bath is a plastic basin that fits over the toilet. A bag is hung  above the toilet, and the bag is connected to a tube that opens into the basin. The bag is filled with warm water that flows into the basin through the tube. A sitz bath can be used to help relieve symptoms, clean, and promote healing in the genital and anal areas, as well as in the lower abdomen and buttocks. What are the risks? Sitz baths are generally very safe. It is possible for  the skin between the genitals and the anus (perineum) to become infected, but this is rare. You can avoid this by cleaning your sitz bath supplies thoroughly. How to use a disposable sitz bath Close the clamp on the tube. Make sure the clamp is closed tightly to prevent leakage. Fill the sitz bath basin and the plastic bag with warm water. The water should be warm enough to be comfortable, but not hot. Raise the toilet seat and place the filled basin on the toilet. Make sure the overflow opening is facing toward the back of the toilet. If you prefer, you may place the empty basin on the toilet first, and then use the plastic bag to fill the basin with warm water. Hang the filled plastic bag overhead on a hook or towel rack close to the toilet. The bag should be higher than the toilet so that the water will flow down through the tube. Attach the tube to the opening on the basin. Make sure that the tube is attached to the basin tightly to prevent leakage. Sit on the basin and release the clamp. This will allow warm water to flow into the basin and flush the area around your genitals and anus. Remain sitting on the basin for about 15-20 minutes, or as long as told by your health care provider. Stand up and gently pat your skin dry. If directed, apply clean bandages (dressings) to the affected area as told by your health care provider. Carefully remove the basin from the toilet seat and tip the basin into the toilet to empty any remaining water. Empty any remaining water from the plastic bag into the toilet. Then, flush the toilet. Wash the basin with warm water and soap. Let the basin air dry in the sink. You should also let the plastic bag and the tubing air dry. Store the basin, tubing, and plastic bag in a clean, dry area. Wash your hands with soap and water. If soap and water are not available, use hand sanitizer. Contact a health care provider if: You have symptoms that get worse instead of  better. You develop new skin irritation, redness, or swelling around your genitals or anus. This information is not intended to replace advice given to you by your health care provider. Make sure you discuss any questions you have with your health care provider. Document Released: 12/04/2011 Document Revised: 11/10/2015 Document Reviewed: 04/24/2015 Elsevier Interactive Patient Education  2018 [employer].   How to Take a [employer] A sitz bath is a warm water bath that is taken while you are sitting down. The water should only come up to your hips and should cover your buttocks. Your health care provider may recommend a sitz bath to help you: Clean the lower part of your body, including your genital area. With itching. With pain. With sore muscles or muscles that tighten or spasm.  How to take a sitz bath Take 3-4 sitz baths per day or as told by your health care provider. Partially fill a bathtub with warm water. You will only  need the water to be deep enough to cover your hips and buttocks when you are sitting in it. If your health care provider told you to put medicine in the water, follow the directions exactly. Sit in the water and open the tub drain a little. Turn on the warm water again to keep the tub at the correct level. Keep the water running constantly. Soak in the water for 15-20 minutes or as told by your health care provider. After the sitz bath, pat the affected area dry first. Do not rub it. Be careful when you stand up after the sitz bath because you may feel dizzy.  Contact a health care provider if: Your symptoms get worse. Do not continue with sitz baths if your symptoms get worse. You have new symptoms. Do not continue with sitz baths until you talk with your health care provider. This information is not intended to replace advice given to you by your health care provider. Make sure you discuss any questions you have with your health care provider. Document  Released: 02/25/2004 Document Revised: 11/02/2015 Document Reviewed: 06/02/2014 Elsevier Interactive Patient Education  [employer].

## 2022-03-29 NOTE — Telephone Encounter (Signed)
Patient has been advised of Pre-Admission date/time, and Surgery date at [facility].  Surgery Date: 04/11/22 Preadmission Testing Date: 04/04/22 (phone 8a-1p)  Patient has been made aware to call [phone], between 1-3:00pm the day before surgery, to find out what time to arrive for surgery.

## 2022-04-04 ENCOUNTER — Other Ambulatory Visit: Payer: Self-pay

## 2022-04-04 ENCOUNTER — Encounter
Admission: RE | Admit: 2022-04-04 | Discharge: 2022-04-04 | Disposition: A | Discharge status: Home / Self Care | Payer: Medicare Other | Source: Ambulatory Visit | Attending: Surgery | Admitting: Surgery

## 2022-04-04 DIAGNOSIS — Z01812 Encounter for preprocedural laboratory examination: Secondary | ICD-10-CM

## 2022-04-04 HISTORY — DX: Other specified postprocedural states: Z98.890

## 2022-04-04 HISTORY — DX: Other specified postprocedural states: R11.2

## 2022-04-04 HISTORY — DX: Atherosclerotic heart disease of native coronary artery without angina pectoris: I25.10

## 2022-04-04 HISTORY — DX: Nausea with vomiting, unspecified: R11.2

## 2022-04-04 NOTE — Patient Instructions (Addendum)
Your procedure is scheduled on: 04/11/22 - Wednesday Report to the Registration Desk on the 1st floor of the [place]. To find out your arrival time, please call [phone] between 1PM - 3PM on: 04/10/22 - Tuesday If your arrival time is 6:00 am, do not arrive prior to that time as the [place] entrance doors do not open until 6:00 am.  REMEMBER: Instructions that are not followed completely may result in serious medical risk, up to and including death; or upon the discretion of your surgeon and anesthesiologist your surgery may need to be rescheduled.  Do not eat food or drink any fluids after midnight the night before surgery.  No gum chewing, lozengers or hard candies.   TAKE THESE MEDICATIONS THE MORNING OF SURGERY WITH A SIP OF WATER:  - amLODipine (NORVASC)  - methimazole (TAPAZOLE)  - metoprolol succinate (TOPROL-XL)    One week prior to surgery: Stop Anti-inflammatories (NSAIDS) such as Advil, Aleve, Ibuprofen, Motrin, Naproxen, Naprosyn and Aspirin based products such as Excedrin, Goodys Powder, BC Powder.  Stop ANY OVER THE COUNTER supplements until after surgery. Sodium Hyaluronate,    You may take Tylenol if needed for pain up until the day of surgery.  No Alcohol for 24 hours before or after surgery.  No Smoking including e-cigarettes for 24 hours prior to surgery.  No chewable tobacco products for at least 6 hours prior to surgery.  No nicotine patches on the day of surgery.  Do not use any "recreational" drugs for at least a week prior to your surgery.  Please be advised that the combination of cocaine and anesthesia may have negative outcomes, up to and including death. If you test positive for cocaine, your surgery will be cancelled.  On the morning of surgery brush your teeth with toothpaste and water, you may rinse your mouth with mouthwash if you wish. Do not swallow any toothpaste or mouthwash.  Do not wear jewelry, make-up, hairpins, clips or  nail polish.  Do not wear lotions, powders, or perfumes.   Do not shave body from the neck down 48 hours prior to surgery just in case you cut yourself which could leave a site for infection.  Also, freshly shaved skin may become irritated if using the CHG soap.  Contact lenses, hearing aids and dentures may not be worn into surgery.  Do not bring valuables to the hospital. [facility] is not responsible for any missing/lost belongings or valuables.   Fleets enema or bowel prep as directed.  Notify your doctor if there is any change in your medical condition (cold, fever, infection).  Wear comfortable clothing (specific to your surgery type) to the hospital.  After surgery, you can help prevent lung complications by doing breathing exercises.  Take deep breaths and cough every 1-2 hours. Your doctor may order a device called an Incentive Spirometer to help you take deep breaths. When coughing or sneezing, hold a pillow firmly against your incision with both hands. This is called "splinting." Doing this helps protect your incision. It also decreases belly discomfort.  If you are being admitted to the hospital overnight, leave your suitcase in the car. After surgery it may be brought to your room.  If you are being discharged the day of surgery, you will not be allowed to drive home. You will need a responsible adult (18 years or older) to drive you home and stay with you that night.   If you are taking public transportation, you will need  to have a responsible adult (18 years or older) with you. Please confirm with your physician that it is acceptable to use public transportation.   Please call the [facility] at [phone] if you have any questions about these instructions.  Surgery Visitation Policy:  Patients undergoing a surgery or procedure may have two family members or support persons with them as long as the person is not COVID-19 positive or experiencing  its symptoms.   Inpatient Visitation:    Visiting hours are 7 a.m. to 8 p.m. Up to four visitors are allowed at one time in a patient room, including children. The visitors may rotate out with other people during the day. One designated support person (adult) may remain overnight.

## 2022-04-05 ENCOUNTER — Encounter: Payer: Self-pay | Admitting: Surgery

## 2022-04-05 NOTE — Progress Notes (Signed)
Perioperative Services  Pre-Admission/Anesthesia Testing Clinical Review  Date: 04/11/22  Patient Demographics:  Name: Maria Davies DOB:   1940-08-10 MRN:   [id number]  Planned Surgical Procedure(s):    Case: [id number] Date/Time: 04/11/22 1310   Procedure: RECTAL TUMOR EXCISION   Anesthesia type: General   Pre-op diagnosis: rectal polyp   Location: [facility] OR ROOM 04 / [place] [facility]S FOR ANESTHESIA GROUP   Surgeons: Ronny Bacon, MD   NOTE: Available PAT nursing documentation and vital signs have been reviewed. Clinical nursing staff has updated patient's PMH/PSHx, current medication list, and drug allergies/intolerances to ensure comprehensive history available to assist in medical decision making as it pertains to the aforementioned surgical procedure and anticipated anesthetic course. Extensive review of available clinical information performed. [place] PMH and PSHx updated with any diagnoses/procedures that  may have been inadvertently omitted during her intake with the pre-admission testing department's nursing staff.  Clinical Discussion:  Maria Davies is a 81 y.o. female who is submitted for pre-surgical anesthesia review and clearance prior to her undergoing the above procedure. Patient has never been a smoker. Pertinent PMH includes: CAD, severe aortic valve stenosis (s/p TAVR) CHB (s/p PPM placement), diastolic dysfunction, aortic atherosclerosis, cardiac murmur, HTN, hyperthyroidism, anemia, RIGHT breast cancer, colon cancer, Guillain-Barr syndrome, OA, multilevel cervical spondylosis, cervical spinal DDD with resulting stenosis.  Patient is followed by cardiology Ubaldo Glassing, MD). She was last seen in the cardiology clinic on 02/13/2022; notes reviewed.  At the time of his clinic visit, patient doing fairly well overall from a cardiovascular perspective.  She denied any episodes of chest pain, shortness of breath, PND, orthopnea, significant peripheral edema, palpitations,  vertiginous symptoms, or presyncope/syncope.  Patient has a past medical history significant for cardiovascular diagnoses.  Patient found to have aortic valve stenosis on TTE performed on 08/17/2016.  At that time, study revealed normal left ventricular systolic function with an EF of 55%.  There was mild aortic valve stenosis with a mean pressure gradient of 29.5 mmHg; AVA (VTI) = 1.3 cm.  Since diagnosis, stenosis has been monitored EF serial noninvasive studies as follows:  TTE performed on 08/25/2018 revealed normal left trickle systolic function with an EF of >55%.  There was moderate concentric LVH. Left ventricular diastolic Doppler parameters consistent with abnormal relaxation (G1DD).  There was trivial to mild pan valvular regurgitation noted.  Degree of aortic valve stenosis had progressed to moderate with a mean pressure gradient of 42.3 mmHg; AVA (VTI) = 0.82 cm.  Repeat TTE performed on 05/30/2020 revealed a normal left trickle systolic function with an EF of >55%.  Left atrium moderately dilated.  There was trivial mitral and tricuspid valve regurgitation.  Degree of stenosis had progressed to severe with a mean pressure gradient of 47.2 mmHg; AVA (VTI) = 0.59 cm.  Diagnostic RIGHT/LEFT heart catheterization was performed on 08/11/2020.  There was no evidence of obstructive coronary artery disease.  Wedge pressure was mildly elevated.  Cardiac output and index were elevated in the context of anemia (hemoglobin 9 g/dL).  There was no evidence of intra-atrial shunting noted.  Patient ultimately underwent TAVR procedure via a RIGHT percutaneous femoral approach at [facility] on 08/31/2020.  A 23 mm Edwards SAPIEN 3 ultra bioprosthetic valve was placed.  Procedure was complicated by intra/postoperative complete heart block requiring placement of a transvenous pacemaker.  Postoperatively, when unable to discontinue transvenous pacing, electrophysiology was consulted and the  decision was made to place a permanent implanted cardiac device.  Medtronic device, with LEFT bundle lead, was placed on 09/03/2020.  Most recent TTE was performed on 12/11/2021 revealing a left ventricular systolic function with moderate concentric LVH; LVEF >55%. Left ventricular diastolic Doppler parameters consistent with abnormal relaxation (G1DD).  There was mild mitral annular calcification.  There was mild biatrial and mild right ventricular enlargement.  Trivial to mild mitral, tricuspid, and pulmonary valve regurgitation noted.  Bioprosthetic aortic valve well-seated and noted to be functioning properly; mean transit bioprosthetic valve gradient was 30.4 mmHg; AVA (VTI) = 1.3 cm.  Blood pressure reasonably controlled at 130/70 mmHg on currently prescribed CCB (amlodipine), ACEi (benazepril), and beta-blocker (metoprolol succinate) therapies.  Patient is not currently taking any type of lipid-lowering therapies for ASCVD prevention.  Patient is not diabetic. Patient does not have an OSAH diagnosis. Functional capacity, as defined by DASI, is documented as being >/= 4 METS.  No changes were made to her medication regimen.  Patient follow-up with outpatient cardiology in 6 months or sooner if needed.  Maria Davies is scheduled for a RECTAL TUMOR EXCISION on 04/11/2022 with Dr. Ronny Bacon, MD.  Given patient's past medical history significant for cardiovascular diagnoses, presurgical cardiac clearance was sought by the PAT team. Per cardiology, "this patient is optimized for surgery and may proceed with the planned procedural course with a LOW risk of significant perioperative cardiovascular complications".  In review of her medication reconciliation, it is noted that patient is currently on prescribed daily antiplatelet therapy. She has been instructed on recommendations for holding her daily low-dose ASA for 5 days prior to her procedure with plans to restart as soon as postoperative bleeding  risk felt to be minimized by her attending surgeon. The patient has been instructed that her last dose of her anticoagulant will be on 04/05/2022.  Patient reports previous perioperative complications with anesthesia in the past. Patient has a PMH (+) for PONV. Symptoms and history of PONV will be discussed with patient by anesthesia team on the day of her procedure. Interventions will be ordered as deemed necessary based on patient's individual care needs as determined by anesthesiologist. In review of the available records, it is noted that patient underwent a general anesthetic course here at [facility] (ASA III) in 03/2022 without documented complications.      03/29/2022   10:08 AM 03/20/2022    8:53 AM 03/20/2022    8:43 AM  Vitals with BMI  Height _0     Weight 185 lbs    BMI 59.16    Systolic 384 665 993  Diastolic 80 67 75  Pulse 95  77    Providers/Specialists:   NOTE: Primary physician provider listed below. Patient may have been seen by APP or partner within same practice.   PROVIDER ROLE / SPECIALTY LAST Katy Apo, MD General Surgery (Surgeon) 03/29/2022  Valerie Roys, DO Primary Care Provider 11/03/2021  Bartholome Bill, MD Cardiology 02/13/2022  Delight Hoh, MD Medical oncology 02/20/2022  Lavone Orn, MD Endocrinology 01/15/2022   Allergies:  Tape  Current Home Medications:    acetaminophen (TYLENOL) 500 MG tablet   [START ON 04/12/2022] acetaminophen (TYLENOL) tablet 1,000 mg   bupivacaine liposome (EXPAREL) 1.3 % injection 266 mg   celecoxib (CELEBREX) 200 MG capsule   [START ON 04/12/2022] celecoxib (CELEBREX) capsule 200 mg   chlorhexidine (PERIDEX) 0.12 % solution 15 mL   Or   Oral care mouth rinse   chlorhexidine (PERIDEX) 0.12 % solution  Chlorhexidine Gluconate Cloth 2 % PADS 6 each   And   Chlorhexidine Gluconate Cloth 2 % PADS 6 each   famotidine (PEPCID) 20 MG tablet   famotidine  (PEPCID) tablet 20 mg   gabapentin (NEURONTIN) 300 MG capsule   [START ON 04/12/2022] gabapentin (NEURONTIN) capsule 300 mg   lactated ringers infusion   sodium phosphate (FLEET) 7-19 GM/118ML enema 1 enema   [START ON 04/12/2022] sodium phosphate (FLEET) 7-19 GM/118ML enema 1 enema    sodium chloride flush (NS) 0.9 % injection 10 mL   sodium chloride flush (NS) 0.9 % injection 10 mL   History:   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   a.) stage IIIb; b.) RIGHT colon mass Bx (+) for invasive moderately differentiated adenocarcinoma (pT3 pN1a)   Anemia    Aortic atherosclerosis (HCC)    Aortic stenosis    a.) TTE 08/17/2016: EF >55%, mild AS (MPG 29.5); b.) TTE 08/25/2018: EF >55%; mod AS (MPG 42.3); c.) TTE 06/17/2020: EF >55%; severe AS (MPG 47.2); d.) s/p TAVR 08/31/2020 at [facility] - 23 mm Edwards Sapien 3 Ultra valve placed via a RIGHT percutaneous femoral approach --> complicated by intra/postoperative CHB requiring TVP and ultimate PPM placement   Arthritis    Basal cell carcinoma (BCC) of back    Breast cancer, right (HCC) 2011   a.) stage Ia IMC (ER/PR +, HER2/neu -); b.) lumpectomy + XRT in 2011; c.) completed 5 years AI therapy (anastrozole) in 11/2014   Cervical spinal stenosis    Cervical spondylosis    Chemotherapy induced nausea and vomiting    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   a.) TVP placed intraoperatively during TAVR secondary to development of CHB; b.) Medtronic PPM placed 09/03/2020 for postoperative continued postoperative CHB   Coronary artery disease    a.) RHC 08/11/2020: no sig CAD   DDD (degenerative disc disease), cervical    Diastolic dysfunction    a.) TTE 08/17/2016: EF >55%, mild LVH, mild LAE, triv panvalvular regurg, G1DD; b.) TTE 08/25/2018: EF >55%, mod LVH, mild LAE, triv AR/TR/PR, mild MR, G1dd; c.) TTE 08/11/2020: EF >55%, mild LVHH, mod LAE, triv MR/TR   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   Heart  murmur    History of bilateral cataract extraction    Hypertension    Hyperthyroidism    Neuropathy    Osteopenia    PONV (postoperative nausea and vomiting)    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   a.) TVP placed intraoperatively during TAVR secondary to development of CHB; b.) Medtronic PPM placed 09/03/2020 for postoperative continued postoperative CHB   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   a.) s/p TAVR 08/31/2020 at [facility] - 23 mm Edwards Sapien 3 Ultra valve placed via a RIGHT percutaneous femoral approach --> complicated by intra/postoperative CHB requiring TVP and ultimate PPM placement   Thyroid nodule 01/25/2022   a.) CT C-spine 01/25/2022: 22 mm right thyroid nodule   Past Surgical History:  Procedure Laterality Date   BACK SURGERY     BREAST EXCISIONAL BIOPSY Right 03/2009   BREAST EXCISIONAL BIOPSY Bilateral    NEG   CATARACT EXTRACTION W/ INTRAOCULAR LENS  IMPLANT, BILATERAL     CHOLECYSTECTOMY     COLONOSCOPY WITH PROPOFOL N/A 10/19/2019   Procedure: COLONOSCOPY WITH PROPOFOL-attempted, unable to perform poor prep;  Surgeon: Midge Minium, MD;  Location: [facility];  Service: Endoscopy;  Laterality: N/A;  priority 3  COLONOSCOPY WITH PROPOFOL N/A 10/20/2019   Procedure: COLONOSCOPY WITH PROPOFOL;  Surgeon: Lucilla Lame, MD;  Location[facility];  Service: Endoscopy;  Laterality: N/A;   COLONOSCOPY WITH PROPOFOL N/A 11/16/2021   Procedure: COLONOSCOPY WITH PROPOFOL;  Surgeon: Lucilla Lame, MD;  Location: [facility] ENDOSCOPY;  Service: Endoscopy;  Laterality: N/A;   ESOPHAGEAL DILATION  10/19/2019   Procedure: ESOPHAGEAL DILATION;  Surgeon: Lucilla Lame, MD;  Location: [place];  Service: Endoscopy;;   ESOPHAGOGASTRODUODENOSCOPY (EGD) WITH PROPOFOL N/A 10/19/2019   Procedure: ESOPHAGOGASTRODUODENOSCOPY (EGD) WITH PROPOFOL;  Surgeon: Lucilla Lame, MD;  Location: [place];  Service: Endoscopy;  Laterality:  N/A;   FLEXIBLE SIGMOIDOSCOPY N/A 03/20/2022   Procedure: FLEXIBLE SIGMOIDOSCOPY;  Surgeon: Lucilla Lame, MD;  Location: [facility] ENDOSCOPY;  Service: Endoscopy;  Laterality: N/A;   MASTECTOMY, PARTIAL Right 2010   PACEMAKER INSERTION N/A 09/03/2020   Procedure: PACEMAKER WITH LEFT BUNDLE LEAD (MEDTRONIC); Location: [facility]; Surgeon: Teena Dunk, MD   [facility] REMOVAL  2021   PORTACATH PLACEMENT Right 11/23/2019   Procedure: INSERTION PORT-A-CATH;  Surgeon: Ronny Bacon, MD;  Location: [facility] ORS;  Service: General;  Laterality: Right;   RIGHT COLECTOMY Right 10/2019   Procedure: ROBOTIC ASSISTED TIGHT HEMICOLECTOMY   RIGHT/LEFT HEART CATH AND CORONARY ANGIOGRAPHY Right 08/11/2020   Procedure: RIGHT/LEFT HEART CATH AND CORONARY ANGIOGRAPHY; Location: [facility]; Surgeon: Jori Moll, MD   TRANSCATHETER AORTIC VALVE REPLACEMENT, TRANSFEMORAL Right 08/31/2020   Procedure: TRANSCATHETER AORTIC VALVE REPLACEMENT, RIGHT TRANSFEMORAL; Location: [facility]; Surgeon: Durward Mallard, MD   Family History  Problem Relation Age of Onset   Cancer Mother    Heart disease Father    Breast cancer Paternal Aunt 80   Social History   Tobacco Use   Smoking status: Never    Passive exposure: Never   Smokeless tobacco: Never  Vaping Use   Vaping Use: Never used  Substance Use Topics   Alcohol use: Yes    Alcohol/week: 2.0 standard drinks of alcohol    Types: 2 Standard drinks or equivalent per week    Comment: only on weeks not having treatment   Drug use: No    Pertinent Clinical Results:  LABS: Labs reviewed: Acceptable for surgery.  Lab Results  Component Value Date   WBC 6.3 02/20/2022   HGB 14.5 02/20/2022   HCT 41.0 02/20/2022   MCV 97.6 02/20/2022   PLT 151 02/20/2022   Lab Results  Component Value Date   NA 142 11/03/2021   K 3.9 11/03/2021   CO2 24 11/03/2021   GLUCOSE 114 (H) 11/03/2021   BUN 8 11/03/2021   CREATININE 0.50 02/12/2022   CALCIUM 10.1 11/03/2021   EGFR 96 11/03/2021    GFRNONAA >60 03/31/2021    ECG: Date: 11/21/2021 Time ECG obtained: 1122 AM Rate: 85 bpm Rhythm:  Atrial sensed ventricular paced rhythm Axis (leads I and aVF): Normal Intervals: PR 178 ms. QRS 172 ms. QTc 509 ms. ST segment and T wave changes: No evidence of acute ST segment elevation or depression Comparison: Similar to previous tracing obtained on 03/24/2021 NOTE: Tracing obtained at [facility]; unable for review. Above based on cardiologist's interpretation.    IMAGING / PROCEDURES: CT ABDOMEN PELVIS W CONTRAST performed on 02/12/2022 Stable exam with no evidence of recurrent metastatic carcinoma within the abdomen or pelvis. Colonic diverticulosis without radiographic evidence of diverticulitis Aortic atherosclerosis  TRANSTHORACIC ECHOCARDIOGRAM performed on 12/11/2021 Normal left ventricular systolic function with an EF of greater than 55% Moderate concentric LVH Left ventricular diastolic Doppler parameters consistent  with abnormal relaxation (G1DD). Moderate mitral annular calcification Mild biatrial enlargement Mild right ventricular enlargement Normal right ventricular systolic function Bioprosthetic aortic valve well-seated with normal function; mean transvalvular gradient 30.4 millimeters of mercury Trivial TR and PR Mild MR No AR No pericardial effusion  RIGHT/LEFT HEART CATHETERIZATION AND CORONARY ANGIOGRAPHY performed on 08/11/2020 Mild elevation of the wedge pressure Elevated cardiac output/index in the context of anemia (hemoglobin 9 g/dL) No cardiac shunt on full oximetry shunt run  No significant obstructive coronary artery disease    Impression and Plan:  Maria Davies has been referred for pre-anesthesia review and clearance prior to her undergoing the planned anesthetic and procedural courses. Available labs, pertinent testing, and imaging results were personally reviewed by me. This patient has been appropriately cleared by cardiology with an  overall LOW risk of significant perioperative cardiovascular complications. Completed perioperative prescription for cardiac device management documentation completed by primary cardiology team and placed on patient's chart for review by the surgical/anesthetic team on the day of her procedure. Electrophysiology indicating that procedure should not interfere with planned surgical procedure. Beyond normal perioperative cardiovascular monitoring, there are no recommendations from electrophysiology team that prompt further discussion/recommendations from industry representative.   Based on clinical review performed today (04/11/22), barring any significant acute changes in the patient's overall condition, it is anticipated that she will be able to proceed with the planned surgical intervention. Any acute changes in clinical condition may necessitate her procedure being postponed and/or cancelled. Patient will meet with anesthesia team (MD and/or CRNA) on the day of her procedure for preoperative evaluation/assessment. Questions regarding anesthetic course will be fielded at that time.   Pre-surgical instructions were reviewed with the patient during her PAT appointment and questions were fielded by PAT clinical staff. Patient was advised that if any questions or concerns arise prior to her procedure then she should return a call to PAT and/or her surgeon's office to discuss.  Honor Loh, MSN, APRN, FNP-C, CEN [facility]  Peri-operative Services Nurse Practitioner Phone: [phone] Fax: [phone] 04/11/22 12:26 PM  NOTE: This note has been prepared using Dragon dictation software. Despite my best ability to proofread, there is always the potential that unintentional transcriptional errors may still occur from this process.

## 2022-04-09 ENCOUNTER — Encounter: Payer: Self-pay | Admitting: Surgery

## 2022-04-09 IMAGING — CR DG SACRUM/COCCYX 2+V
1 series · 4 of 4 positions shown · non-contrast
Comparison: Concurrently obtained radiographs of the lumbar spine

CLINICAL DATA: Fall, sacral pain

EXAM:
SACRUM AND COCCYX - 2+ VIEW

[Series 1: dg sacrum/coccyx · 0.14mm/px · 4 of 4 slices shown]
[im 1/4]
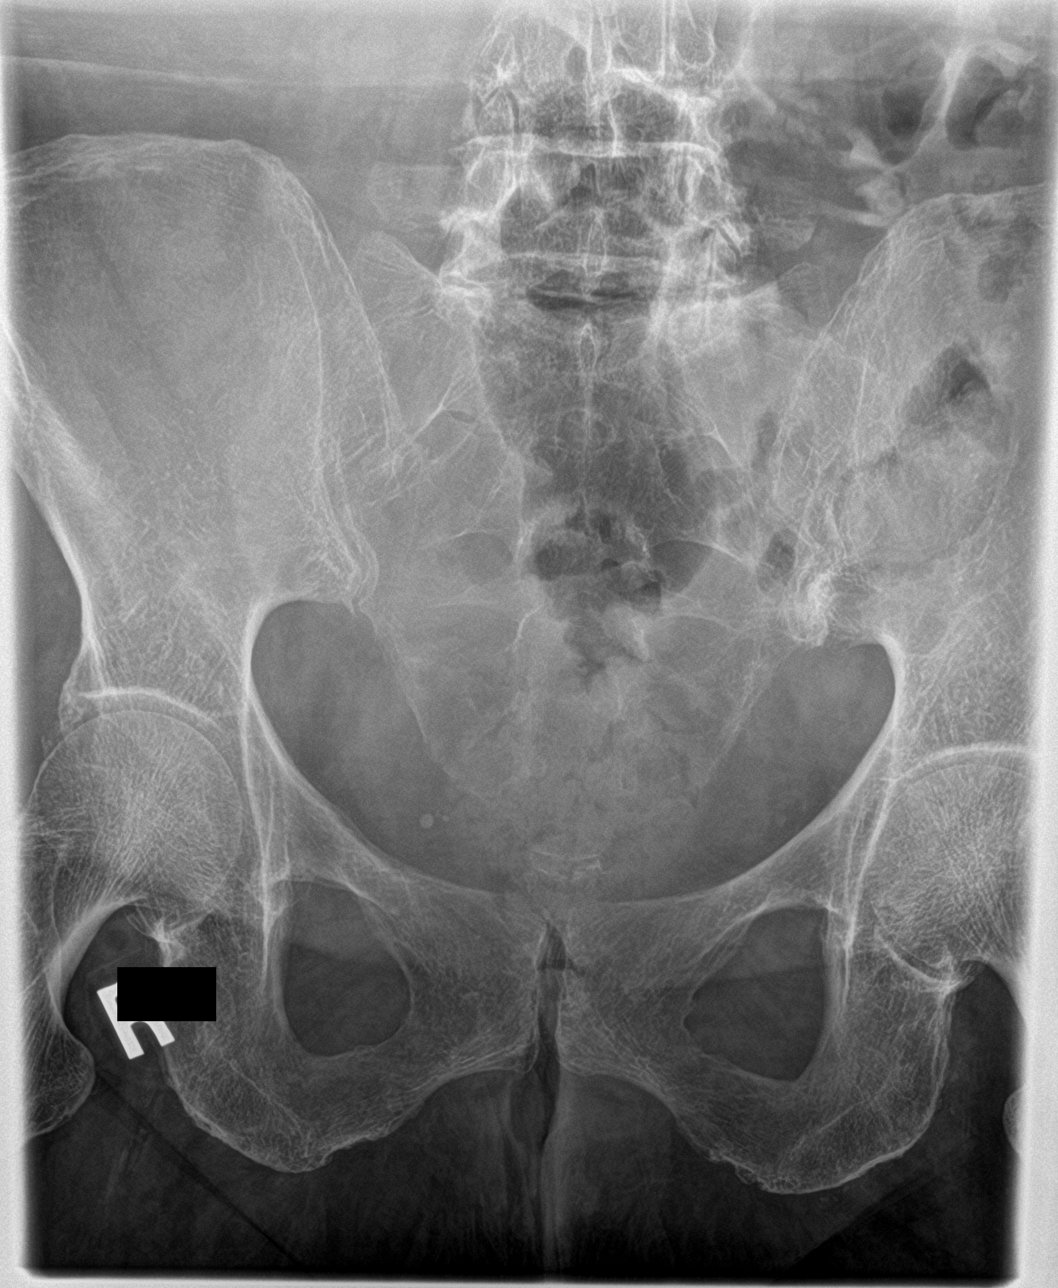
[im 2/4]
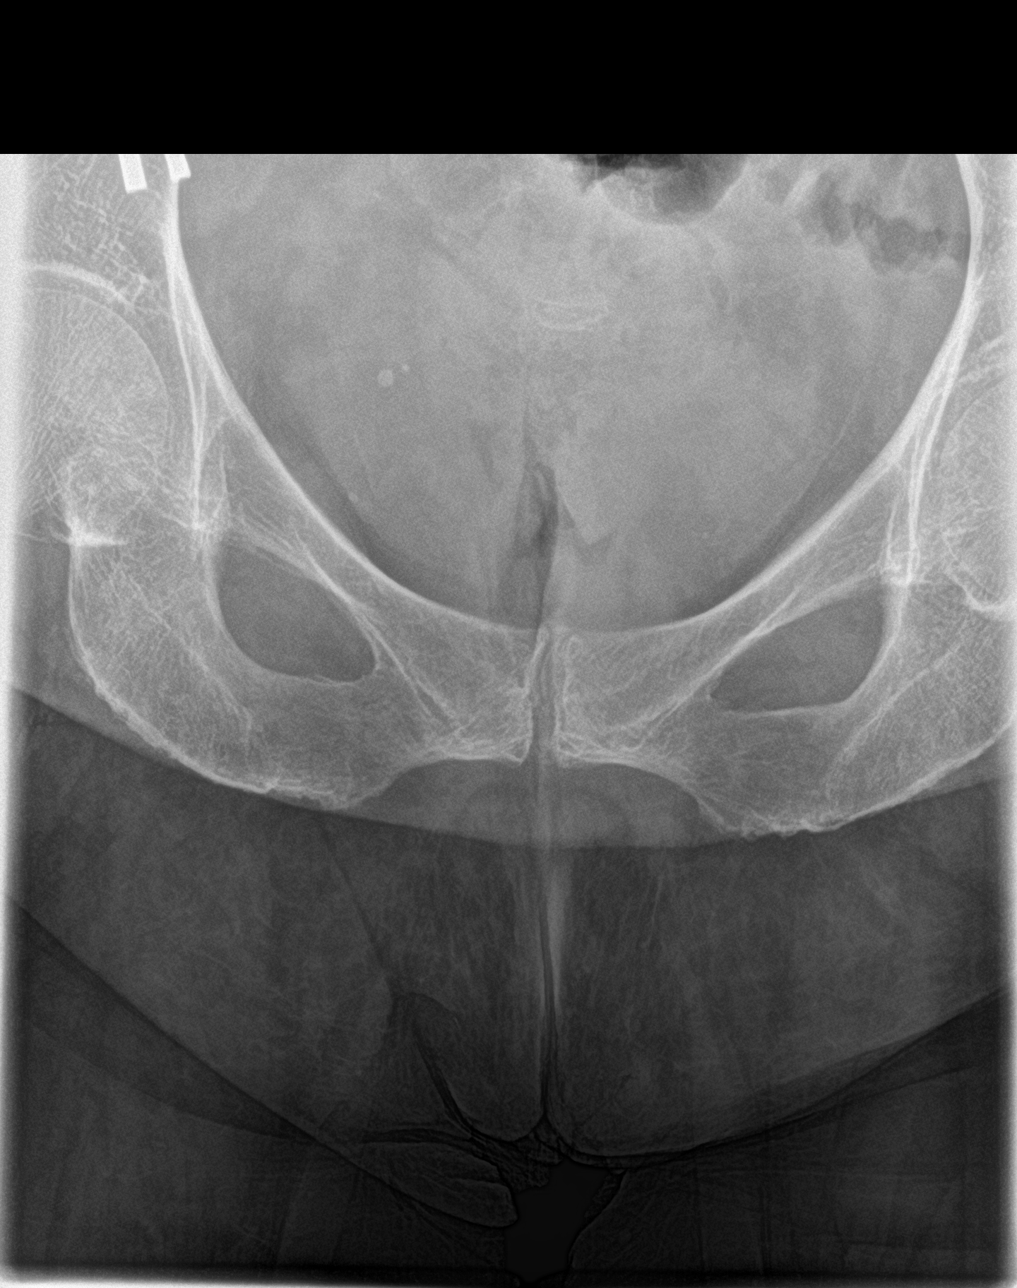
[im 3/4]
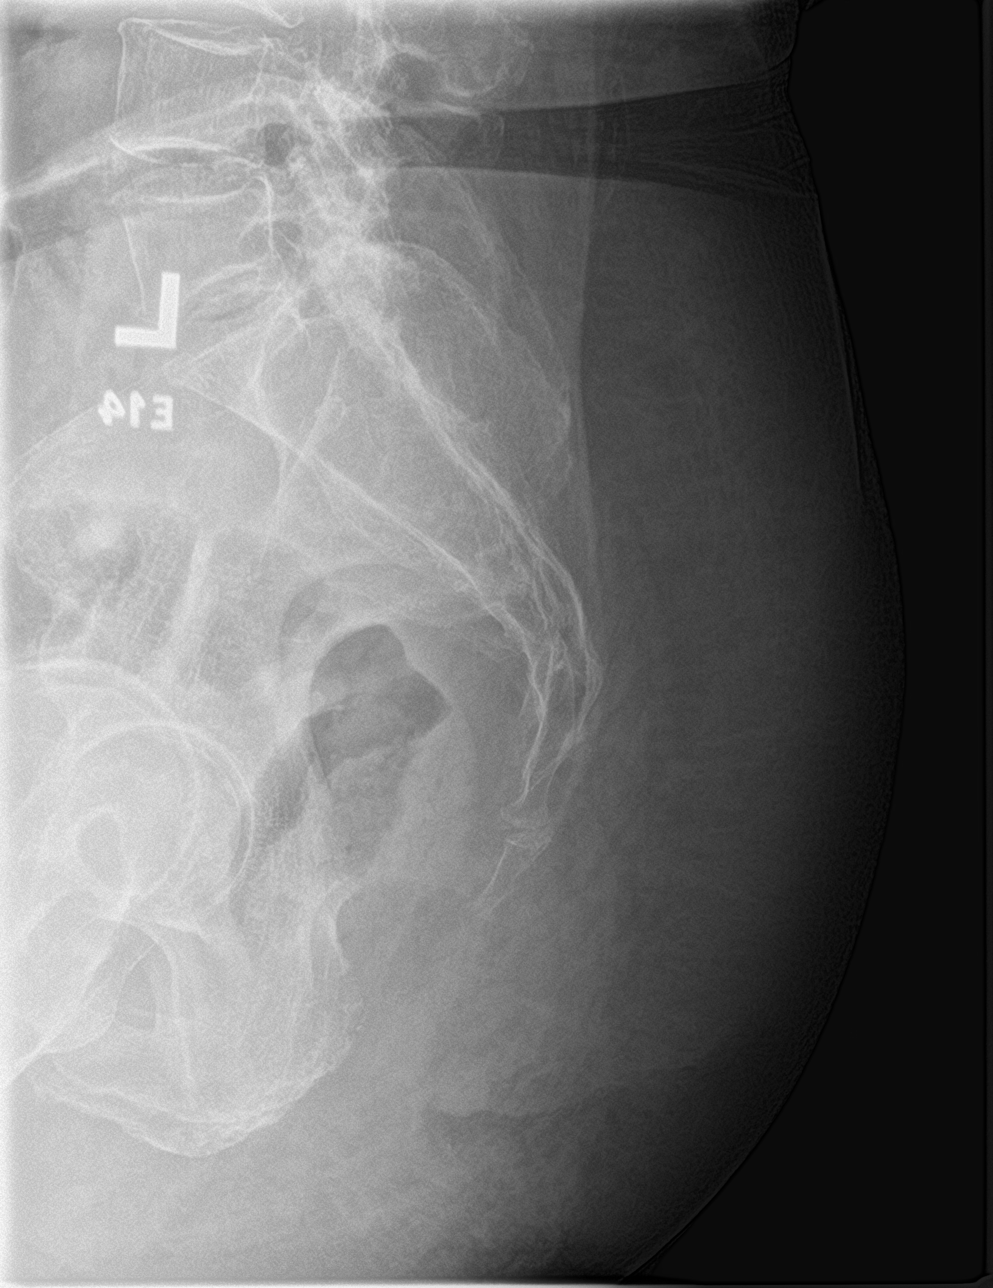
[im 4/4]
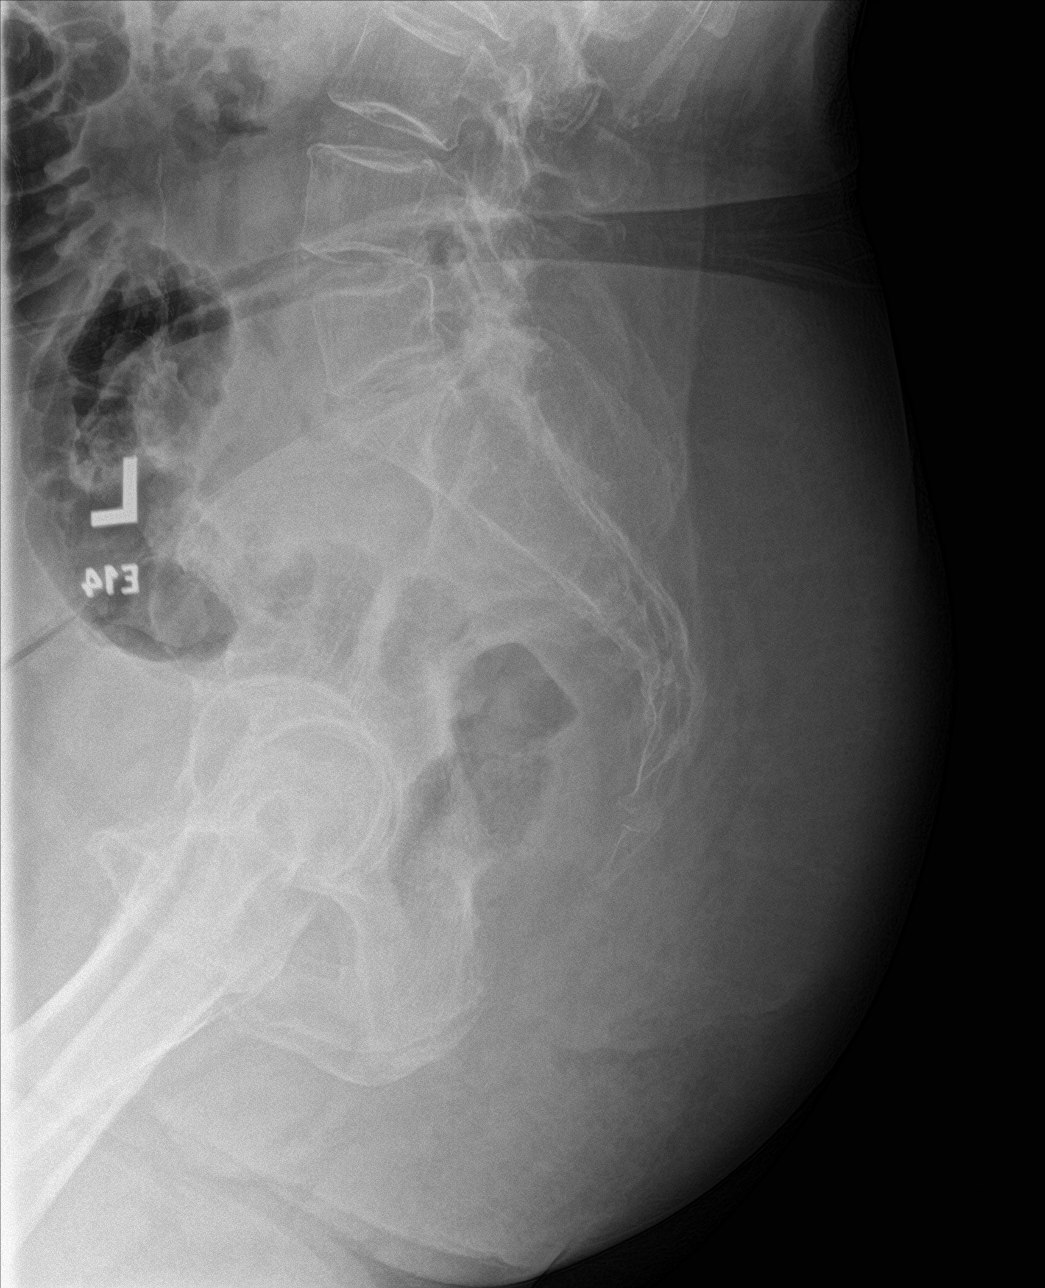

[4 of 4 positions shown; findings below may reference images not displayed]

FINDINGS: There is no evidence of fracture or other focal bone lesions.
IMPRESSION: Negative.

## 2022-04-11 ENCOUNTER — Ambulatory Visit: Payer: Medicare Other | Admitting: Urgent Care

## 2022-04-11 ENCOUNTER — Encounter
Admission: RE | Disposition: A | Discharge status: Home / Self Care | Payer: Self-pay | Source: Home / Self Care | Attending: Surgery

## 2022-04-11 ENCOUNTER — Other Ambulatory Visit: Payer: Self-pay

## 2022-04-11 ENCOUNTER — Ambulatory Visit
Admission: RE | Admit: 2022-04-11 | Discharge: 2022-04-11 | Disposition: A | Discharge status: Home / Self Care | Payer: Medicare Other | Attending: Surgery | Admitting: Surgery

## 2022-04-11 ENCOUNTER — Encounter: Payer: Self-pay | Admitting: Surgery

## 2022-04-11 DIAGNOSIS — Z923 Personal history of irradiation: Secondary | ICD-10-CM | POA: Diagnosis not present

## 2022-04-11 DIAGNOSIS — K621 Rectal polyp: Secondary | ICD-10-CM

## 2022-04-11 DIAGNOSIS — M8580 Other specified disorders of bone density and structure, unspecified site: Secondary | ICD-10-CM | POA: Diagnosis not present

## 2022-04-11 DIAGNOSIS — I2510 Atherosclerotic heart disease of native coronary artery without angina pectoris: Secondary | ICD-10-CM | POA: Diagnosis not present

## 2022-04-11 DIAGNOSIS — Z853 Personal history of malignant neoplasm of breast: Secondary | ICD-10-CM | POA: Diagnosis not present

## 2022-04-11 DIAGNOSIS — E039 Hypothyroidism, unspecified: Secondary | ICD-10-CM | POA: Diagnosis not present

## 2022-04-11 DIAGNOSIS — Z85038 Personal history of other malignant neoplasm of large intestine: Secondary | ICD-10-CM | POA: Diagnosis not present

## 2022-04-11 DIAGNOSIS — Z01812 Encounter for preprocedural laboratory examination: Secondary | ICD-10-CM

## 2022-04-11 DIAGNOSIS — Z85828 Personal history of other malignant neoplasm of skin: Secondary | ICD-10-CM | POA: Diagnosis not present

## 2022-04-11 DIAGNOSIS — I10 Essential (primary) hypertension: Secondary | ICD-10-CM | POA: Insufficient documentation

## 2022-04-11 DIAGNOSIS — Z9049 Acquired absence of other specified parts of digestive tract: Secondary | ICD-10-CM | POA: Insufficient documentation

## 2022-04-11 DIAGNOSIS — D128 Benign neoplasm of rectum: Secondary | ICD-10-CM | POA: Diagnosis not present

## 2022-04-11 HISTORY — DX: Spinal stenosis, cervical region: M48.02

## 2022-04-11 HISTORY — DX: Basal cell carcinoma of skin of other part of trunk: C44.519

## 2022-04-11 HISTORY — DX: Other ill-defined heart diseases: I51.89

## 2022-04-11 HISTORY — DX: Cataract extraction status, right eye: Z98.42

## 2022-04-11 HISTORY — DX: Nonrheumatic aortic (valve) stenosis: I35.0

## 2022-04-11 HISTORY — PX: TUMOR EXCISION: SHX421

## 2022-04-11 HISTORY — DX: Other cervical disc degeneration, unspecified cervical region: M50.30

## 2022-04-11 HISTORY — DX: Thyrotoxicosis, unspecified without thyrotoxic crisis or storm: E05.90

## 2022-04-11 HISTORY — DX: Atherosclerosis of aorta: I70.0

## 2022-04-11 HISTORY — DX: Cataract extraction status, right eye: Z98.41

## 2022-04-11 HISTORY — DX: Cataract extraction status, left eye: Z98.41

## 2022-04-11 HISTORY — DX: Spondylosis without myelopathy or radiculopathy, cervical region: M47.812

## 2022-04-11 LAB — BASIC METABOLIC PANEL
Anion gap: 10 (ref 5–15)
BUN: 6 mg/dL — ABNORMAL LOW (ref 8–23)
CO2: 26 mmol/L (ref 22–32)
Calcium: 9.3 mg/dL (ref 8.9–10.3)
Chloride: 101 mmol/L (ref 98–111)
Creatinine, Ser: 0.45 mg/dL (ref 0.44–1.00)
GFR, Estimated: >60 mL/min (ref >60)
Glucose, Bld: 117 mg/dL — ABNORMAL HIGH (ref 70–99)
Potassium: 3.3 mmol/L — ABNORMAL LOW (ref 3.5–5.1)
Sodium: 137 mmol/L (ref 135–145)

## 2022-04-11 SURGERY — EXCISION, NEOPLASM, RECTUM
Anesthesia: General

## 2022-04-11 MED ORDER — FENTANYL CITRATE (PF) 100 MCG/2ML IJ SOLN: 25.0000 ug | INTRAMUSCULAR | Status: DC | PRN

## 2022-04-11 MED ORDER — CHLORHEXIDINE GLUCONATE CLOTH 2 % EX PADS: 6.0000 | MEDICATED_PAD | Freq: Once | CUTANEOUS | Status: DC

## 2022-04-11 MED ORDER — EPHEDRINE 5 MG/ML INJ
INTRAVENOUS | Status: AC
  Filled 2022-04-11: qty 5

## 2022-04-11 MED ORDER — CHLORHEXIDINE GLUCONATE 0.12 % MT SOLN: 15.0000 mL | Freq: Once | OROMUCOSAL | Status: AC

## 2022-04-11 MED ORDER — ONDANSETRON HCL 4 MG/2ML IJ SOLN
INTRAMUSCULAR | Status: DC | PRN
  Administered 2022-04-11: 4 mg via INTRAVENOUS

## 2022-04-11 MED ORDER — DEXMEDETOMIDINE HCL IN NACL 200 MCG/50ML IV SOLN
INTRAVENOUS | Status: DC | PRN
  Administered 2022-04-11: 8 ug via INTRAVENOUS

## 2022-04-11 MED ORDER — GABAPENTIN 300 MG PO CAPS: 300.0000 mg | ORAL_CAPSULE | ORAL | Status: AC

## 2022-04-11 MED ORDER — EPHEDRINE SULFATE (PRESSORS) 50 MG/ML IJ SOLN
INTRAMUSCULAR | Status: DC | PRN
  Administered 2022-04-11: 5 mg via INTRAVENOUS
  Administered 2022-04-11 (×2): 10 mg via INTRAVENOUS

## 2022-04-11 MED ORDER — LACTATED RINGERS IV SOLN: INTRAVENOUS | Status: DC

## 2022-04-11 MED ORDER — OXYCODONE HCL 5 MG PO TABS: 5.0000 mg | ORAL_TABLET | Freq: Once | ORAL | Status: DC | PRN

## 2022-04-11 MED ORDER — GABAPENTIN 300 MG PO CAPS
ORAL_CAPSULE | ORAL | Status: AC
  Administered 2022-04-11: 300 mg via ORAL
  Filled 2022-04-11: qty 1

## 2022-04-11 MED ORDER — SUGAMMADEX SODIUM 500 MG/5ML IV SOLN
INTRAVENOUS | Status: AC
  Filled 2022-04-11: qty 5

## 2022-04-11 MED ORDER — SUGAMMADEX SODIUM 200 MG/2ML IV SOLN
INTRAVENOUS | Status: DC | PRN
  Administered 2022-04-11: 200 mg via INTRAVENOUS

## 2022-04-11 MED ORDER — DIBUCAINE (PERIANAL) 1 % EX OINT
TOPICAL_OINTMENT | CUTANEOUS | Status: AC
  Filled 2022-04-11: qty 28

## 2022-04-11 MED ORDER — ROCURONIUM BROMIDE 100 MG/10ML IV SOLN
INTRAVENOUS | Status: DC | PRN
  Administered 2022-04-11: 30 mg via INTRAVENOUS

## 2022-04-11 MED ORDER — BUPIVACAINE LIPOSOME 1.3 % IJ SUSP
INTRAMUSCULAR | Status: DC | PRN
  Administered 2022-04-11: 20 mL

## 2022-04-11 MED ORDER — SEVOFLURANE IN SOLN
RESPIRATORY_TRACT | Status: AC
  Filled 2022-04-11: qty 250

## 2022-04-11 MED ORDER — GELATIN ABSORBABLE 100 CM EX MISC
CUTANEOUS | Status: AC
  Filled 2022-04-11: qty 1

## 2022-04-11 MED ORDER — BUPIVACAINE LIPOSOME 1.3 % IJ SUSP: 20.0000 mL | Freq: Once | INTRAMUSCULAR | Status: DC

## 2022-04-11 MED ORDER — FENTANYL CITRATE (PF) 100 MCG/2ML IJ SOLN
INTRAMUSCULAR | Status: DC | PRN
  Administered 2022-04-11: 100 ug via INTRAVENOUS

## 2022-04-11 MED ORDER — ACETAMINOPHEN 500 MG PO TABS
ORAL_TABLET | ORAL | Status: AC
  Administered 2022-04-11: 1000 mg via ORAL
  Filled 2022-04-11: qty 2

## 2022-04-11 MED ORDER — FAMOTIDINE 20 MG PO TABS
ORAL_TABLET | ORAL | Status: AC
  Administered 2022-04-11: 20 mg via ORAL
  Filled 2022-04-11: qty 1

## 2022-04-11 MED ORDER — FLEET ENEMA 7-19 GM/118ML RE ENEM
1.0000 | ENEMA | Freq: Once | RECTAL | Status: AC
  Administered 2022-04-11: 1 via RECTAL

## 2022-04-11 MED ORDER — ACETAMINOPHEN 500 MG PO TABS: 1000.0000 mg | ORAL_TABLET | ORAL | Status: AC

## 2022-04-11 MED ORDER — OXYCODONE HCL 5 MG/5ML PO SOLN: 5.0000 mg | Freq: Once | ORAL | Status: DC | PRN

## 2022-04-11 MED ORDER — ONDANSETRON HCL 4 MG/2ML IJ SOLN: 4.0000 mg | Freq: Once | INTRAMUSCULAR | Status: DC | PRN

## 2022-04-11 MED ORDER — FAMOTIDINE 20 MG PO TABS: 20.0000 mg | ORAL_TABLET | Freq: Once | ORAL | Status: AC

## 2022-04-11 MED ORDER — SUCCINYLCHOLINE CHLORIDE 200 MG/10ML IV SOSY
PREFILLED_SYRINGE | INTRAVENOUS | Status: DC | PRN
  Administered 2022-04-11: 100 mg via INTRAVENOUS

## 2022-04-11 MED ORDER — BUPIVACAINE-EPINEPHRINE (PF) 0.25% -1:200000 IJ SOLN
INTRAMUSCULAR | Status: AC
  Filled 2022-04-11: qty 30

## 2022-04-11 MED ORDER — ONDANSETRON HCL 4 MG/2ML IJ SOLN
INTRAMUSCULAR | Status: AC
  Filled 2022-04-11: qty 2

## 2022-04-11 MED ORDER — HYDROCODONE-ACETAMINOPHEN 5-325 MG PO TABS: 1.0000 | ORAL_TABLET | Freq: Four times a day (QID) | ORAL | 0 refills | Status: DC | PRN

## 2022-04-11 MED ORDER — FLEET ENEMA 7-19 GM/118ML RE ENEM: 1.0000 | ENEMA | Freq: Once | RECTAL | Status: DC

## 2022-04-11 MED ORDER — CELECOXIB 200 MG PO CAPS
ORAL_CAPSULE | ORAL | Status: AC
  Administered 2022-04-11: 200 mg via ORAL
  Filled 2022-04-11: qty 1

## 2022-04-11 MED ORDER — CHLORHEXIDINE GLUCONATE CLOTH 2 % EX PADS
6.0000 | MEDICATED_PAD | Freq: Once | CUTANEOUS | Status: AC
  Administered 2022-04-11: 6 via TOPICAL

## 2022-04-11 MED ORDER — DIBUCAINE 1 % EX OINT
TOPICAL_OINTMENT | CUTANEOUS | Status: DC | PRN
  Administered 2022-04-11: 1 via TOPICAL

## 2022-04-11 MED ORDER — PROPOFOL 10 MG/ML IV BOLUS
INTRAVENOUS | Status: DC | PRN
  Administered 2022-04-11: 150 mg via INTRAVENOUS

## 2022-04-11 MED ORDER — DEXAMETHASONE SODIUM PHOSPHATE 10 MG/ML IJ SOLN
INTRAMUSCULAR | Status: DC | PRN
  Administered 2022-04-11: 10 mg via INTRAVENOUS

## 2022-04-11 MED ORDER — GELATIN ABSORBABLE 100 EX MISC
CUTANEOUS | Status: DC | PRN
  Administered 2022-04-11: 1

## 2022-04-11 MED ORDER — LIDOCAINE HCL (PF) 2 % IJ SOLN
INTRAMUSCULAR | Status: AC
  Filled 2022-04-11: qty 5

## 2022-04-11 MED ORDER — ORAL CARE MOUTH RINSE: 15.0000 mL | Freq: Once | OROMUCOSAL | Status: AC

## 2022-04-11 MED ORDER — ROCURONIUM BROMIDE 10 MG/ML (PF) SYRINGE
PREFILLED_SYRINGE | INTRAVENOUS | Status: AC
  Filled 2022-04-11: qty 10

## 2022-04-11 MED ORDER — LIDOCAINE HCL (CARDIAC) PF 100 MG/5ML IV SOSY
PREFILLED_SYRINGE | INTRAVENOUS | Status: DC | PRN
  Administered 2022-04-11: 100 mg via INTRAVENOUS

## 2022-04-11 MED ORDER — CHLORHEXIDINE GLUCONATE 0.12 % MT SOLN
OROMUCOSAL | Status: AC
  Administered 2022-04-11: 15 mL via OROMUCOSAL
  Filled 2022-04-11: qty 15

## 2022-04-11 MED ORDER — PROPOFOL 10 MG/ML IV BOLUS
INTRAVENOUS | Status: AC
  Filled 2022-04-11: qty 20

## 2022-04-11 MED ORDER — BUPIVACAINE-EPINEPHRINE 0.25% -1:200000 IJ SOLN
INTRAMUSCULAR | Status: DC | PRN
  Administered 2022-04-11: 10 mL

## 2022-04-11 MED ORDER — BUPIVACAINE LIPOSOME 1.3 % IJ SUSP
INTRAMUSCULAR | Status: AC
  Filled 2022-04-11: qty 20

## 2022-04-11 MED ORDER — CELECOXIB 200 MG PO CAPS: 200.0000 mg | ORAL_CAPSULE | ORAL | Status: AC

## 2022-04-11 MED ORDER — DEXAMETHASONE SODIUM PHOSPHATE 10 MG/ML IJ SOLN
INTRAMUSCULAR | Status: AC
  Filled 2022-04-11: qty 1

## 2022-04-11 MED ORDER — FENTANYL CITRATE (PF) 100 MCG/2ML IJ SOLN
INTRAMUSCULAR | Status: AC
  Filled 2022-04-11: qty 2

## 2022-04-11 SURGICAL SUPPLY — 30 items
BLADE SURG 15 STRL LF DISP TIS (BLADE) ×1 IMPLANT
BLADE SURG 15 STRL SS (BLADE) ×1
BRIEF MESH DISP 2XL (UNDERPADS AND DIAPERS) ×1 IMPLANT
DRAPE LAPAROTOMY 100X77 ABD (DRAPES) ×1 IMPLANT
DRSG GAUZE FLUFF 36X18 (GAUZE/BANDAGES/DRESSINGS) ×1 IMPLANT
ELECT CAUTERY BLADE TIP 2.5 (TIP) ×1
ELECT REM PT RETURN 9FT ADLT (ELECTROSURGICAL) ×1
ELECTRODE CAUTERY BLDE TIP 2.5 (TIP) ×1 IMPLANT
ELECTRODE REM PT RTRN 9FT ADLT (ELECTROSURGICAL) ×1 IMPLANT
GAUZE 4X4 16PLY ~~LOC~~+RFID DBL (SPONGE) ×1 IMPLANT
GLOVE ORTHO TXT STRL SZ7.5 (GLOVE) ×1 IMPLANT
GOWN STRL REUS W/ TWL LRG LVL3 (GOWN DISPOSABLE) ×1 IMPLANT
GOWN STRL REUS W/ TWL XL LVL3 (GOWN DISPOSABLE) ×1 IMPLANT
GOWN STRL REUS W/TWL LRG LVL3 (GOWN DISPOSABLE) ×1
GOWN STRL REUS W/TWL XL LVL3 (GOWN DISPOSABLE) ×1
KIT TURNOVER KIT A (KITS) ×1 IMPLANT
MANIFOLD NEPTUNE II (INSTRUMENTS) ×1 IMPLANT
NEEDLE HYPO 22GX1.5 SAFETY (NEEDLE) ×1 IMPLANT
PACK BASIN MINOR ARMC (MISCELLANEOUS) ×1 IMPLANT
PAD ABD DERMACEA PRESS 5X9 (GAUZE/BANDAGES/DRESSINGS) IMPLANT
SOL PREP PVP 2OZ (MISCELLANEOUS) ×2
SOLUTION PREP PVP 2OZ (MISCELLANEOUS) ×1 IMPLANT
SURGILUBE 2OZ TUBE FLIPTOP (MISCELLANEOUS) ×1 IMPLANT
SUT CHROMIC 3 0 SH 27 (SUTURE) ×1 IMPLANT
SUT SILK 3-0 (SUTURE) IMPLANT
SWABSTK COMLB BENZOIN TINCTURE (MISCELLANEOUS) ×1 IMPLANT
SYR 10ML LL (SYRINGE) ×1 IMPLANT
TAPE CLOTH 3X10 WHT NS LF (GAUZE/BANDAGES/DRESSINGS) ×2 IMPLANT
TRAP FLUID SMOKE EVACUATOR (MISCELLANEOUS) ×1 IMPLANT
WATER STERILE IRR 500ML POUR (IV SOLUTION) ×1 IMPLANT

## 2022-04-11 NOTE — Anesthesia Preprocedure Evaluation (Addendum)
Anesthesia Evaluation  Patient identified by MRN, date of birth, ID band Patient awake    Reviewed: Allergy & Precautions, NPO status , Patient's Chart, lab work & pertinent test results  History of Anesthesia Complications Negative for: history of anesthetic complications  Airway Mallampati: II  TM Distance: >3 FB Neck ROM: Full    Dental  (+) Teeth Intact, Caps   Pulmonary neg pulmonary ROS, neg sleep apnea, neg COPD, Patient abstained from smoking.Not current smoker,    Pulmonary exam normal breath sounds clear to auscultation       Cardiovascular Exercise Tolerance: Good METShypertension, + CAD  (-) Past MI + dysrhythmias + pacemaker + Valvular Problems/Murmurs  Rhythm:Regular Rate:Normal - Systolic murmurs ? TTE performed on 08/25/2018 revealed normal left trickle systolic function with an EF of >55%.  There was moderate concentric LVH. Left ventricular diastolic Doppler parameters consistent with abnormal relaxation (G1DD).  There was trivial to mild pan valvular regurgitation noted.  Degree of aortic valve stenosis had progressed to moderate with a mean pressure gradient of 42.3 mmHg; AVA (VTI) = 0.82 cm.  ? Repeat TTE performed on 05/30/2020 revealed a normal left trickle systolic function with an EF of >55%.  Left atrium moderately dilated.  There was trivial mitral and tricuspid valve regurgitation.  Degree of stenosis had progressed to severe with a mean pressure gradient of 47.2 mmHg; AVA (VTI) = 0.59 cm.  ? Diagnostic RIGHT/LEFT heart catheterization was performed on 08/11/2020.  There was no evidence of obstructive coronary artery disease.  Wedge pressure was mildly elevated.  Cardiac output and index were elevated in the context of anemia (hemoglobin 9 g/dL).  There was no evidence of intra-atrial shunting noted.  ? Patient ultimately underwent TAVR procedure via a RIGHT percutaneous femoral approach at [facility] on 08/31/2020.  A 23 mm Edwards SAPIEN 3 ultra bioprosthetic valve was placed.  Procedure was complicated by intra/postoperative complete heart block requiring placement of a transvenous pacemaker.  Postoperatively, when unable to discontinue transvenous pacing, electrophysiology was consulted and the decision was made to place a permanent implanted cardiac device.  Medtronic device, with LEFT bundle lead, was placed on 09/03/2020.  ? Most recent TTE was performed on 12/11/2021 revealing a left ventricular systolic function with moderate concentric LVH; LVEF >55%. Left ventricular diastolic Doppler parameters consistent with abnormal relaxation (G1DD).  There was mild mitral annular calcification.  There was mild biatrial and mild right ventricular enlargement.  Trivial to mild mitral, tricuspid, and pulmonary valve regurgitation noted.  Bioprosthetic aortic valve well-seated and noted to be functioning properly; mean transit bioprosthetic valve gradient was 30.4 mmHg; AVA (VTI) = 1.3 cm.   Neuro/Psych  Neuromuscular disease negative neurological ROS  negative psych ROS   GI/Hepatic neg GERD  ,(+)     (-) substance abuse  ,   Endo/Other  neg diabetesHyperthyroidism   Renal/GU negative Renal ROS     Musculoskeletal   Abdominal   Peds  Hematology   Anesthesia Other Findings Past Medical History: 10/20/2019: Adenocarcinoma of colon ([place])     Comment:  a.) stage IIIb; b.) RIGHT colon mass Bx (+) for invasive              moderately differentiated adenocarcinoma (pT3 pN1a) No date: Anemia No date: Aortic atherosclerosis ([place]) No date: Aortic stenosis     Comment:  a.) TTE 08/17/2016: EF >55%, mild AS (MPG 29.5); b.) TTE  08/25/2018: EF >55%; mod AS (MPG 42.3); c.) TTE               06/17/2020: EF >55%; severe AS (MPG 47.2); d.) s/p TAVR               08/31/2020 at [facility] - 23 mm Edwards Sapien 3 Ultra valve               placed via a RIGHT percutaneous  femoral approach -->               complicated by intra/postoperative CHB requiring TVP and               ultimate PPM placement No date: Arthritis No date: Basal cell carcinoma (BCC) of back 2011: Breast cancer, right ([place])     Comment:  a.) stage Ia IMC (ER/PR +, HER2/neu -); b.) lumpectomy +              XRT in 2011; c.) completed 5 years AI therapy               (anastrozole) in 11/2014 No date: Cervical spinal stenosis No date: Cervical spondylosis No date: Chemotherapy induced nausea and vomiting 08/31/2020: Complete heart block (HCC)     Comment:  a.) TVP placed intraoperatively during TAVR secondary to              development of CHB; b.) Medtronic PPM placed 09/03/2020               for postoperative continued postoperative CHB No date: Coronary artery disease     Comment:  a.) RHC 08/11/2020: no sig CAD No date: DDD (degenerative disc disease), cervical No date: Diastolic dysfunction     Comment:  a.) TTE 08/17/2016: EF >55%, mild LVH, mild LAE, triv               panvalvular regurg, G1DD; b.) TTE 08/25/2018: EF >55%,               mod LVH, mild LAE, triv AR/TR/PR, mild MR, G1dd; c.) TTE               08/11/2020: EF >55%, mild LVHH, mod LAE, triv MR/TR 03/2021: Guillain Barr syndrome (HCC) No date: Heart murmur No date: History of bilateral cataract extraction No date: Hypertension No date: Hyperthyroidism No date: Neuropathy No date: Osteopenia No date: PONV (postoperative nausea and vomiting) 09/03/2020: Presence of permanent cardiac pacemaker     Comment:  a.) TVP placed intraoperatively during TAVR secondary to              development of CHB; b.) Medtronic PPM placed 09/03/2020               for postoperative continued postoperative CHB 08/31/2020: S/P TAVR (transcatheter aortic valve replacement)     Comment:  a.) s/p TAVR 08/31/2020 at [facility] - 23 mm Edwards Sapien 3              Ultra valve placed via a RIGHT percutaneous femoral               approach -->  complicated by intra/postoperative CHB               requiring TVP and ultimate PPM placement 01/25/2022: Thyroid nodule     Comment:  a.) CT C-spine 01/25/2022: 22 mm right thyroid nodule  Reproductive/Obstetrics  Anesthesia Evaluation  Patient identified by MRN, date of birth, ID band Patient awake    Reviewed: Allergy & Precautions, NPO status , Patient's Chart, lab work & pertinent test results  History of Anesthesia Complications Negative for: history of anesthetic complications  Airway Mallampati: III  TM Distance: <3 FB Neck ROM: full    Dental  (+) Chipped   Pulmonary neg pulmonary ROS, neg shortness of breath,    Pulmonary exam normal        Cardiovascular Exercise Tolerance: Good hypertension, Normal cardiovascular exam+ dysrhythmias + pacemaker + Valvular Problems/Murmurs      Neuro/Psych  Neuromuscular disease negative psych ROS   GI/Hepatic negative GI ROS, Neg liver ROS,   Endo/Other  Hypothyroidism Hyperthyroidism   Renal/GU negative Renal ROS  negative genitourinary   Musculoskeletal   Abdominal   Peds  Hematology negative hematology ROS (+)   Anesthesia Other Findings Past Medical History: No date: Anemia No date: Arthritis No date: Basal cell carcinoma     Comment:  back 2010: Breast cancer ([place])     Comment:  RT LUMPECTOMY No date: Broken humerus     Comment:  left arm, march 2023 No date: Cancer [facility])     Comment:  rt breast No date: Chemotherapy induced nausea and vomiting 2021: Colon cancer ([place])     Comment:  colon cancer No date: Heart murmur No date: Hypertension No date: Hypothyroidism     Comment:  currently not on meds No date: Neuropathy No date: Osteopenia 2010: Personal history of chemotherapy     Comment:  right breast ca 2010: Personal history of radiation therapy     Comment:  right breast ca No date: Port-A-Cath in  place  Past Surgical History: 08/11/2020: aortic valve stenosis No date: BACK SURGERY 03/2009: BREAST EXCISIONAL BIOPSY; Right     Comment:  radiation and chemo + No date: BREAST EXCISIONAL BIOPSY; Bilateral     Comment:  NEG No date: BREAST SURGERY No date: CATARACT EXTRACTION W/ INTRAOCULAR LENS  IMPLANT, BILATERAL No date: CHOLECYSTECTOMY 10/19/2019: COLONOSCOPY WITH PROPOFOL; N/A     Comment:  Procedure: COLONOSCOPY WITH PROPOFOL-attempted, unable               to perform poor prep;  Surgeon: Lucilla Lame, MD;                Location: [place];  Service: Endoscopy;                Laterality: N/A;  priority 3 10/20/2019: COLONOSCOPY WITH PROPOFOL; N/A     Comment:  Procedure: COLONOSCOPY WITH PROPOFOL;  Surgeon: Lucilla Lame, MD;  Location: [facility];  Service:               Endoscopy;  Laterality: N/A; 11/16/2021: COLONOSCOPY WITH PROPOFOL; N/A     Comment:  Procedure: COLONOSCOPY WITH PROPOFOL;  Surgeon: Lucilla Lame, MD;  Location: [facility];  Service:               Endoscopy;  Laterality: N/A; 10/19/2019: ESOPHAGEAL DILATION     Comment:  Procedure: ESOPHAGEAL DILATION;  Surgeon: Lucilla Lame,               MD;  Location: [place];  Service: Endoscopy;; 10/19/2019: ESOPHAGOGASTRODUODENOSCOPY (EGD) WITH PROPOFOL; N/A     Comment:  Procedure:  ESOPHAGOGASTRODUODENOSCOPY (EGD) WITH               PROPOFOL;  Surgeon: Lucilla Lame, MD;  Location: [place];  Service: Endoscopy;  Laterality: N/A; 11/23/2019: PORTACATH PLACEMENT; Right     Comment:  Procedure: INSERTION PORT-A-CATH;  Surgeon: Ronny Bacon, MD;  Location: [facility];  Service: General;                Laterality: Right;     Reproductive/Obstetrics negative OB ROS                             Anesthesia Physical Anesthesia Plan  ASA: 3  Anesthesia Plan: General   Post-op Pain Management:     Induction: Intravenous  PONV Risk Score and Plan: Propofol infusion and TIVA  Airway Management Planned: Natural Airway and Nasal Cannula  Additional Equipment:   Intra-op Plan:   Post-operative Plan:   Informed Consent: I have reviewed the patients History and Physical, chart, labs and discussed the procedure including the risks, benefits and alternatives for the proposed anesthesia with the patient or authorized representative who has indicated his/her understanding and acceptance.     Dental Advisory Given  Plan Discussed with: Anesthesiologist, CRNA and Surgeon  Anesthesia Plan Comments: (Patient consented for risks of anesthesia including but not limited to:  - adverse reactions to medications - risk of airway placement if required - damage to eyes, teeth, lips or other oral mucosa - nerve damage due to positioning  - sore throat or hoarseness - Damage to heart, brain, nerves, lungs, other parts of body or loss of life  Patient voiced understanding.)        Anesthesia Quick Evaluation  Anesthesia Physical Anesthesia Plan  ASA: 3  Anesthesia Plan: General   Post-op Pain Management: Gabapentin PO (pre-op)*, Celebrex PO (pre-op)* and Tylenol PO (pre-op)*   Induction: Intravenous  PONV Risk Score and Plan: 3 and Ondansetron, Dexamethasone and Treatment may vary due to age or medical condition  Airway Management Planned: LMA  Additional Equipment: None  Intra-op Plan:   Post-operative Plan: Extubation in OR  Informed Consent: I have reviewed the patients History and Physical, chart, labs and discussed the procedure including the risks, benefits and alternatives for the proposed anesthesia with the patient or authorized representative who has indicated his/her understanding and acceptance.     Dental advisory given  Plan Discussed with: CRNA and Surgeon  Anesthesia Plan Comments: (Discussed risks of anesthesia with patient, including PONV, sore  throat, lip/dental/eye damage. Rare risks discussed as well, such as cardiorespiratory and neurological sequelae, and allergic reactions. Discussed the role of CRNA in patient's perioperative care. Patient understands.)        Anesthesia Quick Evaluation

## 2022-04-11 NOTE — Discharge Instructions (Signed)

## 2022-04-11 NOTE — Transfer of Care (Signed)
Immediate Anesthesia Transfer of Care Note  Patient: Maria Davies  Procedure(s) Performed: TUMOR EXCISION RECTAL  Patient Location: PACU  Anesthesia Type:General  Level of Consciousness: drowsy and patient cooperative  Airway & Oxygen Therapy: Patient Spontanous Breathing and Patient connected to face mask oxygen  Post-op Assessment: Report given to RN and Post -op Vital signs reviewed and stable  Post vital signs: Reviewed and stable  Last Vitals:  Vitals Value Taken Time  BP 158/75 04/11/22 1534  Temp 36.2 C 04/11/22 1534  Pulse 98 04/11/22 1541  Resp 17 04/11/22 1541  SpO2 100 % 04/11/22 1541  Vitals shown include unvalidated device data.  Last Pain:  Vitals:   04/11/22 1534  TempSrc:   PainSc: 0-No pain         Complications: No notable events documented.

## 2022-04-11 NOTE — Op Note (Signed)
Transanal excision of rectal polyp.  Pre-operative Diagnosis: Adenomatous rectal polyp  Post-operative Diagnosis: same.    Surgeon: Ronny Bacon, M.D., [facility]  Anesthesia: General endotracheal  Findings: As expected limited to sessile rectal polyp overlying prominent prolapsing hemorrhoid, located on the patient's anterior right.  Estimated Blood Loss: 15 mL         Specimens: Anterior right hemorrhoid with overlying residual sessile polyp.          Complications: none              Procedure Details  The patient was seen again in the Holding Room. The benefits, complications, treatment options, and expected outcomes were discussed with the patient. The risks of bleeding, infection, recurrence of symptoms, failure to resolve symptoms, unanticipated injury, prosthetic placement, prosthetic infection, any of which could require further surgery were reviewed with the patient. The likelihood of improving the patient's symptoms with return to their baseline status is expected.  The patient and/or family concurred with the proposed plan, giving informed consent.  The patient was taken to Operating Room, identified and the procedure verified.    Prior to the induction of general anesthesia, antibiotic prophylaxis was administered. VTE prophylaxis was in place.  General anesthesia was then administered and tolerated well. After the induction, the patient was positioned in the prone/jackknife position, the buttocks were taped apart utilizing silk tape over benzoin, and the perianal area was prepped with Betadine and draped in the sterile fashion.  A Time Out was held and the above information confirmed.  Local infiltration of the anterior right quadrant was completed with quarter percent Marcaine with epinephrine.  An external angled incision was made to complete the external component of a diamond-shaped excision.  Utilizing the LigaSure exact, the hemorrhoidal tissue was dissected off of the  internal sphincteric ring.  Tissue was then divided anteriorly and laterally with the LigaSure.  3-0 chromic was placed at the apex within the distal rectum.  We then proceeded with excision of the hemorrhoid proximal to that point.  With excellent hemostasis we then utilized the 3-0 chromic to close the rectal mucosa with a locking hemostatic suture. We left the external most apex open for drainage. Local infiltration circumferentially about the anal sphincteric mechanism is completed with Exparel. Dibucaine Gelfoam roll was then placed within the distal rectum. Fluffs applied with ABD and mesh briefs to secure.        Ronny Bacon M.D., [facility] 04/11/2022 3:34 PM

## 2022-04-11 NOTE — Interval H&P Note (Signed)
History and Physical Interval Note:  04/11/2022 1:59 PM  Maria Davies  has presented today for surgery, with the diagnosis of rectal polyp.  The various methods of treatment have been discussed with the patient and family. After consideration of risks, benefits and other options for treatment, the patient has consented to  Procedure(s): TUMOR EXCISION RECTAL (N/A) as a surgical intervention.  The patient's history has been reviewed, patient examined, no change in status, stable for surgery.  I have reviewed the patient's chart and labs.  Questions were answered to the patient's satisfaction.     Ronny Bacon

## 2022-04-11 NOTE — Anesthesia Postprocedure Evaluation (Signed)
Anesthesia Post Note  Patient: Maria Davies  Procedure(s) Performed: TUMOR EXCISION RECTAL  Patient location during evaluation: PACU Anesthesia Type: General Level of consciousness: awake and alert Pain management: pain level controlled Vital Signs Assessment: post-procedure vital signs reviewed and stable Respiratory status: spontaneous breathing, nonlabored ventilation and respiratory function stable Cardiovascular status: blood pressure returned to baseline and stable Postop Assessment: no apparent nausea or vomiting Anesthetic complications: no   No notable events documented.   Last Vitals:  Vitals:   04/11/22 1600 04/11/22 1620  BP:  (!) 146/83  Pulse: 85 83  Resp: 18 15  Temp: (!) 36.2 C (!) 36.1 C  SpO2: 99% 99%    Last Pain:  Vitals:   04/11/22 1620  TempSrc: Temporal  PainSc: 0-No pain                 [place] Ouch

## 2022-04-11 NOTE — Anesthesia Procedure Notes (Signed)
Procedure Name: Intubation Date/Time: 04/11/2022 2:33 PM  Performed by: Jonna Clark, CRNAPre-anesthesia Checklist: Patient identified, Patient being monitored, Timeout performed, Emergency Drugs available and Suction available Patient Re-evaluated:Patient Re-evaluated prior to induction Oxygen Delivery Method: Circle system utilized Preoxygenation: Pre-oxygenation with 100% oxygen Induction Type: IV induction Ventilation: Mask ventilation without difficulty Laryngoscope Size: 3 and McGraph Grade View: Grade I Tube type: Oral Tube size: 6.5 mm Number of attempts: 1 Airway Equipment and Method: Stylet Placement Confirmation: ETT inserted through vocal cords under direct vision, positive ETCO2 and breath sounds checked- equal and bilateral Secured at: 21 cm Tube secured with: Tape Dental Injury: Teeth and Oropharynx as per pre-operative assessment

## 2022-04-12 ENCOUNTER — Encounter: Payer: Self-pay | Admitting: Surgery

## 2022-04-16 ENCOUNTER — Other Ambulatory Visit: Payer: Self-pay | Admitting: Family Medicine

## 2022-04-16 DIAGNOSIS — I10 Essential (primary) hypertension: Secondary | ICD-10-CM

## 2022-04-16 LAB — SURGICAL PATHOLOGY

## 2022-04-17 NOTE — Telephone Encounter (Signed)
Requested Prescriptions  Pending Prescriptions Disp Refills  . metoprolol succinate (TOPROL-XL) 50 MG 24 hr tablet [Pharmacy Med Name: METOPROLOL SUCCINATE ER 50 MG TAB] 90 tablet 0    Sig: TAKE 1 TABLET BY MOUTH ONCE DAILY WITH OR IMMEDIATELY FOLLOWING A MEAL     Cardiovascular:  Beta Blockers Failed - 04/16/2022  1:19 PM      Failed - Last BP in normal range    BP Readings from Last 1 Encounters:  04/11/22 (!) 146/83         Passed - Last Heart Rate in normal range    Pulse Readings from Last 1 Encounters:  04/11/22 83         Passed - Valid encounter within last 6 months    Recent Outpatient Visits          5 months ago Hypertension, unspecified type   [place], [place], DO   7 months ago Fall, subsequent encounter   [employer], [place], MD   8 months ago Hypertension, unspecified type   [facility], Megan P, DO   10 months ago Hypertension, unspecified type   [place], [place], DO   11 months ago Guillain Barr syndrome [facility])   [place], Barb Merino, DO      Future Appointments            In 2 weeks Wynetta Emery, Barb Merino, DO [employer], [facility]

## 2022-04-26 ENCOUNTER — Encounter: Payer: Self-pay | Admitting: Surgery

## 2022-04-26 ENCOUNTER — Ambulatory Visit (INDEPENDENT_AMBULATORY_CARE_PROVIDER_SITE_OTHER): Payer: Medicare Other | Admitting: Surgery

## 2022-04-26 VITALS — BP 147/75 | HR 90 | Temp 98.0°F | Wt 187.0 lb

## 2022-04-26 DIAGNOSIS — K621 Rectal polyp: Secondary | ICD-10-CM

## 2022-04-26 DIAGNOSIS — Z09 Encounter for follow-up examination after completed treatment for conditions other than malignant neoplasm: Secondary | ICD-10-CM

## 2022-04-26 NOTE — Progress Notes (Signed)
[facility] POST-OP OFFICE VISIT  04/26/2022  HPI: Maria Davies is a 81 y.o. female 15 days s/p transanal polypectomy.  She seems to be doing well, having no pain, reporting that she never got her pain meds filled.  Minimal discomfort on bowel movements.  No bleeding.  No nausea, vomiting, fevers or chills.   SURGICAL PATHOLOGY  CASE: [id number]  PATIENT: Maria Davies  Surgical Pathology Report   Specimen Submitted:  A. Rectum polyp   Clinical History: Rectal polyp   DIAGNOSIS:  A. RECTUM POLYP; EXCISION:  - POLYPOID COLONIC-TYPE MUCOSA WITH SUPERFICIAL ULCERATION AND  PROLAPSE-TYPE CHANGE.  - FOCAL SQUAMOUS MUCOSA IS PRESENT.  - SEE COMMENT.  - SMALL HYPERPLASTIC POLYP.  - FOCAL DILATED SUBMUCOSAL VESSELS SUGGESTIVE OF HEMORRHOID.  - NEGATIVE FOR DYSPLASIA AND MALIGNANCY.   Comment:  The differential diagnosis for the findings includes solitary rectal  ulcer syndrome (SRUS) and inflammatory cloacogenic polyp (ICP). While  the presence of focal squamous mucosa would favor an inflammatory  cloacogenic polyp correlation with clinical impression is required.   Vital signs: There were no vitals taken for this visit.   Physical Exam: Constitutional: She appears well  Levada Dy present as chaperone: DRE completed.  As expected the area of the prolapsing hemorrhoid/polyp is scarred and healing well.  No evidence of bleeding or ulceration.  In review of her pathology report not correlating perfectly with her original biopsy, I suspect I may have overlooked a much more subtle lesion on her left.  I feel little irregularity there in the distal rectum, mobile and sessile.  Otherwise there are some external hemorrhoidal tags present.  Assessment/Plan: This is a 81 y.o. female 15 days s/p transanal hemorrhoidectomy prolapsing hemorrhoid/anal polyp.  May still have small serrated polyp present, that was overlooked during her exam under anesthesia.  We discussed  close follow-up and reevaluation in 2 months, and consideration of return to the OR for removal/biopsy of this other area.  In the interim we will give her the opportunity to fully recuperate/heal from her last procedure.  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   Bilateral leg weakness 03/31/2021   Presence of permanent cardiac pacemaker 12/13/2020   Aortic atherosclerosis ([place]) 09/07/2020   Postoperative anemia 09/01/2020   Complete heart block ([place]) 08/31/2020   Post-operative pain 08/31/2020   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   Goals of care, counseling/discussion 11/19/2019   Status post laparoscopic colectomy 11/17/2019   Colon cancer ([place]) 10/28/2019   Iron deficiency anemia secondary to blood loss (chronic)    Polyp of sigmoid colon    Benign neoplasm of cecum    Intestines neoplasm    Stricture and stenosis of esophagus    Advanced care planning/counseling discussion 07/22/2017   Hypercholesteremia 07/03/2016   Osteoporosis 07/03/2016   Carcinoma of right breast ([place]) 06/19/2016   Aortic stenosis, mild 05/02/2015   Hypertension 05/02/2015   Hyperthyroidism 05/02/2015   Breast cancer in female [facility]) 05/02/2015      Ronny Bacon M.D., FACS 04/26/2022, 8:41 AM

## 2022-04-26 NOTE — Patient Instructions (Signed)
If you have any concerns or questions, please feel free to call our office.    

## 2022-05-07 ENCOUNTER — Encounter: Payer: Self-pay | Admitting: Family Medicine

## 2022-05-07 ENCOUNTER — Telehealth: Payer: Self-pay

## 2022-05-07 ENCOUNTER — Ambulatory Visit (INDEPENDENT_AMBULATORY_CARE_PROVIDER_SITE_OTHER): Payer: Medicare Other | Admitting: Family Medicine

## 2022-05-07 VITALS — BP 136/80 | HR 83 | Temp 98.2°F | Ht 63.0 in | Wt 192.1 lb

## 2022-05-07 DIAGNOSIS — E0590 Thyrotoxicosis, unspecified without thyrotoxic crisis or storm: Secondary | ICD-10-CM | POA: Diagnosis not present

## 2022-05-07 DIAGNOSIS — Z780 Asymptomatic menopausal state: Secondary | ICD-10-CM | POA: Diagnosis not present

## 2022-05-07 DIAGNOSIS — I700 Atherosclerosis of aorta: Secondary | ICD-10-CM

## 2022-05-07 DIAGNOSIS — I10 Essential (primary) hypertension: Secondary | ICD-10-CM

## 2022-05-07 DIAGNOSIS — D649 Anemia, unspecified: Secondary | ICD-10-CM

## 2022-05-07 DIAGNOSIS — E7800 Pure hypercholesterolemia, unspecified: Secondary | ICD-10-CM

## 2022-05-07 LAB — URINALYSIS, ROUTINE W REFLEX MICROSCOPIC
Bilirubin, UA: NEGATIVE
Glucose, UA: NEGATIVE
Ketones, UA: NEGATIVE
Nitrite, UA: NEGATIVE
Protein,UA: NEGATIVE
RBC, UA: NEGATIVE
Specific Gravity, UA: <1.005 — ABNORMAL LOW (ref 1.005–1.030)
Urobilinogen, Ur: 0.2 mg/dL (ref 0.2–1.0)
pH, UA: 5.5 (ref 5.0–7.5)

## 2022-05-07 LAB — MICROALBUMIN, URINE WAIVED
Creatinine, Urine Waived: 50 mg/dL (ref 10–300)
Microalb, Ur Waived: 10 mg/L (ref 0–19)
Microalb/Creat Ratio: <30 mg/g (ref <30)

## 2022-05-07 LAB — MICROSCOPIC EXAMINATION: Bacteria, UA: NONE SEEN

## 2022-05-07 NOTE — Assessment & Plan Note (Signed)
Under good control on current regimen. Continue current regimen. Continue to monitor. Call with any concerns. Refills given. Labs drawn today.   

## 2022-05-07 NOTE — Assessment & Plan Note (Addendum)
Rechecking labs today. Await results. Treat as needed.  °

## 2022-05-07 NOTE — Telephone Encounter (Signed)
-----   Message from Valerie Roys, [place] sent at 05/07/2022  9:42 AM EST ----- Can we please get Maria Davies scheduled for dexa

## 2022-05-07 NOTE — Telephone Encounter (Signed)
Patient notified and verbalized understanding. 

## 2022-05-07 NOTE — Telephone Encounter (Signed)
Patient is scheduled for DEXA on 07/16/22 at [place] at the [facility] location.

## 2022-05-07 NOTE — Progress Notes (Signed)
BP 136/80   Pulse 83   Temp 98.2 F (36.8 C) (Oral)   Ht '5\' 3"'$  (1.6 m)   Wt 192 lb 1.6 oz (87.1 kg)   SpO2 98%   BMI 34.03 kg/m    Subjective:    Patient ID: Maria Davies, female    DOB: 08/25/1940, 81 y.o.   MRN: [id number]  HPI: Maria Davies is a 81 y.o. female  Chief Complaint  Patient presents with   Hypertension   Aortic Atherosclerosis   Hyperthyroidism   HYPERTENSION / [place] Satisfied with current treatment? yes Duration of hypertension: chronic BP monitoring frequency: not checking BP medication side effects: no Past BP meds: amlodipine, benazepril, metoprolol Duration of hyperlipidemia: chronic Cholesterol medication side effects: N/A Cholesterol supplements: none Past cholesterol medications: none Medication compliance: excellent compliance Aspirin: yes Recent stressors: no Recurrent headaches: no Visual changes: no Palpitations: no Dyspnea: no Chest pain: no Lower extremity edema: no Dizzy/lightheaded: no  HYPERTHYROIDISM Thyroid control status:stable Satisfied with current treatment? yes Medication side effects: no Medication compliance: excellent compliance Recent dose adjustment:yes Fatigue: no Cold intolerance: no Heat intolerance: no Weight gain: no Weight loss: no Constipation: no Diarrhea/loose stools: no Palpitations: no Lower extremity edema: no Anxiety/depressed mood: no  Relevant past medical, surgical, family and social history reviewed and updated as indicated. Interim medical history since our last visit reviewed. Allergies and medications reviewed and updated.  Review of Systems  Constitutional: Negative.   Respiratory: Negative.    Cardiovascular: Negative.   Gastrointestinal: Negative.   Musculoskeletal: Negative.   Neurological: Negative.   Psychiatric/Behavioral: Negative.      Per HPI unless specifically indicated above     Objective:    BP 136/80   Pulse 83   Temp 98.2 F (36.8 C)  (Oral)   Ht '5\' 3"'$  (1.6 m)   Wt 192 lb 1.6 oz (87.1 kg)   SpO2 98%   BMI 34.03 kg/m   Wt Readings from Last 3 Encounters:  05/07/22 192 lb 1.6 oz (87.1 kg)  04/26/22 187 lb (84.8 kg)  04/11/22 185 lb (83.9 kg)    Physical Exam Vitals and nursing note reviewed.  Constitutional:      General: She is not in acute distress.    Appearance: Normal appearance. She is not ill-appearing, toxic-appearing or diaphoretic.  HENT:     Head: Normocephalic and atraumatic.     Right Ear: External ear normal.     Left Ear: External ear normal.     Nose: Nose normal.     Mouth/Throat:     Mouth: Mucous membranes are moist.     Pharynx: Oropharynx is clear.  Eyes:     General: No scleral icterus.       Right eye: No discharge.        Left eye: No discharge.     Extraocular Movements: Extraocular movements intact.     Conjunctiva/sclera: Conjunctivae normal.     Pupils: Pupils are equal, round, and reactive to light.  Cardiovascular:     Rate and Rhythm: Normal rate and regular rhythm.     Pulses: Normal pulses.     Heart sounds: Normal heart sounds. No murmur heard.    No friction rub. No gallop.  Pulmonary:     Effort: Pulmonary effort is normal. No respiratory distress.     Breath sounds: Normal breath sounds. No stridor. No wheezing, rhonchi or rales.  Chest:     Chest wall: No tenderness.  Musculoskeletal:  General: Normal range of motion.     Cervical back: Normal range of motion and neck supple.  Skin:    General: Skin is warm and dry.     Capillary Refill: Capillary refill takes less than 2 seconds.     Coloration: Skin is not jaundiced or pale.     Findings: No bruising, erythema, lesion or rash.  Neurological:     General: No focal deficit present.     Mental Status: She is alert and oriented to person, place, and time. Mental status is at baseline.  Psychiatric:        Mood and Affect: Mood normal.        Behavior: Behavior normal.        Thought Content: Thought  content normal.        Judgment: Judgment normal.     Results for orders placed or performed in visit on 05/07/22  Microscopic Examination   Urine  Result Value Ref Range   WBC, UA 0-5 0 - 5 /hpf   RBC, Urine 0-2 0 - 2 /hpf   Epithelial Cells (non renal) 0-10 0 - 10 /hpf   Bacteria, UA None seen None seen/Few  Urinalysis, Routine w reflex microscopic  Result Value Ref Range   Specific Gravity, UA <1.005 (L) 1.005 - 1.030   pH, UA 5.5 5.0 - 7.5   Color, UA Yellow Yellow   Appearance Ur Clear Clear   Leukocytes,UA Trace (A) Negative   Protein,UA Negative Negative/Trace   Glucose, UA Negative Negative   Ketones, UA Negative Negative   RBC, UA Negative Negative   Bilirubin, UA Negative Negative   Urobilinogen, Ur 0.2 0.2 - 1.0 mg/dL   Nitrite, UA Negative Negative   Microscopic Examination See below:   Microalbumin, Urine Waived  Result Value Ref Range   Microalb, Ur Waived 10 0 - 19 mg/L   Creatinine, Urine Waived 50 10 - 300 mg/dL   Microalb/Creat Ratio <30 <30 mg/g      Assessment & Plan:   Problem List Items Addressed This Visit       Cardiovascular and Mediastinum   Hypertension - Primary    Under good control on current regimen. Continue current regimen. Continue to monitor. Call with any concerns. Refills given. Labs drawn today.        Relevant Orders   Comprehensive metabolic panel   Urinalysis, Routine w reflex microscopic (Completed)   Microalbumin, Urine Waived (Completed)   Aortic atherosclerosis (HCC)    Under good control on current regimen. Continue current regimen. Continue to monitor. Call with any concerns. Refills given. Labs drawn today.       Relevant Orders   Comprehensive metabolic panel   Lipid Panel w/o Chol/HDL Ratio     Endocrine   Hyperthyroidism    Stable. Continue to follow with endocrinology. Rechecking labs today to save her a stick- we will send them to Dr. Gabriel Carina when they're back. Call with any concerns.       Relevant  Medications   methimazole (TAPAZOLE) 10 MG tablet   Other Relevant Orders   Thyroid Panel With TSH     Other   Hypercholesteremia    Rechecking labs today. Await results. Treat as needed.       Postoperative anemia    Rechecking labs today. Await results. Treat as needed.       Relevant Orders   CBC with Differential/Platelet   Other Visit Diagnoses     Postmenopausal estrogen deficiency  Due for DEXA. Scheduled today.   Relevant Orders   DG Bone Density        Follow up plan: Return in about 6 months (around 11/05/2022).

## 2022-05-07 NOTE — Assessment & Plan Note (Signed)
Stable. Continue to follow with endocrinology. Rechecking labs today to save her a stick- we will send them to Dr. Gabriel Carina when they're back. Call with any concerns.

## 2022-05-07 NOTE — Assessment & Plan Note (Signed)
Rechecking labs today. Await results. Treat as needed.  °

## 2022-05-08 ENCOUNTER — Encounter: Payer: Self-pay | Admitting: Family Medicine

## 2022-05-08 LAB — THYROID PANEL WITH TSH
Free Thyroxine Index: 2 (ref 1.2–4.9)
T3 Uptake Ratio: 30 % (ref 24–39)
T4, Total: 6.7 ug/dL (ref 4.5–12.0)
TSH: 1.05 u[IU]/mL (ref 0.450–4.500)

## 2022-05-08 LAB — CBC WITH DIFFERENTIAL/PLATELET
Basophils Absolute: 0.1 10*3/uL (ref 0.0–0.2)
Basos: 1 %
EOS (ABSOLUTE): 0.3 10*3/uL (ref 0.0–0.4)
Eos: 4 %
Hematocrit: 42.3 % (ref 34.0–46.6)
Hemoglobin: 14.7 g/dL (ref 11.1–15.9)
Immature Grans (Abs): 0 10*3/uL (ref 0.0–0.1)
Immature Granulocytes: 1 %
Lymphocytes Absolute: 1.7 10*3/uL (ref 0.7–3.1)
Lymphs: 24 %
MCH: 35.2 pg — ABNORMAL HIGH (ref 26.6–33.0)
MCHC: 34.8 g/dL (ref 31.5–35.7)
MCV: 101 fL — ABNORMAL HIGH (ref 79–97)
Monocytes Absolute: 0.7 10*3/uL (ref 0.1–0.9)
Monocytes: 10 %
Neutrophils Absolute: 4.3 10*3/uL (ref 1.4–7.0)
Neutrophils: 60 %
Platelets: 166 10*3/uL (ref 150–450)
RBC: 4.18 x10E6/uL (ref 3.77–5.28)
RDW: 12.8 % (ref 11.7–15.4)
WBC: 7.1 10*3/uL (ref 3.4–10.8)

## 2022-05-08 LAB — COMPREHENSIVE METABOLIC PANEL
ALT: 14 IU/L (ref 0–32)
AST: 20 IU/L (ref 0–40)
Albumin/Globulin Ratio: 2 (ref 1.2–2.2)
Albumin: 4.5 g/dL (ref 3.8–4.8)
Alkaline Phosphatase: 89 IU/L (ref 44–121)
BUN/Creatinine Ratio: 16 (ref 12–28)
BUN: 9 mg/dL (ref 8–27)
Bilirubin Total: 0.9 mg/dL (ref 0.0–1.2)
CO2: 26 mmol/L (ref 20–29)
Calcium: 9.9 mg/dL (ref 8.7–10.3)
Chloride: 99 mmol/L (ref 96–106)
Creatinine, Ser: 0.57 mg/dL (ref 0.57–1.00)
Globulin, Total: 2.3 g/dL (ref 1.5–4.5)
Glucose: 104 mg/dL — ABNORMAL HIGH (ref 70–99)
Potassium: 3.8 mmol/L (ref 3.5–5.2)
Sodium: 139 mmol/L (ref 134–144)
Total Protein: 6.8 g/dL (ref 6.0–8.5)
eGFR: 92 mL/min/{1.73_m2} (ref >59)

## 2022-05-08 LAB — LIPID PANEL W/O CHOL/HDL RATIO
Cholesterol, Total: 236 mg/dL — ABNORMAL HIGH (ref 100–199)
HDL: 121 mg/dL (ref >39)
LDL Chol Calc (NIH): 105 mg/dL — ABNORMAL HIGH (ref 0–99)
Triglycerides: 58 mg/dL (ref 0–149)
VLDL Cholesterol Cal: 10 mg/dL (ref 5–40)

## 2022-05-09 DIAGNOSIS — I442 Atrioventricular block, complete: Secondary | ICD-10-CM | POA: Diagnosis not present

## 2022-05-22 DIAGNOSIS — E0590 Thyrotoxicosis, unspecified without thyrotoxic crisis or storm: Secondary | ICD-10-CM | POA: Diagnosis not present

## 2022-05-29 ENCOUNTER — Other Ambulatory Visit: Payer: Self-pay | Admitting: Family Medicine

## 2022-05-29 DIAGNOSIS — E0590 Thyrotoxicosis, unspecified without thyrotoxic crisis or storm: Secondary | ICD-10-CM | POA: Diagnosis not present

## 2022-05-29 NOTE — Telephone Encounter (Signed)
Requested Prescriptions  Pending Prescriptions Disp Refills   benazepril (LOTENSIN) 20 MG tablet [Pharmacy Med Name: BENAZEPRIL HCL 20 MG TAB] 90 tablet 1    Sig: TAKE 1 TABLET BY MOUTH ONCE DAILY     Cardiovascular:  ACE Inhibitors Passed - 05/29/2022  9:31 AM      Passed - Cr in normal range and within 180 days    Creatinine  Date Value Ref Range Status  08/15/2012 0.65 0.60 - 1.30 mg/dL Final   Creatinine, Ser  Date Value Ref Range Status  05/07/2022 0.57 0.57 - 1.00 mg/dL Final         Passed - K in normal range and within 180 days    Potassium  Date Value Ref Range Status  05/07/2022 3.8 3.5 - 5.2 mmol/L Final  08/15/2012 4.3 3.5 - 5.1 mmol/L Final         Passed - Patient is not pregnant      Passed - Last BP in normal range    BP Readings from Last 1 Encounters:  05/07/22 136/80         Passed - Valid encounter within last 6 months    Recent Outpatient Visits           3 weeks ago Hypertension, unspecified type   [place], Megan P, DO   6 months ago Hypertension, unspecified type   [place], [place], DO   8 months ago [place], subsequent encounter   [place], MD   9 months ago Hypertension, unspecified type   [employer] [facility], Megan P, DO   11 months ago Hypertension, unspecified type   [place], [place], DO       Future Appointments             In 5 months Johnson, Barb Merino, DO [employer]

## 2022-06-07 NOTE — Progress Notes (Signed)
BP (!) 153/82   Pulse 77   Temp 97.9 F (36.6 C) (Oral)   Wt 190 lb 14.4 oz (86.6 kg)   SpO2 96%   BMI 33.82 kg/m    Subjective:    Patient ID: Maria Davies, female    DOB: 12-27-1940, 81 y.o.   MRN: [id number]  HPI: Maria Davies is a 81 y.o. female  Chief Complaint  Patient presents with   Foot Swelling    Pt states she has been noticing bilateral foot swelling for the past few days. 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 States the swelling has gone down some today.    FOOT SWELLING Pt states she has been noticing bilateral foot swelling for the past few days. 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 States the swelling has gone down some today.   HYPERTENSION without Chronic Kidney Disease Hypertension status: uncontrolled  Satisfied with current treatment? yes Duration of hypertension: years BP monitoring frequency:  not checking BP range:  BP medication side effects:  no Medication compliance: excellent compliance Previous BP meds:amlodipine and benazepril Aspirin: no Recurrent headaches: no Visual changes: no Palpitations: no Dyspnea: no Chest pain: no Lower extremity edema: yes- improves at night Dizzy/lightheaded: no   Relevant past medical, surgical, family and social history reviewed and updated as indicated. Interim medical history since our last visit reviewed. Allergies and medications reviewed and updated.  Review of Systems  Eyes:  Negative for visual disturbance.  Respiratory:  Negative for cough, chest tightness and shortness of breath.   Cardiovascular:  Positive for leg swelling. Negative for chest pain and palpitations.  Neurological:  Negative for dizziness and headaches.    Per HPI unless specifically indicated above     Objective:    BP (!) 153/82   Pulse 77   Temp 97.9 F (36.6 C) (Oral)   Wt 190 lb 14.4 oz (86.6 kg)   SpO2 96%   BMI 33.82 kg/m   Wt Readings from Last 3 Encounters:  06/08/22 190 lb 14.4 oz (86.6 kg)   05/07/22 192 lb 1.6 oz (87.1 kg)  04/26/22 187 lb (84.8 kg)    Physical Exam Vitals and nursing note reviewed.  Constitutional:      General: She is not in acute distress.    Appearance: Normal appearance. She is normal weight. She is not ill-appearing, toxic-appearing or diaphoretic.  HENT:     Head: Normocephalic.     Right Ear: External ear normal.     Left Ear: External ear normal.     Nose: Nose normal.     Mouth/Throat:     Mouth: Mucous membranes are moist.     Pharynx: Oropharynx is clear.  Eyes:     General:        Right eye: No discharge.        Left eye: No discharge.     Extraocular Movements: Extraocular movements intact.     Conjunctiva/sclera: Conjunctivae normal.     Pupils: Pupils are equal, round, and reactive to light.  Cardiovascular:     Rate and Rhythm: Normal rate and regular rhythm.     Heart sounds: No murmur heard. Pulmonary:     Effort: Pulmonary effort is normal. No respiratory distress.     Breath sounds: Normal breath sounds. No wheezing or rales.  Musculoskeletal:     Cervical back: Normal range of motion and neck supple.     Right lower leg: Edema present.     Left lower leg:  Edema present.  Skin:    General: Skin is warm and dry.     Capillary Refill: Capillary refill takes less than 2 seconds.  Neurological:     General: No focal deficit present.     Mental Status: She is alert and oriented to person, place, and time. Mental status is at baseline.  Psychiatric:        Mood and Affect: Mood normal.        Behavior: Behavior normal.        Thought Content: Thought content normal.        Judgment: Judgment normal.     Results for orders placed or performed in visit on 05/07/22  Microscopic Examination   Urine  Result Value Ref Range   WBC, UA 0-5 0 - 5 /hpf   RBC, Urine 0-2 0 - 2 /hpf   Epithelial Cells (non renal) 0-10 0 - 10 /hpf   Bacteria, UA None seen None seen/Few  CBC with Differential/Platelet  Result Value Ref Range    WBC 7.1 3.4 - 10.8 x10E3/uL   RBC 4.18 3.77 - 5.28 x10E6/uL   Hemoglobin 14.7 11.1 - 15.9 g/dL   Hematocrit 42.3 34.0 - 46.6 %   MCV 101 (H) 79 - 97 fL   MCH 35.2 (H) 26.6 - 33.0 pg   MCHC 34.8 31.5 - 35.7 g/dL   RDW 12.8 11.7 - 15.4 %   Platelets 166 150 - 450 x10E3/uL   Neutrophils 60 Not Estab. %   Lymphs 24 Not Estab. %   Monocytes 10 Not Estab. %   Eos 4 Not Estab. %   Basos 1 Not Estab. %   Neutrophils Absolute 4.3 1.4 - 7.0 x10E3/uL   Lymphocytes Absolute 1.7 0.7 - 3.1 x10E3/uL   Monocytes Absolute 0.7 0.1 - 0.9 x10E3/uL   EOS (ABSOLUTE) 0.3 0.0 - 0.4 x10E3/uL   Basophils Absolute 0.1 0.0 - 0.2 x10E3/uL   Immature Granulocytes 1 Not Estab. %   Immature Grans (Abs) 0.0 0.0 - 0.1 x10E3/uL  Comprehensive metabolic panel  Result Value Ref Range   Glucose 104 (H) 70 - 99 mg/dL   BUN 9 8 - 27 mg/dL   Creatinine, Ser 0.57 0.57 - 1.00 mg/dL   eGFR 92 >59 mL/min/1.73   BUN/Creatinine Ratio 16 12 - 28   Sodium 139 134 - 144 mmol/L   Potassium 3.8 3.5 - 5.2 mmol/L   Chloride 99 96 - 106 mmol/L   CO2 26 20 - 29 mmol/L   Calcium 9.9 8.7 - 10.3 mg/dL   Total Protein 6.8 6.0 - 8.5 g/dL   Albumin 4.5 3.8 - 4.8 g/dL   Globulin, Total 2.3 1.5 - 4.5 g/dL   Albumin/Globulin Ratio 2.0 1.2 - 2.2   Bilirubin Total 0.9 0.0 - 1.2 mg/dL   Alkaline Phosphatase 89 44 - 121 IU/L   AST 20 0 - 40 IU/L   ALT 14 0 - 32 IU/L  Lipid Panel w/o Chol/HDL Ratio  Result Value Ref Range   Cholesterol, Total 236 (H) 100 - 199 mg/dL   Triglycerides 58 0 - 149 mg/dL   HDL 121 >39 mg/dL   VLDL Cholesterol Cal 10 5 - 40 mg/dL   LDL Chol Calc (NIH) 105 (H) 0 - 99 mg/dL  Urinalysis, Routine w reflex microscopic  Result Value Ref Range   Specific Gravity, UA <1.005 (L) 1.005 - 1.030   pH, UA 5.5 5.0 - 7.5   Color, UA Yellow Yellow   Appearance  Ur Clear Clear   Leukocytes,UA Trace (A) Negative   Protein,UA Negative Negative/Trace   Glucose, UA Negative Negative   Ketones, UA Negative Negative   RBC,  UA Negative Negative   Bilirubin, UA Negative Negative   Urobilinogen, Ur 0.2 0.2 - 1.0 mg/dL   Nitrite, UA Negative Negative   Microscopic Examination See below:   Microalbumin, Urine Waived  Result Value Ref Range   Microalb, Ur Waived 10 0 - 19 mg/L   Creatinine, Urine Waived 50 10 - 300 mg/dL   Microalb/Creat Ratio <30 <30 mg/g  Thyroid Panel With TSH  Result Value Ref Range   TSH 1.050 0.450 - 4.500 uIU/mL   T4, Total 6.7 4.5 - 12.0 ug/dL   T3 Uptake Ratio 30 24 - 39 %   Free Thyroxine Index 2.0 1.2 - 4.9      Assessment & Plan:   Problem List Items Addressed This Visit       Cardiovascular and Mediastinum   Hypertension - Primary    Chronic. Swelling is likely related to HTN.  Will add HCTZ 12.21m.  Continue with Benzapril and Amlodipine.  Follow up in 1 month. Call sooner if concerns arise.       Relevant Medications   hydrochlorothiazide (MICROZIDE) 12.5 MG capsule     Follow up plan: Return in about 1 month (around 07/09/2022) for BP Check- with Dr. JLenna Sciara

## 2022-06-08 ENCOUNTER — Ambulatory Visit (INDEPENDENT_AMBULATORY_CARE_PROVIDER_SITE_OTHER): Payer: Medicare Other | Admitting: Nurse Practitioner

## 2022-06-08 ENCOUNTER — Encounter: Payer: Self-pay | Admitting: Nurse Practitioner

## 2022-06-08 VITALS — BP 153/82 | HR 77 | Temp 97.9°F | Wt 190.9 lb

## 2022-06-08 DIAGNOSIS — I10 Essential (primary) hypertension: Secondary | ICD-10-CM

## 2022-06-08 MED ORDER — HYDROCHLOROTHIAZIDE 12.5 MG PO CAPS: 12.5000 mg | ORAL_CAPSULE | Freq: Every day | ORAL | 0 refills | Status: DC

## 2022-06-08 NOTE — Assessment & Plan Note (Signed)
Chronic. Swelling is likely related to HTN.  Will add HCTZ 12.'5mg'$ .  Continue with Benzapril and Amlodipine.  Follow up in 1 month. Call sooner if concerns arise.

## 2022-06-19 ENCOUNTER — Encounter: Payer: Self-pay | Admitting: Oncology

## 2022-06-22 ENCOUNTER — Encounter: Payer: Self-pay | Admitting: Oncology

## 2022-06-26 ENCOUNTER — Other Ambulatory Visit: Payer: Self-pay

## 2022-06-26 ENCOUNTER — Encounter: Payer: Medicare Other | Admitting: Surgery

## 2022-06-26 ENCOUNTER — Ambulatory Visit (INDEPENDENT_AMBULATORY_CARE_PROVIDER_SITE_OTHER): Payer: Medicare Other | Admitting: Surgery

## 2022-06-26 ENCOUNTER — Encounter: Payer: Self-pay | Admitting: Surgery

## 2022-06-26 VITALS — BP 151/92 | HR 94 | Temp 98.2°F | Ht 63.0 in | Wt 191.6 lb

## 2022-06-26 DIAGNOSIS — K621 Rectal polyp: Secondary | ICD-10-CM

## 2022-06-26 DIAGNOSIS — Z09 Encounter for follow-up examination after completed treatment for conditions other than malignant neoplasm: Secondary | ICD-10-CM

## 2022-06-26 NOTE — Patient Instructions (Addendum)
Our surgery scheduler will call you within 24-48 hours to schedule your surgery. Please have the Blue surgery sheet available when speaking with her.  

## 2022-06-26 NOTE — Progress Notes (Signed)
[facility] POST-OP OFFICE VISIT  06/28/2022  HPI: Maria Davies is a 82 y.o. female  s/p transanal hemorrhoidectomy/polypectomy.  She seems to be doing well, having no pain.  No discomfort on bowel movements.  No bleeding.  No nausea, vomiting, fevers or chills.  SURGICAL PATHOLOGY  CASE: [id number]  PATIENT: Maria Davies  Surgical Pathology Report   Specimen Submitted:  A. Rectum polyp   Clinical History: Rectal polyp   DIAGNOSIS:  A. RECTUM POLYP; EXCISION:  - POLYPOID COLONIC-TYPE MUCOSA WITH SUPERFICIAL ULCERATION AND  PROLAPSE-TYPE CHANGE.  - FOCAL SQUAMOUS MUCOSA IS PRESENT.  - SEE COMMENT.  - SMALL HYPERPLASTIC POLYP.  - FOCAL DILATED SUBMUCOSAL VESSELS SUGGESTIVE OF HEMORRHOID.  - NEGATIVE FOR DYSPLASIA AND MALIGNANCY.   Comment:  The differential diagnosis for the findings includes solitary rectal  ulcer syndrome (SRUS) and inflammatory cloacogenic polyp (ICP). While  the presence of focal squamous mucosa would favor an inflammatory  cloacogenic polyp correlation with clinical impression is required.   Vital signs: BP (!) 151/92   Pulse 94   Temp 98.2 F (36.8 C) (Oral)   Ht '5\' 3"'$  (1.6 m)   Wt 191 lb 9.6 oz (86.9 kg)   SpO2 99%   BMI 33.94 kg/m    Physical Exam: Constitutional: She appears well Chest there to auscultation. Cor regular rate and rhythm. Abdomen soft nontender. Freda Munro present as chaperone: DRE completed.  The area of the prior prolapsing hemorrhoid/polyp is scarred and healing well.  No evidence of bleeding or ulceration.  In review of her pathology report not correlating perfectly with her original biopsy, I suspect it is possible I may have overlooked a subtle lesion on her left.  I feel little irregularity there in the distal rectum, that is mobile and sessile.  Otherwise there are some soft residual external hemorrhoidal tags present.  Assessment/Plan: This is a 82 y.o. female days s/p transanal hemorrhoidectomy  prolapsing hemorrhoid/anal polyp.  May still have small serrated polyp present, that may have been overlooked during her exam under anesthesia.  Return to the OR for removal/biopsy of this other area.    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   Goals of care, counseling/discussion 11/19/2019   Status post laparoscopic colectomy 11/17/2019   Colon cancer ([place]) 10/28/2019   Iron deficiency anemia secondary to blood loss (chronic)    Polyp of sigmoid colon    Benign neoplasm of cecum    Intestines neoplasm    Stricture and stenosis of esophagus    Advanced care planning/counseling discussion 07/22/2017   Hypercholesteremia 07/03/2016   Osteoporosis 07/03/2016   Carcinoma of right breast ([place]) 06/19/2016   Aortic stenosis, mild 05/02/2015   Hypertension 05/02/2015   Hyperthyroidism 05/02/2015   Breast cancer in female [facility]) 05/02/2015      Ronny Bacon M.D., FACS 06/28/2022, 2:07 PM

## 2022-06-26 NOTE — H&P (View-Only) (Signed)
[facility] POST-OP OFFICE VISIT  06/28/2022  HPI: Maria Davies is a 82 y.o. female  s/p transanal hemorrhoidectomy/polypectomy.  She seems to be doing well, having no pain.  No discomfort on bowel movements.  No bleeding.  No nausea, vomiting, fevers or chills.  SURGICAL PATHOLOGY  CASE: [id number]  PATIENT: Maria Davies  Surgical Pathology Report   Specimen Submitted:  A. Rectum polyp   Clinical History: Rectal polyp   DIAGNOSIS:  A. RECTUM POLYP; EXCISION:  - POLYPOID COLONIC-TYPE MUCOSA WITH SUPERFICIAL ULCERATION AND  PROLAPSE-TYPE CHANGE.  - FOCAL SQUAMOUS MUCOSA IS PRESENT.  - SEE COMMENT.  - SMALL HYPERPLASTIC POLYP.  - FOCAL DILATED SUBMUCOSAL VESSELS SUGGESTIVE OF HEMORRHOID.  - NEGATIVE FOR DYSPLASIA AND MALIGNANCY.   Comment:  The differential diagnosis for the findings includes solitary rectal  ulcer syndrome (SRUS) and inflammatory cloacogenic polyp (ICP). While  the presence of focal squamous mucosa would favor an inflammatory  cloacogenic polyp correlation with clinical impression is required.   Vital signs: BP (!) 151/92   Pulse 94   Temp 98.2 F (36.8 C) (Oral)   Ht '5\' 3"'$  (1.6 m)   Wt 191 lb 9.6 oz (86.9 kg)   SpO2 99%   BMI 33.94 kg/m    Physical Exam: Constitutional: She appears well Chest there to auscultation. Cor regular rate and rhythm. Abdomen soft nontender. Freda Munro present as chaperone: DRE completed.  The area of the prior prolapsing hemorrhoid/polyp is scarred and healing well.  No evidence of bleeding or ulceration.  In review of her pathology report not correlating perfectly with her original biopsy, I suspect it is possible I may have overlooked a subtle lesion on her left.  I feel little irregularity there in the distal rectum, that is mobile and sessile.  Otherwise there are some soft residual external hemorrhoidal tags present.  Assessment/Plan: This is a 82 y.o. female days s/p transanal hemorrhoidectomy  prolapsing hemorrhoid/anal polyp.  May still have small serrated polyp present, that may have been overlooked during her exam under anesthesia.  Return to the OR for removal/biopsy of this other area.    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   Goals of care, counseling/discussion 11/19/2019   Status post laparoscopic colectomy 11/17/2019   Colon cancer ([place]) 10/28/2019   Iron deficiency anemia secondary to blood loss (chronic)    Polyp of sigmoid colon    Benign neoplasm of cecum    Intestines neoplasm    Stricture and stenosis of esophagus    Advanced care planning/counseling discussion 07/22/2017   Hypercholesteremia 07/03/2016   Osteoporosis 07/03/2016   Carcinoma of right breast ([place]) 06/19/2016   Aortic stenosis, mild 05/02/2015   Hypertension 05/02/2015   Hyperthyroidism 05/02/2015   Breast cancer in female [facility]) 05/02/2015      Ronny Bacon M.D., FACS 06/28/2022, 2:07 PM

## 2022-06-27 ENCOUNTER — Telehealth: Payer: Self-pay | Admitting: Surgery

## 2022-06-27 NOTE — Telephone Encounter (Signed)
Patient calls back, she is informed of all dates regarding her surgery.   

## 2022-06-27 NOTE — Telephone Encounter (Signed)
Left message for patient to call, please inform her of the following regarding scheduled surgery with Dr. Christian Mate.    Pre-Admission date/time, and Surgery date at [facility].  Surgery Date: 07/18/22 Preadmission Testing Date: 07/09/22 (phone 8a-1p)  Also patient will need to call at [phone], between 1-3:00pm the day before surgery, to find out what time to arrive for surgery.

## 2022-06-29 ENCOUNTER — Ambulatory Visit: Payer: Self-pay | Admitting: Surgery

## 2022-06-29 DIAGNOSIS — K621 Rectal polyp: Secondary | ICD-10-CM

## 2022-07-09 ENCOUNTER — Other Ambulatory Visit: Payer: Self-pay | Admitting: Family Medicine

## 2022-07-09 ENCOUNTER — Encounter
Admission: RE | Admit: 2022-07-09 | Discharge: 2022-07-09 | Disposition: A | Discharge status: Home / Self Care | Payer: Medicare Other | Source: Ambulatory Visit | Attending: Surgery | Admitting: Surgery

## 2022-07-09 ENCOUNTER — Ambulatory Visit (INDEPENDENT_AMBULATORY_CARE_PROVIDER_SITE_OTHER): Payer: Medicare Other | Admitting: Family Medicine

## 2022-07-09 ENCOUNTER — Encounter: Payer: Self-pay | Admitting: Family Medicine

## 2022-07-09 ENCOUNTER — Encounter: Payer: Self-pay | Admitting: Oncology

## 2022-07-09 VITALS — BP 135/77 | HR 99 | Temp 98.7°F | Ht 63.0 in | Wt 195.7 lb

## 2022-07-09 DIAGNOSIS — I10 Essential (primary) hypertension: Secondary | ICD-10-CM | POA: Diagnosis not present

## 2022-07-09 HISTORY — DX: Localized edema: R60.0

## 2022-07-09 MED ORDER — HYDROCHLOROTHIAZIDE 12.5 MG PO CAPS: 12.5000 mg | ORAL_CAPSULE | Freq: Every day | ORAL | 1 refills | Status: DC

## 2022-07-09 NOTE — Patient Instructions (Signed)
Your procedure is scheduled on:07-18-22 Wednesday Report to the Registration Desk on the 1st floor of the [facility].Then proceed to the 2nd floor Surgery Desk To find out your arrival time, please call [phone] between 1PM - 3PM on:07-17-22 Tuesday If your arrival time is 6:00 am, do not arrive prior to that time as the [facility] entrance doors do not open until 6:00 am.  REMEMBER: Instructions that are not followed completely may result in serious medical risk, up to and including death; or upon the discretion of your surgeon and anesthesiologist your surgery may need to be rescheduled.  Do not eat food OR drink any liquids after midnight the night before surgery.  No gum chewing, lozengers or hard candies.  TAKE THESE MEDICATIONS THE MORNING OF SURGERY WITH A SIP OF WATER: -amLODipine (NORVASC)  -methimazole (TAPAZOLE)  -metoprolol succinate (TOPROL-XL)   Call Dr Forest Becker office today (07-09-22) to find out when you need to stop your 81 mg Aspirin  One week prior to surgery: Stop Anti-inflammatories (NSAIDS) such as Advil, Aleve, Ibuprofen, Motrin, Naproxen, Naprosyn and Aspirin based products such as Excedrin, Goodys Powder, BC Powder.You may however, continue to take Tylenol if needed for pain up until the day of surgery.  Stop ANY OVER THE COUNTER supplements/vitamins 7 days prior to surgery  No Alcohol for 24 hours before or after surgery.  No Smoking including e-cigarettes for 24 hours prior to surgery.  No chewable tobacco products for at least 6 hours prior to surgery.  No nicotine patches on the day of surgery.  Do not use any "recreational" drugs for at least a week prior to your surgery.  Please be advised that the combination of cocaine and anesthesia may have negative outcomes, up to and including death. If you test positive for cocaine, your surgery will be cancelled.  On the morning of surgery brush your teeth with toothpaste and water, you may rinse your  mouth with mouthwash if you wish. Do not swallow any toothpaste or mouthwash.  Do not wear jewelry, make-up, hairpins, clips or nail polish.  Do not wear lotions, powders, or perfumes.   Do not shave body from the neck down 48 hours prior to surgery just in case you cut yourself which could leave a site for infection.  Also, freshly shaved skin may become irritated if using the CHG soap.  Contact lenses, hearing aids and dentures may not be worn into surgery.  Do not bring valuables to the hospital. [facility] is not responsible for any missing/lost belongings or valuables.   Fleet Enema as directed: Do Fleet Enema at home the night prior to your procedure and then another Fleet Enema the day of your procedure 2 hours prior to your arrival time to the hospital (fleet enemas are in your surgery bag)   Notify your doctor if there is any change in your medical condition (cold, fever, infection).  Wear comfortable clothing (specific to your surgery type) to the hospital.  After surgery, you can help prevent lung complications by doing breathing exercises.  Take deep breaths and cough every 1-2 hours. Your doctor may order a device called an Incentive Spirometer to help you take deep breaths. When coughing or sneezing, hold a pillow firmly against your incision with both hands. This is called "splinting." Doing this helps protect your incision. It also decreases belly discomfort.  If you are being admitted to the hospital overnight, leave your suitcase in the car. After surgery it may be brought to  your room.  If you are being discharged the day of surgery, you will not be allowed to drive home. You will need a responsible adult (18 years or older) to drive you home and stay with you that night.   If you are taking public transportation, you will need to have a responsible adult (18 years or older) with you. Please confirm with your physician that it is acceptable to use public  transportation.   Please call the [facility]. at [phone] if you have any questions about these instructions.  Surgery Visitation Policy:  Patients undergoing a surgery or procedure may have two family members or support persons with them as long as the person is not COVID-19 positive or experiencing its symptoms.   Due to an increase in RSV and influenza rates and associated hospitalizations, children ages 4 and under will not be able to visit patients in [facility]. Masks continue to be strongly recommended.

## 2022-07-09 NOTE — Progress Notes (Signed)
Perioperative Services  Pre-Admission/Anesthesia Testing Clinical Review  Date: 07/11/22  Patient Demographics:  Name: Maria Davies DOB:   1940-11-06 MRN:   [id number]  Planned Surgical Procedure(s):    Case: [id number] Date/Time: 07/18/22 1050   Procedure: RECTAL EXAM UNDER ANESTHESIA   Anesthesia type: General   Pre-op diagnosis: rectal tumor, s/p excision   Location: [facility] OR ROOM 07 / [place] ORS FOR ANESTHESIA GROUP   Surgeons: Ronny Bacon, MD   NOTE: Available PAT nursing documentation and vital signs have been reviewed. Clinical nursing staff has updated patient's PMH/PSHx, current medication list, and drug allergies/intolerances to ensure comprehensive history available to assist in medical decision making as it pertains to the aforementioned surgical procedure and anticipated anesthetic course. Extensive review of available clinical information performed. [place] PMH and PSHx updated with any diagnoses/procedures that  may have been inadvertently omitted during her intake with the pre-admission testing department's nursing staff.  Clinical Discussion:  Maria Davies is a 82 y.o. female who is submitted for pre-surgical anesthesia review and clearance prior to her undergoing the above procedure. Patient has never been a smoker. Pertinent PMH includes: CAD, severe aortic valve stenosis (s/p TAVR), CHB (s/p PPM placement), diastolic dysfunction, aortic atherosclerosis, cardiac murmur, HTN, hyperthyroidism, anemia, RIGHT breast cancer, colon cancer, Guillain-Barr syndrome, OA, multilevel cervical spondylosis, cervical spinal DDD with resulting stenosis.  Patient is followed by cardiology Ubaldo Glassing, MD). She was last seen in the cardiology clinic on 02/13/2022; notes reviewed.  At the time of his clinic visit, patient doing fairly well overall from a cardiovascular perspective.  She denied any episodes of chest pain, shortness of breath, PND, orthopnea, significant peripheral  edema, palpitations, vertiginous symptoms, or presyncope/syncope.  Patient has a past medical history significant for cardiovascular diagnoses.  Patient found to have aortic valve stenosis on TTE performed on 08/17/2016.  At that time, study revealed normal left ventricular systolic function with an EF of 55%.  There was mild aortic valve stenosis with a mean pressure gradient of 29.5 mmHg; AVA (VTI) = 1.3 cm.  Since diagnosis, stenosis has been monitored EF serial noninvasive studies as follows:  TTE performed on 08/25/2018 revealed normal left trickle systolic function with an EF of >55%.  There was moderate concentric LVH. Left ventricular diastolic Doppler parameters consistent with abnormal relaxation (G1DD).  There was trivial to mild pan valvular regurgitation noted.  Degree of aortic valve stenosis had progressed to moderate with a mean pressure gradient of 42.3 mmHg; AVA (VTI) = 0.82 cm.  Repeat TTE performed on 05/30/2020 revealed a normal left trickle systolic function with an EF of >55%.  Left atrium moderately dilated.  There was trivial mitral and tricuspid valve regurgitation.  Degree of stenosis had progressed to severe with a mean pressure gradient of 47.2 mmHg; AVA (VTI) = 0.59 cm.  Diagnostic RIGHT/LEFT heart catheterization was performed on 08/11/2020.  There was no evidence of obstructive coronary artery disease.  Wedge pressure was mildly elevated.  Cardiac output and index were elevated in the context of anemia (hemoglobin 9 g/dL).  There was no evidence of intra-atrial shunting noted.  Patient ultimately underwent TAVR procedure via a RIGHT percutaneous femoral approach at [facility] on 08/31/2020.  A 23 mm Edwards SAPIEN 3 ultra bioprosthetic valve was placed.  Procedure was complicated by intra/postoperative complete heart block requiring placement of a transvenous pacemaker.  Postoperatively, when unable to discontinue transvenous pacing, electrophysiology  was consulted and the decision was made to place a permanent implanted  cardiac device.  Medtronic device, with LEFT bundle lead, was placed on 09/03/2020.  Most recent TTE was performed on 12/11/2021 revealing a left ventricular systolic function with moderate concentric LVH; LVEF >55%. Left ventricular diastolic Doppler parameters consistent with abnormal relaxation (G1DD).  There was mild mitral annular calcification.  There was mild biatrial and mild right ventricular enlargement.  Trivial to mild mitral, tricuspid, and pulmonary valve regurgitation noted.  Bioprosthetic aortic valve well-seated and noted to be functioning properly; mean transit bioprosthetic valve gradient was 30.4 mmHg; AVA (VTI) = 1.3 cm.  Blood pressure reasonably controlled at 130/70 mmHg on currently prescribed CCB (amlodipine), ACEi (benazepril), and beta-blocker (metoprolol succinate) therapies.  Patient is not currently taking any type of lipid-lowering therapies for ASCVD prevention.  Patient is not diabetic. Patient does not have an OSAH diagnosis. Functional capacity, as defined by DASI, is documented as being >/= 4 METS.  No changes were made to her medication regimen.  Patient follow-up with outpatient cardiology in 6 months or sooner if needed.  Maria Davies is scheduled for a RECTAL EXAM UNDER ANESTHESIA on 07/18/2022 with Dr. Ronny Bacon, MD.  Given patient's past medical history significant for cardiovascular diagnoses, presurgical cardiac clearance was sought by the PAT team. Per cardiology, "this patient is optimized for surgery and may proceed with the planned procedural course with an ACCEPTABLE risk of significant perioperative cardiovascular complications".  In review of her medication reconciliation, it is noted that patient is currently on prescribed daily antiplatelet therapy. Given her cardiovascular history, and the nature of her upcoming procedure, patient will continue her daily low dose ASA  throughout her perioperative course. If surgery absolutely needs her to hold antiplatelet medication, cardiology has cleared for a 5 day hold. She was asked to contact Dr. Forest Becker office to discuss.  Patient reports previous perioperative complications with anesthesia in the past. Patient has a PMH (+) for PONV. Symptoms and history of PONV will be discussed with patient by anesthesia team on the day of her procedure. Interventions will be ordered as deemed necessary based on patient's individual care needs as determined by anesthesiologist. In review of the available records, it is noted that patient underwent a general anesthetic course here at [facility] (ASA III) in 03/2022 without documented complications.      07/09/2022    9:43 AM 06/26/2022   10:35 AM 06/08/2022    8:16 AM  Vitals with BMI  Height '5\' 3"'$  '5\' 3"'$    Weight 195 lbs 11 oz 191 lbs 10 oz 190 lbs 14 oz  BMI 78.29 56.21   Systolic [phone]  Diastolic 77 92 82  Pulse 99 94 77    Providers/Specialists:   NOTE: Primary physician provider listed below. Patient may have been seen by APP or partner within same practice.   PROVIDER ROLE / SPECIALTY LAST Katy Apo, MD General Surgery (Surgeon) 06/26/2022  Valerie Roys, DO Primary Care Provider 07/09/2022  Bartholome Bill, MD Cardiology 02/13/2022  Delight Hoh, MD Medical oncology 02/20/2022  Lavone Orn, MD Endocrinology 05/29/2022   Allergies:  Tape  Current Home Medications:   No current facility-administered medications for this encounter.    amLODipine (NORVASC) 5 MG tablet   aspirin EC 81 MG tablet   benazepril (LOTENSIN) 20 MG tablet   hydrochlorothiazide (MICROZIDE) 12.5 MG capsule   ibuprofen (ADVIL) 200 MG tablet   methimazole (TAPAZOLE) 10 MG tablet   metoprolol succinate (TOPROL-XL) 50 MG 24 hr tablet  Multiple Vitamin (MULTI-VITAMINS) TABS   Probiotic Product (PROBIOTIC ADVANCED PO)   silver  sulfADIAZINE (SILVADENE) 1 % cream   Sodium Hyaluronate, oral, (HYALURONIC ACID PO)   vitamin B-12 (CYANOCOBALAMIN) 1000 MCG tablet    sodium chloride flush (NS) 0.9 % injection 10 mL   sodium chloride flush (NS) 0.9 % injection 10 mL   History:   Past Medical History:  Diagnosis Date   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   a.) stage IIIb; b.) RIGHT colon mass Bx (+) for invasive moderately differentiated adenocarcinoma (pT3 pN1a)   Anemia    Aortic atherosclerosis (HCC)    Aortic stenosis    a.) TTE 08/17/2016: EF >55%, mild AS (MPG 29.5); b.) TTE 08/25/2018: EF >55%; mod AS (MPG 42.3); c.) TTE 06/17/2020: EF >55%; severe AS (MPG 47.2); d.) s/p TAVR 08/31/2020 at [facility] - 23 mm Edwards Sapien 3 Ultra valve placed via a RIGHT percutaneous femoral approach --> complicated by intra/postoperative CHB requiring TVP and ultimate PPM placement   Arthritis    Basal cell carcinoma (BCC) of back    Bilateral leg edema    Breast cancer, right ([place]) 2011   a.) stage Ia IMC (ER/PR +, HER2/neu -); b.) lumpectomy + XRT in 2011; c.) completed 5 years AI therapy (anastrozole) in 11/2014   Cervical spinal stenosis    Cervical spondylosis    Chemotherapy induced nausea and vomiting    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/2020   a.) TVP placed intraoperatively during TAVR secondary to development of CHB; b.) Medtronic PPM placed 09/03/2020 for postoperative continued postoperative CHB   Coronary artery disease    a.) RHC 08/11/2020: no sig CAD   DDD (degenerative disc disease), cervical    Diastolic dysfunction    a.) TTE 08/17/2016: EF >55%, mild LVH, mild LAE, triv panvalvular regurg, G1DD; b.) TTE 08/25/2018: EF >55%, mod LVH, mild LAE, triv AR/TR/PR, mild MR, G1dd; c.) TTE 08/11/2020: EF >55%, mild LVHH, mod LAE, triv MR/TR   Guillain Barr syndrome ([place]) 03/2021   Heart murmur    History of bilateral cataract extraction    Hyperplastic colon polyp    Hypertension    Hyperthyroidism     Neuropathy    Osteopenia    PONV (postoperative nausea and vomiting)    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   a.) TVP placed intraoperatively during TAVR secondary to development of CHB; b.) Medtronic PPM placed 09/03/2020 for postoperative continued postoperative CHB   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   a.) s/p TAVR 08/31/2020 at [facility] - 23 mm Edwards Sapien 3 Ultra valve placed via a RIGHT percutaneous femoral approach --> complicated by intra/postoperative CHB requiring TVP and ultimate PPM placement   Thyroid nodule 01/25/2022   a.) CT C-spine 01/25/2022: 22 mm right thyroid nodule   Past Surgical History:  Procedure Laterality Date   BACK SURGERY     BREAST EXCISIONAL BIOPSY Right 03/2009   BREAST EXCISIONAL BIOPSY Bilateral    NEG   CATARACT EXTRACTION W/ INTRAOCULAR LENS  IMPLANT, BILATERAL     CHOLECYSTECTOMY     COLONOSCOPY WITH PROPOFOL N/A 10/19/2019   Procedure: COLONOSCOPY WITH PROPOFOL-attempted, unable to perform poor prep;  Surgeon: Lucilla Lame, MD;  Location: [facility];  Service: Endoscopy;  Laterality: N/A;  priority 3   COLONOSCOPY WITH PROPOFOL N/A 10/20/2019   Procedure: COLONOSCOPY WITH PROPOFOL;  Surgeon: Lucilla Lame, MD;  Location: [facility] ENDOSCOPY;  Service: Endoscopy;  Laterality: N/A;   COLONOSCOPY WITH PROPOFOL N/A 11/16/2021  Procedure: COLONOSCOPY WITH PROPOFOL;  Surgeon: Lucilla Lame, MD;  [facility] END[facility]COPY;  Service: Endoscopy;  Laterality: N/A;   ESOPHAGEAL DILATION  10/19/2019   Procedure: ESOPHAGEAL DILATION;  Surgeon: Lucilla Lame, MD;  Location: [facility];  Service: Endoscopy;;   ESOPHAGOGASTRODUODENOSCOPY (EGD) WITH PROPOFOL N/A 10/19/2019   Procedure: ESOPHAGOGASTRODUODENOSCOPY (EGD) WITH PROPOFOL;  Surgeon: Lucilla Lame, MD;  Location: [place];  Service: Endoscopy;  Laterality: N/A;   FLEXIBLE SIGMOIDOSCOPY N/A 03/20/2022   Procedure: FLEXIBLE SIGMOIDOSCOPY;  Surgeon:  Lucilla Lame, MD;  Location: [facility] ENDOSCOPY;  Service: Endoscopy;  Laterality: N/A;   HEMORRHOID SURGERY     MASTECTOMY, PARTIAL Right 2010   PACEMAKER INSERTION N/A 09/03/2020   Procedure: PACEMAKER WITH LEFT BUNDLE LEAD (MEDTRONIC); Location: [facility]; Surgeon: Teena Dunk, MD   [facility] REMOVAL  2021   PORTACATH PLACEMENT Right 11/23/2019   Procedure: INSERTION PORT-A-CATH;  Surgeon: Ronny Bacon, MD;  Location: [facility] ORS;  Service: General;  Laterality: Right;   RIGHT COLECTOMY Right 10/2019   Procedure: ROBOTIC ASSISTED TIGHT HEMICOLECTOMY   RIGHT/LEFT HEART CATH AND CORONARY ANGIOGRAPHY Right 08/11/2020   Procedure: RIGHT/LEFT HEART CATH AND CORONARY ANGIOGRAPHY; Location: [facility]; Surgeon: Jori Moll, MD   TRANSCATHETER AORTIC VALVE REPLACEMENT, TRANSFEMORAL Right 08/31/2020   Procedure: TRANSCATHETER AORTIC VALVE REPLACEMENT, RIGHT TRANSFEMORAL; Location: [facility]; Surgeon: Durward Mallard, MD   TUMOR EXCISION N/A 04/11/2022   Procedure: TUMOR EXCISION RECTAL;  Surgeon: Ronny Bacon, MD;  Location: [facility] ORS;  Service: General;  Laterality: N/A;   Family History  Problem Relation Age of Onset   Cancer Mother    Heart disease Father    Breast cancer Paternal Aunt 80   Social History   Tobacco Use   Smoking status: Never    Passive exposure: Never   Smokeless tobacco: Never  Vaping Use   Vaping Use: Never used  Substance Use Topics   Alcohol use: Yes    Alcohol/week: 2.0 standard drinks of alcohol    Types: 2 Standard drinks or equivalent per week    Comment: only on weeks not having treatment   Drug use: No    Pertinent Clinical Results:  LABS: Labs reviewed: Acceptable for surgery.  Lab Results  Component Value Date   WBC 7.1 05/07/2022   HGB 14.7 05/07/2022   HCT 42.3 05/07/2022   MCV 101 (H) 05/07/2022   PLT 166 05/07/2022   Lab Results  Component Value Date   NA 137 07/09/2022   K 4.1 07/09/2022   CO2 24 07/09/2022   GLUCOSE 103 (H) 07/09/2022   BUN 12  07/09/2022   CREATININE 0.63 07/09/2022   CALCIUM 10.1 07/09/2022   EGFR 90 07/09/2022   GFRNONAA >60 04/11/2022    ECG: Date: 11/21/2021 Time ECG obtained: 1122 AM Rate: 85 bpm Rhythm:  Atrial sensed ventricular paced rhythm Axis (leads I and aVF): Normal Intervals: PR 178 ms. QRS 172 ms. QTc 509 ms. ST segment and T wave changes: No evidence of acute ST segment elevation or depression Comparison: Similar to previous tracing obtained on 03/24/2021 NOTE: Tracing obtained at [facility]; unable for review. Above based on cardiologist's interpretation.    IMAGING / PROCEDURES: CT ABDOMEN PELVIS W CONTRAST performed on 02/12/2022 Stable exam with no evidence of recurrent metastatic carcinoma within the abdomen or pelvis. Colonic diverticulosis without radiographic evidence of diverticulitis Aortic atherosclerosis  TRANSTHORACIC ECHOCARDIOGRAM performed on 12/11/2021 Normal left ventricular systolic function with an EF of greater than 55% Moderate concentric LVH Left ventricular diastolic Doppler parameters consistent with  abnormal relaxation (G1DD). Moderate mitral annular calcification Mild biatrial enlargement Mild right ventricular enlargement Normal right ventricular systolic function Bioprosthetic aortic valve well-seated with normal function; mean transvalvular gradient 30.4 millimeters of mercury Trivial TR and PR Mild MR No AR No pericardial effusion  RIGHT/LEFT HEART CATHETERIZATION AND CORONARY ANGIOGRAPHY performed on 08/11/2020 Mild elevation of the wedge pressure Elevated cardiac output/index in the context of anemia (hemoglobin 9 g/dL) No cardiac shunt on full oximetry shunt run  No significant obstructive coronary artery disease    Impression and Plan:  Maria Davies has been referred for pre-anesthesia review and clearance prior to her undergoing the planned anesthetic and procedural courses. Available labs, pertinent testing, and imaging results were  personally reviewed by me. This patient has been appropriately cleared by cardiology with an overall ACCEPTABLE risk of significant perioperative cardiovascular complications. Completed perioperative prescription for cardiac device management documentation completed by primary cardiology team and placed on patient's chart for review by the surgical/anesthetic team on the day of her procedure. Completed perioperative prescription for cardiac device management documentation completed by primary cardiology team and placed on patient's chart for review by the surgical/anesthetic team on the day of her procedure. Electrophysiology indicating that procedure should not interfere with planned surgical procedure. Beyond normal perioperative cardiovascular monitoring, there are no recommendations from electrophysiology team that prompt further discussion/recommendations from industry representative.   Based on clinical review performed today (07/11/22), barring any significant acute changes in the patient's overall condition, it is anticipated that she will be able to proceed with the planned surgical intervention. Any acute changes in clinical condition may necessitate her procedure being postponed and/or cancelled. Patient will meet with anesthesia team (MD and/or CRNA) on the day of her procedure for preoperative evaluation/assessment. Questions regarding anesthetic course will be fielded at that time.   Pre-surgical instructions were reviewed with the patient during her PAT appointment and questions were fielded by PAT clinical staff. Patient was advised that if any questions or concerns arise prior to her procedure then she should return a call to PAT and/or her surgeon's office to discuss.  Honor Loh, MSN, APRN, FNP-C, CEN [facility]  Peri-operative Services Nurse Practitioner Phone: [phone] Fax: [phone] 07/11/22 4:24 PM  NOTE: This note has been prepared using Dragon  dictation software. Despite my best ability to proofread, there is always the potential that unintentional transcriptional errors may still occur from this process.

## 2022-07-09 NOTE — Assessment & Plan Note (Signed)
Under good control on current regimen. Continue current regimen. Continue to monitor. Call with any concerns. Refills given. Labs drawn today.

## 2022-07-09 NOTE — Progress Notes (Signed)
BP 135/77   Pulse 99   Temp 98.7 F (37.1 C) (Oral)   Ht '5\' 3"'$  (1.6 m)   Wt 195 lb 11.2 oz (88.8 kg)   SpO2 99%   BMI 34.67 kg/m    Subjective:    Patient ID: Maria Davies, female    DOB: 17-May-1941, 82 y.o.   MRN: [id number]  HPI: Maria Davies is a 82 y.o. female  Chief Complaint  Patient presents with   Hypertension    Patient says her feet with the swelling has gotten a better, but still has a little bit.    HYPERTENSION  Hypertension status: better  Satisfied with current treatment? yes Duration of hypertension: chronic BP monitoring frequency:  not checking BP medication side effects:  no Medication compliance: excellent compliance Previous BP meds:benazepril, metoprolol, amlodipine, HCTZ Aspirin: yes Recurrent headaches: no Visual changes: no Palpitations: no Dyspnea: no Chest pain: no Lower extremity edema: no Dizzy/lightheaded: no  Relevant past medical, surgical, family and social history reviewed and updated as indicated. Interim medical history since our last visit reviewed. Allergies and medications reviewed and updated.  Review of Systems  Constitutional: Negative.   Respiratory: Negative.    Cardiovascular: Negative.   Gastrointestinal: Negative.   Musculoskeletal: Negative.   Neurological: Negative.   Psychiatric/Behavioral: Negative.      Per HPI unless specifically indicated above     Objective:    BP 135/77   Pulse 99   Temp 98.7 F (37.1 C) (Oral)   Ht '5\' 3"'$  (1.6 m)   Wt 195 lb 11.2 oz (88.8 kg)   SpO2 99%   BMI 34.67 kg/m   Wt Readings from Last 3 Encounters:  07/09/22 195 lb 11.2 oz (88.8 kg)  06/26/22 191 lb 9.6 oz (86.9 kg)  06/08/22 190 lb 14.4 oz (86.6 kg)    Physical Exam Vitals and nursing note reviewed.  Constitutional:      General: She is not in acute distress.    Appearance: Normal appearance. She is not ill-appearing, toxic-appearing or diaphoretic.  HENT:     Head: Normocephalic and atraumatic.      Right Ear: External ear normal.     Left Ear: External ear normal.     Nose: Nose normal.     Mouth/Throat:     Mouth: Mucous membranes are moist.     Pharynx: Oropharynx is clear.  Eyes:     General: No scleral icterus.       Right eye: No discharge.        Left eye: No discharge.     Extraocular Movements: Extraocular movements intact.     Conjunctiva/sclera: Conjunctivae normal.     Pupils: Pupils are equal, round, and reactive to light.  Cardiovascular:     Rate and Rhythm: Normal rate and regular rhythm.     Pulses: Normal pulses.     Heart sounds: Normal heart sounds. No murmur heard.    No friction rub. No gallop.  Pulmonary:     Effort: Pulmonary effort is normal. No respiratory distress.     Breath sounds: Normal breath sounds. No stridor. No wheezing, rhonchi or rales.  Chest:     Chest wall: No tenderness.  Musculoskeletal:        General: Normal range of motion.     Cervical back: Normal range of motion and neck supple.  Skin:    General: Skin is warm and dry.     Capillary Refill: Capillary refill takes less than  2 seconds.     Coloration: Skin is not jaundiced or pale.     Findings: No bruising, erythema, lesion or rash.  Neurological:     General: No focal deficit present.     Mental Status: She is alert and oriented to person, place, and time. Mental status is at baseline.  Psychiatric:        Mood and Affect: Mood normal.        Behavior: Behavior normal.        Thought Content: Thought content normal.        Judgment: Judgment normal.     Results for orders placed or performed in visit on 05/07/22  Microscopic Examination   Urine  Result Value Ref Range   WBC, UA 0-5 0 - 5 /hpf   RBC, Urine 0-2 0 - 2 /hpf   Epithelial Cells (non renal) 0-10 0 - 10 /hpf   Bacteria, UA None seen None seen/Few  CBC with Differential/Platelet  Result Value Ref Range   WBC 7.1 3.4 - 10.8 x10E3/uL   RBC 4.18 3.77 - 5.28 x10E6/uL   Hemoglobin 14.7 11.1 - 15.9 g/dL    Hematocrit 42.3 34.0 - 46.6 %   MCV 101 (H) 79 - 97 fL   MCH 35.2 (H) 26.6 - 33.0 pg   MCHC 34.8 31.5 - 35.7 g/dL   RDW 12.8 11.7 - 15.4 %   Platelets 166 150 - 450 x10E3/uL   Neutrophils 60 Not Estab. %   Lymphs 24 Not Estab. %   Monocytes 10 Not Estab. %   Eos 4 Not Estab. %   Basos 1 Not Estab. %   Neutrophils Absolute 4.3 1.4 - 7.0 x10E3/uL   Lymphocytes Absolute 1.7 0.7 - 3.1 x10E3/uL   Monocytes Absolute 0.7 0.1 - 0.9 x10E3/uL   EOS (ABSOLUTE) 0.3 0.0 - 0.4 x10E3/uL   Basophils Absolute 0.1 0.0 - 0.2 x10E3/uL   Immature Granulocytes 1 Not Estab. %   Immature Grans (Abs) 0.0 0.0 - 0.1 x10E3/uL  Comprehensive metabolic panel  Result Value Ref Range   Glucose 104 (H) 70 - 99 mg/dL   BUN 9 8 - 27 mg/dL   Creatinine, Ser 0.57 0.57 - 1.00 mg/dL   eGFR 92 >59 mL/min/1.73   BUN/Creatinine Ratio 16 12 - 28   Sodium 139 134 - 144 mmol/L   Potassium 3.8 3.5 - 5.2 mmol/L   Chloride 99 96 - 106 mmol/L   CO2 26 20 - 29 mmol/L   Calcium 9.9 8.7 - 10.3 mg/dL   Total Protein 6.8 6.0 - 8.5 g/dL   Albumin 4.5 3.8 - 4.8 g/dL   Globulin, Total 2.3 1.5 - 4.5 g/dL   Albumin/Globulin Ratio 2.0 1.2 - 2.2   Bilirubin Total 0.9 0.0 - 1.2 mg/dL   Alkaline Phosphatase 89 44 - 121 IU/L   AST 20 0 - 40 IU/L   ALT 14 0 - 32 IU/L  Lipid Panel w/o Chol/HDL Ratio  Result Value Ref Range   Cholesterol, Total 236 (H) 100 - 199 mg/dL   Triglycerides 58 0 - 149 mg/dL   HDL 121 >39 mg/dL   VLDL Cholesterol Cal 10 5 - 40 mg/dL   LDL Chol Calc (NIH) 105 (H) 0 - 99 mg/dL  Urinalysis, Routine w reflex microscopic  Result Value Ref Range   Specific Gravity, UA <1.005 (L) 1.005 - 1.030   pH, UA 5.5 5.0 - 7.5   Color, UA Yellow Yellow   Appearance Ur Clear  Clear   Leukocytes,UA Trace (A) Negative   Protein,UA Negative Negative/Trace   Glucose, UA Negative Negative   Ketones, UA Negative Negative   RBC, UA Negative Negative   Bilirubin, UA Negative Negative   Urobilinogen, Ur 0.2 0.2 - 1.0 mg/dL    Nitrite, UA Negative Negative   Microscopic Examination See below:   Microalbumin, Urine Waived  Result Value Ref Range   Microalb, Ur Waived 10 0 - 19 mg/L   Creatinine, Urine Waived 50 10 - 300 mg/dL   Microalb/Creat Ratio <30 <30 mg/g  Thyroid Panel With TSH  Result Value Ref Range   TSH 1.050 0.450 - 4.500 uIU/mL   T4, Total 6.7 4.5 - 12.0 ug/dL   T3 Uptake Ratio 30 24 - 39 %   Free Thyroxine Index 2.0 1.2 - 4.9      Assessment & Plan:   Problem List Items Addressed This Visit       Cardiovascular and Mediastinum   Hypertension - Primary    Under good control on current regimen. Continue current regimen. Continue to monitor. Call with any concerns. Refills given. Labs drawn today.       Relevant Medications   hydrochlorothiazide (MICROZIDE) 12.5 MG capsule   Other Relevant Orders   Basic metabolic panel     Follow up plan: Return as scheduled.

## 2022-07-10 ENCOUNTER — Encounter: Payer: Self-pay | Admitting: Family Medicine

## 2022-07-10 LAB — BASIC METABOLIC PANEL
BUN/Creatinine Ratio: 19 (ref 12–28)
BUN: 12 mg/dL (ref 8–27)
CO2: 24 mmol/L (ref 20–29)
Calcium: 10.1 mg/dL (ref 8.7–10.3)
Chloride: 95 mmol/L — ABNORMAL LOW (ref 96–106)
Creatinine, Ser: 0.63 mg/dL (ref 0.57–1.00)
Glucose: 103 mg/dL — ABNORMAL HIGH (ref 70–99)
Potassium: 4.1 mmol/L (ref 3.5–5.2)
Sodium: 137 mmol/L (ref 134–144)
eGFR: 90 mL/min/{1.73_m2} (ref >59)

## 2022-07-10 NOTE — Telephone Encounter (Signed)
Requested Prescriptions  Pending Prescriptions Disp Refills   amLODipine (NORVASC) 5 MG tablet [Pharmacy Med Name: AMLODIPINE BESYLATE 5 MG TAB] 90 tablet 0    Sig: Take 1 tablet (5 mg total) by mouth every morning.     Cardiovascular: Calcium Channel Blockers 2 Passed - 07/09/2022 12:27 PM      Passed - Last BP in normal range    BP Readings from Last 1 Encounters:  07/09/22 135/77         Passed - Last Heart Rate in normal range    Pulse Readings from Last 1 Encounters:  07/09/22 99         Passed - Valid encounter within last 6 months    Recent Outpatient Visits           Yesterday Primary hypertension   [place], [place], DO   1 month ago Hypertension, unspecified type   [place], NP   2 months ago Hypertension, unspecified type   [place], Megan P, DO   8 months ago Hypertension, unspecified type   [place], Megan P, DO   10 months ago Fall, subsequent encounter   [place] Charlynne Cousins, MD       Future Appointments             In 3 months Wynetta Emery, Barb Merino, DO [place]

## 2022-07-11 ENCOUNTER — Encounter: Payer: Self-pay | Admitting: Oncology

## 2022-07-11 ENCOUNTER — Encounter: Payer: Self-pay | Admitting: Surgery

## 2022-07-16 ENCOUNTER — Ambulatory Visit
Admission: RE | Admit: 2022-07-16 | Discharge: 2022-07-16 | Disposition: A | Discharge status: Home / Self Care | Payer: Medicare Other | Source: Ambulatory Visit | Attending: Family Medicine | Admitting: Family Medicine

## 2022-07-16 DIAGNOSIS — Z780 Asymptomatic menopausal state: Secondary | ICD-10-CM | POA: Diagnosis not present

## 2022-07-16 DIAGNOSIS — M810 Age-related osteoporosis without current pathological fracture: Secondary | ICD-10-CM | POA: Diagnosis not present

## 2022-07-18 ENCOUNTER — Encounter
Admission: RE | Disposition: A | Discharge status: Home / Self Care | Payer: Self-pay | Source: Home / Self Care | Attending: Surgery

## 2022-07-18 ENCOUNTER — Ambulatory Visit: Payer: Medicare Other | Admitting: Urgent Care

## 2022-07-18 ENCOUNTER — Other Ambulatory Visit: Payer: Self-pay

## 2022-07-18 ENCOUNTER — Encounter: Payer: Self-pay | Admitting: Surgery

## 2022-07-18 ENCOUNTER — Ambulatory Visit
Admission: RE | Admit: 2022-07-18 | Discharge: 2022-07-18 | Disposition: A | Discharge status: Home / Self Care | Payer: Medicare Other | Attending: Surgery | Admitting: Surgery

## 2022-07-18 DIAGNOSIS — Z952 Presence of prosthetic heart valve: Secondary | ICD-10-CM | POA: Insufficient documentation

## 2022-07-18 DIAGNOSIS — C20 Malignant neoplasm of rectum: Secondary | ICD-10-CM | POA: Diagnosis not present

## 2022-07-18 DIAGNOSIS — Z950 Presence of cardiac pacemaker: Secondary | ICD-10-CM | POA: Insufficient documentation

## 2022-07-18 DIAGNOSIS — Z09 Encounter for follow-up examination after completed treatment for conditions other than malignant neoplasm: Secondary | ICD-10-CM | POA: Diagnosis not present

## 2022-07-18 DIAGNOSIS — I499 Cardiac arrhythmia, unspecified: Secondary | ICD-10-CM | POA: Diagnosis not present

## 2022-07-18 DIAGNOSIS — E7800 Pure hypercholesterolemia, unspecified: Secondary | ICD-10-CM | POA: Insufficient documentation

## 2022-07-18 DIAGNOSIS — Z86010 Personal history of colonic polyps: Secondary | ICD-10-CM | POA: Insufficient documentation

## 2022-07-18 DIAGNOSIS — I10 Essential (primary) hypertension: Secondary | ICD-10-CM | POA: Diagnosis not present

## 2022-07-18 DIAGNOSIS — I2510 Atherosclerotic heart disease of native coronary artery without angina pectoris: Secondary | ICD-10-CM | POA: Diagnosis not present

## 2022-07-18 DIAGNOSIS — Z8719 Personal history of other diseases of the digestive system: Secondary | ICD-10-CM | POA: Insufficient documentation

## 2022-07-18 DIAGNOSIS — K621 Rectal polyp: Secondary | ICD-10-CM

## 2022-07-18 DIAGNOSIS — D128 Benign neoplasm of rectum: Secondary | ICD-10-CM | POA: Diagnosis present

## 2022-07-18 HISTORY — PX: RECTAL EXAM UNDER ANESTHESIA: SHX6399

## 2022-07-18 HISTORY — DX: Polyp of colon: K63.5

## 2022-07-18 SURGERY — EXAM UNDER ANESTHESIA, RECTUM
Anesthesia: General | Site: Rectum

## 2022-07-18 MED ORDER — ACETAMINOPHEN 500 MG PO TABS
ORAL_TABLET | ORAL | Status: AC
  Administered 2022-07-18: 1000 mg via ORAL
  Filled 2022-07-18: qty 2

## 2022-07-18 MED ORDER — CHLORHEXIDINE GLUCONATE 0.12 % MT SOLN: 15.0000 mL | Freq: Once | OROMUCOSAL | Status: AC

## 2022-07-18 MED ORDER — CHLORHEXIDINE GLUCONATE CLOTH 2 % EX PADS: 6.0000 | MEDICATED_PAD | Freq: Once | CUTANEOUS | Status: DC

## 2022-07-18 MED ORDER — BUPIVACAINE LIPOSOME 1.3 % IJ SUSP
INTRAMUSCULAR | Status: AC
  Filled 2022-07-18: qty 20

## 2022-07-18 MED ORDER — FENTANYL CITRATE (PF) 100 MCG/2ML IJ SOLN
INTRAMUSCULAR | Status: AC
  Filled 2022-07-18: qty 2

## 2022-07-18 MED ORDER — CELECOXIB 200 MG PO CAPS: 200.0000 mg | ORAL_CAPSULE | ORAL | Status: AC

## 2022-07-18 MED ORDER — FENTANYL CITRATE (PF) 100 MCG/2ML IJ SOLN
INTRAMUSCULAR | Status: DC | PRN
  Administered 2022-07-18: 25 ug via INTRAVENOUS

## 2022-07-18 MED ORDER — BUPIVACAINE LIPOSOME 1.3 % IJ SUSP: 20.0000 mL | Freq: Once | INTRAMUSCULAR | Status: DC

## 2022-07-18 MED ORDER — ACETAMINOPHEN 500 MG PO TABS: 1000.0000 mg | ORAL_TABLET | ORAL | Status: AC

## 2022-07-18 MED ORDER — FLEET ENEMA 7-19 GM/118ML RE ENEM
1.0000 | ENEMA | Freq: Once | RECTAL | Status: AC
  Administered 2022-07-18: 1 via RECTAL

## 2022-07-18 MED ORDER — DIBUCAINE (PERIANAL) 1 % EX OINT
TOPICAL_OINTMENT | CUTANEOUS | Status: AC
  Filled 2022-07-18: qty 28

## 2022-07-18 MED ORDER — GABAPENTIN 300 MG PO CAPS
ORAL_CAPSULE | ORAL | Status: AC
  Administered 2022-07-18: 300 mg via ORAL
  Filled 2022-07-18: qty 1

## 2022-07-18 MED ORDER — ORAL CARE MOUTH RINSE: 15.0000 mL | Freq: Once | OROMUCOSAL | Status: AC

## 2022-07-18 MED ORDER — FAMOTIDINE 20 MG PO TABS: 20.0000 mg | ORAL_TABLET | Freq: Once | ORAL | Status: AC

## 2022-07-18 MED ORDER — MIDAZOLAM HCL 2 MG/2ML IJ SOLN
INTRAMUSCULAR | Status: AC
  Filled 2022-07-18: qty 2

## 2022-07-18 MED ORDER — FLEET ENEMA 7-19 GM/118ML RE ENEM: 1.0000 | ENEMA | Freq: Once | RECTAL | Status: DC

## 2022-07-18 MED ORDER — MIDAZOLAM HCL 2 MG/2ML IJ SOLN
INTRAMUSCULAR | Status: DC | PRN
  Administered 2022-07-18 (×2): 1 mg via INTRAVENOUS

## 2022-07-18 MED ORDER — ONDANSETRON HCL 4 MG/2ML IJ SOLN
INTRAMUSCULAR | Status: DC | PRN
  Administered 2022-07-18: 4 mg via INTRAVENOUS

## 2022-07-18 MED ORDER — FAMOTIDINE 20 MG PO TABS
ORAL_TABLET | ORAL | Status: AC
  Administered 2022-07-18: 20 mg via ORAL
  Filled 2022-07-18: qty 1

## 2022-07-18 MED ORDER — CELECOXIB 200 MG PO CAPS
ORAL_CAPSULE | ORAL | Status: AC
  Administered 2022-07-18: 200 mg via ORAL
  Filled 2022-07-18: qty 1

## 2022-07-18 MED ORDER — EPINEPHRINE PF 1 MG/ML IJ SOLN
INTRAMUSCULAR | Status: AC
  Filled 2022-07-18: qty 1

## 2022-07-18 MED ORDER — CHLORHEXIDINE GLUCONATE 0.12 % MT SOLN
OROMUCOSAL | Status: AC
  Administered 2022-07-18: 15 mL via OROMUCOSAL
  Filled 2022-07-18: qty 15

## 2022-07-18 MED ORDER — GABAPENTIN 300 MG PO CAPS: 300.0000 mg | ORAL_CAPSULE | ORAL | Status: AC

## 2022-07-18 MED ORDER — PROPOFOL 10 MG/ML IV BOLUS
INTRAVENOUS | Status: DC | PRN
  Administered 2022-07-18: 100 mg via INTRAVENOUS

## 2022-07-18 MED ORDER — PROPOFOL 1000 MG/100ML IV EMUL
INTRAVENOUS | Status: AC
  Filled 2022-07-18: qty 100

## 2022-07-18 MED ORDER — PROPOFOL 500 MG/50ML IV EMUL
INTRAVENOUS | Status: DC | PRN
  Administered 2022-07-18: 150 ug/kg/min via INTRAVENOUS

## 2022-07-18 MED ORDER — GELATIN ABSORBABLE 12-7 MM EX MISC
CUTANEOUS | Status: AC
  Filled 2022-07-18: qty 1

## 2022-07-18 MED ORDER — LACTATED RINGERS IV SOLN: INTRAVENOUS | Status: DC

## 2022-07-18 MED ORDER — GLYCOPYRROLATE 0.2 MG/ML IJ SOLN
INTRAMUSCULAR | Status: DC | PRN
  Administered 2022-07-18: .2 mg via INTRAVENOUS

## 2022-07-18 MED ORDER — BUPIVACAINE HCL (PF) 0.25 % IJ SOLN
INTRAMUSCULAR | Status: AC
  Filled 2022-07-18: qty 30

## 2022-07-18 SURGICAL SUPPLY — 26 items
BLADE SURG 15 STRL LF DISP TIS (BLADE) ×1 IMPLANT
BLADE SURG 15 STRL SS (BLADE) ×1
BRIEF MESH DISP 2XL (UNDERPADS AND DIAPERS) ×1 IMPLANT
DRAPE UNDER BUTTOCK W/FLU (DRAPES) IMPLANT
DRSG GAUZE FLUFF 36X18 (GAUZE/BANDAGES/DRESSINGS) ×1 IMPLANT
ELECT CAUTERY BLADE TIP 2.5 (TIP) ×1
ELECT REM PT RETURN 9FT ADLT (ELECTROSURGICAL) ×1
ELECTRODE CAUTERY BLDE TIP 2.5 (TIP) ×1 IMPLANT
ELECTRODE REM PT RTRN 9FT ADLT (ELECTROSURGICAL) ×1 IMPLANT
GLOVE ORTHO TXT STRL SZ7.5 (GLOVE) ×1 IMPLANT
GOWN STRL REUS W/ TWL LRG LVL3 (GOWN DISPOSABLE) ×1 IMPLANT
GOWN STRL REUS W/ TWL XL LVL3 (GOWN DISPOSABLE) ×1 IMPLANT
GOWN STRL REUS W/TWL LRG LVL3 (GOWN DISPOSABLE) ×1
GOWN STRL REUS W/TWL XL LVL3 (GOWN DISPOSABLE) ×1
KIT TURNOVER KIT A (KITS) ×1 IMPLANT
LEGGING LITHOTOMY PAIR STRL (DRAPES) IMPLANT
MANIFOLD NEPTUNE II (INSTRUMENTS) ×1 IMPLANT
NEEDLE HYPO 22GX1.5 SAFETY (NEEDLE) ×1 IMPLANT
PACK BASIN MINOR ARMC (MISCELLANEOUS) ×1 IMPLANT
SOL PREP PVP 2OZ (MISCELLANEOUS) ×1
SOLUTION PREP PVP 2OZ (MISCELLANEOUS) ×1 IMPLANT
SURGILUBE 2OZ TUBE FLIPTOP (MISCELLANEOUS) ×1 IMPLANT
SYR 10ML LL (SYRINGE) ×1 IMPLANT
TAPE CLOTH 3X10 WHT NS LF (GAUZE/BANDAGES/DRESSINGS) ×2 IMPLANT
TRAP FLUID SMOKE EVACUATOR (MISCELLANEOUS) ×1 IMPLANT
WATER STERILE IRR 500ML POUR (IV SOLUTION) ×1 IMPLANT

## 2022-07-18 NOTE — Op Note (Signed)
Rectal exam under anesthesia  Pre-operative Diagnosis: Benign rectal polyp.   Post-operative Diagnosis: same.   Surgeon: Ronny Bacon, M.D., FACS  Anesthesia: MAC   Findings: no appreciable distal rectal pathology.   Estimated Blood Loss: 0 mL         Specimens: none          Complications: none              Procedure Details  The patient was seen again in the Holding Room. The benefits, complications, treatment options, and expected outcomes were discussed with the patient. The risks of bleeding, infection, recurrence of symptoms, failure to resolve symptoms, unanticipated injury, prosthetic placement, prosthetic infection, any of which could require further surgery were reviewed with the patient. The likelihood of improving the patient's symptoms with return to their baseline status is good.  The patient and/or family concurred with the proposed plan, giving informed consent.  The patient was taken to Operating Room, identified and the procedure verified.    Prior to the induction of general anesthesia, antibiotic prophylaxis was administered. VTE prophylaxis was in place. MAC was then administered and tolerated well. After the induction, the patient was positioned in the lithotomy position and the perineal region and peri-anal area were prepped with betadine and draped in the sterile fashion.  A Time Out was held and the above information confirmed.  Digital exam completed, and repeat anoscopic exams done, no distal mucosal abnormality appreciable.  Prior polypoid lesion/hemorrhoid site on the pt's right well healed.   PLAN:  continue clinical exams q3-6 mos.       Ronny Bacon M.D., [facility] 07/18/2022 2:03 PM

## 2022-07-18 NOTE — Interval H&P Note (Signed)
History and Physical Interval Note:  07/18/2022 12:41 PM  Maria Davies  has presented today for surgery, with the diagnosis of rectal tumor, s/p excision.  The various methods of treatment have been discussed with the patient and family. After consideration of risks, benefits and other options for treatment, the patient has consented to  Procedure(s): RECTAL EXAM UNDER ANESTHESIA (N/A) as a surgical intervention.  The patient's history has been reviewed, patient examined, no change in status, stable for surgery.  I have reviewed the patient's chart and labs.  Questions were answered to the patient's satisfaction.     Ronny Bacon

## 2022-07-18 NOTE — Discharge Instructions (Signed)
AMBULATORY SURGERY  ?DISCHARGE INSTRUCTIONS ? ? ?The drugs that you were given will stay in your system until tomorrow so for the next 24 hours you should not: ? ?Drive an automobile ?Make any legal decisions ?Drink any alcoholic beverage ? ? ?You may resume regular meals tomorrow.  Today it is better to start with liquids and gradually work up to solid foods. ? ?You may eat anything you prefer, but it is better to start with liquids, then soup and crackers, and gradually work up to solid foods. ? ? ?Please notify your doctor immediately if you have any unusual bleeding, trouble breathing, redness and pain at the surgery site, drainage, fever, or pain not relieved by medication. ? ? ? ?Additional Instructions: ? ? ? ?Please contact your physician with any problems or Same Day Surgery at [phone], Monday through Friday 6 am to 4 pm, or [place] [facility] [place] Main number at [phone].  ?

## 2022-07-18 NOTE — Anesthesia Postprocedure Evaluation (Signed)
Anesthesia Post Note  Patient: Maria Davies  Procedure(s) Performed: RECTAL EXAM UNDER ANESTHESIA (Rectum)  Patient location during evaluation: PACU Anesthesia Type: General Level of consciousness: awake and alert Pain management: pain level controlled Vital Signs Assessment: post-procedure vital signs reviewed and stable Respiratory status: spontaneous breathing, nonlabored ventilation, respiratory function stable and patient connected to nasal cannula oxygen Cardiovascular status: blood pressure returned to baseline and stable Postop Assessment: no apparent nausea or vomiting Anesthetic complications: no  No notable events documented.   Last Vitals:  Vitals:   07/18/22 1415 07/18/22 1435  BP: 135/78 (!) 157/101  Pulse: 94 (!) 101  Resp: 19 20  Temp:  36.9 C  SpO2: 98% 95%    Last Pain:  Vitals:   07/18/22 1435  TempSrc: Temporal  PainSc: 0-No pain                 Dimas Millin

## 2022-07-18 NOTE — Anesthesia Preprocedure Evaluation (Signed)
Anesthesia Evaluation  Patient identified by MRN, date of birth, ID band Patient awake    Reviewed: Allergy & Precautions, NPO status , Patient's Chart, lab work & pertinent test results  History of Anesthesia Complications Negative for: history of anesthetic complications  Airway Mallampati: III  TM Distance: >3 FB Neck ROM: full    Dental  (+) Chipped, Dental Advidsory Given   Pulmonary neg pulmonary ROS, neg sleep apnea, neg COPD, Patient abstained from smoking.Not current smoker   Pulmonary exam normal breath sounds clear to auscultation       Cardiovascular Exercise Tolerance: Good METShypertension, + CAD  (-) Past MI Normal cardiovascular exam+ dysrhythmias + pacemaker + Valvular Problems/Murmurs  Rhythm:Regular Rate:Normal - Systolic murmurs ? TTE performed on 08/25/2018 revealed normal left trickle systolic function with an EF of >55%.  There was moderate concentric LVH. Left ventricular diastolic Doppler parameters consistent with abnormal relaxation (G1DD).  There was trivial to mild pan valvular regurgitation noted.  Degree of aortic valve stenosis had progressed to moderate with a mean pressure gradient of 42.3 mmHg; AVA (VTI) = 0.82 cm.  ? Repeat TTE performed on 05/30/2020 revealed a normal left trickle systolic function with an EF of >55%.  Left atrium moderately dilated.  There was trivial mitral and tricuspid valve regurgitation.  Degree of stenosis had progressed to severe with a mean pressure gradient of 47.2 mmHg; AVA (VTI) = 0.59 cm.  ? Diagnostic RIGHT/LEFT heart catheterization was performed on 08/11/2020.  There was no evidence of obstructive coronary artery disease.  Wedge pressure was mildly elevated.  Cardiac output and index were elevated in the context of anemia (hemoglobin 9 g/dL).  There was no evidence of intra-atrial shunting noted.  ? Patient ultimately underwent TAVR procedure via a RIGHT  percutaneous femoral approach at [facility] on 08/31/2020.  A 23 mm Edwards SAPIEN 3 ultra bioprosthetic valve was placed.  Procedure was complicated by intra/postoperative complete heart block requiring placement of a transvenous pacemaker.  Postoperatively, when unable to discontinue transvenous pacing, electrophysiology was consulted and the decision was made to place a permanent implanted cardiac device.  Medtronic device, with LEFT bundle lead, was placed on 09/03/2020.  ? Most recent TTE was performed on 12/11/2021 revealing a left ventricular systolic function with moderate concentric LVH; LVEF >55%. Left ventricular diastolic Doppler parameters consistent with abnormal relaxation (G1DD).  There was mild mitral annular calcification.  There was mild biatrial and mild right ventricular enlargement.  Trivial to mild mitral, tricuspid, and pulmonary valve regurgitation noted.  Bioprosthetic aortic valve well-seated and noted to be functioning properly; mean transit bioprosthetic valve gradient was 30.4 mmHg; AVA (VTI) = 1.3 cm.   Neuro/Psych negative neurological ROS  negative psych ROS   GI/Hepatic negative GI ROS, Neg liver ROS,neg GERD  ,,  Endo/Other  negative endocrine ROSneg diabetes    Renal/GU negative Renal ROS  negative genitourinary   Musculoskeletal   Abdominal   Peds  Hematology negative hematology ROS (+)   Anesthesia Other Findings Past Medical History: 10/20/2019: Adenocarcinoma of colon ([place])     Comment:  a.) stage IIIb; b.) RIGHT colon mass Bx (+) for invasive              moderately differentiated adenocarcinoma (pT3 pN1a) No date: Anemia No date: Aortic atherosclerosis ([place]) No date: Aortic stenosis     Comment:  a.) TTE 08/17/2016: EF >55%, mild AS (MPG 29.5); b.) TTE  08/25/2018: EF >55%; mod AS (MPG 42.3); c.) TTE               06/17/2020: EF >55%; severe AS (MPG 47.2); d.) s/p TAVR               08/31/2020 at [facility] - 23  mm Edwards Sapien 3 Ultra valve               placed via a RIGHT percutaneous femoral approach -->               complicated by intra/postoperative CHB requiring TVP and               ultimate PPM placement No date: Arthritis No date: Basal cell carcinoma (BCC) of back No date: Bilateral leg edema 2011: Breast cancer, right ([place])     Comment:  a.) stage Ia IMC (ER/PR +, HER2/neu -); b.) lumpectomy +              XRT in 2011; c.) completed 5 years AI therapy               (anastrozole) in 11/2014 No date: Cervical spinal stenosis No date: Cervical spondylosis No date: Chemotherapy induced nausea and vomiting 08/31/2020: Complete heart block (HCC)     Comment:  a.) TVP placed intraoperatively during TAVR secondary to              development of CHB; b.) Medtronic PPM placed 09/03/2020               for postoperative continued postoperative CHB No date: Coronary artery disease     Comment:  a.) RHC 08/11/2020: no sig CAD No date: DDD (degenerative disc disease), cervical No date: Diastolic dysfunction     Comment:  a.) TTE 08/17/2016: EF >55%, mild LVH, mild LAE, triv               panvalvular regurg, G1DD; b.) TTE 08/25/2018: EF >55%,               mod LVH, mild LAE, triv AR/TR/PR, mild MR, G1dd; c.) TTE               08/11/2020: EF >55%, mild LVHH, mod LAE, triv MR/TR 03/2021: Guillain Barr syndrome (HCC) No date: Heart murmur No date: History of bilateral cataract extraction No date: Hyperplastic colon polyp No date: Hypertension No date: Hyperthyroidism No date: Neuropathy No date: Osteopenia No date: PONV (postoperative nausea and vomiting) 09/03/2020: Presence of permanent cardiac pacemaker     Comment:  a.) TVP placed intraoperatively during TAVR secondary to              development of CHB; b.) Medtronic PPM placed 09/03/2020               for postoperative continued postoperative CHB 08/31/2020: S/P TAVR (transcatheter aortic valve replacement)     Comment:  a.) s/p  TAVR 08/31/2020 at [facility] - 23 mm Edwards Sapien 3              Ultra valve placed via a RIGHT percutaneous femoral               approach --> complicated by intra/postoperative CHB               requiring TVP and ultimate PPM placement 01/25/2022: Thyroid nodule     Comment:  a.) CT C-spine 01/25/2022: 22 mm right thyroid nodule  Past Surgical History: No date: BACK SURGERY 03/2009: BREAST  EXCISIONAL BIOPSY; Right No date: BREAST EXCISIONAL BIOPSY; Bilateral     Comment:  NEG No date: CATARACT EXTRACTION W/ INTRAOCULAR LENS  IMPLANT, BILATERAL No date: CHOLECYSTECTOMY 10/19/2019: COLONOSCOPY WITH PROPOFOL; N/A     Comment:  Procedure: COLONOSCOPY WITH PROPOFOL-attempted, unable               to perform poor prep;  Surgeon: Lucilla Lame, MD;                Location: [place];  Service: Endoscopy;                Laterality: N/A;  priority 3 10/20/2019: COLONOSCOPY WITH PROPOFOL; N/A     Comment:  Procedure: COLONOSCOPY WITH PROPOFOL;  Surgeon: Lucilla Lame, MD;  Location: [facility];  Service:               Endoscopy;  Laterality: N/A; 11/16/2021: COLONOSCOPY WITH PROPOFOL; N/A     Comment:  Procedure: COLONOSCOPY WITH PROPOFOL;  Surgeon: Lucilla Lame, MD;  Location: [facility];  Service:               Endoscopy;  Laterality: N/A; 10/19/2019: ESOPHAGEAL DILATION     Comment:  Procedure: ESOPHAGEAL DILATION;  Surgeon: Lucilla Lame,               MD;  Location: [place];  Service: Endoscopy;; 10/19/2019: ESOPHAGOGASTRODUODENOSCOPY (EGD) WITH PROPOFOL; N/A     Comment:  Procedure: ESOPHAGOGASTRODUODENOSCOPY (EGD) WITH               PROPOFOL;  Surgeon: Lucilla Lame, MD;  Location: [place];  Service: Endoscopy;  Laterality: N/A; 03/20/2022: FLEXIBLE SIGMOIDOSCOPY; N/A     Comment:  Procedure: FLEXIBLE SIGMOIDOSCOPY;  Surgeon: Lucilla Lame, MD;  Location: [facility];  Service:                Endoscopy;  Laterality: N/A; No date: HEMORRHOID SURGERY 2010: MASTECTOMY, PARTIAL; Right 09/03/2020: PACEMAKER INSERTION; N/A     Comment:  Procedure: PACEMAKER WITH LEFT BUNDLE LEAD (MEDTRONIC);               Location: [facility]; Surgeon: Teena Dunk, MD 2021Charlann Lange REMOVAL 11/23/2019: PORTACATH PLACEMENT; Right     Comment:  Procedure: INSERTION PORT-A-CATH;  Surgeon: Ronny Bacon, MD;  Location: [facility];  Service: General;                Laterality: Right; 10/2019: RIGHT COLECTOMY; Right     Comment:  Procedure: ROBOTIC ASSISTED TIGHT HEMICOLECTOMY 08/11/2020: RIGHT/LEFT HEART CATH AND CORONARY ANGIOGRAPHY; Right     Comment:  Procedure: RIGHT/LEFT HEART CATH AND CORONARY               ANGIOGRAPHY; Location: [facility]; Surgeon: Jori Moll, MD 08/31/2020: TRANSCATHETER AORTIC VALVE REPLACEMENT, TRANSFEMORAL;  Right     Comment:  Procedure: TRANSCATHETER AORTIC VALVE REPLACEMENT, RIGHT              TRANSFEMORAL; Location: [facility]; Surgeon: Durward Mallard, MD 04/11/2022: TUMOR EXCISION; N/A     Comment:  Procedure: TUMOR EXCISION RECTAL;  Surgeon: Christian Mate,  Sharlet Salina, MD;  Location: [facility];  Service: General;                Laterality: N/A;  BMI    Body Mass Index: 34.67 kg/m      Reproductive/Obstetrics negative OB ROS                                                              Anesthesia Evaluation  Patient identified by MRN, date of birth, ID band Patient awake    Reviewed: Allergy & Precautions, NPO status , Patient's Chart, lab work & pertinent test results  History of Anesthesia Complications Negative for: history of anesthetic complications  Airway Mallampati: III  TM Distance: <3 FB Neck ROM: full    Dental  (+) Chipped   Pulmonary neg pulmonary ROS, neg shortness of breath,    Pulmonary exam normal        Cardiovascular Exercise Tolerance: Good hypertension, Normal cardiovascular exam+  dysrhythmias + pacemaker + Valvular Problems/Murmurs      Neuro/Psych  Neuromuscular disease negative psych ROS   GI/Hepatic negative GI ROS, Neg liver ROS,   Endo/Other  Hypothyroidism Hyperthyroidism   Renal/GU negative Renal ROS  negative genitourinary   Musculoskeletal   Abdominal   Peds  Hematology negative hematology ROS (+)   Anesthesia Other Findings Past Medical History: No date: Anemia No date: Arthritis No date: Basal cell carcinoma     Comment:  back 2010: Breast cancer ([place])     Comment:  RT LUMPECTOMY No date: Broken humerus     Comment:  left arm, march 2023 No date: Cancer [facility])     Comment:  rt breast No date: Chemotherapy induced nausea and vomiting 2021: Colon cancer ([place])     Comment:  colon cancer No date: Heart murmur No date: Hypertension No date: Hypothyroidism     Comment:  currently not on meds No date: Neuropathy No date: Osteopenia 2010: Personal history of chemotherapy     Comment:  right breast ca 2010: Personal history of radiation therapy     Comment:  right breast ca No date: Port-A-Cath in place  Past Surgical History: 08/11/2020: aortic valve stenosis No date: BACK SURGERY 03/2009: BREAST EXCISIONAL BIOPSY; Right     Comment:  radiation and chemo + No date: BREAST EXCISIONAL BIOPSY; Bilateral     Comment:  NEG No date: BREAST SURGERY No date: CATARACT EXTRACTION W/ INTRAOCULAR LENS  IMPLANT, BILATERAL No date: CHOLECYSTECTOMY 10/19/2019: COLONOSCOPY WITH PROPOFOL; N/A     Comment:  Procedure: COLONOSCOPY WITH PROPOFOL-attempted, unable               to perform poor prep;  Surgeon: Lucilla Lame, MD;                Location: [place];  Service: Endoscopy;                Laterality: N/A;  priority 3 10/20/2019: COLONOSCOPY WITH PROPOFOL; N/A     Comment:  Procedure: COLONOSCOPY WITH PROPOFOL;  Surgeon: Lucilla Lame, MD;  Location: [facility];  Service:               Endoscopy;  Laterality:  N/A; 11/16/2021: COLONOSCOPY WITH  PROPOFOL; N/A     Comment:  Procedure: COLONOSCOPY WITH PROPOFOL;  Surgeon: Lucilla Lame, MD;  Location: [facility];  Service:               Endoscopy;  Laterality: N/A; 10/19/2019: ESOPHAGEAL DILATION     Comment:  Procedure: ESOPHAGEAL DILATION;  Surgeon: Lucilla Lame,               MD;  Location: [place];  Service: Endoscopy;; 10/19/2019: ESOPHAGOGASTRODUODENOSCOPY (EGD) WITH PROPOFOL; N/A     Comment:  Procedure: ESOPHAGOGASTRODUODENOSCOPY (EGD) WITH               PROPOFOL;  Surgeon: Lucilla Lame, MD;  Location: [place];  Service: Endoscopy;  Laterality: N/A; 11/23/2019: PORTACATH PLACEMENT; Right     Comment:  Procedure: INSERTION PORT-A-CATH;  Surgeon: Ronny Bacon, MD;  Location: [facility];  Service: General;                Laterality: Right;     Reproductive/Obstetrics negative OB ROS                             Anesthesia Physical Anesthesia Plan  ASA: 3  Anesthesia Plan: General   Post-op Pain Management:    Induction: Intravenous  PONV Risk Score and Plan: Propofol infusion and TIVA  Airway Management Planned: Natural Airway and Nasal Cannula  Additional Equipment:   Intra-op Plan:   Post-operative Plan:   Informed Consent: I have reviewed the patients History and Physical, chart, labs and discussed the procedure including the risks, benefits and alternatives for the proposed anesthesia with the patient or authorized representative who has indicated his/her understanding and acceptance.     Dental Advisory Given  Plan Discussed with: Anesthesiologist, CRNA and Surgeon  Anesthesia Plan Comments: (Patient consented for risks of anesthesia including but not limited to:  - adverse reactions to medications - risk of airway placement if required - damage to eyes, teeth, lips or other oral mucosa - nerve damage due to positioning  -  sore throat or hoarseness - Damage to heart, brain, nerves, lungs, other parts of body or loss of life  Patient voiced understanding.)        Anesthesia Quick Evaluation  Anesthesia Physical Anesthesia Plan  ASA: 2  Anesthesia Plan: General   Post-op Pain Management: Minimal or no pain anticipated   Induction: Intravenous  PONV Risk Score and Plan: 3 and Propofol infusion, TIVA and Ondansetron  Airway Management Planned: Nasal Cannula  Additional Equipment: None  Intra-op Plan:   Post-operative Plan: Extubation in OR  Informed Consent: I have reviewed the patients History and Physical, chart, labs and discussed the procedure including the risks, benefits and alternatives for the proposed anesthesia with the patient or authorized representative who has indicated his/her understanding and acceptance.     Dental advisory given  Plan Discussed with: CRNA and Surgeon  Anesthesia Plan Comments: (Discussed risks of anesthesia with patient, including possibility of difficulty with spontaneous ventilation under anesthesia necessitating airway intervention, PONV, and rare risks such as cardiac or respiratory or neurological events, and allergic reactions. Discussed the role of CRNA in patient's perioperative care. Patient understands.)  Anesthesia Quick Evaluation  

## 2022-07-18 NOTE — Transfer of Care (Addendum)
Immediate Anesthesia Transfer of Care Note  Patient: Maria Davies  Procedure(s) Performed: RECTAL EXAM UNDER ANESTHESIA (Rectum)  Patient Location: PACU  Anesthesia Type:MAC  Level of Consciousness: awake, alert , and oriented  Airway & Oxygen Therapy: Patient Spontanous Breathing and Patient connected to nasal cannula oxygen  Post-op Assessment: Report given to RN and Post -op Vital signs reviewed and stable  Post vital signs: Reviewed and stable  Last Vitals:  Vitals Value Taken Time  BP    Temp    Pulse    Resp    SpO2      Last Pain:  Vitals:   07/18/22 1117  TempSrc: Oral  PainSc: 4          Complications: No notable events documented.

## 2022-08-02 ENCOUNTER — Encounter: Payer: Self-pay | Admitting: Surgery

## 2022-08-16 ENCOUNTER — Telehealth: Payer: Self-pay | Admitting: Family Medicine

## 2022-08-16 DIAGNOSIS — Z952 Presence of prosthetic heart valve: Secondary | ICD-10-CM | POA: Diagnosis not present

## 2022-08-16 DIAGNOSIS — I700 Atherosclerosis of aorta: Secondary | ICD-10-CM | POA: Diagnosis not present

## 2022-08-16 DIAGNOSIS — I350 Nonrheumatic aortic (valve) stenosis: Secondary | ICD-10-CM | POA: Diagnosis not present

## 2022-08-16 DIAGNOSIS — I10 Essential (primary) hypertension: Secondary | ICD-10-CM | POA: Diagnosis not present

## 2022-08-16 DIAGNOSIS — I110 Hypertensive heart disease with heart failure: Secondary | ICD-10-CM | POA: Diagnosis not present

## 2022-08-16 DIAGNOSIS — I442 Atrioventricular block, complete: Secondary | ICD-10-CM | POA: Diagnosis not present

## 2022-08-16 DIAGNOSIS — Z950 Presence of cardiac pacemaker: Secondary | ICD-10-CM | POA: Diagnosis not present

## 2022-08-16 NOTE — Telephone Encounter (Signed)
Contacted Maria Davies to schedule their annual wellness visit. Appointment made for 09/10/2022.  Sherol Dade; Care Guide Ambulatory Clinical [facility] Direct Dial: [phone]

## 2022-08-21 ENCOUNTER — Encounter: Payer: Self-pay | Admitting: Oncology

## 2022-08-21 ENCOUNTER — Inpatient Hospital Stay: Payer: Medicare Other | Attending: Oncology | Admitting: Oncology

## 2022-08-21 ENCOUNTER — Inpatient Hospital Stay: Payer: Medicare Other

## 2022-08-21 VITALS — BP 127/71 | HR 78 | Resp 18 | Ht 63.0 in | Wt 193.0 lb

## 2022-08-21 DIAGNOSIS — I2510 Atherosclerotic heart disease of native coronary artery without angina pectoris: Secondary | ICD-10-CM | POA: Diagnosis not present

## 2022-08-21 DIAGNOSIS — Z8719 Personal history of other diseases of the digestive system: Secondary | ICD-10-CM | POA: Insufficient documentation

## 2022-08-21 DIAGNOSIS — I10 Essential (primary) hypertension: Secondary | ICD-10-CM | POA: Insufficient documentation

## 2022-08-21 DIAGNOSIS — Z803 Family history of malignant neoplasm of breast: Secondary | ICD-10-CM | POA: Insufficient documentation

## 2022-08-21 DIAGNOSIS — Z85828 Personal history of other malignant neoplasm of skin: Secondary | ICD-10-CM | POA: Diagnosis not present

## 2022-08-21 DIAGNOSIS — Z7982 Long term (current) use of aspirin: Secondary | ICD-10-CM | POA: Diagnosis not present

## 2022-08-21 DIAGNOSIS — M8580 Other specified disorders of bone density and structure, unspecified site: Secondary | ICD-10-CM | POA: Insufficient documentation

## 2022-08-21 DIAGNOSIS — Z950 Presence of cardiac pacemaker: Secondary | ICD-10-CM | POA: Diagnosis not present

## 2022-08-21 DIAGNOSIS — C182 Malignant neoplasm of ascending colon: Secondary | ICD-10-CM | POA: Diagnosis not present

## 2022-08-21 DIAGNOSIS — I350 Nonrheumatic aortic (valve) stenosis: Secondary | ICD-10-CM | POA: Insufficient documentation

## 2022-08-21 DIAGNOSIS — Z1231 Encounter for screening mammogram for malignant neoplasm of breast: Secondary | ICD-10-CM | POA: Diagnosis not present

## 2022-08-21 DIAGNOSIS — Z79899 Other long term (current) drug therapy: Secondary | ICD-10-CM | POA: Insufficient documentation

## 2022-08-21 DIAGNOSIS — K621 Rectal polyp: Secondary | ICD-10-CM | POA: Insufficient documentation

## 2022-08-21 DIAGNOSIS — E0590 Thyrotoxicosis, unspecified without thyrotoxic crisis or storm: Secondary | ICD-10-CM | POA: Diagnosis not present

## 2022-08-21 LAB — CBC WITH DIFFERENTIAL/PLATELET
Abs Immature Granulocytes: 0.03 10*3/uL (ref 0.00–0.07)
Basophils Absolute: 0 10*3/uL (ref 0.0–0.1)
Basophils Relative: 1 %
Eosinophils Absolute: 0.2 10*3/uL (ref 0.0–0.5)
Eosinophils Relative: 3 %
HCT: 38.8 % (ref 36.0–46.0)
Hemoglobin: 13.6 g/dL (ref 12.0–15.0)
Immature Granulocytes: 1 %
Lymphocytes Relative: 22 %
Lymphs Abs: 1.5 10*3/uL (ref 0.7–4.0)
MCH: 34 pg (ref 26.0–34.0)
MCHC: 35.1 g/dL (ref 30.0–36.0)
MCV: 97 fL (ref 80.0–100.0)
Monocytes Absolute: 0.9 10*3/uL (ref 0.1–1.0)
Monocytes Relative: 13 %
Neutro Abs: 4 10*3/uL (ref 1.7–7.7)
Neutrophils Relative %: 60 %
Platelets: 152 10*3/uL (ref 150–400)
RBC: 4 MIL/uL (ref 3.87–5.11)
RDW: 12.4 % (ref 11.5–15.5)
WBC: 6.6 10*3/uL (ref 4.0–10.5)
nRBC: 0 % (ref 0.0–0.2)

## 2022-08-21 NOTE — Progress Notes (Signed)
Maria Davies  Telephone:([phone]) [phone] Fax:(336) [phone]  ID: Maria Davies OB: [birth year]  MR#: [id number]  [id number]  Patient Care Team: Valerie Roys, DO as PCP - General (Family Medicine) Ronny Bacon, MD as Consulting Physician (General Surgery) Lloyd Huger, MD as Consulting Physician (Oncology)   CHIEF COMPLAINT: Stage IIIb right colon cancer.  INTERVAL HISTORY: Patient returns to clinic today for routine 43-monthevaluation and discussion of her laboratory results.  She continues to feel well and remains asymptomatic. She has no neurologic complaints.  She denies any recent fevers or illnesses.  She has no chest pain, shortness of breath, cough, or hemoptysis. She denies any vomiting, constipation, or diarrhea.  She has no melena or hematochezia.  She has no urinary complaints.  Patient offers no specific complaints today.  REVIEW OF SYSTEMS:   Review of Systems  Constitutional: Negative.  Negative for fever, malaise/fatigue and weight loss.  Respiratory: Negative.  Negative for cough, hemoptysis and shortness of breath.   Cardiovascular: Negative.  Negative for chest pain and leg swelling.  Gastrointestinal: Negative.  Negative for abdominal pain, blood in stool, melena and nausea.  Genitourinary: Negative.  Negative for hematuria.  Musculoskeletal: Negative.  Negative for back pain.  Skin: Negative.  Negative for rash.  Neurological: Negative.  Negative for dizziness, focal weakness, weakness and headaches.  Psychiatric/Behavioral: Negative.  The patient is not nervous/anxious.     As per HPI. Otherwise, a complete review of systems is negative.   PAST MEDICAL HISTORY: Past Medical History:  Diagnosis Date   Adenocarcinoma of colon ([facility] 10/20/2019   a.) stage IIIb; b.) RIGHT colon mass Bx (+) for invasive moderately differentiated adenocarcinoma (pT3 pN1a)   Anemia    Aortic atherosclerosis ([facility]    Aortic stenosis    a.) TTE  08/17/2016: EF >55%, mild AS (MPG 29.5); b.) TTE 08/25/2018: EF >55%; mod AS (MPG 42.3); c.) TTE 06/17/2020: EF >55%; severe AS (MPG 47.2); d.) s/p [place] [facility]- 23 mm Edwards Sapien 3 Ultra valve placed via a RIGHT percutaneous femoral approach --> complicated by intra/postoperative CHB requiring TVP and ultimate PPM placement   Arthritis    Basal cell carcinoma (BCC) of back    Bilateral leg edema    Breast cancer, right ([facility] 2011   a.) stage Ia IMC (ER/PR +, HER2/neu -); b.) lumpectomy + XRT in 2011; c.) completed 5 years AI therapy (anastrozole) in 11/2014   Cervical spinal stenosis    Cervical spondylosis    Chemotherapy induced nausea and vomiting    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   a.) TVP placed intraoperatively during TAVR secondary to development of CHB; b.) Medtronic PPM placed 09/03/2020 for postoperative continued postoperative CHB   Coronary artery disease    a.) RHC 08/11/2020: no sig CAD   DDD (degenerative disc disease), cervical    Diastolic dysfunction    a.) TTE 08/17/2016: EF >55%, mild LVH, mild LAE, triv panvalvular regurg, G1DD; b.) TTE 08/25/2018: EF >55%, mod LVH, mild LAE, triv AR/TR/PR, mild MR, G1dd; c.) TTE 08/11/2020: EF >55%, mild LVHH, mod LAE, triv MR/TR   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   Heart murmur    History of bilateral cataract extraction    Hyperplastic colon polyp    Hypertension    Hyperthyroidism    Neuropathy    Osteopenia    PONV (postoperative nausea and vomiting)    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   a.) TVP placed intraoperatively during  TAVR secondary to development of CHB; b.) Medtronic PPM placed 09/03/2020 for postoperative continued postoperative CHB   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   a.) s/p TAVR 08/31/2020 at [facility] - 23 mm Edwards Sapien 3 Ultra valve placed via a RIGHT percutaneous femoral approach --> complicated by intra/postoperative CHB requiring TVP and  ultimate PPM placement   Thyroid nodule 01/25/2022   a.) CT C-spine 01/25/2022: 22 mm right thyroid nodule    PAST SURGICAL HISTORY: Past Surgical History:  Procedure Laterality Date   BACK SURGERY     BREAST EXCISIONAL BIOPSY Right 03/2009   BREAST EXCISIONAL BIOPSY Bilateral    NEG   CATARACT EXTRACTION W/ INTRAOCULAR LENS  IMPLANT, BILATERAL     CHOLECYSTECTOMY     COLONOSCOPY WITH PROPOFOL N/A 10/19/2019   Procedure: COLONOSCOPY WITH PROPOFOL-attempted, unable to perform poor prep;  Surgeon: Lucilla Lame, MD;  Location: [place];  Service: Endoscopy;  Laterality: N/A;  priority 3   COLONOSCOPY WITH PROPOFOL N/A 10/20/2019   Procedure: COLONOSCOPY WITH PROPOFOL;  Surgeon: Lucilla Lame, MD;  Location: [facility] ENDOSCOPY;  Service: Endoscopy;  Laterality: N/A;   COLONOSCOPY WITH PROPOFOL N/A 11/16/2021   Procedure: COLONOSCOPY WITH PROPOFOL;  Surgeon: Lucilla Lame, MD;  Location: [facility] ENDOSCOPY;  Service: Endoscopy;  Laterality: N/A;   ESOPHAGEAL DILATION  10/19/2019   Procedure: ESOPHAGEAL DILATION;  Surgeon: Lucilla Lame, MD;  Location: [place];  Service: Endoscopy;;   ESOPHAGOGASTRODUODENOSCOPY (EGD) WITH PROPOFOL N/A 10/19/2019   Procedure: ESOPHAGOGASTRODUODENOSCOPY (EGD) WITH PROPOFOL;  Surgeon: Lucilla Lame, MD;  Location: [place];  Service: Endoscopy;  Laterality: N/A;   FLEXIBLE SIGMOIDOSCOPY N/A 03/20/2022   Procedure: FLEXIBLE SIGMOIDOSCOPY;  Surgeon: Lucilla Lame, MD;  Location: [facility] ENDOSCOPY;  Service: Endoscopy;  Laterality: N/A;   HEMORRHOID SURGERY     MASTECTOMY, PARTIAL Right 2010   PACEMAKER INSERTION N/A 09/03/2020   Procedure: PACEMAKER WITH LEFT BUNDLE LEAD (MEDTRONIC); Location: [facility]; Surgeon: Teena Dunk, MD   [facility] REMOVAL  2021   PORTACATH PLACEMENT Right 11/23/2019   Procedure: INSERTION PORT-A-CATH;  Surgeon: Ronny Bacon, MD;  Location: [facility] ORS;  Service: General;  Laterality: Right;   RECTAL EXAM UNDER  ANESTHESIA N/A 07/18/2022   Procedure: RECTAL EXAM UNDER ANESTHESIA;  Surgeon: Ronny Bacon, MD;  Location: [facility] OR[facility];  Service: General;  Laterality: N/A;   RIGHT COLECTOMY Right 10/2019   Procedure: ROBOTIC ASSISTED TIGHT HEMICOLECTOMY   RIGHT/LEFT HEART CATH AND CORONARY ANGIOGRAPHY Right 08/11/2020   Procedure: RIGHT/LEFT HEART CATH AND CORONARY ANGIOGRAPHY; Location: [facility]; Surgeon: Jori Moll, MD   TRANSCATHETER AORTIC VALVE REPLACEMENT, TRANSFEMORAL Right 08/31/2020   Procedure: TRANSCATHETER AORTIC VALVE REPLACEMENT, RIGHT TRANSFEMORAL; Location: [facility]; Surgeon: Durward Mallard, MD   TUMOR EXCISION N/A 04/11/2022   Procedure: TUMOR EXCISION RECTAL;  Surgeon: Ronny Bacon, MD;  Location: [facility] ORS;  Service: General;  Laterality: N/A;    FAMILY HISTORY: Family History  Problem Relation Age of Onset   Cancer Mother    Heart disease Father    Breast cancer Paternal Aunt 75    ADVANCED DIRECTIVES (Y/N):  N  HEALTH MAINTENANCE: Social History   Tobacco Use   Smoking status: Never    Passive exposure: Never   Smokeless tobacco: Never  Vaping Use   Vaping Use: Never used  Substance Use Topics   Alcohol use: Yes    Alcohol/week: 2.0 standard drinks of alcohol    Types: 2 Standard drinks or equivalent per week    Comment: only on weeks not having treatment  Drug use: No     Colonoscopy:  PAP:  Bone density:  Lipid panel:  Allergies  Allergen Reactions   Tape     Prefers Paper tape be used    Current Outpatient Medications  Medication Sig Dispense Refill   amLODipine (NORVASC) 5 MG tablet Take 1 tablet (5 mg total) by mouth every morning. 90 tablet 0   aspirin EC 81 MG tablet Take 81 mg by mouth daily.     benazepril (LOTENSIN) 20 MG tablet TAKE 1 TABLET BY MOUTH ONCE DAILY (Patient taking differently: Take 20 mg by mouth every morning.) 90 tablet 1   hydrochlorothiazide (MICROZIDE) 12.5 MG capsule Take 1 capsule (12.5 mg total) by mouth daily. (Patient taking  differently: Take 12.5 mg by mouth every morning.) 90 capsule 1   ibuprofen (ADVIL) 200 MG tablet Take 200 mg by mouth every 6 (six) hours as needed.     methimazole (TAPAZOLE) 10 MG tablet Take 5 mg by mouth every morning.     metoprolol succinate (TOPROL-XL) 50 MG 24 hr tablet TAKE 1 TABLET BY MOUTH ONCE DAILY WITH OR IMMEDIATELY FOLLOWING A MEAL (Patient taking differently: 50 mg every morning.) 90 tablet 0   Multiple Vitamin (MULTI-VITAMINS) TABS Take 1 tablet by mouth daily.      Probiotic Product (PROBIOTIC ADVANCED PO) Take 1 capsule by mouth daily. When taking antibiotics pt will take 2 capsules instead of 1, normally just takes 1 per day otherwise     silver sulfADIAZINE (SILVADENE) 1 % cream Apply 1 application topically daily. (Patient taking differently: Apply 1 application  topically as needed.) 50 g 0   Sodium Hyaluronate, oral, (HYALURONIC ACID PO) Take 1 tablet by mouth daily.     vitamin B-12 (CYANOCOBALAMIN) 1000 MCG tablet Take 1,000 mcg by mouth daily.     No current facility-administered medications for this visit.   Facility-Administered Medications Ordered in Other Visits  Medication Dose Route Frequency Provider Last Rate Last Admin   sodium chloride flush (NS) 0.9 % injection 10 mL  10 mL Intravenous PRN Lloyd Huger, MD   10 mL at 03/07/20 0836   sodium chloride flush (NS) 0.9 % injection 10 mL  10 mL Intravenous PRN Lloyd Huger, MD   10 mL at 06/21/20 1259    OBJECTIVE: Vitals:   08/21/22 1026 08/21/22 1042  BP:  127/71  Pulse:  78  Resp: 18 18  SpO2:  100%     Body mass index is 34.19 kg/m.    ECOG FS:0 - Asymptomatic  General: Well-developed, well-nourished, no acute distress. Eyes: Pink conjunctiva, anicteric sclera. HEENT: Normocephalic, moist mucous membranes. Lungs: No audible wheezing or coughing. Heart: Regular rate and rhythm. Abdomen: Soft, nontender, no obvious distention. Musculoskeletal: No edema, cyanosis, or clubbing. Neuro:  Alert, answering all questions appropriately. Cranial nerves grossly intact. Skin: No rashes or petechiae noted. Psych: Normal affect.  LAB RESULTS:  Lab Results  Component Value Date   NA 137 07/09/2022   K 4.1 07/09/2022   CL 95 (L) 07/09/2022   CO2 24 07/09/2022   GLUCOSE 103 (H) 07/09/2022   BUN 12 07/09/2022   CREATININE 0.63 07/09/2022   CALCIUM 10.1 07/09/2022   PROT 6.8 05/07/2022   ALBUMIN 4.5 05/07/2022   AST 20 05/07/2022   ALT 14 05/07/2022   ALKPHOS 89 05/07/2022   BILITOT 0.9 05/07/2022   GFRNONAA >60 04/11/2022   GFRAA >60 03/14/2020    Lab Results  Component Value Date   WBC  6.6 08/21/2022   NEUTROABS 4.0 08/21/2022   HGB 13.6 08/21/2022   HCT 38.8 08/21/2022   MCV 97.0 08/21/2022   PLT 152 08/21/2022     STUDIES: No results found.  ASSESSMENT: Stage IIIb right colon cancer  PLAN:    Stage IIIb right colon cancer: Pathology results reviewed, confirming stage of disease.  Port has been removed.  She only received 11 treatments of FOLFOX secondary to declining performance status completing on April 25, 2020.  Patient underwent colonoscopy in June 2023 where 3 polyps were removed.  Repeat flexible sigmoidoscopy on March 20, 2022 revealed 1 rectal polyp.  Continue follow-up per GI recommendations.  Imaging results with CT scan from February 12, 2022 reviewed independently and reported as above with no obvious evidence of recurrent or progressive disease.  CEA continues to be within normal limits.  No intervention is needed at this time.  Continue yearly imaging until patient is 5 years removed from completing her treatment in November 2026.  Return to clinic in 6 months with repeat laboratory work, imaging, and further evaluation.  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  She completed 5 years of anastrozole in June 2016.  Her most recent mammogram on February 05, 2022 was  reported as BI-RADS 1.  Repeat in August 2024. Heart disease: Continue follow-up at [facility] as scheduled. History of Guillain-Barr: Unclear etiology.  Patient reports possibly related to flu shot.  Patient's symptoms have resolved.  Patient expressed understanding and was in agreement with this plan. She also understands that She can call clinic at any time with any questions, concerns, or complaints.    Cancer Staging  Colon cancer [facility]) Staging form: Colon and Rectum, AJCC 8th Edition - Clinical stage from 11/13/2019: Stage IIIB (cT3, cN1a, cM0) - Signed by Lloyd Huger, MD on 11/13/2019 Total positive nodes: 1   Lloyd Huger, MD   08/21/2022 12:16 PM

## 2022-08-23 LAB — CEA: CEA: 1.9 ng/mL (ref 0.0–4.7)

## 2022-09-10 ENCOUNTER — Ambulatory Visit (INDEPENDENT_AMBULATORY_CARE_PROVIDER_SITE_OTHER): Payer: Medicare Other

## 2022-09-10 VITALS — Ht 63.0 in | Wt 193.0 lb

## 2022-09-10 DIAGNOSIS — Z0000 Encounter for general adult medical examination without abnormal findings: Secondary | ICD-10-CM

## 2022-09-10 NOTE — Patient Instructions (Signed)
Maria Davies , Thank you for taking time to come for your Medicare Wellness Visit. I appreciate your ongoing commitment to your health goals. Please review the following plan we discussed and let me know if I can assist you in the future.   These are the goals we discussed:  Goals      DIET - EAT MORE FRUITS AND VEGETABLES     Patient Stated     08/19/2020, keep weight around 140 pounds     Weight (lb) < 200 lb (90.7 kg)     Would like to loose 10 pounds        This is a list of the screening recommended for you and due dates:  Health Maintenance  Topic Date Due   COVID-19 Vaccine (4 - 2023-24 season) 02/16/2022   Flu Shot  09/16/2022*   Medicare Annual Wellness Visit  09/10/2023   DTaP/Tdap/Td vaccine (3 - Td or Tdap) 02/14/2031   Pneumonia Vaccine  Completed   DEXA scan (bone density measurement)  Completed   Zoster (Shingles) Vaccine  Completed   HPV Vaccine  Aged Out  *Topic was postponed. The date shown is not the original due date.    Advanced directives: no  Conditions/risks identified: none  Next appointment: Follow up in one year for your annual wellness visit 09/16/23 @ 9:15 am by phone   Preventive Care 65 Years and Older, Female Preventive care refers to lifestyle choices and visits with your health care provider that can promote health and wellness. What does preventive care include? A yearly physical exam. This is also called an annual well check. Dental exams once or twice a year. Routine eye exams. Ask your health care provider how often you should have your eyes checked. Personal lifestyle choices, including: Daily care of your teeth and gums. Regular physical activity. Eating a healthy diet. Avoiding tobacco and drug use. Limiting alcohol use. Practicing safe sex. Taking low-dose aspirin every day. Taking vitamin and mineral supplements as recommended by your health care provider. What happens during an annual well check? The services and  screenings done by your health care provider during your annual well check will depend on your age, overall health, lifestyle risk factors, and family history of disease. Counseling  Your health care provider may ask you questions about your: Alcohol use. Tobacco use. Drug use. Emotional well-being. Home and relationship well-being. Sexual activity. Eating habits. History of falls. Memory and ability to understand (cognition). Work and work Statistician. Reproductive health. Screening  You may have the following tests or measurements: Height, weight, and BMI. Blood pressure. Lipid and cholesterol levels. These may be checked every 5 years, or more frequently if you are over 38 years old. Skin check. Lung cancer screening. You may have this screening every year starting at age 16 if you have a 30-pack-year history of smoking and currently smoke or have quit within the past 15 years. Fecal occult blood test (FOBT) of the stool. You may have this test every year starting at age 3. Flexible sigmoidoscopy or colonoscopy. You may have a sigmoidoscopy every 5 years or a colonoscopy every 10 years starting at age 71. Hepatitis C blood test. Hepatitis B blood test. Sexually transmitted disease (STD) testing. Diabetes screening. This is done by checking your blood sugar (glucose) after you have not eaten for a while (fasting). You may have this done every 1-3 years. Bone density scan. This is done to screen for osteoporosis. You may have this done starting at age  65. Mammogram. This may be done every 1-2 years. Talk to your health care provider about how often you should have regular mammograms. Talk with your health care provider about your test results, treatment options, and if necessary, the need for more tests. Vaccines  Your health care provider may recommend certain vaccines, such as: Influenza vaccine. This is recommended every year. Tetanus, diphtheria, and acellular pertussis (Tdap,  Td) vaccine. You may need a Td booster every 10 years. Zoster vaccine. You may need this after age 33. Pneumococcal 13-valent conjugate (PCV13) vaccine. One dose is recommended after age 96. Pneumococcal polysaccharide (PPSV23) vaccine. One dose is recommended after age 24. Talk to your health care provider about which screenings and vaccines you need and how often you need them. This information is not intended to replace advice given to you by your health care provider. Make sure you discuss any questions you have with your health care provider. Document Released: 07/01/2015 Document Revised: 02/22/2016 Document Reviewed: 04/05/2015 Elsevier Interactive Patient Education  2017 [place] Prevention in the Home Falls can cause injuries. They can happen to people of all ages. There are many things you can do to make your home safe and to help prevent falls. What can I do on the outside of my home? Regularly fix the edges of walkways and driveways and fix any cracks. Remove anything that might make you trip as you walk through a door, such as a raised step or threshold. Trim any bushes or trees on the path to your home. Use bright outdoor lighting. Clear any walking paths of anything that might make someone trip, such as rocks or tools. Regularly check to see if handrails are loose or broken. Make sure that both sides of any steps have handrails. Any raised decks and porches should have guardrails on the edges. Have any leaves, snow, or ice cleared regularly. Use sand or salt on walking paths during winter. Clean up any spills in your garage right away. This includes oil or grease spills. What can I do in the bathroom? Use night lights. Install grab bars by the toilet and in the tub and shower. Do not use towel bars as grab bars. Use non-skid mats or decals in the tub or shower. If you need to sit down in the shower, use a plastic, non-slip stool. Keep the floor dry. Clean up any  water that spills on the floor as soon as it happens. Remove soap buildup in the tub or shower regularly. Attach bath mats securely with double-sided non-slip rug tape. Do not have throw rugs and other things on the floor that can make you trip. What can I do in the bedroom? Use night lights. Make sure that you have a light by your bed that is easy to reach. Do not use any sheets or blankets that are too big for your bed. They should not hang down onto the floor. Have a firm chair that has side arms. You can use this for support while you get dressed. Do not have throw rugs and other things on the floor that can make you trip. What can I do in the kitchen? Clean up any spills right away. Avoid walking on wet floors. Keep items that you use a lot in easy-to-reach places. If you need to reach something above you, use a strong step stool that has a grab bar. Keep electrical cords out of the way. Do not use floor polish or wax that makes floors slippery.  If you must use wax, use non-skid floor wax. Do not have throw rugs and other things on the floor that can make you trip. What can I do with my stairs? Do not leave any items on the stairs. Make sure that there are handrails on both sides of the stairs and use them. Fix handrails that are broken or loose. Make sure that handrails are as long as the stairways. Check any carpeting to make sure that it is firmly attached to the stairs. Fix any carpet that is loose or worn. Avoid having throw rugs at the top or bottom of the stairs. If you do have throw rugs, attach them to the floor with carpet tape. Make sure that you have a light switch at the top of the stairs and the bottom of the stairs. If you do not have them, ask someone to add them for you. What else can I do to help prevent falls? Wear shoes that: Do not have high heels. Have rubber bottoms. Are comfortable and fit you well. Are closed at the toe. Do not wear sandals. If you use a  stepladder: Make sure that it is fully opened. Do not climb a closed stepladder. Make sure that both sides of the stepladder are locked into place. Ask someone to hold it for you, if possible. Clearly mark and make sure that you can see: Any grab bars or handrails. First and last steps. Where the edge of each step is. Use tools that help you move around (mobility aids) if they are needed. These include: Canes. Walkers. Scooters. Crutches. Turn on the lights when you go into a dark area. Replace any light bulbs as soon as they burn out. Set up your furniture so you have a clear path. Avoid moving your furniture around. If any of your floors are uneven, fix them. If there are any pets around you, be aware of where they are. Review your medicines with your doctor. Some medicines can make you feel dizzy. This can increase your chance of falling. Ask your doctor what other things that you can do to help prevent falls. This information is not intended to replace advice given to you by your health care provider. Make sure you discuss any questions you have with your health care provider. Document Released: 03/31/2009 Document Revised: 11/10/2015 Document Reviewed: 07/09/2014 Elsevier Interactive Patient Education  2017 [employer].

## 2022-09-10 NOTE — Progress Notes (Signed)
I connected with  Maria Davies on 09/10/22 by a audio enabled telemedicine application and verified that I am speaking with the correct person using two identifiers.  Patient Location: Home  Provider Location: Office/Clinic  I discussed the limitations of evaluation and management by telemedicine. The patient expressed understanding and agreed to proceed.  Subjective:   Maria Davies is a 82 y.o. female who presents for Medicare Annual (Subsequent) preventive examination.  Review of Systems     Cardiac Risk Factors include: advanced age (>26men, >81 women);hypertension     Objective:    There were no vitals filed for this visit. There is no height or weight on file to calculate BMI.     09/10/2022    9:13 AM 08/21/2022   10:42 AM 07/18/2022   11:35 AM 04/11/2022   12:38 PM 04/04/2022    1:12 PM 03/20/2022    7:44 AM 01/25/2022    4:04 PM  Advanced Directives  Does Patient Have a Medical Advance Directive? No Yes Yes Yes Yes Yes No  Type of Corporate treasurer of [place];Living will [place];Living will Living will Living will [place];Living will   Does patient want to make changes to medical advance directive?  No - Patient declined No - Patient declined No - Patient declined     Copy of [place] in Chart?  No - copy requested No - copy requested   No - copy requested   Would patient like information on creating a medical advance directive? No - Patient declined No - Patient declined         Current Medications (verified) Outpatient Encounter Medications as of 09/10/2022  Medication Sig   amLODipine (NORVASC) 5 MG tablet Take 1 tablet (5 mg total) by mouth every morning.   aspirin EC 81 MG tablet Take 81 mg by mouth daily.   benazepril (LOTENSIN) 20 MG tablet TAKE 1 TABLET BY MOUTH ONCE DAILY (Patient taking differently: Take 20 mg by mouth every morning.)   hydrochlorothiazide (MICROZIDE) 12.5  MG capsule Take 1 capsule (12.5 mg total) by mouth daily. (Patient taking differently: Take 12.5 mg by mouth every morning.)   ibuprofen (ADVIL) 200 MG tablet Take 200 mg by mouth every 6 (six) hours as needed.   methimazole (TAPAZOLE) 10 MG tablet Take 5 mg by mouth every morning.   metoprolol succinate (TOPROL-XL) 50 MG 24 hr tablet TAKE 1 TABLET BY MOUTH ONCE DAILY WITH OR IMMEDIATELY FOLLOWING A MEAL (Patient taking differently: 50 mg every morning.)   Multiple Vitamin (MULTI-VITAMINS) TABS Take 1 tablet by mouth daily.    Probiotic Product (PROBIOTIC ADVANCED PO) Take 1 capsule by mouth daily. When taking antibiotics pt will take 2 capsules instead of 1, normally just takes 1 per day otherwise   Sodium Hyaluronate, oral, (HYALURONIC ACID PO) Take 1 tablet by mouth daily.   vitamin B-12 (CYANOCOBALAMIN) 1000 MCG tablet Take 1,000 mcg by mouth daily.   silver sulfADIAZINE (SILVADENE) 1 % cream Apply 1 application topically daily. 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   a.) stage IIIb; b.) RIGHT colon mass Bx (+) for invasive moderately differentiated  adenocarcinoma (pT3 pN1a)   Anemia    Aortic atherosclerosis (HCC)    Aortic stenosis    a.) TTE 08/17/2016: EF >55%, mild AS (MPG 29.5); b.) TTE 08/25/2018: EF >55%; mod AS (MPG 42.3); c.) TTE 06/17/2020: EF >55%; severe AS (MPG 47.2); d.) s/p TAVR 08/31/2020 at [place] - 23 mm Edwards Sapien 3 Ultra valve placed via a RIGHT percutaneous femoral approach --> complicated by intra/postoperative CHB requiring TVP and ultimate PPM placement   Arthritis    Basal cell carcinoma (BCC) of back    Bilateral leg edema    Breast cancer, right ([place])  2011   a.) stage Ia IMC (ER/PR +, HER2/neu -); b.) lumpectomy + XRT in 2011; c.) completed 5 years AI therapy (anastrozole) in 11/2014   Cervical spinal stenosis    Cervical spondylosis    Chemotherapy induced nausea and vomiting    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   a.) TVP placed intraoperatively during TAVR secondary to development of CHB; b.) Medtronic PPM placed 09/03/2020 for postoperative continued postoperative CHB   Coronary artery disease    a.) RHC 08/11/2020: no sig CAD   DDD (degenerative disc disease), cervical    Diastolic dysfunction    a.) TTE 08/17/2016: EF >55%, mild LVH, mild LAE, triv panvalvular regurg, G1DD; b.) TTE 08/25/2018: EF >55%, mod LVH, mild LAE, triv AR/TR/PR, mild MR, G1dd; c.) TTE 08/11/2020: EF >55%, mild LVHH, mod LAE, triv MR/TR   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   Heart murmur    History of bilateral cataract extraction    Hyperplastic colon polyp    Hypertension    Hyperthyroidism    Neuropathy    Osteopenia    PONV (postoperative nausea and vomiting)    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   a.) TVP placed intraoperatively during TAVR secondary to development of CHB; b.) Medtronic PPM placed 09/03/2020 for postoperative continued postoperative CHB   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   a.) s/p TAVR 08/31/2020 at [facility] - 23 mm Edwards Sapien 3 Ultra valve placed via a RIGHT percutaneous femoral approach --> complicated by intra/postoperative CHB requiring TVP and ultimate PPM placement   Thyroid nodule 01/25/2022   a.) CT C-spine 01/25/2022: 22 mm right thyroid nodule   Past Surgical History:  Procedure Laterality Date   BACK SURGERY     BREAST EXCISIONAL BIOPSY Right 03/2009   BREAST EXCISIONAL BIOPSY Bilateral    NEG   CATARACT EXTRACTION W/ INTRAOCULAR LENS  IMPLANT, BILATERAL     CHOLECYSTECTOMY     COLONOSCOPY WITH PROPOFOL N/A 10/19/2019   Procedure: COLONOSCOPY WITH PROPOFOL-attempted,  unable to perform poor prep;  Surgeon: Lucilla Lame, MD;  Location: [place];  Service: Endoscopy;  Laterality: N/A;  priority 3   COLONOSCOPY WITH PROPOFOL N/A 10/20/2019   Procedure: COLONOSCOPY WITH PROPOFOL;  Surgeon: Lucilla Lame, MD;  Location: [facility] ENDOSCOPY;  Service: Endoscopy;  Laterality: N/A;   COLONOSCOPY WITH PROPOFOL N/A 11/16/2021   Procedure: COLONOSCOPY WITH PROPOFOL;  Surgeon: Lucilla Lame, MD;  Location: [facility] ENDOSCOPY;  Service: Endoscopy;  Laterality: N/A;   ESOPHAGEAL DILATION  10/19/2019   Procedure: ESOPHAGEAL DILATION;  Surgeon: Lucilla Lame, MD;  Location: [place];  Service: Endoscopy;;   ESOPHAGOGASTRODUODENOSCOPY (EGD) WITH PROPOFOL N/A 10/19/2019   Procedure: ESOPHAGOGASTRODUODENOSCOPY (EGD) WITH PROPOFOL;  Surgeon: Lucilla Lame, MD;  Location: [place];  Service: Endoscopy;  Laterality: N/A;   FLEXIBLE SIGMOIDOSCOPY N/A 03/20/2022   Procedure: FLEXIBLE SIGMOIDOSCOPY;  Surgeon: Lucilla Lame,  MD;  Location: [facility] ENDOSCOPY;  Service: Endoscopy;  Laterality: N/A;   HEMORRHOID SURGERY     MASTECTOMY, PARTIAL Right 2010   PACEMAKER INSERTION N/A 09/03/2020   Procedure: PACEMAKER WITH LEFT BUNDLE LEAD (MEDTRONIC); Location: [facility]; Surgeon: Teena Dunk, MD   [facility] REMOVAL  2021   PORTACATH PLACEMENT Right 11/23/2019   Procedure: INSERTION PORT-A-CATH;  Surgeon: Ronny Bacon, MD;  Location: [facility];  Service: General;  Laterality: Right;   RECTAL EXAM UNDER ANESTHESIA N/A 07/18/2022   Procedure: RECTAL EXAM UNDER ANESTHESIA;  Surgeon: Ronny Bacon, MD;  Location: [facility];  Service: General;  Laterality: N/A;   RIGHT COLECTOMY Right 10/2019   Procedure: ROBOTIC ASSISTED TIGHT HEMICOLECTOMY   RIGHT/LEFT HEART CATH AND CORONARY ANGIOGRAPHY Right 08/11/2020   Procedure: RIGHT/LEFT HEART CATH AND CORONARY ANGIOGRAPHY; Location: [facility]; Surgeon: Jori Moll, MD   TRANSCATHETER AORTIC VALVE REPLACEMENT, TRANSFEMORAL Right 08/31/2020    Procedure: TRANSCATHETER AORTIC VALVE REPLACEMENT, RIGHT TRANSFEMORAL; Location: [facility]; Surgeon: Durward Mallard, MD   TUMOR EXCISION N/A 04/11/2022   Procedure: TUMOR EXCISION RECTAL;  Surgeon: Ronny Bacon, MD;  Location: [facility];  Service: General;  Laterality: N/A;   Family History  Problem Relation Age of Onset   Cancer Mother    Heart disease Father    Breast cancer Paternal Aunt 80   Social History   Socioeconomic History   Marital status: Widowed    Spouse name: Not on file   Number of children: Not on file   Years of education: Not on file   Highest education level: Not on file  Occupational History   Occupation: retired   Tobacco Use   Smoking status: Never    Passive exposure: Never   Smokeless tobacco: Never  Vaping Use   Vaping Use: Never used  Substance and Sexual Activity   Alcohol use: Yes    Alcohol/week: 2.0 standard drinks of alcohol    Types: 2 Standard drinks or equivalent per week    Comment: only on weeks not having treatment   Drug use: No   Sexual activity: Not Currently  Other Topics Concern   Not on file  Social History Narrative   Lives alone   Social Determinants of Health   Financial Resource Strain: Low Risk  (09/10/2022)   Overall Financial Resource Strain (CARDIA)    Difficulty of Paying Living Expenses: Not hard at all  Food Insecurity: No Food Insecurity (09/10/2022)   Hunger Vital Sign    Worried About Running Out of Food in the Last Year: Never true    Ran Out of Food in the Last Year: Never true  Transportation Needs: No Transportation Needs (09/10/2022)   PRAPARE - Hydrologist (Medical): No    Lack of Transportation (Non-Medical): No  Physical Activity: Insufficiently Active (09/10/2022)   Exercise Vital Sign    Days of Exercise per Week: 3 days    Minutes of Exercise per Session: 30 min  Stress: No Stress Concern Present (09/10/2022)   [place]    Feeling of Stress : Not at all  Social Connections: Moderately Isolated (09/10/2022)   Social Connection and Isolation Panel [NHANES]    Frequency of Communication with Friends and Family: More than three times a week    Frequency of Social Gatherings with Friends and Family: More than three times a week    Attends Religious Services: Never    Marine scientist or Organizations: Yes  Attends Archivist Meetings: More than 4 times per year    Marital Status: Widowed    Tobacco Counseling Counseling given: Not Answered   Clinical Intake:  Pre-visit preparation completed: Yes  Pain : No/denies pain     Nutritional Risks: None Diabetes: No  How often do you need to have someone help you when you read instructions, pamphlets, or other written materials from your doctor or pharmacy?: 1 - Never  Diabetic?no  Interpreter Needed?: No  Information entered by :: Kirke Shaggy, LPN   Activities of Daily Living    09/10/2022    9:15 AM 07/09/2022   12:05 PM  In your present state of health, do you have any difficulty performing the following activities:  Hearing? 0   Vision? 0   Difficulty concentrating or making decisions? 0   Walking or climbing stairs? 1   Comment neuropathy in feet   Dressing or bathing? 0   Doing errands, shopping? 0 0  Preparing Food and eating ? N   Using the Toilet? N   In the past six months, have you accidently leaked urine? N   Do you have problems with loss of bowel control? N   Managing your Medications? N   Managing your Finances? N   Housekeeping or managing your Housekeeping? N     Patient Care Team: Valerie Roys, DO as PCP - General (Family Medicine) Ronny Bacon, MD as Consulting Physician (General Surgery) Lloyd Huger, MD as Consulting Physician (Oncology)  Indicate any recent Medical Services you may have received from other than [facility] providers in the past year (date may be  approximate).     Assessment:   This is a routine wellness examination for Chanda.  Hearing/Vision screen Hearing Screening - Comments:: No aids Vision Screening - Comments:: Has implants- Dr.Cheek  Dietary issues and exercise activities discussed: Current Exercise Habits: Home exercise routine, Time (Minutes): 30, Frequency (Times/Week): 3, Weekly Exercise (Minutes/Week): 90, Intensity: Mild, Exercise limited by: None identified   Goals Addressed             This Visit's Progress    DIET - EAT MORE FRUITS AND VEGETABLES         Depression Screen    09/10/2022    9:11 AM 07/09/2022    9:47 AM 06/08/2022    8:24 AM 05/07/2022    9:23 AM 08/21/2021    8:39 AM 06/05/2021    1:43 PM 05/08/2021    1:33 PM  PHQ 2/9 Scores  PHQ - 2 Score 1 0 0 0 0 0 0  PHQ- 9 Score 1 1 2 2   0 0    Fall Risk    09/10/2022    9:14 AM 07/09/2022    9:48 AM 06/26/2022   10:35 AM 06/08/2022    8:22 AM 05/07/2022    9:22 AM  Fall Risk   Falls in the past year? 0 1 0 1 0  Number falls in past yr: 0 0  1 0  Injury with Fall? 0 0  0 0  Risk for fall due to : History of fall(s) No Fall Risks  No Fall Risks No Fall Risks  Follow up Falls prevention discussed;Falls evaluation completed Falls evaluation completed  Falls evaluation completed Falls evaluation completed    FALL RISK PREVENTION PERTAINING TO THE HOME:  Any stairs in or around the home? No  If so, are there any without handrails? No  Home free of loose throw rugs  in walkways, pet beds, electrical cords, etc? Yes  Adequate lighting in your home to reduce risk of falls? Yes   ASSISTIVE DEVICES UTILIZED TO PREVENT FALLS:  Life alert? No  Use of a cane, walker or w/c? No  Grab bars in the bathroom? Yes  Shower chair or bench in shower? Yes  Elevated toilet seat or a handicapped toilet? Yes   Cognitive Function:        09/10/2022    9:20 AM 08/19/2020    8:21 AM  6CIT Screen  What Year? 0 points 0 points  What month? 0 points 0  points  What time? 0 points 0 points  Count back from 20 0 points 0 points  Months in reverse 0 points 0 points  Repeat phrase 0 points 4 points  Total Score 0 points 4 points    Immunizations Immunization History  Administered Date(s) Administered   Fluad Quad(high Dose 65+) 02/18/2019, 04/18/2020   Influenza, High Dose Seasonal PF 04/23/2016, 03/25/2017   Influenza,inj,Quad PF,6+ Mos 04/11/2015   Influenza-Unspecified 04/01/2018, 05/19/2020   PFIZER(Purple Top)SARS-COV-2 Vaccination 06/25/2019, 07/16/2019, 05/25/2020   Pneumococcal Conjugate-13 04/26/2014   Pneumococcal Polysaccharide-23 07/03/2016   Td 02/13/2021   Tdap 02/10/2009   Zoster Recombinat (Shingrix) 02/18/2018, 05/09/2018   Zoster, Live 10/24/2006    TDAP status: Up to date  Flu Vaccine status: Declined, Education has been provided regarding the importance of this vaccine but patient still declined. Advised may receive this vaccine at local pharmacy or Health Dept. Aware to provide a copy of the vaccination record if obtained from local pharmacy or Health Dept. Verbalized acceptance and understanding.  Pneumococcal vaccine status: Up to date  Covid-19 vaccine status: Completed vaccines  Qualifies for Shingles Vaccine? Yes   Zostavax completed Yes   Shingrix Completed?: Yes  Screening Tests Health Maintenance  Topic Date Due   COVID-19 Vaccine (4 - 2023-24 season) 02/16/2022   INFLUENZA VACCINE  09/16/2022 (Originally 01/16/2022)   Medicare Annual Wellness (AWV)  09/10/2023   DTaP/Tdap/Td (3 - Td or Tdap) 02/14/2031   Pneumonia Vaccine 44+ Years old  Completed   DEXA SCAN  Completed   Zoster Vaccines- Shingrix  Completed   HPV VACCINES  Aged Out    Health Maintenance  Health Maintenance Due  Topic Date Due   COVID-19 Vaccine (4 - 2023-24 season) 02/16/2022    Colorectal cancer screening: No longer required.   Mammogram status: No longer required due to age.  Bone Density status: Completed  07/16/22. Results reflect: Bone density results: OSTEOPENIA. Repeat every 5 years.  Lung Cancer Screening: (Low Dose CT Chest recommended if Age 73-80 years, 30 pack-year currently smoking OR have quit w/in 15years.) does not qualify.   Additional Screening:  Hepatitis C Screening: does not qualify; Completed no  Vision Screening: Recommended annual ophthalmology exams for early detection of glaucoma and other disorders of the eye. Is the patient up to date with their annual eye exam?  Yes  Who is the provider or what is the name of the office in which the patient attends annual eye exams? Dr.Cheek If pt is not established with a provider, would they like to be referred to a provider to establish care? No .   Dental Screening: Recommended annual dental exams for proper oral hygiene  Community Resource Referral / Chronic Care Management: CRR required this visit?  No   CCM required this visit?  No      Plan:     I have personally reviewed and  noted the following in the patient's chart:   Medical and social history Use of alcohol, tobacco or illicit drugs  Current medications and supplements including opioid prescriptions. Patient is not currently taking opioid prescriptions. Functional ability and status Nutritional status Physical activity Advanced directives List of other physicians Hospitalizations, surgeries, and ER visits in previous 12 months Vitals Screenings to include cognitive, depression, and falls Referrals and appointments  In addition, I have reviewed and discussed with patient certain preventive protocols, quality metrics, and best practice recommendations. A written personalized care plan for preventive services as well as general preventive health recommendations were provided to patient.     Dionisio David, LPN   [id number]   Nurse Notes: none

## 2022-09-13 ENCOUNTER — Encounter: Payer: Self-pay | Admitting: Surgery

## 2022-09-13 ENCOUNTER — Ambulatory Visit (INDEPENDENT_AMBULATORY_CARE_PROVIDER_SITE_OTHER): Payer: Medicare Other | Admitting: Surgery

## 2022-09-13 VITALS — BP 135/83 | HR 99 | Temp 98.1°F | Ht 63.0 in | Wt 190.0 lb

## 2022-09-13 DIAGNOSIS — K621 Rectal polyp: Secondary | ICD-10-CM

## 2022-09-13 NOTE — Progress Notes (Signed)
[facility] POST-OP OFFICE VISIT  09/13/2022  HPI: Maria Davies is a 82 y.o. female  s/p transanal hemorrhoidectomy/polypectomy(polyp excision) 10/23,  and f/u negative REUA almost 3 mos ago.  She seems to be doing well, having no pain.  No discomfort on bowel movements.  No bleeding.  No nausea, vomiting, fevers or chills.  On chart review:  A 10 mm rectal polyp was biopsied June 2023:  Path report from November 16, 2021: SURGICAL PATHOLOGY  CASE: [id number]  PATIENT: Maria Davies  Surgical Pathology Report   Specimen Submitted:  A. Colon polyp, descending; cold snare  B. Rectum polyp; cold snare  C. Rectum polyp; cbx   Clinical History: Personal history of colon cancer.  Surgical site with  sutures intact, diverticulosis, colon polyps   DIAGNOSIS:  A. COLON POLYP, DESCENDING; COLD SNARE:  - TUBULAR ADENOMA.  - NEGATIVE FOR HIGH-GRADE DYSPLASIA AND MALIGNANCY.   B.  RECTUM POLYP; COLD SNARE:  - TUBULAR ADENOMA.  - NEGATIVE FOR HIGH-GRADE DYSPLASIA AND MALIGNANCY.   C.  RECTUM POLYP; COLD BIOPSY:  - SMALL SUPERFICIAL FRAGMENTS OF INFLAMED COLONIC-TYPE MUCOSA WITH  SERRATED ARCHITECTURE AND SURFACE EROSION, SEE COMMENT.  - DEEPER SECTIONS EXAMINED.  - NEGATIVE FOR DYSPLASIA AND MALIGNANCY.   Flexible sig 03/20/22 with rectal polyp biopsy.     SURGICAL PATHOLOGY 03/20/22 CASE: [id number]  PATIENT: Maria Davies  Surgical Pathology Report   Specimen Submitted:  A. Rectum polyp; cbx   Clinical History: History of colon polyps Z86.010.  Rectal polyp   DIAGNOSIS:  A. RECTUM, POLYP; BIOPSIES:  - SESSILE SERRATED POLYP.  - FOCAL SUPERIMPOSED MUCOSAL PROLAPSE TYPE CHANGE.  - NO EVIDENCE OF HIGH-GRADE DYSPLASIA OR MALIGNANCY.   SURGICAL PATHOLOGY 04/11/22  CASE: [id number]  PATIENT: Maria Davies  Surgical Pathology Report   Specimen Submitted:  A. Rectum polyp   Clinical History: Rectal polyp   DIAGNOSIS:  A. RECTUM POLYP;  EXCISION:  - POLYPOID COLONIC-TYPE MUCOSA WITH SUPERFICIAL ULCERATION AND  PROLAPSE-TYPE CHANGE.  - FOCAL SQUAMOUS MUCOSA IS PRESENT.  - SEE COMMENT.  - SMALL HYPERPLASTIC POLYP.  - FOCAL DILATED SUBMUCOSAL VESSELS SUGGESTIVE OF HEMORRHOID.  - NEGATIVE FOR DYSPLASIA AND MALIGNANCY.   Comment:  The differential diagnosis for the findings includes solitary rectal  ulcer syndrome (SRUS) and inflammatory cloacogenic polyp (ICP). While  the presence of focal squamous mucosa would favor an inflammatory  cloacogenic polyp correlation with clinical impression is required.  Vital signs: BP 135/83   Pulse 99   Temp 98.1 F (36.7 C) (Oral)   Ht 5\' 3"  (1.6 m)   Wt 190 lb (86.2 kg)   SpO2 97%   BMI 33.66 kg/m    Physical Exam: Constitutional: She appears well Chest normal unlabored respirations. CV:  well perfused. . Abdomen soft nontender. Levada Dy present as chaperone: DRE completed.  There are a couple soft external tags without inflammatory changes, mild residual internal hemorrhoids, I feel little irregularity consistent with scarring in the distal rectum.  No appreciable mucosal mass or polyp again on examination.  Assessment/Plan: This is a 82 y.o. female s/p transanal hemorrhoidectomy prolapsing hemorrhoid/anal polyp Oct 2023.   Despite the lack of concordance with prior pathology, and repeated exam under anesthesia, I cannot appreciate the prior findings with biopsies which were consistent with benign serrated polyp.  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   Bilateral leg weakness 03/31/2021   Presence of permanent cardiac pacemaker 12/13/2020   Aortic atherosclerosis  ([place]) 09/07/2020   Postoperative anemia 09/01/2020   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   Post-operative pain 08/31/2020   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   Personal history of other malignant neoplasm of large intestine 06/18/2020   Presence of cardiac pacemaker 06/18/2020   Goals of care, counseling/discussion 11/19/2019   Status post laparoscopic colectomy 11/17/2019   Colon cancer ([place]) 10/28/2019   Iron deficiency anemia secondary to blood loss (chronic)    Polyp of sigmoid colon    Benign neoplasm of cecum    Intestines neoplasm    Stricture and stenosis of esophagus    Advanced care planning/counseling discussion 07/22/2017   Hypercholesteremia 07/03/2016   Osteoporosis 07/03/2016   Carcinoma of right breast ([place]) 06/19/2016   Aortic stenosis, mild 05/02/2015   Hypertension 05/02/2015   Hyperthyroidism 05/02/2015   Breast cancer in female [facility]) 05/02/2015    Will have her return in 6 mos or PRN for continued surveillance, we considered repeat flexible sigmoidoscopy as well.   Ronny Bacon M.D., FACS 09/13/2022, 9:37 AM

## 2022-09-13 NOTE — Patient Instructions (Signed)
Advised to pursue a goal of 25 to 30 g of fiber daily.  Made aware that the majority of this may be through natural sources, but advised to be aware of actual consumption and to ensure minimal consumption by daily supplementation.  Various forms of supplements discussed.  Recommended Psyllium husk, that mixes well with applesauce, or the powder which goes down well shaken with chocolate milk.  Strongly advised to consume more fluids to ensure adequate hydration, instructed to watch color of urine to determine adequacy of hydration.  Clarity is pursued in urine output, and bowel activity that correlates to significant meal intake.   We need to avoid deferring having bowel movements, advised to take the time at the first sign of sensation, typically following meals, and in the morning.   Subsequent utilization of MiraLAX may be needed ensure at least daily movement, ideally twice daily bowel movements.  If multiple doses of MiraLAX are necessary utilize them. Never skip a day...  To be regular, we must do the above EVERY day.

## 2022-09-27 DIAGNOSIS — E0590 Thyrotoxicosis, unspecified without thyrotoxic crisis or storm: Secondary | ICD-10-CM | POA: Diagnosis not present

## 2022-10-04 DIAGNOSIS — E0590 Thyrotoxicosis, unspecified without thyrotoxic crisis or storm: Secondary | ICD-10-CM | POA: Diagnosis not present

## 2022-10-22 ENCOUNTER — Other Ambulatory Visit: Payer: Self-pay | Admitting: Family Medicine

## 2022-10-22 DIAGNOSIS — I10 Essential (primary) hypertension: Secondary | ICD-10-CM

## 2022-10-23 NOTE — Telephone Encounter (Signed)
Requested Prescriptions  Pending Prescriptions Disp Refills   metoprolol succinate (TOPROL-XL) 50 MG 24 hr tablet [Pharmacy Med Name: METOPROLOL SUCCINATE ER 50 MG TAB] 90 tablet 0    Sig: TAKE 1 TABLET BY MOUTH ONCE DAILY WITH OR IMMEDIATELY FOLLOWING A MEAL     Cardiovascular:  Beta Blockers Passed - 10/22/2022  1:51 PM      Passed - Last BP in normal range    BP Readings from Last 1 Encounters:  09/13/22 135/83         Passed - Last Heart Rate in normal range    Pulse Readings from Last 1 Encounters:  09/13/22 99         Passed - Valid encounter within last 6 months    Recent Outpatient Visits           3 months ago Primary hypertension   [place] [facility] [place], Megan P, DO   4 months ago Hypertension, unspecified type   [place] [facility] Larae Grooms, NP   5 months ago Hypertension, unspecified type   [place] [facility] [place], Megan P, DO   11 months ago Hypertension, unspecified type   [place] [facility] [place], [place], DO   1 year ago Fall, subsequent encounter   [place] [facility] Loura Pardon, MD       Future Appointments             In 1 week Dorcas Carrow, DO [facility]

## 2022-10-29 ENCOUNTER — Other Ambulatory Visit: Payer: Self-pay | Admitting: Family Medicine

## 2022-10-30 NOTE — Telephone Encounter (Signed)
Refilled 07/09/22 #90 with 1 refill. Requested Prescriptions  Refused Prescriptions Disp Refills   hydrochlorothiazide (MICROZIDE) 12.5 MG capsule [Pharmacy Med Name: HYDROCHLOROTHIAZIDE 12.5 MG CAP] 90 capsule 1    Sig: TAKE 1 CAPSULE BY MOUTH ONCE DAILY     Cardiovascular: Diuretics - Thiazide Passed - 10/29/2022  2:10 PM      Passed - Cr in normal range and within 180 days    Creatinine  Date Value Ref Range Status  08/15/2012 0.65 0.60 - 1.30 mg/dL Final   Creatinine, Ser  Date Value Ref Range Status  07/09/2022 0.63 0.57 - 1.00 mg/dL Final         Passed - K in normal range and within 180 days    Potassium  Date Value Ref Range Status  07/09/2022 4.1 3.5 - 5.2 mmol/L Final  08/15/2012 4.3 3.5 - 5.1 mmol/L Final         Passed - Na in normal range and within 180 days    Sodium  Date Value Ref Range Status  07/09/2022 137 134 - 144 mmol/L Final  08/15/2012 141 136 - 145 mmol/L Final         Passed - Last BP in normal range    BP Readings from Last 1 Encounters:  09/13/22 135/83         Passed - Valid encounter within last 6 months    Recent Outpatient Visits           3 months ago Primary hypertension   [place] [facility] [place], Megan P, DO   4 months ago Hypertension, unspecified type   [place] [facility] Larae Grooms, NP   5 months ago Hypertension, unspecified type   [place] [facility] [place], Megan P, DO   12 months ago Hypertension, unspecified type   [place] [facility] [place], [place], DO   1 year ago Fall, subsequent encounter   [place] [facility] Loura Pardon, MD       Future Appointments             In 6 days Dorcas Carrow, DO [place] [facility]

## 2022-11-05 ENCOUNTER — Encounter: Payer: Self-pay | Admitting: Family Medicine

## 2022-11-05 ENCOUNTER — Ambulatory Visit (INDEPENDENT_AMBULATORY_CARE_PROVIDER_SITE_OTHER): Payer: Medicare Other | Admitting: Family Medicine

## 2022-11-05 VITALS — BP 125/74 | HR 87 | Temp 98.4°F | Ht 63.0 in | Wt 191.8 lb

## 2022-11-05 DIAGNOSIS — E7800 Pure hypercholesterolemia, unspecified: Secondary | ICD-10-CM | POA: Diagnosis not present

## 2022-11-05 DIAGNOSIS — I10 Essential (primary) hypertension: Secondary | ICD-10-CM

## 2022-11-05 DIAGNOSIS — I700 Atherosclerosis of aorta: Secondary | ICD-10-CM | POA: Diagnosis not present

## 2022-11-05 MED ORDER — AMLODIPINE BESYLATE 5 MG PO TABS: 5.0000 mg | ORAL_TABLET | ORAL | 1 refills | Status: DC

## 2022-11-05 MED ORDER — BENAZEPRIL HCL 20 MG PO TABS: 20.0000 mg | ORAL_TABLET | ORAL | 1 refills | Status: DC

## 2022-11-05 MED ORDER — METOPROLOL SUCCINATE ER 50 MG PO TB24: ORAL_TABLET | ORAL | 1 refills | Status: DC

## 2022-11-05 MED ORDER — HYDROCHLOROTHIAZIDE 12.5 MG PO CAPS: 12.5000 mg | ORAL_CAPSULE | ORAL | 1 refills | Status: DC

## 2022-11-05 NOTE — Assessment & Plan Note (Signed)
Under good control on current regimen. Continue current regimen. Continue to monitor. Call with any concerns. Refills given. Labs drawn today.   

## 2022-11-05 NOTE — Assessment & Plan Note (Signed)
Will keep BP and cholesterol under good control. Continue to monitor. Call with any concerns.  

## 2022-11-05 NOTE — Progress Notes (Signed)
BP 125/74   Pulse 87   Temp 98.4 F (36.9 C) (Oral)   Ht 5\' 3"  (1.6 m)   Wt 191 lb 12.8 oz (87 kg)   SpO2 98%   BMI 33.98 kg/m    Subjective:    Patient ID: Maria Davies, female    DOB: [birth year], 82 y.o.   MRN: [id number]  HPI: Maria Davies is a 82 y.o. female  Chief Complaint  Patient presents with   Hypertension   Hypothyroidism   HYPERTENSION / HYPERLIPIDEMIA Satisfied with current treatment? yes Duration of hypertension: chronic BP monitoring frequency: not checking BP medication side effects: no Past BP meds: amlodipine, benzapril, HCTZ, metoprolol Duration of hyperlipidemia: chronic Cholesterol medication side effects: no Cholesterol supplements: none Past cholesterol medications: none Medication compliance: excellent compliance Aspirin: yes Recent stressors: no Recurrent headaches: no Visual changes: no Palpitations: no Dyspnea: no Chest pain: no Lower extremity edema: yes Dizzy/lightheaded: no  Relevant past medical, surgical, family and social history reviewed and updated as indicated. Interim medical history since our last visit reviewed. Allergies and medications reviewed and updated.  Review of Systems  Constitutional: Negative.   Respiratory: Negative.    Cardiovascular: Negative.   Gastrointestinal: Negative.   Musculoskeletal: Negative.     Per HPI unless specifically indicated above     Objective:    BP 125/74   Pulse 87   Temp 98.4 F (36.9 C) (Oral)   Ht 5\' 3"  (1.6 m)   Wt 191 lb 12.8 oz (87 kg)   SpO2 98%   BMI 33.98 kg/m   Wt Readings from Last 3 Encounters:  11/05/22 191 lb 12.8 oz (87 kg)  09/13/22 190 lb (86.2 kg)  09/10/22 193 lb (87.5 kg)    Physical Exam Vitals and nursing note reviewed.  Constitutional:      General: She is not in acute distress.    Appearance: Normal appearance. She is not ill-appearing, toxic-appearing or diaphoretic.  HENT:     Head: Normocephalic and atraumatic.     Right Ear:  External ear normal.     Left Ear: External ear normal.     Nose: Nose normal.     Mouth/Throat:     Mouth: Mucous membranes are moist.     Pharynx: Oropharynx is clear.  Eyes:     General: No scleral icterus.       Right eye: No discharge.        Left eye: No discharge.     Extraocular Movements: Extraocular movements intact.     Conjunctiva/sclera: Conjunctivae normal.     Pupils: Pupils are equal, round, and reactive to light.  Cardiovascular:     Rate and Rhythm: Normal rate and regular rhythm.     Pulses: Normal pulses.     Heart sounds: Normal heart sounds. No murmur heard.    No friction rub. No gallop.  Pulmonary:     Effort: Pulmonary effort is normal. No respiratory distress.     Breath sounds: Normal breath sounds. No stridor. No wheezing, rhonchi or rales.  Chest:     Chest wall: No tenderness.  Musculoskeletal:        General: Normal range of motion.     Cervical back: Normal range of motion and neck supple.  Skin:    General: Skin is warm and dry.     Capillary Refill: Capillary refill takes less than 2 seconds.     Coloration: Skin is not jaundiced or pale.  Findings: No bruising, erythema, lesion or rash.  Neurological:     General: No focal deficit present.     Mental Status: She is alert and oriented to person, place, and time. Mental status is at baseline.  Psychiatric:        Mood and Affect: Mood normal.        Behavior: Behavior normal.        Thought Content: Thought content normal.        Judgment: Judgment normal.     Results for orders placed or performed in visit on 08/21/22  CEA  Result Value Ref Range   CEA 1.9 0.0 - 4.7 ng/mL  CBC with Differential/Platelet  Result Value Ref Range   WBC 6.6 4.0 - 10.5 K/uL   RBC 4.00 3.87 - 5.11 MIL/uL   Hemoglobin 13.6 12.0 - 15.0 g/dL   HCT 16.1 09.6 - 04.5 %   MCV 97.0 80.0 - 100.0 fL   MCH 34.0 26.0 - 34.0 pg   MCHC 35.1 30.0 - 36.0 g/dL   RDW 40.9 81.1 - 91.4 %   Platelets 152 150 - 400  K/uL   nRBC 0.0 0.0 - 0.2 %   Neutrophils Relative % 60 %   Neutro Abs 4.0 1.7 - 7.7 K/uL   Lymphocytes Relative 22 %   Lymphs Abs 1.5 0.7 - 4.0 K/uL   Monocytes Relative 13 %   Monocytes Absolute 0.9 0.1 - 1.0 K/uL   Eosinophils Relative 3 %   Eosinophils Absolute 0.2 0.0 - 0.5 K/uL   Basophils Relative 1 %   Basophils Absolute 0.0 0.0 - 0.1 K/uL   Immature Granulocytes 1 %   Abs Immature Granulocytes 0.03 0.00 - 0.07 K/uL      Assessment & Plan:   Problem List Items Addressed This Visit       Cardiovascular and Mediastinum   Hypertension    Under good control on current regimen. Continue current regimen. Continue to monitor. Call with any concerns. Refills given. Labs drawn today.        Relevant Medications   amLODipine (NORVASC) 5 MG tablet   benazepril (LOTENSIN) 20 MG tablet   hydrochlorothiazide (MICROZIDE) 12.5 MG capsule   metoprolol succinate (TOPROL-XL) 50 MG 24 hr tablet   Other Relevant Orders   Comprehensive metabolic panel   CBC with Differential/Platelet   Aortic atherosclerosis (HCC)    Will keep BP and cholesterol under good control. Continue to monitor. Call with any concerns.       Relevant Medications   amLODipine (NORVASC) 5 MG tablet   benazepril (LOTENSIN) 20 MG tablet   hydrochlorothiazide (MICROZIDE) 12.5 MG capsule   metoprolol succinate (TOPROL-XL) 50 MG 24 hr tablet   Other Relevant Orders   Comprehensive metabolic panel   CBC with Differential/Platelet   Lipid Panel w/o Chol/HDL Ratio     Other   Hypercholesteremia - Primary    Under good control on current regimen. Continue current regimen. Continue to monitor. Call with any concerns. Refills given. Labs drawn today.       Relevant Medications   amLODipine (NORVASC) 5 MG tablet   benazepril (LOTENSIN) 20 MG tablet   hydrochlorothiazide (MICROZIDE) 12.5 MG capsule   metoprolol succinate (TOPROL-XL) 50 MG 24 hr tablet   Other Relevant Orders   Comprehensive metabolic panel    CBC with Differential/Platelet   Lipid Panel w/o Chol/HDL Ratio     Follow up plan: Return in about 6 months (around 05/08/2023).

## 2022-11-06 ENCOUNTER — Encounter: Payer: Self-pay | Admitting: Family Medicine

## 2022-11-06 LAB — COMPREHENSIVE METABOLIC PANEL
ALT: 21 IU/L (ref 0–32)
AST: 30 IU/L (ref 0–40)
Albumin/Globulin Ratio: 2 (ref 1.2–2.2)
Albumin: 4.4 g/dL (ref 3.8–4.8)
Alkaline Phosphatase: 90 IU/L (ref 44–121)
BUN/Creatinine Ratio: 11 — ABNORMAL LOW (ref 12–28)
BUN: 7 mg/dL — ABNORMAL LOW (ref 8–27)
Bilirubin Total: 0.7 mg/dL (ref 0.0–1.2)
CO2: 27 mmol/L (ref 20–29)
Calcium: 10 mg/dL (ref 8.7–10.3)
Chloride: 93 mmol/L — ABNORMAL LOW (ref 96–106)
Creatinine, Ser: 0.64 mg/dL (ref 0.57–1.00)
Globulin, Total: 2.2 g/dL (ref 1.5–4.5)
Glucose: 108 mg/dL — ABNORMAL HIGH (ref 70–99)
Potassium: 3.5 mmol/L (ref 3.5–5.2)
Sodium: 136 mmol/L (ref 134–144)
Total Protein: 6.6 g/dL (ref 6.0–8.5)
eGFR: 89 mL/min/{1.73_m2} (ref >59)

## 2022-11-06 LAB — CBC WITH DIFFERENTIAL/PLATELET
Basophils Absolute: 0.1 10*3/uL (ref 0.0–0.2)
Basos: 1 %
EOS (ABSOLUTE): 0.2 10*3/uL (ref 0.0–0.4)
Eos: 3 %
Hematocrit: 43.1 % (ref 34.0–46.6)
Hemoglobin: 14.9 g/dL (ref 11.1–15.9)
Immature Grans (Abs): 0 10*3/uL (ref 0.0–0.1)
Immature Granulocytes: 1 %
Lymphocytes Absolute: 1.5 10*3/uL (ref 0.7–3.1)
Lymphs: 21 %
MCH: 34.6 pg — ABNORMAL HIGH (ref 26.6–33.0)
MCHC: 34.6 g/dL (ref 31.5–35.7)
MCV: 100 fL — ABNORMAL HIGH (ref 79–97)
Monocytes Absolute: 0.7 10*3/uL (ref 0.1–0.9)
Monocytes: 10 %
Neutrophils Absolute: 4.6 10*3/uL (ref 1.4–7.0)
Neutrophils: 64 %
Platelets: 185 10*3/uL (ref 150–450)
RBC: 4.31 x10E6/uL (ref 3.77–5.28)
RDW: 12 % (ref 11.7–15.4)
WBC: 7.2 10*3/uL (ref 3.4–10.8)

## 2022-11-06 LAB — LIPID PANEL W/O CHOL/HDL RATIO
Cholesterol, Total: 223 mg/dL — ABNORMAL HIGH (ref 100–199)
HDL: 133 mg/dL (ref >39)
LDL Chol Calc (NIH): 80 mg/dL (ref 0–99)
Triglycerides: 58 mg/dL (ref 0–149)
VLDL Cholesterol Cal: 10 mg/dL (ref 5–40)

## 2022-12-10 ENCOUNTER — Ambulatory Visit (INDEPENDENT_AMBULATORY_CARE_PROVIDER_SITE_OTHER): Payer: Medicare Other | Admitting: Nurse Practitioner

## 2022-12-10 ENCOUNTER — Encounter: Payer: Self-pay | Admitting: Nurse Practitioner

## 2022-12-10 VITALS — BP 130/78 | HR 103 | Temp 98.7°F | Wt 191.2 lb

## 2022-12-10 DIAGNOSIS — L0291 Cutaneous abscess, unspecified: Secondary | ICD-10-CM

## 2022-12-10 MED ORDER — SULFAMETHOXAZOLE-TRIMETHOPRIM 800-160 MG PO TABS: 1.0000 | ORAL_TABLET | Freq: Two times a day (BID) | ORAL | 0 refills | Status: DC

## 2022-12-10 NOTE — Progress Notes (Signed)
BP 130/78   Pulse (!) 103   Temp 98.7 F (37.1 C) (Oral)   Wt 191 lb 3.2 oz (86.7 kg)   SpO2 98%   BMI 33.87 kg/m    Subjective:    Patient ID: Maria Davies, female    DOB: 1941-02-02, 82 y.o.   MRN: [id number]  HPI: Maria Davies is a 82 y.o. female  Chief Complaint  Patient presents with   Cyst    Pt states she has first noticed a cyst or boil at the top of her back on the right side for the last week. States she has not noticed the area draining or anything. States she only feels the area when pressure is applied to it.    Patient states she noticed a cyst or a boil on the top of her back last week.  Denies any fever or drainage.  Not using anything for pain at this time.     Relevant past medical, surgical, family and social history reviewed and updated as indicated. Interim medical history since our last visit reviewed. Allergies and medications reviewed and updated.  Review of Systems  Skin:        Cyst on upper right shoulder    Per HPI unless specifically indicated above     Objective:    BP 130/78   Pulse (!) 103   Temp 98.7 F (37.1 C) (Oral)   Wt 191 lb 3.2 oz (86.7 kg)   SpO2 98%   BMI 33.87 kg/m   Wt Readings from Last 3 Encounters:  12/10/22 191 lb 3.2 oz (86.7 kg)  11/05/22 191 lb 12.8 oz (87 kg)  09/13/22 190 lb (86.2 kg)    Physical Exam Vitals and nursing note reviewed.  Constitutional:      General: She is not in acute distress.    Appearance: Normal appearance. She is normal weight. She is not ill-appearing, toxic-appearing or diaphoretic.  HENT:     Head: Normocephalic.     Right Ear: External ear normal.     Left Ear: External ear normal.     Nose: Nose normal.     Mouth/Throat:     Mouth: Mucous membranes are moist.     Pharynx: Oropharynx is clear.  Eyes:     General:        Right eye: No discharge.        Left eye: No discharge.     Extraocular Movements: Extraocular movements intact.     Conjunctiva/sclera:  Conjunctivae normal.     Pupils: Pupils are equal, round, and reactive to light.  Cardiovascular:     Rate and Rhythm: Normal rate and regular rhythm.     Heart sounds: No murmur heard. Pulmonary:     Effort: Pulmonary effort is normal. No respiratory distress.     Breath sounds: Normal breath sounds. No wheezing or rales.  Musculoskeletal:     Cervical back: Normal range of motion and neck supple.  Skin:    General: Skin is warm and dry.     Capillary Refill: Capillary refill takes less than 2 seconds.       Neurological:     General: No focal deficit present.     Mental Status: She is alert and oriented to person, place, and time. Mental status is at baseline.  Psychiatric:        Mood and Affect: Mood normal.        Behavior: Behavior normal.  Thought Content: Thought content normal.        Judgment: Judgment normal.     Results for orders placed or performed in visit on 11/05/22  Comprehensive metabolic panel  Result Value Ref Range   Glucose 108 (H) 70 - 99 mg/dL   BUN 7 (L) 8 - 27 mg/dL   Creatinine, Ser 1.61 0.57 - 1.00 mg/dL   eGFR 89 >09 UE/AVW/0.98   BUN/Creatinine Ratio 11 (L) 12 - 28   Sodium 136 134 - 144 mmol/L   Potassium 3.5 3.5 - 5.2 mmol/L   Chloride 93 (L) 96 - 106 mmol/L   CO2 27 20 - 29 mmol/L   Calcium 10.0 8.7 - 10.3 mg/dL   Total Protein 6.6 6.0 - 8.5 g/dL   Albumin 4.4 3.8 - 4.8 g/dL   Globulin, Total 2.2 1.5 - 4.5 g/dL   Albumin/Globulin Ratio 2.0 1.2 - 2.2   Bilirubin Total 0.7 0.0 - 1.2 mg/dL   Alkaline Phosphatase 90 44 - 121 IU/L   AST 30 0 - 40 IU/L   ALT 21 0 - 32 IU/L  CBC with Differential/Platelet  Result Value Ref Range   WBC 7.2 3.4 - 10.8 x10E3/uL   RBC 4.31 3.77 - 5.28 x10E6/uL   Hemoglobin 14.9 11.1 - 15.9 g/dL   Hematocrit 11.9 14.7 - 46.6 %   MCV 100 (H) 79 - 97 fL   MCH 34.6 (H) 26.6 - 33.0 pg   MCHC 34.6 31.5 - 35.7 g/dL   RDW 82.9 56.2 - 13.0 %   Platelets 185 150 - 450 x10E3/uL   Neutrophils 64 Not Estab. %    Lymphs 21 Not Estab. %   Monocytes 10 Not Estab. %   Eos 3 Not Estab. %   Basos 1 Not Estab. %   Neutrophils Absolute 4.6 1.4 - 7.0 x10E3/uL   Lymphocytes Absolute 1.5 0.7 - 3.1 x10E3/uL   Monocytes Absolute 0.7 0.1 - 0.9 x10E3/uL   EOS (ABSOLUTE) 0.2 0.0 - 0.4 x10E3/uL   Basophils Absolute 0.1 0.0 - 0.2 x10E3/uL   Immature Granulocytes 1 Not Estab. %   Immature Grans (Abs) 0.0 0.0 - 0.1 x10E3/uL  Lipid Panel w/o Chol/HDL Ratio  Result Value Ref Range   Cholesterol, Total 223 (H) 100 - 199 mg/dL   Triglycerides 58 0 - 149 mg/dL   HDL 865 >78 mg/dL   VLDL Cholesterol Cal 10 5 - 40 mg/dL   LDL Chol Calc (NIH) 80 0 - 99 mg/dL      Assessment & Plan:   Problem List Items Addressed This Visit   None Visit Diagnoses     Abscess    -  Primary   Complete course of antibiotics. Recommend warm compress and OTC pain management.        Follow up plan: Return if symptoms worsen or fail to improve.

## 2023-01-31 DIAGNOSIS — E0590 Thyrotoxicosis, unspecified without thyrotoxic crisis or storm: Secondary | ICD-10-CM | POA: Diagnosis not present

## 2023-02-07 ENCOUNTER — Emergency Department: Payer: Medicare Other

## 2023-02-07 ENCOUNTER — Encounter: Payer: Self-pay | Admitting: Intensive Care

## 2023-02-07 ENCOUNTER — Other Ambulatory Visit: Payer: Self-pay

## 2023-02-07 ENCOUNTER — Inpatient Hospital Stay
Admission: EM | Admit: 2023-02-07 | Discharge: 2023-02-14 | DRG: 483 | Disposition: A | Discharge status: Skilled Nursing Facility | Payer: Medicare Other | Attending: Internal Medicine | Admitting: Internal Medicine

## 2023-02-07 DIAGNOSIS — Z85038 Personal history of other malignant neoplasm of large intestine: Secondary | ICD-10-CM | POA: Diagnosis not present

## 2023-02-07 DIAGNOSIS — W010XXA Fall on same level from slipping, tripping and stumbling without subsequent striking against object, initial encounter: Secondary | ICD-10-CM | POA: Diagnosis present

## 2023-02-07 DIAGNOSIS — Z923 Personal history of irradiation: Secondary | ICD-10-CM | POA: Diagnosis not present

## 2023-02-07 DIAGNOSIS — M8580 Other specified disorders of bone density and structure, unspecified site: Secondary | ICD-10-CM | POA: Diagnosis present

## 2023-02-07 DIAGNOSIS — Z736 Limitation of activities due to disability: Secondary | ICD-10-CM | POA: Diagnosis not present

## 2023-02-07 DIAGNOSIS — S4291XA Fracture of right shoulder girdle, part unspecified, initial encounter for closed fracture: Secondary | ICD-10-CM | POA: Diagnosis present

## 2023-02-07 DIAGNOSIS — Z86010 Personal history of colonic polyps: Secondary | ICD-10-CM

## 2023-02-07 DIAGNOSIS — R69 Illness, unspecified: Secondary | ICD-10-CM | POA: Diagnosis not present

## 2023-02-07 DIAGNOSIS — Y9301 Activity, walking, marching and hiking: Secondary | ICD-10-CM | POA: Diagnosis present

## 2023-02-07 DIAGNOSIS — Z79899 Other long term (current) drug therapy: Secondary | ICD-10-CM

## 2023-02-07 DIAGNOSIS — E876 Hypokalemia: Secondary | ICD-10-CM | POA: Diagnosis not present

## 2023-02-07 DIAGNOSIS — M6259 Muscle wasting and atrophy, not elsewhere classified, multiple sites: Secondary | ICD-10-CM | POA: Diagnosis not present

## 2023-02-07 DIAGNOSIS — Z9011 Acquired absence of right breast and nipple: Secondary | ICD-10-CM

## 2023-02-07 DIAGNOSIS — I4439 Other atrioventricular block: Secondary | ICD-10-CM | POA: Diagnosis not present

## 2023-02-07 DIAGNOSIS — S42291A Other displaced fracture of upper end of right humerus, initial encounter for closed fracture: Secondary | ICD-10-CM | POA: Diagnosis not present

## 2023-02-07 DIAGNOSIS — Z6839 Body mass index (BMI) 39.0-39.9, adult: Secondary | ICD-10-CM | POA: Diagnosis not present

## 2023-02-07 DIAGNOSIS — M62838 Other muscle spasm: Secondary | ICD-10-CM | POA: Diagnosis not present

## 2023-02-07 DIAGNOSIS — Z953 Presence of xenogenic heart valve: Secondary | ICD-10-CM

## 2023-02-07 DIAGNOSIS — Y92009 Unspecified place in unspecified non-institutional (private) residence as the place of occurrence of the external cause: Secondary | ICD-10-CM | POA: Diagnosis not present

## 2023-02-07 DIAGNOSIS — S42211A Unspecified displaced fracture of surgical neck of right humerus, initial encounter for closed fracture: Principal | ICD-10-CM | POA: Diagnosis present

## 2023-02-07 DIAGNOSIS — Z96611 Presence of right artificial shoulder joint: Secondary | ICD-10-CM | POA: Diagnosis not present

## 2023-02-07 DIAGNOSIS — I5032 Chronic diastolic (congestive) heart failure: Secondary | ICD-10-CM | POA: Diagnosis not present

## 2023-02-07 DIAGNOSIS — Z85828 Personal history of other malignant neoplasm of skin: Secondary | ICD-10-CM | POA: Diagnosis not present

## 2023-02-07 DIAGNOSIS — Z7982 Long term (current) use of aspirin: Secondary | ICD-10-CM

## 2023-02-07 DIAGNOSIS — Z950 Presence of cardiac pacemaker: Secondary | ICD-10-CM

## 2023-02-07 DIAGNOSIS — M84421A Pathological fracture, right humerus, initial encounter for fracture: Secondary | ICD-10-CM | POA: Diagnosis not present

## 2023-02-07 DIAGNOSIS — Z7901 Long term (current) use of anticoagulants: Secondary | ICD-10-CM | POA: Diagnosis not present

## 2023-02-07 DIAGNOSIS — S42201A Unspecified fracture of upper end of right humerus, initial encounter for closed fracture: Secondary | ICD-10-CM

## 2023-02-07 DIAGNOSIS — E60 Dietary zinc deficiency: Secondary | ICD-10-CM | POA: Diagnosis not present

## 2023-02-07 DIAGNOSIS — M25511 Pain in right shoulder: Secondary | ICD-10-CM | POA: Diagnosis present

## 2023-02-07 DIAGNOSIS — M11261 Other chondrocalcinosis, right knee: Secondary | ICD-10-CM | POA: Diagnosis not present

## 2023-02-07 DIAGNOSIS — E039 Hypothyroidism, unspecified: Secondary | ICD-10-CM | POA: Diagnosis not present

## 2023-02-07 DIAGNOSIS — I700 Atherosclerosis of aorta: Secondary | ICD-10-CM | POA: Diagnosis present

## 2023-02-07 DIAGNOSIS — I350 Nonrheumatic aortic (valve) stenosis: Secondary | ICD-10-CM | POA: Diagnosis not present

## 2023-02-07 DIAGNOSIS — R29898 Other symptoms and signs involving the musculoskeletal system: Secondary | ICD-10-CM | POA: Diagnosis not present

## 2023-02-07 DIAGNOSIS — Z8249 Family history of ischemic heart disease and other diseases of the circulatory system: Secondary | ICD-10-CM | POA: Diagnosis not present

## 2023-02-07 DIAGNOSIS — Z789 Other specified health status: Secondary | ICD-10-CM | POA: Diagnosis not present

## 2023-02-07 DIAGNOSIS — E669 Obesity, unspecified: Secondary | ICD-10-CM | POA: Diagnosis present

## 2023-02-07 DIAGNOSIS — Z9049 Acquired absence of other specified parts of digestive tract: Secondary | ICD-10-CM

## 2023-02-07 DIAGNOSIS — I5033 Acute on chronic diastolic (congestive) heart failure: Secondary | ICD-10-CM | POA: Diagnosis not present

## 2023-02-07 DIAGNOSIS — R7303 Prediabetes: Secondary | ICD-10-CM | POA: Diagnosis present

## 2023-02-07 DIAGNOSIS — G610 Guillain-Barre syndrome: Secondary | ICD-10-CM | POA: Diagnosis not present

## 2023-02-07 DIAGNOSIS — W19XXXA Unspecified fall, initial encounter: Secondary | ICD-10-CM | POA: Diagnosis not present

## 2023-02-07 DIAGNOSIS — R52 Pain, unspecified: Secondary | ICD-10-CM | POA: Diagnosis not present

## 2023-02-07 DIAGNOSIS — S8011XA Contusion of right lower leg, initial encounter: Secondary | ICD-10-CM | POA: Diagnosis present

## 2023-02-07 DIAGNOSIS — Z803 Family history of malignant neoplasm of breast: Secondary | ICD-10-CM | POA: Diagnosis not present

## 2023-02-07 DIAGNOSIS — G8918 Other acute postprocedural pain: Secondary | ICD-10-CM | POA: Diagnosis not present

## 2023-02-07 DIAGNOSIS — K5900 Constipation, unspecified: Secondary | ICD-10-CM | POA: Diagnosis not present

## 2023-02-07 DIAGNOSIS — K588 Other irritable bowel syndrome: Secondary | ICD-10-CM | POA: Diagnosis not present

## 2023-02-07 DIAGNOSIS — R112 Nausea with vomiting, unspecified: Secondary | ICD-10-CM | POA: Diagnosis not present

## 2023-02-07 DIAGNOSIS — E785 Hyperlipidemia, unspecified: Secondary | ICD-10-CM | POA: Diagnosis present

## 2023-02-07 DIAGNOSIS — Z471 Aftercare following joint replacement surgery: Secondary | ICD-10-CM | POA: Diagnosis not present

## 2023-02-07 DIAGNOSIS — I2510 Atherosclerotic heart disease of native coronary artery without angina pectoris: Secondary | ICD-10-CM | POA: Diagnosis not present

## 2023-02-07 DIAGNOSIS — I442 Atrioventricular block, complete: Secondary | ICD-10-CM | POA: Diagnosis not present

## 2023-02-07 DIAGNOSIS — R011 Cardiac murmur, unspecified: Secondary | ICD-10-CM | POA: Diagnosis present

## 2023-02-07 DIAGNOSIS — Z853 Personal history of malignant neoplasm of breast: Secondary | ICD-10-CM | POA: Diagnosis not present

## 2023-02-07 DIAGNOSIS — R197 Diarrhea, unspecified: Secondary | ICD-10-CM | POA: Diagnosis present

## 2023-02-07 DIAGNOSIS — G4700 Insomnia, unspecified: Secondary | ICD-10-CM | POA: Diagnosis not present

## 2023-02-07 DIAGNOSIS — D120 Benign neoplasm of cecum: Secondary | ICD-10-CM | POA: Diagnosis not present

## 2023-02-07 DIAGNOSIS — D519 Vitamin B12 deficiency anemia, unspecified: Secondary | ICD-10-CM | POA: Diagnosis not present

## 2023-02-07 DIAGNOSIS — E0590 Thyrotoxicosis, unspecified without thyrotoxic crisis or storm: Secondary | ICD-10-CM | POA: Diagnosis not present

## 2023-02-07 DIAGNOSIS — E569 Vitamin deficiency, unspecified: Secondary | ICD-10-CM | POA: Diagnosis not present

## 2023-02-07 DIAGNOSIS — I10 Essential (primary) hypertension: Secondary | ICD-10-CM | POA: Diagnosis present

## 2023-02-07 DIAGNOSIS — M1711 Unilateral primary osteoarthritis, right knee: Secondary | ICD-10-CM | POA: Diagnosis not present

## 2023-02-07 DIAGNOSIS — Z961 Presence of intraocular lens: Secondary | ICD-10-CM | POA: Diagnosis present

## 2023-02-07 DIAGNOSIS — Z952 Presence of prosthetic heart valve: Secondary | ICD-10-CM | POA: Diagnosis not present

## 2023-02-07 DIAGNOSIS — I509 Heart failure, unspecified: Secondary | ICD-10-CM | POA: Diagnosis not present

## 2023-02-07 DIAGNOSIS — I110 Hypertensive heart disease with heart failure: Secondary | ICD-10-CM | POA: Diagnosis present

## 2023-02-07 DIAGNOSIS — R2681 Unsteadiness on feet: Secondary | ICD-10-CM | POA: Diagnosis not present

## 2023-02-07 DIAGNOSIS — R278 Other lack of coordination: Secondary | ICD-10-CM | POA: Diagnosis not present

## 2023-02-07 DIAGNOSIS — Z9109 Other allergy status, other than to drugs and biological substances: Secondary | ICD-10-CM

## 2023-02-07 DIAGNOSIS — Z9842 Cataract extraction status, left eye: Secondary | ICD-10-CM

## 2023-02-07 DIAGNOSIS — F1090 Alcohol use, unspecified, uncomplicated: Secondary | ICD-10-CM | POA: Diagnosis present

## 2023-02-07 DIAGNOSIS — S43001A Unspecified subluxation of right shoulder joint, initial encounter: Secondary | ICD-10-CM | POA: Diagnosis not present

## 2023-02-07 DIAGNOSIS — Z9841 Cataract extraction status, right eye: Secondary | ICD-10-CM

## 2023-02-07 DIAGNOSIS — R531 Weakness: Secondary | ICD-10-CM | POA: Diagnosis not present

## 2023-02-07 DIAGNOSIS — E041 Nontoxic single thyroid nodule: Secondary | ICD-10-CM | POA: Diagnosis present

## 2023-02-07 DIAGNOSIS — M79604 Pain in right leg: Secondary | ICD-10-CM | POA: Diagnosis not present

## 2023-02-07 DIAGNOSIS — E871 Hypo-osmolality and hyponatremia: Secondary | ICD-10-CM | POA: Diagnosis not present

## 2023-02-07 HISTORY — DX: Unspecified fracture of upper end of right humerus, initial encounter for closed fracture: S42.201A

## 2023-02-07 HISTORY — DX: Fracture of right shoulder girdle, part unspecified, initial encounter for closed fracture: S42.91XA

## 2023-02-07 LAB — APTT: aPTT: 26 seconds (ref 24–36)

## 2023-02-07 LAB — CBC WITH DIFFERENTIAL/PLATELET
Abs Immature Granulocytes: 0.09 10*3/uL — ABNORMAL HIGH (ref 0.00–0.07)
Basophils Absolute: 0.1 10*3/uL (ref 0.0–0.1)
Basophils Relative: 1 %
Eosinophils Absolute: 0.1 10*3/uL (ref 0.0–0.5)
Eosinophils Relative: 1 %
HCT: 34.5 % — ABNORMAL LOW (ref 36.0–46.0)
Hemoglobin: 12.8 g/dL (ref 12.0–15.0)
Immature Granulocytes: 1 %
Lymphocytes Relative: 13 %
Lymphs Abs: 1.3 10*3/uL (ref 0.7–4.0)
MCH: 36.8 pg — ABNORMAL HIGH (ref 26.0–34.0)
MCHC: 37.1 g/dL — ABNORMAL HIGH (ref 30.0–36.0)
MCV: 99.1 fL (ref 80.0–100.0)
Monocytes Absolute: 0.9 10*3/uL (ref 0.1–1.0)
Monocytes Relative: 10 %
Neutro Abs: 7.1 10*3/uL (ref 1.7–7.7)
Neutrophils Relative %: 74 %
Platelets: 155 10*3/uL (ref 150–400)
RBC: 3.48 MIL/uL — ABNORMAL LOW (ref 3.87–5.11)
RDW: 13.9 % (ref 11.5–15.5)
WBC: 9.5 10*3/uL (ref 4.0–10.5)
nRBC: 0 % (ref 0.0–0.2)

## 2023-02-07 LAB — COMPREHENSIVE METABOLIC PANEL
ALT: 22 U/L (ref 0–44)
AST: 32 U/L (ref 15–41)
Albumin: 3.6 g/dL (ref 3.5–5.0)
Alkaline Phosphatase: 68 U/L (ref 38–126)
Anion gap: 16 — ABNORMAL HIGH (ref 5–15)
BUN: 13 mg/dL (ref 8–23)
CO2: 24 mmol/L (ref 22–32)
Calcium: 8.6 mg/dL — ABNORMAL LOW (ref 8.9–10.3)
Chloride: 92 mmol/L — ABNORMAL LOW (ref 98–111)
Creatinine, Ser: 0.63 mg/dL (ref 0.44–1.00)
GFR, Estimated: >60 mL/min (ref >60)
Glucose, Bld: 177 mg/dL — ABNORMAL HIGH (ref 70–99)
Potassium: 2.3 mmol/L — CL (ref 3.5–5.1)
Sodium: 132 mmol/L — ABNORMAL LOW (ref 135–145)
Total Bilirubin: 1.3 mg/dL — ABNORMAL HIGH (ref 0.3–1.2)
Total Protein: 6.4 g/dL — ABNORMAL LOW (ref 6.5–8.1)

## 2023-02-07 LAB — PROTIME-INR
INR: 1 (ref 0.8–1.2)
Prothrombin Time: 13.6 seconds (ref 11.4–15.2)

## 2023-02-07 LAB — PHOSPHORUS: Phosphorus: 2.7 mg/dL (ref 2.5–4.6)

## 2023-02-07 LAB — TYPE AND SCREEN
ABO/RH(D): O NEG
Antibody Screen: NEGATIVE

## 2023-02-07 LAB — MAGNESIUM: Magnesium: 1.8 mg/dL (ref 1.7–2.4)

## 2023-02-07 LAB — BRAIN NATRIURETIC PEPTIDE: B Natriuretic Peptide: 63.5 pg/mL (ref 0.0–100.0)

## 2023-02-07 MED ORDER — VITAMIN B-12 1000 MCG PO TABS
1000.0000 ug | ORAL_TABLET | Freq: Every day | ORAL | Status: DC
  Administered 2023-02-08 – 2023-02-14 (×6): 1000 ug via ORAL
  Filled 2023-02-07 (×7): qty 1

## 2023-02-07 MED ORDER — METHOCARBAMOL 500 MG PO TABS
500.0000 mg | ORAL_TABLET | Freq: Three times a day (TID) | ORAL | Status: DC | PRN
  Administered 2023-02-07 – 2023-02-13 (×3): 500 mg via ORAL
  Filled 2023-02-07 (×3): qty 1

## 2023-02-07 MED ORDER — THIAMINE HCL 100 MG/ML IJ SOLN
100.0000 mg | Freq: Every day | INTRAMUSCULAR | Status: DC
  Administered 2023-02-10: 100 mg via INTRAVENOUS
  Filled 2023-02-07 (×4): qty 2

## 2023-02-07 MED ORDER — LORAZEPAM 1 MG PO TABS: 1.0000 mg | ORAL_TABLET | ORAL | Status: DC | PRN

## 2023-02-07 MED ORDER — ASPIRIN 81 MG PO TBEC
81.0000 mg | DELAYED_RELEASE_TABLET | Freq: Every day | ORAL | Status: DC
  Administered 2023-02-08 – 2023-02-14 (×6): 81 mg via ORAL
  Filled 2023-02-07 (×7): qty 1

## 2023-02-07 MED ORDER — FOLIC ACID 1 MG PO TABS
1.0000 mg | ORAL_TABLET | Freq: Every day | ORAL | Status: DC
  Administered 2023-02-08 – 2023-02-11 (×4): 1 mg via ORAL
  Filled 2023-02-07 (×4): qty 1

## 2023-02-07 MED ORDER — ADULT MULTIVITAMIN W/MINERALS CH
1.0000 | ORAL_TABLET | Freq: Every day | ORAL | Status: DC
  Administered 2023-02-08 – 2023-02-14 (×6): 1 via ORAL
  Filled 2023-02-07 (×7): qty 1

## 2023-02-07 MED ORDER — HYDRALAZINE HCL 20 MG/ML IJ SOLN: 5.0000 mg | INTRAMUSCULAR | Status: DC | PRN

## 2023-02-07 MED ORDER — LIDOCAINE 5 % EX PTCH
1.0000 | MEDICATED_PATCH | CUTANEOUS | Status: DC
  Administered 2023-02-07 – 2023-02-11 (×5): 1 via TRANSDERMAL
  Filled 2023-02-07 (×5): qty 1

## 2023-02-07 MED ORDER — POTASSIUM CHLORIDE 10 MEQ/100ML IV SOLN
10.0000 meq | INTRAVENOUS | Status: AC
  Administered 2023-02-07 (×2): 10 meq via INTRAVENOUS
  Filled 2023-02-07 (×2): qty 100

## 2023-02-07 MED ORDER — AMLODIPINE BESYLATE 5 MG PO TABS
5.0000 mg | ORAL_TABLET | Freq: Every day | ORAL | Status: DC
  Administered 2023-02-08 – 2023-02-14 (×6): 5 mg via ORAL
  Filled 2023-02-07 (×7): qty 1

## 2023-02-07 MED ORDER — LORAZEPAM 2 MG/ML IJ SOLN: 0.0000 mg | Freq: Two times a day (BID) | INTRAMUSCULAR | Status: DC

## 2023-02-07 MED ORDER — MORPHINE SULFATE (PF) 2 MG/ML IV SOLN: 1.0000 mg | INTRAVENOUS | Status: DC | PRN

## 2023-02-07 MED ORDER — THIAMINE MONONITRATE 100 MG PO TABS
100.0000 mg | ORAL_TABLET | Freq: Every day | ORAL | Status: DC
  Administered 2023-02-07 – 2023-02-11 (×4): 100 mg via ORAL
  Filled 2023-02-07 (×5): qty 1

## 2023-02-07 MED ORDER — METOPROLOL SUCCINATE ER 50 MG PO TB24
50.0000 mg | ORAL_TABLET | Freq: Every day | ORAL | Status: DC
  Administered 2023-02-08 – 2023-02-14 (×7): 50 mg via ORAL
  Filled 2023-02-07 (×7): qty 1

## 2023-02-07 MED ORDER — LORAZEPAM 2 MG/ML IJ SOLN: 0.0000 mg | Freq: Four times a day (QID) | INTRAMUSCULAR | Status: DC

## 2023-02-07 MED ORDER — FENTANYL CITRATE PF 50 MCG/ML IJ SOSY
50.0000 ug | PREFILLED_SYRINGE | INTRAMUSCULAR | Status: AC | PRN
  Administered 2023-02-07 (×2): 50 ug via INTRAVENOUS
  Filled 2023-02-07 (×2): qty 1

## 2023-02-07 MED ORDER — OXYCODONE-ACETAMINOPHEN 5-325 MG PO TABS
1.0000 | ORAL_TABLET | ORAL | Status: DC | PRN
  Administered 2023-02-07 – 2023-02-11 (×17): 1 via ORAL
  Filled 2023-02-07 (×17): qty 1

## 2023-02-07 MED ORDER — METHIMAZOLE 5 MG PO TABS
5.0000 mg | ORAL_TABLET | Freq: Every day | ORAL | Status: DC
  Administered 2023-02-08 – 2023-02-14 (×6): 5 mg via ORAL
  Filled 2023-02-07 (×7): qty 1

## 2023-02-07 MED ORDER — ACETAMINOPHEN 325 MG PO TABS
650.0000 mg | ORAL_TABLET | Freq: Four times a day (QID) | ORAL | Status: DC | PRN
  Administered 2023-02-12: 650 mg via ORAL
  Filled 2023-02-07: qty 2

## 2023-02-07 MED ORDER — POTASSIUM CHLORIDE CRYS ER 20 MEQ PO TBCR
40.0000 meq | EXTENDED_RELEASE_TABLET | Freq: Once | ORAL | Status: AC
  Administered 2023-02-07: 40 meq via ORAL
  Filled 2023-02-07: qty 2

## 2023-02-07 MED ORDER — ONDANSETRON HCL 4 MG/2ML IJ SOLN: 4.0000 mg | Freq: Three times a day (TID) | INTRAMUSCULAR | Status: DC | PRN

## 2023-02-07 MED ORDER — BENAZEPRIL HCL 20 MG PO TABS
20.0000 mg | ORAL_TABLET | Freq: Every day | ORAL | Status: DC
  Administered 2023-02-08 – 2023-02-14 (×6): 20 mg via ORAL
  Filled 2023-02-07 (×7): qty 1

## 2023-02-07 MED ORDER — ONDANSETRON HCL 4 MG/2ML IJ SOLN
4.0000 mg | Freq: Once | INTRAMUSCULAR | Status: AC
  Administered 2023-02-07: 4 mg via INTRAVENOUS
  Filled 2023-02-07: qty 2

## 2023-02-07 MED ORDER — MULTI-VITAMINS PO TABS: 1.0000 | ORAL_TABLET | Freq: Every day | ORAL | Status: DC

## 2023-02-07 MED ORDER — LORAZEPAM 2 MG/ML IJ SOLN: 1.0000 mg | INTRAMUSCULAR | Status: DC | PRN

## 2023-02-07 NOTE — ED Triage Notes (Signed)
Patient c/o pain in right shoulder and right lower leg pain. Had mechanical fall at home.

## 2023-02-07 NOTE — ED Notes (Addendum)
Critical Result: Potassium 2.3  Siadecki, MD made aware

## 2023-02-07 NOTE — Consult Note (Signed)
ORTHOPAEDIC CONSULTATION  REQUESTING PHYSICIAN: Phineas Semen, MD  Chief Complaint:   Right proximal humerus head split fracture Right tibia contusion  History of Present Illness: Maria Davies is a 82 y.o. female who presented to the emergency room today after she tripped and fell while trying to get to the bathroom at home due to some chronic neuropathy in her feet.  She reports she landed on her right shoulder and her right shin.  She reports she had difficulty getting up after the fall and was brought to the emergency room for evaluation mostly due to severe right shoulder pain.  Patient denies any pre-existing right shoulder pain or any hip pain at this time.  Patient denies any numbness in her arm fingers or hand but some tingling over her lateral shoulder.  Patient denies any chest pain fevers chills or any head strike or loss of consciousness.  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   a.) stage IIIb; b.) RIGHT colon mass Bx (+) for invasive moderately differentiated adenocarcinoma (pT3 pN1a)   Anemia    Aortic atherosclerosis (HCC)    Aortic stenosis    a.) TTE 08/17/2016: EF >55%, mild AS (MPG 29.5); b.) TTE 08/25/2018: EF >55%; mod AS (MPG 42.3); c.) TTE 06/17/2020: EF >55%; severe AS (MPG 47.2); d.) s/p TAVR 08/31/2020 at [facility] - 23 mm Edwards Sapien 3 Ultra valve placed via a RIGHT percutaneous femoral approach --> complicated by intra/postoperative CHB requiring TVP and ultimate PPM placement   Arthritis    Basal cell carcinoma (BCC) of back    Bilateral leg edema    Breast cancer, right (HCC) 2011   a.) stage Ia IMC (ER/PR +, HER2/neu -); b.) lumpectomy + XRT in 2011; c.) completed 5 years AI therapy (anastrozole) in 11/2014   Cervical spinal stenosis    Cervical spondylosis    Chemotherapy induced nausea and vomiting    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   a.)  TVP placed intraoperatively during TAVR secondary to development of CHB; b.) Medtronic PPM placed 09/03/2020 for postoperative continued postoperative CHB   Coronary artery disease    a.) RHC 08/11/2020: no sig CAD   DDD (degenerative disc disease), cervical    Diastolic dysfunction    a.) TTE 08/17/2016: EF >55%, mild LVH, mild LAE, triv panvalvular regurg, G1DD; b.) TTE 08/25/2018: EF >55%, mod LVH, mild LAE, triv AR/TR/PR, mild MR, G1dd; c.) TTE 08/11/2020: EF >55%, mild LVHH, mod LAE, triv MR/TR   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   Heart murmur    History of bilateral cataract extraction    Hyperplastic colon polyp    Hypertension    Hyperthyroidism    Neuropathy    Osteopenia    PONV (postoperative nausea and vomiting)    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   a.) TVP placed intraoperatively during TAVR secondary to development of CHB; b.) Medtronic PPM placed 09/03/2020 for postoperative continued postoperative CHB   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   a.) s/p TAVR 08/31/2020 at [facility] - 23 mm Edwards Sapien 3 Ultra valve placed via a RIGHT percutaneous femoral approach --> complicated by intra/postoperative CHB requiring TVP and ultimate PPM placement   Thyroid nodule 01/25/2022   a.) CT C-spine 01/25/2022: 22 mm right thyroid nodule   Past Surgical History:  Procedure Laterality Date   BACK SURGERY     BREAST EXCISIONAL BIOPSY Right 03/2009   BREAST EXCISIONAL BIOPSY Bilateral  NEG   CATARACT EXTRACTION W/ INTRAOCULAR LENS  IMPLANT, BILATERAL     CHOLECYSTECTOMY     COLONOSCOPY WITH PROPOFOL N/A 10/19/2019   Procedure: COLONOSCOPY WITH PROPOFOL-attempted, unable to perform poor prep;  Surgeon: Midge Minium, MD;  Location: [facility];  Service: Endoscopy;  Laterality: N/A;  priority 3   COLONOSCOPY WITH PROPOFOL N/A 10/20/2019   Procedure: COLONOSCOPY WITH PROPOFOL;  Surgeon: Midge Minium, MD;  Location: [facility];   Service: Endoscopy;  Laterality: N/A;   COLONOSCOPY WITH PROPOFOL N/A 11/16/2021   Procedure: COLONOSCOPY WITH PROPOFOL;  Surgeon: Midge Minium, MD;  Location: [facility] ENDOSCOPY;  Service: Endoscopy;  Laterality: N/A;   ESOPHAGEAL DILATION  10/19/2019   Procedure: ESOPHAGEAL DILATION;  Surgeon: Midge Minium, MD;  Location: [facility];  Service: Endoscopy;;   ESOPHAGOGASTRODUODENOSCOPY (EGD) WITH PROPOFOL N/A 10/19/2019   Procedure: ESOPHAGOGASTRODUODENOSCOPY (EGD) WITH PROPOFOL;  Surgeon: Midge Minium, MD;  Location: [facility];  Service: Endoscopy;  Laterality: N/A;   FLEXIBLE SIGMOIDOSCOPY N/A 03/20/2022   Procedure: FLEXIBLE SIGMOIDOSCOPY;  Surgeon: Midge Minium, MD;  Location: [facility] ENDOSCOPY;  Service: Endoscopy;  Laterality: N/A;   HEMORRHOID SURGERY     MASTECTOMY, PARTIAL Right 2010   PACEMAKER INSERTION N/A 09/03/2020   Procedure: PACEMAKER WITH LEFT BUNDLE LEAD (MEDTRONIC); Location: [facility]; Surgeon: Constance Haw, MD   [facility] REMOVAL  2021   PORTACATH PLACEMENT Right 11/23/2019   Procedure: INSERTION PORT-A-CATH;  Surgeon: Campbell Lerner, MD;  Location: [facility] ORS;  Service: General;  Laterality: Right;   RECTAL EXAM UNDER ANESTHESIA N/A 07/18/2022   Procedure: RECTAL EXAM UNDER ANESTHESIA;  Surgeon: Campbell Lerner, MD;  Location: [facility] ORS;  Service: General;  Laterality: N/A;   RIGHT COLECTOMY Right 10/2019   Procedure: ROBOTIC ASSISTED TIGHT HEMICOLECTOMY   RIGHT/LEFT HEART CATH AND CORONARY ANGIOGRAPHY Right 08/11/2020   Procedure: RIGHT/LEFT HEART CATH AND CORONARY ANGIOGRAPHY; Location: [facility]; Surgeon: Manson Allan, MD   TRANSCATHETER AORTIC VALVE REPLACEMENT, TRANSFEMORAL Right 08/31/2020   Procedure: TRANSCATHETER AORTIC VALVE REPLACEMENT, RIGHT TRANSFEMORAL; Location: [facility]; Surgeon: Clent Jacks, MD   TUMOR EXCISION N/A 04/11/2022   Procedure: TUMOR EXCISION RECTAL;  Surgeon: Campbell Lerner, MD;  Location: [facility] ORS;  Service: General;  Laterality: N/A;    Social History   Socioeconomic History   Marital status: Widowed    Spouse name: Not on file   Number of children: Not on file   Years of education: Not on file   Highest education level: Not on file  Occupational History   Occupation: retired   Tobacco Use   Smoking status: Never    Passive exposure: Never   Smokeless tobacco: Never  Vaping Use   Vaping status: Never Used  Substance and Sexual Activity   Alcohol use: Not Currently    Alcohol/week: 21.0 standard drinks of alcohol    Types: 21 Shots of liquor per week   Drug use: No   Sexual activity: Not Currently  Other Topics Concern   Not on file  Social History Narrative   Lives alone   Social Determinants of Health   Financial Resource Strain: Low Risk  (09/10/2022)   Overall Financial Resource Strain (CARDIA)    Difficulty of Paying Living Expenses: Not hard at all  Food Insecurity: No Food Insecurity (09/10/2022)   Hunger Vital Sign    Worried About Running Out of Food in the Last Year: Never true    Ran Out of Food in the Last Year: Never true  Transportation Needs: No Transportation Needs (09/10/2022)  PRAPARE - Administrator, Civil Service (Medical): No    Lack of Transportation (Non-Medical): No  Physical Activity: Insufficiently Active (09/10/2022)   Exercise Vital Sign    Days of Exercise per Week: 3 days    Minutes of Exercise per Session: 30 min  Stress: No Stress Concern Present (09/10/2022)   [employer] of Occupational Health - Occupational Stress Questionnaire    Feeling of Stress : Not at all  Social Connections: Moderately Isolated (09/10/2022)   Social Connection and Isolation Panel [NHANES]    Frequency of Communication with Friends and Family: More than three times a week    Frequency of Social Gatherings with Friends and Family: More than three times a week    Attends Religious Services: Never    Database administrator or Organizations: Yes    Attends Hospital doctor: More than 4 times per year    Marital Status: Widowed   Family History  Problem Relation Age of Onset   Cancer Mother    Heart disease Father    Breast cancer Paternal Aunt 41   Allergies  Allergen Reactions   Tape     Prefers Paper tape be used   Prior to Admission medications   Medication Sig Start Date End Date Taking? Authorizing Provider  amLODipine (NORVASC) 5 MG tablet Take 1 tablet (5 mg total) by mouth every morning. 11/05/22   Olevia Perches P, DO  aspirin EC 81 MG tablet Take 81 mg by mouth daily.    [provider]  benazepril (LOTENSIN) 20 MG tablet Take 1 tablet (20 mg total) by mouth every morning. 11/05/22   Olevia Perches P, DO  hydrochlorothiazide (MICROZIDE) 12.5 MG capsule Take 1 capsule (12.5 mg total) by mouth every morning. 11/05/22   Laural Benes, Megan P, DO  ibuprofen (ADVIL) 200 MG tablet Take 200 mg by mouth every 6 (six) hours as needed.    [provider]  methimazole (TAPAZOLE) 5 MG tablet Take by mouth. 10/04/22 10/04/23  [provider]  metoprolol succinate (TOPROL-XL) 50 MG 24 hr tablet TAKE 1 TABLET BY MOUTH ONCE DAILY WITH OR IMMEDIATELY FOLLOWING A MEAL 11/05/22   Johnson, Megan P, DO  Multiple Vitamin (MULTI-VITAMINS) TABS Take 1 tablet by mouth daily.     [provider]  Probiotic Product (PROBIOTIC ADVANCED PO) Take 1 capsule by mouth daily. When taking antibiotics pt will take 2 capsules instead of 1, normally just takes 1 per day otherwise    [provider]  silver sulfADIAZINE (SILVADENE) 1 % cream Apply 1 application topically daily. 07/22/17   Steele Sizer, MD  Sodium Hyaluronate, oral, (HYALURONIC ACID PO) Take 1 tablet by mouth daily.    [provider]  sulfamethoxazole-trimethoprim (BACTRIM DS) 800-160 MG tablet Take 1 tablet by mouth 2 (two) times daily. 12/10/22   Larae Grooms, NP  vitamin B-12 (CYANOCOBALAMIN) 1000 MCG tablet Take 1,000 mcg by mouth daily.    [provider]  Zinc Acetate, Oral, (ZINC ACETATE PO) Take by mouth.    [provider]   DG Shoulder Right  Result Date: 02/07/2023 CLINICAL DATA:  Fall.  Right shoulder pain. EXAM: RIGHT SHOULDER - 2+ VIEW COMPARISON:  None Available. FINDINGS: There is diffuse decreased bone mineralization. There is an acute, comminuted, displaced fracture of the right humeral head and surgical neck. A dominant medial and anterior humeral head fracture component measuring up to 5.8 cm in craniocaudal dimension appears displaced anteriorly and  medially approximately 3 cm. There are comminuted fracture fragments within the lateral aspect of the humeral head with mild posterior displacement. Mild acromioclavicular joint space narrowing and peripheral osteophytosis. Partial visualization of AICD wires. A likely aortic valve stent graft is again seen. IMPRESSION: Acute, comminuted, fractures of the right humeral head and surgical neck. A dominant anterior humeral head fracture component is displaced anteriorly and medially. Electronically Signed   By: Neita Garnet M.D.   On: 02/07/2023 16:39   DG Tibia/Fibula Right  Result Date: 02/07/2023 CLINICAL DATA:  Fall with pain in the lower leg history of neuropathy EXAM: RIGHT TIBIA AND FIBULA - 2 VIEW COMPARISON:  None Available. FINDINGS: No acute fracture or malalignment. Moderate degenerative changes at the knee with chondrocalcinosis. Bulky superior patellar osteophyte versus calcified loose body. IMPRESSION: No acute osseous abnormality. Electronically Signed   By: Jasmine Pang M.D.   On: 02/07/2023 15:58    Positive ROS: All other systems have been reviewed and were otherwise negative with the exception of those mentioned in the HPI and as above.  Physical Exam: General:  Alert, no acute distress Psychiatric:  Patient is competent for consent with normal mood and affect   Cardiovascular:  No pedal edema Respiratory:  No wheezing, non-labored breathing GI:   Abdomen is soft and non-tender Skin:  No lesions in the area of chief complaint Neurologic:  Sensation intact distally Lymphatic:  No axillary or cervical lymphadenopathy  Orthopedic Exam:  Right upper extremity exam Skin intact and sensation intact over the shoulder and entire arm Tender to palpation over the proximal humerus No tenderness to palpation over the elbow forearm distal humerus, or wrist or hand Range of motion intact without pain to the elbow wrist and fingers Fully neurovascularly intact with a good radial pulse AIN/PIN/U/M/R/Ax nerve distributions intact and functioning Compartments all soft  Secondary survey No tenderness to palpation over other bony prominences in the lower extremities or bilateral upper extremities No pain with logroll or simulated axial loading of the bilateral lower extremity Some mild tenderness over the right anterior shin Able to straight leg raise bilaterally without pain All compartments soft No tenderness to palpation over the cervical or thoracic spine, no bony step-off Motor grossly intact throughout, no focal deficits Sensation grossly intact throughout, no focal deficits Good distal pulses and capillary refill on all extremities   X-rays:  X-rays of the right shoulder reviewed by myself which show a proximal humerus head split fracture with anterior displacement of a good amount of the humeral head.  Agree with radiology interpretation. X-rays of the right fibula and tibia images reviewed by myself show no fractures or dislocations.  Degenerative changes noted to the knee.  With radiologist interpretation.  Assessment: Right proximal humerus head split fracture  Plan: I reviewed the clinical and radiographic findings with the patient and discussed the case with my partner Dr. Allena Katz.  The patient is neurovascularly intact at this time however is having some significant electrolyte abnormalities.  Given the nature of the injury and her  electrolyte abnormalities our recommendation would be admission to the medical team with correction of her electrolyte abnormalities over the next few days with a planned surgical fixation of the right shoulder with likely reverse total shoulder arthroplasty on Monday, 02/11/2023.  I discussed this as a treatment option with the patient and her daughter and they both agree with the above plan.  In the meantime we will keep her nonweightbearing on the right upper extremity in a sling  and our team will follow her during her admission.  With regards to her lower extremities based on my evaluation she may weight-bear as tolerated at this time.    Reinaldo Berber MD  Beeper #:  [phone]  02/07/2023 5:50 PM

## 2023-02-07 NOTE — ED Provider Notes (Signed)
[facility] Provider Note    Event Date/Time   First MD Initiated Contact with Patient 02/07/23 1710     (approximate)   History   Fall   HPI  Maria Davies is a 82 y.o. female who presents to the emergency department today after a fall.  The patient states that she was rushing to get inside because she felt like she was going to have diarrhea.  She caught her toe on a change in the level of the floors and fell onto her right side. Complaining of pain in her right elbow and right lower leg. Denies hitting her head.     Physical Exam   Triage Vital Signs: ED Triage Vitals  Encounter Vitals Group     BP 02/07/23 1454 (!) 106/57     Systolic BP Percentile --      Diastolic BP Percentile --      Pulse Rate 02/07/23 1454 78     Resp 02/07/23 1454 18     Temp 02/07/23 1454 97.8 F (36.6 C)     Temp Source 02/07/23 1454 Oral     SpO2 02/07/23 1454 94 %     Weight 02/07/23 1449 178 lb (80.7 kg)     Height 02/07/23 1449 5\' 3"  (1.6 m)     Head Circumference --      Peak Flow --      Pain Score 02/07/23 1449 7     Pain Loc --      Pain Education --      Exclude from Growth Chart --     Most recent vital signs: Vitals:   02/07/23 1454  BP: (!) 106/57  Pulse: 78  Resp: 18  Temp: 97.8 F (36.6 C)  SpO2: 94%   General: Awake, alert, oriented. CV:  Good peripheral perfusion. Regular rate and rhythm. Resp:  Normal effort. Lungs clear. Abd:  No distention. Non tender. Ext:  Tenderness to manipulation of the right shoulder. NV intact distally. Right lower leg with large bruise over shin  ED Results / Procedures / Treatments   Labs (all labs ordered are listed, but only abnormal results are displayed) Labs Reviewed  CBC WITH DIFFERENTIAL/PLATELET - Abnormal; Notable for the following components:      Result Value   RBC 3.48 (*)    HCT 34.5 (*)    MCH 36.8 (*)    MCHC 37.1 (*)    Abs Immature Granulocytes 0.09 (*)    All other components  within normal limits  COMPREHENSIVE METABOLIC PANEL - Abnormal; Notable for the following components:   Sodium 132 (*)    Potassium 2.3 (*)    Chloride 92 (*)    Glucose, Bld 177 (*)    Calcium 8.6 (*)    Total Protein 6.4 (*)    Total Bilirubin 1.3 (*)    Anion gap 16 (*)    All other components within normal limits  TYPE AND SCREEN     EKG  I, Phineas Semen, attending physician, personally viewed and interpreted this EKG  EKG Time: 1615 Rate: 85 Rhythm: atrial sensed v-paced rhythm Axis: left axis deviation Intervals: qtc 540 QRS: wide ST changes: no st elevation Impression: abnormal ekg    RADIOLOGY I independently interpreted and visualized the right shoulder. My interpretation: comminuted fracture of the humeral head Radiology interpretation:  IMPRESSION:  Acute, comminuted, fractures of the right humeral head and surgical  neck. A dominant anterior humeral head fracture component is  displaced anteriorly and medially.    I independently interpreted and visualized the right tib fib. My interpretation: no fracture Radiology interpretation:  IMPRESSION:  No acute osseous abnormality.      PROCEDURES:  Critical Care performed: No    MEDICATIONS ORDERED IN ED: Medications  ondansetron (ZOFRAN) injection 4 mg (4 mg Intravenous Given 02/07/23 1503)  fentaNYL (SUBLIMAZE) injection 50 mcg (50 mcg Intravenous Given 02/07/23 1808)  potassium chloride SA (KLOR-CON M) CR tablet 40 mEq (40 mEq Oral Given 02/07/23 1614)     IMPRESSION / MDM / ASSESSMENT AND PLAN / ED COURSE  I reviewed the triage vital signs and the nursing notes.                              Differential diagnosis includes, but is not limited to, fracture, dislocation, contusion  Patient's presentation is most consistent with acute presentation with potential threat to life or bodily function.   The patient is on the cardiac monitor to evaluate for evidence of arrhythmia and/or  significant heart rate changes.  Patient presented to the emergency department today because of concerns for right shoulder and right lower leg pain after mechanical fall.  X-ray of the right shoulder is concerning for fracture.  Right tib-fib without osseous abnormality.  Dr. Audelia Acton with orthopedics was consulted.  Patient will require surgery on the shoulder.  Blood work was notable for hypokalemia.  Patient does state that she recently started hydrochlorothiazide.  Do wonder if this is contributing to the hypokalemia.  Discussed with Dr. Clyde Lundborg with the hospitalist service who will plan on admission.      FINAL CLINICAL IMPRESSION(S) / ED DIAGNOSES   Final diagnoses:  Hypokalemia  Closed fracture of right shoulder, initial encounter     Note:  This document was prepared using Dragon voice recognition software and may include unintentional dictation errors.    Phineas Semen, MD 02/07/23 [phone]

## 2023-02-07 NOTE — ED Triage Notes (Signed)
Arrived by EMS From home for mechanical fall. C/o right shoulder pain and right hip pain  History neuropathy   fentanyl given by EMS  EMS vitals: 121/60 b/p 75HR 20G Left AC

## 2023-02-07 NOTE — H&P (Signed)
History and Physical    Maria Davies RUE:[id number] DOB: [birth year] DOA: 02/07/2023  Referring MD/NP/PA:   PCP: Dorcas Carrow, DO   Patient coming from:  The patient is coming from home.     Chief Complaint: fall and pain in right shoulder  HPI: Maria Davies is a 82 y.o. 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   Pt states that she fell accidentally when she was walking to the bathroom at a large time.  She injured her right shoulder and right lower leg, causing a larege bruises in the right leg shin area.  Patient is strongly denies head or neck injury.  She does not want to CT scan of head and neck at this moment.  No loss of consciousness.  Her right shoulder pain is constant, sharp, severe, radiating to the right side of neck, aggravated by movement.  Patient denies chest pain, cough and SOB.  Patient denies nausea, vomiting, diarrhea or abdominal pain.  Denies symptoms of UTI.  Patient states that she drinks liquor almost every day, 3-4 small drinks each time.  Data reviewed independently and ED Course: pt was found to have WBC 9.5, potassium 2.3, magnesium 1.8, phosphorus 2.7, BNP 63.8, temperature normal, blood pressure 106/57, heart rate 78, RR 18, oxygen saturation 94% on room air.  X-ray of right tibia/fibula negative for bony fracture.  X-ray of right shoulder showed fracture.  Patient is admitted to telemetry bed as inpatient.  Dr. Melton Alar of cardiology is consulted.  Dr. Ike Bene of Ortho is consulted.   CT scan of right shoulder: 1. Acute comminuted intra-articular right humeral head and neck fracture. The humeral head is displaced anteriorly and  impacted on the anterior glenoid. 2. Hematoma within the rotator cuff muscles about the insertion on the humeral head.  X-ray of right shoulder: Acute, comminuted, fractures of the right humeral head and surgical neck. A dominant anterior humeral head fracture component is displaced anteriorly and medially.    EKG: I have personally reviewed.  Paced rhythm, QTc 540   Review of Systems:   General: no fevers, chills, no body weight gain,  has fatigue HEENT: no blurry vision, hearing changes or sore throat Respiratory: no dyspnea, coughing, wheezing CV: no chest pain, no palpitations GI: no nausea, vomiting, abdominal pain, diarrhea, constipation GU: no dysuria, burning on urination, increased urinary frequency, hematuria  Ext: has trace leg edema Neuro: no unilateral weakness, numbness, or tingling, no vision change or hearing loss. Has fall Skin: no rash, no skin tear. MSK: has pain in right shoulder and right lower leg Heme: No easy bruising.  Travel history: No recent long distant travel.   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   a.) stage IIIb; b.) RIGHT colon mass Bx (+) for invasive moderately differentiated adenocarcinoma (pT3 pN1a)   Anemia    Aortic atherosclerosis (HCC)    Aortic stenosis    a.) TTE 08/17/2016: EF >55%, mild AS (MPG 29.5); b.) TTE 08/25/2018: EF >55%; mod AS (MPG 42.3); c.) TTE 06/17/2020: EF >55%; severe AS (MPG 47.2);  d.) s/p TAVR 08/31/2020 at [facility] - 23 mm Edwards Sapien 3 Ultra valve placed via a RIGHT percutaneous femoral approach --> complicated by intra/postoperative CHB requiring TVP and ultimate PPM placement   Arthritis    Basal cell carcinoma (BCC) of back    Bilateral leg edema    Breast cancer, right (HCC) 2011   a.) stage Ia IMC (ER/PR +, HER2/neu -); b.) lumpectomy + XRT in 2011; c.) completed 5 years AI  therapy (anastrozole) in 11/2014   Cervical spinal stenosis    Cervical spondylosis    Chemotherapy induced nausea and vomiting    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   a.) TVP placed intraoperatively during TAVR secondary to development of CHB; b.) Medtronic PPM placed 09/03/2020 for postoperative continued postoperative CHB   Coronary artery disease    a.) RHC 08/11/2020: no sig CAD   DDD (degenerative disc disease), cervical    Diastolic dysfunction    a.) TTE 08/17/2016: EF >55%, mild LVH, mild LAE, triv panvalvular regurg, G1DD; b.) TTE 08/25/2018: EF >55%, mod LVH, mild LAE, triv AR/TR/PR, mild MR, G1dd; c.) TTE 08/11/2020: EF >55%, mild LVHH, mod LAE, triv MR/TR   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   Heart murmur    History of bilateral cataract extraction    Hyperplastic colon polyp    Hypertension    Hyperthyroidism    Neuropathy    Osteopenia    PONV (postoperative nausea and vomiting)    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   a.) TVP placed intraoperatively during TAVR secondary to development of CHB; b.) Medtronic PPM placed 09/03/2020 for postoperative continued postoperative CHB   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   a.) s/p TAVR 08/31/2020 at [facility] - 23 mm Edwards Sapien 3 Ultra valve placed via a RIGHT percutaneous femoral approach --> complicated by intra/postoperative CHB requiring TVP and ultimate PPM placement   Thyroid nodule 01/25/2022   a.) CT C-spine 01/25/2022: 22 mm right thyroid nodule    Past Surgical History:  Procedure Laterality Date   BACK SURGERY     BREAST EXCISIONAL BIOPSY Right 03/2009   BREAST EXCISIONAL BIOPSY Bilateral    NEG   CATARACT EXTRACTION W/ INTRAOCULAR LENS  IMPLANT, BILATERAL     CHOLECYSTECTOMY     COLONOSCOPY WITH PROPOFOL N/A 10/19/2019   Procedure: COLONOSCOPY WITH PROPOFOL-attempted, unable to perform poor prep;  Surgeon: Midge Minium, MD;  Location: [facility];  Service:  Endoscopy;  Laterality: N/A;  priority 3   COLONOSCOPY WITH PROPOFOL N/A 10/20/2019   Procedure: COLONOSCOPY WITH PROPOFOL;  Surgeon: Midge Minium, MD;  Location: [facility] END[facility]COPY;  Service: Endoscopy;  Laterality: N/A;   COLONOSCOPY WITH PROPOFOL N/A 11/16/2021   Procedure: COLONOSCOPY WITH PROPOFOL;  Surgeon: Midge Minium, MD;  Location: [facility] ENDOSCOPY;  Service: Endoscopy;  Laterality: N/A;   ESOPHAGEAL DILATION  10/19/2019   Procedure: ESOPHAGEAL DILATION;  Surgeon: Midge Minium, MD;  Location: [facility];  Service: Endoscopy;;   ESOPHAGOGASTRODUODENOSCOPY (EGD) WITH PROPOFOL N/A 10/19/2019   Procedure: ESOPHAGOGASTRODUODENOSCOPY (EGD) WITH PROPOFOL;  Surgeon: Midge Minium, MD;  Location: [facility];  Service: Endoscopy;  Laterality: N/A;   FLEXIBLE SIGMOIDOSCOPY N/A 03/20/2022   Procedure: FLEXIBLE SIGMOIDOSCOPY;  Surgeon: Midge Minium, MD;  Location: [facility] ENDOSCOPY;  Service: Endoscopy;  Laterality: N/A;   HEMORRHOID SURGERY     MASTECTOMY, PARTIAL Right 2010   PACEMAKER INSERTION N/A 09/03/2020   Procedure: PACEMAKER WITH LEFT BUNDLE LEAD (MEDTRONIC); Location: [facility]; Surgeon: Constance Haw, MD  PORT-A-CATH REMOVAL  2021   PORTACATH PLACEMENT Right 11/23/2019   Procedure: INSERTION PORT-A-CATH;  Surgeon: Campbell Lerner, MD;  Location: [facility] OR[facility];  Service: General;  Laterality: Right;   RECTAL EXAM UNDER ANESTHESIA N/A 07/18/2022   Procedure: RECTAL EXAM UNDER ANESTHESIA;  Surgeon: Campbell Lerner, MD;  Location: [facility] OR[facility];  Service: General;  Laterality: N/A;   RIGHT COLECTOMY Right 10/2019   Procedure: ROBOTIC ASSISTED TIGHT HEMICOLECTOMY   RIGHT/LEFT HEART CATH AND CORONARY ANGIOGRAPHY Right 08/11/2020   Procedure: RIGHT/LEFT HEART CATH AND CORONARY ANGIOGRAPHY; Location: [facility]; Surgeon: Manson Allan, MD   TRANSCATHETER AORTIC VALVE REPLACEMENT, TRANSFEMORAL Right 08/31/2020   Procedure: TRANSCATHETER AORTIC VALVE REPLACEMENT, RIGHT TRANSFEMORAL; Location: [facility]; Surgeon:  Clent Jacks, MD   TUMOR EXCISION N/A 04/11/2022   Procedure: TUMOR EXCISION RECTAL;  Surgeon: Campbell Lerner, MD;  Location: [facility] OR[facility];  Service: General;  Laterality: N/A;    Social History:  reports that she has never smoked. She has never been exposed to tobacco smoke. She has never used smokeless tobacco. She reports that she does not currently use alcohol after a past usage of about 21.0 standard drinks of alcohol per week. She reports that she does not use drugs.  Family History:  Family History  Problem Relation Age of Onset   Cancer Mother    Heart disease Father    Breast cancer Paternal Aunt 76     Prior to Admission medications   Medication Sig Start Date End Date Taking? Authorizing Provider  amLODipine (NORVASC) 5 MG tablet Take 1 tablet (5 mg total) by mouth every morning. 11/05/22   Olevia Perches P, DO  aspirin EC 81 MG tablet Take 81 mg by mouth daily.    [provider]  benazepril (LOTENSIN) 20 MG tablet Take 1 tablet (20 mg total) by mouth every morning. 11/05/22   Olevia Perches P, DO  hydrochlorothiazide (MICROZIDE) 12.5 MG capsule Take 1 capsule (12.5 mg total) by mouth every morning. 11/05/22   Laural Benes, Megan P, DO  ibuprofen (ADVIL) 200 MG tablet Take 200 mg by mouth every 6 (six) hours as needed.    [provider]  methimazole (TAPAZOLE) 5 MG tablet Take by mouth. 10/04/22 10/04/23  [provider]  metoprolol succinate (TOPROL-XL) 50 MG 24 hr tablet TAKE 1 TABLET BY MOUTH ONCE DAILY WITH OR IMMEDIATELY FOLLOWING A MEAL 11/05/22   Johnson, Megan P, DO  Multiple Vitamin (MULTI-VITAMINS) TABS Take 1 tablet by mouth daily.     [provider]  Probiotic Product (PROBIOTIC ADVANCED PO) Take 1 capsule by mouth daily. When taking antibiotics pt will take 2 capsules instead of 1, normally just takes 1 per day otherwise    [provider]  silver sulfADIAZINE (SILVADENE) 1 % cream Apply 1 application topically daily. 07/22/17    Steele Sizer, MD  Sodium Hyaluronate, oral, (HYALURONIC ACID PO) Take 1 tablet by mouth daily.    [provider]  sulfamethoxazole-trimethoprim (BACTRIM DS) 800-160 MG tablet Take 1 tablet by mouth 2 (two) times daily. 12/10/22   Larae Grooms, NP  vitamin B-12 (CYANOCOBALAMIN) 1000 MCG tablet Take 1,000 mcg by mouth daily.    [provider]  Zinc Acetate, Oral, (ZINC ACETATE PO) Take by mouth.    [provider]    Physical Exam: Vitals:   02/07/23 1449 02/07/23 1454 02/07/23 2006 02/08/23 0038  BP:  (!) 106/57  (!) 145/63  Pulse:  78  84  Resp:  18  17  Temp:  97.8 F (  36.6 C) 98 F (36.7 C) 98 F (36.7 C)  TempSrc:  Oral Oral Oral  SpO2:  94%  100%  Weight: 80.7 kg   80.3 kg  Height: 5\' 3"  (1.6 m)   5\' 3"  (1.6 m)   General: Not in acute distress HEENT:       Eyes: PERRL, EOMI, no jaundice       ENT: No discharge from the ears and nose, no pharynx injection, no tonsillar enlargement.        Neck: No JVD, no bruit, no mass felt. Heme: No neck lymph node enlargement. Cardiac: S1/S2, RRR, has systolic murmurs, No gallops or rubs. Respiratory: No rales, wheezing, rhonchi or rubs. GI: Soft, nondistended, nontender, no rebound pain, no organomegaly, BS present. GU: No hematuria Ext: has trace leg edema bilaterally. 1+DP/PT pulse bilaterally. Musculoskeletal: has tenderness in right shoulder and right lower leg Skin: No rashes. Has bruise in right lower leg Neuro: Alert, oriented X3, cranial nerves II-XII grossly intact, moves all extremities Psych: Patient is not psychotic, no suicidal or hemocidal ideation.  Labs on Admission: I have personally reviewed following labs and imaging studies  CBC: Recent Labs  Lab 02/07/23 1459  WBC 9.5  NEUTROABS 7.1  HGB 12.8  HCT 34.5*  MCV 99.1  PLT 155   Basic Metabolic Panel: Recent Labs  Lab 02/07/23 1459  NA 132*  K 2.3*  CL 92*  CO2 24  GLUCOSE 177*  BUN 13  CREATININE 0.63  CALCIUM  8.6*  MG 1.8  PHOS 2.7   GFR: Estimated Creatinine Clearance: 56.3 mL/min (by C-G formula based on SCr of 0.63 mg/dL). Liver Function Tests: Recent Labs  Lab 02/07/23 1459  AST 32  ALT 22  ALKPHOS 68  BILITOT 1.3*  PROT 6.4*  ALBUMIN 3.6   No results for input(s): "LIPASE", "AMYLASE" in the last 168 hours. No results for input(s): "AMMONIA" in the last 168 hours. Coagulation Profile: Recent Labs  Lab 02/07/23 1459  INR 1.0   Cardiac Enzymes: No results for input(s): "CKTOTAL", "CKMB", "CKMBINDEX", "TROPONINI" in the last 168 hours. BNP (last 3 results) No results for input(s): "PROBNP" in the last 8760 hours. HbA1C: No results for input(s): "HGBA1C" in the last 72 hours. CBG: No results for input(s): "GLUCAP" in the last 168 hours. Lipid Profile: No results for input(s): "CHOL", "HDL", "LDLCALC", "TRIG", "CHOLHDL", "LDLDIRECT" in the last 72 hours. Thyroid Function Tests: No results for input(s): "TSH", "T4TOTAL", "FREET4", "T3FREE", "THYROIDAB" in the last 72 hours. Anemia Panel: No results for input(s): "VITAMINB12", "FOLATE", "FERRITIN", "TIBC", "IRON", "RETICCTPCT" in the last 72 hours. Urine analysis:    Component Value Date/Time   APPEARANCEUR Clear 05/07/2022 0948   GLUCOSEU Negative 05/07/2022 0948   BILIRUBINUR Negative 05/07/2022 0948   PROTEINUR Negative 05/07/2022 0948   NITRITE Negative 05/07/2022 0948   LEUKOCYTESUR Trace (A) 05/07/2022 0948   Sepsis Labs: @LABRCNTIP (procalcitonin:4,lacticidven:4) )No results found for this or any previous visit (from the past 240 hour(s)).   Radiological Exams on Admission: CT SHOULDER RIGHT WO CONTRAST  Result Date: 02/07/2023 CLINICAL DATA:  Shoulder trauma with fracture of humerus or scapula. After mechanical fall. EXAM: CT OF THE UPPER RIGHT EXTREMITY WITHOUT CONTRAST TECHNIQUE: Multidetector CT imaging of the upper right extremity was performed according to the standard protocol. RADIATION DOSE REDUCTION:  This exam was performed according to the departmental dose-optimization program which includes automated exposure control, adjustment of the mA and/or kV according to patient size and/or use of iterative reconstruction technique. 3-dimensional  CT images were rendered by post-processing of the original CT data on an acquisition workstation. The 3-dimensional CT images are concordant with the findings CT report for this study. COMPARISON:  Shoulder radiographs 02/07/2023 FINDINGS: Bones/Joint/Cartilage Acute comminuted intra-articular fracture of the neck of the right humerus. There is superior and lateral displacement of the dominant fragment. The humeral head is displaced anteriorly and impacted on the anterior glenoid. No definite glenoid fracture. Ligaments Suboptimally assessed by CT. Muscles and Tendons Hematoma within the rotator cuff muscles at near their insertions. Soft tissues Partially visualized pacemaker leads and TAVR. IMPRESSION: 1. Acute comminuted intra-articular right humeral head and neck fracture. The humeral head is displaced anteriorly and impacted on the anterior glenoid. 2. Hematoma within the rotator cuff muscles about the insertion on the humeral head. Electronically Signed   By: Minerva Fester M.D.   On: 02/07/2023 19:49   CT 3D RECON AT SCANNER  Result Date: 02/07/2023 CLINICAL DATA:  Shoulder trauma with fracture of humerus or scapula. After mechanical fall. EXAM: CT OF THE UPPER RIGHT EXTREMITY WITHOUT CONTRAST TECHNIQUE: Multidetector CT imaging of the upper right extremity was performed according to the standard protocol. RADIATION DOSE REDUCTION: This exam was performed according to the departmental dose-optimization program which includes automated exposure control, adjustment of the mA and/or kV according to patient size and/or use of iterative reconstruction technique. 3-dimensional CT images were rendered by post-processing of the original CT data on an acquisition  workstation. The 3-dimensional CT images are concordant with the findings CT report for this study. COMPARISON:  Shoulder radiographs 02/07/2023 FINDINGS: Bones/Joint/Cartilage Acute comminuted intra-articular fracture of the neck of the right humerus. There is superior and lateral displacement of the dominant fragment. The humeral head is displaced anteriorly and impacted on the anterior glenoid. No definite glenoid fracture. Ligaments Suboptimally assessed by CT. Muscles and Tendons Hematoma within the rotator cuff muscles at near their insertions. Soft tissues Partially visualized pacemaker leads and TAVR. IMPRESSION: 1. Acute comminuted intra-articular right humeral head and neck fracture. The humeral head is displaced anteriorly and impacted on the anterior glenoid. 2. Hematoma within the rotator cuff muscles about the insertion on the humeral head. Electronically Signed   By: Minerva Fester M.D.   On: 02/07/2023 19:49   DG Shoulder Right  Result Date: 02/07/2023 CLINICAL DATA:  Fall.  Right shoulder pain. EXAM: RIGHT SHOULDER - 2+ VIEW COMPARISON:  None Available. FINDINGS: There is diffuse decreased bone mineralization. There is an acute, comminuted, displaced fracture of the right humeral head and surgical neck. A dominant medial and anterior humeral head fracture component measuring up to 5.8 cm in craniocaudal dimension appears displaced anteriorly and medially approximately 3 cm. There are comminuted fracture fragments within the lateral aspect of the humeral head with mild posterior displacement. Mild acromioclavicular joint space narrowing and peripheral osteophytosis. Partial visualization of AICD wires. A likely aortic valve stent graft is again seen. IMPRESSION: Acute, comminuted, fractures of the right humeral head and surgical neck. A dominant anterior humeral head fracture component is displaced anteriorly and medially. Electronically Signed   By: Neita Garnet M.D.   On: 02/07/2023 16:39    DG Tibia/Fibula Right  Result Date: 02/07/2023 CLINICAL DATA:  Fall with pain in the lower leg history of neuropathy EXAM: RIGHT TIBIA AND FIBULA - 2 VIEW COMPARISON:  None Available. FINDINGS: No acute fracture or malalignment. Moderate degenerative changes at the knee with chondrocalcinosis. Bulky superior patellar osteophyte versus calcified loose body. IMPRESSION: No acute osseous abnormality. Electronically  Signed   By: Jasmine Pang M.D.   On: 02/07/2023 15:58      Assessment/Plan Principal Problem:   Closed fracture of right shoulder Active Problems:   Fall at home, initial encounter   Hypertension   Hyperthyroidism   Chronic diastolic CHF (congestive heart failure) (HCC)   Hypokalemia   Alcohol use   Obesity (BMI 30-39.9)   Assessment and Plan:  Closed fracture of right shoulder: Ct showed acute comminuted intra-articular right humeral head and neck fracture. The humeral head is displaced anteriorly and impacted on the anterior glenoid. Dr. Ike Bene of ortho is consulted, planning to do surgery on 8/26.  Patient has significant cardiac issues, I consulted Dr. Melton Alar of cardiology for presurgical clearance.  - will admit to Med-surg bed - Pain control: prn morphine, percocet and tyleno - When necessary Zofran for nausea - Robaxin for muscle spasm - Lidoderm patch for pain - type and cross - INR/PTT  Fall at home, initial encounter -PT/OT when able to (not ordered now)  Hypertension -IV hydralazine as needed -Amlodipine, Lotensin, metoprolol  Hyperthyroidism -Methimazole  Chronic diastolic CHF (congestive heart failure) (HCC): 2D echo on 12/11/2021 showed EF> 55% with grade 1 diastolic dysfunction.  Patient has trace leg edema, no shortness of breath, BNP normal 63.8, no CHF exacerbation. -Volume status closely  Hypokalemia: Potassium 2.3, magnesium 1.8, phosphorus 2.7 -Repleted potassium  Alcohol use -Did counseling about importance of quitting alcohol  use -CIWA protocol  Obesity (BMI 30-39.9): Body weight 83.3 kg, BMI 39.36 -Encourage losing weight, -Exercise and healthy diet    DVT ppx: SCD  Code Status: Full code   Family Communication:   Yes, patient's daughter    at bed side.      Disposition Plan:  Anticipate discharge back to previous environment  Consults called:  Dr. Melton Alar of cardiology is consulted.  Dr. Ike Bene of Ortho is consulted.  Admission status and Level of care: Telemetry Medical:   as inpt        Dispo: The patient is from: Home              Anticipated d/c is to:  Likely needs rehab              Anticipated d/c date is: > 3 days              Patient currently is not medically stable to d/c.    Severity of Illness:  The appropriate patient status for this patient is INPATIENT. Inpatient status is judged to be reasonable and necessary in order to provide the required intensity of service to ensure the patient's safety. The patient's presenting symptoms, physical exam findings, and initial radiographic and laboratory data in the context of their chronic comorbidities is felt to place them at high risk for further clinical deterioration. Furthermore, it is not anticipated that the patient will be medically stable for discharge from the hospital within 2 midnights of admission.   * I certify that at the point of admission it is my clinical judgment that the patient will require inpatient hospital care spanning beyond 2 midnights from the point of admission due to high intensity of service, high risk for further deterioration and high frequency of surveillance required.*       Date of Service 02/08/2023    Lorretta Harp [facility]   If 7PM-7AM, please contact night-coverage [email or web address] 02/08/2023, 1:21 AM

## 2023-02-08 ENCOUNTER — Observation Stay: Admit: 2023-02-08 | Payer: Medicare Other

## 2023-02-08 DIAGNOSIS — I10 Essential (primary) hypertension: Secondary | ICD-10-CM | POA: Diagnosis not present

## 2023-02-08 DIAGNOSIS — S4291XA Fracture of right shoulder girdle, part unspecified, initial encounter for closed fracture: Secondary | ICD-10-CM | POA: Diagnosis not present

## 2023-02-08 DIAGNOSIS — I4439 Other atrioventricular block: Secondary | ICD-10-CM | POA: Diagnosis not present

## 2023-02-08 DIAGNOSIS — Z952 Presence of prosthetic heart valve: Secondary | ICD-10-CM | POA: Diagnosis not present

## 2023-02-08 DIAGNOSIS — I5032 Chronic diastolic (congestive) heart failure: Secondary | ICD-10-CM | POA: Diagnosis not present

## 2023-02-08 DIAGNOSIS — E871 Hypo-osmolality and hyponatremia: Secondary | ICD-10-CM | POA: Diagnosis present

## 2023-02-08 DIAGNOSIS — I350 Nonrheumatic aortic (valve) stenosis: Secondary | ICD-10-CM | POA: Diagnosis not present

## 2023-02-08 DIAGNOSIS — Z950 Presence of cardiac pacemaker: Secondary | ICD-10-CM | POA: Diagnosis not present

## 2023-02-08 DIAGNOSIS — E876 Hypokalemia: Secondary | ICD-10-CM | POA: Diagnosis not present

## 2023-02-08 LAB — BASIC METABOLIC PANEL
Anion gap: 10 (ref 5–15)
BUN: 11 mg/dL (ref 8–23)
CO2: 27 mmol/L (ref 22–32)
Calcium: 8.4 mg/dL — ABNORMAL LOW (ref 8.9–10.3)
Chloride: 94 mmol/L — ABNORMAL LOW (ref 98–111)
Creatinine, Ser: 0.41 mg/dL — ABNORMAL LOW (ref 0.44–1.00)
GFR, Estimated: >60 mL/min (ref >60)
Glucose, Bld: 135 mg/dL — ABNORMAL HIGH (ref 70–99)
Potassium: 3.7 mmol/L (ref 3.5–5.1)
Sodium: 131 mmol/L — ABNORMAL LOW (ref 135–145)

## 2023-02-08 LAB — CBC
HCT: 32.1 % — ABNORMAL LOW (ref 36.0–46.0)
Hemoglobin: 11.6 g/dL — ABNORMAL LOW (ref 12.0–15.0)
MCH: 36 pg — ABNORMAL HIGH (ref 26.0–34.0)
MCHC: 36.1 g/dL — ABNORMAL HIGH (ref 30.0–36.0)
MCV: 99.7 fL (ref 80.0–100.0)
Platelets: 144 10*3/uL — ABNORMAL LOW (ref 150–400)
RBC: 3.22 MIL/uL — ABNORMAL LOW (ref 3.87–5.11)
RDW: 13.9 % (ref 11.5–15.5)
WBC: 11 10*3/uL — ABNORMAL HIGH (ref 4.0–10.5)
nRBC: 0 % (ref 0.0–0.2)

## 2023-02-08 LAB — SODIUM, URINE, RANDOM: Sodium, Ur: 26 mmol/L

## 2023-02-08 LAB — OSMOLALITY, URINE: Osmolality, Ur: 228 mOsm/kg — ABNORMAL LOW (ref 300–900)

## 2023-02-08 MED ORDER — MORPHINE SULFATE (PF) 2 MG/ML IV SOLN: 2.0000 mg | INTRAVENOUS | Status: DC | PRN

## 2023-02-08 NOTE — Care Management CC44 (Signed)
Condition Code 44 Documentation Completed  Patient Details  Name: Maria Davies MRN: [id number] Date of Birth: 10-05-1940   Condition Code 44 given:  Yes Patient signature on Condition Code 44 notice:  Yes Documentation of 2 MD's agreement:  Yes Code 44 added to claim:  Yes    Arvell Pulsifer E Tobi Leinweber, LCSW 02/08/2023, 1:10 PM

## 2023-02-08 NOTE — Plan of Care (Signed)
Problem: Education: Goal: Knowledge of General Education information will improve Description Including pain rating scale, medication(s)/side effects and non-pharmacologic comfort measures Outcome: Progressing   Problem: Nutrition: Goal: Adequate nutrition will be maintained Outcome: Progressing   Problem: Coping: Goal: Level of anxiety will decrease Outcome: Progressing   Problem: Skin Integrity: Goal: Risk for impaired skin integrity will decrease Outcome: Progressing   

## 2023-02-08 NOTE — Assessment & Plan Note (Signed)
Echo this admission EF   60-65%, grade I diastolic dysfunction, mild LVH, mild MR Pt has some mild BLE edema, no dyspnea, BNP normal 63.8 Currently appears mildly hypervolemic. --Start IV Lasix 20 mg Q12H --Daily weights and I/O's to monitor volume status --Continue Toprol-XL

## 2023-02-08 NOTE — Assessment & Plan Note (Signed)
s/p dual-chamber pacemaker 08/2020

## 2023-02-08 NOTE — TOC CM/SW Note (Signed)
TOC consult for SA resources. Checked with DO. Per DO, SA resources not indicated.   TOC to continue to follow for additional needs.  Alfonso Ramus, LCSW Transitions of Care Department [phone]

## 2023-02-08 NOTE — Hospital Course (Signed)
HPI on admission 02/07/23: "Maria Davies is a 82 y.o. 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     Pt states that she fell accidentally when she was walking to the bathroom at a large time.  She injured her right shoulder and right lower leg, causing a larege bruises in the right leg shin area.  Patient is strongly denies head or neck injury.  She does not want to CT scan of head and neck at this moment.  No loss of consciousness.  Her right shoulder pain is constant, sharp, severe, radiating to the right side of neck, aggravated by movement. ..."  ED evaluation notable for: Stable vitals Labs with K 2.3, Na 132, Cl 92, glucose 177 CBC no leukocytosis, normal Hbg and platelets  Right shoulder x-ray and CT scan showed acute comminuted intra-articular right humeral head and neck fracture with humeral head displaced anteriorly and impacted on the anterior glenoid. Hematoma of rotator cuff muscles at humeral head insertion.  Pt was admitted to the hospital with Orthopedic Surgery consulted.  Cardiology also consulted for pre-operative evaluation and risk assessment.  Further hospital course and management as outlined below.

## 2023-02-08 NOTE — Assessment & Plan Note (Signed)
--  IV hydralazine as needed --Amlodipine, Lotensin, metoprolol

## 2023-02-08 NOTE — Assessment & Plan Note (Signed)
Body mass index is 34.99 kg/m. Complicates overall care and prognosis.  Recommend lifestyle modifications including physical activity and diet for weight loss and overall long-term health.

## 2023-02-08 NOTE — Assessment & Plan Note (Signed)
S/p TAVR in 08/2020

## 2023-02-08 NOTE — Assessment & Plan Note (Signed)
Resolved with replacement. K 2.3 on admission was replaced. Mg 1.8 Monitor BMP

## 2023-02-08 NOTE — Progress Notes (Signed)
Progress Note   Patient: Maria Davies [id number] DOB: 16-Apr-1941 DOA: 02/07/2023     1 DOS: the patient was seen and examined on 02/08/2023   Brief hospital course: HPI on admission 02/07/23: "Maria Davies is a 82 y.o. 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     Pt states that she fell accidentally when she was walking to the bathroom at a large time.  She injured her right shoulder and right lower leg, causing a larege bruises in the right leg shin area.  Patient is strongly denies head or neck injury.  She does not want to CT scan of head and neck at this moment.  No loss of consciousness.  Her right shoulder pain is constant, sharp, severe, radiating to the right side of neck, aggravated by movement. ..."  ED evaluation notable for: Stable vitals Labs with K 2.3, Na 132, Cl 92, glucose 177 CBC no leukocytosis, normal Hbg and platelets  Right shoulder x-ray and CT scan showed acute comminuted intra-articular right humeral head and neck fracture with humeral head displaced anteriorly and impacted on the anterior glenoid. Hematoma of rotator cuff muscles at humeral head insertion.  Pt was admitted to the hospital with Orthopedic Surgery consulted.  Cardiology also consulted for pre-operative evaluation and risk assessment.  Assessment and Plan:  * Closed fracture of right shoulder Mgmt per Orthopedic Surgery Cardiology consult for surgical clearance Echo pending. Sling and RUE non-weight-bearing Surgical fixation with likely reverse shoulder arthroplasty planned on Mon or Tues. Pain control Bowel regimen   Hyponatremia Na 132 on admission, 131  today. Pt appears mildly hypervolemic with BLE edema. --Check serum and urine osmolality, urine sodium --Repeat BMP in AM --Further management based on trend and volume status - may require diuresis  Hypokalemia K 2.3 on admission was replaced. Mg 1.8 K this AM 3.7 Monitor BMP  Fall at home, initial encounter Resulted in R shoulder fracture. Has right leg bruising as well. PT/OT when able Fall precautions  Chronic diastolic CHF (congestive heart failure) (HCC) Echo on 12/11/2021 showed EF> 55% with grade 1 diastolic dysfunction.   Pt has some mild BLE edema, no dyspnea, BNP normal 63.8 Currently appears mildly hypervolemic. 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 Started on CIWA to monitor for any withdrawal symptoms.   --No symptoms --Stop CIWA   Obesity (BMI 30-39.9) Body mass index is 34.99 kg/m. Complicates overall care and prognosis.  Recommend lifestyle modifications including physical activity and diet for weight loss and overall long-term health.  Aortic atherosclerosis (HCC) Continue ASA  Hyperthyroidism --Methimazole   Hypertension --IV hydralazine as needed --Amlodipine, Lotensin, metoprolol  Aortic stenosis, mild S/p TAVR in 08/2020        Subjective: Pt seen with daughter at bedside today.  She reports right shoulder pain, somewhat controlled with medication, severe with any movement.  Otherwise denies acute complaints.  States trying to situate herself in bed without use of right arm took a very long time.  She is requesting to have sling placed  back on for comfort.   Physical Exam: Vitals:   02/08/23 0038 02/08/23 0500 02/08/23 0814 02/08/23 1708  BP: (!) 145/63  (!) 118/59 (!) 120/59  Pulse: 84  77 80  Resp: 17  17 18    Temp: 98 F (36.7 C)  98 F (36.7 C) 98.6 F (37 C)  TempSrc: Oral  Oral   SpO2: 100%  99% 100%  Weight: 80.3 kg 89.6 kg    Height: 5\' 3"  (1.6 m)      General exam: awake, alert, no acute distress HEENT: moist mucus membranes, hearing grossly normal  Respiratory system: CTAB no wheezes, rales or rhonchi, normal respiratory effort. Cardiovascular system: normal S1/S2, RRR, no JVD, murmurs, rubs, gallops, trace+ BLE pitting edema.   Gastrointestinal system: soft, NT, ND, no HSM felt, +bowel sounds. Central nervous system: A&O x 3. no gross focal neurologic deficits, normal speech Extremities: right arm placed in sling, right distal LE ecchymosis Skin: dry, intact, normal temperature Psychiatry: normal mood, congruent affect, judgement and insight appear normal   Data Reviewed:  Notable labs ---  Na 131, K normalized 3.7, Cl 94, glucose 135, Cr 0.41, Ca 8.4, WBC 11.-, Hbg 11.6, platelets 144k  Family Communication: daughter at bedside on rounds  Disposition: Status is: Observation  -- based only on lack of medical intensity for inpatient at this time. Pt remains admitted pending cardiology evaluation and surgery for shoulder fracture.    Planned Discharge Destination: Skilled nursing facility    Time spent: 46 minutes  Author: Pennie Banter, DO 02/08/2023 5:23 PM  For on call review [email or web address].

## 2023-02-08 NOTE — Assessment & Plan Note (Signed)
Resulted in R shoulder fracture. Has right leg bruising as well. PT/OT when able Fall precautions

## 2023-02-08 NOTE — Assessment & Plan Note (Signed)
Resolved after IV hydration. Initially felt hypervolemic given BLE edema, subjective dyspnea on exertion. Na improved to 135 with IV fluids. --Hold HCTZ --Monitoring off IV fluids --Encourage PO hydration --Monitor BMP

## 2023-02-08 NOTE — Assessment & Plan Note (Signed)
Mgmt per Orthopedic Surgery Cardiology consult for surgical clearance Sling and RUE non-weight-bearing Surgical fixation with likely reverse shoulder arthroplasty planned on Mon or Tues. Pain control Bowel regimen

## 2023-02-08 NOTE — Assessment & Plan Note (Signed)
Continue ASA

## 2023-02-08 NOTE — Consult Note (Signed)
[facility] CLINIC CARDIOLOGY CONSULT NOTE       Patient ID: Maria Davies MRN: [id number] DOB/AGE: 09/05/1940 82 y.o.  Admit date: 02/07/2023 Referring Physician Dr. Lorretta Harp Primary Physician Dr. Olevia Perches Primary Cardiologist Dr. Dorothyann Peng Reason for Consultation preoperative cardiac evaluation  HPI: Maria Davies is a 82 y.o. 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  Cardiology was consulted for preoperative cardiac evaluation prior to surgical repair of R shoulder fracture planned for 02/11/2023.  Patient states she was in her usual state of health until yesterday when she was at home having lunch and needed to go to the bathroom on her way to the bathroom she tripped over a step in her house and fell on her right side.  Had significant pain to her right shoulder and bruising to her right lower leg.  Evaluation in the ED revealed acute fracture of right humeral head and neck.  Labs notable for potassium of 2.3, creatinine 0.63.  At the time of my evaluation she is resting comfortably in bed.  Only has complaints of right arm pain.  She denies any chest pain, palpitations.  She has chronic shortness of breath with exertion which she feels is stable.  She denies any dizziness or syncope that preceded her fall.   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   a.) stage IIIb; b.) RIGHT colon mass Bx (+) for invasive moderately differentiated adenocarcinoma (pT3 pN1a)   Anemia    Aortic atherosclerosis (HCC)    Aortic stenosis    a.) TTE 08/17/2016: EF >55%, mild AS (MPG 29.5); b.) TTE 08/25/2018: EF >55%; mod AS (MPG  42.3); c.) TTE 06/17/2020: EF >55%; severe AS (MPG 47.2); d.) s/p TAVR 08/31/2020 at [facility] - 23 mm Edwards Sapien 3 Ultra valve placed via a RIGHT percutaneous femoral approach --> complicated by intra/postoperative CHB requiring TVP and ultimate PPM placement   Arthritis    Basal cell carcinoma (BCC) of back    Bilateral leg edema    Breast cancer, right (HCC) 2011   a.) stage Ia IMC (ER/PR +, HER2/neu -); b.) lumpectomy + XRT in 2011; c.) completed 5 years AI therapy (anastrozole) in 11/2014   Cervical spinal stenosis    Cervical spondylosis    Chemotherapy induced nausea and vomiting    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   a.) TVP placed intraoperatively during TAVR secondary to development of CHB; b.) Medtronic PPM placed 09/03/2020 for postoperative continued postoperative CHB   Coronary artery disease    a.) RHC 08/11/2020: no sig CAD   DDD (degenerative disc disease), cervical    Diastolic dysfunction    a.) TTE 08/17/2016: EF >55%, mild LVH, mild LAE, triv panvalvular regurg, G1DD; b.) TTE 08/25/2018: EF >55%, mod LVH, mild LAE, triv AR/TR/PR, mild MR, G1dd; c.) TTE 08/11/2020: EF >55%, mild LVHH, mod LAE, triv MR/TR   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   Heart murmur    History of bilateral cataract extraction    Hyperplastic colon polyp    Hypertension    Hyperthyroidism    Neuropathy    Osteopenia    PONV (postoperative nausea and vomiting)  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   a.) TVP placed intraoperatively during TAVR secondary to development of CHB; b.) Medtronic PPM placed 09/03/2020 for postoperative continued postoperative CHB   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   a.) s/p TAVR 08/31/2020 at [facility] - 23 mm Edwards Sapien 3 Ultra valve placed via a RIGHT percutaneous femoral approach --> complicated by intra/postoperative CHB requiring TVP and ultimate PPM placement   Thyroid nodule 01/25/2022   a.) CT C-spine 01/25/2022:  22 mm right thyroid nodule    Past Surgical History:  Procedure Laterality Date   BACK SURGERY     BREAST EXCISIONAL BIOPSY Right 03/2009   BREAST EXCISIONAL BIOPSY Bilateral    NEG   CATARACT EXTRACTION W/ INTRAOCULAR LENS  IMPLANT, BILATERAL     CHOLECYSTECTOMY     COLONOSCOPY WITH PROPOFOL N/A 10/19/2019   Procedure: COLONOSCOPY WITH PROPOFOL-attempted, unable to perform poor prep;  Surgeon: Midge Minium, MD;  Location: [facility] SURGERY [facility];  Service: Endoscopy;  Laterality: N/A;  priority 3   COLONOSCOPY WITH PROPOFOL N/A 10/20/2019   Procedure: COLONOSCOPY WITH PROPOFOL;  Surgeon: Midge Minium, MD;  Location: [facility] ENDOSCOPY;  Service: Endoscopy;  Laterality: N/A;   COLONOSCOPY WITH PROPOFOL N/A 11/16/2021   Procedure: COLONOSCOPY WITH PROPOFOL;  Surgeon: Midge Minium, MD;  Location: [facility] ENDOSCOPY;  Service: Endoscopy;  Laterality: N/A;   ESOPHAGEAL DILATION  10/19/2019   Procedure: ESOPHAGEAL DILATION;  Surgeon: Midge Minium, MD;  Location: [facility] SURGERY CNTR;  Service: Endoscopy;;   ESOPHAGOGASTRODUODENOSCOPY (EGD) WITH PROPOFOL N/A 10/19/2019   Procedure: ESOPHAGOGASTRODUODENOSCOPY (EGD) WITH PROPOFOL;  Surgeon: Midge Minium, MD;  Location: [facility] SURGERY CNTR;  Service: Endoscopy;  Laterality: N/A;   FLEXIBLE SIGMOIDOSCOPY N/A 03/20/2022   Procedure: FLEXIBLE SIGMOIDOSCOPY;  Surgeon: Midge Minium, MD;  Location: [facility] ENDOSCOPY;  Service: Endoscopy;  Laterality: N/A;   HEMORRHOID SURGERY     MASTECTOMY, PARTIAL Right 2010   PACEMAKER INSERTION N/A 09/03/2020   Procedure: PACEMAKER WITH LEFT BUNDLE LEAD (MEDTRONIC); Location: [facility]; Surgeon: Constance Haw, MD   [facility] REMOVAL  2021   PORTACATH PLACEMENT Right 11/23/2019   Procedure: INSERTION PORT-A-CATH;  Surgeon: Campbell Lerner, MD;  Location: [facility] ORS;  Service: General;  Laterality: Right;   RECTAL EXAM UNDER ANESTHESIA N/A 07/18/2022   Procedure: RECTAL EXAM UNDER ANESTHESIA;  Surgeon: Campbell Lerner, MD;  Location: [facility]  ORS;  Service: General;  Laterality: N/A;   RIGHT COLECTOMY Right 10/2019   Procedure: ROBOTIC ASSISTED TIGHT HEMICOLECTOMY   RIGHT/LEFT HEART CATH AND CORONARY ANGIOGRAPHY Right 08/11/2020   Procedure: RIGHT/LEFT HEART CATH AND CORONARY ANGIOGRAPHY; Location: [facility]; Surgeon: Manson Allan, MD   TRANSCATHETER AORTIC VALVE REPLACEMENT, TRANSFEMORAL Right 08/31/2020   Procedure: TRANSCATHETER AORTIC VALVE REPLACEMENT, RIGHT TRANSFEMORAL; Location: [facility]; Surgeon: Clent Jacks, MD   TUMOR EXCISION N/A 04/11/2022   Procedure: TUMOR EXCISION RECTAL;  Surgeon: Campbell Lerner, MD;  Location: [facility] ORS;  Service: General;  Laterality: N/A;    Medications Prior to Admission  Medication Sig Dispense Refill Last Dose   amLODipine (NORVASC) 5 MG tablet Take 1 tablet (5 mg total) by mouth every morning. 90 tablet 1 02/07/2023   aspirin EC 81 MG tablet Take 81 mg by mouth daily.   02/07/2023   benazepril (LOTENSIN) 20 MG tablet Take 1 tablet (20 mg total) by mouth every morning. 90 tablet 1 02/07/2023   hydrochlorothiazide (MICROZIDE) 12.5 MG capsule Take 1 capsule (12.5 mg total) by mouth every morning. 90 capsule 1 02/07/2023   methimazole (TAPAZOLE) 5 MG tablet Take by  mouth.   02/07/2023   metoprolol succinate (TOPROL-XL) 50 MG 24 hr tablet TAKE 1 TABLET BY MOUTH ONCE DAILY WITH OR IMMEDIATELY FOLLOWING A MEAL 90 tablet 1 02/07/2023   Multiple Vitamin (MULTI-VITAMINS) TABS Take 1 tablet by mouth daily.    02/07/2023   Probiotic Product (PROBIOTIC ADVANCED PO) Take 1 capsule by mouth daily. When taking antibiotics pt will take 2 capsules instead of 1, normally just takes 1 per day otherwise   02/07/2023   vitamin B-12 (CYANOCOBALAMIN) 1000 MCG tablet Take 1,000 mcg by mouth daily.   02/07/2023   Zinc Acetate, Oral, (ZINC ACETATE PO) Take by mouth.   02/07/2023   ibuprofen (ADVIL) 200 MG tablet Take 200 mg by mouth every 6 (six) hours as needed.      silver sulfADIAZINE (SILVADENE) 1 % cream Apply 1 application  topically daily. (Patient not taking: Reported on 02/08/2023) 50 g 0 Not Taking   Sodium Hyaluronate, oral, (HYALURONIC ACID PO) Take 1 tablet by mouth daily.      sulfamethoxazole-trimethoprim (BACTRIM DS) 800-160 MG tablet Take 1 tablet by mouth 2 (two) times daily. (Patient not taking: Reported on 02/08/2023) 20 tablet 0 Not Taking   Social History   Socioeconomic History   Marital status: Widowed    Spouse name: Not on file   Number of children: Not on file   Years of education: Not on file   Highest education level: Not on file  Occupational History   Occupation: retired   Tobacco Use   Smoking status: Never    Passive exposure: Never   Smokeless tobacco: Never  Vaping Use   Vaping status: Never Used  Substance and Sexual Activity   Alcohol use: Not Currently    Alcohol/week: 21.0 standard drinks of alcohol    Types: 21 Shots of liquor per week   Drug use: No   Sexual activity: Not Currently  Other Topics Concern   Not on file  Social History Narrative   Lives alone   Social Determinants of Health   Financial Resource Strain: Low Risk  (09/10/2022)   Overall Financial Resource Strain (CARDIA)    Difficulty of Paying Living Expenses: Not hard at all  Food Insecurity: No Food Insecurity (02/08/2023)   Hunger Vital Sign    Worried About Running Out of Food in the Last Year: Never true    Ran Out of Food in the Last Year: Never true  Transportation Needs: No Transportation Needs (02/08/2023)   PRAPARE - Administrator, Civil Service (Medical): No    Lack of Transportation (Non-Medical): No  Physical Activity: Insufficiently Active (09/10/2022)   Exercise Vital Sign    Days of Exercise per Week: 3 days    Minutes of Exercise per Session: 30 min  Stress: No Stress Concern Present (09/10/2022)   [employer] of Occupational Health - Occupational Stress Questionnaire    Feeling of Stress : Not at all  Social Connections: Moderately Isolated (09/10/2022)    Social Connection and Isolation Panel [NHANES]    Frequency of Communication with Friends and Family: More than three times a week    Frequency of Social Gatherings with Friends and Family: More than three times a week    Attends Religious Services: Never    Database administrator or Organizations: Yes    Attends Engineer, structural: More than 4 times per year    Marital Status: Widowed  Intimate Partner Violence: Not At Risk (02/08/2023)   Humiliation, Afraid,  Rape, and Kick questionnaire    Fear of Current or Ex-Partner: No    Emotionally Abused: No    Physically Abused: No    Sexually Abused: No    Family History  Problem Relation Age of Onset   Cancer Mother    Heart disease Father    Breast cancer Paternal Aunt 74     Vitals:   02/07/23 2006 02/08/23 0038 02/08/23 0500 02/08/23 0814  BP:  (!) 145/63  (!) 118/59  Pulse:  84  77  Resp:  17  17  Temp: 98 F (36.7 C) 98 F (36.7 C)  98 F (36.7 C)  TempSrc: Oral Oral    SpO2:  100%  99%  Weight:  80.3 kg 89.6 kg   Height:  5\' 3"  (1.6 m)      PHYSICAL EXAM General: Well-appearing elderly female, well nourished, in no acute distress sitting upright in hospital bed. HEENT: Normocephalic and atraumatic. Neck: No JVD.  Lungs: Normal respiratory effort on room air. Clear bilaterally to auscultation. No wheezes, crackles, rhonchi.  Heart: HRRR. Normal S1 and S2 without gallops or murmurs.  Abdomen: Non-distended appearing.  Msk: Normal strength and tone for age. Extremities: Warm and well perfused. No clubbing, cyanosis.  No edema.  Notable ecchymosis to right lower extremity. Neuro: Alert and oriented X 3. Psych: Answers questions appropriately.   Labs: Basic Metabolic Panel: Recent Labs    02/07/23 1459 02/08/23 0451  NA 132* 131*  K 2.3* 3.7  CL 92* 94*  CO2 24 27  GLUCOSE 177* 135*  BUN 13 11  CREATININE 0.63 0.41*  CALCIUM 8.6* 8.4*  MG 1.8  --   PHOS 2.7  --    Liver Function Tests: Recent  Labs    02/07/23 1459  AST 32  ALT 22  ALKPHOS 68  BILITOT 1.3*  PROT 6.4*  ALBUMIN 3.6   No results for input(s): "LIPASE", "AMYLASE" in the last 72 hours. CBC: Recent Labs    02/07/23 1459 02/08/23 0451  WBC 9.5 11.0*  NEUTROABS 7.1  --   HGB 12.8 11.6*  HCT 34.5* 32.1*  MCV 99.1 99.7  PLT 155 144*   Cardiac Enzymes: No results for input(s): "CKTOTAL", "CKMB", "CKMBINDEX", "TROPONINIHS" in the last 72 hours. BNP: Recent Labs    02/07/23 1459  BNP 63.5   D-Dimer: No results for input(s): "DDIMER" in the last 72 hours. Hemoglobin A1C: No results for input(s): "HGBA1C" in the last 72 hours. Fasting Lipid Panel: No results for input(s): "CHOL", "HDL", "LDLCALC", "TRIG", "CHOLHDL", "LDLDIRECT" in the last 72 hours. Thyroid Function Tests: No results for input(s): "TSH", "T4TOTAL", "T3FREE", "THYROIDAB" in the last 72 hours.  Invalid input(s): "FREET3" Anemia Panel: No results for input(s): "VITAMINB12", "FOLATE", "FERRITIN", "TIBC", "IRON", "RETICCTPCT" in the last 72 hours.   Radiology: CT SHOULDER RIGHT WO CONTRAST  Result Date: 02/07/2023 CLINICAL DATA:  Shoulder trauma with fracture of humerus or scapula. After mechanical fall. EXAM: CT OF THE UPPER RIGHT EXTREMITY WITHOUT CONTRAST TECHNIQUE: Multidetector CT imaging of the upper right extremity was performed according to the standard protocol. RADIATION DOSE REDUCTION: This exam was performed according to the departmental dose-optimization program which includes automated exposure control, adjustment of the mA and/or kV according to patient size and/or use of iterative reconstruction technique. 3-dimensional CT images were rendered by post-processing of the original CT data on an acquisition workstation. The 3-dimensional CT images are concordant with the findings CT report for this study. COMPARISON:  Shoulder radiographs 02/07/2023  FINDINGS: Bones/Joint/Cartilage Acute comminuted intra-articular fracture of the neck  of the right humerus. There is superior and lateral displacement of the dominant fragment. The humeral head is displaced anteriorly and impacted on the anterior glenoid. No definite glenoid fracture. Ligaments Suboptimally assessed by CT. Muscles and Tendons Hematoma within the rotator cuff muscles at near their insertions. Soft tissues Partially visualized pacemaker leads and TAVR. IMPRESSION: 1. Acute comminuted intra-articular right humeral head and neck fracture. The humeral head is displaced anteriorly and impacted on the anterior glenoid. 2. Hematoma within the rotator cuff muscles about the insertion on the humeral head. Electronically Signed   By: Minerva Fester M.D.   On: 02/07/2023 19:49   CT 3D RECON AT SCANNER  Result Date: 02/07/2023 CLINICAL DATA:  Shoulder trauma with fracture of humerus or scapula. After mechanical fall. EXAM: CT OF THE UPPER RIGHT EXTREMITY WITHOUT CONTRAST TECHNIQUE: Multidetector CT imaging of the upper right extremity was performed according to the standard protocol. RADIATION DOSE REDUCTION: This exam was performed according to the departmental dose-optimization program which includes automated exposure control, adjustment of the mA and/or kV according to patient size and/or use of iterative reconstruction technique. 3-dimensional CT images were rendered by post-processing of the original CT data on an acquisition workstation. The 3-dimensional CT images are concordant with the findings CT report for this study. COMPARISON:  Shoulder radiographs 02/07/2023 FINDINGS: Bones/Joint/Cartilage Acute comminuted intra-articular fracture of the neck of the right humerus. There is superior and lateral displacement of the dominant fragment. The humeral head is displaced anteriorly and impacted on the anterior glenoid. No definite glenoid fracture. Ligaments Suboptimally assessed by CT. Muscles and Tendons Hematoma within the rotator cuff muscles at near their insertions. Soft tissues  Partially visualized pacemaker leads and TAVR. IMPRESSION: 1. Acute comminuted intra-articular right humeral head and neck fracture. The humeral head is displaced anteriorly and impacted on the anterior glenoid. 2. Hematoma within the rotator cuff muscles about the insertion on the humeral head. Electronically Signed   By: Minerva Fester M.D.   On: 02/07/2023 19:49   DG Shoulder Right  Result Date: 02/07/2023 CLINICAL DATA:  Fall.  Right shoulder pain. EXAM: RIGHT SHOULDER - 2+ VIEW COMPARISON:  None Available. FINDINGS: There is diffuse decreased bone mineralization. There is an acute, comminuted, displaced fracture of the right humeral head and surgical neck. A dominant medial and anterior humeral head fracture component measuring up to 5.8 cm in craniocaudal dimension appears displaced anteriorly and medially approximately 3 cm. There are comminuted fracture fragments within the lateral aspect of the humeral head with mild posterior displacement. Mild acromioclavicular joint space narrowing and peripheral osteophytosis. Partial visualization of AICD wires. A likely aortic valve stent graft is again seen. IMPRESSION: Acute, comminuted, fractures of the right humeral head and surgical neck. A dominant anterior humeral head fracture component is displaced anteriorly and medially. Electronically Signed   By: Neita Garnet M.D.   On: 02/07/2023 16:39   DG Tibia/Fibula Right  Result Date: 02/07/2023 CLINICAL DATA:  Fall with pain in the lower leg history of neuropathy EXAM: RIGHT TIBIA AND FIBULA - 2 VIEW COMPARISON:  None Available. FINDINGS: No acute fracture or malalignment. Moderate degenerative changes at the knee with chondrocalcinosis. Bulky superior patellar osteophyte versus calcified loose body. IMPRESSION: No acute osseous abnormality. Electronically Signed   By: Jasmine Pang M.D.   On: 02/07/2023 15:58    ECHO 11/2021: NORMAL LEFT VENTRICULAR SYSTOLIC FUNCTION  NORMAL RIGHT VENTRICULAR SYSTOLIC  FUNCTION  MILD VALVULAR REGURGITATION (  See above)  NO VALVULAR STENOSIS  PACER WIRE NOTED  Leaflets: OTHER PROSTHETIC  Closest EF: >55% (Estimated)  AVS: OTHER PROSTHETIC AoV  Mitral: MILD MR  Tricuspid: TRIVIAL TR   TELEMETRY reviewed by me [facility]) 02/08/2023: Ventricular pacing rate 80s  EKG reviewed by me: Ventricular paced rhythm rate 85 bpm  Data reviewed by me [facility]) 02/08/2023: last 24h vitals tele labs imaging I/O ED provider note, admission H&P, Ortho notes  Principal Problem:   Closed fracture of right shoulder Active Problems:   Hypertension   Hyperthyroidism   Fall at home, initial encounter   Chronic diastolic CHF (congestive heart failure) (HCC)   Hypokalemia   Obesity (BMI 30-39.9)   Alcohol use    ASSESSMENT AND PLAN:  Maria Davies is a 82 y.o. 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  Cardiology was consulted for preoperative cardiac evaluation prior to surgical repair of R shoulder fracture planned for 02/11/2023.  # Fall # Right shoulder fracture Patient with mechanical fall at home with injury to right shoulder and right lower leg.  CT of right shoulder showed acute comminuted intra-articular right humeral head and neck fracture. -Ortho following -Plan for surgical repair on 02/11/2023  # Complete heart block s/p PPM 08/2020 # Severe aortic stenosis s/p TAVR 08/2020 Patient follows regularly with device clinic.  Most recent echo from 11/2021 with EF > 55%, prosthetic aortic valve appeared to be functioning well.  -Will obtain echo for evaluation of heart function prior to surgery.  Okay to proceed with surgery if echo is stable.  # Hypertension # Hypokalemia Blood pressure relatively controlled since admission,  potassium noted to be 2.3 on presentation.  Home BP regimen includes amlodipine, benazepril, metoprolol, hydrochlorothiazide. -Continue home amlodipine, benazepril, metoprolol. -Hold hydrochlorothiazide, suspect this may have contributed to patient's hypokalemia.  Recommend discontinuing on discharge. -Monitor and replenish electrolytes for a goal K >4, Mag >2   # Aortic atherosclerosis CT chest 2021 with mild atherosclerotic calcification of aortic arch.  Left heart cath 07/2020 with no significant obstructive coronary artery disease. -Continue aspirin 81 mg daily perioperatively.  Okay to proceed with surgery from cardiac perspective pending echo read.  Continue all cardiac medications perioperatively. No further recommendations from cardiac perspective.  This patient's plan of care was discussed and created with Dr. Melton Alar and she is in agreement.  Signed: Cheryl Flash, PA-C 02/08/2023, 10:15 AM [facility]

## 2023-02-08 NOTE — Assessment & Plan Note (Signed)
Methimazole

## 2023-02-08 NOTE — Assessment & Plan Note (Signed)
Pt reported 3-4 small beverages most days on admission. Started on CIWA to monitor for any withdrawal symptoms.   --No symptoms.  Stopped CIWA

## 2023-02-08 NOTE — TOC Initial Note (Signed)
Transition of Care [facility]) - Initial/Assessment Note    Patient Details  Name: Maria Davies MRN: [id number] Date of Birth: 06-Apr-1941  Transition of Care [facility]) CM/SW Contact:    Liliana Cline, LCSW Phone Number: 02/08/2023, 1:11 PM  Clinical Narrative:                 Spoke with patient. Patient is from home alone and drives at baseline. PCP is Dr. Laural Benes. Pharmacy is [facility].  Patient has a RW at home. No HH history. Went to [facility] for STR about 2 years ago.  Surgery planned for next week per chart review. TOC will follow for needs.    Expected Discharge Plan: Skilled Nursing Facility Barriers to Discharge: Continued Medical Work up   Patient Goals and CMS Choice Patient states their goals for this hospitalization and ongoing recovery are:: TBD CMS Medicare.gov Compare Post Acute Care list provided to:: Patient Choice offered to / list presented to : Patient      Expected Discharge Plan and Services       Living arrangements for the past 2 months: Single Family Home                                      Prior Living Arrangements/Services Living arrangements for the past 2 months: Single Family Home Lives with:: Self Patient language and need for interpreter reviewed:: Yes Do you feel safe going back to the place where you live?: Yes      Need for Family Participation in Patient Care: Yes (Comment) Care giver support system in place?: Yes (comment) Current home services: DME Criminal Activity/Legal Involvement Pertinent to Current Situation/Hospitalization: No - Comment as needed  Activities of Daily Living Home Assistive Devices/Equipment: None ADL Screening (condition at time of admission) Patient's cognitive ability adequate to safely complete daily activities?: Yes Is the patient deaf or have difficulty hearing?: No Does the patient have difficulty seeing, even when wearing glasses/contacts?: No Does the patient have difficulty concentrating,  remembering, or making decisions?: No Patient able to express need for assistance with ADLs?: Yes Does the patient have difficulty dressing or bathing?: No Independently performs ADLs?: Yes (appropriate for developmental age) Does the patient have difficulty walking or climbing stairs?: No Weakness of Legs: None Weakness of Arms/Hands: Right  Permission Sought/Granted Permission sought to share information with : Facility Industrial/product designer granted to share information with : Yes, Verbal Permission Granted (by patient)     Permission granted to share info w AGENCY: as needed        Emotional Assessment       Orientation: : Oriented to Situation, Oriented to Self, Oriented to  Time, Oriented to Place Alcohol / Substance Use: Not Applicable Psych Involvement: No (comment)  Admission diagnosis:  Hypokalemia [E87.6] Closed fracture of right shoulder [S42.91XA] Closed fracture of right shoulder, initial encounter [S42.91XA] Patient Active Problem List   Diagnosis Date Noted   Closed fracture of right proximal humerus 02/07/2023   Closed fracture of right shoulder 02/07/2023   Fall at home, initial encounter 02/07/2023   Chronic diastolic CHF (congestive heart failure) (HCC) 02/07/2023   Hypokalemia 02/07/2023   Obesity (BMI 30-39.9) 02/07/2023   Alcohol use 02/07/2023   History of colonic polyps    Rectal polyp    Personal history of colon cancer    Polyp of descending colon    Closed fracture of proximal  end of left humerus 09/18/2021   Guillain Barr syndrome (HCC) 05/08/2021   Lumbar arthropathy 04/10/2021   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   Bilateral leg weakness 03/31/2021   Presence of permanent cardiac pacemaker 12/13/2020   Aortic atherosclerosis (HCC) 09/07/2020   Postoperative anemia 09/01/2020   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   Post-operative pain 08/31/2020   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    Personal history of other malignant neoplasm of large intestine 06/18/2020   Presence of cardiac pacemaker 06/18/2020   Goals of care, counseling/discussion 11/19/2019   Status post laparoscopic colectomy 11/17/2019   Colon cancer (HCC) 10/28/2019   Iron deficiency anemia secondary to blood loss (chronic)    Polyp of sigmoid colon    Benign neoplasm of cecum    Intestines neoplasm    Stricture and stenosis of esophagus    Advanced care planning/counseling discussion 07/22/2017   Hypercholesteremia 07/03/2016   Osteoporosis 07/03/2016   Carcinoma of right breast (HCC) 06/19/2016   Aortic stenosis, mild 05/02/2015   Hypertension 05/02/2015   Hyperthyroidism 05/02/2015   Breast cancer in female [facility]) 05/02/2015   PCP:  Dorcas Carrow, DO Pharmacy:   [facility] - [place] - [address] [place] [address] Phone: [phone] Fax: [phone]  [id number] - [place], [place] - [address] [place] [address] Phone: [phone] Fax: [phone]     Social Determinants of Health (SDOH) Social History: SDOH Screenings   Food Insecurity: No Food Insecurity (02/08/2023)  Housing: Low Risk  (02/08/2023)  Transportation Needs: No Transportation Needs (02/08/2023)  Utilities: Not At Risk (02/08/2023)  Alcohol Screen: Low Risk  (09/10/2022)  Depression (PHQ2-9): Low Risk  (11/05/2022)  Financial Resource Strain: Low Risk  (09/10/2022)  Physical Activity: Insufficiently Active (09/10/2022)  Social Connections: Moderately Isolated (09/10/2022)  Stress: No Stress Concern Present (09/10/2022)  Tobacco Use: Low Risk  (02/07/2023)   SDOH Interventions:     Readmission Risk Interventions    02/08/2023    1:10 PM  Readmission Risk Prevention Plan  Transportation Screening Complete  PCP or Specialist Appt within 5-7 Days Complete  Home Care Screening Complete  Medication Review (RN CM) Complete

## 2023-02-08 NOTE — Progress Notes (Signed)
*  PRELIMINARY RESULTS* Echocardiogram 2D Echocardiogram has been performed.  Cristela Blue 02/08/2023, 2:27 PM

## 2023-02-08 NOTE — Plan of Care (Signed)

## 2023-02-09 DIAGNOSIS — Z950 Presence of cardiac pacemaker: Secondary | ICD-10-CM | POA: Diagnosis not present

## 2023-02-09 DIAGNOSIS — M6259 Muscle wasting and atrophy, not elsewhere classified, multiple sites: Secondary | ICD-10-CM | POA: Diagnosis not present

## 2023-02-09 DIAGNOSIS — E039 Hypothyroidism, unspecified: Secondary | ICD-10-CM | POA: Diagnosis not present

## 2023-02-09 DIAGNOSIS — M62838 Other muscle spasm: Secondary | ICD-10-CM | POA: Diagnosis not present

## 2023-02-09 DIAGNOSIS — S42211A Unspecified displaced fracture of surgical neck of right humerus, initial encounter for closed fracture: Secondary | ICD-10-CM | POA: Diagnosis present

## 2023-02-09 DIAGNOSIS — E60 Dietary zinc deficiency: Secondary | ICD-10-CM | POA: Diagnosis not present

## 2023-02-09 DIAGNOSIS — Z96611 Presence of right artificial shoulder joint: Secondary | ICD-10-CM | POA: Diagnosis not present

## 2023-02-09 DIAGNOSIS — I700 Atherosclerosis of aorta: Secondary | ICD-10-CM | POA: Diagnosis present

## 2023-02-09 DIAGNOSIS — Z923 Personal history of irradiation: Secondary | ICD-10-CM | POA: Diagnosis not present

## 2023-02-09 DIAGNOSIS — E871 Hypo-osmolality and hyponatremia: Secondary | ICD-10-CM

## 2023-02-09 DIAGNOSIS — E569 Vitamin deficiency, unspecified: Secondary | ICD-10-CM | POA: Diagnosis not present

## 2023-02-09 DIAGNOSIS — Z736 Limitation of activities due to disability: Secondary | ICD-10-CM | POA: Diagnosis not present

## 2023-02-09 DIAGNOSIS — I2510 Atherosclerotic heart disease of native coronary artery without angina pectoris: Secondary | ICD-10-CM | POA: Diagnosis present

## 2023-02-09 DIAGNOSIS — R52 Pain, unspecified: Secondary | ICD-10-CM | POA: Diagnosis not present

## 2023-02-09 DIAGNOSIS — I442 Atrioventricular block, complete: Secondary | ICD-10-CM | POA: Diagnosis present

## 2023-02-09 DIAGNOSIS — I350 Nonrheumatic aortic (valve) stenosis: Secondary | ICD-10-CM | POA: Diagnosis present

## 2023-02-09 DIAGNOSIS — S4291XA Fracture of right shoulder girdle, part unspecified, initial encounter for closed fracture: Secondary | ICD-10-CM | POA: Diagnosis present

## 2023-02-09 DIAGNOSIS — I5033 Acute on chronic diastolic (congestive) heart failure: Secondary | ICD-10-CM | POA: Diagnosis present

## 2023-02-09 DIAGNOSIS — S42291A Other displaced fracture of upper end of right humerus, initial encounter for closed fracture: Secondary | ICD-10-CM | POA: Diagnosis present

## 2023-02-09 DIAGNOSIS — K5900 Constipation, unspecified: Secondary | ICD-10-CM | POA: Diagnosis not present

## 2023-02-09 DIAGNOSIS — R112 Nausea with vomiting, unspecified: Secondary | ICD-10-CM | POA: Diagnosis not present

## 2023-02-09 DIAGNOSIS — S8011XA Contusion of right lower leg, initial encounter: Secondary | ICD-10-CM | POA: Diagnosis present

## 2023-02-09 DIAGNOSIS — I10 Essential (primary) hypertension: Secondary | ICD-10-CM | POA: Diagnosis not present

## 2023-02-09 DIAGNOSIS — Y9301 Activity, walking, marching and hiking: Secondary | ICD-10-CM | POA: Diagnosis present

## 2023-02-09 DIAGNOSIS — I509 Heart failure, unspecified: Secondary | ICD-10-CM | POA: Diagnosis not present

## 2023-02-09 DIAGNOSIS — S43001A Unspecified subluxation of right shoulder joint, initial encounter: Secondary | ICD-10-CM | POA: Diagnosis not present

## 2023-02-09 DIAGNOSIS — M25511 Pain in right shoulder: Secondary | ICD-10-CM | POA: Diagnosis present

## 2023-02-09 DIAGNOSIS — G4700 Insomnia, unspecified: Secondary | ICD-10-CM | POA: Diagnosis not present

## 2023-02-09 DIAGNOSIS — Z471 Aftercare following joint replacement surgery: Secondary | ICD-10-CM | POA: Diagnosis not present

## 2023-02-09 DIAGNOSIS — Z953 Presence of xenogenic heart valve: Secondary | ICD-10-CM | POA: Diagnosis not present

## 2023-02-09 DIAGNOSIS — D519 Vitamin B12 deficiency anemia, unspecified: Secondary | ICD-10-CM | POA: Diagnosis not present

## 2023-02-09 DIAGNOSIS — W19XXXA Unspecified fall, initial encounter: Secondary | ICD-10-CM | POA: Diagnosis not present

## 2023-02-09 DIAGNOSIS — Z789 Other specified health status: Secondary | ICD-10-CM | POA: Diagnosis not present

## 2023-02-09 DIAGNOSIS — Z8249 Family history of ischemic heart disease and other diseases of the circulatory system: Secondary | ICD-10-CM | POA: Diagnosis not present

## 2023-02-09 DIAGNOSIS — K588 Other irritable bowel syndrome: Secondary | ICD-10-CM | POA: Diagnosis not present

## 2023-02-09 DIAGNOSIS — M84421A Pathological fracture, right humerus, initial encounter for fracture: Secondary | ICD-10-CM | POA: Diagnosis not present

## 2023-02-09 DIAGNOSIS — G8918 Other acute postprocedural pain: Secondary | ICD-10-CM | POA: Diagnosis not present

## 2023-02-09 DIAGNOSIS — M8580 Other specified disorders of bone density and structure, unspecified site: Secondary | ICD-10-CM | POA: Diagnosis present

## 2023-02-09 DIAGNOSIS — E0590 Thyrotoxicosis, unspecified without thyrotoxic crisis or storm: Secondary | ICD-10-CM | POA: Diagnosis present

## 2023-02-09 DIAGNOSIS — Z803 Family history of malignant neoplasm of breast: Secondary | ICD-10-CM | POA: Diagnosis not present

## 2023-02-09 DIAGNOSIS — R278 Other lack of coordination: Secondary | ICD-10-CM | POA: Diagnosis not present

## 2023-02-09 DIAGNOSIS — Z7901 Long term (current) use of anticoagulants: Secondary | ICD-10-CM | POA: Diagnosis not present

## 2023-02-09 DIAGNOSIS — G610 Guillain-Barre syndrome: Secondary | ICD-10-CM | POA: Diagnosis not present

## 2023-02-09 DIAGNOSIS — W010XXA Fall on same level from slipping, tripping and stumbling without subsequent striking against object, initial encounter: Secondary | ICD-10-CM | POA: Diagnosis present

## 2023-02-09 DIAGNOSIS — Z6839 Body mass index (BMI) 39.0-39.9, adult: Secondary | ICD-10-CM | POA: Diagnosis not present

## 2023-02-09 DIAGNOSIS — E669 Obesity, unspecified: Secondary | ICD-10-CM | POA: Diagnosis present

## 2023-02-09 DIAGNOSIS — R29898 Other symptoms and signs involving the musculoskeletal system: Secondary | ICD-10-CM | POA: Diagnosis not present

## 2023-02-09 DIAGNOSIS — Z85038 Personal history of other malignant neoplasm of large intestine: Secondary | ICD-10-CM | POA: Diagnosis not present

## 2023-02-09 DIAGNOSIS — Z85828 Personal history of other malignant neoplasm of skin: Secondary | ICD-10-CM | POA: Diagnosis not present

## 2023-02-09 DIAGNOSIS — Z853 Personal history of malignant neoplasm of breast: Secondary | ICD-10-CM | POA: Diagnosis not present

## 2023-02-09 DIAGNOSIS — Y92009 Unspecified place in unspecified non-institutional (private) residence as the place of occurrence of the external cause: Secondary | ICD-10-CM | POA: Diagnosis not present

## 2023-02-09 DIAGNOSIS — E876 Hypokalemia: Secondary | ICD-10-CM | POA: Diagnosis present

## 2023-02-09 DIAGNOSIS — R2681 Unsteadiness on feet: Secondary | ICD-10-CM | POA: Diagnosis not present

## 2023-02-09 DIAGNOSIS — E785 Hyperlipidemia, unspecified: Secondary | ICD-10-CM | POA: Diagnosis present

## 2023-02-09 DIAGNOSIS — R531 Weakness: Secondary | ICD-10-CM | POA: Diagnosis not present

## 2023-02-09 DIAGNOSIS — I110 Hypertensive heart disease with heart failure: Secondary | ICD-10-CM | POA: Diagnosis present

## 2023-02-09 DIAGNOSIS — S42201A Unspecified fracture of upper end of right humerus, initial encounter for closed fracture: Secondary | ICD-10-CM | POA: Diagnosis not present

## 2023-02-09 DIAGNOSIS — D120 Benign neoplasm of cecum: Secondary | ICD-10-CM | POA: Diagnosis not present

## 2023-02-09 LAB — CBC
HCT: 31.3 % — ABNORMAL LOW (ref 36.0–46.0)
Hemoglobin: 11 g/dL — ABNORMAL LOW (ref 12.0–15.0)
MCH: 36.7 pg — ABNORMAL HIGH (ref 26.0–34.0)
MCHC: 35.1 g/dL (ref 30.0–36.0)
MCV: 104.3 fL — ABNORMAL HIGH (ref 80.0–100.0)
Platelets: 118 10*3/uL — ABNORMAL LOW (ref 150–400)
RBC: 3 MIL/uL — ABNORMAL LOW (ref 3.87–5.11)
RDW: 14 % (ref 11.5–15.5)
WBC: 8.7 10*3/uL (ref 4.0–10.5)
nRBC: 0 % (ref 0.0–0.2)

## 2023-02-09 LAB — BASIC METABOLIC PANEL
Anion gap: 8 (ref 5–15)
BUN: 8 mg/dL (ref 8–23)
CO2: 30 mmol/L (ref 22–32)
Calcium: 8.6 mg/dL — ABNORMAL LOW (ref 8.9–10.3)
Chloride: 94 mmol/L — ABNORMAL LOW (ref 98–111)
Creatinine, Ser: 0.43 mg/dL — ABNORMAL LOW (ref 0.44–1.00)
GFR, Estimated: >60 mL/min (ref >60)
Glucose, Bld: 115 mg/dL — ABNORMAL HIGH (ref 70–99)
Potassium: 3.5 mmol/L (ref 3.5–5.1)
Sodium: 132 mmol/L — ABNORMAL LOW (ref 135–145)

## 2023-02-09 LAB — ECHOCARDIOGRAM COMPLETE
AR max vel: 1.18 cm2
AV Area VTI: 1.18 cm2
AV Area mean vel: 1.17 cm2
AV Mean grad: 16.5 mmHg
AV Peak grad: 29 mmHg
Ao pk vel: 2.69 m/s
Area-P 1/2: 4.46 cm2
Height: 63 in
MV VTI: 2.36 cm2
S' Lateral: 2.8 cm
Weight: 3160.51 [oz_av]

## 2023-02-09 LAB — OSMOLALITY: Osmolality: 272 mosm/kg — ABNORMAL LOW (ref 275–295)

## 2023-02-09 MED ORDER — POLYETHYLENE GLYCOL 3350 17 G PO PACK
17.0000 g | PACK | Freq: Every day | ORAL | Status: DC
  Administered 2023-02-09 – 2023-02-13 (×4): 17 g via ORAL
  Filled 2023-02-09 (×6): qty 1

## 2023-02-09 MED ORDER — FUROSEMIDE 10 MG/ML IJ SOLN
20.0000 mg | Freq: Two times a day (BID) | INTRAMUSCULAR | Status: DC
  Administered 2023-02-09 – 2023-02-10 (×3): 20 mg via INTRAVENOUS
  Filled 2023-02-09 (×3): qty 4

## 2023-02-09 MED ORDER — BISACODYL 5 MG PO TBEC: 5.0000 mg | DELAYED_RELEASE_TABLET | Freq: Every day | ORAL | Status: DC | PRN

## 2023-02-09 MED ORDER — SENNOSIDES-DOCUSATE SODIUM 8.6-50 MG PO TABS
1.0000 | ORAL_TABLET | Freq: Two times a day (BID) | ORAL | Status: DC
  Administered 2023-02-09 – 2023-02-14 (×8): 1 via ORAL
  Filled 2023-02-09 (×9): qty 1

## 2023-02-09 NOTE — Plan of Care (Signed)

## 2023-02-09 NOTE — Progress Notes (Signed)
Progress Note   Patient: Maria Davies VWU:[id number] DOB: 1941/03/17 DOA: 02/07/2023     1 DOS: the patient was seen and examined on 02/09/2023   Brief hospital course: HPI on admission 02/07/23: "Maria Davies is a 82 y.o. 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     Pt states that she fell accidentally when she was walking to the bathroom at a large time.  She injured her right shoulder and right lower leg, causing a larege bruises in the right leg shin area.  Patient is strongly denies head or neck injury.  She does not want to CT scan of head and neck at this moment.  No loss of consciousness.  Her right shoulder pain is constant, sharp, severe, radiating to the right side of neck, aggravated by movement. ..."  ED evaluation notable for: Stable vitals Labs with K 2.3, Na 132, Cl 92, glucose 177 CBC no leukocytosis, normal Hbg and platelets  Right shoulder x-ray and CT scan showed acute comminuted intra-articular right humeral head and neck fracture with humeral head displaced anteriorly and impacted on the anterior glenoid. Hematoma of rotator cuff muscles at humeral head insertion.  Pt was admitted to the hospital with Orthopedic Surgery consulted.  Cardiology also consulted for pre-operative evaluation and risk assessment.  Assessment and Plan:  * Closed fracture of right shoulder Mgmt per Orthopedic Surgery Cardiology consult for surgical clearance Echo pending. Sling and RUE non-weight-bearing Surgical fixation with likely reverse shoulder arthroplasty planned on Mon or Tues. Pain control Bowel regimen   Hyponatremia Hypervolemic with leg edema,  subjective dyspnea on exertion. Na 132 >> 131 >> 132 today. Low serum & urine osmolality --Starting IV Lasix --Repeat BMP in AM  Hypokalemia K 2.3 on admission was replaced. Mg 1.8 K this AM 3.7 Monitor BMP  Acute on chronic diastolic CHF (congestive heart failure) (HCC) Echo this admission EF   60-65%, grade I diastolic dysfunction, mild LVH, mild MR Pt has some mild BLE edema, no dyspnea, BNP normal 63.8 Currently appears mildly hypervolemic. --Start IV Lasix 20 mg Q12H --Daily weights and I/O's to monitor volume status --Continue Toprol-XL   Fall at home, initial encounter Resulted in R shoulder fracture. Has right leg bruising as well. 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 Started on CIWA to monitor for any withdrawal symptoms.   --No symptoms --Stop CIWA   Obesity (BMI 30-39.9) Body mass index is 34.99 kg/m. Complicates overall care and prognosis.  Recommend lifestyle modifications including physical activity and diet for weight loss and overall long-term health.  Aortic atherosclerosis (HCC) Continue ASA  Hyperthyroidism --Methimazole   Hypertension --IV hydralazine as needed --Amlodipine, Lotensin, metoprolol  Aortic stenosis, mild S/p TAVR in 08/2020        Subjective: Pt seen awake sitting up in bed this AM, wearing sling. She reports pain overall has been controlled, fairly comfortable as long as not attempting to move.  She notes some dyspnea on exertion at home prior to admission, has not been up much here due to her fracture and pain.  States leg swelling is worse than baseline.  Physical Exam: Vitals:   02/08/23 0814 02/08/23 1708 02/09/23 0127 02/09/23 0744  BP: (!) 118/59 (!) 120/59 136/63 133/64  Pulse: 77 80 73 73  Resp: 17 18 18 18   Temp: 98 F (36.7 C) 98.6 F (37 C) 98.6 F (37 C) 99 F (37.2 C)   TempSrc: Oral   Oral  SpO2: 99% 100% 100% 97%  Weight:      Height:       General exam: awake, alert, no acute distress HEENT: moist mucus membranes, hearing grossly normal  Respiratory system: CTAB no wheezes, rales or rhonchi, normal respiratory effort. Cardiovascular system: normal S1/S2, RRR, no JVD, 1-2+ BLE pitting edema.   Gastrointestinal system: soft, NT, ND, no HSM felt, +bowel sounds. Central nervous system: A&O x 3. no gross focal neurologic deficits, normal speech Extremities: right arm placed in sling, right distal LE ecchymosis Skin: dry, intact, normal temperature Psychiatry: normal mood, congruent affect, judgement and insight appear normal   Data Reviewed:  Notable labs ---  Na 132, serum osm 272, urine osm 228, urine Na 26 K 3.5, Cl 94, glucose 115, Cr 0.43, Ca 8.6, WBC 11 >> 8.7, Hbg 11.0 stable, platelets 144 >> 118k  Family Communication: daughter at bedside on rounds 8/23. Not present today.    Disposition: Status is: Observation  Pt remains admitted pending surgery for shoulder fracture.  Also requiring IV diuresis.    Planned Discharge Destination: Skilled nursing facility    Time spent: 42 minutes  Author: Pennie Banter, DO 02/09/2023 1:35 PM  For on call review [email or web address].

## 2023-02-10 DIAGNOSIS — E876 Hypokalemia: Secondary | ICD-10-CM | POA: Diagnosis not present

## 2023-02-10 DIAGNOSIS — S4291XA Fracture of right shoulder girdle, part unspecified, initial encounter for closed fracture: Secondary | ICD-10-CM | POA: Diagnosis not present

## 2023-02-10 DIAGNOSIS — E871 Hypo-osmolality and hyponatremia: Secondary | ICD-10-CM | POA: Diagnosis not present

## 2023-02-10 LAB — BASIC METABOLIC PANEL
Anion gap: 7 (ref 5–15)
Anion gap: 9 (ref 5–15)
BUN: 9 mg/dL (ref 8–23)
BUN: 7 mg/dL — ABNORMAL LOW (ref 8–23)
CO2: 28 mmol/L (ref 22–32)
CO2: 27 mmol/L (ref 22–32)
Calcium: 8.5 mg/dL — ABNORMAL LOW (ref 8.9–10.3)
Calcium: 8.6 mg/dL — ABNORMAL LOW (ref 8.9–10.3)
Chloride: 93 mmol/L — ABNORMAL LOW (ref 98–111)
Chloride: 94 mmol/L — ABNORMAL LOW (ref 98–111)
Creatinine, Ser: 0.39 mg/dL — ABNORMAL LOW (ref 0.44–1.00)
Creatinine, Ser: 0.39 mg/dL — ABNORMAL LOW (ref 0.44–1.00)
GFR, Estimated: >60 mL/min (ref >60)
GFR, Estimated: >60 mL/min (ref >60)
Glucose, Bld: 105 mg/dL — ABNORMAL HIGH (ref 70–99)
Glucose, Bld: 108 mg/dL — ABNORMAL HIGH (ref 70–99)
Potassium: 3.3 mmol/L — ABNORMAL LOW (ref 3.5–5.1)
Potassium: 4 mmol/L (ref 3.5–5.1)
Sodium: 128 mmol/L — ABNORMAL LOW (ref 135–145)
Sodium: 130 mmol/L — ABNORMAL LOW (ref 135–145)

## 2023-02-10 LAB — CBC
HCT: 29.7 % — ABNORMAL LOW (ref 36.0–46.0)
Hemoglobin: 10.8 g/dL — ABNORMAL LOW (ref 12.0–15.0)
MCH: 36.7 pg — ABNORMAL HIGH (ref 26.0–34.0)
MCHC: 36.4 g/dL — ABNORMAL HIGH (ref 30.0–36.0)
MCV: 101 fL — ABNORMAL HIGH (ref 80.0–100.0)
Platelets: 140 10*3/uL — ABNORMAL LOW (ref 150–400)
RBC: 2.94 MIL/uL — ABNORMAL LOW (ref 3.87–5.11)
RDW: 13.5 % (ref 11.5–15.5)
WBC: 9.6 10*3/uL (ref 4.0–10.5)
nRBC: 0 % (ref 0.0–0.2)

## 2023-02-10 LAB — MAGNESIUM: Magnesium: 1.9 mg/dL (ref 1.7–2.4)

## 2023-02-10 MED ORDER — SODIUM CHLORIDE 0.9 % IV SOLN: INTRAVENOUS | Status: DC

## 2023-02-10 MED ORDER — SODIUM CHLORIDE 0.9 % IV SOLN: INTRAVENOUS | Status: AC

## 2023-02-10 MED ORDER — POTASSIUM CHLORIDE CRYS ER 20 MEQ PO TBCR
40.0000 meq | EXTENDED_RELEASE_TABLET | Freq: Once | ORAL | Status: AC
  Administered 2023-02-10: 40 meq via ORAL
  Filled 2023-02-10: qty 2

## 2023-02-10 NOTE — Progress Notes (Addendum)
Progress Note   Patient: Maria Davies [id number]:[id number] DOB: 1940-07-22 DOA: 02/07/2023     3 DOS: the patient was seen and examined on 02/10/2023   Brief hospital course: HPI on admission 02/07/23: "Maria Davies is a 82 y.o. 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     Pt states that she fell accidentally when she was walking to the bathroom at a large time.  She injured her right shoulder and right lower leg, causing a larege bruises in the right leg shin area.  Patient is strongly denies head or neck injury.  She does not want to CT scan of head and neck at this moment.  No loss of consciousness.  Her right shoulder pain is constant, sharp, severe, radiating to the right side of neck, aggravated by movement. ..."  ED evaluation notable for: Stable vitals Labs with K 2.3, Na 132, Cl 92, glucose 177 CBC no leukocytosis, normal Hbg and platelets  Right shoulder x-ray and CT scan showed acute comminuted intra-articular right humeral head and neck fracture with humeral head displaced anteriorly and impacted on the anterior glenoid. Hematoma of rotator cuff muscles at humeral head insertion.  Pt was admitted to the hospital with Orthopedic Surgery consulted.  Cardiology also consulted for pre-operative evaluation and risk assessment.  Further hospital course and management as outlined below.   Assessment and Plan:  * Closed fracture of right shoulder Mgmt per Orthopedic Surgery Cardiology consult for surgical clearance Sling and RUE non-weight-bearing Surgical fixation with likely reverse shoulder arthroplasty planned on Mon or Tues. Pain control Bowel  regimen   Hyponatremia Initially felt hypervolemic given BLE edema, subjective dyspnea on exertion. Na 132 >> 131 >> 132 (started diuresis) >> 128 today.  Low serum & urine osmolality --Stop IV Lasix --Fluid challenge today with repeat BMP at 6pm --Hold HCTZ --Ddx also includes SIADH, if not improved with fluids, will start on fluid restriction --Repeat BMP in AM  Hypokalemia K 2.3 on admission was replaced. Mg 1.8 K this AM 3.7 >> 3.3 PO K-Cl 40 mEq ordered for replacement Check Mg level in AM Monitor BMP  Acute on chronic diastolic CHF (congestive heart failure) (HCC) Echo this admission EF   60-65%, grade I diastolic dysfunction, mild LVH, mild MR Pt has some mild BLE edema, no dyspnea, BNP normal 63.8 Currently appears mildly hypervolemic. --Given IV Lasix 20 mg BID x 3 doses --Stop Lasix with sodium decline --LE edema improved --Daily weights and I/O's to monitor volume status --Continue Toprol-XL   Fall at home, initial encounter Resulted in R shoulder fracture. Has right leg bruising as well. 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 Started on CIWA to monitor for any withdrawal symptoms.   --No symptoms --Stop CIWA   Obesity (BMI 30-39.9) Body mass index is 34.99 kg/m. Complicates overall care and prognosis.  Recommend lifestyle modifications including physical activity and diet for weight loss and overall long-term health.  Aortic atherosclerosis (HCC) Continue ASA  Hyperthyroidism --Methimazole   Hypertension --IV hydralazine as needed --Amlodipine, Lotensin, metoprolol  Aortic stenosis, mild S/p TAVR in 08/2020  Subjective: Pt seen awake sitting up in bed this AM, daughter at bedside.  Pt reports pain controlled.  She has numbness in the right hands and the hand feels less dexterity.  Hand is warm, not painful.   She otherwise reports feeling okay overall.  No BM yet.   Physical Exam: Vitals:   02/09/23 1539 02/09/23 2320 02/10/23 0745 02/10/23 1621  BP: 131/82 130/67 117/64 (!) 123/50  Pulse: 73 89 86 (!) 57  Resp: 16 18 20 17   Temp: 98.1 F (36.7 C) 98.4 F (36.9 C) 98.2 F (36.8 C) 98.9 F (37.2 C)  TempSrc:   Oral   SpO2: 96% 99% 97% 96%  Weight:      Height:       General exam: awake, alert, no acute distress HEENT: moist mucus membranes, hearing grossly normal  Respiratory system: CTAB no wheezes, rales or rhonchi, normal respiratory effort. Cardiovascular system: normal S1/S2, RRR, improved BLE edema.   Gastrointestinal system: soft, NT, ND Central nervous system: A&O x 3. no gross focal neurologic deficits, normal speech, numbness of right thumb through medial 4th digits, motor intact Extremities: right arm placed in sling, right hand and fingers normal temperature, right distal LE ecchymosis Skin: dry, intact, normal temperature Psychiatry: normal mood, congruent affect, judgement and insight appear normal   Data Reviewed:  Notable labs ---  Na 128 K 3.3, Cl 93, glucose 105, Cr 0.39, Ca 8.5, Hbg 10.8 stable, platelets 118 >> 140k  Family Communication: daughter at bedside on rounds 8/23, 8/25  Disposition: Status is: Observation  Pt remains admitted pending surgery for shoulder fracture.  Persistent electrolyte derangements.    Planned Discharge Destination: Skilled nursing facility    Time spent: 42 minutes  Author: Pennie Banter, DO 02/10/2023 6:23 PM  For on call review [email or web address].

## 2023-02-10 NOTE — Plan of Care (Signed)

## 2023-02-11 ENCOUNTER — Ambulatory Visit: Admission: RE | Admit: 2023-02-11 | Payer: Medicare Other | Source: Ambulatory Visit

## 2023-02-11 DIAGNOSIS — E871 Hypo-osmolality and hyponatremia: Secondary | ICD-10-CM | POA: Diagnosis not present

## 2023-02-11 DIAGNOSIS — S4291XA Fracture of right shoulder girdle, part unspecified, initial encounter for closed fracture: Secondary | ICD-10-CM | POA: Diagnosis not present

## 2023-02-11 LAB — BASIC METABOLIC PANEL
Anion gap: 10 (ref 5–15)
BUN: 9 mg/dL (ref 8–23)
CO2: 28 mmol/L (ref 22–32)
Calcium: 8.9 mg/dL (ref 8.9–10.3)
Chloride: 97 mmol/L — ABNORMAL LOW (ref 98–111)
Creatinine, Ser: 0.38 mg/dL — ABNORMAL LOW (ref 0.44–1.00)
GFR, Estimated: >60 mL/min (ref >60)
Glucose, Bld: 113 mg/dL — ABNORMAL HIGH (ref 70–99)
Potassium: 4.5 mmol/L (ref 3.5–5.1)
Sodium: 135 mmol/L (ref 135–145)

## 2023-02-11 LAB — TSH: TSH: 1.684 u[IU]/mL (ref 0.350–4.500)

## 2023-02-11 LAB — CORTISOL-AM, BLOOD: Cortisol - AM: 11.4 ug/dL (ref 6.7–22.6)

## 2023-02-11 MED ORDER — CEFAZOLIN SODIUM-DEXTROSE 2-4 GM/100ML-% IV SOLN
2.0000 g | INTRAVENOUS | Status: AC
  Administered 2023-02-12: 2 g via INTRAVENOUS

## 2023-02-11 MED ORDER — SODIUM CHLORIDE 0.9 % IV SOLN: INTRAVENOUS | Status: AC

## 2023-02-11 NOTE — Consult Note (Signed)
ORTHOPAEDIC CONSULTATION  REQUESTING PHYSICIAN: Pennie Banter, DO  Chief Complaint:   Right shoulder pain status post fall.  History of Present Illness: Maria Davies is a 82 y.o. female with multiple medical problems including osteopenia, hypertension, hypothyroidism, breast cancer, aortic stenosis, adenocarcinoma of the colon, coronary artery disease with diastolic dysfunction, status post insertion of a permanent pacemaker for complete heart block, and status post aortic valve replacement for aortic stenosis.  The patient normally lives independently.  She was in her usual state of health when she apparently tripped and fell on her way to the bathroom of her home, landing on her right shoulder.  She was brought to the emergency room where x-rays demonstrated a displaced fracture dislocation of the right shoulder with complete displacement of the humeral head.  The patient denies striking her head or losing consciousness as a result of the fall.  She also denies any lightheadedness, dizziness, chest pain, shortness of breath, or other symptoms which may have precipitated her fall.  The patient was neurovascularly intact in the emergency room when she was evaluated by Dr. Audelia Acton on the evening of the fall in the emergency room.  She does note some mild numbness to her fingertips as well as some difficulty making a tight fist this morning.  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   a.) stage IIIb; b.) RIGHT colon mass Bx (+) for invasive moderately differentiated adenocarcinoma (pT3 pN1a)   Anemia    Aortic atherosclerosis (HCC)    Aortic stenosis    a.) TTE 08/17/2016: EF >55%, mild AS (MPG 29.5); b.) TTE 08/25/2018: EF >55%; mod AS (MPG 42.3); c.) TTE 06/17/2020: EF >55%; severe AS (MPG 47.2); d.) s/p TAVR 08/31/2020 at [facility] - 23 mm Edwards Sapien 3 Ultra valve placed via a RIGHT  percutaneous femoral approach --> complicated by intra/postoperative CHB requiring TVP and ultimate PPM placement   Arthritis    Basal cell carcinoma (BCC) of back    Bilateral leg edema    Breast cancer, right (HCC) 2011   a.) stage Ia IMC (ER/PR +, HER2/neu -); b.) lumpectomy + XRT in 2011; c.) completed 5 years AI therapy (anastrozole) in 11/2014   Cervical spinal stenosis    Cervical spondylosis    Chemotherapy induced nausea and vomiting    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   a.) TVP placed intraoperatively during TAVR secondary to development of CHB; b.) Medtronic PPM placed 09/03/2020 for postoperative continued postoperative CHB   Coronary artery disease    a.) RHC 08/11/2020: no sig CAD   DDD (degenerative disc disease), cervical    Diastolic dysfunction    a.) TTE 08/17/2016: EF >55%, mild LVH, mild LAE, triv panvalvular regurg, G1DD; b.) TTE 08/25/2018: EF >55%, mod LVH, mild LAE, triv AR/TR/PR, mild MR, G1dd; c.) TTE 08/11/2020: EF >55%, mild LVHH, mod LAE, triv MR/TR   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   Heart murmur    History of bilateral cataract extraction    Hyperplastic colon polyp    Hypertension    Hyperthyroidism    Neuropathy    Osteopenia    PONV (postoperative nausea and vomiting)    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   a.) TVP placed intraoperatively during TAVR secondary to development of CHB; b.) Medtronic PPM placed 09/03/2020 for postoperative continued postoperative CHB   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   a.) s/p TAVR 08/31/2020 at [facility] - 23 mm Edwards Sapien  3 Ultra valve placed via a RIGHT percutaneous femoral approach --> complicated by intra/postoperative CHB requiring TVP and ultimate PPM placement   Thyroid nodule 01/25/2022   a.) CT C-spine 01/25/2022: 22 mm right thyroid nodule   Past Surgical History:  Procedure Laterality Date   BACK SURGERY     BREAST EXCISIONAL BIOPSY Right 03/2009    BREAST EXCISIONAL BIOPSY Bilateral    NEG   CATARACT EXTRACTION W/ INTRAOCULAR LENS  IMPLANT, BILATERAL     CHOLECYSTECTOMY     COLONOSCOPY WITH PROPOFOL N/A 10/19/2019   Procedure: COLONOSCOPY WITH PROPOFOL-attempted, unable to perform poor prep;  Surgeon: Midge Minium, MD;  Location: [facility];  Service: Endoscopy;  Laterality: N/A;  priority 3   COLONOSCOPY WITH PROPOFOL N/A 10/20/2019   Procedure: COLONOSCOPY WITH PROPOFOL;  Surgeon: Midge Minium, MD;  Location: [facility] END[facility]COPY;  Service: Endoscopy;  Laterality: N/A;   COLONOSCOPY WITH PROPOFOL N/A 11/16/2021   Procedure: COLONOSCOPY WITH PROPOFOL;  Surgeon: Midge Minium, MD;  Location: [facility] END[facility];  Service: Endoscopy;  Laterality: N/A;   ESOPHAGEAL DILATION  10/19/2019   Procedure: ESOPHAGEAL DILATION;  Surgeon: Midge Minium, MD;  Location: [facility];  Service: Endoscopy;;   ESOPHAGOGASTRODUODENOSCOPY (EGD) WITH PROPOFOL N/A 10/19/2019   Procedure: ESOPHAGOGASTRODUODENOSCOPY (EGD) WITH PROPOFOL;  Surgeon: Midge Minium, MD;  Location: [facility];  Service: Endoscopy;  Laterality: N/A;   FLEXIBLE SIGMOIDOSCOPY N/A 03/20/2022   Procedure: FLEXIBLE SIGMOIDOSCOPY;  Surgeon: Midge Minium, MD;  Location: [facility] ENDOSCOPY;  Service: Endoscopy;  Laterality: N/A;   HEMORRHOID SURGERY     MASTECTOMY, PARTIAL Right 2010   PACEMAKER INSERTION N/A 09/03/2020   Procedure: PACEMAKER WITH LEFT BUNDLE LEAD (MEDTRONIC); Location: [facility]; Surgeon: Constance Haw, MD   [facility] REMOVAL  2021   PORTACATH PLACEMENT Right 11/23/2019   Procedure: INSERTION PORT-A-CATH;  Surgeon: Campbell Lerner, MD;  Location: [facility] ORS;  Service: General;  Laterality: Right;   RECTAL EXAM UNDER ANESTHESIA N/A 07/18/2022   Procedure: RECTAL EXAM UNDER ANESTHESIA;  Surgeon: Campbell Lerner, MD;  Location: [facility] ORS;  Service: General;  Laterality: N/A;   RIGHT COLECTOMY Right 10/2019   Procedure: ROBOTIC ASSISTED TIGHT HEMICOLECTOMY   RIGHT/LEFT HEART  CATH AND CORONARY ANGIOGRAPHY Right 08/11/2020   Procedure: RIGHT/LEFT HEART CATH AND CORONARY ANGIOGRAPHY; Location: [facility]; Surgeon: Manson Allan, MD   TRANSCATHETER AORTIC VALVE REPLACEMENT, TRANSFEMORAL Right 08/31/2020   Procedure: TRANSCATHETER AORTIC VALVE REPLACEMENT, RIGHT TRANSFEMORAL; Location: [facility]; Surgeon: Clent Jacks, MD   TUMOR EXCISION N/A 04/11/2022   Procedure: TUMOR EXCISION RECTAL;  Surgeon: Campbell Lerner, MD;  Location: [facility] ORS;  Service: General;  Laterality: N/A;   Social History   Socioeconomic History   Marital status: Widowed    Spouse name: Not on file   Number of children: Not on file   Years of education: Not on file   Highest education level: Not on file  Occupational History   Occupation: retired   Tobacco Use   Smoking status: Never    Passive exposure: Never   Smokeless tobacco: Never  Vaping Use   Vaping status: Never Used  Substance and Sexual Activity   Alcohol use: Not Currently    Alcohol/week: 21.0 standard drinks of alcohol    Types: 21 Shots of liquor per week   Drug use: No   Sexual activity: Not Currently  Other Topics Concern   Not on file  Social History Narrative   Lives alone   Social Determinants of Health   Financial Resource Strain: Low Risk  (09/10/2022)  Overall Financial Resource Strain (CARDIA)    Difficulty of Paying Living Expenses: Not hard at all  Food Insecurity: No Food Insecurity (02/08/2023)   Hunger Vital Sign    Worried About Running Out of Food in the Last Year: Never true    Ran Out of Food in the Last Year: Never true  Transportation Needs: No Transportation Needs (02/08/2023)   PRAPARE - Administrator, Civil Service (Medical): No    Lack of Transportation (Non-Medical): No  Physical Activity: Insufficiently Active (09/10/2022)   Exercise Vital Sign    Days of Exercise per Week: 3 days    Minutes of Exercise per Session: 30 min  Stress: No Stress Concern Present (09/10/2022)   [employer] of Occupational Health - Occupational Stress Questionnaire    Feeling of Stress : Not at all  Social Connections: Moderately Isolated (09/10/2022)   Social Connection and Isolation Panel [NHANES]    Frequency of Communication with Friends and Family: More than three times a week    Frequency of Social Gatherings with Friends and Family: More than three times a week    Attends Religious Services: Never    Database administrator or Organizations: Yes    Attends Engineer, structural: More than 4 times per year    Marital Status: Widowed   Family History  Problem Relation Age of Onset   Cancer Mother    Heart disease Father    Breast cancer Paternal Aunt 22   Allergies  Allergen Reactions   Tape     Prefers Paper tape be used   Prior to Admission medications   Medication Sig Start Date End Date Taking? Authorizing Provider  amLODipine (NORVASC) 5 MG tablet Take 1 tablet (5 mg total) by mouth every morning. 11/05/22  Yes Johnson, Megan P, DO  aspirin EC 81 MG tablet Take 81 mg by mouth daily.   Yes [provider]  benazepril (LOTENSIN) 20 MG tablet Take 1 tablet (20 mg total) by mouth every morning. 11/05/22  Yes Johnson, Megan P, DO  hydrochlorothiazide (MICROZIDE) 12.5 MG capsule Take 1 capsule (12.5 mg total) by mouth every morning. 11/05/22  Yes Johnson, Megan P, DO  ibuprofen (ADVIL) 200 MG tablet Take 200 mg by mouth every 6 (six) hours as needed.   Yes [provider]  methimazole (TAPAZOLE) 5 MG tablet Take by mouth. 10/04/22 10/04/23 Yes [provider]  metoprolol succinate (TOPROL-XL) 50 MG 24 hr tablet TAKE 1 TABLET BY MOUTH ONCE DAILY WITH OR IMMEDIATELY FOLLOWING A MEAL 11/05/22  Yes Johnson, Megan P, DO  Multiple Vitamin (MULTI-VITAMINS) TABS Take 1 tablet by mouth daily.    Yes [provider]  Probiotic Product (PROBIOTIC ADVANCED PO) Take 1 capsule by mouth daily. When taking antibiotics pt will take 2 capsules instead of  1, normally just takes 1 per day otherwise   Yes [provider]  vitamin B-12 (CYANOCOBALAMIN) 1000 MCG tablet Take 1,000 mcg by mouth daily.   Yes [provider]  Zinc Acetate, Oral, (ZINC ACETATE PO) Take by mouth.   Yes [provider]  silver sulfADIAZINE (SILVADENE) 1 % cream Apply 1 application topically daily. Patient not taking: Reported on 02/08/2023 07/22/17   Steele Sizer, MD  Sodium Hyaluronate, oral, (HYALURONIC ACID PO) Take 1 tablet by mouth daily.    [provider]  sulfamethoxazole-trimethoprim (BACTRIM DS) 800-160 MG tablet Take 1 tablet by mouth 2 (two) times daily. Patient not taking: Reported  on 02/08/2023 12/10/22   Larae Grooms, NP   No results found.  Positive ROS: All other systems have been reviewed and were otherwise negative with the exception of those mentioned in the HPI and as above.  Physical Exam: General:  Alert, no acute distress Psychiatric:  Patient is competent for consent with normal mood and affect   Cardiovascular:  No pedal edema Respiratory:  No wheezing, non-labored breathing GI:  Abdomen is soft and non-tender Skin:  No lesions in the area of chief complaint Neurologic:  Sensation intact distally Lymphatic:  No axillary or cervical lymphadenopathy  Orthopedic Exam:  Orthopedic examination is limited of the right shoulder and upper extremity.  Skin inspection of the shoulder is notable for moderate swelling and ecchymosis, but otherwise is unremarkable.  No erythema, abrasions, or other skin abnormalities are identified.  She has mild-moderate tenderness to palpation over the anterior and lateral aspects of the shoulder.  She has more severe pain with any attempted active or passive motion of the shoulder.  She is grossly neurovascularly intact to the right upper extremity and hand.  She has intact sensation to light touch to the axillary nerve and musculocutaneous nerve distributions.  She is able to  actively flex and extend her fingers, although she has difficulty making a tight fist and has difficulty touching her index fingertip to the thumb tip.  Sensation is slightly diminished to light touch to the tips of her fingers.  She has a 1-2+ radial pulse.  X-rays:  Recent plain radiographs and CT scan with 3D reconstructions of the right shoulder are available for review and have been reviewed by myself.  These films demonstrate a displaced fracture dislocation of the right proximal humerus.  The humeral head is sitting medial to the glenoid.  The lesser tuberosity appears to be attached to the humeral head where is the greater tuberosity is quite fragmented.  No lytic lesions or significant degenerative changes are identified.  Assessment: Displaced fracture dislocation right shoulder.  Plan: The treatment options, including both surgical and nonsurgical choices, have been discussed in detail with the patient.  She would like to proceed with surgical intervention to include a reverse right total shoulder arthroplasty.  The risks (including bleeding, infection, nerve and/or blood vessel injury, persistent or recurrent pain, stiffness of the shoulder, residual weakness/limited motion, loosening of and/or failure of the components, dislocation, need for further surgery, blood clots, strokes, heart attacks or arrhythmias, pneumonia, etc.) and benefits of the surgical procedure were discussed.  The patient states her understanding and agrees to proceed.  She agrees to a blood transfusion if necessary.  A formal written consent will be obtained by the nursing staff.  Thank you for asking me to participate in the care of this most pleasant yet unfortunate woman.  I will be happy to follow her with you.   Maryagnes Amos, MD  Beeper #:  [phone]  02/11/2023 7:50 AM

## 2023-02-11 NOTE — Plan of Care (Signed)

## 2023-02-11 NOTE — Progress Notes (Signed)
Progress Note   Patient: Maria Davies UJW:[id number] DOB: Dec 28, 1940 DOA: 02/07/2023     2 DOS: the patient was seen and examined on 02/11/2023   Brief hospital course: HPI on admission 02/07/23: "Maria Davies is a 82 y.o. 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     Pt states that she fell accidentally when she was walking to the bathroom at a large time.  She injured her right shoulder and right lower leg, causing a larege bruises in the right leg shin area.  Patient is strongly denies head or neck injury.  She does not want to CT scan of head and neck at this moment.  No loss of consciousness.  Her right shoulder pain is constant, sharp, severe, radiating to the right side of neck, aggravated by movement. ..."  ED evaluation notable for: Stable vitals Labs with K 2.3, Na 132, Cl 92, glucose 177 CBC no leukocytosis, normal Hbg and platelets  Right shoulder x-ray and CT scan showed acute comminuted intra-articular right humeral head and neck fracture with humeral head displaced anteriorly and impacted on the anterior glenoid. Hematoma of rotator cuff muscles at humeral head insertion.  Pt was admitted to the hospital with Orthopedic Surgery consulted.  Cardiology also consulted for pre-operative evaluation and risk assessment.  Further hospital course and management as outlined below.   Assessment and Plan:  * Closed fracture of right shoulder Mgmt per Orthopedic Surgery Cardiology cleared for surgery. NPO after midnight Surgery planned tomorrow  Sling and RUE non-weight-bearing Pain control Bowel regimen   Hyponatremia Initially felt hypervolemic given  BLE edema, subjective dyspnea on exertion. Na improved to 135 with IV fluids. --Hold HCTZ --Repeat BMP in AM --Stop IV fluids, will start some low rate maintenance fluids tonight while NPO for surgery --Monitor BMP daily  Hypokalemia Resolved with replacement. K 2.3 on admission was replaced. Mg 1.8 Monitor BMP  Acute on chronic diastolic CHF (congestive heart failure) (HCC) Echo this admission EF   60-65%, grade I diastolic dysfunction, mild LVH, mild MR Pt has some mild BLE edema, no dyspnea, BNP normal 63.8 Currently appears mildly hypervolemic. --Given IV Lasix 20 mg BID x 3 doses --Stopped Lasix with sodium decline --LE edema improved --Daily weights and I/O's to monitor volume status --Continue Toprol-XL   Fall at home, initial encounter Resulted in R shoulder fracture. Has right leg bruising as well. 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 Started on CIWA to monitor for any withdrawal symptoms.   --No symptoms --Stop CIWA   Obesity (BMI 30-39.9) Body mass index is 34.99 kg/m. Complicates overall care and prognosis.  Recommend lifestyle modifications including physical activity and diet for weight loss and overall long-term health.  Aortic atherosclerosis (HCC) Continue ASA  Hyperthyroidism --Methimazole   Hypertension --IV hydralazine as needed --Amlodipine, Lotensin, metoprolol  Aortic stenosis, mild S/p TAVR in 08/2020        Subjective: Pt seen awake sitting up in bed this AM.  Reports feeling well overall.  Eager to get through surgery tomorrow.  Denies acute complaints.    Physical Exam: Vitals:  02/11/23 0059 02/11/23 0500 02/11/23 0747 02/11/23 1534  BP: 133/63  (!) 147/74 (!) 124/49  Pulse: 85  93 85  Resp: 16  15 15   Temp: 98.4 F (36.9 C)  98 F (36.7 C) 98 F (36.7 C)  TempSrc:      SpO2: 97%  98% 100%   Weight:  90.1 kg    Height:       General exam: awake, alert, no acute distress Respiratory system: on room air, normal respiratory effort. Cardiovascular system: RRR, stable to improved LE edema   Gastrointestinal system: soft, NT, ND, no HSM felt, +bowel sounds. Central nervous system: A&O x3. no gross focal neurologic deficits, normal speech Extremities: RUE in sling, stable mild BLE nonpitting edema, normal tone Skin: dry, intact, normal temperature Psychiatry: normal mood, congruent affect, judgement and insight appear normal    Data Reviewed:  Notable labs ---  Na 128 >> 130 >> 135 with IV fluids Cl 97, glucose 113, Cr 0.38   Family Communication: daughter at bedside on rounds 8/23, 8/25  Disposition: Status is: Observation  Pt remains admitted pending surgery for shoulder fracture.  Likely will require SNF/rehab placement.    Planned Discharge Destination: Skilled nursing facility    Time spent: 36 minutes  Author: Pennie Banter, DO 02/11/2023 7:19 PM  For on call review [email or web address].

## 2023-02-11 NOTE — TOC Progression Note (Signed)
Transition of Care [facility]) - Progression Note    Patient Details  Name: Maria Davies MRN: [id number] Date of Birth: 08-18-1940  [facility] CM/SW Contact  Marlowe Sax, RN Phone Number: 02/11/2023, 1:57 PM  Clinical Narrative:    Surgery planned for Tuesday this week, will likely need to go to [facility] SNF after, TOC to follow and assist with needs   Expected Discharge Plan: Skilled Nursing Facility Barriers to Discharge: Continued Medical Work up  Expected Discharge Plan and Services       Living arrangements for the past 2 months: Single Family Home                                       Social Determinants of Health (SDOH) Interventions SDOH Screenings   Food Insecurity: No Food Insecurity (02/08/2023)  Housing: Low Risk  (02/08/2023)  Transportation Needs: No Transportation Needs (02/08/2023)  Utilities: Not At Risk (02/08/2023)  Alcohol Screen: Low Risk  (09/10/2022)  Depression (PHQ2-9): Low Risk  (11/05/2022)  Financial Resource Strain: Low Risk  (09/10/2022)  Physical Activity: Insufficiently Active (09/10/2022)  Social Connections: Moderately Isolated (09/10/2022)  Stress: No Stress Concern Present (09/10/2022)  Tobacco Use: Low Risk  (02/07/2023)    Readmission Risk Interventions    02/08/2023    1:10 PM  Readmission Risk Prevention Plan  Transportation Screening Complete  PCP or Specialist Appt within 5-7 Days Complete  Home Care Screening Complete  Medication Review (RN CM) Complete

## 2023-02-12 ENCOUNTER — Inpatient Hospital Stay: Payer: Medicare Other

## 2023-02-12 ENCOUNTER — Inpatient Hospital Stay: Payer: Medicare Other | Admitting: Anesthesiology

## 2023-02-12 ENCOUNTER — Encounter
Admission: EM | Disposition: A | Discharge status: Skilled Nursing Facility | Payer: Self-pay | Source: Home / Self Care | Attending: Internal Medicine

## 2023-02-12 ENCOUNTER — Other Ambulatory Visit: Payer: Self-pay

## 2023-02-12 ENCOUNTER — Encounter: Payer: Self-pay | Admitting: Surgery

## 2023-02-12 DIAGNOSIS — S4291XA Fracture of right shoulder girdle, part unspecified, initial encounter for closed fracture: Secondary | ICD-10-CM | POA: Diagnosis not present

## 2023-02-12 DIAGNOSIS — E876 Hypokalemia: Secondary | ICD-10-CM | POA: Diagnosis not present

## 2023-02-12 HISTORY — PX: REVERSE SHOULDER ARTHROPLASTY: SHX5054

## 2023-02-12 LAB — BASIC METABOLIC PANEL
Anion gap: 7 (ref 5–15)
BUN: 6 mg/dL — ABNORMAL LOW (ref 8–23)
CO2: 29 mmol/L (ref 22–32)
Calcium: 8.8 mg/dL — ABNORMAL LOW (ref 8.9–10.3)
Chloride: 99 mmol/L (ref 98–111)
Creatinine, Ser: 0.38 mg/dL — ABNORMAL LOW (ref 0.44–1.00)
GFR, Estimated: >60 mL/min (ref >60)
Glucose, Bld: 107 mg/dL — ABNORMAL HIGH (ref 70–99)
Potassium: 3.4 mmol/L — ABNORMAL LOW (ref 3.5–5.1)
Sodium: 135 mmol/L (ref 135–145)

## 2023-02-12 LAB — MAGNESIUM: Magnesium: 2 mg/dL (ref 1.7–2.4)

## 2023-02-12 SURGERY — ARTHROPLASTY, SHOULDER, TOTAL, REVERSE
Anesthesia: General | Site: Shoulder | Laterality: Right

## 2023-02-12 MED ORDER — PROPOFOL 10 MG/ML IV BOLUS
INTRAVENOUS | Status: AC
  Filled 2023-02-12: qty 20

## 2023-02-12 MED ORDER — FENTANYL CITRATE (PF) 100 MCG/2ML IJ SOLN: 25.0000 ug | INTRAMUSCULAR | Status: DC | PRN

## 2023-02-12 MED ORDER — BUPIVACAINE LIPOSOME 1.3 % IJ SUSP
INTRAMUSCULAR | Status: DC | PRN
  Administered 2023-02-12: 20 mL

## 2023-02-12 MED ORDER — PROPOFOL 500 MG/50ML IV EMUL
INTRAVENOUS | Status: DC | PRN
  Administered 2023-02-12: 80 ug/kg/min via INTRAVENOUS

## 2023-02-12 MED ORDER — SODIUM CHLORIDE 0.9 % IR SOLN
Status: DC | PRN
  Administered 2023-02-12: 3000 mL

## 2023-02-12 MED ORDER — KETOROLAC TROMETHAMINE 15 MG/ML IJ SOLN
7.5000 mg | Freq: Four times a day (QID) | INTRAMUSCULAR | Status: AC
  Administered 2023-02-12 – 2023-02-13 (×4): 7.5 mg via INTRAVENOUS
  Filled 2023-02-12 (×4): qty 1

## 2023-02-12 MED ORDER — FENTANYL CITRATE PF 50 MCG/ML IJ SOSY
50.0000 ug | PREFILLED_SYRINGE | Freq: Once | INTRAMUSCULAR | Status: AC
  Administered 2023-02-12: 50 ug via INTRAVENOUS

## 2023-02-12 MED ORDER — BUPIVACAINE HCL (PF) 0.5 % IJ SOLN
INTRAMUSCULAR | Status: AC
  Filled 2023-02-12: qty 10

## 2023-02-12 MED ORDER — BUPIVACAINE HCL (PF) 0.5 % IJ SOLN
INTRAMUSCULAR | Status: DC | PRN
  Administered 2023-02-12: 10 mL

## 2023-02-12 MED ORDER — TRANEXAMIC ACID-NACL 1000-0.7 MG/100ML-% IV SOLN
INTRAVENOUS | Status: DC | PRN
Start: 2023-02-12 — End: 2023-02-12
  Administered 2023-02-12: 1000 mg via INTRAVENOUS

## 2023-02-12 MED ORDER — BUPIVACAINE-EPINEPHRINE (PF) 0.5% -1:200000 IJ SOLN
INTRAMUSCULAR | Status: DC | PRN
  Administered 2023-02-12: 30 mL

## 2023-02-12 MED ORDER — LACTATED RINGERS IV SOLN: INTRAVENOUS | Status: DC | PRN

## 2023-02-12 MED ORDER — ROCURONIUM BROMIDE 100 MG/10ML IV SOLN
INTRAVENOUS | Status: DC | PRN
  Administered 2023-02-12: 50 mg via INTRAVENOUS
  Administered 2023-02-12: 30 mg via INTRAVENOUS

## 2023-02-12 MED ORDER — ONDANSETRON HCL 4 MG/2ML IJ SOLN: 4.0000 mg | Freq: Four times a day (QID) | INTRAMUSCULAR | Status: DC | PRN

## 2023-02-12 MED ORDER — DOCUSATE SODIUM 100 MG PO CAPS
100.0000 mg | ORAL_CAPSULE | Freq: Two times a day (BID) | ORAL | Status: DC
  Administered 2023-02-12 – 2023-02-14 (×4): 100 mg via ORAL
  Filled 2023-02-12 (×4): qty 1

## 2023-02-12 MED ORDER — TRANEXAMIC ACID 1000 MG/10ML IV SOLN: INTRAVENOUS | Status: DC | PRN

## 2023-02-12 MED ORDER — MIDAZOLAM HCL 2 MG/2ML IJ SOLN
1.0000 mg | Freq: Once | INTRAMUSCULAR | Status: AC
  Administered 2023-02-12: 1 mg via INTRAVENOUS

## 2023-02-12 MED ORDER — DEXAMETHASONE SODIUM PHOSPHATE 10 MG/ML IJ SOLN
INTRAMUSCULAR | Status: AC
  Filled 2023-02-12: qty 1

## 2023-02-12 MED ORDER — OXYCODONE HCL 5 MG/5ML PO SOLN: 5.0000 mg | Freq: Once | ORAL | Status: DC | PRN

## 2023-02-12 MED ORDER — FENTANYL CITRATE (PF) 100 MCG/2ML IJ SOLN
INTRAMUSCULAR | Status: AC
  Filled 2023-02-12: qty 2

## 2023-02-12 MED ORDER — TRANEXAMIC ACID-NACL 1000-0.7 MG/100ML-% IV SOLN
INTRAVENOUS | Status: AC
  Filled 2023-02-12: qty 100

## 2023-02-12 MED ORDER — BUPIVACAINE LIPOSOME 1.3 % IJ SUSP
INTRAMUSCULAR | Status: AC
  Filled 2023-02-12: qty 20

## 2023-02-12 MED ORDER — BISACODYL 10 MG RE SUPP: 10.0000 mg | Freq: Every day | RECTAL | Status: DC | PRN

## 2023-02-12 MED ORDER — EPINEPHRINE PF 1 MG/ML IJ SOLN
INTRAMUSCULAR | Status: AC
  Filled 2023-02-12: qty 1

## 2023-02-12 MED ORDER — ONDANSETRON HCL 4 MG/2ML IJ SOLN
INTRAMUSCULAR | Status: DC | PRN
  Administered 2023-02-12: 4 mg via INTRAVENOUS

## 2023-02-12 MED ORDER — METOCLOPRAMIDE HCL 5 MG/ML IJ SOLN: 5.0000 mg | Freq: Three times a day (TID) | INTRAMUSCULAR | Status: DC | PRN

## 2023-02-12 MED ORDER — ACETAMINOPHEN 500 MG PO TABS
1000.0000 mg | ORAL_TABLET | Freq: Four times a day (QID) | ORAL | Status: AC
  Administered 2023-02-12 – 2023-02-13 (×4): 1000 mg via ORAL
  Filled 2023-02-12 (×4): qty 2

## 2023-02-12 MED ORDER — PHENYLEPHRINE 80 MCG/ML (10ML) SYRINGE FOR IV PUSH (FOR BLOOD PRESSURE SUPPORT): PREFILLED_SYRINGE | INTRAVENOUS | Status: DC | PRN

## 2023-02-12 MED ORDER — DEXAMETHASONE SODIUM PHOSPHATE 10 MG/ML IJ SOLN
INTRAMUSCULAR | Status: DC | PRN
  Administered 2023-02-12: 5 mg via INTRAVENOUS

## 2023-02-12 MED ORDER — PHENYLEPHRINE HCL (PRESSORS) 10 MG/ML IV SOLN
INTRAVENOUS | Status: DC | PRN
  Administered 2023-02-12: 50 ug via INTRAVENOUS
  Administered 2023-02-12: 160 ug via INTRAVENOUS

## 2023-02-12 MED ORDER — ENOXAPARIN SODIUM 40 MG/0.4ML IJ SOSY
40.0000 mg | PREFILLED_SYRINGE | INTRAMUSCULAR | Status: DC
  Administered 2023-02-13 – 2023-02-14 (×2): 40 mg via SUBCUTANEOUS
  Filled 2023-02-12 (×2): qty 0.4

## 2023-02-12 MED ORDER — PROPOFOL 10 MG/ML IV BOLUS
INTRAVENOUS | Status: DC | PRN
  Administered 2023-02-12: 100 mg via INTRAVENOUS

## 2023-02-12 MED ORDER — ONDANSETRON HCL 4 MG PO TABS: 4.0000 mg | ORAL_TABLET | Freq: Four times a day (QID) | ORAL | Status: DC | PRN

## 2023-02-12 MED ORDER — MAGNESIUM HYDROXIDE 400 MG/5ML PO SUSP: 30.0000 mL | Freq: Every day | ORAL | Status: DC | PRN

## 2023-02-12 MED ORDER — POTASSIUM CHLORIDE CRYS ER 20 MEQ PO TBCR
40.0000 meq | EXTENDED_RELEASE_TABLET | Freq: Once | ORAL | Status: DC
  Filled 2023-02-12: qty 2

## 2023-02-12 MED ORDER — ACETAMINOPHEN 325 MG PO TABS: 325.0000 mg | ORAL_TABLET | Freq: Four times a day (QID) | ORAL | Status: DC | PRN

## 2023-02-12 MED ORDER — DIPHENHYDRAMINE HCL 12.5 MG/5ML PO ELIX: 12.5000 mg | ORAL_SOLUTION | ORAL | Status: DC | PRN

## 2023-02-12 MED ORDER — 0.9 % SODIUM CHLORIDE (POUR BTL) OPTIME
TOPICAL | Status: DC | PRN
  Administered 2023-02-12: 500 mL

## 2023-02-12 MED ORDER — KETOROLAC TROMETHAMINE 15 MG/ML IJ SOLN
15.0000 mg | Freq: Once | INTRAMUSCULAR | Status: AC
  Administered 2023-02-12: 15 mg via INTRAVENOUS

## 2023-02-12 MED ORDER — PROPOFOL 1000 MG/100ML IV EMUL
INTRAVENOUS | Status: AC
  Filled 2023-02-12: qty 100

## 2023-02-12 MED ORDER — BUPIVACAINE HCL (PF) 0.5 % IJ SOLN
INTRAMUSCULAR | Status: AC
  Filled 2023-02-12: qty 30

## 2023-02-12 MED ORDER — FENTANYL CITRATE (PF) 100 MCG/2ML IJ SOLN
INTRAMUSCULAR | Status: DC | PRN
  Administered 2023-02-12 (×2): 25 ug via INTRAVENOUS
  Administered 2023-02-12: 50 ug via INTRAVENOUS

## 2023-02-12 MED ORDER — SEVOFLURANE IN SOLN
RESPIRATORY_TRACT | Status: AC
  Filled 2023-02-12: qty 250

## 2023-02-12 MED ORDER — OXYCODONE HCL 5 MG PO TABS: 5.0000 mg | ORAL_TABLET | Freq: Once | ORAL | Status: DC | PRN

## 2023-02-12 MED ORDER — LIDOCAINE HCL (CARDIAC) PF 100 MG/5ML IV SOSY
PREFILLED_SYRINGE | INTRAVENOUS | Status: DC | PRN
  Administered 2023-02-12: 10 mg via INTRAVENOUS

## 2023-02-12 MED ORDER — FENTANYL CITRATE PF 50 MCG/ML IJ SOSY
PREFILLED_SYRINGE | INTRAMUSCULAR | Status: AC
  Filled 2023-02-12: qty 1

## 2023-02-12 MED ORDER — ESMOLOL HCL 100 MG/10ML IV SOLN
INTRAVENOUS | Status: DC | PRN
  Administered 2023-02-12: 20 mg via INTRAVENOUS

## 2023-02-12 MED ORDER — ONDANSETRON HCL 4 MG/2ML IJ SOLN
INTRAMUSCULAR | Status: AC
  Filled 2023-02-12: qty 2

## 2023-02-12 MED ORDER — EPHEDRINE SULFATE (PRESSORS) 50 MG/ML IJ SOLN
INTRAMUSCULAR | Status: DC | PRN
  Administered 2023-02-12: 10 mg via INTRAVENOUS

## 2023-02-12 MED ORDER — OXYCODONE HCL 5 MG PO TABS
5.0000 mg | ORAL_TABLET | ORAL | Status: DC | PRN
  Administered 2023-02-12 – 2023-02-13 (×2): 10 mg via ORAL
  Filled 2023-02-12 (×2): qty 2
  Filled 2023-02-12: qty 1

## 2023-02-12 MED ORDER — FLEET ENEMA RE ENEM: 1.0000 | ENEMA | Freq: Once | RECTAL | Status: DC | PRN

## 2023-02-12 MED ORDER — MIDAZOLAM HCL 2 MG/2ML IJ SOLN
INTRAMUSCULAR | Status: AC
  Filled 2023-02-12: qty 2

## 2023-02-12 MED ORDER — PHENYLEPHRINE HCL-NACL 20-0.9 MG/250ML-% IV SOLN
INTRAVENOUS | Status: DC | PRN
  Administered 2023-02-12: 50 ug/min via INTRAVENOUS

## 2023-02-12 MED ORDER — KETOROLAC TROMETHAMINE 15 MG/ML IJ SOLN
INTRAMUSCULAR | Status: AC
  Filled 2023-02-12: qty 1

## 2023-02-12 MED ORDER — METOCLOPRAMIDE HCL 5 MG PO TABS: 5.0000 mg | ORAL_TABLET | Freq: Three times a day (TID) | ORAL | Status: DC | PRN

## 2023-02-12 MED ORDER — SUGAMMADEX SODIUM 200 MG/2ML IV SOLN
INTRAVENOUS | Status: DC | PRN
  Administered 2023-02-12: 200 mg via INTRAVENOUS

## 2023-02-12 MED ORDER — CEFAZOLIN SODIUM-DEXTROSE 2-4 GM/100ML-% IV SOLN
INTRAVENOUS | Status: AC
  Filled 2023-02-12: qty 100

## 2023-02-12 MED ORDER — CEFAZOLIN SODIUM-DEXTROSE 2-4 GM/100ML-% IV SOLN
2.0000 g | Freq: Four times a day (QID) | INTRAVENOUS | Status: AC
  Administered 2023-02-12 – 2023-02-13 (×3): 2 g via INTRAVENOUS
  Filled 2023-02-12 (×3): qty 100

## 2023-02-12 SURGICAL SUPPLY — 75 items
APL PRP STRL LF DISP 70% ISPRP (MISCELLANEOUS) ×1
BASEPLATE BOSS DRILL (MISCELLANEOUS) IMPLANT
BIT DRILL 2.5 (BIT) ×1
BIT DRILL 2.5X4.5XSCR (BIT) IMPLANT
BIT DRILL BASEPLATE CENTRAL S (BIT) IMPLANT
BIT DRL 2.5X4.5XSCR (BIT) ×1
BLADE SAW SAG 25X90X1.19 (BLADE) ×1 IMPLANT
BSPLAT GLND THK2 22.8X27.3 (Plate) ×1 IMPLANT
CHLORAPREP W/TINT 26 (MISCELLANEOUS) ×1 IMPLANT
COOLER POLAR GLACIER W/PUMP (MISCELLANEOUS) ×1 IMPLANT
DRAPE INCISE IOBAN 66X45 STRL (DRAPES) ×1 IMPLANT
DRAPE SHEET LG 3/4 BI-LAMINATE (DRAPES) ×1 IMPLANT
DRAPE TABLE BACK 80X90 (DRAPES) ×1 IMPLANT
DRSG OPSITE POSTOP 4X8 (GAUZE/BANDAGES/DRESSINGS) ×1 IMPLANT
ELECT BLADE 6.5 EXT (BLADE) IMPLANT
ELECT CAUTERY BLADE 6.4 (BLADE) ×1 IMPLANT
ELECT REM PT RETURN 9FT ADLT (ELECTROSURGICAL) ×1
ELECTRODE REM PT RTRN 9FT ADLT (ELECTROSURGICAL) ×1 IMPLANT
GAUZE XEROFORM 1X8 LF (GAUZE/BANDAGES/DRESSINGS) ×1 IMPLANT
GLENOSPHERE RSS CONCENTRIC-S 5 (Shoulder) IMPLANT
GLOVE BIO SURGEON STRL SZ7.5 (GLOVE) ×4 IMPLANT
GLOVE BIO SURGEON STRL SZ8 (GLOVE) ×4 IMPLANT
GLOVE BIOGEL PI IND STRL 8 (GLOVE) ×2 IMPLANT
GLOVE INDICATOR 8.0 STRL GRN (GLOVE) ×1 IMPLANT
GOWN STRL REUS W/ TWL LRG LVL3 (GOWN DISPOSABLE) ×1 IMPLANT
GOWN STRL REUS W/ TWL XL LVL3 (GOWN DISPOSABLE) ×1 IMPLANT
GOWN STRL REUS W/TWL LRG LVL3 (GOWN DISPOSABLE) ×1
GOWN STRL REUS W/TWL XL LVL3 (GOWN DISPOSABLE) ×1
GUIDE PIN 2.0 S150MM (PIN) IMPLANT
HANDLE YANKAUER SUCT OPEN TIP (MISCELLANEOUS) ×1 IMPLANT
HOOD PEEL AWAY T7 (MISCELLANEOUS) ×3 IMPLANT
IV NS IRRIG 3000ML ARTHROMATIC (IV SOLUTION) ×1 IMPLANT
KIT STABILIZATION SHOULDER (MISCELLANEOUS) ×1 IMPLANT
KIT TURNOVER KIT A (KITS) ×1 IMPLANT
LINER STD +3S RSS HXL (Liner) IMPLANT
MANIFOLD NEPTUNE II (INSTRUMENTS) ×1 IMPLANT
MASK FACE SPIDER DISP (MASK) ×1 IMPLANT
MAT ABSORB FLUID 56X50 GRAY (MISCELLANEOUS) ×1 IMPLANT
NDL MAYO CATGUT SZ 2 (NEEDLE) IMPLANT
NDL MAYO CATGUT SZ1 (NEEDLE) IMPLANT
NDL SAFETY ECLIP 18X1.5 (MISCELLANEOUS) ×1 IMPLANT
NDL SPNL 20GX3.5 QUINCKE YW (NEEDLE) ×1 IMPLANT
NEEDLE MAYO CATGUT SZ 1.5 (NEEDLE) ×1
NEEDLE MAYO CATGUT SZ 2 (NEEDLE) ×1 IMPLANT
NEEDLE MAYO CATGUT SZ1 (NEEDLE) IMPLANT
NEEDLE SPNL 20GX3.5 QUINCKE YW (NEEDLE) ×1 IMPLANT
NS IRRIG 500ML POUR BTL (IV SOLUTION) ×1 IMPLANT
PACK ARTHROSCOPY SHOULDER (MISCELLANEOUS) ×1 IMPLANT
PAD ARMBOARD 7.5X6 YLW CONV (MISCELLANEOUS) ×1 IMPLANT
PAD WRAPON POLAR SHDR UNIV (MISCELLANEOUS) ×1 IMPLANT
PLATE BASE REVERSE RSS S (Plate) IMPLANT
PULSAVAC PLUS IRRIG FAN TIP (DISPOSABLE) ×1
SCREW 4.5X15 RSS W CAP (Screw) IMPLANT
SCREW 4.5X25 RSS W CAP (Screw) IMPLANT
SCREW 4.5X30 RSS W CAP (Screw) IMPLANT
SCREW 4.5X35 RSS W CAP (Screw) IMPLANT
SCREW BODY REVERSE STD (Screw) IMPLANT
SLING ULTRA II M (MISCELLANEOUS) IMPLANT
SPONGE T-LAP 18X18 ~~LOC~~+RFID (SPONGE) ×2 IMPLANT
STAPLER SKIN PROX 35W (STAPLE) ×1 IMPLANT
STEM PRESS FIT SZ 08 TSS (Stem) IMPLANT
SUT ETHIBOND 0 MO6 C/R (SUTURE) ×1 IMPLANT
SUT FIBERWIRE #2 38 BLUE 1/2 (SUTURE) ×5
SUT VIC AB 0 CT1 36 (SUTURE) ×1 IMPLANT
SUT VIC AB 2-0 CT1 27 (SUTURE) ×4
SUT VIC AB 2-0 CT1 TAPERPNT 27 (SUTURE) ×2 IMPLANT
SUTURE FIBERWR #2 38 BLUE 1/2 (SUTURE) ×4 IMPLANT
SYR 10ML LL (SYRINGE) ×1 IMPLANT
SYR 30ML LL (SYRINGE) ×1 IMPLANT
SYR TOOMEY 50ML (SYRINGE) ×1 IMPLANT
TIP FAN IRRIG PULSAVAC PLUS (DISPOSABLE) ×1 IMPLANT
TRAP FLUID SMOKE EVACUATOR (MISCELLANEOUS) ×1 IMPLANT
WATER STERILE IRR 1000ML POUR (IV SOLUTION) IMPLANT
WATER STERILE IRR 500ML POUR (IV SOLUTION) ×1 IMPLANT
WRAPON POLAR PAD SHDR UNIV (MISCELLANEOUS) ×1

## 2023-02-12 NOTE — Op Note (Signed)
02/07/2023 - 02/12/2023  1:26 PM  Patient:   Maria Davies  Pre-Op Diagnosis:   Displaced fracture dislocation of right shoulder.  Post-Op Diagnosis:   Same.  Procedure:   Reverse right total shoulder arthroplasty.  Surgeon:   Maryagnes Amos, MD  Assistant:   Horris Latino, PA-C  Anesthesia:   General endotracheal with an interscalene block using Exparel placed preoperatively by the anesthesiologist.  Findings:   As above.  Complications:   None  EBL:   250 cc  Fluids:   800 cc crystalloid  UOP:   None  TT:   None  Drains:   None  Closure:   Staples  Implants:   All press-fit Integra system with an 8 mm stem, a standard metaphyseal body, a +3 mm humeral platform, a mini baseplate, and a 38 mm concentric +5 mm laterally offset glenosphere.  Brief Clinical Note:   The patient is a 82 year old female who sustained the above-noted injury 5 days ago when she tripped and fell on her way to the bathroom in her home. She was brought to the emergency room where x-rays demonstrated the above-noted injury. The patient presents at this time for a reverse right total shoulder arthroplasty.  Procedure:   The patient underwent placement of an interscalene block using Exparel by the anesthesiologist in the preoperative holding area before being brought into the operating room and lain in the supine position. The patient then underwent general endotracheal intubation and anesthesia before the patient was repositioned in the beach chair position using the beach chair positioner. The right shoulder and upper extremity were prepped with ChloraPrep solution before being draped sterilely. Preoperative antibiotics were administered.   A timeout was performed to verify the appropriate surgical site before a standard anterior approach to the shoulder was made through an approximately 4-5 inch incision. The incision was carried down through the subcutaneous tissues to expose the deltopectoral fascia.  The interval between the deltoid and pectoralis muscles was identified and this plane developed, retracting the cephalic vein laterally with the deltoid muscle.  During the dissection of the cephalic vein, the vein tore so it was tied off. The conjoined tendon was identified. Its lateral margin was dissected and the Kolbel self-retraining retractor inserted. Bursal tissues and fibrous clot tissues were removed to improve visualization and to better define the anatomy.   The biceps tendon was identified near the inferior aspect of the bicipital groove. The biceps was tracked proximally releasing the bicipital sheath.  This release was extended through the rotator interval proximally. The biceps tendon was then transected just proximal to the superior border of the pectoralis major tendon. The lesser tuberosity was elevated and separated from the remainder of the humeral head at the articular margin to enable the lesser tuberosity with the subscapularis tendon attached to be retracted medially. The remainder of the humeral head was then elevated into the wound and removed. The remnants of the greater tuberosity with attached supraspinatus infraspinatus tendons were identified and tagged with several #0 Ethibond interrupted sutures.     Attention was redirected to the glenoid. The labrum was debrided circumferentially before the center of the glenoid was identified. The guidewire was drilled into the glenoid neck using the appropriate guide. After verifying its position, it was overreamed with the mini-baseplate reamer to create a flat surface before the stem reamer was utilized. The superior and inferior peg sites were reamed using the appropriate guide to complete the glenoid preparation. The permanent mini-baseplate was impacted  into place. It was stabilized with a 25 x 4.5 mm central screw and four peripheral screws. Locking caps were placed over the superior and inferior screws. The permanent 38 mm concentric  glenosphere with +5 mm of lateral offset was then impacted into place and its Morse taper locking mechanism verified using manual distraction.  Attention was directed to the humeral side. The humeral canal was prepared utilizing the tapered stem reamers sequentially beginning with the 6 mm stem and progressing to an 8 mm stem. This demonstrated a good tight fit. The metaphyseal region was then prepared using the appropriate planar device. The trial stem and standard metaphyseal body were put together on the back table and a trial reduction performed using the +0 mm and +3 mm inserts. With the +3 mm insert, the arm demonstrated excellent range of motion as the hand could be brought across the chest to the opposite shoulder and brought to the top of the patient's head and to the patient's ear. The shoulder appeared stable throughout this range of motion. The joint was dislocated and the trial components removed.  At this point, two #2 FiberWire sutures were passed through drill holes placed along the antero-lateral aspect of the humeral shaft to help secure the tuberosities later.   The permanent 8 mm stem with the standard body was impacted into place with care taken to maintain the appropriate version. A repeat trial reduction with the +3 mm insert again demonstrated excellent stability with the findings as described above. Therefore, the shoulder was re-dislocated and, after inserting the locking screw to secure the body to the stem, the permanent +3 mm insert impacted into place. After verifying its locking mechanism, the shoulder was relocated using two finger pressure and again placed through a range of motion with the findings as described above.  The wound was copiously irrigated with sterile saline solution using the jet lavage system before a total of 30 cc of 0.5% Sensorcaine with epinephrine was injected into the pericapsular and peri-incisional tissues to help with postoperative analgesia. The  greater tuberosity fragments with their supraspinatus and infraspinatus tendon attachments were secured to the humeral stem using several #2 FiberWire interrupted sutures. The subscapularis tendon with the lesser tuberosity fragment also was reapproximated to the humeral component using two #2 FiberWire interrupted sutures. Next the first of the two sutures that had been placed through the humeral shaft was passed through the subscapularis tendon/lesser tuberosity and tied securely before the second suture was placed in an "around the world" fashion, securing both the greater and lesser tuberosity fragments to complete the reapproximation of the rotator cuff to the humeral component.   The deltopectoral interval was closed using #0 Vicryl interrupted sutures before the subcutaneous tissues were closed using 2-0 Vicryl interrupted sutures. The skin was closed using staples. A sterile occlusive dressing was applied to the wound before the arm was placed into a shoulder immobilizer with an abduction pillow. A Polar Care system also was applied to the shoulder. The patient was then transferred back to a hospital bed before being awakened, extubated, and returned to the recovery room in satisfactory condition after tolerating the procedure well.

## 2023-02-12 NOTE — Plan of Care (Signed)
  Problem: Activity: Goal: Risk for activity intolerance will decrease Outcome: Progressing   Problem: Coping: Goal: Level of anxiety will decrease Outcome: Progressing   Problem: Safety: Goal: Ability to remain free from injury will improve Outcome: Progressing   Problem: Skin Integrity: Goal: Risk for impaired skin integrity will decrease Outcome: Progressing   

## 2023-02-12 NOTE — Anesthesia Postprocedure Evaluation (Signed)
Anesthesia Post Note  Patient: Maria Davies  Procedure(s) Performed: REVERSE SHOULDER ARTHROPLASTY (Right: Shoulder)  Patient location during evaluation: PACU Anesthesia Type: General Level of consciousness: awake and alert Pain management: pain level controlled Vital Signs Assessment: post-procedure vital signs reviewed and stable Respiratory status: spontaneous breathing, nonlabored ventilation, respiratory function stable and patient connected to nasal cannula oxygen Cardiovascular status: blood pressure returned to baseline and stable Postop Assessment: no apparent nausea or vomiting Anesthetic complications: no  No notable events documented.   Last Vitals:  Vitals:   02/12/23 1430 02/12/23 1455  BP: 116/75 117/73  Pulse: 100 (!) 109  Resp: 18 16  Temp: 36.6 C (!) 36.4 C  SpO2: 93% 96%    Last Pain:  Vitals:   02/12/23 1430  TempSrc:   PainSc: 0-No pain                 Stephanie Coup

## 2023-02-12 NOTE — Anesthesia Procedure Notes (Signed)
Procedure Name: Intubation Date/Time: 02/12/2023 10:45 AM  Performed by: Monico Hoar, CRNAPre-anesthesia Checklist: Patient identified, Patient being monitored, Timeout performed, Emergency Drugs available and Suction available Patient Re-evaluated:Patient Re-evaluated prior to induction Oxygen Delivery Method: Circle system utilized Preoxygenation: Pre-oxygenation with 100% oxygen Induction Type: IV induction Ventilation: Mask ventilation without difficulty Laryngoscope Size: Mac and 4 Grade View: Grade I Tube type: Oral Tube size: 6.5 mm Number of attempts: 1 Airway Equipment and Method: Stylet Placement Confirmation: ETT inserted through vocal cords under direct vision, positive ETCO2 and breath sounds checked- equal and bilateral Secured at: 21 cm Tube secured with: Tape Dental Injury: Teeth and Oropharynx as per pre-operative assessment

## 2023-02-12 NOTE — Transfer of Care (Signed)
Immediate Anesthesia Transfer of Care Note  Patient: Maria Davies  Procedure(s) Performed: REVERSE SHOULDER ARTHROPLASTY (Right: Shoulder)  Patient Location: PACU  Anesthesia Type:General  Level of Consciousness: drowsy  Airway & Oxygen Therapy: Patient Spontanous Breathing and Patient connected to face mask oxygen  Post-op Assessment: Report given to RN and Post -op Vital signs reviewed and stable  Post vital signs: Reviewed and stable  Last Vitals:  Vitals Value Taken Time  BP 116/75 02/12/23 1430  Temp 36.6 C 02/12/23 1430  Pulse 105 02/12/23 1435  Resp 23 02/12/23 1435  SpO2 96 % 02/12/23 1435  Vitals shown include unfiled device data.  Last Pain:  Vitals:   02/12/23 1430  TempSrc:   PainSc: 0-No pain         Complications: No notable events documented.

## 2023-02-12 NOTE — Anesthesia Procedure Notes (Signed)
Anesthesia Regional Block: Interscalene brachial plexus block   Pre-Anesthetic Checklist: , timeout performed,  Correct Patient, Correct Site, Correct Laterality,  Correct Procedure, Correct Position, site marked,  Risks and benefits discussed,  Surgical consent,  Pre-op evaluation,  At surgeon's request and post-op pain management  Laterality: Right  Prep: chloraprep       Needles:  Injection technique: Single-shot  Needle Type: Echogenic Needle     Needle Length: 4cm  Needle Gauge: 25     Additional Needles:   Procedures:,,,, ultrasound used (permanent image in chart),,    Narrative:  Start time: 02/12/2023 10:18 AM End time: 02/12/2023 10:20 AM Injection made incrementally with aspirations every 5 mL.  Performed by: Personally  Anesthesiologist: Stephanie Coup, MD  Additional Notes: Patient's chart reviewed and they were deemed appropriate candidate for procedure, at surgeon's request. Patient educated about risks, benefits, and alternatives of the block including but not limited to: temporary or permanent nerve damage, bleeding, infection, damage to surround tissues, pneumothorax, hemidiaphragmatic paralysis, unilateral Horner's syndrome, block failure, local anesthetic toxicity. Patient expressed understanding. A formal time-out was conducted consistent with institution rules.  Monitors were applied, and minimal sedation used (see nursing record). The site was prepped with skin prep and allowed to dry, and sterile gloves were used. A high frequency linear ultrasound probe with probe cover was utilized throughout. C5-7 nerve roots located and appeared anatomically normal, local anesthetic injected around them, and echogenic block needle trajectory was monitored throughout. Aspiration performed every 5ml. Lung and blood vessels were avoided. All injections were performed without resistance and free of blood and paresthesias. The patient tolerated the procedure well.  Injectate:  20ml exparel + 10ml 0.5% bupivacaine

## 2023-02-12 NOTE — Anesthesia Preprocedure Evaluation (Signed)
Anesthesia Evaluation  Patient identified by MRN, date of birth, ID band Patient awake    Reviewed: Allergy & Precautions, NPO status , Patient's Chart, lab work & pertinent test results  History of Anesthesia Complications (+) PONV and history of anesthetic complications  Airway Mallampati: III  TM Distance: >3 FB Neck ROM: full    Dental  (+) Chipped, Dental Advidsory Given   Pulmonary neg pulmonary ROS, neg sleep apnea, neg COPD, Patient abstained from smoking.Not current smoker   Pulmonary exam normal breath sounds clear to auscultation       Cardiovascular Exercise Tolerance: Good hypertension, + CAD and +CHF  (-) Past MI Normal cardiovascular exam+ dysrhythmias + pacemaker + Valvular Problems/Murmurs  Rhythm:Regular Rate:Normal - Systolic murmurs ? TTE performed on 08/25/2018 revealed normal left trickle systolic function with an EF of >55%.  There was moderate concentric LVH. Left ventricular diastolic Doppler parameters consistent with abnormal relaxation (G1DD).  There was trivial to mild pan valvular regurgitation noted.  Degree of aortic valve stenosis had progressed to moderate with a mean pressure gradient of 42.3 mmHg; AVA (VTI) = 0.82 cm.  ? Repeat TTE performed on 05/30/2020 revealed a normal left trickle systolic function with an EF of >55%.  Left atrium moderately dilated.  There was trivial mitral and tricuspid valve regurgitation.  Degree of stenosis had progressed to severe with a mean pressure gradient of 47.2 mmHg; AVA (VTI) = 0.59 cm.  ? Diagnostic RIGHT/LEFT heart catheterization was performed on 08/11/2020.  There was no evidence of obstructive coronary artery disease.  Wedge pressure was mildly elevated.  Cardiac output and index were elevated in the context of anemia (hemoglobin 9 g/dL).  There was no evidence of intra-atrial shunting noted.  ? Patient ultimately underwent TAVR procedure via a RIGHT  percutaneous femoral approach at [facility] on 08/31/2020.  A 23 mm Edwards SAPIEN 3 ultra bioprosthetic valve was placed.  Procedure was complicated by intra/postoperative complete heart block requiring placement of a transvenous pacemaker.  Postoperatively, when unable to discontinue transvenous pacing, electrophysiology was consulted and the decision was made to place a permanent implanted cardiac device.  Medtronic device, with LEFT bundle lead, was placed on 09/03/2020.  ? Most recent TTE was performed on 12/11/2021 revealing a left ventricular systolic function with moderate concentric LVH; LVEF >55%. Left ventricular diastolic Doppler parameters consistent with abnormal relaxation (G1DD).  There was mild mitral annular calcification.  There was mild biatrial and mild right ventricular enlargement.  Trivial to mild mitral, tricuspid, and pulmonary valve regurgitation noted.  Bioprosthetic aortic valve well-seated and noted to be functioning properly; mean transit bioprosthetic valve gradient was 30.4 mmHg; AVA (VTI) = 1.3 cm.   Neuro/Psych negative neurological ROS  negative psych ROS   GI/Hepatic negative GI ROS, Neg liver ROS,neg GERD  ,,  Endo/Other  negative endocrine ROSneg diabetes    Renal/GU      Musculoskeletal   Abdominal   Peds  Hematology negative hematology ROS (+)   Anesthesia Other Findings Past Medical History: 10/20/2019: Adenocarcinoma of colon (HCC)     Comment:  a.) stage IIIb; b.) RIGHT colon mass Bx (+) for invasive              moderately differentiated adenocarcinoma (pT3 pN1a) No date: Anemia No date: Aortic atherosclerosis (HCC) No date: Aortic stenosis     Comment:  a.) TTE 08/17/2016: EF >55%, mild AS (MPG 29.5); b.) TTE  08/25/2018: EF >55%; mod AS (MPG 42.3); c.) TTE               06/17/2020: EF >55%; severe AS (MPG 47.2); d.) s/p TAVR               08/31/2020 at [facility] - 23 mm Edwards Sapien 3 Ultra valve                placed via a RIGHT percutaneous femoral approach -->               complicated by intra/postoperative CHB requiring TVP and               ultimate PPM placement No date: Arthritis No date: Basal cell carcinoma (BCC) of back No date: Bilateral leg edema 2011: Breast cancer, right (HCC)     Comment:  a.) stage Ia IMC (ER/PR +, HER2/neu -); b.) lumpectomy +              XRT in 2011; c.) completed 5 years AI therapy               (anastrozole) in 11/2014 No date: Cervical spinal stenosis No date: Cervical spondylosis No date: Chemotherapy induced nausea and vomiting 08/31/2020: Complete heart block (HCC)     Comment:  a.) TVP placed intraoperatively during TAVR secondary to              development of CHB; b.) Medtronic PPM placed 09/03/2020               for postoperative continued postoperative CHB No date: Coronary artery disease     Comment:  a.) RHC 08/11/2020: no sig CAD No date: DDD (degenerative disc disease), cervical No date: Diastolic dysfunction     Comment:  a.) TTE 08/17/2016: EF >55%, mild LVH, mild LAE, triv               panvalvular regurg, G1DD; b.) TTE 08/25/2018: EF >55%,               mod LVH, mild LAE, triv AR/TR/PR, mild MR, G1dd; c.) TTE               08/11/2020: EF >55%, mild LVHH, mod LAE, triv MR/TR 03/2021: Guillain Barr syndrome (HCC) No date: Heart murmur No date: History of bilateral cataract extraction No date: Hyperplastic colon polyp No date: Hypertension No date: Hyperthyroidism No date: Neuropathy No date: Osteopenia No date: PONV (postoperative nausea and vomiting) 09/03/2020: Presence of permanent cardiac pacemaker     Comment:  a.) TVP placed intraoperatively during TAVR secondary to              development of CHB; b.) Medtronic PPM placed 09/03/2020               for postoperative continued postoperative CHB 08/31/2020: S/P TAVR (transcatheter aortic valve replacement)     Comment:  a.) s/p TAVR 08/31/2020 at [facility] - 23 mm Edwards  Sapien 3              Ultra valve placed via a RIGHT percutaneous femoral               approach --> complicated by intra/postoperative CHB               requiring TVP and ultimate PPM placement 01/25/2022: Thyroid nodule     Comment:  a.) CT C-spine 01/25/2022: 22 mm right thyroid nodule  Past Surgical History: No date: BACK SURGERY 03/2009: BREAST  EXCISIONAL BIOPSY; Right No date: BREAST EXCISIONAL BIOPSY; Bilateral     Comment:  NEG No date: CATARACT EXTRACTION W/ INTRAOCULAR LENS  IMPLANT, BILATERAL No date: CHOLECYSTECTOMY 10/19/2019: COLONOSCOPY WITH PROPOFOL; N/A     Comment:  Procedure: COLONOSCOPY WITH PROPOFOL-attempted, unable               to perform poor prep;  Surgeon: Midge Minium, MD;                Location: [facility];  Service: Endoscopy;                Laterality: N/A;  priority 3 10/20/2019: COLONOSCOPY WITH PROPOFOL; N/A     Comment:  Procedure: COLONOSCOPY WITH PROPOFOL;  Surgeon: Midge Minium, MD;  Location: [facility];  Service:               Endoscopy;  Laterality: N/A; 11/16/2021: COLONOSCOPY WITH PROPOFOL; N/A     Comment:  Procedure: COLONOSCOPY WITH PROPOFOL;  Surgeon: Midge Minium, MD;  Location: [facility] END[facility];  Service:               Endoscopy;  Laterality: N/A; 10/19/2019: ESOPHAGEAL DILATION     Comment:  Procedure: ESOPHAGEAL DILATION;  Surgeon: Midge Minium,               MD;  Location: [facility];  Service: Endoscopy;; 10/19/2019: ESOPHAGOGASTRODUODENOSCOPY (EGD) WITH PROPOFOL; N/A     Comment:  Procedure: ESOPHAGOGASTRODUODENOSCOPY (EGD) WITH               PROPOFOL;  Surgeon: Midge Minium, MD;  Location: [facility];  Service: Endoscopy;  Laterality: N/A; 03/20/2022: FLEXIBLE SIGMOIDOSCOPY; N/A     Comment:  Procedure: FLEXIBLE SIGMOIDOSCOPY;  Surgeon: Midge Minium, MD;  Location: [facility] END[facility];  Service:               Endoscopy;  Laterality: N/A; No  date: HEMORRHOID SURGERY 2010: MASTECTOMY, PARTIAL; Right 09/03/2020: PACEMAKER INSERTION; N/A     Comment:  Procedure: PACEMAKER WITH LEFT BUNDLE LEAD (MEDTRONIC);               Location: [facility]; Surgeon: Constance Haw, MD 2021[place] REMOVAL 11/23/2019: PORTACATH PLACEMENT; Right     Comment:  Procedure: INSERTION PORT-A-CATH;  Surgeon: Campbell Lerner, MD;  Location: [facility] OR[facility];  Service: General;                Laterality: Right; 10/2019: RIGHT COLECTOMY; Right     Comment:  Procedure: ROBOTIC ASSISTED TIGHT HEMICOLECTOMY 08/11/2020: RIGHT/LEFT HEART CATH AND CORONARY ANGIOGRAPHY; Right     Comment:  Procedure: RIGHT/LEFT HEART CATH AND CORONARY               ANGIOGRAPHY; Location: [facility]; Surgeon: Manson Allan, MD 08/31/2020: TRANSCATHETER AORTIC VALVE REPLACEMENT, TRANSFEMORAL;  Right     Comment:  Procedure: TRANSCATHETER AORTIC VALVE REPLACEMENT, RIGHT              TRANSFEMORAL; Location: [facility]; Surgeon: Clent Jacks, MD 04/11/2022: TUMOR EXCISION; N/A     Comment:  Procedure: TUMOR EXCISION RECTAL;  Surgeon: Claudine Mouton,  Katherina Right, MD;  Location: [facility] OR[facility];  Service: General;                Laterality: N/A;  BMI    Body Mass Index: 34.67 kg/m      Reproductive/Obstetrics negative OB ROS                             Anesthesia Physical Anesthesia Plan  ASA: 3  Anesthesia Plan: General ETT and General   Post-op Pain Management: Regional block*   Induction: Intravenous  PONV Risk Score and Plan: 3 and TIVA, Midazolam, Propofol infusion, Ondansetron and Dexamethasone  Airway Management Planned: Oral ETT  Additional Equipment:   Intra-op Plan:   Post-operative Plan: Extubation in OR  Informed Consent: I have reviewed the patients History and Physical, chart, labs and discussed the procedure including the risks, benefits and alternatives for the proposed anesthesia with the patient or authorized representative who has  indicated his/her understanding and acceptance.     Dental Advisory Given  Plan Discussed with: Anesthesiologist, CRNA and Surgeon  Anesthesia Plan Comments: (Patient consented for risks of anesthesia including but not limited to:  - adverse reactions to medications - damage to eyes, teeth, lips or other oral mucosa - nerve damage due to positioning  - sore throat or hoarseness - Damage to heart, brain, nerves, lungs, other parts of body or loss of life  Patient voiced understanding.)       Anesthesia Quick Evaluation

## 2023-02-12 NOTE — Progress Notes (Signed)
Progress Note   Patient: Maria Davies ZOX:[id number] DOB: 1940/08/01 DOA: 02/07/2023     3 DOS: the patient was seen and examined on 02/12/2023   Brief hospital course: HPI on admission 02/07/23: "Maria Davies is a 82 y.o. 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     Pt states that she fell accidentally when she was walking to the bathroom at a large time.  She injured her right shoulder and right lower leg, causing a larege bruises in the right leg shin area.  Patient is strongly denies head or neck injury.  She does not want to CT scan of head and neck at this moment.  No loss of consciousness.  Her right shoulder pain is constant, sharp, severe, radiating to the right side of neck, aggravated by movement. ..."  ED evaluation notable for: Stable vitals Labs with K 2.3, Na 132, Cl 92, glucose 177 CBC no leukocytosis, normal Hbg and platelets  Right shoulder x-ray and CT scan showed acute comminuted intra-articular right humeral head and neck fracture with humeral head displaced anteriorly and impacted on the anterior glenoid. Hematoma of rotator cuff muscles at humeral head insertion.  Pt was admitted to the hospital with Orthopedic Surgery consulted.  Cardiology also consulted for pre-operative evaluation and risk assessment.  Further hospital course and management as outlined below.   Assessment and Plan:  * Closed fracture of right shoulder Mgmt per Orthopedic Surgery Cardiology cleared for surgery. To OR for reverse shoulder arthroplasty today with Dr. Joice Lofts Activity restrictions per Ortho post-op - see their recommendations RUE in immobilizing sling  post-op Pain control PRN Bowel regimen PT/OT evals tomorrow  Hyponatremia Resolved after IV hydration. Initially felt hypervolemic given BLE edema, subjective dyspnea on exertion. Na improved to 135 with IV fluids. --Hold HCTZ --Monitoring off IV fluids --Encourage PO hydration --Monitor BMP   Hypokalemia Resolved with replacement. K 2.3 on admission was replaced. Mg 1.8 8/27 - K 3.4 - replaced Monitor BMP  Acute on chronic diastolic CHF (congestive heart failure) (HCC) Echo this admission EF   60-65%, grade I diastolic dysfunction, mild LVH, mild MR Pt has some mild BLE edema, mild DOE reported at home, BNP normal 63.8 Initially appeared mildly hypervolemic. --Given IV Lasix 20 mg BID x 3 doses --Stopped Lasix with sodium decline --LE edema improved --Daily weights and I/O's to monitor volume status --Continue Toprol-XL   Fall at home, initial encounter Resulted in R shoulder fracture. Has right leg bruising as well. 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 Started on CIWA to monitor for any withdrawal symptoms.   --No symptoms.  Stopped CIWA   Obesity (BMI 30-39.9) Body mass index is 34.99 kg/m. Complicates overall care and prognosis.  Recommend lifestyle modifications including physical activity and diet for weight loss and overall long-term health.  Aortic atherosclerosis (HCC) Continue ASA  Hyperthyroidism --Methimazole   Hypertension --IV hydralazine as needed --Amlodipine, Lotensin, metoprolol  Aortic stenosis, mild S/p TAVR in 08/2020  Closed fracture dislocation of right shoulder .        Subjective: Pt seen after surgery this afternoon, daughter  at bedside. Pt reports feeling okay.  No acute complaints.     Physical Exam: Vitals:   02/12/23 1400 02/12/23 1415 02/12/23 1430 02/12/23 1455  BP: 124/77 101/67  116/75 117/73  Pulse: (!) 110 (!) 107 100 (!) 109  Resp: 16 (!) 27 18 16   Temp:   97.9 F (36.6 C) (!) 97.5 F (36.4 C)  TempSrc:      SpO2: 94% 93% 93% 96%  Weight:      Height:       General exam: awake, appears drowsy, no acute distress Respiratory system: lungs clear no wheezes or rhonchi, on room air, normal respiratory effort. Cardiovascular system: RRR, stable to improved LE edema   Gastrointestinal system: soft, NT, ND Central nervous system: A&O x3. no gross focal neurologic deficits, normal speech Extremities: RUE in immobilizing sling, stable mild BLE nonpitting edema, normal tone Skin: dry, intact, normal temperature Psychiatry: normal mood, congruent affect, judgement and insight appear normal    Data Reviewed:  Notable labs ---  Na 128 >> 130 >> 135 with IV fluids >> 135 glucose 107, Cr 0.38, Ca 8.8   Family Communication: daughter at bedside on rounds today  Disposition: Status is: Inpatient Remains inpatient appropriate because: Surgery today, PT/OT evaluations tomorrow.  Likely to require SNF/rehab placement.    Planned Discharge Destination: Skilled nursing facility    Time spent: 36 minutes  Author: Pennie Banter, DO 02/12/2023 3:22 PM  For on call review [email or web address].

## 2023-02-13 DIAGNOSIS — Z789 Other specified health status: Secondary | ICD-10-CM

## 2023-02-13 DIAGNOSIS — E876 Hypokalemia: Secondary | ICD-10-CM | POA: Diagnosis not present

## 2023-02-13 DIAGNOSIS — S4291XA Fracture of right shoulder girdle, part unspecified, initial encounter for closed fracture: Secondary | ICD-10-CM | POA: Diagnosis not present

## 2023-02-13 DIAGNOSIS — W19XXXA Unspecified fall, initial encounter: Secondary | ICD-10-CM | POA: Diagnosis not present

## 2023-02-13 LAB — CBC
HCT: 28.2 % — ABNORMAL LOW (ref 36.0–46.0)
Hemoglobin: 10.1 g/dL — ABNORMAL LOW (ref 12.0–15.0)
MCH: 36.7 pg — ABNORMAL HIGH (ref 26.0–34.0)
MCHC: 35.8 g/dL (ref 30.0–36.0)
MCV: 102.5 fL — ABNORMAL HIGH (ref 80.0–100.0)
Platelets: 178 10*3/uL (ref 150–400)
RBC: 2.75 MIL/uL — ABNORMAL LOW (ref 3.87–5.11)
RDW: 13.3 % (ref 11.5–15.5)
WBC: 9.6 10*3/uL (ref 4.0–10.5)
nRBC: 0 % (ref 0.0–0.2)

## 2023-02-13 LAB — BASIC METABOLIC PANEL
Anion gap: 8 (ref 5–15)
BUN: 11 mg/dL (ref 8–23)
CO2: 28 mmol/L (ref 22–32)
Calcium: 8.3 mg/dL — ABNORMAL LOW (ref 8.9–10.3)
Chloride: 97 mmol/L — ABNORMAL LOW (ref 98–111)
Creatinine, Ser: 0.47 mg/dL (ref 0.44–1.00)
GFR, Estimated: >60 mL/min (ref >60)
Glucose, Bld: 109 mg/dL — ABNORMAL HIGH (ref 70–99)
Potassium: 3.7 mmol/L (ref 3.5–5.1)
Sodium: 133 mmol/L — ABNORMAL LOW (ref 135–145)

## 2023-02-13 MED ORDER — TRAZODONE HCL 50 MG PO TABS
25.0000 mg | ORAL_TABLET | Freq: Every evening | ORAL | Status: DC | PRN
  Administered 2023-02-13: 25 mg via ORAL
  Filled 2023-02-13: qty 1

## 2023-02-13 NOTE — Progress Notes (Signed)
Progress Note   Patient: Maria Davies:[id number] DOB: [birth year] DOA: 02/07/2023     4 DOS: the patient was seen and examined on 02/13/2023   Brief hospital course: HPI on admission 02/07/23: "Maria Davies is a 82 y.o. 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     Pt states that she fell accidentally when she was walking to the bathroom at a large time.  She injured her right shoulder and right lower leg, causing a larege bruises in the right leg shin area.  Patient is strongly denies head or neck injury.  She does not want to CT scan of head and neck at this moment.  No loss of consciousness.  Her right shoulder pain is constant, sharp, severe, radiating to the right side of neck, aggravated by movement. ..."  ED evaluation notable for: Stable vitals Labs with K 2.3, Na 132, Cl 92, glucose 177 CBC no leukocytosis, normal Hbg and platelets  Right shoulder x-ray and CT scan showed acute comminuted intra-articular right humeral head and neck fracture with humeral head displaced anteriorly and impacted on the anterior glenoid. Hematoma of rotator cuff muscles at humeral head insertion.  Pt was admitted to the hospital with Orthopedic Surgery consulted.  Cardiology also consulted for pre-operative evaluation and risk assessment.   8/27:Reverse right total shoulder arthroplasty.  8/28: PT, OT eval, patient prefers [facility] at discharge for SNF.  TOC aware and working on placement  Assessment and Plan:  * Closed fracture of right shoulder Status post Reverse right total shoulder arthroplasty.  Postop day 1 Activity restrictions per Ortho post-op - see their  recommendations RUE in immobilizing sling post-op Pain control PRN Bowel regimen PT/OT evals-likely SNF at discharge  Hyponatremia Initially felt hypervolemic given BLE edema, subjective dyspnea on exertion. Na improved to 133 with IV fluids. --Hold HCTZ --Monitoring off IV fluids --Encourage PO hydration --Monitor BMP   Hypokalemia Resolved with replacement.  Acute on chronic diastolic CHF (congestive heart failure) (HCC) Echo this admission EF   60-65%, grade I diastolic dysfunction, mild LVH, mild MR Pt has some mild BLE edema, mild DOE reported at home, BNP normal 63.8 Initially appeared mildly hypervolemic. --Given IV Lasix 20 mg BID x 3 doses --Stopped Lasix with sodium decline --LE edema improving --Daily weights and I/O's to monitor volume status --Continue Toprol-XL  Fall at home, initial encounter Resulted in R shoulder fracture. Has right leg bruising as well. 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 Started on CIWA to monitor for any withdrawal symptoms.   --No symptoms.  Stopped CIWA   Obesity (BMI 30-39.9) Body mass index is 34.99 kg/m. Complicates overall care and prognosis.  Recommend lifestyle modifications including physical activity and diet for weight loss and overall long-term health.  Aortic atherosclerosis (HCC) Continue ASA  Hyperthyroidism --Methimazole   Hypertension --IV hydralazine as needed --Amlodipine, Lotensin, metoprolol  Aortic stenosis, mild S/p TAVR in 08/2020         Subjective: No new issues.  Appreciative of her care   Physical Exam: Vitals:   02/13/23 0500 02/13/23 0528 02/13/23 0830 02/13/23 1722  BP:  (!) 140/76 126/61 109/61  Pulse:  79 81 87  Resp:  17 18 18   Temp:  98.2 F (36.8 C) 97.7 F (36.5 C) 97.6 F (36.4 C)  TempSrc:      SpO2:  96% 100% 98%  Weight: 92.9 kg      Height:       General exam: awake, appears drowsy, no acute distress Respiratory system: lungs clear no wheezes or rhonchi, on room air, normal respiratory effort. Cardiovascular system: RRR, stable to improved LE edema   Gastrointestinal system: soft, NT, ND Central nervous system: A&O x3. no gross focal neurologic deficits, normal speech Extremities: RUE in immobilizing sling, stable mild BLE nonpitting edema, normal tone Skin: dry, intact, normal temperature Psychiatry: normal mood, congruent affect, judgement and insight appear normal    Data Reviewed:  Notable labs ---  Na 128 >> 130 >> 133   Family Communication: None at bedside  Disposition: Status is: Inpatient Remains inpatient appropriate because:  PT/OT evaluations pending.  Likely to require SNF/rehab placement.    Planned Discharge Destination: Skilled nursing facility    Time spent: 35 minutes  Author: Delfino Lovett, MD 02/13/2023 8:38 PM  For on call review [email or web address].

## 2023-02-13 NOTE — Evaluation (Signed)
Occupational Therapy Evaluation Patient Details Name: Maria Davies MRN: [id number] DOB: 1941-02-05 Today's Date: 02/13/2023   History of Present Illness Berlyn Wesoloski is an 82yoF who comes to [facility] ED on 8/22 after mechanical fall at home, imaging shows Rt shoulder fracture, taken to OR with Leron Croak for rTSA 02/12/23. Pt lives alone at baseline, community AMB without device, history of neuropathy and prior fall with Left humerus injury.   Clinical Impression   Patient presenting with decreased Ind in self care,balance, functional mobility/transfers, endurance, and safety awareness. Patient reports living at home alone and independently at baseline. She does report a daughter and son in law that and provide intermittent assist but both work during the day. OT reviewed precautions and shoulder sling use. Pt needing min A for bed mobility and stands with min HHA. Pt stands at sink for grooming tasks with CGA and only ambulates  30' with encouragement. Pt becomes very anxious and requesting to return to bed.  Patient will benefit from acute OT to increase overall independence in the areas of ADLs, functional mobility, and safety awareness in order to safely discharge.      If plan is discharge home, recommend the following: A little help with walking and/or transfers;A lot of help with bathing/dressing/bathroom;Assistance with cooking/housework;Assist for transportation;Help with stairs or ramp for entrance    Functional Status Assessment  Patient has had a recent decline in their functional status and demonstrates the ability to make significant improvements in function in a reasonable and predictable amount of time.  Equipment Recommendations  Other (comment) (defer to next venue of care)       Precautions / Restrictions Precautions Precautions: Fall;Shoulder Shoulder Interventions: Shoulder sling/immobilizer;Shoulder abduction pillow;Don joy ultra sling;Off for  dressing/bathing/exercises;At all times Restrictions Weight Bearing Restrictions: Yes RUE Weight Bearing: Non weight bearing      Mobility Bed Mobility Overal bed mobility: Needs Assistance Bed Mobility: Sit to Supine, Supine to Sit     Supine to sit: Min assist Sit to supine: Min assist        Transfers Overall transfer level: Needs assistance   Transfers: Sit to/from Stand Sit to Stand: Min assist, Contact guard assist                  Balance Overall balance assessment: Needs assistance Sitting-balance support: Feet supported Sitting balance-Leahy Scale: Good     Standing balance support: During functional activity, Single extremity supported Standing balance-Leahy Scale: Fair                             ADL either performed or assessed with clinical judgement   ADL Overall ADL's : Needs assistance/impaired     Grooming: Wash/dry hands;Oral care;Standing;Minimal assistance                   Toilet Transfer: Minimal assistance                   Vision Patient Visual Report: No change from baseline              Pertinent Vitals/Pain Pain Assessment Pain Assessment: No/denies pain     Extremity/Trunk Assessment Upper Extremity Assessment Upper Extremity Assessment: Right hand dominant;RUE deficits/detail RUE Deficits / Details: surgery and NWB RUE: Unable to fully assess due to immobilization;Unable to fully assess due to pain           Communication Communication Communication: No apparent difficulties   Cognition  Arousal: Alert Behavior During Therapy: Anxious Overall Cognitive Status: Within Functional Limits for tasks assessed                                                  Home Living Family/patient expects to be discharged to:: Private residence Living Arrangements: Alone Available Help at Discharge: Family;Available PRN/intermittently Type of Home: House Home Access: Level  entry Entrance Stairs-Number of Steps: Pt has 1 step inside of home to kitchen which is where she tripped and fell   Home Layout: One level     Bathroom Shower/Tub: Chief Strategy Officer: Standard     Home Equipment: None          Prior Functioning/Environment Prior Level of Function : Driving;History of Falls (last six months)             Mobility Comments: community AMB ADLs Comments: independent        OT Problem List: Decreased strength;Pain;Decreased activity tolerance;Decreased safety awareness;Impaired balance (sitting and/or standing);Decreased knowledge of use of DME or AE;Impaired UE functional use      OT Treatment/Interventions: Self-care/ADL training;Therapeutic exercise;Therapeutic activities;Balance training;Manual therapy;Patient/family education    OT Goals(Current goals can be found in the care plan section) Acute Rehab OT Goals Patient Stated Goal: to get stronger and go to rehab OT Goal Formulation: With patient Time For Goal Achievement: 02/27/23 Potential to Achieve Goals: Fair ADL Goals Pt Will Perform Grooming: with modified independence;standing Pt Will Perform Lower Body Dressing: with supervision;sit to/from stand Pt Will Transfer to Toilet: with supervision;ambulating Pt Will Perform Toileting - Clothing Manipulation and hygiene: with supervision;sit to/from stand  OT Frequency: Min 1X/week       AM-PAC OT "6 Clicks" Daily Activity     Outcome Measure Help from another person eating meals?: A Little Help from another person taking care of personal grooming?: A Little Help from another person toileting, which includes using toliet, bedpan, or urinal?: A Lot Help from another person bathing (including washing, rinsing, drying)?: A Lot Help from another person to put on and taking off regular upper body clothing?: A Lot Help from another person to put on and taking off regular lower body clothing?: A Lot 6 Click Score: 14    End of Session Nurse Communication: Mobility status  Activity Tolerance: Patient limited by fatigue;Other (comment) (very anxious) Patient left: in bed;with call bell/phone within reach;with bed alarm set  OT Visit Diagnosis: Unsteadiness on feet (R26.81);Muscle weakness (generalized) (M62.81);Repeated falls (R29.6)                Time: 1610-9604 OT Time Calculation (min): 27 min Charges:  OT General Charges $OT Visit: 1 Visit OT Evaluation $OT Eval Moderate Complexity: 1 Mod OT Treatments $Self Care/Home Management : 8-22 mins  Jackquline Denmark, MS, OTR/L , CBIS ascom [phone]  02/13/23, 2:54 PM

## 2023-02-13 NOTE — TOC Progression Note (Signed)
Transition of Care [facility]) - Progression Note    Patient Details  Name: Maria Davies MRN: [id number] Date of Birth: 1940-07-12  Transition of [facility]) CM/SW Contact  Marlowe Sax, RN Phone Number: 02/13/2023, 1:39 PM  Clinical Narrative:    Spoke with the patient She lives alone and would benefit from going to [facility] SN[facility], I let her know that I have to see if they are able to accept her.  She is agreeable   Expected Discharge Plan: Skilled Nursing Facility Barriers to Discharge: Continued Medical Work up  Expected Discharge Plan and Services       Living arrangements for the past 2 months: Single Family Home                                       Social Determinants of Health (SDOH) Interventions SDOH Screenings   Food Insecurity: No Food Insecurity (02/08/2023)  Housing: Low Risk  (02/08/2023)  Transportation Needs: No Transportation Needs (02/08/2023)  Utilities: Not At Risk (02/08/2023)  Alcohol Screen: Low Risk  (09/10/2022)  Depression (PHQ2-9): Low Risk  (11/05/2022)  Financial Resource Strain: Low Risk  (09/10/2022)  Physical Activity: Insufficiently Active (09/10/2022)  Social Connections: Moderately Isolated (09/10/2022)  Stress: No Stress Concern Present (09/10/2022)  Tobacco Use: Low Risk  (02/07/2023)    Readmission Risk Interventions    02/08/2023    1:10 PM  Readmission Risk Prevention Plan  Transportation Screening Complete  PCP or Specialist Appt within 5-7 Days Complete  Home Care Screening Complete  Medication Review (RN CM) Complete

## 2023-02-13 NOTE — Evaluation (Signed)
Physical Therapy Evaluation Patient Details Name: Maria Davies MRN: [id number] DOB: February 20, 1941 Today's Date: 02/13/2023  History of Present Illness  Maria Davies is an 82yoF who comes [facility] ED on 8/22 after mechanical fall at home, imaging shows Rt shoulder fracture, taken to OR with Leron Croak for rTSA 02/12/23. Pt lives alone at baseline, community AMB without device, history of neuropathy and prior fall with Left humerus injury.  Clinical Impression  Pt in bed on entry, slide donned, polar care donned, no pain (block still largely in effect). Pt shows difficulty with use of single non-dominant arm during session, able to perform bed mobility with great effort but requires bedfeatures in place, minGuardA transfers, and min guard to minA for AMB due to appearance of anterior shufflifn out of control when AMB, seems somewhat impulsive, kicking quads cane left and walking her shoulder into door 3x on Right. Pt presyncopal three times in session with standing, pressures show orthostatic hypotension. Will continue to follow.   Orthostatic VS for the past 24 hrs:  BP- Sitting Pulse- Sitting BP- Standing at 0 minutes Pulse- Standing at 0 minutes  02/13/23 1203 139/69 79 95/54 78          If plan is discharge home, recommend the following: A lot of help with walking and/or transfers;A lot of help with bathing/dressing/bathroom;Assist for transportation;Assistance with cooking/housework   Can travel by private vehicle   No    Equipment Recommendations None recommended by PT  Recommendations for Other Services       Functional Status Assessment       Precautions / Restrictions Precautions Precautions: Fall Restrictions Weight Bearing Restrictions: Yes RUE Weight Bearing: Non weight bearing      Mobility  Bed Mobility Overal bed mobility: Needs Assistance           Sit to sidelying:  (recliner long sitting to EOB short sitting)      Transfers Overall transfer level:  Needs assistance   Transfers: Sit to/from Stand Sit to Stand: Contact guard assist                Ambulation/Gait Ambulation/Gait assistance: Contact guard assist, Min assist Gait Distance (Feet): 18 Feet Assistive device: Quad cane         General Gait Details: walks Rt armc into door twice on the way in and doorframe 1x on th eway out. Presyncopal dyspnea adn spaghetti legs while up, later noted orthostatic BP.  Stairs            Wheelchair Mobility     Tilt Bed    Modified Rankin (Stroke Patients Only)       Balance                                             Pertinent Vitals/Pain      Home Living Family/patient expects to be discharged to:: Private residence Living Arrangements: Alone Available Help at Discharge: Family (DTR can assist, lives close, makes schedule)                    Prior Function Prior Level of Function : Driving;History of Falls (last six months)             Mobility Comments: community AMB ADLs Comments: independent     Extremity/Trunk Assessment  Communication      Cognition                                                General Comments      Exercises     Assessment/Plan    PT Assessment Patient needs continued PT services  PT Problem List Decreased strength;Decreased range of motion;Decreased activity tolerance;Decreased balance;Decreased mobility;Decreased cognition       PT Treatment Interventions DME instruction;Stair training;Functional mobility training;Therapeutic exercise;Patient/family education    PT Goals (Current goals can be found in the Care Plan section)  Acute Rehab PT Goals Patient Stated Goal: improve balance, avoid fall PT Goal Formulation: With patient Time For Goal Achievement: 02/27/23 Potential to Achieve Goals: Fair    Frequency 7X/week     Co-evaluation               AM-PAC PT "6 Clicks" Mobility   Outcome Measure Help needed turning from your back to your side while in a flat bed without using bedrails?: A Lot Help needed moving from lying on your back to sitting on the side of a flat bed without using bedrails?: A Lot Help needed moving to and from a bed to a chair (including a wheelchair)?: A Little Help needed standing up from a chair using your arms (e.g., wheelchair or bedside chair)?: A Little Help needed to walk in hospital room?: A Little Help needed climbing 3-5 steps with a railing? : A Little 6 Click Score: 16    End of Session   Activity Tolerance: Patient tolerated treatment well;No increased pain Patient left: in bed;with call bell/phone within reach Nurse Communication: Mobility status PT Visit Diagnosis: Other abnormalities of gait and mobility (R26.89);Difficulty in walking, not elsewhere classified (R26.2);Muscle weakness (generalized) (M62.81)    Time: 7829-5621 PT Time Calculation (min) (ACUTE ONLY): 23 min   Charges:   PT Evaluation $PT Eval Moderate Complexity: 1 Mod PT Treatments $Therapeutic Activity: 8-22 mins PT General Charges $$ ACUTE PT VISIT: 1 Visit        1:07 PM, 02/13/23 Rosamaria Lints, PT, DPT Physical Therapist - [facility]  [phone] (ASCOM)    Aadith Raudenbush C 02/13/2023, 1:04 PM

## 2023-02-13 NOTE — Progress Notes (Signed)
Subjective: 1 Day Post-Op Procedure(s) (LRB): REVERSE SHOULDER ARTHROPLASTY (Right) Patient reports pain as mild.   Patient is well, and has had no acute complaints or problems PT and care management to assist with discharge planning. Negative for chest pain and shortness of breath Fever: no Gastrointestinal:Negative for nausea and vomiting Reports that she is passing gas this morning.  Objective: Vital signs in last 24 hours: Temp:  [97 F (36.1 C)-98.2 F (36.8 C)] 98.2 F (36.8 C) (08/28 0528) Pulse Rate:  [79-113] 79 (08/28 0528) Resp:  [13-27] 17 (08/28 0528) BP: (82-152)/(46-89) 140/76 (08/28 0528) SpO2:  [93 %-100 %] 96 % (08/28 0528) Weight:  [92.9 kg] 92.9 kg (08/28 0500)  Intake/Output from previous day:  Intake/Output Summary (Last 24 hours) at 02/13/2023 0741 Last data filed at 02/13/2023 0404 Gross per 24 hour  Intake 1000 ml  Output 250 ml  Net 750 ml    Intake/Output this shift: No intake/output data recorded.  Labs: Recent Labs    02/13/23 0347  HGB 10.1*   Recent Labs    02/13/23 0347  WBC 9.6  RBC 2.75*  HCT 28.2*  PLT 178   Recent Labs    02/12/23 0408 02/13/23 0347  NA 135 133*  K 3.4* 3.7  CL 99 97*  CO2 29 28  BUN 6* 11  CREATININE 0.38* 0.47  GLUCOSE 107* 109*  CALCIUM 8.8* 8.3*   No results for input(s): "LABPT", "INR" in the last 72 hours.   EXAM General - Patient is Alert, Appropriate, and Oriented Extremity - ABD soft Sling intact to the right arm. Decreased sensation to the right arm from previous right interscalene block. Dressing/Incision - clean, dry, no drainage noted to the right knee honeycomb dressing. Motor Function - intact, moving foot and toes well on exam. Moving fingers this morning without pain. Abdomen soft with intact bowel sounds.  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   a.) stage IIIb; b.) RIGHT colon mass Bx (+) for invasive moderately differentiated  adenocarcinoma (pT3 pN1a)   Anemia    Aortic atherosclerosis (HCC)    Aortic stenosis    a.) TTE 08/17/2016: EF >55%, mild AS (MPG 29.5); b.) TTE 08/25/2018: EF >55%; mod AS (MPG 42.3); c.) TTE 06/17/2020: EF >55%; severe AS (MPG 47.2); d.) s/p TAVR 08/31/2020 at [facility] - 23 mm Edwards Sapien 3 Ultra valve placed via a RIGHT percutaneous femoral approach --> complicated by intra/postoperative CHB requiring TVP and ultimate PPM placement   Arthritis    Basal cell carcinoma (BCC) of back    Bilateral leg edema    Breast cancer, right (HCC) 2011   a.) stage Ia IMC (ER/PR +, HER2/neu -); b.) lumpectomy + XRT in 2011; c.) completed 5 years AI therapy (anastrozole) in 11/2014   Cervical spinal stenosis    Cervical spondylosis    Chemotherapy induced nausea and vomiting    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   a.) TVP placed intraoperatively during TAVR secondary to development of CHB; b.) Medtronic PPM placed 09/03/2020 for postoperative continued postoperative CHB   Coronary artery disease    a.) RHC 08/11/2020: no sig CAD   DDD (degenerative disc disease), cervical    Diastolic dysfunction    a.) TTE 08/17/2016: EF >55%, mild LVH, mild LAE, triv panvalvular regurg, G1DD; b.) TTE 08/25/2018: EF >55%, mod LVH, mild LAE, triv AR/TR/PR, mild MR, G1dd; c.) TTE 08/11/2020: EF >55%, mild LVHH, mod LAE, triv MR/TR   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   Heart murmur    History of bilateral cataract extraction    Hyperplastic colon polyp    Hypertension    Hyperthyroidism    Neuropathy    Osteopenia    PONV (postoperative nausea and vomiting)    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   a.) TVP placed intraoperatively during TAVR secondary to development of CHB; b.) Medtronic PPM placed 09/03/2020 for postoperative continued postoperative CHB   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   a.) s/p TAVR 08/31/2020 at [facility] - 23 mm Edwards Sapien 3 Ultra valve placed via  a RIGHT percutaneous femoral approach --> complicated by intra/postoperative CHB requiring TVP and ultimate PPM placement   Thyroid nodule 01/25/2022   a.) CT C-spine 01/25/2022: 22 mm right thyroid nodule    Assessment/Plan: 1 Day Post-Op Procedure(s) (LRB): REVERSE SHOULDER ARTHROPLASTY (Right) Principal Problem:   Closed fracture of right shoulder Active Problems:   Aortic stenosis, mild   Hypertension   Hyperthyroidism   Aortic atherosclerosis (HCC)   Complete heart block (HCC)   Fall at home, initial encounter   Acute on chronic diastolic CHF (congestive heart failure) (HCC)   Hypokalemia   Obesity (BMI 30-39.9)   Alcohol use   Hyponatremia   Closed fracture dislocation of right shoulder  Estimated body mass index is 36.28 kg/m as calculated from the following:   Height as of this encounter: 5\' 3"  (1.6 m).   Weight as of this encounter: 92.9 kg. Advance diet Up with therapy D/C IV fluids when tolerating po intake.  Labs reviewed this AM, WBC 9.6, Hg 10.1 this morning. Up with therapy today.  She lives at home by herself.  She will likely need SNF. Continue to work on BM. Care management to assist with discharge.  DVT Prophylaxis - Lovenox and TED hose Non-weightbearing to the right arm.  Valeria Batman, PA-C [facility] 02/13/2023, 7:41 AM

## 2023-02-13 NOTE — Plan of Care (Signed)

## 2023-02-13 NOTE — [place] FL2 (Signed)
[place] MEDICAID FL2 LEVEL OF CARE FORM     IDENTIFICATION  Patient Name: Maria Davies Birthdate: 28-Feb-1941 Sex: female Admission Date (Current Location): 02/07/2023  [facility] and [place] Number:  Chiropodist and Address:  [facility], [address], [place], [place] [address]      Provider Number: [id number]  Attending Physician Name and Address:  Delfino Lovett, MD  Relative Name and Phone Number:  Dirk Dress [phone]    Current Level of Care: Hospital Recommended Level of Care: Skilled Nursing Facility Prior Approval Number:    Date Approved/Denied:   PASRR Number: [id number]  Discharge Plan: SNF    Current Diagnoses: Patient Active Problem List   Diagnosis Date Noted   Closed fracture dislocation of right shoulder 02/09/2023   Hyponatremia 02/08/2023   Closed fracture of right proximal humerus 02/07/2023   Closed fracture of right shoulder 02/07/2023   Fall at home, initial encounter 02/07/2023   Acute on chronic diastolic CHF (congestive heart failure) (HCC) 02/07/2023   Hypokalemia 02/07/2023   Obesity (BMI 30-39.9) 02/07/2023   Alcohol use 02/07/2023   History of colonic polyps    Rectal polyp    Personal history of colon cancer    Polyp of descending colon    Closed fracture of proximal end of left humerus 09/18/2021   Guillain Barr syndrome (HCC) 05/08/2021   Lumbar arthropathy 04/10/2021   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   Bilateral leg weakness 03/31/2021   Presence of permanent cardiac pacemaker 12/13/2020   Aortic atherosclerosis (HCC) 09/07/2020   Postoperative anemia 09/01/2020   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   Post-operative pain 08/31/2020   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   Personal history of other malignant neoplasm of large intestine 06/18/2020   Presence of cardiac pacemaker 06/18/2020   Goals of care, counseling/discussion 11/19/2019    Status post laparoscopic colectomy 11/17/2019   Colon cancer (HCC) 10/28/2019   Iron deficiency anemia secondary to blood loss (chronic)    Polyp of sigmoid colon    Benign neoplasm of cecum    Intestines neoplasm    Stricture and stenosis of esophagus    Advanced care planning/counseling discussion 07/22/2017   Hypercholesteremia 07/03/2016   Osteoporosis 07/03/2016   Carcinoma of right breast (HCC) 06/19/2016   Aortic stenosis, mild 05/02/2015   Hypertension 05/02/2015   Hyperthyroidism 05/02/2015   Breast cancer in female [facility]) 05/02/2015    Orientation RESPIRATION BLADDER Height & Weight     Self, Time, Situation, Place  Normal Continent, External catheter Weight: 92.9 kg Height:  5\' 3"  (160 cm)  BEHAVIORAL SYMPTOMS/MOOD NEUROLOGICAL BOWEL NUTRITION STATUS      Continent Diet (see DC summary)  AMBULATORY STATUS COMMUNICATION OF NEEDS Skin   Extensive Assist Verbally Normal, Surgical wounds                       Personal Care Assistance Level of Assistance  Bathing, Feeding, Dressing Bathing Assistance: Limited assistance Feeding assistance: Independent Dressing Assistance: Limited assistance     Functional Limitations Info  Sight, Speech Sight Info: Adequate   Speech Info: Adequate    SPECIAL CARE FACTORS FREQUENCY  PT (By licensed PT), OT (By licensed OT)     PT Frequency: 5 times per week OT Frequency: 5 times per week            Contractures Contractures Info: Not present    Additional Factors Info  Code Status, Allergies Code  Status Info: full code Allergies Info: tape           Current Medications (02/13/2023):  This is the current hospital active medication list Current Facility-Administered Medications  Medication Dose Route Frequency Provider Last Rate Last Admin   acetaminophen (TYLENOL) tablet 325-650 mg  325-650 mg Oral Q6H PRN Poggi, Excell Seltzer, MD       amLODipine (NORVASC) tablet 5 mg  5 mg Oral Daily Poggi, Excell Seltzer, MD   5 mg at  02/13/23 0981   aspirin EC tablet 81 mg  81 mg Oral Daily Poggi, Excell Seltzer, MD   81 mg at 02/13/23 0942   benazepril (LOTENSIN) tablet 20 mg  20 mg Oral Daily Poggi, Excell Seltzer, MD   20 mg at 02/13/23 1914   bisacodyl (DULCOLAX) suppository 10 mg  10 mg Rectal Daily PRN Poggi, Excell Seltzer, MD       cyanocobalamin (VITAMIN B12) tablet 1,000 mcg  1,000 mcg Oral Daily Poggi, Excell Seltzer, MD   1,000 mcg at 02/11/23 1009   diphenhydrAMINE (BENADRYL) 12.5 MG/5ML elixir 12.5-25 mg  12.5-25 mg Oral Q4H PRN Poggi, Excell Seltzer, MD       docusate sodium (COLACE) capsule 100 mg  100 mg Oral BID Poggi, Excell Seltzer, MD   100 mg at 02/13/23 0942   enoxaparin (LOVENOX) injection 40 mg  40 mg Subcutaneous Q24H Poggi, Excell Seltzer, MD   40 mg at 02/13/23 7829   hydrALAZINE (APRESOLINE) injection 5 mg  5 mg Intravenous Q2H PRN Poggi, Excell Seltzer, MD       magnesium hydroxide (MILK OF MAGNESIA) suspension 30 mL  30 mL Oral Daily PRN Poggi, Excell Seltzer, MD       methimazole (TAPAZOLE) tablet 5 mg  5 mg Oral Daily Poggi, Excell Seltzer, MD   5 mg at 02/13/23 5621   methocarbamol (ROBAXIN) tablet 500 mg  500 mg Oral Q8H PRN Poggi, Excell Seltzer, MD   500 mg at 02/13/23 3086   metoCLOPramide (REGLAN) tablet 5-10 mg  5-10 mg Oral Q8H PRN Poggi, Excell Seltzer, MD       Or   metoCLOPramide (REGLAN) injection 5-10 mg  5-10 mg Intravenous Q8H PRN Poggi, Excell Seltzer, MD       metoprolol succinate (TOPROL-XL) 24 hr tablet 50 mg  50 mg Oral Daily Poggi, Excell Seltzer, MD   50 mg at 02/13/23 5784   morphine (PF) 2 MG/ML injection 2 mg  2 mg Intravenous Q4H PRN Poggi, Excell Seltzer, MD       multivitamin with minerals tablet 1 tablet  1 tablet Oral Daily Poggi, Excell Seltzer, MD   1 tablet at 02/13/23 0942   ondansetron (ZOFRAN) tablet 4 mg  4 mg Oral Q6H PRN Poggi, Excell Seltzer, MD       Or   ondansetron (ZOFRAN) injection 4 mg  4 mg Intravenous Q6H PRN Poggi, Excell Seltzer, MD       oxyCODONE (Oxy IR/ROXICODONE) immediate release tablet 5-10 mg  5-10 mg Oral Q4H PRN Poggi, Excell Seltzer, MD   10 mg at 02/12/23 2222   polyethylene  glycol (MIRALAX / GLYCOLAX) packet 17 g  17 g Oral Daily Poggi, Excell Seltzer, MD   17 g at 02/13/23 0943   potassium chloride SA (KLOR-CON M) CR tablet 40 mEq  40 mEq Oral Once Poggi, Excell Seltzer, MD       senna-docusate (Senokot-S) tablet 1 tablet  1 tablet Oral BID Poggi, Excell Seltzer, MD   1  tablet at 02/13/23 0942   sodium phosphate (FLEET) enema 1 enema  1 enema Rectal Once PRN Poggi, Excell Seltzer, MD       Facility-Administered Medications Ordered in Other Encounters  Medication Dose Route Frequency Provider Last Rate Last Admin   sodium chloride flush (NS) 0.9 % injection 10 mL  10 mL Intravenous PRN Jeralyn Ruths, MD   10 mL at 03/07/20 0836   sodium chloride flush (NS) 0.9 % injection 10 mL  10 mL Intravenous PRN Jeralyn Ruths, MD   10 mL at 06/21/20 1259     Discharge Medications: Please see discharge summary for a list of discharge medications.  Relevant Imaging Results:  Relevant Lab Results:   Additional Information [id number]  Marlowe Sax, RN

## 2023-02-13 NOTE — Plan of Care (Signed)

## 2023-02-14 DIAGNOSIS — M62838 Other muscle spasm: Secondary | ICD-10-CM | POA: Diagnosis not present

## 2023-02-14 DIAGNOSIS — R29898 Other symptoms and signs involving the musculoskeletal system: Secondary | ICD-10-CM | POA: Diagnosis not present

## 2023-02-14 DIAGNOSIS — K5900 Constipation, unspecified: Secondary | ICD-10-CM | POA: Diagnosis not present

## 2023-02-14 DIAGNOSIS — S4291XD Fracture of right shoulder girdle, part unspecified, subsequent encounter for fracture with routine healing: Secondary | ICD-10-CM | POA: Diagnosis not present

## 2023-02-14 DIAGNOSIS — I350 Nonrheumatic aortic (valve) stenosis: Secondary | ICD-10-CM | POA: Diagnosis not present

## 2023-02-14 DIAGNOSIS — I5033 Acute on chronic diastolic (congestive) heart failure: Secondary | ICD-10-CM | POA: Diagnosis not present

## 2023-02-14 DIAGNOSIS — D519 Vitamin B12 deficiency anemia, unspecified: Secondary | ICD-10-CM | POA: Diagnosis not present

## 2023-02-14 DIAGNOSIS — E039 Hypothyroidism, unspecified: Secondary | ICD-10-CM | POA: Diagnosis not present

## 2023-02-14 DIAGNOSIS — S4291XA Fracture of right shoulder girdle, part unspecified, initial encounter for closed fracture: Secondary | ICD-10-CM | POA: Diagnosis not present

## 2023-02-14 DIAGNOSIS — E876 Hypokalemia: Secondary | ICD-10-CM | POA: Diagnosis not present

## 2023-02-14 DIAGNOSIS — Z7901 Long term (current) use of anticoagulants: Secondary | ICD-10-CM | POA: Diagnosis not present

## 2023-02-14 DIAGNOSIS — E0590 Thyrotoxicosis, unspecified without thyrotoxic crisis or storm: Secondary | ICD-10-CM | POA: Diagnosis not present

## 2023-02-14 DIAGNOSIS — E569 Vitamin deficiency, unspecified: Secondary | ICD-10-CM | POA: Diagnosis not present

## 2023-02-14 DIAGNOSIS — R531 Weakness: Secondary | ICD-10-CM | POA: Diagnosis not present

## 2023-02-14 DIAGNOSIS — I700 Atherosclerosis of aorta: Secondary | ICD-10-CM | POA: Diagnosis not present

## 2023-02-14 DIAGNOSIS — R278 Other lack of coordination: Secondary | ICD-10-CM | POA: Diagnosis not present

## 2023-02-14 DIAGNOSIS — Z789 Other specified health status: Secondary | ICD-10-CM | POA: Diagnosis not present

## 2023-02-14 DIAGNOSIS — M25511 Pain in right shoulder: Secondary | ICD-10-CM | POA: Diagnosis not present

## 2023-02-14 DIAGNOSIS — I442 Atrioventricular block, complete: Secondary | ICD-10-CM

## 2023-02-14 DIAGNOSIS — D120 Benign neoplasm of cecum: Secondary | ICD-10-CM | POA: Diagnosis not present

## 2023-02-14 DIAGNOSIS — G610 Guillain-Barre syndrome: Secondary | ICD-10-CM | POA: Diagnosis not present

## 2023-02-14 DIAGNOSIS — I10 Essential (primary) hypertension: Secondary | ICD-10-CM | POA: Diagnosis not present

## 2023-02-14 DIAGNOSIS — K588 Other irritable bowel syndrome: Secondary | ICD-10-CM | POA: Diagnosis not present

## 2023-02-14 DIAGNOSIS — E60 Dietary zinc deficiency: Secondary | ICD-10-CM | POA: Diagnosis not present

## 2023-02-14 DIAGNOSIS — Z736 Limitation of activities due to disability: Secondary | ICD-10-CM | POA: Diagnosis not present

## 2023-02-14 DIAGNOSIS — Z950 Presence of cardiac pacemaker: Secondary | ICD-10-CM | POA: Diagnosis not present

## 2023-02-14 DIAGNOSIS — M6259 Muscle wasting and atrophy, not elsewhere classified, multiple sites: Secondary | ICD-10-CM | POA: Diagnosis not present

## 2023-02-14 DIAGNOSIS — R2681 Unsteadiness on feet: Secondary | ICD-10-CM | POA: Diagnosis not present

## 2023-02-14 DIAGNOSIS — E871 Hypo-osmolality and hyponatremia: Secondary | ICD-10-CM | POA: Diagnosis not present

## 2023-02-14 DIAGNOSIS — R52 Pain, unspecified: Secondary | ICD-10-CM | POA: Diagnosis not present

## 2023-02-14 DIAGNOSIS — E669 Obesity, unspecified: Secondary | ICD-10-CM | POA: Diagnosis not present

## 2023-02-14 DIAGNOSIS — G4700 Insomnia, unspecified: Secondary | ICD-10-CM | POA: Diagnosis not present

## 2023-02-14 DIAGNOSIS — R112 Nausea with vomiting, unspecified: Secondary | ICD-10-CM | POA: Diagnosis not present

## 2023-02-14 DIAGNOSIS — Z471 Aftercare following joint replacement surgery: Secondary | ICD-10-CM | POA: Diagnosis not present

## 2023-02-14 DIAGNOSIS — W19XXXA Unspecified fall, initial encounter: Secondary | ICD-10-CM | POA: Diagnosis not present

## 2023-02-14 LAB — BASIC METABOLIC PANEL
Anion gap: 4 — ABNORMAL LOW (ref 5–15)
BUN: 10 mg/dL (ref 8–23)
CO2: 30 mmol/L (ref 22–32)
Calcium: 8.2 mg/dL — ABNORMAL LOW (ref 8.9–10.3)
Chloride: 100 mmol/L (ref 98–111)
Creatinine, Ser: 0.44 mg/dL (ref 0.44–1.00)
GFR, Estimated: >60 mL/min (ref >60)
Glucose, Bld: 111 mg/dL — ABNORMAL HIGH (ref 70–99)
Potassium: 3.4 mmol/L — ABNORMAL LOW (ref 3.5–5.1)
Sodium: 134 mmol/L — ABNORMAL LOW (ref 135–145)

## 2023-02-14 LAB — CBC
HCT: 27.6 % — ABNORMAL LOW (ref 36.0–46.0)
Hemoglobin: 9.5 g/dL — ABNORMAL LOW (ref 12.0–15.0)
MCH: 35.8 pg — ABNORMAL HIGH (ref 26.0–34.0)
MCHC: 34.4 g/dL (ref 30.0–36.0)
MCV: 104.2 fL — ABNORMAL HIGH (ref 80.0–100.0)
Platelets: 206 10*3/uL (ref 150–400)
RBC: 2.65 MIL/uL — ABNORMAL LOW (ref 3.87–5.11)
RDW: 13.7 % (ref 11.5–15.5)
WBC: 8.9 10*3/uL (ref 4.0–10.5)
nRBC: 0 % (ref 0.0–0.2)

## 2023-02-14 MED ORDER — TRAZODONE HCL 50 MG PO TABS: 25.0000 mg | ORAL_TABLET | Freq: Every evening | ORAL | Status: DC | PRN

## 2023-02-14 MED ORDER — OXYCODONE HCL 5 MG PO TABS: 5.0000 mg | ORAL_TABLET | ORAL | 0 refills | Status: DC | PRN

## 2023-02-14 MED ORDER — POLYETHYLENE GLYCOL 3350 17 G PO PACK: 17.0000 g | PACK | Freq: Every day | ORAL | Status: DC | PRN

## 2023-02-14 MED ORDER — METHOCARBAMOL 500 MG PO TABS: 500.0000 mg | ORAL_TABLET | Freq: Three times a day (TID) | ORAL | Status: AC | PRN

## 2023-02-14 MED ORDER — ENOXAPARIN SODIUM 40 MG/0.4ML IJ SOSY: 40.0000 mg | PREFILLED_SYRINGE | INTRAMUSCULAR | 0 refills | Status: DC

## 2023-02-14 MED ORDER — BISACODYL 10 MG RE SUPP: 10.0000 mg | Freq: Every day | RECTAL | Status: DC | PRN

## 2023-02-14 MED ORDER — POTASSIUM CHLORIDE CRYS ER 20 MEQ PO TBCR
40.0000 meq | EXTENDED_RELEASE_TABLET | Freq: Once | ORAL | Status: AC
  Administered 2023-02-14: 40 meq via ORAL
  Filled 2023-02-14: qty 2

## 2023-02-14 MED ORDER — ACETAMINOPHEN 325 MG PO TABS: 650.0000 mg | ORAL_TABLET | Freq: Four times a day (QID) | ORAL | Status: DC | PRN

## 2023-02-14 MED ORDER — ONDANSETRON HCL 4 MG PO TABS: 4.0000 mg | ORAL_TABLET | Freq: Four times a day (QID) | ORAL | Status: DC | PRN

## 2023-02-14 MED ORDER — DOCUSATE SODIUM 100 MG PO CAPS: 100.0000 mg | ORAL_CAPSULE | Freq: Two times a day (BID) | ORAL | Status: AC

## 2023-02-14 NOTE — TOC Progression Note (Signed)
Transition of Care [facility]) - Progression Note    Patient Details  Name: Maria Davies MRN: [id number] Date of Birth: 12-01-1940  Transition of Care [facility]) CM/SW Contact  Marlowe Sax, RN Phone Number: 02/14/2023, 9:24 AM  Clinical Narrative:    She will go to room 506P at [facility], her daughter will transport her   Expected Discharge Plan: Skilled Nursing Facility Barriers to Discharge: Continued Medical Work up  Expected Discharge Plan and Services       Living arrangements for the past 2 months: Single Family Home Expected Discharge Date: 02/14/23                                     Social Determinants of Health (SDOH) Interventions SDOH Screenings   Food Insecurity: No Food Insecurity (02/08/2023)  Housing: Low Risk  (02/08/2023)  Transportation Needs: No Transportation Needs (02/08/2023)  Utilities: Not At Risk (02/08/2023)  Alcohol Screen: Low Risk  (09/10/2022)  Depression (PHQ2-9): Low Risk  (11/05/2022)  Financial Resource Strain: Low Risk  (09/10/2022)  Physical Activity: Insufficiently Active (09/10/2022)  Social Connections: Moderately Isolated (09/10/2022)  Stress: No Stress Concern Present (09/10/2022)  Tobacco Use: Low Risk  (02/07/2023)    Readmission Risk Interventions    02/08/2023    1:10 PM  Readmission Risk Prevention Plan  Transportation Screening Complete  PCP or Specialist Appt within 5-7 Days Complete  Home Care Screening Complete  Medication Review (RN CM) Complete

## 2023-02-14 NOTE — TOC Progression Note (Signed)
Transition of Care [facility]) - Progression Note    Patient Details  Name: Maria Davies MRN: [id number] Date of Birth: 1940-07-02  Transition of Care [facility]) CM/SW Contact  Marlowe Sax, RN Phone Number: 02/14/2023, 9:00 AM  Clinical Narrative:    Met with the patient and reviewed bed offers, she chose [facility], NO Ins approval needed, I notified the team that she has a bed and can DC when ready, her daughter will transport   Expected Discharge Plan: Skilled Nursing Facility Barriers to Discharge: Continued Medical Work up  Expected Discharge Plan and Services       Living arrangements for the past 2 months: Single Family Home                                       Social Determinants of Health (SDOH) Interventions SDOH Screenings   Food Insecurity: No Food Insecurity (02/08/2023)  Housing: Low Risk  (02/08/2023)  Transportation Needs: No Transportation Needs (02/08/2023)  Utilities: Not At Risk (02/08/2023)  Alcohol Screen: Low Risk  (09/10/2022)  Depression (PHQ2-9): Low Risk  (11/05/2022)  Financial Resource Strain: Low Risk  (09/10/2022)  Physical Activity: Insufficiently Active (09/10/2022)  Social Connections: Moderately Isolated (09/10/2022)  Stress: No Stress Concern Present (09/10/2022)  Tobacco Use: Low Risk  (02/07/2023)    Readmission Risk Interventions    02/08/2023    1:10 PM  Readmission Risk Prevention Plan  Transportation Screening Complete  PCP or Specialist Appt within 5-7 Days Complete  Home Care Screening Complete  Medication Review (RN CM) Complete

## 2023-02-14 NOTE — Plan of Care (Signed)

## 2023-02-14 NOTE — Care Management Important Message (Addendum)
Important Message  Patient Details  Name: Maria Davies MRN: [id number] Date of Birth: [birth year]   Medicare Important Message Given:  Other (see comment)  Patient states she is not feeling well at the moment and could I check back with her later. I let her know I would follow-up with her again and hoped she felt better soon.  Update: Patient had already discharged when I went to her room.  Olegario Messier A Shaymus Eveleth 02/14/2023, 9:35 AM

## 2023-02-14 NOTE — Progress Notes (Signed)
Physical Therapy Treatment Patient Details Name: Maria Davies MRN: [id number] DOB: Feb 22, 1941 Today's Date: 02/14/2023   History of Present Illness Maria Davies is an 82yoF who comes to [facility] ED on 8/22 after mechanical fall at home, imaging shows Rt shoulder fracture, taken to OR with Leron Croak for rTSA 02/12/23. Pt lives alone at baseline, community AMB without device, history of neuropathy and prior fall with Left humerus injury.    PT Comments  Pt in bed on entry, meal completed, already up to chair and back to bed. NA taking vitals on entry revealing of BP 90s/40s, HR 114bpm, pt without symptoms. Pt moving better to EOB, no assist needed. She asks for help getting her polar care and sling donned correctly as everything has shifted considerably- this takes several minutes. Pt partakes in STS and static balance for a bit, then asks for assist ot pivot transfer to toilet. NA comes in to assist. AMB held until vitals check in 15 minutes. Pt orthostatic and presyncopal previous day.    If plan is discharge home, recommend the following: A lot of help with walking and/or transfers;A lot of help with bathing/dressing/bathroom;Assist for transportation;Assistance with cooking/housework   Can travel by private vehicle     No  Equipment Recommendations  None recommended by PT    Recommendations for Other Services       Precautions / Restrictions Precautions Precautions: Fall;Shoulder Shoulder Interventions: Shoulder sling/immobilizer;Shoulder abduction pillow;Don joy ultra sling;Off for dressing/bathing/exercises;At all times Restrictions Weight Bearing Restrictions: Yes RUE Weight Bearing: Non weight bearing     Mobility  Bed Mobility Overal bed mobility: Needs Assistance Bed Mobility: Supine to Sit     Supine to sit: Supervision          Transfers Overall transfer level: Needs assistance   Transfers: Sit to/from Stand, Bed to chair/wheelchair/BSC Sit to Stand: Contact  guard assist Stand pivot transfers: Supervision, From elevated surface         General transfer comment: max effort to rise from EOB, trunk thrust technique    Ambulation/Gait Ambulation/Gait assistance:  (deferred; in bed, pt is hypotensive and teachycardic, was orthotatic previous day)                 Stairs             Wheelchair Mobility     Tilt Bed    Modified Rankin (Stroke Patients Only)       Balance                                            Cognition Arousal: Alert Behavior During Therapy: WFL for tasks assessed/performed Overall Cognitive Status: Within Functional Limits for tasks assessed                                          Exercises Other Exercises Other Exercises: STS at bedside 3x15sec for standind static balance, hands free    General Comments        Pertinent Vitals/Pain Pain Assessment Pain Assessment: No/denies pain    Home Living                          Prior Function  PT Goals (current goals can now be found in the care plan section) Acute Rehab PT Goals Patient Stated Goal: improve balance, avoid fall PT Goal Formulation: With patient Time For Goal Achievement: 02/27/23 Potential to Achieve Goals: Fair Progress towards PT goals: Progressing toward goals;Not progressing toward goals - comment    Frequency    7X/week      PT Plan      Co-evaluation              AM-PAC PT "6 Clicks" Mobility   Outcome Measure  Help needed turning from your back to your side while in a flat bed without using bedrails?: A Lot Help needed moving from lying on your back to sitting on the side of a flat bed without using bedrails?: A Lot Help needed moving to and from a bed to a chair (including a wheelchair)?: A Little Help needed standing up from a chair using your arms (e.g., wheelchair or bedside chair)?: A Little Help needed to walk in hospital room?: A  Little Help needed climbing 3-5 steps with a railing? : A Lot 6 Click Score: 15    End of Session   Activity Tolerance: Patient tolerated treatment well;No increased pain Patient left: in chair;with nursing/sitter in room Nurse Communication: Mobility status PT Visit Diagnosis: Other abnormalities of gait and mobility (R26.89);Difficulty in walking, not elsewhere classified (R26.2);Muscle weakness (generalized) (M62.81)     Time: 2536-6440 PT Time Calculation (min) (ACUTE ONLY): 19 min  Charges:    $Therapeutic Activity: 8-22 mins PT General Charges $$ ACUTE PT VISIT: 1 Visit                    11:25 AM, 02/14/23 Rosamaria Lints, PT, DPT Physical Therapist - [facility]  [phone] (ASCOM)     [name] 02/14/2023, 11:23 AM

## 2023-02-14 NOTE — Progress Notes (Signed)
Subjective: 2 Days Post-Op Procedure(s) (LRB): REVERSE SHOULDER ARTHROPLASTY (Right) Patient reports pain as mild in the right shoulder.  Does report sensation is beginning to return to the arm. Patient is well, and has had no acute complaints or problems PT and care management to assist with discharge planning.  Current plan is for d/c to SNF. Negative for chest pain and shortness of breath Fever: no Gastrointestinal:Negative for nausea and vomiting Reports that she is passing gas this morning.  Objective: Vital signs in last 24 hours: Temp:  [97.6 F (36.4 C)-98.2 F (36.8 C)] 98.2 F (36.8 C) (08/28 2333) Pulse Rate:  [81-95] 95 (08/28 2333) Resp:  [18] 18 (08/28 2333) BP: (109-127)/(61-74) 127/74 (08/28 2333) SpO2:  [98 %-100 %] 100 % (08/28 2333) Weight:  [92.5 kg] 92.5 kg (08/29 0500)  Intake/Output from previous day: No intake or output data in the 24 hours ending 02/14/23 0718   Intake/Output this shift: No intake/output data recorded.  Labs: Recent Labs    02/13/23 0347 02/14/23 0410  HGB 10.1* 9.5*   Recent Labs    02/13/23 0347 02/14/23 0410  WBC 9.6 8.9  RBC 2.75* 2.65*  HCT 28.2* 27.6*  PLT 178 206   Recent Labs    02/13/23 0347 02/14/23 0410  NA 133* 134*  K 3.7 3.4*  CL 97* 100  CO2 28 30  BUN 11 10  CREATININE 0.47 0.44  GLUCOSE 109* 111*  CALCIUM 8.3* 8.2*   No results for input(s): "LABPT", "INR" in the last 72 hours.  EXAM General - Patient is Alert, Appropriate, and Oriented Extremity - ABD soft Sling intact to the right arm. Decreased sensation to the right arm from previous right interscalene block however improved this morning.  Reports sensation over the deltoid. Dressing/Incision - clean, dry, no drainage noted to the right knee honeycomb dressing. Motor Function - intact, moving foot and toes well on exam. Moving fingers this morning without pain.  Moderate swelling in the right hand. Abdomen soft with intact bowel  sounds.  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   a.) stage IIIb; b.) RIGHT colon mass Bx (+) for invasive moderately differentiated adenocarcinoma (pT3 pN1a)   Anemia    Aortic atherosclerosis (HCC)    Aortic stenosis    a.) TTE 08/17/2016: EF >55%, mild AS (MPG 29.5); b.) TTE 08/25/2018: EF >55%; mod AS (MPG 42.3); c.) TTE 06/17/2020: EF >55%; severe AS (MPG 47.2); d.) s/p TAVR 08/31/2020 at [facility] - 23 mm Edwards Sapien 3 Ultra valve placed via a RIGHT percutaneous femoral approach --> complicated by intra/postoperative CHB requiring TVP and ultimate PPM placement   Arthritis    Basal cell carcinoma (BCC) of back    Bilateral leg edema    Breast cancer, right (HCC) 2011   a.) stage Ia IMC (ER/PR +, HER2/neu -); b.) lumpectomy + XRT in 2011; c.) completed 5 years AI therapy (anastrozole) in 11/2014   Cervical spinal stenosis    Cervical spondylosis    Chemotherapy induced nausea and vomiting    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   a.) TVP placed intraoperatively during TAVR secondary to development of CHB; b.) Medtronic PPM placed 09/03/2020 for postoperative continued postoperative CHB   Coronary artery disease    a.) RHC 08/11/2020: no sig CAD   DDD (degenerative disc disease), cervical    Diastolic dysfunction    a.) TTE 08/17/2016: EF >55%, mild LVH, mild LAE, triv panvalvular regurg, G1DD; b.) TTE  08/25/2018: EF >55%, mod LVH, mild LAE, triv AR/TR/PR, mild MR, G1dd; c.) TTE 08/11/2020: EF >55%, mild LVHH, mod LAE, triv MR/TR   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   Heart murmur    History of bilateral cataract extraction    Hyperplastic colon polyp    Hypertension    Hyperthyroidism    Neuropathy    Osteopenia    PONV (postoperative nausea and vomiting)    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   a.) TVP placed intraoperatively during TAVR secondary to development of CHB; b.) Medtronic PPM placed 09/03/2020 for  postoperative continued postoperative CHB   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   a.) s/p TAVR 08/31/2020 at [facility] - 23 mm Edwards Sapien 3 Ultra valve placed via a RIGHT percutaneous femoral approach --> complicated by intra/postoperative CHB requiring TVP and ultimate PPM placement   Thyroid nodule 01/25/2022   a.) CT C-spine 01/25/2022: 22 mm right thyroid nodule   Assessment/Plan: 2 Days Post-Op Procedure(s) (LRB): REVERSE SHOULDER ARTHROPLASTY (Right) Principal Problem:   Closed fracture of right shoulder Active Problems:   Aortic stenosis, mild   Hypertension   Hyperthyroidism   Aortic atherosclerosis (HCC)   Complete heart block (HCC)   Fall at home, initial encounter   Acute on chronic diastolic CHF (congestive heart failure) (HCC)   Hypokalemia   Obesity (BMI 30-39.9)   Alcohol use   Hyponatremia   Closed fracture dislocation of right shoulder  Estimated body mass index is 36.12 kg/m as calculated from the following:   Height as of this encounter: 5\' 3"  (1.6 m).   Weight as of this encounter: 92.5 kg. Advance diet Up with therapy D/C IV fluids when tolerating po intake.  Labs reviewed this AM, WBC 8.9, Hg 9.5 this morning. Up with therapy today.  Current plan is for d/c to SNF. Continue to work on BM. Care management to assist with discharge.  Following discharge to SNF, continue with Lovenox 40mg  daily for 14 days. Follow-up with [facility] in 10-14 days for staple removal.  DVT Prophylaxis - Lovenox and TED hose Non-weightbearing to the right arm.  Valeria Batman, PA-C [facility] 02/14/2023, 7:18 AM

## 2023-02-14 NOTE — Plan of Care (Signed)
Patient discharged per MD orders at this time.All dc instructions, education and medications reviewed with the patient.Pt expressed understanding and will comply with dc instructions.follow up appointments was also communicated to the Pt.no verbal c/o or any ssx of distress.Pt was discharged to the [facility] for STR/PT/OT services per order.report was called to staff nurse Presina before transport.Pt was transported by daughter in a privately owned vehicle.

## 2023-02-14 NOTE — Discharge Instructions (Signed)
Diet: As you were doing prior to hospitalization  ? ?Shower:  May shower but keep the wounds dry, use an occlusive plastic wrap, NO SOAKING IN TUB.  If the bandage gets wet, change with a clean dry gauze. ? ?Dressing:  You may change your dressing as needed. Change the dressing with sterile gauze dressing.   ? ?Activity:  Increase activity slowly as tolerated, but follow the weight bearing instructions below.  No lifting or driving for 6 weeks. ? ?Weight Bearing:   Non-weightbearing to the right arm. ? ?To prevent constipation: you may use a stool softener such as - ? ?Colace (over the counter) 100 mg by mouth twice a day  ?Drink plenty of fluids (prune juice may be helpful) and high fiber foods ?Miralax (over the counter) for constipation as needed.   ? ?Itching:  If you experience itching with your medications, try taking only a single pain pill, or even half a pain pill at a time.  You may take up to 10 pain pills per day, and you can also use benadryl over the counter for itching or also to help with sleep.  ? ?Precautions:  If you experience chest pain or shortness of breath - call 911 immediately for transfer to the hospital emergency department!! ? ?If you develop a fever greater that 101 F, purulent drainage from wound, increased redness or drainage from wound, or calf pain-Call [facility]                                              ?Follow- Up Appointment:  Please call for an appointment to be seen in 2 weeks at [facility]  ?

## 2023-02-14 NOTE — Discharge Summary (Signed)
Physician Discharge Summary  Maria Davies WUJ:[id number] DOB: 08-25-1940 DOA: 02/07/2023  PCP: Dorcas Carrow, DO  Admit date: 02/07/2023 Discharge date: 02/14/2023  Admitted From: Home  Discharge disposition: Skilled nursing facility   Recommendations for Outpatient Follow-Up:   Follow up with your primary care provider at the skilled nursing facility in 3 to 5 days. Check CBC, BMP, magnesium in the next visit Follow-up with [facility] in 10 to 14 days for staple removal.  Non weightbearing of the right upper extremity.   Discharge Diagnosis:   Principal Problem:   Closed fracture of right shoulder Active Problems:   Fall at home, initial encounter   Acute on chronic diastolic CHF (congestive heart failure) (HCC)   Hypokalemia   Hyponatremia   Complete heart block (HCC)   Aortic stenosis, mild   Hypertension   Hyperthyroidism   Aortic atherosclerosis (HCC)   Obesity (BMI 30-39.9)   Alcohol use   Closed fracture dislocation of right shoulder   Discharge Condition: Improved.  Diet recommendation: Low sodium, heart healthy.    Wound care: None.  Code status: Full.   History of Present Illness:   Maria Davies is a 82 y.o. female with medical history significant of diastolic congestive heart failure PPM due to hx of complete heart block, aortic stenosis, s/p of TAVR, HTN, HLD, hypothyroidism, Guillain-Barr syndrome, breast cancer (right lumpectomy, radiation and chemotherapy), colon cancer 2021 (s/p partial colectomy and s/p of chemotherapy), alcohol use, who presented with fall and pain in the right shoulder.  Fall was accidental.  In the ED patient was noted to be hypokalemic with a potassium of 2.3, mild hyponatremia with sodium of 132 . Right shoulder x-ray and CT scan showed acute comminuted intra-articular right humeral head and neck fracture with humeral head displaced anteriorly and impacted on the anterior glenoid. Hematoma of rotator cuff muscles  at humeral head insertion.  Orthopedics was consulted and patient was admitted to hospital for further evaluation and treatment.   Hospital Course:   Following conditions were addressed during hospitalization as listed below,  Fall at home  closed fracture of right shoulder Status post Reverse right total shoulder arthroplasty on 02/12/2023.  Nonweightbearing recommended.  Continue sling.  Will need to follow-up in 10 to 14 days for staple removal with [facility].  Physical therapy has recommended skilled nursing facility at this time.  Continue pain control on discharge.   Hyponatremia Was taken off HCTZ.  Has improved at this time.  Latest sodium of 134.   Hypokalemia Potassium 3.4 today.  Has been replenished.  Check levels in few days as outpatient.   Acute on chronic diastolic CHF (congestive heart failure) (HCC) 2D Echo this admission EF   60-65%, grade I diastolic dysfunction, mild LVH, mild MR.  Pt has some mild BLE edema, mild DOE reported at home, BNP normal 63.8 History of Lasix during hospitalization.  Currently off due to hyponatremia. -Continue Toprol-XL   Complete heart block (HCC) s/p dual-chamber pacemaker 08/2020    Alcohol use Pt reported 3-4 small beverages most days on admission.  Was put on CIWA protocol but done no withdrawal symptoms.  Stopped CIWA.   Obesity (BMI 30-39.9) Body mass index is 34.99 kg/m.  Would benefit from weight loss as outpatient.     Aortic atherosclerosis (HCC) Continue ASA   Hyperthyroidism Continue methimazole    Hypertension --Amlodipine, Lotensin, metoprolol.  DC HCTZ on discharge.   Aortic stenosis, mild S/p TAVR in 08/2020  Disposition.  At this time, patient is stable for disposition to skilled nursing facility with outpatient orthopedic follow-up..  Medical Consultants:   Orthopedics  Procedures:    Reverse right shoulder arthroplasty on 02/12/2023. Subjective:   Today, patient was seen and examined at  bedside.  Complains of swelling of her hands.  Has mild shortness of breath and discomfort in the shoulder.  Discharge Exam:   Vitals:   02/13/23 2333 02/14/23 0916  BP: 127/74 94/82  Pulse: 95 (!) 114  Resp: 18 19  Temp: 98.2 F (36.8 C) 98.5 F (36.9 C)  SpO2: 100% 97%   Vitals:   02/13/23 1722 02/13/23 2333 02/14/23 0500 02/14/23 0916  BP: 109/61 127/74  94/82  Pulse: 87 95  (!) 114  Resp: 18 18  19   Temp: 97.6 F (36.4 C) 98.2 F (36.8 C)  98.5 F (36.9 C)  TempSrc:  Oral    SpO2: 98% 100%  97%  Weight:   92.5 kg   Height:       Body mass index is 36.12 kg/m.  General: Alert awake, not in obvious distress, obese built HENT: pupils equally reacting to light,  No scleral pallor or icterus noted. Oral mucosa is moist.  Chest:  Clear breath sounds.  Diminished breath sounds bilaterally. No crackles or wheezes.  CVS: S1 &S2 heard. No murmur.  Regular rate and rhythm. Abdomen: Soft, nontender, nondistended.  Bowel sounds are heard.   Extremities: No cyanosis, clubbing or edema.  Peripheral pulses are palpable.  Right shoulder with a splint and some swelling of the fingers of the hand.  Capillary refill present. Psych: Alert, awake and oriented, normal mood CNS:  No cranial nerve deficits.   Skin: Warm and dry.  No rashes noted.  The results of significant diagnostics from this hospitalization (including imaging, microbiology, ancillary and laboratory) are listed below for reference.     Diagnostic Studies:   ECHOCARDIOGRAM COMPLETE  Result Date: 02/09/2023    ECHOCARDIOGRAM REPORT   Patient Name:   Maria Davies Date of Exam: 02/08/2023 Medical Rec #:  [id number]        Height:       63.0 in Accession #:    [id number]       Weight:       197.5 lb Date of Birth:  [birth year]        BSA:          1.923 m Patient Age:    82 years         BP:           118/59 mmHg Patient Gender: F                HR:           77 bpm. Exam Location:  [facility] Procedure: 2D Echo, Cardiac Doppler  and Color Doppler Indications:     Aortic stenosis I35.0  History:         Patient has no prior history of Echocardiogram examinations.                  Risk Factors:Hypertension. S/P TAVR.  Sonographer:     Cristela Blue Referring Phys:  [id number] CARALYN HUDSON Diagnosing Phys: Rozell Searing Custovic IMPRESSIONS  1. Left ventricular ejection fraction, by estimation, is 60 to 65%. Left ventricular ejection fraction by PLAX is 68 %. The left ventricle has normal function. The left ventricle has no regional wall motion abnormalities. There is mild left ventricular hypertrophy.  Left ventricular diastolic parameters are consistent with Grade I diastolic dysfunction (impaired relaxation).  2. Right ventricular systolic function is normal. The right ventricular size is mildly enlarged.  3. Left atrial size was mildly dilated.  4. The mitral valve is grossly normal. Mild mitral valve regurgitation.  5. The aortic valve is grossly normal. Aortic valve regurgitation is not visualized. Echo findings are consistent with normal structure and function of the aortic valve prosthesis. FINDINGS  Left Ventricle: Left ventricular ejection fraction, by estimation, is 60 to 65%. Left ventricular ejection fraction by PLAX is 68 %. The left ventricle has normal function. The left ventricle has no regional wall motion abnormalities. The left ventricular internal cavity size was normal in size. There is mild left ventricular hypertrophy. Left ventricular diastolic parameters are consistent with Grade I diastolic dysfunction (impaired relaxation). Right Ventricle: The right ventricular size is mildly enlarged. No increase in right ventricular wall thickness. Right ventricular systolic function is normal. Left Atrium: Left atrial size was mildly dilated. Right Atrium: Right atrial size was normal in size. Pericardium: There is no evidence of pericardial effusion. Mitral Valve: The mitral valve is grossly normal. Mild mitral valve regurgitation. MV peak  gradient, 8.3 mmHg. The mean mitral valve gradient is 3.0 mmHg. Tricuspid Valve: The tricuspid valve is grossly normal. Tricuspid valve regurgitation is trivial. Aortic Valve: The aortic valve is grossly normal. Aortic valve regurgitation is not visualized. Aortic valve mean gradient measures 16.5 mmHg. Aortic valve peak gradient measures 29.0 mmHg. Aortic valve area, by VTI measures 1.18 cm. Echo findings are consistent with normal structure and function of the aortic valve prosthesis. Pulmonic Valve: The pulmonic valve was grossly normal. Pulmonic valve regurgitation is trivial. Aorta: The aortic root and ascending aorta are structurally normal, with no evidence of dilitation. IAS/Shunts: No atrial level shunt detected by color flow Doppler.  LEFT VENTRICLE PLAX 2D LV EF:         Left            Diastology                ventricular     LV e' medial:    4.35 cm/s                ejection        LV E/e' medial:  19.5                fraction by     LV e' lateral:   8.38 cm/s                PLAX is 68      LV E/e' lateral: 10.1                %. LVIDd:         4.50 cm LVIDs:         2.80 cm LV PW:         1.50 cm LV IVS:        1.80 cm LVOT diam:     2.00 cm LV SV:         59 LV SV Index:   31 LVOT Area:     3.14 cm  LEFT ATRIUM             Index        RIGHT ATRIUM           Index LA diam:        4.30 cm 2.24 cm/m  RA Area:     12.60 cm LA Vol (A2C):   24.4 ml 12.69 ml/m  RA Volume:   24.00 ml  12.48 ml/m LA Vol (A4C):   64.6 ml 33.59 ml/m LA Biplane Vol: 42.8 ml 22.25 ml/m  AORTIC VALVE AV Area (Vmax):    1.18 cm AV Area (Vmean):   1.17 cm AV Area (VTI):     1.18 cm AV Vmax:           269.25 cm/s AV Vmean:          190.500 cm/s AV VTI:            0.503 m AV Peak Grad:      29.0 mmHg AV Mean Grad:      16.5 mmHg LVOT Vmax:         101.00 cm/s LVOT Vmean:        71.100 cm/s LVOT VTI:          0.189 m LVOT/AV VTI ratio: 0.38  AORTA Ao Root diam: 2.20 cm MITRAL VALVE                TRICUSPID VALVE MV Area  (PHT): 4.46 cm     TR Peak grad:   15.4 mmHg MV Area VTI:   2.36 cm     TR Vmax:        196.00 cm/s MV Peak grad:  8.3 mmHg MV Mean grad:  3.0 mmHg     SHUNTS MV Vmax:       1.44 m/s     Systemic VTI:  0.19 m MV Vmean:      81.3 cm/s    Systemic Diam: 2.00 cm MV Decel Time: 170 msec MV E velocity: 84.90 cm/s MV A velocity: 131.00 cm/s MV E/A ratio:  0.65 Statistician Electronically signed by Clotilde Dieter Signature Date/Time: 02/09/2023/12:12:16 PM    Final      Labs:   Basic Metabolic Panel: Recent Labs  Lab 02/07/23 1459 02/08/23 0451 02/10/23 0453 02/10/23 1822 02/11/23 0458 02/12/23 0408 02/13/23 0347 02/14/23 0410  NA 132*   < > 128* 130* 135 135 133* 134*  K 2.3*   < > 3.3* 4.0 4.5 3.4* 3.7 3.4*  CL 92*   < > 93* 94* 97* 99 97* 100  CO2 24   < > 28 27 28 29 28 30   GLUCOSE 177*   < > 105* 108* 113* 107* 109* 111*  BUN 13   < > 9 7* 9 6* 11 10  CREATININE 0.63   < > 0.39* 0.39* 0.38* 0.38* 0.47 0.44  CALCIUM 8.6*   < > 8.5* 8.6* 8.9 8.8* 8.3* 8.2*  MG 1.8  --  1.9  --   --  2.0  --   --   PHOS 2.7  --   --   --   --   --   --   --    < > = values in this interval not displayed.   GFR Estimated Creatinine Clearance: 60.6 mL/min (by C-G formula based on SCr of 0.44 mg/dL). Liver Function Tests: Recent Labs  Lab 02/07/23 1459  AST 32  ALT 22  ALKPHOS 68  BILITOT 1.3*  PROT 6.4*  ALBUMIN 3.6   No results for input(s): "LIPASE", "AMYLASE" in the last 168 hours. No results for input(s): "AMMONIA" in the last 168 hours. Coagulation profile Recent Labs  Lab 02/07/23 1459  INR 1.0    CBC: Recent Labs  Lab  02/07/23 1459 02/08/23 0451 02/09/23 0446 02/10/23 0453 02/13/23 0347 02/14/23 0410  WBC 9.5 11.0* 8.7 9.6 9.6 8.9  NEUTROABS 7.1  --   --   --   --   --   HGB 12.8 11.6* 11.0* 10.8* 10.1* 9.5*  HCT 34.5* 32.1* 31.3* 29.7* 28.2* 27.6*  MCV 99.1 99.7 104.3* 101.0* 102.5* 104.2*  PLT 155 144* 118* 140* 178 206   Cardiac Enzymes: No results for  input(s): "CKTOTAL", "CKMB", "CKMBINDEX", "TROPONINI" in the last 168 hours. BNP: Invalid input(s): "POCBNP" CBG: No results for input(s): "GLUCAP" in the last 168 hours. D-Dimer No results for input(s): "DDIMER" in the last 72 hours. Hgb A1c No results for input(s): "HGBA1C" in the last 72 hours. Lipid Profile No results for input(s): "CHOL", "HDL", "LDLCALC", "TRIG", "CHOLHDL", "LDLDIRECT" in the last 72 hours. Thyroid function studies No results for input(s): "TSH", "T4TOTAL", "T3FREE", "THYROIDAB" in the last 72 hours.  Invalid input(s): "FREET3" Anemia work up No results for input(s): "VITAMINB12", "FOLATE", "FERRITIN", "TIBC", "IRON", "RETICCTPCT" in the last 72 hours. Microbiology No results found for this or any previous visit (from the past 240 hour(s)).   Discharge Instructions:   Discharge Instructions     Call MD for:  redness, tenderness, or signs of infection (pain, swelling, redness, odor or green/yellow discharge around incision site)   Complete by: As directed    Call MD for:  severe uncontrolled pain   Complete by: As directed    Call MD for:  temperature >100.4   Complete by: As directed    Diet - low sodium heart healthy   Complete by: As directed    Discharge instructions   Complete by: As directed    Follow-up with primary care provider at the skilled nursing facility in 3 to 5 days.  Check blood work at that time.  Follow-up with [facility] in 10 to 14 days for staple removal.  Seek medical attention for worsening symptoms.  Nonweightbearing on the right arm.   Increase activity slowly   Complete by: As directed       Allergies as of 02/14/2023       Reactions   Tape    Prefers Paper tape be used        Medication List     STOP taking these medications    HYALURONIC ACID PO   hydrochlorothiazide 12.5 MG capsule Commonly known as: MICROZIDE   ibuprofen 200 MG tablet Commonly known as: ADVIL   silver sulfADIAZINE 1 %  cream Commonly known as: Silvadene   sulfamethoxazole-trimethoprim 800-160 MG tablet Commonly known as: BACTRIM DS       TAKE these medications    acetaminophen 325 MG tablet Commonly known as: TYLENOL Take 2 tablets (650 mg total) by mouth every 6 (six) hours as needed for mild pain or moderate pain (pain score 1-3 or temp > 100.5).   amLODipine 5 MG tablet Commonly known as: NORVASC Take 1 tablet (5 mg total) by mouth every morning.   aspirin EC 81 MG tablet Take 81 mg by mouth daily.   benazepril 20 MG tablet Commonly known as: LOTENSIN Take 1 tablet (20 mg total) by mouth every morning.   bisacodyl 10 MG suppository Commonly known as: DULCOLAX Place 1 suppository (10 mg total) rectally daily as needed for moderate constipation or severe constipation.   cyanocobalamin 1000 MCG tablet Commonly known as: VITAMIN B12 Take 1,000 mcg by mouth daily.   docusate sodium 100 MG capsule Commonly known as:  COLACE Take 1 capsule (100 mg total) by mouth 2 (two) times daily for 5 days.   enoxaparin 40 MG/0.4ML injection Commonly known as: LOVENOX Inject 0.4 mLs (40 mg total) into the skin daily.   methimazole 5 MG tablet Commonly known as: TAPAZOLE Take by mouth.   methocarbamol 500 MG tablet Commonly known as: ROBAXIN Take 1 tablet (500 mg total) by mouth every 8 (eight) hours as needed for up to 10 days for muscle spasms.   metoprolol succinate 50 MG 24 hr tablet Commonly known as: TOPROL-XL TAKE 1 TABLET BY MOUTH ONCE DAILY WITH OR IMMEDIATELY FOLLOWING A MEAL   Multi-Vitamins Tabs Take 1 tablet by mouth daily.   ondansetron 4 MG tablet Commonly known as: ZOFRAN Take 1 tablet (4 mg total) by mouth every 6 (six) hours as needed for nausea or vomiting.   oxyCODONE 5 MG immediate release tablet Commonly known as: Oxy IR/ROXICODONE Take 1-2 tablets (5-10 mg total) by mouth every 4 (four) hours as needed for moderate pain (pain score 4-6).   polyethylene glycol 17  g packet Commonly known as: MIRALAX / GLYCOLAX Take 17 g by mouth daily as needed for moderate constipation or mild constipation.   PROBIOTIC ADVANCED PO Take 1 capsule by mouth daily. When taking antibiotics pt will take 2 capsules instead of 1, normally just takes 1 per day otherwise   traZODone 50 MG tablet Commonly known as: DESYREL Take 0.5 tablets (25 mg total) by mouth at bedtime as needed for sleep.   ZINC ACETATE PO Take by mouth.        Contact information for follow-up providers     Anson [place], PA-C Follow up in 14 day(s).   Specialty: Physician Assistant Why: [facility] Contact information: [address] [place] [address] [phone]              Contact information for after-discharge care     Destination     [facility] Preferred SNF .   Service: Skilled Nursing Contact information: [address] [place] [address] [phone]                      Time coordinating discharge: 39 minutes  Signed:  Imanii Gosdin  [facility] 02/14/2023, 9:20 AM

## 2023-02-15 ENCOUNTER — Other Ambulatory Visit: Payer: Medicare Other

## 2023-02-15 ENCOUNTER — Ambulatory Visit: Payer: Medicare Other | Admitting: Oncology

## 2023-02-15 DIAGNOSIS — S4291XD Fracture of right shoulder girdle, part unspecified, subsequent encounter for fracture with routine healing: Secondary | ICD-10-CM | POA: Diagnosis not present

## 2023-02-18 DIAGNOSIS — E871 Hypo-osmolality and hyponatremia: Secondary | ICD-10-CM | POA: Diagnosis not present

## 2023-02-18 DIAGNOSIS — E876 Hypokalemia: Secondary | ICD-10-CM | POA: Diagnosis not present

## 2023-02-18 DIAGNOSIS — S4291XD Fracture of right shoulder girdle, part unspecified, subsequent encounter for fracture with routine healing: Secondary | ICD-10-CM | POA: Diagnosis not present

## 2023-02-18 DIAGNOSIS — I5033 Acute on chronic diastolic (congestive) heart failure: Secondary | ICD-10-CM | POA: Diagnosis not present

## 2023-02-21 DIAGNOSIS — I5033 Acute on chronic diastolic (congestive) heart failure: Secondary | ICD-10-CM | POA: Diagnosis not present

## 2023-02-21 DIAGNOSIS — S4291XD Fracture of right shoulder girdle, part unspecified, subsequent encounter for fracture with routine healing: Secondary | ICD-10-CM | POA: Diagnosis not present

## 2023-02-21 DIAGNOSIS — E876 Hypokalemia: Secondary | ICD-10-CM | POA: Diagnosis not present

## 2023-02-21 DIAGNOSIS — E871 Hypo-osmolality and hyponatremia: Secondary | ICD-10-CM | POA: Diagnosis not present

## 2023-02-26 DIAGNOSIS — I10 Essential (primary) hypertension: Secondary | ICD-10-CM | POA: Diagnosis not present

## 2023-02-26 DIAGNOSIS — I5033 Acute on chronic diastolic (congestive) heart failure: Secondary | ICD-10-CM | POA: Diagnosis not present

## 2023-02-26 DIAGNOSIS — S4291XD Fracture of right shoulder girdle, part unspecified, subsequent encounter for fracture with routine healing: Secondary | ICD-10-CM | POA: Diagnosis not present

## 2023-03-01 DIAGNOSIS — S4291XD Fracture of right shoulder girdle, part unspecified, subsequent encounter for fracture with routine healing: Secondary | ICD-10-CM | POA: Diagnosis not present

## 2023-03-01 DIAGNOSIS — I10 Essential (primary) hypertension: Secondary | ICD-10-CM | POA: Diagnosis not present

## 2023-03-05 DIAGNOSIS — I10 Essential (primary) hypertension: Secondary | ICD-10-CM | POA: Diagnosis not present

## 2023-03-05 DIAGNOSIS — S4291XD Fracture of right shoulder girdle, part unspecified, subsequent encounter for fracture with routine healing: Secondary | ICD-10-CM | POA: Diagnosis not present

## 2023-03-08 DIAGNOSIS — I10 Essential (primary) hypertension: Secondary | ICD-10-CM | POA: Diagnosis not present

## 2023-03-08 DIAGNOSIS — S4291XD Fracture of right shoulder girdle, part unspecified, subsequent encounter for fracture with routine healing: Secondary | ICD-10-CM | POA: Diagnosis not present

## 2023-03-08 DIAGNOSIS — I5033 Acute on chronic diastolic (congestive) heart failure: Secondary | ICD-10-CM | POA: Diagnosis not present

## 2023-03-12 DIAGNOSIS — S4291XD Fracture of right shoulder girdle, part unspecified, subsequent encounter for fracture with routine healing: Secondary | ICD-10-CM | POA: Diagnosis not present

## 2023-03-12 DIAGNOSIS — I5033 Acute on chronic diastolic (congestive) heart failure: Secondary | ICD-10-CM | POA: Diagnosis not present

## 2023-03-12 DIAGNOSIS — E871 Hypo-osmolality and hyponatremia: Secondary | ICD-10-CM | POA: Diagnosis not present

## 2023-03-12 DIAGNOSIS — I10 Essential (primary) hypertension: Secondary | ICD-10-CM | POA: Diagnosis not present

## 2023-03-15 DIAGNOSIS — S4291XD Fracture of right shoulder girdle, part unspecified, subsequent encounter for fracture with routine healing: Secondary | ICD-10-CM | POA: Diagnosis not present

## 2023-03-15 DIAGNOSIS — I700 Atherosclerosis of aorta: Secondary | ICD-10-CM | POA: Diagnosis not present

## 2023-03-15 DIAGNOSIS — D630 Anemia in neoplastic disease: Secondary | ICD-10-CM | POA: Diagnosis not present

## 2023-03-15 DIAGNOSIS — Z7982 Long term (current) use of aspirin: Secondary | ICD-10-CM | POA: Diagnosis not present

## 2023-03-15 DIAGNOSIS — I5033 Acute on chronic diastolic (congestive) heart failure: Secondary | ICD-10-CM | POA: Diagnosis not present

## 2023-03-15 DIAGNOSIS — C189 Malignant neoplasm of colon, unspecified: Secondary | ICD-10-CM | POA: Diagnosis not present

## 2023-03-15 DIAGNOSIS — Z853 Personal history of malignant neoplasm of breast: Secondary | ICD-10-CM | POA: Diagnosis not present

## 2023-03-15 DIAGNOSIS — Z471 Aftercare following joint replacement surgery: Secondary | ICD-10-CM | POA: Diagnosis not present

## 2023-03-15 DIAGNOSIS — M6281 Muscle weakness (generalized): Secondary | ICD-10-CM | POA: Diagnosis not present

## 2023-03-15 DIAGNOSIS — Z9181 History of falling: Secondary | ICD-10-CM | POA: Diagnosis not present

## 2023-03-15 DIAGNOSIS — E039 Hypothyroidism, unspecified: Secondary | ICD-10-CM | POA: Diagnosis not present

## 2023-03-15 DIAGNOSIS — E876 Hypokalemia: Secondary | ICD-10-CM | POA: Diagnosis not present

## 2023-03-15 DIAGNOSIS — E871 Hypo-osmolality and hyponatremia: Secondary | ICD-10-CM | POA: Diagnosis not present

## 2023-03-15 DIAGNOSIS — M150 Primary generalized (osteo)arthritis: Secondary | ICD-10-CM | POA: Diagnosis not present

## 2023-03-15 DIAGNOSIS — I110 Hypertensive heart disease with heart failure: Secondary | ICD-10-CM | POA: Diagnosis not present

## 2023-03-19 ENCOUNTER — Ambulatory Visit: Payer: Medicare Other | Admitting: Surgery

## 2023-03-25 DIAGNOSIS — I700 Atherosclerosis of aorta: Secondary | ICD-10-CM | POA: Diagnosis not present

## 2023-03-25 DIAGNOSIS — C189 Malignant neoplasm of colon, unspecified: Secondary | ICD-10-CM | POA: Diagnosis not present

## 2023-03-25 DIAGNOSIS — I5033 Acute on chronic diastolic (congestive) heart failure: Secondary | ICD-10-CM | POA: Diagnosis not present

## 2023-03-25 DIAGNOSIS — I110 Hypertensive heart disease with heart failure: Secondary | ICD-10-CM | POA: Diagnosis not present

## 2023-03-25 DIAGNOSIS — D630 Anemia in neoplastic disease: Secondary | ICD-10-CM | POA: Diagnosis not present

## 2023-03-25 DIAGNOSIS — S4291XD Fracture of right shoulder girdle, part unspecified, subsequent encounter for fracture with routine healing: Secondary | ICD-10-CM | POA: Diagnosis not present

## 2023-03-26 DIAGNOSIS — C189 Malignant neoplasm of colon, unspecified: Secondary | ICD-10-CM | POA: Diagnosis not present

## 2023-03-26 DIAGNOSIS — D630 Anemia in neoplastic disease: Secondary | ICD-10-CM | POA: Diagnosis not present

## 2023-03-26 DIAGNOSIS — I700 Atherosclerosis of aorta: Secondary | ICD-10-CM | POA: Diagnosis not present

## 2023-03-26 DIAGNOSIS — S4291XD Fracture of right shoulder girdle, part unspecified, subsequent encounter for fracture with routine healing: Secondary | ICD-10-CM | POA: Diagnosis not present

## 2023-03-26 DIAGNOSIS — I110 Hypertensive heart disease with heart failure: Secondary | ICD-10-CM | POA: Diagnosis not present

## 2023-03-26 DIAGNOSIS — I5033 Acute on chronic diastolic (congestive) heart failure: Secondary | ICD-10-CM | POA: Diagnosis not present

## 2023-03-28 DIAGNOSIS — D630 Anemia in neoplastic disease: Secondary | ICD-10-CM | POA: Diagnosis not present

## 2023-03-28 DIAGNOSIS — I700 Atherosclerosis of aorta: Secondary | ICD-10-CM | POA: Diagnosis not present

## 2023-03-28 DIAGNOSIS — I5033 Acute on chronic diastolic (congestive) heart failure: Secondary | ICD-10-CM | POA: Diagnosis not present

## 2023-03-28 DIAGNOSIS — I110 Hypertensive heart disease with heart failure: Secondary | ICD-10-CM | POA: Diagnosis not present

## 2023-03-28 DIAGNOSIS — C189 Malignant neoplasm of colon, unspecified: Secondary | ICD-10-CM | POA: Diagnosis not present

## 2023-03-28 DIAGNOSIS — S4291XD Fracture of right shoulder girdle, part unspecified, subsequent encounter for fracture with routine healing: Secondary | ICD-10-CM | POA: Diagnosis not present

## 2023-03-29 DIAGNOSIS — Z96611 Presence of right artificial shoulder joint: Secondary | ICD-10-CM | POA: Diagnosis not present

## 2023-04-02 DIAGNOSIS — I5033 Acute on chronic diastolic (congestive) heart failure: Secondary | ICD-10-CM | POA: Diagnosis not present

## 2023-04-02 DIAGNOSIS — I110 Hypertensive heart disease with heart failure: Secondary | ICD-10-CM | POA: Diagnosis not present

## 2023-04-02 DIAGNOSIS — D630 Anemia in neoplastic disease: Secondary | ICD-10-CM | POA: Diagnosis not present

## 2023-04-02 DIAGNOSIS — I700 Atherosclerosis of aorta: Secondary | ICD-10-CM | POA: Diagnosis not present

## 2023-04-02 DIAGNOSIS — S4291XD Fracture of right shoulder girdle, part unspecified, subsequent encounter for fracture with routine healing: Secondary | ICD-10-CM | POA: Diagnosis not present

## 2023-04-02 DIAGNOSIS — C189 Malignant neoplasm of colon, unspecified: Secondary | ICD-10-CM | POA: Diagnosis not present

## 2023-04-03 DIAGNOSIS — D630 Anemia in neoplastic disease: Secondary | ICD-10-CM | POA: Diagnosis not present

## 2023-04-03 DIAGNOSIS — I5033 Acute on chronic diastolic (congestive) heart failure: Secondary | ICD-10-CM | POA: Diagnosis not present

## 2023-04-03 DIAGNOSIS — I700 Atherosclerosis of aorta: Secondary | ICD-10-CM | POA: Diagnosis not present

## 2023-04-03 DIAGNOSIS — C189 Malignant neoplasm of colon, unspecified: Secondary | ICD-10-CM | POA: Diagnosis not present

## 2023-04-03 DIAGNOSIS — I110 Hypertensive heart disease with heart failure: Secondary | ICD-10-CM | POA: Diagnosis not present

## 2023-04-03 DIAGNOSIS — S4291XD Fracture of right shoulder girdle, part unspecified, subsequent encounter for fracture with routine healing: Secondary | ICD-10-CM | POA: Diagnosis not present

## 2023-04-04 DIAGNOSIS — D630 Anemia in neoplastic disease: Secondary | ICD-10-CM | POA: Diagnosis not present

## 2023-04-04 DIAGNOSIS — I700 Atherosclerosis of aorta: Secondary | ICD-10-CM | POA: Diagnosis not present

## 2023-04-04 DIAGNOSIS — C189 Malignant neoplasm of colon, unspecified: Secondary | ICD-10-CM | POA: Diagnosis not present

## 2023-04-04 DIAGNOSIS — I110 Hypertensive heart disease with heart failure: Secondary | ICD-10-CM | POA: Diagnosis not present

## 2023-04-04 DIAGNOSIS — I5033 Acute on chronic diastolic (congestive) heart failure: Secondary | ICD-10-CM | POA: Diagnosis not present

## 2023-04-04 DIAGNOSIS — S4291XD Fracture of right shoulder girdle, part unspecified, subsequent encounter for fracture with routine healing: Secondary | ICD-10-CM | POA: Diagnosis not present

## 2023-04-09 DIAGNOSIS — C189 Malignant neoplasm of colon, unspecified: Secondary | ICD-10-CM | POA: Diagnosis not present

## 2023-04-09 DIAGNOSIS — I110 Hypertensive heart disease with heart failure: Secondary | ICD-10-CM | POA: Diagnosis not present

## 2023-04-09 DIAGNOSIS — S4291XD Fracture of right shoulder girdle, part unspecified, subsequent encounter for fracture with routine healing: Secondary | ICD-10-CM | POA: Diagnosis not present

## 2023-04-09 DIAGNOSIS — D630 Anemia in neoplastic disease: Secondary | ICD-10-CM | POA: Diagnosis not present

## 2023-04-09 DIAGNOSIS — I700 Atherosclerosis of aorta: Secondary | ICD-10-CM | POA: Diagnosis not present

## 2023-04-09 DIAGNOSIS — I5033 Acute on chronic diastolic (congestive) heart failure: Secondary | ICD-10-CM | POA: Diagnosis not present

## 2023-04-10 DIAGNOSIS — I442 Atrioventricular block, complete: Secondary | ICD-10-CM | POA: Diagnosis not present

## 2023-04-12 DIAGNOSIS — I700 Atherosclerosis of aorta: Secondary | ICD-10-CM | POA: Diagnosis not present

## 2023-04-12 DIAGNOSIS — D630 Anemia in neoplastic disease: Secondary | ICD-10-CM | POA: Diagnosis not present

## 2023-04-12 DIAGNOSIS — S4291XD Fracture of right shoulder girdle, part unspecified, subsequent encounter for fracture with routine healing: Secondary | ICD-10-CM | POA: Diagnosis not present

## 2023-04-12 DIAGNOSIS — I5033 Acute on chronic diastolic (congestive) heart failure: Secondary | ICD-10-CM | POA: Diagnosis not present

## 2023-04-12 DIAGNOSIS — C189 Malignant neoplasm of colon, unspecified: Secondary | ICD-10-CM | POA: Diagnosis not present

## 2023-04-12 DIAGNOSIS — I110 Hypertensive heart disease with heart failure: Secondary | ICD-10-CM | POA: Diagnosis not present

## 2023-04-14 DIAGNOSIS — E876 Hypokalemia: Secondary | ICD-10-CM | POA: Diagnosis not present

## 2023-04-14 DIAGNOSIS — E871 Hypo-osmolality and hyponatremia: Secondary | ICD-10-CM | POA: Diagnosis not present

## 2023-04-14 DIAGNOSIS — I110 Hypertensive heart disease with heart failure: Secondary | ICD-10-CM | POA: Diagnosis not present

## 2023-04-14 DIAGNOSIS — I700 Atherosclerosis of aorta: Secondary | ICD-10-CM | POA: Diagnosis not present

## 2023-04-14 DIAGNOSIS — C189 Malignant neoplasm of colon, unspecified: Secondary | ICD-10-CM | POA: Diagnosis not present

## 2023-04-14 DIAGNOSIS — S4291XD Fracture of right shoulder girdle, part unspecified, subsequent encounter for fracture with routine healing: Secondary | ICD-10-CM | POA: Diagnosis not present

## 2023-04-14 DIAGNOSIS — D630 Anemia in neoplastic disease: Secondary | ICD-10-CM | POA: Diagnosis not present

## 2023-04-14 DIAGNOSIS — E039 Hypothyroidism, unspecified: Secondary | ICD-10-CM | POA: Diagnosis not present

## 2023-04-14 DIAGNOSIS — M6281 Muscle weakness (generalized): Secondary | ICD-10-CM | POA: Diagnosis not present

## 2023-04-14 DIAGNOSIS — I5033 Acute on chronic diastolic (congestive) heart failure: Secondary | ICD-10-CM | POA: Diagnosis not present

## 2023-04-14 DIAGNOSIS — M150 Primary generalized (osteo)arthritis: Secondary | ICD-10-CM | POA: Diagnosis not present

## 2023-04-14 DIAGNOSIS — Z471 Aftercare following joint replacement surgery: Secondary | ICD-10-CM | POA: Diagnosis not present

## 2023-04-14 DIAGNOSIS — Z7982 Long term (current) use of aspirin: Secondary | ICD-10-CM | POA: Diagnosis not present

## 2023-04-14 DIAGNOSIS — Z853 Personal history of malignant neoplasm of breast: Secondary | ICD-10-CM | POA: Diagnosis not present

## 2023-04-14 DIAGNOSIS — Z9181 History of falling: Secondary | ICD-10-CM | POA: Diagnosis not present

## 2023-04-16 DIAGNOSIS — C189 Malignant neoplasm of colon, unspecified: Secondary | ICD-10-CM | POA: Diagnosis not present

## 2023-04-16 DIAGNOSIS — D630 Anemia in neoplastic disease: Secondary | ICD-10-CM | POA: Diagnosis not present

## 2023-04-16 DIAGNOSIS — S4291XD Fracture of right shoulder girdle, part unspecified, subsequent encounter for fracture with routine healing: Secondary | ICD-10-CM | POA: Diagnosis not present

## 2023-04-16 DIAGNOSIS — I700 Atherosclerosis of aorta: Secondary | ICD-10-CM | POA: Diagnosis not present

## 2023-04-16 DIAGNOSIS — I5033 Acute on chronic diastolic (congestive) heart failure: Secondary | ICD-10-CM | POA: Diagnosis not present

## 2023-04-16 DIAGNOSIS — I110 Hypertensive heart disease with heart failure: Secondary | ICD-10-CM | POA: Diagnosis not present

## 2023-04-18 DIAGNOSIS — S4291XD Fracture of right shoulder girdle, part unspecified, subsequent encounter for fracture with routine healing: Secondary | ICD-10-CM | POA: Diagnosis not present

## 2023-04-18 DIAGNOSIS — D630 Anemia in neoplastic disease: Secondary | ICD-10-CM | POA: Diagnosis not present

## 2023-04-18 DIAGNOSIS — I110 Hypertensive heart disease with heart failure: Secondary | ICD-10-CM | POA: Diagnosis not present

## 2023-04-18 DIAGNOSIS — C189 Malignant neoplasm of colon, unspecified: Secondary | ICD-10-CM | POA: Diagnosis not present

## 2023-04-18 DIAGNOSIS — I5033 Acute on chronic diastolic (congestive) heart failure: Secondary | ICD-10-CM | POA: Diagnosis not present

## 2023-04-18 DIAGNOSIS — I700 Atherosclerosis of aorta: Secondary | ICD-10-CM | POA: Diagnosis not present

## 2023-04-19 DIAGNOSIS — M25611 Stiffness of right shoulder, not elsewhere classified: Secondary | ICD-10-CM | POA: Diagnosis not present

## 2023-04-19 DIAGNOSIS — Z96611 Presence of right artificial shoulder joint: Secondary | ICD-10-CM | POA: Diagnosis not present

## 2023-04-22 DIAGNOSIS — M25611 Stiffness of right shoulder, not elsewhere classified: Secondary | ICD-10-CM | POA: Diagnosis not present

## 2023-04-22 DIAGNOSIS — Z96611 Presence of right artificial shoulder joint: Secondary | ICD-10-CM | POA: Diagnosis not present

## 2023-04-25 DIAGNOSIS — Z96611 Presence of right artificial shoulder joint: Secondary | ICD-10-CM | POA: Diagnosis not present

## 2023-04-25 DIAGNOSIS — M25611 Stiffness of right shoulder, not elsewhere classified: Secondary | ICD-10-CM | POA: Diagnosis not present

## 2023-04-30 DIAGNOSIS — Z96611 Presence of right artificial shoulder joint: Secondary | ICD-10-CM | POA: Diagnosis not present

## 2023-05-02 DIAGNOSIS — Z96611 Presence of right artificial shoulder joint: Secondary | ICD-10-CM | POA: Diagnosis not present

## 2023-05-02 DIAGNOSIS — M25611 Stiffness of right shoulder, not elsewhere classified: Secondary | ICD-10-CM | POA: Diagnosis not present

## 2023-05-06 DIAGNOSIS — Z96611 Presence of right artificial shoulder joint: Secondary | ICD-10-CM | POA: Diagnosis not present

## 2023-05-06 DIAGNOSIS — M25611 Stiffness of right shoulder, not elsewhere classified: Secondary | ICD-10-CM | POA: Diagnosis not present

## 2023-05-08 ENCOUNTER — Ambulatory Visit (INDEPENDENT_AMBULATORY_CARE_PROVIDER_SITE_OTHER): Payer: Medicare Other | Admitting: Family Medicine

## 2023-05-08 ENCOUNTER — Encounter: Payer: Self-pay | Admitting: Family Medicine

## 2023-05-08 VITALS — BP 134/85 | HR 84 | Temp 97.9°F | Resp 15 | Wt 191.6 lb

## 2023-05-08 DIAGNOSIS — I10 Essential (primary) hypertension: Secondary | ICD-10-CM

## 2023-05-08 DIAGNOSIS — I700 Atherosclerosis of aorta: Secondary | ICD-10-CM | POA: Diagnosis not present

## 2023-05-08 DIAGNOSIS — E0590 Thyrotoxicosis, unspecified without thyrotoxic crisis or storm: Secondary | ICD-10-CM | POA: Diagnosis not present

## 2023-05-08 DIAGNOSIS — E7800 Pure hypercholesterolemia, unspecified: Secondary | ICD-10-CM | POA: Diagnosis not present

## 2023-05-08 DIAGNOSIS — D500 Iron deficiency anemia secondary to blood loss (chronic): Secondary | ICD-10-CM

## 2023-05-08 DIAGNOSIS — R600 Localized edema: Secondary | ICD-10-CM | POA: Diagnosis not present

## 2023-05-08 MED ORDER — BENAZEPRIL HCL 20 MG PO TABS: 20.0000 mg | ORAL_TABLET | ORAL | 1 refills | Status: DC

## 2023-05-08 MED ORDER — TRAZODONE HCL 50 MG PO TABS: 25.0000 mg | ORAL_TABLET | Freq: Every evening | ORAL | 1 refills | Status: DC | PRN

## 2023-05-08 MED ORDER — AMLODIPINE BESYLATE 5 MG PO TABS
5.0000 mg | ORAL_TABLET | ORAL | 1 refills | Status: DC
Start: 2023-05-08 — End: 2023-11-14

## 2023-05-08 MED ORDER — METOPROLOL SUCCINATE ER 50 MG PO TB24
ORAL_TABLET | ORAL | 1 refills | Status: DC
Start: 2023-05-08 — End: 2023-11-26

## 2023-05-08 MED ORDER — HYDROCHLOROTHIAZIDE 25 MG PO TABS: 25.0000 mg | ORAL_TABLET | Freq: Every day | ORAL | 0 refills | Status: DC

## 2023-05-08 NOTE — Assessment & Plan Note (Signed)
BP doing well, but still quite swollen. Will increase her hydrochlorothiazide to 25mg  and recheck in 6 weeks. Call with any concerns.

## 2023-05-08 NOTE — Assessment & Plan Note (Signed)
Will keep BP and cholesterol under good control. Continue to monitor. Call with any concerns.  

## 2023-05-08 NOTE — Progress Notes (Signed)
BP 134/85 (BP Location: Left Arm, Patient Position: Sitting, Cuff Size: Normal)   Pulse 84   Temp 97.9 F (36.6 C) (Oral)   Resp 15   Wt 191 lb 9.6 oz (86.9 kg)   SpO2 97%   BMI 33.94 kg/m    Subjective:    Patient ID: Maria Davies, female    DOB: Jun 17, 1941, 82 y.o.   MRN: [id number]  HPI: Maria Davies is a 82 y.o. female  Chief Complaint  Patient presents with   Hypertension   Aortic atherosclerosis   Hyperlipidemia   Hyperthyroidism   Anemia   Hand Pain    Ongoing from surgery but in PT. 2 times a week.    HYPERTENSION / HYPERLIPIDEMIA Satisfied with current treatment? yes Duration of hypertension: chronic BP monitoring frequency: not checking BP medication side effects: no Past BP meds: benazepril, metoprolol, hydrochlorothiazide, amlodpidine Duration of hyperlipidemia: chronic Cholesterol medication side effects: N/A Cholesterol supplements: none Past cholesterol medications: none Medication compliance: excellent compliance Aspirin: yes Recent stressors: no Recurrent headaches: no Visual changes: no Palpitations: no Dyspnea: no Chest pain: no Lower extremity edema: yes Dizzy/lightheaded: no  HYPERTHYROIDISM Thyroid control status: recent dose adjustment in August Satisfied with current treatment? yes Medication side effects: no Medication compliance: excellent compliance Fatigue: no Cold intolerance: no Heat intolerance: no Weight gain: no Weight loss: no Constipation: no Diarrhea/loose stools: no Palpitations: no Lower extremity edema: yes Anxiety/depressed mood: no   Relevant past medical, surgical, family and social history reviewed and updated as indicated. Interim medical history since our last visit reviewed. Allergies and medications reviewed and updated.  Review of Systems  Constitutional: Negative.   Respiratory: Negative.    Cardiovascular:  Positive for leg swelling. Negative for chest pain and palpitations.   Gastrointestinal: Negative.   Musculoskeletal: Negative.   Neurological: Negative.   Psychiatric/Behavioral: Negative.      Per HPI unless specifically indicated above     Objective:    BP 134/85 (BP Location: Left Arm, Patient Position: Sitting, Cuff Size: Normal)   Pulse 84   Temp 97.9 F (36.6 C) (Oral)   Resp 15   Wt 191 lb 9.6 oz (86.9 kg)   SpO2 97%   BMI 33.94 kg/m   Wt Readings from Last 3 Encounters:  05/08/23 191 lb 9.6 oz (86.9 kg)  02/14/23 203 lb 14.8 oz (92.5 kg)  12/10/22 191 lb 3.2 oz (86.7 kg)    Physical Exam Vitals and nursing note reviewed.  Constitutional:      General: She is not in acute distress.    Appearance: Normal appearance. She is not ill-appearing, toxic-appearing or diaphoretic.  HENT:     Head: Normocephalic and atraumatic.     Right Ear: External ear normal.     Left Ear: External ear normal.     Nose: Nose normal.     Mouth/Throat:     Mouth: Mucous membranes are moist.     Pharynx: Oropharynx is clear.  Eyes:     General: No scleral icterus.       Right eye: No discharge.        Left eye: No discharge.     Extraocular Movements: Extraocular movements intact.     Conjunctiva/sclera: Conjunctivae normal.     Pupils: Pupils are equal, round, and reactive to light.  Cardiovascular:     Rate and Rhythm: Normal rate and regular rhythm.     Pulses: Normal pulses.     Heart sounds: Normal heart sounds.  No murmur heard.    No friction rub. No gallop.  Pulmonary:     Effort: Pulmonary effort is normal. No respiratory distress.     Breath sounds: Normal breath sounds. No stridor. No wheezing, rhonchi or rales.  Chest:     Chest wall: No tenderness.  Musculoskeletal:        General: Normal range of motion.     Cervical back: Normal range of motion and neck supple.     Right lower leg: Edema present.     Left lower leg: Edema present.  Skin:    General: Skin is warm and dry.     Capillary Refill: Capillary refill takes less than 2  seconds.     Coloration: Skin is not jaundiced or pale.     Findings: No bruising, erythema, lesion or rash.  Neurological:     General: No focal deficit present.     Mental Status: She is alert and oriented to person, place, and time. Mental status is at baseline.  Psychiatric:        Mood and Affect: Mood normal.        Behavior: Behavior normal.        Thought Content: Thought content normal.        Judgment: Judgment normal.     Results for orders placed or performed during the hospital encounter of 02/07/23  CBC with Differential  Result Value Ref Range   WBC 9.5 4.0 - 10.5 K/uL   RBC 3.48 (L) 3.87 - 5.11 MIL/uL   Hemoglobin 12.8 12.0 - 15.0 g/dL   HCT 91.4 (L) 78.2 - 95.6 %   MCV 99.1 80.0 - 100.0 fL   MCH 36.8 (H) 26.0 - 34.0 pg   MCHC 37.1 (H) 30.0 - 36.0 g/dL   RDW 21.3 08.6 - 57.8 %   Platelets 155 150 - 400 K/uL   nRBC 0.0 0.0 - 0.2 %   Neutrophils Relative % 74 %   Neutro Abs 7.1 1.7 - 7.7 K/uL   Lymphocytes Relative 13 %   Lymphs Abs 1.3 0.7 - 4.0 K/uL   Monocytes Relative 10 %   Monocytes Absolute 0.9 0.1 - 1.0 K/uL   Eosinophils Relative 1 %   Eosinophils Absolute 0.1 0.0 - 0.5 K/uL   Basophils Relative 1 %   Basophils Absolute 0.1 0.0 - 0.1 K/uL   Immature Granulocytes 1 %   Abs Immature Granulocytes 0.09 (H) 0.00 - 0.07 K/uL  Comprehensive metabolic panel  Result Value Ref Range   Sodium 132 (L) 135 - 145 mmol/L   Potassium 2.3 (LL) 3.5 - 5.1 mmol/L   Chloride 92 (L) 98 - 111 mmol/L   CO2 24 22 - 32 mmol/L   Glucose, Bld 177 (H) 70 - 99 mg/dL   BUN 13 8 - 23 mg/dL   Creatinine, Ser 4.69 0.44 - 1.00 mg/dL   Calcium 8.6 (L) 8.9 - 10.3 mg/dL   Total Protein 6.4 (L) 6.5 - 8.1 g/dL   Albumin 3.6 3.5 - 5.0 g/dL   AST 32 15 - 41 U/L   ALT 22 0 - 44 U/L   Alkaline Phosphatase 68 38 - 126 U/L   Total Bilirubin 1.3 (H) 0.3 - 1.2 mg/dL   GFR, Estimated >62 >95 mL/min   Anion gap 16 (H) 5 - 15  Magnesium  Result Value Ref Range   Magnesium 1.8 1.7 - 2.4  mg/dL  Phosphorus  Result Value Ref Range   Phosphorus 2.7 2.5 -  4.6 mg/dL  Brain natriuretic peptide  Result Value Ref Range   B Natriuretic Peptide 63.5 0.0 - 100.0 pg/mL  Protime-INR  Result Value Ref Range   Prothrombin Time 13.6 11.4 - 15.2 seconds   INR 1.0 0.8 - 1.2  APTT  Result Value Ref Range   aPTT 26 24 - 36 seconds  Basic metabolic panel  Result Value Ref Range   Sodium 131 (L) 135 - 145 mmol/L   Potassium 3.7 3.5 - 5.1 mmol/L   Chloride 94 (L) 98 - 111 mmol/L   CO2 27 22 - 32 mmol/L   Glucose, Bld 135 (H) 70 - 99 mg/dL   BUN 11 8 - 23 mg/dL   Creatinine, Ser 1.30 (L) 0.44 - 1.00 mg/dL   Calcium 8.4 (L) 8.9 - 10.3 mg/dL   GFR, Estimated >86 >57 mL/min   Anion gap 10 5 - 15  CBC  Result Value Ref Range   WBC 11.0 (H) 4.0 - 10.5 K/uL   RBC 3.22 (L) 3.87 - 5.11 MIL/uL   Hemoglobin 11.6 (L) 12.0 - 15.0 g/dL   HCT 84.6 (L) 96.2 - 95.2 %   MCV 99.7 80.0 - 100.0 fL   MCH 36.0 (H) 26.0 - 34.0 pg   MCHC 36.1 (H) 30.0 - 36.0 g/dL   RDW 84.1 32.4 - 40.1 %   Platelets 144 (L) 150 - 400 K/uL   nRBC 0.0 0.0 - 0.2 %  Osmolality  Result Value Ref Range   Osmolality 272 (L) 275 - 295 mOsm/kg  Osmolality, urine  Result Value Ref Range   Osmolality, Ur 228 (L) 300 - 900 mOsm/kg  Sodium, urine, random  Result Value Ref Range   Sodium, Ur 26 mmol/L  Basic metabolic panel  Result Value Ref Range   Sodium 132 (L) 135 - 145 mmol/L   Potassium 3.5 3.5 - 5.1 mmol/L   Chloride 94 (L) 98 - 111 mmol/L   CO2 30 22 - 32 mmol/L   Glucose, Bld 115 (H) 70 - 99 mg/dL   BUN 8 8 - 23 mg/dL   Creatinine, Ser 0.27 (L) 0.44 - 1.00 mg/dL   Calcium 8.6 (L) 8.9 - 10.3 mg/dL   GFR, Estimated >25 >36 mL/min   Anion gap 8 5 - 15  CBC  Result Value Ref Range   WBC 8.7 4.0 - 10.5 K/uL   RBC 3.00 (L) 3.87 - 5.11 MIL/uL   Hemoglobin 11.0 (L) 12.0 - 15.0 g/dL   HCT 64.4 (L) 03.4 - 74.2 %   MCV 104.3 (H) 80.0 - 100.0 fL   MCH 36.7 (H) 26.0 - 34.0 pg   MCHC 35.1 30.0 - 36.0 g/dL   RDW  59.5 63.8 - 75.6 %   Platelets 118 (L) 150 - 400 K/uL   nRBC 0.0 0.0 - 0.2 %  CBC  Result Value Ref Range   WBC 9.6 4.0 - 10.5 K/uL   RBC 2.94 (L) 3.87 - 5.11 MIL/uL   Hemoglobin 10.8 (L) 12.0 - 15.0 g/dL   HCT 43.3 (L) 29.5 - 18.8 %   MCV 101.0 (H) 80.0 - 100.0 fL   MCH 36.7 (H) 26.0 - 34.0 pg   MCHC 36.4 (H) 30.0 - 36.0 g/dL   RDW 41.6 60.6 - 30.1 %   Platelets 140 (L) 150 - 400 K/uL   nRBC 0.0 0.0 - 0.2 %  Basic metabolic panel  Result Value Ref Range   Sodium 128 (L) 135 - 145 mmol/L  Potassium 3.3 (L) 3.5 - 5.1 mmol/L   Chloride 93 (L) 98 - 111 mmol/L   CO2 28 22 - 32 mmol/L   Glucose, Bld 105 (H) 70 - 99 mg/dL   BUN 9 8 - 23 mg/dL   Creatinine, Ser 8.11 (L) 0.44 - 1.00 mg/dL   Calcium 8.5 (L) 8.9 - 10.3 mg/dL   GFR, Estimated >91 >47 mL/min   Anion gap 7 5 - 15  Magnesium  Result Value Ref Range   Magnesium 1.9 1.7 - 2.4 mg/dL  Basic metabolic panel  Result Value Ref Range   Sodium 130 (L) 135 - 145 mmol/L   Potassium 4.0 3.5 - 5.1 mmol/L   Chloride 94 (L) 98 - 111 mmol/L   CO2 27 22 - 32 mmol/L   Glucose, Bld 108 (H) 70 - 99 mg/dL   BUN 7 (L) 8 - 23 mg/dL   Creatinine, Ser 8.29 (L) 0.44 - 1.00 mg/dL   Calcium 8.6 (L) 8.9 - 10.3 mg/dL   GFR, Estimated >56 >21 mL/min   Anion gap 9 5 - 15  Basic metabolic panel  Result Value Ref Range   Sodium 135 135 - 145 mmol/L   Potassium 4.5 3.5 - 5.1 mmol/L   Chloride 97 (L) 98 - 111 mmol/L   CO2 28 22 - 32 mmol/L   Glucose, Bld 113 (H) 70 - 99 mg/dL   BUN 9 8 - 23 mg/dL   Creatinine, Ser 3.08 (L) 0.44 - 1.00 mg/dL   Calcium 8.9 8.9 - 65.7 mg/dL   GFR, Estimated >84 >69 mL/min   Anion gap 10 5 - 15  TSH  Result Value Ref Range   TSH 1.684 0.350 - 4.500 uIU/mL  Cortisol-am, blood  Result Value Ref Range   Cortisol - AM 11.4 6.7 - 22.6 ug/dL  Basic metabolic panel  Result Value Ref Range   Sodium 135 135 - 145 mmol/L   Potassium 3.4 (L) 3.5 - 5.1 mmol/L   Chloride 99 98 - 111 mmol/L   CO2 29 22 - 32 mmol/L    Glucose, Bld 107 (H) 70 - 99 mg/dL   BUN 6 (L) 8 - 23 mg/dL   Creatinine, Ser 6.29 (L) 0.44 - 1.00 mg/dL   Calcium 8.8 (L) 8.9 - 10.3 mg/dL   GFR, Estimated >52 >84 mL/min   Anion gap 7 5 - 15  Magnesium  Result Value Ref Range   Magnesium 2.0 1.7 - 2.4 mg/dL  CBC  Result Value Ref Range   WBC 9.6 4.0 - 10.5 K/uL   RBC 2.75 (L) 3.87 - 5.11 MIL/uL   Hemoglobin 10.1 (L) 12.0 - 15.0 g/dL   HCT 13.2 (L) 44.0 - 10.2 %   MCV 102.5 (H) 80.0 - 100.0 fL   MCH 36.7 (H) 26.0 - 34.0 pg   MCHC 35.8 30.0 - 36.0 g/dL   RDW 72.5 36.6 - 44.0 %   Platelets 178 150 - 400 K/uL   nRBC 0.0 0.0 - 0.2 %  Basic metabolic panel  Result Value Ref Range   Sodium 133 (L) 135 - 145 mmol/L   Potassium 3.7 3.5 - 5.1 mmol/L   Chloride 97 (L) 98 - 111 mmol/L   CO2 28 22 - 32 mmol/L   Glucose, Bld 109 (H) 70 - 99 mg/dL   BUN 11 8 - 23 mg/dL   Creatinine, Ser 3.47 0.44 - 1.00 mg/dL   Calcium 8.3 (L) 8.9 - 10.3 mg/dL   GFR, Estimated >42 >  60 mL/min   Anion gap 8 5 - 15  CBC  Result Value Ref Range   WBC 8.9 4.0 - 10.5 K/uL   RBC 2.65 (L) 3.87 - 5.11 MIL/uL   Hemoglobin 9.5 (L) 12.0 - 15.0 g/dL   HCT 16.1 (L) 09.6 - 04.5 %   MCV 104.2 (H) 80.0 - 100.0 fL   MCH 35.8 (H) 26.0 - 34.0 pg   MCHC 34.4 30.0 - 36.0 g/dL   RDW 40.9 81.1 - 91.4 %   Platelets 206 150 - 400 K/uL   nRBC 0.0 0.0 - 0.2 %  Basic metabolic panel  Result Value Ref Range   Sodium 134 (L) 135 - 145 mmol/L   Potassium 3.4 (L) 3.5 - 5.1 mmol/L   Chloride 100 98 - 111 mmol/L   CO2 30 22 - 32 mmol/L   Glucose, Bld 111 (H) 70 - 99 mg/dL   BUN 10 8 - 23 mg/dL   Creatinine, Ser 7.82 0.44 - 1.00 mg/dL   Calcium 8.2 (L) 8.9 - 10.3 mg/dL   GFR, Estimated >95 >62 mL/min   Anion gap 4 (L) 5 - 15  ECHOCARDIOGRAM COMPLETE  Result Value Ref Range   Weight 3,160.51 oz   Height 63 in   BP 118/59 mmHg   Ao pk vel 2.69 m/s   AV Area VTI 1.18 cm2   AR max vel 1.18 cm2   AV Mean grad 16.5 mmHg   AV Peak grad 29.0 mmHg   S' Lateral 2.80 cm   AV  Area mean vel 1.17 cm2   Area-P 1/2 4.46 cm2   MV VTI 2.36 cm2   Est EF 60 - 65%   Type and screen [facility]  Result Value Ref Range   ABO/RH(D) O NEG    Antibody Screen NEG    Sample Expiration      02/10/2023,2359 Performed at [facility], [address] [place], [place] [address]       Assessment & Plan:   Problem List Items Addressed This Visit       Cardiovascular and Mediastinum   Hypertension    BP doing well, but still quite swollen. Will increase her hydrochlorothiazide to 25mg  and recheck in 6 weeks. Call with any concerns.       Relevant Medications   amLODipine (NORVASC) 5 MG tablet   benazepril (LOTENSIN) 20 MG tablet   metoprolol succinate (TOPROL-XL) 50 MG 24 hr tablet   hydrochlorothiazide (HYDRODIURIL) 25 MG tablet   Other Relevant Orders   CBC with Differential/Platelet   Comprehensive metabolic panel   Aortic atherosclerosis (HCC) - Primary    Will keep BP and cholesterol under good control. Continue to monitor. Call with any concerns.       Relevant Medications   amLODipine (NORVASC) 5 MG tablet   benazepril (LOTENSIN) 20 MG tablet   metoprolol succinate (TOPROL-XL) 50 MG 24 hr tablet   hydrochlorothiazide (HYDRODIURIL) 25 MG tablet   Other Relevant Orders   CBC with Differential/Platelet   Comprehensive metabolic panel     Endocrine   Hyperthyroidism    Continues to follow with endocrinology. Due for labs in 3 weeks- will check them today to save her a stick and send them to endocrine. Call with any concerns.       Relevant Medications   metoprolol succinate (TOPROL-XL) 50 MG 24 hr tablet   Other Relevant Orders   CBC with Differential/Platelet   Comprehensive metabolic panel   TSH  T4     Other   Hypercholesteremia    Rechecking labs today. Await results. Treat as needed.       Relevant Medications   amLODipine (NORVASC) 5 MG tablet   benazepril (LOTENSIN) 20 MG tablet   metoprolol succinate  (TOPROL-XL) 50 MG 24 hr tablet   hydrochlorothiazide (HYDRODIURIL) 25 MG tablet   Other Relevant Orders   CBC with Differential/Platelet   Comprehensive metabolic panel   Lipid Panel w/o Chol/HDL Ratio   Iron deficiency anemia secondary to blood loss (chronic)    Rechecking labs today. Await results. Treat as needed.      Relevant Orders   CBC with Differential/Platelet   Comprehensive metabolic panel   Other Visit Diagnoses     Peripheral edema       Will increase her HCTZ to 25mg  and rehceck in 6 weeks.        Follow up plan: Return in about 6 weeks (around 06/19/2023).

## 2023-05-08 NOTE — Assessment & Plan Note (Signed)
Rechecking labs today. Await results. Treat as needed.  °

## 2023-05-08 NOTE — Assessment & Plan Note (Signed)
Continues to follow with endocrinology. Due for labs in 3 weeks- will check them today to save her a stick and send them to endocrine. Call with any concerns.

## 2023-05-09 ENCOUNTER — Other Ambulatory Visit: Payer: Medicare Other

## 2023-05-09 DIAGNOSIS — M25611 Stiffness of right shoulder, not elsewhere classified: Secondary | ICD-10-CM | POA: Diagnosis not present

## 2023-05-09 DIAGNOSIS — Z96611 Presence of right artificial shoulder joint: Secondary | ICD-10-CM | POA: Diagnosis not present

## 2023-05-09 LAB — LIPID PANEL W/O CHOL/HDL RATIO
Cholesterol, Total: 215 mg/dL — ABNORMAL HIGH (ref 100–199)
HDL: 107 mg/dL (ref >39)
LDL Chol Calc (NIH): 96 mg/dL (ref 0–99)
Triglycerides: 66 mg/dL (ref 0–149)
VLDL Cholesterol Cal: 12 mg/dL (ref 5–40)

## 2023-05-09 LAB — COMPREHENSIVE METABOLIC PANEL
ALT: 8 [IU]/L (ref 0–32)
AST: 15 [IU]/L (ref 0–40)
Albumin: 4.3 g/dL (ref 3.8–4.8)
Alkaline Phosphatase: 87 [IU]/L (ref 44–121)
BUN/Creatinine Ratio: 19 (ref 12–28)
BUN: 10 mg/dL (ref 8–27)
Bilirubin Total: 0.6 mg/dL (ref 0.0–1.2)
CO2: 28 mmol/L (ref 20–29)
Calcium: 9.4 mg/dL (ref 8.7–10.3)
Chloride: 95 mmol/L — ABNORMAL LOW (ref 96–106)
Creatinine, Ser: 0.53 mg/dL — ABNORMAL LOW (ref 0.57–1.00)
Globulin, Total: 2.3 g/dL (ref 1.5–4.5)
Glucose: 103 mg/dL — ABNORMAL HIGH (ref 70–99)
Potassium: 3.6 mmol/L (ref 3.5–5.2)
Sodium: 137 mmol/L (ref 134–144)
Total Protein: 6.6 g/dL (ref 6.0–8.5)
eGFR: 93 mL/min/{1.73_m2} (ref >59)

## 2023-05-09 LAB — CBC WITH DIFFERENTIAL/PLATELET
Basophils Absolute: 0.1 10*3/uL (ref 0.0–0.2)
Basos: 1 %
EOS (ABSOLUTE): 0.2 10*3/uL (ref 0.0–0.4)
Eos: 3 %
Hematocrit: 40.8 % (ref 34.0–46.6)
Hemoglobin: 13.8 g/dL (ref 11.1–15.9)
Immature Grans (Abs): 0 10*3/uL (ref 0.0–0.1)
Immature Granulocytes: 0 %
Lymphocytes Absolute: 1.4 10*3/uL (ref 0.7–3.1)
Lymphs: 24 %
MCH: 32.5 pg (ref 26.6–33.0)
MCHC: 33.8 g/dL (ref 31.5–35.7)
MCV: 96 fL (ref 79–97)
Monocytes Absolute: 0.7 10*3/uL (ref 0.1–0.9)
Monocytes: 11 %
Neutrophils Absolute: 3.7 10*3/uL (ref 1.4–7.0)
Neutrophils: 61 %
Platelets: 189 10*3/uL (ref 150–450)
RBC: 4.24 x10E6/uL (ref 3.77–5.28)
RDW: 13 % (ref 11.7–15.4)
WBC: 6 10*3/uL (ref 3.4–10.8)

## 2023-05-09 LAB — TSH: TSH: 0.099 u[IU]/mL — ABNORMAL LOW (ref 0.450–4.500)

## 2023-05-09 LAB — T4: T4, Total: 7.3 ug/dL (ref 4.5–12.0)

## 2023-05-10 ENCOUNTER — Encounter: Payer: Self-pay | Admitting: Family Medicine

## 2023-05-13 ENCOUNTER — Encounter: Payer: Self-pay | Admitting: Family Medicine

## 2023-05-13 NOTE — Progress Notes (Signed)
Printed and mailed on 05/13/2023 also faxed to Dr. Tedd Sias at [facility] endocrinology.

## 2023-05-13 NOTE — Progress Notes (Signed)
Printed and mailed on 05/13/2023.

## 2023-05-14 DIAGNOSIS — S4291XD Fracture of right shoulder girdle, part unspecified, subsequent encounter for fracture with routine healing: Secondary | ICD-10-CM | POA: Diagnosis not present

## 2023-05-14 DIAGNOSIS — M25511 Pain in right shoulder: Secondary | ICD-10-CM | POA: Diagnosis not present

## 2023-05-14 DIAGNOSIS — Z96611 Presence of right artificial shoulder joint: Secondary | ICD-10-CM | POA: Diagnosis not present

## 2023-05-14 DIAGNOSIS — S62620D Displaced fracture of medial phalanx of right index finger, subsequent encounter for fracture with routine healing: Secondary | ICD-10-CM | POA: Diagnosis not present

## 2023-05-14 DIAGNOSIS — M25541 Pain in joints of right hand: Secondary | ICD-10-CM | POA: Diagnosis not present

## 2023-05-21 DIAGNOSIS — Z96611 Presence of right artificial shoulder joint: Secondary | ICD-10-CM | POA: Diagnosis not present

## 2023-05-21 DIAGNOSIS — M25511 Pain in right shoulder: Secondary | ICD-10-CM | POA: Diagnosis not present

## 2023-05-23 DIAGNOSIS — Z96611 Presence of right artificial shoulder joint: Secondary | ICD-10-CM | POA: Diagnosis not present

## 2023-05-23 DIAGNOSIS — M25511 Pain in right shoulder: Secondary | ICD-10-CM | POA: Diagnosis not present

## 2023-05-27 DIAGNOSIS — M25511 Pain in right shoulder: Secondary | ICD-10-CM | POA: Diagnosis not present

## 2023-05-27 DIAGNOSIS — Z96611 Presence of right artificial shoulder joint: Secondary | ICD-10-CM | POA: Diagnosis not present

## 2023-05-29 DIAGNOSIS — S62620D Displaced fracture of medial phalanx of right index finger, subsequent encounter for fracture with routine healing: Secondary | ICD-10-CM | POA: Diagnosis not present

## 2023-05-29 DIAGNOSIS — Z96611 Presence of right artificial shoulder joint: Secondary | ICD-10-CM | POA: Diagnosis not present

## 2023-05-29 DIAGNOSIS — S4291XD Fracture of right shoulder girdle, part unspecified, subsequent encounter for fracture with routine healing: Secondary | ICD-10-CM | POA: Diagnosis not present

## 2023-05-29 DIAGNOSIS — G5601 Carpal tunnel syndrome, right upper limb: Secondary | ICD-10-CM | POA: Diagnosis not present

## 2023-05-30 DIAGNOSIS — M25511 Pain in right shoulder: Secondary | ICD-10-CM | POA: Diagnosis not present

## 2023-05-30 DIAGNOSIS — Z96611 Presence of right artificial shoulder joint: Secondary | ICD-10-CM | POA: Diagnosis not present

## 2023-06-03 DIAGNOSIS — Z96611 Presence of right artificial shoulder joint: Secondary | ICD-10-CM | POA: Diagnosis not present

## 2023-06-03 DIAGNOSIS — M25511 Pain in right shoulder: Secondary | ICD-10-CM | POA: Diagnosis not present

## 2023-06-05 DIAGNOSIS — Z96611 Presence of right artificial shoulder joint: Secondary | ICD-10-CM | POA: Diagnosis not present

## 2023-06-05 DIAGNOSIS — M25511 Pain in right shoulder: Secondary | ICD-10-CM | POA: Diagnosis not present

## 2023-06-06 DIAGNOSIS — E0590 Thyrotoxicosis, unspecified without thyrotoxic crisis or storm: Secondary | ICD-10-CM | POA: Diagnosis not present

## 2023-06-13 DIAGNOSIS — M25511 Pain in right shoulder: Secondary | ICD-10-CM | POA: Diagnosis not present

## 2023-06-13 DIAGNOSIS — Z96611 Presence of right artificial shoulder joint: Secondary | ICD-10-CM | POA: Diagnosis not present

## 2023-06-20 DIAGNOSIS — Z96611 Presence of right artificial shoulder joint: Secondary | ICD-10-CM | POA: Diagnosis not present

## 2023-06-20 DIAGNOSIS — M25511 Pain in right shoulder: Secondary | ICD-10-CM | POA: Diagnosis not present

## 2023-06-24 ENCOUNTER — Ambulatory Visit: Payer: Medicare Other | Admitting: Family Medicine

## 2023-06-25 DIAGNOSIS — Z96611 Presence of right artificial shoulder joint: Secondary | ICD-10-CM | POA: Diagnosis not present

## 2023-06-25 DIAGNOSIS — M25511 Pain in right shoulder: Secondary | ICD-10-CM | POA: Diagnosis not present

## 2023-06-26 ENCOUNTER — Ambulatory Visit
Admission: RE | Admit: 2023-06-26 | Discharge: 2023-06-26 | Disposition: A | Discharge status: Home / Self Care | Payer: Medicare Other | Source: Ambulatory Visit | Attending: Oncology | Admitting: Oncology

## 2023-06-26 DIAGNOSIS — R935 Abnormal findings on diagnostic imaging of other abdominal regions, including retroperitoneum: Secondary | ICD-10-CM | POA: Diagnosis not present

## 2023-06-26 DIAGNOSIS — K429 Umbilical hernia without obstruction or gangrene: Secondary | ICD-10-CM | POA: Diagnosis not present

## 2023-06-26 DIAGNOSIS — C182 Malignant neoplasm of ascending colon: Secondary | ICD-10-CM

## 2023-06-26 DIAGNOSIS — K5730 Diverticulosis of large intestine without perforation or abscess without bleeding: Secondary | ICD-10-CM | POA: Diagnosis not present

## 2023-06-26 DIAGNOSIS — C189 Malignant neoplasm of colon, unspecified: Secondary | ICD-10-CM | POA: Diagnosis not present

## 2023-06-26 MED ORDER — BARIUM SULFATE 2 % PO SUSP
450.0000 mL | ORAL | Status: AC
  Administered 2023-06-26 (×2): 450 mL via ORAL

## 2023-06-26 MED ORDER — IOHEXOL 300 MG/ML  SOLN
85.0000 mL | Freq: Once | INTRAMUSCULAR | Status: AC | PRN
  Administered 2023-06-26: 85 mL via INTRAVENOUS

## 2023-06-27 DIAGNOSIS — M25511 Pain in right shoulder: Secondary | ICD-10-CM | POA: Diagnosis not present

## 2023-06-27 DIAGNOSIS — Z96611 Presence of right artificial shoulder joint: Secondary | ICD-10-CM | POA: Diagnosis not present

## 2023-06-28 DIAGNOSIS — S62620P Displaced fracture of medial phalanx of right index finger, subsequent encounter for fracture with malunion: Secondary | ICD-10-CM | POA: Diagnosis not present

## 2023-06-28 DIAGNOSIS — M79644 Pain in right finger(s): Secondary | ICD-10-CM | POA: Diagnosis not present

## 2023-06-28 DIAGNOSIS — G5601 Carpal tunnel syndrome, right upper limb: Secondary | ICD-10-CM | POA: Diagnosis not present

## 2023-07-02 ENCOUNTER — Other Ambulatory Visit: Payer: Self-pay | Admitting: *Deleted

## 2023-07-02 DIAGNOSIS — C182 Malignant neoplasm of ascending colon: Secondary | ICD-10-CM

## 2023-07-02 DIAGNOSIS — M25511 Pain in right shoulder: Secondary | ICD-10-CM | POA: Diagnosis not present

## 2023-07-02 DIAGNOSIS — Z96611 Presence of right artificial shoulder joint: Secondary | ICD-10-CM | POA: Diagnosis not present

## 2023-07-03 ENCOUNTER — Inpatient Hospital Stay: Payer: Medicare Other | Attending: Oncology

## 2023-07-03 ENCOUNTER — Encounter: Payer: Self-pay | Admitting: Oncology

## 2023-07-03 ENCOUNTER — Inpatient Hospital Stay (HOSPITAL_BASED_OUTPATIENT_CLINIC_OR_DEPARTMENT_OTHER): Payer: Medicare Other | Admitting: Oncology

## 2023-07-03 VITALS — BP 131/56 | HR 95 | Temp 97.5°F | Resp 18 | Ht 63.0 in | Wt 190.5 lb

## 2023-07-03 DIAGNOSIS — Z860102 Personal history of hyperplastic colon polyps: Secondary | ICD-10-CM | POA: Diagnosis not present

## 2023-07-03 DIAGNOSIS — C182 Malignant neoplasm of ascending colon: Secondary | ICD-10-CM | POA: Diagnosis not present

## 2023-07-03 DIAGNOSIS — E041 Nontoxic single thyroid nodule: Secondary | ICD-10-CM | POA: Diagnosis not present

## 2023-07-03 DIAGNOSIS — I700 Atherosclerosis of aorta: Secondary | ICD-10-CM | POA: Diagnosis not present

## 2023-07-03 DIAGNOSIS — Z79899 Other long term (current) drug therapy: Secondary | ICD-10-CM | POA: Diagnosis not present

## 2023-07-03 DIAGNOSIS — Z803 Family history of malignant neoplasm of breast: Secondary | ICD-10-CM | POA: Insufficient documentation

## 2023-07-03 DIAGNOSIS — I119 Hypertensive heart disease without heart failure: Secondary | ICD-10-CM | POA: Diagnosis not present

## 2023-07-03 DIAGNOSIS — M5030 Other cervical disc degeneration, unspecified cervical region: Secondary | ICD-10-CM | POA: Insufficient documentation

## 2023-07-03 DIAGNOSIS — Z85828 Personal history of other malignant neoplasm of skin: Secondary | ICD-10-CM | POA: Insufficient documentation

## 2023-07-03 DIAGNOSIS — I350 Nonrheumatic aortic (valve) stenosis: Secondary | ICD-10-CM | POA: Diagnosis not present

## 2023-07-03 DIAGNOSIS — I2510 Atherosclerotic heart disease of native coronary artery without angina pectoris: Secondary | ICD-10-CM | POA: Diagnosis not present

## 2023-07-03 DIAGNOSIS — Z7982 Long term (current) use of aspirin: Secondary | ICD-10-CM | POA: Diagnosis not present

## 2023-07-03 DIAGNOSIS — M8580 Other specified disorders of bone density and structure, unspecified site: Secondary | ICD-10-CM | POA: Insufficient documentation

## 2023-07-03 DIAGNOSIS — Z923 Personal history of irradiation: Secondary | ICD-10-CM | POA: Insufficient documentation

## 2023-07-03 DIAGNOSIS — Z853 Personal history of malignant neoplasm of breast: Secondary | ICD-10-CM | POA: Insufficient documentation

## 2023-07-03 DIAGNOSIS — E0590 Thyrotoxicosis, unspecified without thyrotoxic crisis or storm: Secondary | ICD-10-CM | POA: Insufficient documentation

## 2023-07-03 LAB — CBC WITH DIFFERENTIAL/PLATELET
Abs Immature Granulocytes: 0.04 10*3/uL (ref 0.00–0.07)
Basophils Absolute: 0 10*3/uL (ref 0.0–0.1)
Basophils Relative: 1 %
Eosinophils Absolute: 0.1 10*3/uL (ref 0.0–0.5)
Eosinophils Relative: 2 %
HCT: 39.8 % (ref 36.0–46.0)
Hemoglobin: 14.2 g/dL (ref 12.0–15.0)
Immature Granulocytes: 1 %
Lymphocytes Relative: 22 %
Lymphs Abs: 1.1 10*3/uL (ref 0.7–4.0)
MCH: 33.4 pg (ref 26.0–34.0)
MCHC: 35.7 g/dL (ref 30.0–36.0)
MCV: 93.6 fL (ref 80.0–100.0)
Monocytes Absolute: 0.8 10*3/uL (ref 0.1–1.0)
Monocytes Relative: 15 %
Neutro Abs: 3.2 10*3/uL (ref 1.7–7.7)
Neutrophils Relative %: 59 %
Platelets: 173 10*3/uL (ref 150–400)
RBC: 4.25 MIL/uL (ref 3.87–5.11)
RDW: 14 % (ref 11.5–15.5)
WBC: 5.3 10*3/uL (ref 4.0–10.5)
nRBC: 0 % (ref 0.0–0.2)

## 2023-07-03 NOTE — Progress Notes (Signed)
 [facility]  [phone] [phone]  ID: Maria Davies OB: 1941-04-19  MR#: [id number]  AOZ#:[id number]  Patient Care Team: Solomon Dupre, DO as PCP - General (Family Medicine) Flynn Hylan, MD as Consulting Physician (General Surgery) Shellie Dials, MD as Consulting Physician (Oncology)   CHIEF COMPLAINT: Stage IIIb right colon cancer.  INTERVAL HISTORY: Patient returns to clinic today for routine yearly evaluation and discussion of her imaging and laboratory results.  She continues to feel well and remains asymptomatic. She has no neurologic complaints.  She denies any recent fevers or illnesses.  She has no chest pain, shortness of breath, cough, or hemoptysis. She denies any vomiting, constipation, or diarrhea.  She has no melena or hematochezia.  She has no urinary complaints.  Patient offers no specific complaints today.  REVIEW OF SYSTEMS:   Review of Systems  Constitutional: Negative.  Negative for fever, malaise/fatigue and weight loss.  Respiratory: Negative.  Negative for cough, hemoptysis and shortness of breath.   Cardiovascular: Negative.  Negative for chest pain and leg swelling.  Gastrointestinal: Negative.  Negative for abdominal pain, blood in stool, melena and nausea.  Genitourinary: Negative.  Negative for hematuria.  Musculoskeletal: Negative.  Negative for back pain.  Skin: Negative.  Negative for rash.  Neurological: Negative.  Negative for dizziness, focal weakness, weakness and headaches.  Psychiatric/Behavioral: Negative.  The patient is not nervous/anxious.     As per HPI. Otherwise, a complete review of systems is negative.   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   a.) stage IIIb; b.) RIGHT colon mass Bx (+) for invasive moderately differentiated adenocarcinoma (pT3 pN1a)   Anemia    Aortic atherosclerosis (HCC)    Aortic stenosis     a.) TTE 08/17/2016: EF >55%, mild AS (MPG 29.5); b.) TTE 08/25/2018: EF >55%; mod AS (MPG 42.3); c.) TTE 06/17/2020: EF >55%; severe AS (MPG 47.2); d.) s/p TAVR 08/31/2020 at [facility] - 23 mm Edwards Sapien 3 Ultra valve placed via a RIGHT percutaneous femoral approach --> complicated by intra/postoperative CHB requiring TVP and ultimate PPM placement   Arthritis    Basal cell carcinoma (BCC) of back    Bilateral leg edema    Breast cancer, right (HCC) 2011   a.) stage Ia IMC (ER/PR +, HER2/neu -); b.) lumpectomy + XRT in 2011; c.) completed 5 years AI therapy (anastrozole) in 11/2014   Cervical spinal stenosis    Cervical spondylosis    Chemotherapy induced nausea and vomiting    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   a.) TVP placed intraoperatively during TAVR secondary to development of CHB; b.) Medtronic PPM placed 09/03/2020 for postoperative continued postoperative CHB   Coronary artery disease    a.) RHC 08/11/2020: no sig CAD   DDD (degenerative disc disease), cervical    Diastolic dysfunction    a.) TTE 08/17/2016: EF >55%, mild LVH, mild LAE, triv panvalvular regurg, G1DD; b.) TTE 08/25/2018: EF >55%, mod LVH, mild LAE, triv AR/TR/PR, mild MR, G1dd; c.) TTE 08/11/2020: EF >55%, mild LVHH, mod LAE, triv MR/TR   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   Heart murmur    History of bilateral cataract extraction    Hyperplastic colon polyp    Hypertension    Hyperthyroidism    Neuropathy    Osteopenia    Personal history of other malignant neoplasm of large intestine 06/18/2020   PONV (postoperative nausea and vomiting)  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   a.) TVP placed intraoperatively during TAVR secondary to development of CHB; b.) Medtronic PPM placed 09/03/2020 for postoperative continued postoperative CHB   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   a.) s/p TAVR 08/31/2020 at [facility] - 23 mm Edwards Sapien 3 Ultra valve placed via a RIGHT  percutaneous femoral approach --> complicated by intra/postoperative CHB requiring TVP and ultimate PPM placement   Thyroid  nodule 01/25/2022   a.) CT C-spine 01/25/2022: 22 mm right thyroid  nodule    PAST SURGICAL HISTORY: Past Surgical History:  Procedure Laterality Date   BACK SURGERY     BREAST EXCISIONAL BIOPSY Right 03/2009   BREAST EXCISIONAL BIOPSY Bilateral    NEG   CATARACT EXTRACTION W/ INTRAOCULAR LENS  IMPLANT, BILATERAL     CHOLECYSTECTOMY     COLONOSCOPY WITH PROPOFOL  N/A 10/19/2019   Procedure: COLONOSCOPY WITH PROPOFOL -attempted, unable to perform poor prep;  Surgeon: Marnee Sink, MD;  Location: [facility];  Service: Endoscopy;  Laterality: N/A;  priority 3   COLONOSCOPY WITH PROPOFOL  N/A 10/20/2019   Procedure: COLONOSCOPY WITH PROPOFOL ;  Surgeon: Marnee Sink, MD;  Location: [facility] ENDOSCOPY;  Service: Endoscopy;  Laterality: N/A;   COLONOSCOPY WITH PROPOFOL  N/A 11/16/2021   Procedure: COLONOSCOPY WITH PROPOFOL ;  Surgeon: Marnee Sink, MD;  Location: [facility] ENDOSCOPY;  Service: Endoscopy;  Laterality: N/A;   ESOPHAGEAL DILATION  10/19/2019   Procedure: ESOPHAGEAL DILATION;  Surgeon: Marnee Sink, MD;  Location: [facility];  Service: Endoscopy;;   ESOPHAGOGASTRODUODENOSCOPY (EGD) WITH PROPOFOL  N/A 10/19/2019   Procedure: ESOPHAGOGASTRODUODENOSCOPY (EGD) WITH PROPOFOL ;  Surgeon: Marnee Sink, MD;  Location: [facility];  Service: Endoscopy;  Laterality: N/A;   FLEXIBLE SIGMOIDOSCOPY N/A 03/20/2022   Procedure: FLEXIBLE SIGMOIDOSCOPY;  Surgeon: Marnee Sink, MD;  Location: [facility] ENDOSCOPY;  Service: Endoscopy;  Laterality: N/A;   HEMORRHOID SURGERY     MASTECTOMY, PARTIAL Right 2010   PACEMAKER INSERTION N/A 09/03/2020   Procedure: PACEMAKER WITH LEFT BUNDLE LEAD (MEDTRONIC); Location: [facility]; Surgeon: Lennette Quiver, MD   [facility] REMOVAL  2021   PORTACATH PLACEMENT Right 11/23/2019   Procedure: INSERTION PORT-A-CATH;  Surgeon: Flynn Hylan, MD;   Location: [facility] ORS;  Service: General;  Laterality: Right;   RECTAL EXAM UNDER ANESTHESIA N/A 07/18/2022   Procedure: RECTAL EXAM UNDER ANESTHESIA;  Surgeon: Flynn Hylan, MD;  Location: [facility] ORS;  Service: General;  Laterality: N/A;   REVERSE SHOULDER ARTHROPLASTY Right 02/12/2023   Procedure: REVERSE SHOULDER ARTHROPLASTY;  Surgeon: Elner Hahn, MD;  Location: [facility] ORS;  Service: Orthopedics;  Laterality: Right;   RIGHT COLECTOMY Right 10/2019   Procedure: ROBOTIC ASSISTED TIGHT HEMICOLECTOMY   RIGHT/LEFT HEART CATH AND CORONARY ANGIOGRAPHY Right 08/11/2020   Procedure: RIGHT/LEFT HEART CATH AND CORONARY ANGIOGRAPHY; Location: [facility]; Surgeon: Rockne Chyle, MD   TRANSCATHETER AORTIC VALVE REPLACEMENT, TRANSFEMORAL Right 08/31/2020   Procedure: TRANSCATHETER AORTIC VALVE REPLACEMENT, RIGHT TRANSFEMORAL; Location: [facility]; Surgeon: Forbes Ida, MD   TUMOR EXCISION N/A 04/11/2022   Procedure: TUMOR EXCISION RECTAL;  Surgeon: Flynn Hylan, MD;  Location: [facility] ORS;  Service: General;  Laterality: N/A;    FAMILY HISTORY: Family History  Problem Relation Age of Onset   Cancer Mother    Heart disease Father    Breast cancer Paternal Aunt 76    ADVANCED DIRECTIVES (Y/N):  N  HEALTH MAINTENANCE: Social History   Tobacco Use   Smoking status: Never    Passive exposure: Never   Smokeless tobacco: Never  Vaping Use   Vaping  status: Never Used  Substance Use Topics   Alcohol use: Not Currently    Alcohol/week: 21.0 standard drinks of alcohol    Types: 21 Shots of liquor per week   Drug use: No     Colonoscopy:  PAP:  Bone density:  Lipid panel:  Allergies  Allergen Reactions   Tape     Prefers Paper tape be used    Current Outpatient Medications  Medication Sig Dispense Refill   amLODipine  (NORVASC ) 5 MG tablet Take 1 tablet (5 mg total) by mouth every morning. 90 tablet 1   aspirin  EC 81 MG tablet Take 81 mg by mouth daily.     benazepril  (LOTENSIN ) 20 MG tablet Take  1 tablet (20 mg total) by mouth every morning. 90 tablet 1   bisacodyl  (DULCOLAX) 10 MG suppository Place 1 suppository (10 mg total) rectally daily as needed for moderate constipation or severe constipation.     Cholecalciferol (D3 ADULT PO) Take 1 each by mouth daily. OTC unsure of dose     hydrochlorothiazide  (HYDRODIURIL ) 25 MG tablet Take 1 tablet (25 mg total) by mouth daily. 90 tablet 0   ibuprofen  (ADVIL ) 200 MG tablet Take 200 mg by mouth daily.     methimazole  (TAPAZOLE ) 5 MG tablet Take by mouth.     metoprolol  succinate (TOPROL -XL) 50 MG 24 hr tablet TAKE 1 TABLET BY MOUTH ONCE DAILY WITH OR IMMEDIATELY FOLLOWING A MEAL 90 tablet 1   Multiple Vitamin (MULTI-VITAMINS) TABS Take 1 tablet by mouth daily.      ondansetron  (ZOFRAN ) 4 MG tablet Take 1 tablet (4 mg total) by mouth every 6 (six) hours as needed for nausea or vomiting.     polyethylene glycol (MIRALAX  / GLYCOLAX ) 17 g packet Take 17 g by mouth daily as needed for moderate constipation or mild constipation.     Probiotic Product (PROBIOTIC ADVANCED PO) Take 1 capsule by mouth daily. When taking antibiotics pt will take 2 capsules instead of 1, normally just takes 1 per day otherwise     traZODone  (DESYREL ) 50 MG tablet Take 0.5 tablets (25 mg total) by mouth at bedtime as needed for sleep. 90 tablet 1   vitamin B-12 (CYANOCOBALAMIN ) 1000 MCG tablet Take 1,000 mcg by mouth daily.     Zinc Acetate, Oral, (ZINC ACETATE PO) Take by mouth.     No current facility-administered medications for this visit.   Facility-Administered Medications Ordered in Other Visits  Medication Dose Route Frequency Provider Last Rate Last Admin   sodium chloride  flush (NS) 0.9 % injection 10 mL  10 mL Intravenous PRN Mick Tanguma J, MD   10 mL at 03/07/20 0836   sodium chloride  flush (NS) 0.9 % injection 10 mL  10 mL Intravenous PRN Shiryl Ruddy J, MD   10 mL at 06/21/20 1259    OBJECTIVE: Vitals:   07/03/23 0955  BP: (!) 131/56  Pulse:  95  Resp: 18  Temp: (!) 97.5 F (36.4 C)  SpO2: 100%     Body mass index is 33.75 kg/m.    ECOG FS:0 - Asymptomatic  General: Well-developed, well-nourished, no acute distress. Eyes: Pink conjunctiva, anicteric sclera. HEENT: Normocephalic, moist mucous membranes. Lungs: No audible wheezing or coughing. Heart: Regular rate and rhythm. Abdomen: Soft, nontender, no obvious distention. Musculoskeletal: No edema, cyanosis, or clubbing. Neuro: Alert, answering all questions appropriately. Cranial nerves grossly intact. Skin: No rashes or petechiae noted. Psych: Normal affect.  LAB RESULTS:  Lab Results  Component Value Date  NA 137 05/08/2023   K 3.6 05/08/2023   CL 95 (L) 05/08/2023   CO2 28 05/08/2023   GLUCOSE 103 (H) 05/08/2023   BUN 10 05/08/2023   CREATININE 0.53 (L) 05/08/2023   CALCIUM  9.4 05/08/2023   PROT 6.6 05/08/2023   ALBUMIN 4.3 05/08/2023   AST 15 05/08/2023   ALT 8 05/08/2023   ALKPHOS 87 05/08/2023   BILITOT 0.6 05/08/2023   GFRNONAA >60 02/14/2023   GFRAA >60 03/14/2020    Lab Results  Component Value Date   WBC 5.3 07/03/2023   NEUTROABS 3.2 07/03/2023   HGB 14.2 07/03/2023   HCT 39.8 07/03/2023   MCV 93.6 07/03/2023   PLT 173 07/03/2023     STUDIES: CT Abdomen Pelvis W Contrast Result Date: 07/03/2023 CLINICAL DATA:  Colon cancer restaging * Tracking Code: BO * EXAM: CT ABDOMEN AND PELVIS WITH CONTRAST TECHNIQUE: Multidetector CT imaging of the abdomen and pelvis was performed using the standard protocol following bolus administration of intravenous contrast. RADIATION DOSE REDUCTION: This exam was performed according to the departmental dose-optimization program which includes automated exposure control, adjustment of the mA and/or kV according to patient size and/or use of iterative reconstruction technique. CONTRAST:  85mL OMNIPAQUE  IOHEXOL  300 MG/ML  SOLN COMPARISON:  02/12/2022 FINDINGS: Lower chest: Aortic valve prosthesis. Mild  cardiomegaly. Mitral valve calcification. Pacer leads noted. Hepatobiliary: Stable 8 mm hypodense lesion in segment 2 of the liver, likely benign. Gallbladder absent. Pancreas: Unremarkable Spleen: Unremarkable Adrenals/Urinary Tract: Unremarkable Stomach/Bowel: Colonic diverticulosis most concentrated in the sigmoid colon. Right hemicolectomy. Vascular/Lymphatic: Atherosclerosis is present, including aortoiliac atherosclerotic disease. Reproductive: Unremarkable Other: No supplemental non-categorized findings. Musculoskeletal: Lumbar spondylosis and degenerative disc disease. Small umbilical hernia contains adipose tissue. Mild wall thickening and mucosal irregularity in the distal rectum, significance uncertain. IMPRESSION: 1. No findings of active malignancy. 2. Mild wall thickening and mucosal irregularity in the distal rectum, significance uncertain. 3. Colonic diverticulosis. 4. Small umbilical hernia contains adipose tissue. 5. Lumbar spondylosis and degenerative disc disease. 6. Aortic valve prosthesis. Mild cardiomegaly. Mitral valve calcification. 7. Aortic and systemic atherosclerosis. Aortic Atherosclerosis (ICD10-I70.0). Electronically Signed   By: Freida Jes M.D.   On: 07/03/2023 10:50    ASSESSMENT: Stage IIIb right colon cancer  PLAN:    Stage IIIb right colon cancer: Pathology results reviewed, confirming stage of disease.  Port has been removed.  She only received 11 treatments of FOLFOX secondary to declining performance status completing on April 25, 2020.  Patient underwent colonoscopy in June 2023 where 3 polyps were removed.  Repeat flexible sigmoidoscopy on March 20, 2022 revealed 1 rectal polyp.  Continue follow-up per GI recommendations.  Her most recent imaging on July 03, 2023 reviewed independently and reported as above with no obvious evidence of recurrent or progressive disease.  CEA continues to be within normal limits.  No intervention is needed at this time.   Continue yearly imaging until patient is 5 years removed from completing her treatment in November 2026.  Return to clinic in 6 months with laboratory work only and then in 1 year for laboratory work, repeat imaging, and further evaluation.  .  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  She completed 5 years of anastrozole in June 2016.  Her most recent mammogram on February 05, 2022 was reported as BI-RADS 1.  These are now ordered by her primary care physician. Heart disease: Continue follow-up at [facility] as  scheduled. History of Guillain-Barr: Unclear etiology.  Patient reports possibly related to flu shot.  Patient's symptoms have resolved.  Patient expressed understanding and was in agreement with this plan. She also understands that She can call clinic at any time with any questions, concerns, or complaints.    Cancer Staging  Colon cancer [facility]) Staging form: Colon and Rectum, AJCC 8th Edition - Clinical stage from 11/13/2019: Stage IIIB (cT3, cN1a, cM0) - Signed by Shellie Dials, MD on 11/13/2019 Total positive nodes: 1   Shellie Dials, MD   07/03/2023 2:03 PM

## 2023-07-04 DIAGNOSIS — Z96611 Presence of right artificial shoulder joint: Secondary | ICD-10-CM | POA: Diagnosis not present

## 2023-07-04 DIAGNOSIS — M25511 Pain in right shoulder: Secondary | ICD-10-CM | POA: Diagnosis not present

## 2023-07-04 LAB — CEA: CEA: 2 ng/mL (ref 0.0–4.7)

## 2023-07-05 ENCOUNTER — Encounter: Payer: Self-pay | Admitting: Pediatrics

## 2023-07-05 ENCOUNTER — Ambulatory Visit (INDEPENDENT_AMBULATORY_CARE_PROVIDER_SITE_OTHER): Payer: Medicare Other | Admitting: Pediatrics

## 2023-07-05 VITALS — BP 105/62 | HR 102 | Temp 98.2°F | Resp 16 | Wt 191.2 lb

## 2023-07-05 DIAGNOSIS — Z1330 Encounter for screening examination for mental health and behavioral disorders, unspecified: Secondary | ICD-10-CM | POA: Diagnosis not present

## 2023-07-05 DIAGNOSIS — I10 Essential (primary) hypertension: Secondary | ICD-10-CM | POA: Diagnosis not present

## 2023-07-05 DIAGNOSIS — Z131 Encounter for screening for diabetes mellitus: Secondary | ICD-10-CM

## 2023-07-05 LAB — BAYER DCA HB A1C WAIVED: HB A1C (BAYER DCA - WAIVED): 5.1 % (ref 4.8–5.6)

## 2023-07-05 NOTE — Progress Notes (Signed)
Office Visit  BP 105/62 (BP Location: Left Arm, Patient Position: Sitting, Cuff Size: Large)   Pulse (!) 102   Temp 98.2 F (36.8 C) (Oral)   Resp 16   Wt 191 lb 3.2 oz (86.7 kg)   SpO2 98%   BMI 33.87 kg/m    Subjective:    Patient ID: Maria Davies, female    DOB: 09-28-1940, 83 y.o.   MRN: [id number]  HPI: REN NEHLS is a 83 y.o. female  Chief Complaint  Patient presents with   Hypertension    Doing better and swelling is mostly resolved most days    Discussed the use of AI scribe software for clinical note transcription with the patient, who gave verbal consent to proceed.  History of Present Illness   The patient, with a history of hypertension, presented for a follow-up visit. She is currently on amlodipine 5mg , lotensin 20mg , hydrochlorothiazide 25mg , and metoprolol 50mg  XL. Her kidney function was normal at the last follow-up visit. The patient reported that her blood pressure medication was increased during her last visit, and she has not been monitoring her blood pressure at home.  The patient reported an episode of dizziness following a physical therapy session for a shoulder replacement. She described a sensation of the room spinning after sitting up from a lying position. However, she also noted that she had not eaten prior to the session. The patient has not experienced similar episodes at home.  The patient also reported a history of foot swelling, which has improved since the adjustment of her blood pressure medication. She denied any worsening of the swelling with the medication change. The patient also reported a history of prediabetes, but has not had recent blood work to monitor this condition.  The patient manages her own medications and has a routine of taking them early in the morning. She reported no issues with managing her medication regimen. The patient also reported a history of falling due to a pet cat in the house. She now takes precautions  to avoid tripping over the cat.  The patient is an early riser and has a routine of drinking black coffee in the morning, followed by a lot of water throughout the day, and occasionally a little bit of scotch. She reported no dietary restrictions or changes.     Relevant past medical, surgical, family and social history reviewed and updated as indicated. Interim medical history since our last visit reviewed. Allergies and medications reviewed and updated.  ROS per HPI unless specifically indicated above     Objective:    BP 105/62 (BP Location: Left Arm, Patient Position: Sitting, Cuff Size: Large)   Pulse (!) 102   Temp 98.2 F (36.8 C) (Oral)   Resp 16   Wt 191 lb 3.2 oz (86.7 kg)   SpO2 98%   BMI 33.87 kg/m   Wt Readings from Last 3 Encounters:  07/05/23 191 lb 3.2 oz (86.7 kg)  07/03/23 190 lb 8 oz (86.4 kg)  05/08/23 191 lb 9.6 oz (86.9 kg)     Physical Exam Constitutional:      Appearance: Normal appearance.  HENT:     Head: Normocephalic and atraumatic.  Eyes:     Pupils: Pupils are equal, round, and reactive to light.  Cardiovascular:     Rate and Rhythm: Normal rate and regular rhythm.     Pulses: Normal pulses.     Heart sounds: Normal heart sounds.  Pulmonary:  Effort: Pulmonary effort is normal.     Breath sounds: Normal breath sounds.  Musculoskeletal:        General: Normal range of motion.     Cervical back: Normal range of motion.  Skin:    General: Skin is warm and dry.     Capillary Refill: Capillary refill takes less than 2 seconds.  Neurological:     General: No focal deficit present.     Mental Status: She is alert. Mental status is at baseline.  Psychiatric:        Mood and Affect: Mood normal.        Behavior: Behavior normal.         07/05/2023    9:28 AM 11/05/2022    9:18 AM 09/10/2022    9:11 AM 07/09/2022    9:47 AM 06/08/2022    8:24 AM  Depression screen PHQ 2/9  Decreased Interest 0 0 0 0 0  Down, Depressed, Hopeless 0 0 1  0 0  PHQ - 2 Score 0 0 1 0 0  Altered sleeping  2 0 1 1  Tired, decreased energy  0 0 0 1  Change in appetite  0 0 0 0  Feeling bad or failure about yourself   0 0 0 0  Trouble concentrating  0 0 0 0  Moving slowly or fidgety/restless  0 0 0 0  Suicidal thoughts  0 0 0 0  PHQ-9 Score  2 1 1 2   Difficult doing work/chores  Not difficult at all Not difficult at all Not difficult at all Not difficult at all       07/05/2023    9:28 AM 11/05/2022    9:20 AM 07/09/2022    9:47 AM 06/08/2022    8:24 AM  GAD 7 : Generalized Anxiety Score  Nervous, Anxious, on Edge 0 0 1 0  Control/stop worrying 0 0 0 1  Worry too much - different things 0 0 0 0  Trouble relaxing 0 0 0 0  Restless 0 0 0 0  Easily annoyed or irritable 0 0 0 0  Afraid - awful might happen 0 0 0 0  Total GAD 7 Score 0 0 1 1  Anxiety Difficulty Not difficult at all Not difficult at all Not difficult at all Not difficult at all       Assessment & Plan:  Assessment & Plan   Primary hypertension Assessment & Plan: Well controlled on current regimen of amlodipine 5mg , lotensin 20mg , hydrochlorothiazide 25mg , and metoprolol 50mg  XL. No symptoms of orthostatic hypotension reported except for one episode post-physical therapy which may have been due to not eating. Mild pedal edema noted, but improved since last visit. -Continue current antihypertensive regimen. -Check kidney function today due to recent increase in hydrochlorothiazide dose.  Orders: -     Basic metabolic panel  Encounter for behavioral health screening As part of their intake evaluation, the patient was screened for depression, anxiety.  PHQ2 SCORE 0, GAD7 SCORE 0. Screening results negative for tested conditions. CTM.  Diabetes mellitus screening -     Bayer DCA Hb A1c Waived  Follow up plan: Return in about 6 months (around 01/02/2024) for HTN.  Dariyon Urquilla Howell Pringle, MD  Approximately 30 minutes spent on patient encounter today including assessment,  counseling, diagnosing, treatment plan development, and charting.

## 2023-07-05 NOTE — Patient Instructions (Signed)
Keep [place] posted if any lightheadness  Continue taking medication as directed

## 2023-07-05 NOTE — Assessment & Plan Note (Signed)
Well controlled on current regimen of amlodipine 5mg , lotensin 20mg , hydrochlorothiazide 25mg , and metoprolol 50mg  XL. No symptoms of orthostatic hypotension reported except for one episode post-physical therapy which may have been due to not eating. Mild pedal edema noted, but improved since last visit. -Continue current antihypertensive regimen. -Check kidney function today due to recent increase in hydrochlorothiazide dose.

## 2023-07-06 LAB — BASIC METABOLIC PANEL
BUN/Creatinine Ratio: 27 (ref 12–28)
BUN: 19 mg/dL (ref 8–27)
CO2: 26 mmol/L (ref 20–29)
Calcium: 9.5 mg/dL (ref 8.7–10.3)
Chloride: 92 mmol/L — ABNORMAL LOW (ref 96–106)
Creatinine, Ser: 0.7 mg/dL (ref 0.57–1.00)
Glucose: 98 mg/dL (ref 70–99)
Potassium: 3.7 mmol/L (ref 3.5–5.2)
Sodium: 135 mmol/L (ref 134–144)
eGFR: 87 mL/min/{1.73_m2} (ref >59)

## 2023-07-09 DIAGNOSIS — M25511 Pain in right shoulder: Secondary | ICD-10-CM | POA: Diagnosis not present

## 2023-07-09 DIAGNOSIS — Z96611 Presence of right artificial shoulder joint: Secondary | ICD-10-CM | POA: Diagnosis not present

## 2023-07-15 ENCOUNTER — Ambulatory Visit: Payer: Medicare Other | Attending: Surgery | Admitting: Occupational Therapy

## 2023-07-15 DIAGNOSIS — R200 Anesthesia of skin: Secondary | ICD-10-CM | POA: Insufficient documentation

## 2023-07-15 DIAGNOSIS — M6281 Muscle weakness (generalized): Secondary | ICD-10-CM | POA: Insufficient documentation

## 2023-07-15 DIAGNOSIS — M25641 Stiffness of right hand, not elsewhere classified: Secondary | ICD-10-CM | POA: Insufficient documentation

## 2023-07-15 NOTE — Therapy (Signed)
OUTPATIENT OCCUPATIONAL THERAPY ORTHO EVALUATION  Patient Name: Maria Davies MRN: [id number] DOB:12-17-1940, 83 y.o., female Today's Date: 07/15/2023  PCP: Dr Lynelle Smoke PROVIDER: Dr Joice Lofts  END OF SESSION:  OT End of Session - 07/15/23 1333     Visit Number 1    Number of Visits 12    Date for OT Re-Evaluation 08/26/23    OT Start Time 0947    OT Stop Time 1045    OT Time Calculation (min) 58 min    Activity Tolerance Patient tolerated treatment well    Behavior During Therapy [facility] for tasks assessed/performed             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   a.) stage IIIb; b.) RIGHT colon mass Bx (+) for invasive moderately differentiated adenocarcinoma (pT3 pN1a)   Anemia    Aortic atherosclerosis (HCC)    Aortic stenosis    a.) TTE 08/17/2016: EF >55%, mild AS (MPG 29.5); b.) TTE 08/25/2018: EF >55%; mod AS (MPG 42.3); c.) TTE 06/17/2020: EF >55%; severe AS (MPG 47.2); d.) s/p TAVR 08/31/2020 at [facility] - 23 mm Edwards Sapien 3 Ultra valve placed via a RIGHT percutaneous femoral approach --> complicated by intra/postoperative CHB requiring TVP and ultimate PPM placement   Arthritis    Basal cell carcinoma (BCC) of back    Bilateral leg edema    Breast cancer, right (HCC) 2011   a.) stage Ia IMC (ER/PR +, HER2/neu -); b.) lumpectomy + XRT in 2011; c.) completed 5 years AI therapy (anastrozole) in 11/2014   Cervical spinal stenosis    Cervical spondylosis    Chemotherapy induced nausea and vomiting    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   a.) TVP placed intraoperatively during TAVR secondary to development of CHB; b.) Medtronic PPM placed 09/03/2020 for postoperative continued postoperative CHB   Coronary artery disease    a.) RHC 08/11/2020: no sig CAD   DDD (degenerative disc disease), cervical    Diastolic dysfunction    a.) TTE 08/17/2016: EF >55%, mild LVH, mild LAE, triv panvalvular regurg, G1DD; b.) TTE  08/25/2018: EF >55%, mod LVH, mild LAE, triv AR/TR/PR, mild MR, G1dd; c.) TTE 08/11/2020: EF >55%, mild LVHH, mod LAE, triv MR/TR   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   Heart murmur    History of bilateral cataract extraction    Hyperplastic colon polyp    Hypertension    Hyperthyroidism    Neuropathy    Osteopenia    Personal history of other malignant neoplasm of large intestine 06/18/2020   PONV (postoperative nausea and vomiting)    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   a.) TVP placed intraoperatively during TAVR secondary to development of CHB; b.) Medtronic PPM placed 09/03/2020 for postoperative continued postoperative CHB   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   a.) s/p TAVR 08/31/2020 at [facility] - 23 mm Edwards Sapien 3 Ultra valve placed via a RIGHT percutaneous femoral approach --> complicated by intra/postoperative CHB requiring TVP and ultimate PPM placement   Thyroid nodule 01/25/2022   a.) CT C-spine 01/25/2022: 22 mm right thyroid nodule   Past Surgical History:  Procedure Laterality Date   BACK SURGERY     BREAST EXCISIONAL BIOPSY Right 03/2009   BREAST EXCISIONAL BIOPSY Bilateral    NEG   CATARACT EXTRACTION W/ INTRAOCULAR LENS  IMPLANT, BILATERAL     CHOLECYSTECTOMY     COLONOSCOPY WITH PROPOFOL N/A 10/19/2019  Procedure: COLONOSCOPY WITH PROPOFOL-attempted, unable to perform poor prep;  Surgeon: Midge Minium, MD;  Location: [facility];  Service: Endoscopy;  Laterality: N/A;  priority 3   COLONOSCOPY WITH PROPOFOL N/A 10/20/2019   Procedure: COLONOSCOPY WITH PROPOFOL;  Surgeon: Midge Minium, MD;  Location: [facility] END[facility]COPY;  Service: Endoscopy;  Laterality: N/A;   COLONOSCOPY WITH PROPOFOL N/A 11/16/2021   Procedure: COLONOSCOPY WITH PROPOFOL;  Surgeon: Midge Minium, MD;  Location: [facility] ENDOSCOPY;  Service: Endoscopy;  Laterality: N/A;   ESOPHAGEAL DILATION  10/19/2019   Procedure: ESOPHAGEAL DILATION;  Surgeon: Midge Minium, MD;  Location: [facility];  Service: Endoscopy;;   ESOPHAGOGASTRODUODENOSCOPY (EGD) WITH PROPOFOL N/A 10/19/2019   Procedure: ESOPHAGOGASTRODUODENOSCOPY (EGD) WITH PROPOFOL;  Surgeon: Midge Minium, MD;  Location: [facility];  Service: Endoscopy;  Laterality: N/A;   FLEXIBLE SIGMOIDOSCOPY N/A 03/20/2022   Procedure: FLEXIBLE SIGMOIDOSCOPY;  Surgeon: Midge Minium, MD;  Location: [facility] ENDOSCOPY;  Service: Endoscopy;  Laterality: N/A;   HEMORRHOID SURGERY     MASTECTOMY, PARTIAL Right 2010   PACEMAKER INSERTION N/A 09/03/2020   Procedure: PACEMAKER WITH LEFT BUNDLE LEAD (MEDTRONIC); Location: [facility]; Surgeon: Constance Haw, MD   [facility] REMOVAL  2021   PORTACATH PLACEMENT Right 11/23/2019   Procedure: INSERTION PORT-A-CATH;  Surgeon: Campbell Lerner, MD;  Location: [facility] ORS;  Service: General;  Laterality: Right;   RECTAL EXAM UNDER ANESTHESIA N/A 07/18/2022   Procedure: RECTAL EXAM UNDER ANESTHESIA;  Surgeon: Campbell Lerner, MD;  Location: [facility] ORS;  Service: General;  Laterality: N/A;   REVERSE SHOULDER ARTHROPLASTY Right 02/12/2023   Procedure: REVERSE SHOULDER ARTHROPLASTY;  Surgeon: Christena Flake, MD;  Location: [facility] ORS;  Service: Orthopedics;  Laterality: Right;   RIGHT COLECTOMY Right 10/2019   Procedure: ROBOTIC ASSISTED TIGHT HEMICOLECTOMY   RIGHT/LEFT HEART CATH AND CORONARY ANGIOGRAPHY Right 08/11/2020   Procedure: RIGHT/LEFT HEART CATH AND CORONARY ANGIOGRAPHY; Location: [facility]; Surgeon: Manson Allan, MD   TRANSCATHETER AORTIC VALVE REPLACEMENT, TRANSFEMORAL Right 08/31/2020   Procedure: TRANSCATHETER AORTIC VALVE REPLACEMENT, RIGHT TRANSFEMORAL; Location: [facility]; Surgeon: Clent Jacks, MD   TUMOR EXCISION N/A 04/11/2022   Procedure: TUMOR EXCISION RECTAL;  Surgeon: Campbell Lerner, MD;  Location: [facility] ORS;  Service: General;  Laterality: N/A;   Patient Active Problem List   Diagnosis Date Noted   Closed fracture dislocation of right shoulder 02/09/2023    Hyponatremia 02/08/2023   Closed fracture of right proximal humerus 02/07/2023   Closed fracture of right shoulder 02/07/2023   Fall at home, initial encounter 02/07/2023   Acute on chronic diastolic CHF (congestive heart failure) (HCC) 02/07/2023   Hypokalemia 02/07/2023   Obesity (BMI 30-39.9) 02/07/2023   Alcohol use 02/07/2023   History of colonic polyps    Rectal polyp    Personal history of colon cancer    Closed fracture of proximal end of left humerus 09/18/2021   Guillain Barr syndrome (HCC) 05/08/2021   Lumbar arthropathy 04/10/2021   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   Bilateral leg weakness 03/31/2021   Presence of permanent cardiac pacemaker 12/13/2020   Aortic atherosclerosis (HCC) 09/07/2020   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   Goals of care, counseling/discussion 11/19/2019   Status post laparoscopic colectomy 11/17/2019   Colon cancer (HCC) 10/28/2019   Iron deficiency anemia secondary to blood loss (chronic)    Benign neoplasm of cecum    Intestines neoplasm    Stricture and stenosis of esophagus    Advanced care planning/counseling discussion  07/22/2017   Hypercholesteremia 07/03/2016   Osteoporosis 07/03/2016   Carcinoma of right breast (HCC) 06/19/2016   Aortic stenosis, mild 05/02/2015   Hypertension 05/02/2015   Hyperthyroidism 05/02/2015   Breast cancer in female [facility]) 05/02/2015    ONSET DATE: 02/12/23  REFERRING DIAG: Fx of R 2nd digit middle phalanges with numbness R hand  THERAPY DIAG:  Stiffness of right hand, not elsewhere classified  Muscle weakness (generalized)  Numbness of right hand  Rationale for Evaluation and Treatment: Rehabilitation  SUBJECTIVE:   SUBJECTIVE STATEMENT: It is more than just 1 finger that is stiff.  I have a hard time using my fingers.  And then they numb too -I have a nerve conduction test this Friday thing. Pt accompanied  by: self  PERTINENT HISTORY: Last Ortho visit - Dr Joice Lofts - Maria Davies is a 83 y.o. female who presents for follow-up of a displaced P2 fracture of the right index finger which has been managed nonsurgically. She notes continued "stiffness" of the index finger, as well as some generalized weakness of her hand. She has been receiving formal therapy for her shoulder ( reverse shoulder arthroplasty 02/12/23), and states that she has not been receiving a little therapy as well for her hand. She denies any actual pain in the finger, and is not take any medications for discomfort. She is able to perform many of her normal daily activities without too much difficulty, and is sleeping well at night.  Her primary concern today is increased numbness/paresthesias to the right hand. She had been diagnosed with carpal tunnel syndrome by Horris Latino, PA-C, at her last visit 1 month ago. He had advised that she undergo an EMG to look for evidence of carpal tunnel syndrome, but the patient states that she was never called with an appointment for this test. She notes that her symptoms are worse with repetitive grasping or holding activities, as well as at night   PRECAUTIONS: L  Shoulder    WEIGHT BEARING RESTRICTIONS: No  PAIN:  Are you having pain? No  FALLS: Has patient fallen in last 6 months? Yes. Number of falls 1  LIVING ENVIRONMENT: Lives with: lives alone  PLOF: Lives alone and likes to cook.  Likes to read and watch sports especially baseball.  Has somebody to come to clean the house-do my own laundry  PATIENT GOALS: Want to be able to use my hand.  And the numbness better  NEXT MD VISIT: 08/09/23  OBJECTIVE:  Note: Objective measures were completed at Evaluation unless otherwise noted.  HAND DOMINANCE: Right   UPPER EXTREMITY ROM:     AROM for wrist and forearm WFL - decrease strength but WFL   Shoulder NT - seen by PT for reverse shoulder arthroplasty Active ROM Right eval  Left eval  Thumb MCP (0-60) 45   Thumb IP (0-80) 0   Thumb Radial abd/add (0-55) 40    Thumb Palmar abd/add (0-45) 45    Thumb Opposition to Small Finger Unable to do opposition to any digits     Index MCP (0-90) 55    Index PIP (0-100) 15    Index DIP (0-70)      Long MCP (0-90)  60    Long PIP (0-100)  90    Long DIP (0-70)      Ring MCP (0-90)  90    Ring PIP (0-100)  95    Ring DIP (0-70)      Little MCP (0-90)  90  Little PIP (0-100)  100    Little DIP (0-70)      (Blank rows = not tested)  HAND FUNCTION: Grip strength: Right: 5 lbs; Left: 40 lbs, Lateral pinch: Right: 2 lbs, Left: 9 lbs, and 3 point pinch: Right: UNABLE lbs, Left: 11 lbs  COORDINATION: Unable to do any 2 or 3 point pinch - decrease sensation in thumb and index down to base of digit- and tip of 3rd -   SENSATION: Numbness in thumb through third digit with thumb and second digits worse.  EDEMA: None noticed  COGNITION: Overall cognitive status: Within functional limits for tasks assessed  OBSERVATIONS:    TREATMENT DATE: 07/15/23                                                                                                                            Modalities: Fluidotherapy:  Time: 8 Location: R wrist and hand  Decrease stiffness increase motion prior to review of home exercises  Reviewed with patient appropriate gripping and holding of objects. Keeping thumb out of the palm. Avoiding lateral pinch to third digit Using buddy strap with gripping activities to encourage motion to the second digit Enlarge grips   Moist heat to 3 times a day prior to soft tissue massage to thumb webspace 10 reps to 15 reps Followed by carpal tunnel and metacarpal sprites if daughter can assist to open the carpal tunnel 15 reps Active assisted and passive range of motion for thumb palmar /radial abduction 12 reps Passive range of motion to thumb composite flexion 10 reps Followed by opposition picking up 2  cm foam block alternating digits 10 reps Passive range of motion to composite flexion for second and third digits 10 reps Followed by placing hold composite gripping into 2 cm foam block 10 reps  Reviewed with patient correct gripping of Thera-Band with therapy for shoulder to avoid wrist flexion thumb abduction and flexion into the carpal tunnel.        PATIENT EDUCATION: Education details: findings of eval and HEP  Person educated: Patient Education method: Explanation, Demonstration, Tactile cues, Verbal cues, and Handouts Education comprehension: verbalized understanding, returned demonstration, verbal cues required, and needs further education    GOALS: Goals reviewed with patient? Yes   LONG TERM GOALS: Target date: 6  Patient's thumb palmar radial abduction improved for patient to turn doorknob and pick up half full glass Baseline: Decreased thumb palmar radial abduction 40 to 45 degrees.  Unable to grasp around cylinder object, tight webspace Goal status: INITIAL  2.  Thumb flexion improved for patient to be able to do opposition to at least second and third digits to do 2 and 3-point pinch to pick up small objects. Baseline: Patient unable to do opposition to any finger's.  Decreased flexion of thumb-doing mostly lateral pinch to PIP of  third digit Goal status: INITIAL  3.  R 2nd and 3rd digits flexion improve for patient to pick up objects  using 3-point pinch as well as gripping around utensils Baseline: Right second digit into flexion 55 and PIP 15.  Third digit flexion MC 60 and PIP 90.  Cannot pick up any objects with a 3-point pinch or 2 point pinch cannot grip around cylinder objects using fifth and fourth digits Goal status: INITIAL  4.  Right grip and prehension strength improve with at least 3 to 5 pounds to be able to hold a half full glass, hold utensils, carry a plate and starting writing Baseline: Grip on the right 5 pounds in the left 40 pounds, lateral  pinch 2 pounds in the right left 9 pounds and 3-point pinch unable to do on the right and the left 11 pounds Goal status: INITIAL   ASSESSMENT:  CLINICAL IMPRESSION: Patient seen today for occupational therapy evaluation for right hand second digit middle phalanges fracture that occurred 02/12/2023.  Patient right-hand-dominant.  At that time she also fractured shoulder that resulted in a reverse shoulder arthroplasty done by Dr.Poggi-patient has been receiving physical therapy for the shoulder.  Patient referred for stiffness in the right dominant hand mostly in the second digit but also in thumb and third digit.  Appear patient has carpal tunnel symptoms.  With increased numbness in the thumb and  2nd more than the third.  Patient has a nerve conduction test scheduled for this Friday.  With increased stiffness in the right hand.  Patient gripping objects more with fourth and fifth  digits.  Pinching with the thumb in a lateral pinch to third PIP.  Causing  thumb CMC dropping into the carpal tunnel.  Reinforced with patient to keep thumb out of the palm ,working and addressing passive range of motion for thumb palmar /radial abduction as well as second and third digit.  Did fit patient with a buddy strap to assist in facilitating  flexion in between home exercises to 2nd digit.  Patient to focus on using lateral pinch of thumb in a normal pattern and 3-point pinch.  Patient show severe decrease grip and prehension strength in right dominant hand.  Patient can benefit from skilled OT services to address soft tissue as well as increase motion and increase strength to return to prior level of function - as well as modifications to tasks to decrease symptoms and increase use.  PERFORMANCE DEFICITS: in functional skills including ADLs, IADLs, ROM, strength, pain, flexibility, decreased knowledge of use of DME, and UE functional use,   and psychosocial skills including environmental adaptation and routines and  behaviors.   IMPAIRMENTS: are limiting patient from ADLs, IADLs, rest and sleep, play, leisure, and social participation.   COMORBIDITIES: has no other co-morbidities that affects occupational performance. Patient will benefit from skilled OT to address above impairments and improve overall function.  MODIFICATION OR ASSISTANCE TO COMPLETE EVALUATION: No modification of tasks or assist necessary to complete an evaluation.  OT OCCUPATIONAL PROFILE AND HISTORY: Problem focused assessment: Including review of records relating to presenting problem.  CLINICAL DECISION MAKING: LOW - limited treatment options, no task modification necessary  REHAB POTENTIAL: Good for goals  EVALUATION COMPLEXITY: Low    PLAN:  OT FREQUENCY: 1-2x/week  OT DURATION: 6 weeks  PLANNED INTERVENTIONS: 97535 self care/ADL training, 16109 therapeutic exercise, 97140 manual therapy, 97018 paraffin, 60454 fluidotherapy, passive range of motion, patient/family education, and DME and/or AE instructions    CONSULTED AND AGREED WITH PLAN OF CARE: Patient    Oletta Cohn, OTR/L,CLT 07/15/2023, 1:37 PM

## 2023-07-16 DIAGNOSIS — Z96611 Presence of right artificial shoulder joint: Secondary | ICD-10-CM | POA: Diagnosis not present

## 2023-07-16 DIAGNOSIS — M25511 Pain in right shoulder: Secondary | ICD-10-CM | POA: Diagnosis not present

## 2023-07-17 DIAGNOSIS — R200 Anesthesia of skin: Secondary | ICD-10-CM | POA: Diagnosis not present

## 2023-07-17 DIAGNOSIS — Z96611 Presence of right artificial shoulder joint: Secondary | ICD-10-CM | POA: Diagnosis not present

## 2023-07-17 DIAGNOSIS — R202 Paresthesia of skin: Secondary | ICD-10-CM | POA: Diagnosis not present

## 2023-07-17 DIAGNOSIS — S62620S Displaced fracture of medial phalanx of right index finger, sequela: Secondary | ICD-10-CM | POA: Diagnosis not present

## 2023-07-17 DIAGNOSIS — S4291XS Fracture of right shoulder girdle, part unspecified, sequela: Secondary | ICD-10-CM | POA: Diagnosis not present

## 2023-07-17 DIAGNOSIS — G5601 Carpal tunnel syndrome, right upper limb: Secondary | ICD-10-CM | POA: Diagnosis not present

## 2023-07-18 ENCOUNTER — Ambulatory Visit: Payer: Medicare Other | Admitting: Occupational Therapy

## 2023-07-18 DIAGNOSIS — M25511 Pain in right shoulder: Secondary | ICD-10-CM | POA: Diagnosis not present

## 2023-07-18 DIAGNOSIS — M25641 Stiffness of right hand, not elsewhere classified: Secondary | ICD-10-CM

## 2023-07-18 DIAGNOSIS — Z96611 Presence of right artificial shoulder joint: Secondary | ICD-10-CM | POA: Diagnosis not present

## 2023-07-18 DIAGNOSIS — R200 Anesthesia of skin: Secondary | ICD-10-CM | POA: Diagnosis not present

## 2023-07-18 DIAGNOSIS — M6281 Muscle weakness (generalized): Secondary | ICD-10-CM | POA: Diagnosis not present

## 2023-07-18 NOTE — Therapy (Signed)
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   a.) stage IIIb; b.) RIGHT colon mass Bx (+) for invasive moderately differentiated adenocarcinoma (pT3 pN1a)   Anemia    Aortic atherosclerosis (HCC)    Aortic stenosis    a.) TTE 08/17/2016: EF >55%, mild AS (MPG 29.5); b.) TTE 08/25/2018: EF >55%; mod AS (MPG 42.3); c.) TTE 06/17/2020: EF >55%; severe AS (MPG 47.2); d.) s/p TAVR 08/31/2020 at [facility] - 23 mm Edwards Sapien 3 Ultra valve placed via a RIGHT percutaneous femoral approach --> complicated by intra/postoperative CHB requiring TVP and ultimate PPM placement   Arthritis    Basal cell carcinoma (BCC) of back    Bilateral leg edema    Breast cancer, right (HCC) 2011   a.) stage Ia IMC (ER/PR +, HER2/neu -); b.) lumpectomy + XRT in 2011; c.) completed 5 years AI therapy (anastrozole) in 11/2014   Cervical spinal stenosis    Cervical spondylosis    Chemotherapy induced nausea and vomiting    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   a.) TVP placed intraoperatively during TAVR secondary to development of CHB; b.) Medtronic PPM placed 09/03/2020 for postoperative continued postoperative CHB   Coronary artery disease    a.) RHC 08/11/2020: no sig CAD   DDD (degenerative disc disease), cervical    Diastolic dysfunction    a.) TTE 08/17/2016: EF >55%, mild LVH, mild LAE, triv panvalvular regurg, G1DD; b.) TTE  08/25/2018: EF >55%, mod LVH, mild LAE, triv AR/TR/PR, mild MR, G1dd; c.) TTE 08/11/2020: EF >55%, mild LVHH, mod LAE, triv MR/TR   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   Heart murmur    History of bilateral cataract extraction    Hyperplastic colon polyp    Hypertension    Hyperthyroidism    Neuropathy    Osteopenia    Personal history of other malignant neoplasm of large intestine 06/18/2020   PONV (postoperative nausea and vomiting)    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   a.) TVP placed intraoperatively during TAVR secondary to development of CHB; b.) Medtronic PPM placed 09/03/2020 for postoperative continued postoperative CHB   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   a.) s/p TAVR 08/31/2020 at [facility] - 23 mm Edwards Sapien 3 Ultra valve placed via a RIGHT percutaneous femoral approach --> complicated by intra/postoperative CHB requiring TVP and ultimate PPM placement   Thyroid nodule 01/25/2022   a.) CT C-spine 01/25/2022: 22 mm right thyroid nodule   Past Surgical History:  Procedure Laterality Date   BACK SURGERY     BREAST EXCISIONAL BIOPSY Right 03/2009   BREAST EXCISIONAL BIOPSY Bilateral    NEG   CATARACT EXTRACTION W/ INTRAOCULAR LENS  IMPLANT, BILATERAL     CHOLECYSTECTOMY     COLONOSCOPY WITH PROPOFOL N/A 10/19/2019  Procedure: COLONOSCOPY WITH PROPOFOL-attempted, unable to perform poor prep;  Surgeon: Midge Minium, MD;  Location: [facility];  Service: Endoscopy;  Laterality: N/A;  priority 3   COLONOSCOPY WITH PROPOFOL N/A 10/20/2019   Procedure: COLONOSCOPY WITH PROPOFOL;  Surgeon: Midge Minium, MD;  Location: [facility] ENDOSCOPY;  Service: Endoscopy;  Laterality: N/A;   COLONOSCOPY WITH PROPOFOL N/A 11/16/2021   Procedure: COLONOSCOPY WITH PROPOFOL;  Surgeon: Midge Minium, MD;  Location: [facility] ENDOSCOPY;  Service: Endoscopy;  Laterality: N/A;   ESOPHAGEAL DILATION  10/19/2019   Procedure: ESOPHAGEAL DILATION;  Surgeon: Midge Minium, MD;  Location: [facility];  Service: Endoscopy;;   ESOPHAGOGASTRODUODENOSCOPY (EGD) WITH PROPOFOL N/A 10/19/2019   Procedure: ESOPHAGOGASTRODUODENOSCOPY (EGD) WITH PROPOFOL;  Surgeon: Midge Minium, MD;  Location: [facility];  Service: Endoscopy;  Laterality: N/A;   FLEXIBLE SIGMOIDOSCOPY N/A 03/20/2022   Procedure: FLEXIBLE SIGMOIDOSCOPY;  Surgeon: Midge Minium, MD;  Location: [facility] ENDOSCOPY;  Service: Endoscopy;  Laterality: N/A;   HEMORRHOID SURGERY     MASTECTOMY, PARTIAL Right 2010   PACEMAKER INSERTION N/A 09/03/2020   Procedure: PACEMAKER WITH LEFT BUNDLE LEAD (MEDTRONIC); Location: [facility]; Surgeon: Constance Haw, MD   [facility] REMOVAL  2021   PORTACATH PLACEMENT Right 11/23/2019   Procedure: INSERTION PORT-A-CATH;  Surgeon: Campbell Lerner, MD;  Location: [facility] ORS;  Service: General;  Laterality: Right;   RECTAL EXAM UNDER ANESTHESIA N/A 07/18/2022   Procedure: RECTAL EXAM UNDER ANESTHESIA;  Surgeon: Campbell Lerner, MD;  Location: [facility] ORS;  Service: General;  Laterality: N/A;   REVERSE SHOULDER ARTHROPLASTY Right 02/12/2023   Procedure: REVERSE SHOULDER ARTHROPLASTY;  Surgeon: Christena Flake, MD;  Location: [facility] ORS;  Service: Orthopedics;  Laterality: Right;   RIGHT COLECTOMY Right 10/2019   Procedure: ROBOTIC ASSISTED TIGHT HEMICOLECTOMY   RIGHT/LEFT HEART CATH AND CORONARY ANGIOGRAPHY Right 08/11/2020   Procedure: RIGHT/LEFT HEART CATH AND CORONARY ANGIOGRAPHY; Location: [facility]; Surgeon: Manson Allan, MD   TRANSCATHETER AORTIC VALVE REPLACEMENT, TRANSFEMORAL Right 08/31/2020   Procedure: TRANSCATHETER AORTIC VALVE REPLACEMENT, RIGHT TRANSFEMORAL; Location: [facility]; Surgeon: Clent Jacks, MD   TUMOR EXCISION N/A 04/11/2022   Procedure: TUMOR EXCISION RECTAL;  Surgeon: Campbell Lerner, MD;  Location: [facility] ORS;  Service: General;  Laterality: N/A;   Patient Active Problem List   Diagnosis Date Noted   Closed fracture dislocation of right shoulder 02/09/2023    Hyponatremia 02/08/2023   Closed fracture of right proximal humerus 02/07/2023   Closed fracture of right shoulder 02/07/2023   Fall at home, initial encounter 02/07/2023   Acute on chronic diastolic CHF (congestive heart failure) (HCC) 02/07/2023   Hypokalemia 02/07/2023   Obesity (BMI 30-39.9) 02/07/2023   Alcohol use 02/07/2023   History of colonic polyps    Rectal polyp    Personal history of colon cancer    Closed fracture of proximal end of left humerus 09/18/2021   Guillain Barr syndrome (HCC) 05/08/2021   Lumbar arthropathy 04/10/2021   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   Bilateral leg weakness 03/31/2021   Presence of permanent cardiac pacemaker 12/13/2020   Aortic atherosclerosis (HCC) 09/07/2020   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   Goals of care, counseling/discussion 11/19/2019   Status post laparoscopic colectomy 11/17/2019   Colon cancer (HCC) 10/28/2019   Iron deficiency anemia secondary to blood loss (chronic)    Benign neoplasm of cecum    Intestines neoplasm    Stricture and stenosis of esophagus    Advanced care planning/counseling discussion  07/22/2017   Hypercholesteremia 07/03/2016   Osteoporosis 07/03/2016   Carcinoma of right breast (HCC) 06/19/2016   Aortic stenosis, mild 05/02/2015   Hypertension 05/02/2015   Hyperthyroidism 05/02/2015   Breast cancer in female [facility]) 05/02/2015    ONSET DATE: 02/12/23  REFERRING DIAG: Fx of R 2nd digit middle phalanges with numbness R hand  THERAPY DIAG:  Stiffness of right hand, not elsewhere classified  Muscle weakness (generalized)  Numbness of right hand  Rationale for Evaluation and Treatment: Rehabilitation  SUBJECTIVE:   SUBJECTIVE STATEMENT: I had my nerve conduction test yesterday -and have some CT- done exercises - still numbness in fingers and thumb  Pt accompanied by: self  PERTINENT HISTORY: Last Ortho  visit - Dr Joice Lofts - Maria Davies is a 83 y.o. female who presents for follow-up of a displaced P2 fracture of the right index finger which has been managed nonsurgically. She notes continued "stiffness" of the index finger, as well as some generalized weakness of her hand. She has been receiving formal therapy for her shoulder ( reverse shoulder arthroplasty 02/12/23), and states that she has not been receiving a little therapy as well for her hand. She denies any actual pain in the finger, and is not take any medications for discomfort. She is able to perform many of her normal daily activities without too much difficulty, and is sleeping well at night.  Her primary concern today is increased numbness/paresthesias to the right hand. She had been diagnosed with carpal tunnel syndrome by Horris Latino, PA-C, at her last visit 1 month ago. He had advised that she undergo an EMG to look for evidence of carpal tunnel syndrome, but the patient states that she was never called with an appointment for this test. She notes that her symptoms are worse with repetitive grasping or holding activities, as well as at night   PRECAUTIONS: L  Shoulder    WEIGHT BEARING RESTRICTIONS: No  PAIN:  Are you having pain? No  FALLS: Has patient fallen in last 6 months? Yes. Number of falls 1  LIVING ENVIRONMENT: Lives with: lives alone  PLOF: Lives alone and likes to cook.  Likes to read and watch sports especially baseball.  Has somebody to come to clean the house-do my own laundry  PATIENT GOALS: Want to be able to use my hand.  And the numbness better  NEXT MD VISIT: 08/09/23  OBJECTIVE:  Note: Objective measures were completed at Evaluation unless otherwise noted.  HAND DOMINANCE: Right   UPPER EXTREMITY ROM:     AROM for wrist and forearm WFL - decrease strength but WFL   Shoulder NT - seen by PT for reverse shoulder arthroplasty Active ROM Right eval Left eval R 07/18/23  Thumb MCP (0-60) 45  55   Thumb IP (0-80) 0  0  Thumb Radial abd/add (0-55) 40   45  Thumb Palmar abd/add (0-45) 45   40  Thumb Opposition to Small Finger Unable to do opposition to any digits      Index MCP (0-90) 55   60  Index PIP (0-100) 15     Index DIP (0-70)       Long MCP (0-90)  60   90  Long PIP (0-100)  90   95  Long DIP (0-70)       Ring MCP (0-90)  90   90  Ring PIP (0-100)  95   100  Ring DIP (0-70)       Little MCP (  0-90)  90   90  Little PIP (0-100)  100   100  Little DIP (0-70)       (Blank rows = not tested)  HAND FUNCTION: Grip strength: Right: 5 lbs; Left: 40 lbs, Lateral pinch: Right: 2 lbs, Left: 9 lbs, and 3 point pinch: Right: UNABLE lbs, Left: 11 lbs Grip 8 lbs and lat inch 3 lbs - 07/18/23  COORDINATION: Unable to do any 2 or 3 point pinch - decrease sensation in thumb and index down to base of digit- and tip of 3rd -   SENSATION: Numbness in thumb through third digit with thumb and second digits worse.  EDEMA: None noticed  COGNITION: Overall cognitive status: Within functional limits for tasks assessed  OBSERVATIONS:    TREATMENT DATE: 07/18/23                                                                                                                            Modalities: paraffin  Time: 8 Location: coban flexion wrap to 2nd and 3rd digit hand  Decrease stiffness increase motion prior to review of home exercises  AGAIN Reviewed with patient appropriate gripping and holding of objects. Keeping thumb out of the palm. Avoiding lateral pinch to third digit Using buddy strap with gripping activities to encourage motion to the second digit Enlarge grips  Fit with CMC neoprene splint to use during day and night to keep thumb CMC out of palm and CT     Pt to use Moist heat to 3 times a day prior to soft tissue massage to thumb webspace 10 reps to 15 reps Followed by carpal tunnel and metacarpal sprites if daughter can assist to open the carpal tunnel 15  reps Done as well as graston tool nr 2 brushing and sweeping over volar palm and thumb , 2nd and 3rd digit Pt increase sensation in 3rd digit today Active assisted and passive range of motion for thumb palmar /radial abduction 12 reps Pt can do PROM with HEP Show progress in PA  Passive range of motion to thumb composite flexion 10 reps Thumb IP numbness- no IP flexion AROM Followed by opposition picking up 2 cm foam block alternating digits 10 reps Passive range of motion to composite flexion for 2nd thru 5th  digits 10 reps Followed by placing hold composite gripping into 2 cm foam block 10 reps   Reviewed with patient correct gripping of Thera-Band with therapy for shoulder to avoid wrist flexion thumb abduction and flexion into the carpal tunnel.        PATIENT EDUCATION: Education details: findings of eval and HEP  Person educated: Patient Education method: Explanation, Demonstration, Tactile cues, Verbal cues, and Handouts Education comprehension: verbalized understanding, returned demonstration, verbal cues required, and needs further education    GOALS: Goals reviewed with patient? Yes   LONG TERM GOALS: Target date: 6  Patient's thumb palmar radial abduction improved for patient to turn doorknob and pick up  half full glass Baseline: Decreased thumb palmar radial abduction 40 to 45 degrees.  Unable to grasp around cylinder object, tight webspace Goal status: INITIAL  2.  Thumb flexion improved for patient to be able to do opposition to at least second and third digits to do 2 and 3-point pinch to pick up small objects. Baseline: Patient unable to do opposition to any finger's.  Decreased flexion of thumb-doing mostly lateral pinch to PIP of  third digit Goal status: INITIAL  3.  R 2nd and 3rd digits flexion improve for patient to pick up objects using 3-point pinch as well as gripping around utensils Baseline: Right second digit into flexion 55 and PIP 15.  Third  digit flexion MC 60 and PIP 90.  Cannot pick up any objects with a 3-point pinch or 2 point pinch cannot grip around cylinder objects using fifth and fourth digits Goal status: INITIAL  4.  Right grip and prehension strength improve with at least 3 to 5 pounds to be able to hold a half full glass, hold utensils, carry a plate and starting writing Baseline: Grip on the right 5 pounds in the left 40 pounds, lateral pinch 2 pounds in the right left 9 pounds and 3-point pinch unable to do on the right and the left 11 pounds Goal status: INITIAL   ASSESSMENT:  CLINICAL IMPRESSION: Patient seen right hand second digit middle phalanges fracture that occurred 02/12/2023.  Patient right-hand-dominant.  At that time she also fractured shoulder that resulted in a reverse shoulder arthroplasty done by Dr.Poggi-patient has been receiving physical therapy for the shoulder.  Patient referred for stiffness in the right dominant hand mostly in the second digit but also in thumb and third digit.  Appear patient has carpal tunnel symptoms. Numbness in the thumb IP and  2nd  digit - with 3rd at DIP.  Patient had a nerve conduction test scheduled since last time - per pt Dr Sherryll Burger said she has CT -Pt come in today with increase flexion of digits , and ability to do lat pinch to side of 2nd digit -Pt  cont to show increased stiffness in the right hand.  Patient gripping objects more with fourth and fifth  digits. Was pinching with the thumb in a lateral pinch to third PIP.  Causing  thumb CMC dropping into the carpal tunnel.  Reinforced with patient to keep thumb out of the palm ,working and addressing passive range of motion for thumb palmar /radial abduction as well as second and third digit. Pt to cont with buddy strap to assist in facilitating  flexion in between home exercises to 2nd digit.  Patient to focus on using lateral pinch of thumb in a normal pattern and 3-point pinch.  Patient show severe decrease grip and  prehension strength in right dominant hand.  Patient can benefit from skilled OT services to address soft tissue as well as increase motion and increase strength to return to prior level of function - as well as modifications to tasks to decrease symptoms and increase use.  PERFORMANCE DEFICITS: in functional skills including ADLs, IADLs, ROM, strength, pain, flexibility, decreased knowledge of use of DME, and UE functional use,   and psychosocial skills including environmental adaptation and routines and behaviors.   IMPAIRMENTS: are limiting patient from ADLs, IADLs, rest and sleep, play, leisure, and social participation.   COMORBIDITIES: has no other co-morbidities that affects occupational performance. Patient will benefit from skilled OT to address above impairments and improve overall function.  MODIFICATION OR ASSISTANCE TO COMPLETE EVALUATION: No modification of tasks or assist necessary to complete an evaluation.  OT OCCUPATIONAL PROFILE AND HISTORY: Problem focused assessment: Including review of records relating to presenting problem.  CLINICAL DECISION MAKING: LOW - limited treatment options, no task modification necessary  REHAB POTENTIAL: Good for goals  EVALUATION COMPLEXITY: Low    PLAN:  OT FREQUENCY: 1-2x/week  OT DURATION: 6 weeks  PLANNED INTERVENTIONS: 97535 self care/ADL training, 40981 therapeutic exercise, 97140 manual therapy, 97018 paraffin, 19147 fluidotherapy, passive range of motion, patient/family education, and DME and/or AE instructions    CONSULTED AND AGREED WITH PLAN OF CARE: Patient    Oletta Cohn, OTR/L,CLT 07/18/2023, 10:27 AM

## 2023-07-23 ENCOUNTER — Ambulatory Visit: Payer: Medicare Other | Attending: Surgery | Admitting: Occupational Therapy

## 2023-07-23 DIAGNOSIS — M25641 Stiffness of right hand, not elsewhere classified: Secondary | ICD-10-CM | POA: Insufficient documentation

## 2023-07-23 DIAGNOSIS — M6281 Muscle weakness (generalized): Secondary | ICD-10-CM | POA: Diagnosis not present

## 2023-07-23 DIAGNOSIS — R200 Anesthesia of skin: Secondary | ICD-10-CM | POA: Diagnosis not present

## 2023-07-23 NOTE — Therapy (Signed)
 OUTPATIENT OCCUPATIONAL THERAPY ORTHO TREATMENT  Patient Name: Maria Davies MRN: [id number] DOB:10-23-1940, 83 y.o., female Today's Date: 07/23/2023  PCP: Dr Vicci MART PROVIDER: Dr Edie  END OF SESSION:  OT End of Session - 07/23/23 1313     Visit Number 3    Number of Visits 12    Date for OT Re-Evaluation 08/26/23    OT Start Time 1315    OT Stop Time 1404    OT Time Calculation (min) 49 min    Activity Tolerance Patient tolerated treatment well    Behavior During Therapy [facility] for tasks assessed/performed             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   a.) stage IIIb; b.) RIGHT colon mass Bx (+) for invasive moderately differentiated adenocarcinoma (pT3 pN1a)   Anemia    Aortic atherosclerosis (HCC)    Aortic stenosis    a.) TTE 08/17/2016: EF >55%, mild AS (MPG 29.5); b.) TTE 08/25/2018: EF >55%; mod AS (MPG 42.3); c.) TTE 06/17/2020: EF >55%; severe AS (MPG 47.2); d.) s/p TAVR 08/31/2020 at [facility] - 23 mm Edwards Sapien 3 Ultra valve placed via a RIGHT percutaneous femoral approach --> complicated by intra/postoperative CHB requiring TVP and ultimate PPM placement   Arthritis    Basal cell carcinoma (BCC) of back    Bilateral leg edema    Breast cancer, right (HCC) 2011   a.) stage Ia IMC (ER/PR +, HER2/neu -); b.) lumpectomy + XRT in 2011; c.) completed 5 years AI therapy (anastrozole) in 11/2014   Cervical spinal stenosis    Cervical spondylosis    Chemotherapy induced nausea and vomiting    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   a.) TVP placed intraoperatively during TAVR secondary to development of CHB; b.) Medtronic PPM placed 09/03/2020 for postoperative continued postoperative CHB   Coronary artery disease    a.) RHC 08/11/2020: no sig CAD   DDD (degenerative disc disease), cervical    Diastolic dysfunction    a.) TTE 08/17/2016: EF >55%, mild LVH, mild LAE, triv panvalvular regurg, G1DD; b.) TTE  08/25/2018: EF >55%, mod LVH, mild LAE, triv AR/TR/PR, mild MR, G1dd; c.) TTE 08/11/2020: EF >55%, mild LVHH, mod LAE, triv MR/TR   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   Heart murmur    History of bilateral cataract extraction    Hyperplastic colon polyp    Hypertension    Hyperthyroidism    Neuropathy    Osteopenia    Personal history of other malignant neoplasm of large intestine 06/18/2020   PONV (postoperative nausea and vomiting)    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   a.) TVP placed intraoperatively during TAVR secondary to development of CHB; b.) Medtronic PPM placed 09/03/2020 for postoperative continued postoperative CHB   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   a.) s/p TAVR 08/31/2020 at [facility] - 23 mm Edwards Sapien 3 Ultra valve placed via a RIGHT percutaneous femoral approach --> complicated by intra/postoperative CHB requiring TVP and ultimate PPM placement   Thyroid  nodule 01/25/2022   a.) CT C-spine 01/25/2022: 22 mm right thyroid  nodule   Past Surgical History:  Procedure Laterality Date   BACK SURGERY     BREAST EXCISIONAL BIOPSY Right 03/2009   BREAST EXCISIONAL BIOPSY Bilateral    NEG   CATARACT EXTRACTION W/ INTRAOCULAR LENS  IMPLANT, BILATERAL     CHOLECYSTECTOMY     COLONOSCOPY WITH PROPOFOL  N/A 10/19/2019  Procedure: COLONOSCOPY WITH PROPOFOL -attempted, unable to perform poor prep;  Surgeon: Jinny Carmine, MD;  Location: [facility];  Service: Endoscopy;  Laterality: N/A;  priority 3   COLONOSCOPY WITH PROPOFOL  N/A 10/20/2019   Procedure: COLONOSCOPY WITH PROPOFOL ;  Surgeon: Jinny Carmine, MD;  Location: [facility];  Service: Endoscopy;  Laterality: N/A;   COLONOSCOPY WITH PROPOFOL  N/A 11/16/2021   Procedure: COLONOSCOPY WITH PROPOFOL ;  Surgeon: Jinny Carmine, MD;  Location: [facility];  Service: Endoscopy;  Laterality: N/A;   ESOPHAGEAL DILATION  10/19/2019   Procedure: ESOPHAGEAL DILATION;  Surgeon: Jinny Carmine, MD;  Location: [facility];  Service: Endoscopy;;   ESOPHAGOGASTRODUODENOSCOPY (EGD) WITH PROPOFOL  N/A 10/19/2019   Procedure: ESOPHAGOGASTRODUODENOSCOPY (EGD) WITH PROPOFOL ;  Surgeon: Jinny Carmine, MD;  Location: [facility];  Service: Endoscopy;  Laterality: N/A;   FLEXIBLE SIGMOIDOSCOPY N/A 03/20/2022   Procedure: FLEXIBLE SIGMOIDOSCOPY;  Surgeon: Jinny Carmine, MD;  Location: [facility];  Service: Endoscopy;  Laterality: N/A;   HEMORRHOID SURGERY     MASTECTOMY, PARTIAL Right 2010   PACEMAKER INSERTION N/A 09/03/2020   Procedure: PACEMAKER WITH LEFT BUNDLE LEAD (MEDTRONIC); Location: [facility]; Surgeon: Clem Repress, MD   [facility] REMOVAL  2021   PORTACATH PLACEMENT Right 11/23/2019   Procedure: INSERTION PORT-A-CATH;  Surgeon: Lane Shope, MD;  Location: [facility] OR[facility];  Service: General;  Laterality: Right;   RECTAL EXAM UNDER ANESTHESIA N/A 07/18/2022   Procedure: RECTAL EXAM UNDER ANESTHESIA;  Surgeon: Lane Shope, MD;  Location: [facility];  Service: General;  Laterality: N/A;   REVERSE SHOULDER ARTHROPLASTY Right 02/12/2023   Procedure: REVERSE SHOULDER ARTHROPLASTY;  Surgeon: Edie Norleen PARAS, MD;  Location: [facility];  Service: Orthopedics;  Laterality: Right;   RIGHT COLECTOMY Right 10/2019   Procedure: ROBOTIC ASSISTED TIGHT HEMICOLECTOMY   RIGHT/LEFT HEART CATH AND CORONARY ANGIOGRAPHY Right 08/11/2020   Procedure: RIGHT/LEFT HEART CATH AND CORONARY ANGIOGRAPHY; Location: [facility]; Surgeon: Krystal Apgar, MD   TRANSCATHETER AORTIC VALVE REPLACEMENT, TRANSFEMORAL Right 08/31/2020   Procedure: TRANSCATHETER AORTIC VALVE REPLACEMENT, RIGHT TRANSFEMORAL; Location: [facility]; Surgeon: Reyes Fruits, MD   TUMOR EXCISION N/A 04/11/2022   Procedure: TUMOR EXCISION RECTAL;  Surgeon: Lane Shope, MD;  Location: [facility];  Service: General;  Laterality: N/A;   Patient Active Problem List   Diagnosis Date Noted   Closed fracture dislocation of right shoulder 02/09/2023    Hyponatremia 02/08/2023   Closed fracture of right proximal humerus 02/07/2023   Closed fracture of right shoulder 02/07/2023   Fall at home, initial encounter 02/07/2023   Acute on chronic diastolic CHF (congestive heart failure) (HCC) 02/07/2023   Hypokalemia 02/07/2023   Obesity (BMI 30-39.9) 02/07/2023   Alcohol use 02/07/2023   History of colonic polyps    Rectal polyp    Personal history of colon cancer    Closed fracture of proximal end of left humerus 09/18/2021   Guillain Barr syndrome (HCC) 05/08/2021   Lumbar arthropathy 04/10/2021   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   Bilateral leg weakness 03/31/2021   Presence of permanent cardiac pacemaker 12/13/2020   Aortic atherosclerosis (HCC) 09/07/2020   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   Goals of care, counseling/discussion 11/19/2019   Status post laparoscopic colectomy 11/17/2019   Colon cancer (HCC) 10/28/2019   Iron deficiency anemia secondary to blood loss (chronic)    Benign neoplasm of cecum    Intestines neoplasm    Stricture and stenosis of esophagus    Advanced care planning/counseling discussion  07/22/2017   Hypercholesteremia 07/03/2016   Osteoporosis 07/03/2016   Carcinoma of right breast (HCC) 06/19/2016   Aortic stenosis, mild 05/02/2015   Hypertension 05/02/2015   Hyperthyroidism 05/02/2015   Breast cancer in female [facility]) 05/02/2015    ONSET DATE: 02/12/23  REFERRING DIAG: Fx of R 2nd digit middle phalanges with numbness R hand  THERAPY DIAG:  Stiffness of right hand, not elsewhere classified  Muscle weakness (generalized)  Numbness of right hand  Rationale for Evaluation and Treatment: Rehabilitation  SUBJECTIVE:   SUBJECTIVE STATEMENT: Thumb motion little better- my daughter came to help massage my hand- think some of the numbness better- but no pain really   Pt accompanied by: self  PERTINENT HISTORY:  Last Ortho visit - Dr Edie - WYONA NEILS is a 83 y.o. female who presents for follow-up of a displaced P2 fracture of the right index finger which has been managed nonsurgically. She notes continued stiffness of the index finger, as well as some generalized weakness of her hand. She has been receiving formal therapy for her shoulder ( reverse shoulder arthroplasty 02/12/23), and states that she has not been receiving a little therapy as well for her hand. She denies any actual pain in the finger, and is not take any medications for discomfort. She is able to perform many of her normal daily activities without too much difficulty, and is sleeping well at night.  Her primary concern today is increased numbness/paresthesias to the right hand. She had been diagnosed with carpal tunnel syndrome by Gustavo Level, PA-C, at her last visit 1 month ago. He had advised that she undergo an EMG to look for evidence of carpal tunnel syndrome, but the patient states that she was never called with an appointment for this test. She notes that her symptoms are worse with repetitive grasping or holding activities, as well as at night   PRECAUTIONS: L  Shoulder    WEIGHT BEARING RESTRICTIONS: No  PAIN:  Are you having pain? No  FALLS: Has patient fallen in last 6 months? Yes. Number of falls 1  LIVING ENVIRONMENT: Lives with: lives alone  PLOF: Lives alone and likes to cook.  Likes to read and watch sports especially baseball.  Has somebody to come to clean the house-do my own laundry  PATIENT GOALS: Want to be able to use my hand.  And the numbness better  NEXT MD VISIT: 08/09/23  OBJECTIVE:  Note: Objective measures were completed at Evaluation unless otherwise noted.  HAND DOMINANCE: Right   UPPER EXTREMITY ROM:     AROM for wrist and forearm WFL - decrease strength but WFL   Shoulder NT - seen by PT for reverse shoulder arthroplasty Active ROM Right eval Left eval R 07/18/23 R 07/23/23   Thumb MCP (0-60) 45  55 50  Thumb IP (0-80) 0  0   Thumb Radial abd/add (0-55) 40   45 50  Thumb Palmar abd/add (0-45) 45   40 50  Thumb Opposition to Small Finger Unable to do opposition to any digits       Index MCP (0-90) 55   60 75  Index PIP (0-100) 15      Index DIP (0-70)        Long MCP (0-90)  60   90 90  Long PIP (0-100)  90   95 90  Long DIP (0-70)        Ring MCP (0-90)  90   90 90  Ring PIP (0-100)  95   100 100  Ring DIP (0-70)        Little MCP (0-90)  90   90 90  Little PIP (0-100)  100   100 100  Little DIP (0-70)        (Blank rows = not tested)  HAND FUNCTION: Grip strength: Right: 5 lbs; Left: 40 lbs, Lateral pinch: Right: 2 lbs, Left: 9 lbs, and 3 point pinch: Right: UNABLE lbs, Left: 11 lbs Grip 8 lbs and lat inch 3 lbs - 07/18/23  COORDINATION: Unable to do any 2 or 3 point pinch - decrease sensation in thumb and index down to base of digit- and tip of 3rd -   SENSATION: Numbness in thumb through third digit with thumb and second digits worse.  EDEMA: None noticed  COGNITION: Overall cognitive status: Within functional limits for tasks assessed  OBSERVATIONS:    TREATMENT DATE: 07/23/23       Assess progress in ROM and numbness- increase 2nd MC and thumb PA and RA Decrease  strength flexion of wrist and pronation      Unable to maintain wrist in neutral with grip  assess- wrist in ext and sup unstable                                                                                                                      Modalities: paraffin  Time: 8 Location: coban flexion wrap to 2nd and 3rd digit hand  Decrease stiffness increase motion prior to review of home exercises  AGAIN Reviewed with patient appropriate gripping and holding of objects. Keeping thumb out of the palm. Avoiding lateral pinch to third digit Using buddy strap with gripping activities to encourage motion to the second digit Enlarge grips  Cont with CMC neoprene splint to use  during day and night to keep thumb CMC out of palm and CT     Pt to use Moist heat to 3 times a day prior to soft tissue massage to thumb webspace 10 reps to 15 reps Followed by carpal tunnel and metacarpal sprites if daughter can assist to open the carpal tunnel 15 reps Done as well as graston tool nr 2 brushing and sweeping over volar palm and thumb , 2nd and 3rd digit Pt increase sensation in 3rd digit today Active assisted and passive range of motion for thumb palmar /radial abduction 12 reps Pt can do PROM with HEP Show progress in PA /RA Passive range of motion to thumb composite flexion 10 reps Thumb IP numbness- no IP flexion AROM Followed by opposition picking up 2 cm foam block alternating digits 10 reps- could do 3rd thru 5th- unable to flexion IP of 2nd diti Passive range of motion to composite flexion for 2nd thru 5th  digits 10 reps Followed by placing hold composite gripping into 2 cm foam block 10 reps   Reviewed with patient correct gripping of Thera-Band with therapy for shoulder to avoid wrist flexion thumb abduction and flexion into the carpal  tunnel.  16oz hammer for wrist in all planes- head of hammer on radial side  Standing UD and RD 12 reps Rest forearm on armrest- sup/pro 12 resp - had time doing pronation And wrist flexion - 12 reps- able to do midline with head of hammer radially 2 sets  1 x day         PATIENT EDUCATION: Education details: findings of eval and HEP  Person educated: Patient Education method: Explanation, Demonstration, Tactile cues, Verbal cues, and Handouts Education comprehension: verbalized understanding, returned demonstration, verbal cues required, and needs further education    GOALS: Goals reviewed with patient? Yes   LONG TERM GOALS: Target date: 6  Patient's thumb palmar radial abduction improved for patient to turn doorknob and pick up half full glass Baseline: Decreased thumb palmar radial abduction 40 to 45  degrees.  Unable to grasp around cylinder object, tight webspace Goal status: INITIAL  2.  Thumb flexion improved for patient to be able to do opposition to at least second and third digits to do 2 and 3-point pinch to pick up small objects. Baseline: Patient unable to do opposition to any finger's.  Decreased flexion of thumb-doing mostly lateral pinch to PIP of  third digit Goal status: INITIAL  3.  R 2nd and 3rd digits flexion improve for patient to pick up objects using 3-point pinch as well as gripping around utensils Baseline: Right second digit into flexion 55 and PIP 15.  Third digit flexion MC 60 and PIP 90.  Cannot pick up any objects with a 3-point pinch or 2 point pinch cannot grip around cylinder objects using fifth and fourth digits Goal status: INITIAL  4.  Right grip and prehension strength improve with at least 3 to 5 pounds to be able to hold a half full glass, hold utensils, carry a plate and starting writing Baseline: Grip on the right 5 pounds in the left 40 pounds, lateral pinch 2 pounds in the right left 9 pounds and 3-point pinch unable to do on the right and the left 11 pounds Goal status: INITIAL   ASSESSMENT:  CLINICAL IMPRESSION: Patient seen right hand second digit middle phalanges fracture that occurred 02/12/2023.  Patient right-hand-dominant.  At that time she also fractured shoulder that resulted in a reverse shoulder arthroplasty done by Dr.Poggi-patient has been receiving physical therapy for the shoulder.  Patient referred for stiffness in the right dominant hand mostly in the second digit but also in thumb and third digit.  Appear patient has carpal tunnel symptoms. Numbness in the thumb IP and  2nd  digit - with 3rd at DIP.  Patient had a nerve conduction test scheduled since last time - per pt Dr Maree said she has CT -Pt come in today with increase flexion of 2nd digit MC , and ability to do lat pinch to side of 2nd digit - increase thumb PA and RA - Pt  cont  to show increased stiffness in the right hand.  Patient gripping objects more with fourth and fifth  digits. Was pinching with the thumb in a lateral pinch to third PIP.  Causing  thumb CMC dropping into the carpal tunnel.  Reinforced with patient to keep thumb out of the palm ,working and addressing passive range of motion for thumb palmar /radial abduction as well as second and third digit. Pt to cont with buddy strap to assist in facilitating  flexion in between home exercises to 2nd digit.  Patient to focus on using lateral  pinch of thumb in a normal pattern and 3-point pinch.  Patient show severe decrease grip and prehension strength in right dominant hand.  Decrease strength in pronation and wrist flexion- add to HEP 16 oz hammder -  Patient can benefit from skilled OT services to address soft tissue as well as increase motion and increase strength to return to prior level of function - as well as modifications to tasks to decrease symptoms and increase use.  PERFORMANCE DEFICITS: in functional skills including ADLs, IADLs, ROM, strength, pain, flexibility, decreased knowledge of use of DME, and UE functional use,   and psychosocial skills including environmental adaptation and routines and behaviors.   IMPAIRMENTS: are limiting patient from ADLs, IADLs, rest and sleep, play, leisure, and social participation.   COMORBIDITIES: has no other co-morbidities that affects occupational performance. Patient will benefit from skilled OT to address above impairments and improve overall function.  MODIFICATION OR ASSISTANCE TO COMPLETE EVALUATION: No modification of tasks or assist necessary to complete an evaluation.  OT OCCUPATIONAL PROFILE AND HISTORY: Problem focused assessment: Including review of records relating to presenting problem.  CLINICAL DECISION MAKING: LOW - limited treatment options, no task modification necessary  REHAB POTENTIAL: Good for goals  EVALUATION COMPLEXITY: Low     PLAN:  OT FREQUENCY: 1-2x/week  OT DURATION: 6 weeks  PLANNED INTERVENTIONS: 97535 self care/ADL training, 02889 therapeutic exercise, 97140 manual therapy, 97018 paraffin, 02960 fluidotherapy, passive range of motion, patient/family education, and DME and/or AE instructions    CONSULTED AND AGREED WITH PLAN OF CARE: Patient    Ancel Peters, OTR/L,CLT 07/23/2023, 3:08 PM

## 2023-07-24 DIAGNOSIS — Z96611 Presence of right artificial shoulder joint: Secondary | ICD-10-CM | POA: Diagnosis not present

## 2023-07-24 DIAGNOSIS — M25511 Pain in right shoulder: Secondary | ICD-10-CM | POA: Diagnosis not present

## 2023-07-25 ENCOUNTER — Ambulatory Visit: Payer: Medicare Other | Admitting: Occupational Therapy

## 2023-07-25 DIAGNOSIS — M25641 Stiffness of right hand, not elsewhere classified: Secondary | ICD-10-CM | POA: Diagnosis not present

## 2023-07-25 DIAGNOSIS — M6281 Muscle weakness (generalized): Secondary | ICD-10-CM | POA: Diagnosis not present

## 2023-07-25 DIAGNOSIS — R200 Anesthesia of skin: Secondary | ICD-10-CM | POA: Diagnosis not present

## 2023-07-25 NOTE — Therapy (Signed)
 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   a.) stage IIIb; b.) RIGHT colon mass Bx (+) for invasive moderately differentiated adenocarcinoma (pT3 pN1a)   Anemia    Aortic atherosclerosis (HCC)    Aortic stenosis    a.) TTE 08/17/2016: EF >55%, mild AS (MPG 29.5); b.) TTE 08/25/2018: EF >55%; mod AS (MPG 42.3); c.) TTE 06/17/2020: EF >55%; severe AS (MPG 47.2); d.) s/p TAVR 08/31/2020 at [facility] - 23 mm Edwards Sapien 3 Ultra valve placed via a RIGHT percutaneous femoral approach --> complicated by intra/postoperative CHB requiring TVP and ultimate PPM placement   Arthritis    Basal cell carcinoma (BCC) of back    Bilateral leg edema    Breast cancer, right (HCC) 2011   a.) stage Ia IMC (ER/PR +, HER2/neu -); b.) lumpectomy + XRT in 2011; c.) completed 5 years AI therapy (anastrozole) in 11/2014   Cervical spinal stenosis    Cervical spondylosis    Chemotherapy induced nausea and vomiting    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   a.) TVP placed intraoperatively during TAVR secondary to development of CHB; b.) Medtronic PPM placed 09/03/2020 for postoperative continued postoperative CHB   Coronary artery disease    a.) RHC 08/11/2020: no sig CAD   DDD (degenerative disc disease), cervical    Diastolic dysfunction    a.) TTE 08/17/2016: EF >55%, mild LVH, mild LAE, triv panvalvular regurg, G1DD; b.) TTE  08/25/2018: EF >55%, mod LVH, mild LAE, triv AR/TR/PR, mild MR, G1dd; c.) TTE 08/11/2020: EF >55%, mild LVHH, mod LAE, triv MR/TR   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   Heart murmur    History of bilateral cataract extraction    Hyperplastic colon polyp    Hypertension    Hyperthyroidism    Neuropathy    Osteopenia    Personal history of other malignant neoplasm of large intestine 06/18/2020   PONV (postoperative nausea and vomiting)    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   a.) TVP placed intraoperatively during TAVR secondary to development of CHB; b.) Medtronic PPM placed 09/03/2020 for postoperative continued postoperative CHB   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   a.) s/p TAVR 08/31/2020 at [facility] - 23 mm Edwards Sapien 3 Ultra valve placed via a RIGHT percutaneous femoral approach --> complicated by intra/postoperative CHB requiring TVP and ultimate PPM placement   Thyroid  nodule 01/25/2022   a.) CT C-spine 01/25/2022: 22 mm right thyroid  nodule   Past Surgical History:  Procedure Laterality Date   BACK SURGERY     BREAST EXCISIONAL BIOPSY Right 03/2009   BREAST EXCISIONAL BIOPSY Bilateral    NEG   CATARACT EXTRACTION W/ INTRAOCULAR LENS  IMPLANT, BILATERAL     CHOLECYSTECTOMY     COLONOSCOPY WITH PROPOFOL  N/A 10/19/2019  Procedure: COLONOSCOPY WITH PROPOFOL -attempted, unable to perform poor prep;  Surgeon: Jinny Carmine, MD;  Location: [facility];  Service: Endoscopy;  Laterality: N/A;  priority 3   COLONOSCOPY WITH PROPOFOL  N/A 10/20/2019   Procedure: COLONOSCOPY WITH PROPOFOL ;  Surgeon: Jinny Carmine, MD;  Location: [facility] END[facility];  Service: Endoscopy;  Laterality: N/A;   COLONOSCOPY WITH PROPOFOL  N/A 11/16/2021   Procedure: COLONOSCOPY WITH PROPOFOL ;  Surgeon: Jinny Carmine, MD;  Location: [facility] ENDOSCOPY;  Service: Endoscopy;  Laterality: N/A;   ESOPHAGEAL DILATION  10/19/2019   Procedure: ESOPHAGEAL DILATION;  Surgeon: Jinny Carmine, MD;  Location: [facility] SURGERY [facility];  Service: Endoscopy;;   ESOPHAGOGASTRODUODENOSCOPY (EGD) WITH PROPOFOL  N/A 10/19/2019   Procedure: ESOPHAGOGASTRODUODENOSCOPY (EGD) WITH PROPOFOL ;  Surgeon: Jinny Carmine, MD;  Location: [facility] SURGERY [facility]TR;  Service: Endoscopy;  Laterality: N/A;   FLEXIBLE SIGMOIDOSCOPY N/A 03/20/2022   Procedure: FLEXIBLE SIGMOIDOSCOPY;  Surgeon: Jinny Carmine, MD;  Location: [facility] ENDOSCOPY;  Service: Endoscopy;  Laterality: N/A;   HEMORRHOID SURGERY     MASTECTOMY, PARTIAL Right 2010   PACEMAKER INSERTION N/A 09/03/2020   Procedure: PACEMAKER WITH LEFT BUNDLE LEAD (MEDTRONIC); Location: [facility]; Surgeon: Clem Repress, MD   [facility] REMOVAL  2021   PORTACATH PLACEMENT Right 11/23/2019   Procedure: INSERTION PORT-A-CATH;  Surgeon: Lane Shope, MD;  Location: [facility] ORS;  Service: General;  Laterality: Right;   RECTAL EXAM UNDER ANESTHESIA N/A 07/18/2022   Procedure: RECTAL EXAM UNDER ANESTHESIA;  Surgeon: Lane Shope, MD;  Location: [facility] ORS;  Service: General;  Laterality: N/A;   REVERSE SHOULDER ARTHROPLASTY Right 02/12/2023   Procedure: REVERSE SHOULDER ARTHROPLASTY;  Surgeon: Edie Norleen PARAS, MD;  Location: [facility] ORS;  Service: Orthopedics;  Laterality: Right;   RIGHT COLECTOMY Right 10/2019   Procedure: ROBOTIC ASSISTED TIGHT HEMICOLECTOMY   RIGHT/LEFT HEART CATH AND CORONARY ANGIOGRAPHY Right 08/11/2020   Procedure: RIGHT/LEFT HEART CATH AND CORONARY ANGIOGRAPHY; Location: [facility]; Surgeon: Krystal Apgar, MD   TRANSCATHETER AORTIC VALVE REPLACEMENT, TRANSFEMORAL Right 08/31/2020   Procedure: TRANSCATHETER AORTIC VALVE REPLACEMENT, RIGHT TRANSFEMORAL; Location: [facility]; Surgeon: Reyes Fruits, MD   TUMOR EXCISION N/A 04/11/2022   Procedure: TUMOR EXCISION RECTAL;  Surgeon: Lane Shope, MD;  Location: [facility] ORS;  Service: General;  Laterality: N/A;   Patient Active Problem List   Diagnosis Date Noted   Closed fracture dislocation of right shoulder 02/09/2023    Hyponatremia 02/08/2023   Closed fracture of right proximal humerus 02/07/2023   Closed fracture of right shoulder 02/07/2023   Fall at home, initial encounter 02/07/2023   Acute on chronic diastolic CHF (congestive heart failure) (HCC) 02/07/2023   Hypokalemia 02/07/2023   Obesity (BMI 30-39.9) 02/07/2023   Alcohol use 02/07/2023   History of colonic polyps    Rectal polyp    Personal history of colon cancer    Closed fracture of proximal end of left humerus 09/18/2021   Guillain Barr syndrome (HCC) 05/08/2021   Lumbar arthropathy 04/10/2021   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   Bilateral leg weakness 03/31/2021   Presence of permanent cardiac pacemaker 12/13/2020   Aortic atherosclerosis (HCC) 09/07/2020   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   Goals of care, counseling/discussion 11/19/2019   Status post laparoscopic colectomy 11/17/2019   Colon cancer (HCC) 10/28/2019   Iron deficiency anemia secondary to blood loss (chronic)    Benign neoplasm of cecum    Intestines neoplasm    Stricture and stenosis of esophagus    Advanced care planning/counseling discussion  07/22/2017   Hypercholesteremia 07/03/2016   Osteoporosis 07/03/2016   Carcinoma of right breast (HCC) 06/19/2016   Aortic stenosis, mild 05/02/2015   Hypertension 05/02/2015   Hyperthyroidism 05/02/2015   Breast cancer in female [facility]) 05/02/2015    ONSET DATE: 02/12/23  REFERRING DIAG: Fx of R 2nd digit middle phalanges with numbness R hand  THERAPY DIAG:  Stiffness of right hand, not elsewhere classified  Muscle weakness (generalized)  Numbness of right hand  Rationale for Evaluation and Treatment: Rehabilitation  SUBJECTIVE:   SUBJECTIVE STATEMENT: My daughter came the other day and helping with massaging the thumb.  I have been doing the stretches to my thumb.  Did not get a weight for my wrist.  I feel like the  numbness is getting little better. Pt accompanied by: self  PERTINENT HISTORY: Last Ortho visit - Dr Edie - JOEANNE ROBICHEAUX is a 83 y.o. female who presents for follow-up of a displaced P2 fracture of the right index finger which has been managed nonsurgically. She notes continued stiffness of the index finger, as well as some generalized weakness of her hand. She has been receiving formal therapy for her shoulder ( reverse shoulder arthroplasty 02/12/23), and states that she has not been receiving a little therapy as well for her hand. She denies any actual pain in the finger, and is not take any medications for discomfort. She is able to perform many of her normal daily activities without too much difficulty, and is sleeping well at night.  Her primary concern today is increased numbness/paresthesias to the right hand. She had been diagnosed with carpal tunnel syndrome by Gustavo Level, PA-C, at her last visit 1 month ago. He had advised that she undergo an EMG to look for evidence of carpal tunnel syndrome, but the patient states that she was never called with an appointment for this test. She notes that her symptoms are worse with repetitive grasping or holding activities, as well as at night   PRECAUTIONS: L  Shoulder    WEIGHT BEARING RESTRICTIONS: No  PAIN:  Are you having pain? No  FALLS: Has patient fallen in last 6 months? Yes. Number of falls 1  LIVING ENVIRONMENT: Lives with: lives alone  PLOF: Lives alone and likes to cook.  Likes to read and watch sports especially baseball.  Has somebody to come to clean the house-do my own laundry  PATIENT GOALS: Want to be able to use my hand.  And the numbness better  NEXT MD VISIT: 08/09/23  OBJECTIVE:  Note: Objective measures were completed at Evaluation unless otherwise noted.  HAND DOMINANCE: Right   UPPER EXTREMITY ROM:     AROM for wrist and forearm WFL - decrease strength but WFL   Shoulder NT - seen by PT for reverse  shoulder arthroplasty Active ROM Right eval Left eval R 07/18/23 R 07/23/23  Thumb MCP (0-60) 45  55 50  Thumb IP (0-80) 0  0   Thumb Radial abd/add (0-55) 40   45 50  Thumb Palmar abd/add (0-45) 45   40 50  Thumb Opposition to Small Finger Unable to do opposition to any digits       Index MCP (0-90) 55   60 75  Index PIP (0-100) 15      Index DIP (0-70)        Long MCP (0-90)  60   90 90  Long PIP (0-100)  90   95 90  Long DIP (0-70)  Ring MCP (0-90)  90   90 90  Ring PIP (0-100)  95   100 100  Ring DIP (0-70)        Little MCP (0-90)  90   90 90  Little PIP (0-100)  100   100 100  Little DIP (0-70)        (Blank rows = not tested)  HAND FUNCTION: Grip strength: Right: 5 lbs; Left: 40 lbs, Lateral pinch: Right: 2 lbs, Left: 9 lbs, and 3 point pinch: Right: UNABLE lbs, Left: 11 lbs Grip 8 lbs and lat inch 3 lbs - 07/18/23  COORDINATION: Unable to do any 2 or 3 point pinch - decrease sensation in thumb and index down to base of digit- and tip of 3rd -  07/25/23 able to do lat pinch to radial 2nd digit- but 2nd digit in ext at PIP and DIP   SENSATION: Numbness in thumb through third digit with thumb and second digits worse. 07/25/23 Numbness IP of thumb , whole 2nd digit, 3rd DIP  EDEMA: None noticed  COGNITION: Overall cognitive status: Within functional limits for tasks assessed  OBSERVATIONS:    TREATMENT DATE: 07/25/23       Assess progress in ROM and numbness- maintain progress in 2nd digit MC flexion and thumb PA and RA  Decrease  strength flexion of R wrist and pronation      Unable to maintain wrist in neutral with grip  assess- wrist in ext and sup unstable                                                                                                                      Modalities: paraffin  Time: 8 Location: coban flexion wrap to 2nd and 3rd digit hand  Decrease stiffness increase motion prior to review of home exercises  AGAIN Reviewed with patient  appropriate gripping and holding of objects. Keeping thumb out of the palm. Avoiding lateral pinch to third digit Using buddy strap with gripping activities to encourage motion to the second digit Enlarge grips  Cont with CMC neoprene splint to use during day and night to keep thumb CMC out of palm and CT     Pt to use Moist heat to 3 times a day prior to soft tissue massage to thumb webspace 10 reps to 15 reps Followed by carpal tunnel and metacarpal sprites  by OT  and if daughter can assist to open the carpal tunnel 15 reps Done as well as graston tool nr 2 brushing and sweeping over volar palm and thumb , 2nd and 3rd digit  Active assisted and passive range of motion for thumb palmar /radial abduction 12 reps Pt can do PROM with HEP Passive range of motion to thumb composite flexion 10 reps - PROM to base of 5th  Thumb IP numbness- no IP flexion AROM  Followed by opposition picking up 2 cm foam block alternating digits 10 reps- could do 3rd thru 5th- unable to flexion IP of 2nd  diti Passive range of motion to composite flexion for 2nd thru 5th  digits 10 reps Followed by placing hold composite gripping into 2 cm foam block 10 reps Focusing on 2nd digit -some PIP flexion today after paraffin and PROM    In past Reviewed with patient correct gripping of Thera-Band with therapy for shoulder to avoid wrist flexion thumb abduction and flexion into the carpal tunnel.  Change wrist and forearm HEP to yellow light flex bar  To use pronation - elbow to side inside of armrest to prevent ABD -and keep L still - pt fitted with wrist splint to wear to maintain wrist neutral - during attempt of pronation wrist push in extention  4 x 5 reps   R forearm on armrest - flex bar - keep R still -and grip and rotate into wrist flexion with gravity   5 x 5 reps Pt to focus flex in neutral - not UD   2-3  x day         PATIENT EDUCATION: Education details: findings of eval and HEP  Person  educated: Patient Education method: Explanation, Demonstration, Tactile cues, Verbal cues, and Handouts Education comprehension: verbalized understanding, returned demonstration, verbal cues required, and needs further education    GOALS: Goals reviewed with patient? Yes   LONG TERM GOALS: Target date: 6  Patient's thumb palmar radial abduction improved for patient to turn doorknob and pick up half full glass Baseline: Decreased thumb palmar radial abduction 40 to 45 degrees.  Unable to grasp around cylinder object, tight webspace Goal status: INITIAL  2.  Thumb flexion improved for patient to be able to do opposition to at least second and third digits to do 2 and 3-point pinch to pick up small objects. Baseline: Patient unable to do opposition to any finger's.  Decreased flexion of thumb-doing mostly lateral pinch to PIP of  third digit Goal status: INITIAL  3.  R 2nd and 3rd digits flexion improve for patient to pick up objects using 3-point pinch as well as gripping around utensils Baseline: Right second digit into flexion 55 and PIP 15.  Third digit flexion MC 60 and PIP 90.  Cannot pick up any objects with a 3-point pinch or 2 point pinch cannot grip around cylinder objects using fifth and fourth digits Goal status: INITIAL  4.  Right grip and prehension strength improve with at least 3 to 5 pounds to be able to hold a half full glass, hold utensils, carry a plate and starting writing Baseline: Grip on the right 5 pounds in the left 40 pounds, lateral pinch 2 pounds in the right left 9 pounds and 3-point pinch unable to do on the right and the left 11 pounds Goal status: INITIAL   ASSESSMENT:  CLINICAL IMPRESSION: Patient seen right hand second digit middle phalanges fracture that occurred 02/12/2023.  Patient right-hand-dominant.  At that time she also fractured shoulder that resulted in a reverse shoulder arthroplasty done by Dr.Poggi-patient has been receiving physical therapy  for the shoulder.  Patient referred for stiffness in the right dominant hand mostly in the second digit but also in thumb and third digit.  Appear patient has carpal tunnel symptoms. Numbness in the thumb IP and  2nd  digit - with 3rd at DIP.  Patient had a nerve conduction test scheduled since last time - per pt Dr Maree said she has CT -Pt this week with increase flexion of 2nd digit MC , and ability to do lat pinch to  side of 2nd digit - increase thumb PA and RA - Pt  cont to show increased stiffness in the right hand.  Patient gripping objects more with fourth and fifth  digits. Was pinching with the thumb in a lateral pinch to third PIP.  Causing  thumb CMC dropping into the carpal tunnel.  Reinforced with patient to keep thumb out of the palm ,working and addressing passive range of motion for thumb palmar /radial abduction as well as second and third digit. Pt to cont with buddy strap to assist in facilitating  flexion in between home exercises to 2nd digit.  Patient to focus on using lateral pinch of thumb in a normal pattern and 3-point pinch.  Add this date flex bar for pronation and wrist flexion strengthening.  Patient can benefit from skilled OT services to address soft tissue as well as increase motion and increase strength to return to prior level of function - as well as modifications to tasks to decrease symptoms and increase use.  PERFORMANCE DEFICITS: in functional skills including ADLs, IADLs, ROM, strength, pain, flexibility, decreased knowledge of use of DME, and UE functional use,   and psychosocial skills including environmental adaptation and routines and behaviors.   IMPAIRMENTS: are limiting patient from ADLs, IADLs, rest and sleep, play, leisure, and social participation.   COMORBIDITIES: has no other co-morbidities that affects occupational performance. Patient will benefit from skilled OT to address above impairments and improve overall function.  MODIFICATION OR ASSISTANCE TO  COMPLETE EVALUATION: No modification of tasks or assist necessary to complete an evaluation.  OT OCCUPATIONAL PROFILE AND HISTORY: Problem focused assessment: Including review of records relating to presenting problem.  CLINICAL DECISION MAKING: LOW - limited treatment options, no task modification necessary  REHAB POTENTIAL: Good for goals  EVALUATION COMPLEXITY: Low    PLAN:  OT FREQUENCY: 1-2x/week  OT DURATION: 6 weeks  PLANNED INTERVENTIONS: 97535 self care/ADL training, 02889 therapeutic exercise, 97140 manual therapy, 97018 paraffin, 02960 fluidotherapy, passive range of motion, patient/family education, and DME and/or AE instructions    CONSULTED AND AGREED WITH PLAN OF CARE: Patient    Ancel Peters, OTR/L,CLT 07/25/2023, 11:30 AM

## 2023-07-30 ENCOUNTER — Ambulatory Visit: Payer: Medicare Other | Admitting: Occupational Therapy

## 2023-07-30 DIAGNOSIS — R200 Anesthesia of skin: Secondary | ICD-10-CM

## 2023-07-30 DIAGNOSIS — M6281 Muscle weakness (generalized): Secondary | ICD-10-CM | POA: Diagnosis not present

## 2023-07-30 DIAGNOSIS — M25641 Stiffness of right hand, not elsewhere classified: Secondary | ICD-10-CM

## 2023-07-30 NOTE — Therapy (Signed)
OUTPATIENT OCCUPATIONAL THERAPY ORTHO TREATMENT  Patient Name: Maria Davies MRN: [id number] DOB:Jan 12, 1941, 83 y.o., female Today's Date: 07/30/2023  PCP: Dr Lynelle Smoke PROVIDER: Dr Joice Lofts  END OF SESSION:  OT End of Session - 07/30/23 1027     Visit Number 5    Number of Visits 12    Date for OT Re-Evaluation 08/26/23    OT Start Time 1015    OT Stop Time 1103    OT Time Calculation (min) 48 min    Activity Tolerance Patient tolerated treatment well    Behavior During Therapy [facility] for tasks assessed/performed             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   a.) stage IIIb; b.) RIGHT colon mass Bx (+) for invasive moderately differentiated adenocarcinoma (pT3 pN1a)   Anemia    Aortic atherosclerosis (HCC)    Aortic stenosis    a.) TTE 08/17/2016: EF >55%, mild AS (MPG 29.5); b.) TTE 08/25/2018: EF >55%; mod AS (MPG 42.3); c.) TTE 06/17/2020: EF >55%; severe AS (MPG 47.2); d.) s/p TAVR 08/31/2020 at [facility] - 23 mm Edwards Sapien 3 Ultra valve placed via a RIGHT percutaneous femoral approach --> complicated by intra/postoperative CHB requiring TVP and ultimate PPM placement   Arthritis    Basal cell carcinoma (BCC) of back    Bilateral leg edema    Breast cancer, right (HCC) 2011   a.) stage Ia IMC (ER/PR +, HER2/neu -); b.) lumpectomy + XRT in 2011; c.) completed 5 years AI therapy (anastrozole) in 11/2014   Cervical spinal stenosis    Cervical spondylosis    Chemotherapy induced nausea and vomiting    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   a.) TVP placed intraoperatively during TAVR secondary to development of CHB; b.) Medtronic PPM placed 09/03/2020 for postoperative continued postoperative CHB   Coronary artery disease    a.) RHC 08/11/2020: no sig CAD   DDD (degenerative disc disease), cervical    Diastolic dysfunction    a.) TTE 08/17/2016: EF >55%, mild LVH, mild LAE, triv panvalvular regurg, G1DD; b.) TTE  08/25/2018: EF >55%, mod LVH, mild LAE, triv AR/TR/PR, mild MR, G1dd; c.) TTE 08/11/2020: EF >55%, mild LVHH, mod LAE, triv MR/TR   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   Heart murmur    History of bilateral cataract extraction    Hyperplastic colon polyp    Hypertension    Hyperthyroidism    Neuropathy    Osteopenia    Personal history of other malignant neoplasm of large intestine 06/18/2020   PONV (postoperative nausea and vomiting)    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   a.) TVP placed intraoperatively during TAVR secondary to development of CHB; b.) Medtronic PPM placed 09/03/2020 for postoperative continued postoperative CHB   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   a.) s/p TAVR 08/31/2020 at [facility] - 23 mm Edwards Sapien 3 Ultra valve placed via a RIGHT percutaneous femoral approach --> complicated by intra/postoperative CHB requiring TVP and ultimate PPM placement   Thyroid nodule 01/25/2022   a.) CT C-spine 01/25/2022: 22 mm right thyroid nodule   Past Surgical History:  Procedure Laterality Date   BACK SURGERY     BREAST EXCISIONAL BIOPSY Right 03/2009   BREAST EXCISIONAL BIOPSY Bilateral    NEG   CATARACT EXTRACTION W/ INTRAOCULAR LENS  IMPLANT, BILATERAL     CHOLECYSTECTOMY     COLONOSCOPY WITH PROPOFOL N/A 10/19/2019  Procedure: COLONOSCOPY WITH PROPOFOL-attempted, unable to perform poor prep;  Surgeon: Midge Minium, MD;  Location: [facility] SURGERY [facility];  Service: Endoscopy;  Laterality: N/A;  priority 3   COLONOSCOPY WITH PROPOFOL N/A 10/20/2019   Procedure: COLONOSCOPY WITH PROPOFOL;  Surgeon: Midge Minium, MD;  Location: [facility] ENDOSCOPY;  Service: Endoscopy;  Laterality: N/A;   COLONOSCOPY WITH PROPOFOL N/A 11/16/2021   Procedure: COLONOSCOPY WITH PROPOFOL;  Surgeon: Midge Minium, MD;  Location: [facility] ENDOSCOPY;  Service: Endoscopy;  Laterality: N/A;   ESOPHAGEAL DILATION  10/19/2019   Procedure: ESOPHAGEAL DILATION;  Surgeon: Midge Minium, MD;  Location: [facility] SURGERY CNTR;  Service: Endoscopy;;   ESOPHAGOGASTRODUODENOSCOPY (EGD) WITH PROPOFOL N/A 10/19/2019   Procedure: ESOPHAGOGASTRODUODENOSCOPY (EGD) WITH PROPOFOL;  Surgeon: Midge Minium, MD;  Location: [facility] SURGERY CNTR;  Service: Endoscopy;  Laterality: N/A;   FLEXIBLE SIGMOIDOSCOPY N/A 03/20/2022   Procedure: FLEXIBLE SIGMOIDOSCOPY;  Surgeon: Midge Minium, MD;  Location: [facility] ENDOSCOPY;  Service: Endoscopy;  Laterality: N/A;   HEMORRHOID SURGERY     MASTECTOMY, PARTIAL Right 2010   PACEMAKER INSERTION N/A 09/03/2020   Procedure: PACEMAKER WITH LEFT BUNDLE LEAD (MEDTRONIC); Location: [facility]; Surgeon: Constance Haw, MD   [facility] REMOVAL  2021   PORTACATH PLACEMENT Right 11/23/2019   Procedure: INSERTION PORT-A-CATH;  Surgeon: Campbell Lerner, MD;  Location: [facility] ORS;  Service: General;  Laterality: Right;   RECTAL EXAM UNDER ANESTHESIA N/A 07/18/2022   Procedure: RECTAL EXAM UNDER ANESTHESIA;  Surgeon: Campbell Lerner, MD;  Location: [facility] ORS;  Service: General;  Laterality: N/A;   REVERSE SHOULDER ARTHROPLASTY Right 02/12/2023   Procedure: REVERSE SHOULDER ARTHROPLASTY;  Surgeon: Christena Flake, MD;  Location: [facility] ORS;  Service: Orthopedics;  Laterality: Right;   RIGHT COLECTOMY Right 10/2019   Procedure: ROBOTIC ASSISTED TIGHT HEMICOLECTOMY   RIGHT/LEFT HEART CATH AND CORONARY ANGIOGRAPHY Right 08/11/2020   Procedure: RIGHT/LEFT HEART CATH AND CORONARY ANGIOGRAPHY; Location: [facility]; Surgeon: Manson Allan, MD   TRANSCATHETER AORTIC VALVE REPLACEMENT, TRANSFEMORAL Right 08/31/2020   Procedure: TRANSCATHETER AORTIC VALVE REPLACEMENT, RIGHT TRANSFEMORAL; Location: [facility]; Surgeon: Clent Jacks, MD   TUMOR EXCISION N/A 04/11/2022   Procedure: TUMOR EXCISION RECTAL;  Surgeon: Campbell Lerner, MD;  Location: [facility] ORS;  Service: General;  Laterality: N/A;   Patient Active Problem List   Diagnosis Date Noted   Closed fracture dislocation of right shoulder 02/09/2023    Hyponatremia 02/08/2023   Closed fracture of right proximal humerus 02/07/2023   Closed fracture of right shoulder 02/07/2023   Fall at home, initial encounter 02/07/2023   Acute on chronic diastolic CHF (congestive heart failure) (HCC) 02/07/2023   Hypokalemia 02/07/2023   Obesity (BMI 30-39.9) 02/07/2023   Alcohol use 02/07/2023   History of colonic polyps    Rectal polyp    Personal history of colon cancer    Closed fracture of proximal end of left humerus 09/18/2021   Guillain Barr syndrome (HCC) 05/08/2021   Lumbar arthropathy 04/10/2021   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   Bilateral leg weakness 03/31/2021   Presence of permanent cardiac pacemaker 12/13/2020   Aortic atherosclerosis (HCC) 09/07/2020   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   Goals of care, counseling/discussion 11/19/2019   Status post laparoscopic colectomy 11/17/2019   Colon cancer (HCC) 10/28/2019   Iron deficiency anemia secondary to blood loss (chronic)    Benign neoplasm of cecum    Intestines neoplasm    Stricture and stenosis of esophagus    Advanced care planning/counseling discussion  07/22/2017   Hypercholesteremia 07/03/2016   Osteoporosis 07/03/2016   Carcinoma of right breast (HCC) 06/19/2016   Aortic stenosis, mild 05/02/2015   Hypertension 05/02/2015   Hyperthyroidism 05/02/2015   Breast cancer in female [facility]) 05/02/2015    ONSET DATE: 02/12/23  REFERRING DIAG: Fx of R 2nd digit middle phalanges with numbness R hand  THERAPY DIAG:  Stiffness of right hand, not elsewhere classified  Muscle weakness (generalized)  Numbness of right hand  Rationale for Evaluation and Treatment: Rehabilitation  SUBJECTIVE:   SUBJECTIVE STATEMENT: I can tell my hand is not as stiff and trusting it more- trying to use it more- numbness maybe little better- don't really know. Pt accompanied by: self  PERTINENT  HISTORY: Last Ortho visit - Dr Joice Lofts - YSIDRA SOPHER is a 83 y.o. female who presents for follow-up of a displaced P2 fracture of the right index finger which has been managed nonsurgically. She notes continued "stiffness" of the index finger, as well as some generalized weakness of her hand. She has been receiving formal therapy for her shoulder ( reverse shoulder arthroplasty 02/12/23), and states that she has not been receiving a little therapy as well for her hand. She denies any actual pain in the finger, and is not take any medications for discomfort. She is able to perform many of her normal daily activities without too much difficulty, and is sleeping well at night.  Her primary concern today is increased numbness/paresthesias to the right hand. She had been diagnosed with carpal tunnel syndrome by Horris Latino, PA-C, at her last visit 1 month ago. He had advised that she undergo an EMG to look for evidence of carpal tunnel syndrome, but the patient states that she was never called with an appointment for this test. She notes that her symptoms are worse with repetitive grasping or holding activities, as well as at night   PRECAUTIONS: L  Shoulder    WEIGHT BEARING RESTRICTIONS: No  PAIN:  Are you having pain? No  FALLS: Has patient fallen in last 6 months? Yes. Number of falls 1  LIVING ENVIRONMENT: Lives with: lives alone  PLOF: Lives alone and likes to cook.  Likes to read and watch sports especially baseball.  Has somebody to come to clean the house-do my own laundry  PATIENT GOALS: Want to be able to use my hand.  And the numbness better  NEXT MD VISIT: 08/09/23  OBJECTIVE:  Note: Objective measures were completed at Evaluation unless otherwise noted.  HAND DOMINANCE: Right   UPPER EXTREMITY ROM:     AROM for wrist and forearm WFL - decrease strength but WFL   Shoulder NT - seen by PT for reverse shoulder arthroplasty Active ROM Right eval Left eval R 07/18/23  R 07/23/23  Thumb MCP (0-60) 45  55 50  Thumb IP (0-80) 0  0   Thumb Radial abd/add (0-55) 40   45 50  Thumb Palmar abd/add (0-45) 45   40 50  Thumb Opposition to Small Finger Unable to do opposition to any digits       Index MCP (0-90) 55   60 75  Index PIP (0-100) 15      Index DIP (0-70)        Long MCP (0-90)  60   90 90  Long PIP (0-100)  90   95 90  Long DIP (0-70)        Ring MCP (0-90)  90   90 90  Ring PIP (  0-100)  95   100 100  Ring DIP (0-70)        Little MCP (0-90)  90   90 90  Little PIP (0-100)  100   100 100  Little DIP (0-70)        (Blank rows = not tested)  HAND FUNCTION: Grip strength: Right: 5 lbs; Left: 40 lbs, Lateral pinch: Right: 2 lbs, Left: 9 lbs, and 3 point pinch: Right: UNABLE lbs, Left: 11 lbs Grip 8 lbs and lat inch 3 lbs - 07/18/23  COORDINATION: Unable to do any 2 or 3 point pinch - decrease sensation in thumb and index down to base of digit- and tip of 3rd -  07/25/23 able to do lat pinch to radial 2nd digit- but 2nd digit in ext at PIP and DIP   SENSATION: Numbness in thumb through third digit with thumb and second digits worse. 07/25/23 Numbness IP of thumb , whole 2nd digit, 3rd DIP  EDEMA: None noticed  COGNITION: Overall cognitive status: Within functional limits for tasks assessed  OBSERVATIONS:    TREATMENT DATE: 07/30/23       Cont decrease  strength flexion of R wrist and pronation      Unable to maintain wrist in neutral with grip  assess- wrist in ext and sup unstable                                                                                                                      Modalities: paraffin  Time: 8 Location: coban flexion wrap to 2nd and 3rd digit hand  Decrease stiffness increase motion prior to review of home exercises  AGAIN Reviewed with patient appropriate gripping and holding of objects. Keeping thumb out of the palm. Avoiding lateral pinch to third digit Using buddy strap with gripping activities to  encourage motion to the second digit Enlarge grips  Cont with CMC neoprene splint to use during day and night to keep thumb CMC out of palm and CT     Pt to use Moist heat to 2 times a day prior to soft tissue massage to thumb webspace 10 reps to 15 reps Followed by carpal tunnel and metacarpal sprites  by OT  and if daughter can assist to open the carpal tunnel 15 reps   Active assisted and passive range of motion for thumb palmar /radial abduction 12 reps Pt can do PROM with HEP Passive range of motion to thumb composite flexion 10 reps - PROM to base of 5th  Thumb IP numbness- no IP flexion AROM Passive range of motion to composite flexion for 2nd thru 5th  digits 10 reps Followed by placing hold composite gripping into 2 cm foam block 10 reps Done  AAROM for gripping and place and hold into light blue putty -12 reps Attempted 3 point pinch - unable to do with 2nd digit Buddy strap - facilitate 3 point pinch with more than 50% assist from OT - for 3 point pinch clothingpin 4  lbs  8 reps fatigue Also pinching in 4 cm light ball with buddy strap on 4 lbs clothing pin - lat pinch 50% assist for positioning - 8 reps fatigue Focusing on 2nd digit -some PIP flexion today after paraffin and PROM     Review again  wrist and forearm HEP to yellow light flex bar  To use pronation - elbow to side inside of armrest to prevent ABD -and keep L still - pt fitted with wrist splint to wear to maintain wrist neutral - during attempt of pronation wrist push in extention  4 x 5 reps   R forearm on armrest - flex bar - keep R still -and grip and rotate into wrist flexion with gravity   5 x 5 reps Pt to focus flex in neutral - not UD   2-3  x day   BTE done D-ring for pronation with elbow to side Initially 3 pounds 100 seconds with placing hold last 20 seconds Second 120 seconds at 1 pound pronation  BTE 504 2 large screwdriver for wrist flexion-elbow to side 1 pound 80 seconds  And second time  100 seconds 1 pound Fatigue needs 25% assistance to keep elbow to side not compensating with tricep        PATIENT EDUCATION: Education details: findings of eval and HEP  Person educated: Patient Education method: Explanation, Demonstration, Tactile cues, Verbal cues, and Handouts Education comprehension: verbalized understanding, returned demonstration, verbal cues required, and needs further education    GOALS: Goals reviewed with patient? Yes   LONG TERM GOALS: Target date: 6  Patient's thumb palmar radial abduction improved for patient to turn doorknob and pick up half full glass Baseline: Decreased thumb palmar radial abduction 40 to 45 degrees.  Unable to grasp around cylinder object, tight webspace Goal status: INITIAL  2.  Thumb flexion improved for patient to be able to do opposition to at least second and third digits to do 2 and 3-point pinch to pick up small objects. Baseline: Patient unable to do opposition to any finger's.  Decreased flexion of thumb-doing mostly lateral pinch to PIP of  third digit Goal status: INITIAL  3.  R 2nd and 3rd digits flexion improve for patient to pick up objects using 3-point pinch as well as gripping around utensils Baseline: Right second digit into flexion 55 and PIP 15.  Third digit flexion MC 60 and PIP 90.  Cannot pick up any objects with a 3-point pinch or 2 point pinch cannot grip around cylinder objects using fifth and fourth digits Goal status: INITIAL  4.  Right grip and prehension strength improve with at least 3 to 5 pounds to be able to hold a half full glass, hold utensils, carry a plate and starting writing Baseline: Grip on the right 5 pounds in the left 40 pounds, lateral pinch 2 pounds in the right left 9 pounds and 3-point pinch unable to do on the right and the left 11 pounds Goal status: INITIAL   ASSESSMENT:  CLINICAL IMPRESSION: Patient seen right hand second digit middle phalanges fracture that occurred  02/12/2023.  Patient right-hand-dominant.  At that time she also fractured shoulder that resulted in a reverse shoulder arthroplasty done by Dr.Poggi-patient has been receiving physical therapy for the shoulder.  Patient referred for stiffness in the right dominant hand mostly in the second digit but also in thumb and third digit.  Appear patient has carpal tunnel symptoms. Numbness in the thumb IP and  2nd  digit -  with 3rd at DIP.  Patient had a nerve conduction test scheduled since last time - per pt Dr Sherryll Burger said she has CT - NOW pt did show since [facility] increase flexion of 2nd digit MC , and ability to do lat pinch to side of 2nd digit - increase thumb PA and RA - Pt  cont to show increased stiffness in the right hand. And numbness in 2nd worse than 3rd and thumb. Pt was prior to OT lat pinching to 3rd digit - dropping thumb thenar in CT - Pt to cont to keep thumb out of the palm ,working and addressing passive range of motion for thumb palmar /radial abduction as well as second and third digit flexion.  Pt to cont with buddy strap to assist in facilitating  flexion in between home exercises to 2nd digit.  Patient showed decreased strength in wrist flexion and pronation.  Started focusing on strengthening with home program as well as on BTE this date.  Patient can benefit from skilled OT services to address soft tissue as well as increase motion and increase strength to return to prior level of function - as well as modifications to tasks to decrease symptoms and increase use.  PERFORMANCE DEFICITS: in functional skills including ADLs, IADLs, ROM, strength, pain, flexibility, decreased knowledge of use of DME, and UE functional use,   and psychosocial skills including environmental adaptation and routines and behaviors.   IMPAIRMENTS: are limiting patient from ADLs, IADLs, rest and sleep, play, leisure, and social participation.   COMORBIDITIES: has no other co-morbidities that affects occupational  performance. Patient will benefit from skilled OT to address above impairments and improve overall function.  MODIFICATION OR ASSISTANCE TO COMPLETE EVALUATION: No modification of tasks or assist necessary to complete an evaluation.  OT OCCUPATIONAL PROFILE AND HISTORY: Problem focused assessment: Including review of records relating to presenting problem.  CLINICAL DECISION MAKING: LOW - limited treatment options, no task modification necessary  REHAB POTENTIAL: Good for goals  EVALUATION COMPLEXITY: Low    PLAN:  OT FREQUENCY: 1-2x/week  OT DURATION: 6 weeks  PLANNED INTERVENTIONS: 97535 self care/ADL training, 40981 therapeutic exercise, 97140 manual therapy, 97018 paraffin, 19147 fluidotherapy, passive range of motion, patient/family education, and DME and/or AE instructions    CONSULTED AND AGREED WITH PLAN OF CARE: Patient    Oletta Cohn, OTR/L,CLT 07/30/2023, 11:05 AM

## 2023-08-01 ENCOUNTER — Ambulatory Visit: Payer: Medicare Other | Admitting: Occupational Therapy

## 2023-08-01 DIAGNOSIS — M25641 Stiffness of right hand, not elsewhere classified: Secondary | ICD-10-CM | POA: Diagnosis not present

## 2023-08-01 DIAGNOSIS — R200 Anesthesia of skin: Secondary | ICD-10-CM

## 2023-08-01 DIAGNOSIS — M6281 Muscle weakness (generalized): Secondary | ICD-10-CM | POA: Diagnosis not present

## 2023-08-01 NOTE — Therapy (Signed)
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   a.) stage IIIb; b.) RIGHT colon mass Bx (+) for invasive moderately differentiated adenocarcinoma (pT3 pN1a)   Anemia    Aortic atherosclerosis (HCC)    Aortic stenosis    a.) TTE 08/17/2016: EF >55%, mild AS (MPG 29.5); b.) TTE 08/25/2018: EF >55%; mod AS (MPG 42.3); c.) TTE 06/17/2020: EF >55%; severe AS (MPG 47.2); d.) s/p TAVR 08/31/2020 at [facility] - 23 mm Edwards Sapien 3 Ultra valve placed via a RIGHT percutaneous femoral approach --> complicated by intra/postoperative CHB requiring TVP and ultimate PPM placement   Arthritis    Basal cell carcinoma (BCC) of back    Bilateral leg edema    Breast cancer, right (HCC) 2011   a.) stage Ia IMC (ER/PR +, HER2/neu -); b.) lumpectomy + XRT in 2011; c.) completed 5 years AI therapy (anastrozole) in 11/2014   Cervical spinal stenosis    Cervical spondylosis    Chemotherapy induced nausea and vomiting    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   a.) TVP placed intraoperatively during TAVR secondary to development of CHB; b.) Medtronic PPM placed 09/03/2020 for postoperative continued postoperative CHB   Coronary artery disease    a.) RHC 08/11/2020: no sig CAD   DDD (degenerative disc disease), cervical    Diastolic dysfunction    a.) TTE 08/17/2016: EF >55%, mild LVH, mild LAE, triv panvalvular regurg, G1DD; b.) TTE  08/25/2018: EF >55%, mod LVH, mild LAE, triv AR/TR/PR, mild MR, G1dd; c.) TTE 08/11/2020: EF >55%, mild LVHH, mod LAE, triv MR/TR   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   Heart murmur    History of bilateral cataract extraction    Hyperplastic colon polyp    Hypertension    Hyperthyroidism    Neuropathy    Osteopenia    Personal history of other malignant neoplasm of large intestine 06/18/2020   PONV (postoperative nausea and vomiting)    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   a.) TVP placed intraoperatively during TAVR secondary to development of CHB; b.) Medtronic PPM placed 09/03/2020 for postoperative continued postoperative CHB   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   a.) s/p TAVR 08/31/2020 at [facility] - 23 mm Edwards Sapien 3 Ultra valve placed via a RIGHT percutaneous femoral approach --> complicated by intra/postoperative CHB requiring TVP and ultimate PPM placement   Thyroid nodule 01/25/2022   a.) CT C-spine 01/25/2022: 22 mm right thyroid nodule   Past Surgical History:  Procedure Laterality Date   BACK SURGERY     BREAST EXCISIONAL BIOPSY Right 03/2009   BREAST EXCISIONAL BIOPSY Bilateral    NEG   CATARACT EXTRACTION W/ INTRAOCULAR LENS  IMPLANT, BILATERAL     CHOLECYSTECTOMY     COLONOSCOPY WITH PROPOFOL N/A 10/19/2019  Procedure: COLONOSCOPY WITH PROPOFOL-attempted, unable to perform poor prep;  Surgeon: Midge Minium, MD;  Location: [facility] SURGERY CNTR;  Service: Endoscopy;  Laterality: N/A;  priority 3   COLONOSCOPY WITH PROPOFOL N/A 10/20/2019   Procedure: COLONOSCOPY WITH PROPOFOL;  Surgeon: Midge Minium, MD;  Location: [facility] ENDOSCOPY;  Service: Endoscopy;  Laterality: N/A;   COLONOSCOPY WITH PROPOFOL N/A 11/16/2021   Procedure: COLONOSCOPY WITH PROPOFOL;  Surgeon: Midge Minium, MD;  Location: [facility] ENDOSCOPY;  Service: Endoscopy;  Laterality: N/A;   ESOPHAGEAL DILATION  10/19/2019   Procedure: ESOPHAGEAL DILATION;  Surgeon: Midge Minium, MD;  Location: [facility] SURGERY CNTR;  Service: Endoscopy;;   ESOPHAGOGASTRODUODENOSCOPY (EGD) WITH PROPOFOL N/A 10/19/2019   Procedure: ESOPHAGOGASTRODUODENOSCOPY (EGD) WITH PROPOFOL;  Surgeon: Midge Minium, MD;  Location: [facility] SURGERY CNTR;  Service: Endoscopy;  Laterality: N/A;   FLEXIBLE SIGMOIDOSCOPY N/A 03/20/2022   Procedure: FLEXIBLE SIGMOIDOSCOPY;  Surgeon: Midge Minium, MD;  Location: [facility] ENDOSCOPY;  Service: Endoscopy;  Laterality: N/A;   HEMORRHOID SURGERY     MASTECTOMY, PARTIAL Right 2010   PACEMAKER INSERTION N/A 09/03/2020   Procedure: PACEMAKER WITH LEFT BUNDLE LEAD (MEDTRONIC); Location: [facility]; Surgeon: Constance Haw, MD   [facility] REMOVAL  2021   PORTACATH PLACEMENT Right 11/23/2019   Procedure: INSERTION PORT-A-CATH;  Surgeon: Campbell Lerner, MD;  Location: [facility] ORS;  Service: General;  Laterality: Right;   RECTAL EXAM UNDER ANESTHESIA N/A 07/18/2022   Procedure: RECTAL EXAM UNDER ANESTHESIA;  Surgeon: Campbell Lerner, MD;  Location: [facility] ORS;  Service: General;  Laterality: N/A;   REVERSE SHOULDER ARTHROPLASTY Right 02/12/2023   Procedure: REVERSE SHOULDER ARTHROPLASTY;  Surgeon: Christena Flake, MD;  Location: [facility] ORS;  Service: Orthopedics;  Laterality: Right;   RIGHT COLECTOMY Right 10/2019   Procedure: ROBOTIC ASSISTED TIGHT HEMICOLECTOMY   RIGHT/LEFT HEART CATH AND CORONARY ANGIOGRAPHY Right 08/11/2020   Procedure: RIGHT/LEFT HEART CATH AND CORONARY ANGIOGRAPHY; Location: [facility]; Surgeon: Manson Allan, MD   TRANSCATHETER AORTIC VALVE REPLACEMENT, TRANSFEMORAL Right 08/31/2020   Procedure: TRANSCATHETER AORTIC VALVE REPLACEMENT, RIGHT TRANSFEMORAL; Location: [facility]; Surgeon: Clent Jacks, MD   TUMOR EXCISION N/A 04/11/2022   Procedure: TUMOR EXCISION RECTAL;  Surgeon: Campbell Lerner, MD;  Location: [facility] ORS;  Service: General;  Laterality: N/A;   Patient Active Problem List   Diagnosis Date Noted   Closed fracture dislocation of right shoulder 02/09/2023    Hyponatremia 02/08/2023   Closed fracture of right proximal humerus 02/07/2023   Closed fracture of right shoulder 02/07/2023   Fall at home, initial encounter 02/07/2023   Acute on chronic diastolic CHF (congestive heart failure) (HCC) 02/07/2023   Hypokalemia 02/07/2023   Obesity (BMI 30-39.9) 02/07/2023   Alcohol use 02/07/2023   History of colonic polyps    Rectal polyp    Personal history of colon cancer    Closed fracture of proximal end of left humerus 09/18/2021   Guillain Barr syndrome (HCC) 05/08/2021   Lumbar arthropathy 04/10/2021   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   Bilateral leg weakness 03/31/2021   Presence of permanent cardiac pacemaker 12/13/2020   Aortic atherosclerosis (HCC) 09/07/2020   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   Goals of care, counseling/discussion 11/19/2019   Status post laparoscopic colectomy 11/17/2019   Colon cancer (HCC) 10/28/2019   Iron deficiency anemia secondary to blood loss (chronic)    Benign neoplasm of cecum    Intestines neoplasm    Stricture and stenosis of esophagus    Advanced care planning/counseling discussion  07/22/2017   Hypercholesteremia 07/03/2016   Osteoporosis 07/03/2016   Carcinoma of right breast (HCC) 06/19/2016   Aortic stenosis, mild 05/02/2015   Hypertension 05/02/2015   Hyperthyroidism 05/02/2015   Breast cancer in female [facility]) 05/02/2015    ONSET DATE: 02/12/23  REFERRING DIAG: Fx of R 2nd digit middle phalanges with numbness R hand  THERAPY DIAG:  Muscle weakness (generalized)  Stiffness of right hand, not elsewhere classified  Numbness of right hand  Rationale for Evaluation and Treatment: Rehabilitation  SUBJECTIVE:   SUBJECTIVE STATEMENT: I can tell my hand is not as stiff and trusting it more- trying to use it more- numbness about the same -I forgot my exercises here last time  Pt accompanied by:  self  PERTINENT HISTORY: Last Ortho visit - Dr Joice Lofts - [employer] is a 83 y.o. female who presents for follow-up of a displaced P2 fracture of the right index finger which has been managed nonsurgically. She notes continued "stiffness" of the index finger, as well as some generalized weakness of her hand. She has been receiving formal therapy for her shoulder ( reverse shoulder arthroplasty 02/12/23), and states that she has not been receiving a little therapy as well for her hand. She denies any actual pain in the finger, and is not take any medications for discomfort. She is able to perform many of her normal daily activities without too much difficulty, and is sleeping well at night.  Her primary concern today is increased numbness/paresthesias to the right hand. She had been diagnosed with carpal tunnel syndrome by Horris Latino, PA-C, at her last visit 1 month ago. He had advised that she undergo an EMG to look for evidence of carpal tunnel syndrome, but the patient states that she was never called with an appointment for this test. She notes that her symptoms are worse with repetitive grasping or holding activities, as well as at night   PRECAUTIONS: L  Shoulder    WEIGHT BEARING RESTRICTIONS: No  PAIN:  Are you having pain? No  FALLS: Has patient fallen in last 6 months? Yes. Number of falls 1  LIVING ENVIRONMENT: Lives with: lives alone  PLOF: Lives alone and likes to cook.  Likes to read and watch sports especially baseball.  Has somebody to come to clean the house-do my own laundry  PATIENT GOALS: Want to be able to use my hand.  And the numbness better  NEXT MD VISIT: 08/09/23  OBJECTIVE:  Note: Objective measures were completed at Evaluation unless otherwise noted.  HAND DOMINANCE: Right   UPPER EXTREMITY ROM:     AROM for wrist and forearm WFL - decrease strength but WFL   Shoulder NT - seen by PT for reverse shoulder arthroplasty Active ROM Right eval Left eval  R 07/18/23 R 07/23/23  Thumb MCP (0-60) 45  55 50  Thumb IP (0-80) 0  0   Thumb Radial abd/add (0-55) 40   45 50  Thumb Palmar abd/add (0-45) 45   40 50  Thumb Opposition to Small Finger Unable to do opposition to any digits       Index MCP (0-90) 55   60 75  Index PIP (0-100) 15      Index DIP (0-70)        Long MCP (0-90)  60   90 90  Long PIP (0-100)  90   95 90  Long DIP (0-70)        Ring MCP (0-90)  90  90 90  Ring PIP (0-100)  95   100 100  Ring DIP (0-70)        Little MCP (0-90)  90   90 90  Little PIP (0-100)  100   100 100  Little DIP (0-70)        (Blank rows = not tested)  HAND FUNCTION: Grip strength: Right: 5 lbs; Left: 40 lbs, Lateral pinch: Right: 2 lbs, Left: 9 lbs, and 3 point pinch: Right: UNABLE lbs, Left: 11 lbs Grip 8 lbs and lat inch 3 lbs - 07/18/23  COORDINATION: Unable to do any 2 or 3 point pinch - decrease sensation in thumb and index down to base of digit- and tip of 3rd -  07/25/23 able to do lat pinch to radial 2nd digit- but 2nd digit in ext at PIP and DIP   SENSATION: Numbness in thumb through third digit with thumb and second digits worse. 07/25/23 Numbness IP of thumb , whole 2nd digit, 3rd DIP  EDEMA: None noticed  COGNITION: Overall cognitive status: Within functional limits for tasks assessed  OBSERVATIONS:    TREATMENT DATE: 08/01/23       Pt show increase R wrist flexion more than pronation this date - able to upgrade from YTB to GTB over arrest      - 4-/5 for wrist flexion Pronation still decrease 3+/5      Review again  wrist and forearm HEP to yellow light flex bar but change wrist flexion -to GTB 2 x 12 reps over armrest - wrist flexion -  To use pronation - elbow to side inside of armrest to prevent ABD -and keep L still - pt fitted with wrist splint to wear to maintain wrist neutral - during attempt of pronation wrist push in extention  10 reps -and 2nd time 4 reps- place and hold end range- fatigue  Pt compensate with shoulder  and triceps                                                                                                                Modalities: paraffin  Time: 8 Location: coban flexion wrap to 2nd and 3rd digit hand  Decrease stiffness increase motion prior to review of home exercises  AGAIN Reviewed with patient appropriate gripping and holding of objects. Keeping thumb out of the palm. Avoiding lateral pinch to third digit Using buddy strap with gripping activities to encourage motion to the second digit Enlarge grips  Cont with CMC neoprene splint to use during day and night to keep thumb CMC out of palm and CT     Pt to use Moist heat to 2 times a day prior to soft tissue massage to thumb webspace 10 reps to 15 reps Followed by carpal tunnel and metacarpal sprites  by OT  and if daughter can assist to open the carpal tunnel 15 reps   Active assisted and passive range of motion for thumb palmar /radial abduction 12 reps Pt can do PROM with HEP Passive range of  motion to thumb composite flexion 10 reps - PROM to base of 5th  Thumb IP numbness- no IP flexion AROM Passive range of motion to composite flexion for 2nd thru 5th  digits 10 reps Followed by placing hold composite gripping into 2 cm foam block 10 reps Done  AAROM for gripping and place and hold into light blue putty -12 reps  Buddy strap - facilitate 3 point pinch with more than 50% assist from OT - for 3 point pinch clothingpin 4 lbs  8 reps fatigue             PATIENT EDUCATION: Education details: findings of eval and HEP  Person educated: Patient Education method: Explanation, Demonstration, Tactile cues, Verbal cues, and Handouts Education comprehension: verbalized understanding, returned demonstration, verbal cues required, and needs further education    GOALS: Goals reviewed with patient? Yes   LONG TERM GOALS: Target date: 6  Patient's thumb palmar radial abduction improved for patient to turn  doorknob and pick up half full glass Baseline: Decreased thumb palmar radial abduction 40 to 45 degrees.  Unable to grasp around cylinder object, tight webspace Goal status: INITIAL  2.  Thumb flexion improved for patient to be able to do opposition to at least second and third digits to do 2 and 3-point pinch to pick up small objects. Baseline: Patient unable to do opposition to any finger's.  Decreased flexion of thumb-doing mostly lateral pinch to PIP of  third digit Goal status: INITIAL  3.  R 2nd and 3rd digits flexion improve for patient to pick up objects using 3-point pinch as well as gripping around utensils Baseline: Right second digit into flexion 55 and PIP 15.  Third digit flexion MC 60 and PIP 90.  Cannot pick up any objects with a 3-point pinch or 2 point pinch cannot grip around cylinder objects using fifth and fourth digits Goal status: INITIAL  4.  Right grip and prehension strength improve with at least 3 to 5 pounds to be able to hold a half full glass, hold utensils, carry a plate and starting writing Baseline: Grip on the right 5 pounds in the left 40 pounds, lateral pinch 2 pounds in the right left 9 pounds and 3-point pinch unable to do on the right and the left 11 pounds Goal status: INITIAL   ASSESSMENT:  CLINICAL IMPRESSION: Patient seen right hand second digit middle phalanges fracture that occurred 02/12/2023.  Patient right-hand-dominant.  At that time she also fractured shoulder that resulted in a reverse shoulder arthroplasty done by Dr.Poggi-patient has been receiving physical therapy for the shoulder.  Patient referred for stiffness in the right dominant hand mostly in the second digit but also in thumb and third digit.  Appear patient has carpal tunnel symptoms. Numbness in the thumb IP and  2nd  digit - with 3rd at DIP.  Patient had a nerve conduction test scheduled since last time - per pt Dr Sherryll Burger said she has CT - NOW pt did show since [facility] increase flexion of  2nd digit MC , and ability to do lat pinch to side of 2nd digit - increase thumb PA and RA - Pt  cont to show increased stiffness in the right hand. And numbness in 2nd worse than 3rd and thumb. Pt was prior to OT lat pinching to 3rd digit - dropping thumb thenar in CT - Pt to cont to keep thumb out of the palm ,working and addressing passive range of motion for thumb palmar /radial  abduction as well as second and third digit flexion.  Pt to cont with buddy strap to assist in facilitating  flexion in between home exercises to 2nd digit.  Patient showed decreased strength in wrist  pronation worse than flexion.  Started focusing on strengthening for wrist in clinic and HEP.  Patient can benefit from skilled OT services to address soft tissue as well as increase motion and increase strength to return to prior level of function - as well as modifications to tasks to decrease symptoms and increase use.  PERFORMANCE DEFICITS: in functional skills including ADLs, IADLs, ROM, strength, pain, flexibility, decreased knowledge of use of DME, and UE functional use,   and psychosocial skills including environmental adaptation and routines and behaviors.   IMPAIRMENTS: are limiting patient from ADLs, IADLs, rest and sleep, play, leisure, and social participation.   COMORBIDITIES: has no other co-morbidities that affects occupational performance. Patient will benefit from skilled OT to address above impairments and improve overall function.  MODIFICATION OR ASSISTANCE TO COMPLETE EVALUATION: No modification of tasks or assist necessary to complete an evaluation.  OT OCCUPATIONAL PROFILE AND HISTORY: Problem focused assessment: Including review of records relating to presenting problem.  CLINICAL DECISION MAKING: LOW - limited treatment options, no task modification necessary  REHAB POTENTIAL: Good for goals  EVALUATION COMPLEXITY: Low    PLAN:  OT FREQUENCY: 1-2x/week  OT DURATION: 6 weeks  PLANNED  INTERVENTIONS: 97535 self care/ADL training, 65784 therapeutic exercise, 97140 manual therapy, 97018 paraffin, 69629 fluidotherapy, passive range of motion, patient/family education, and DME and/or AE instructions    CONSULTED AND AGREED WITH PLAN OF CARE: Patient    Oletta Cohn, OTR/L,CLT 08/01/2023, 11:34 AM

## 2023-08-02 DIAGNOSIS — Z96611 Presence of right artificial shoulder joint: Secondary | ICD-10-CM | POA: Diagnosis not present

## 2023-08-02 DIAGNOSIS — M25511 Pain in right shoulder: Secondary | ICD-10-CM | POA: Diagnosis not present

## 2023-08-05 ENCOUNTER — Ambulatory Visit: Payer: Medicare Other | Admitting: Occupational Therapy

## 2023-08-05 DIAGNOSIS — M25641 Stiffness of right hand, not elsewhere classified: Secondary | ICD-10-CM

## 2023-08-05 DIAGNOSIS — E0590 Thyrotoxicosis, unspecified without thyrotoxic crisis or storm: Secondary | ICD-10-CM | POA: Diagnosis not present

## 2023-08-05 DIAGNOSIS — M6281 Muscle weakness (generalized): Secondary | ICD-10-CM

## 2023-08-05 DIAGNOSIS — R200 Anesthesia of skin: Secondary | ICD-10-CM

## 2023-08-05 NOTE — Therapy (Signed)
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   a.) stage IIIb; b.) RIGHT colon mass Bx (+) for invasive moderately differentiated adenocarcinoma (pT3 pN1a)   Anemia    Aortic atherosclerosis (HCC)    Aortic stenosis    a.) TTE 08/17/2016: EF >55%, mild AS (MPG 29.5); b.) TTE 08/25/2018: EF >55%; mod AS (MPG 42.3); c.) TTE 06/17/2020: EF >55%; severe AS (MPG 47.2); d.) s/p TAVR 08/31/2020 at [facility] - 23 mm Edwards Sapien 3 Ultra valve placed via a RIGHT percutaneous femoral approach --> complicated by intra/postoperative CHB requiring TVP and ultimate PPM placement   Arthritis    Basal cell carcinoma (BCC) of back    Bilateral leg edema    Breast cancer, right (HCC) 2011   a.) stage Ia IMC (ER/PR +, HER2/neu -); b.) lumpectomy + XRT in 2011; c.) completed 5 years AI therapy (anastrozole) in 11/2014   Cervical spinal stenosis    Cervical spondylosis    Chemotherapy induced nausea and vomiting    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   a.) TVP placed intraoperatively during TAVR secondary to development of CHB; b.) Medtronic PPM placed 09/03/2020 for postoperative continued postoperative CHB   Coronary artery disease    a.) RHC 08/11/2020: no sig CAD   DDD (degenerative disc disease), cervical    Diastolic dysfunction    a.) TTE 08/17/2016: EF >55%, mild LVH, mild LAE, triv panvalvular regurg, G1DD; b.) TTE 08/25/2018: EF >55%, mod LVH, mild LAE, triv AR/TR/PR, mild  MR, G1dd; c.) TTE 08/11/2020: EF >55%, mild LVHH, mod LAE, triv MR/TR   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   Heart murmur    History of bilateral cataract extraction    Hyperplastic colon polyp    Hypertension    Hyperthyroidism    Neuropathy    Osteopenia    Personal history of other malignant neoplasm of large intestine 06/18/2020   PONV (postoperative nausea and vomiting)    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   a.) TVP placed intraoperatively during TAVR secondary to development of CHB; b.) Medtronic PPM placed 09/03/2020 for postoperative continued postoperative CHB   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   a.) s/p TAVR 08/31/2020 at [facility] - 23 mm Edwards Sapien 3 Ultra valve placed via a RIGHT percutaneous femoral approach --> complicated by intra/postoperative CHB requiring TVP and ultimate PPM placement   Thyroid nodule 01/25/2022   a.) CT C-spine 01/25/2022: 22 mm right thyroid nodule   Past Surgical History:  Procedure Laterality Date   BACK SURGERY     BREAST EXCISIONAL BIOPSY Right 03/2009   BREAST EXCISIONAL BIOPSY Bilateral    NEG   CATARACT EXTRACTION W/ INTRAOCULAR LENS  IMPLANT, BILATERAL     CHOLECYSTECTOMY     COLONOSCOPY WITH PROPOFOL N/A 10/19/2019   Procedure: COLONOSCOPY WITH PROPOFOL-attempted, unable to perform poor prep;  Surgeon: Midge Minium, MD;  Location: [facility];  Service: Endoscopy;  Laterality: N/A;  priority 3   COLONOSCOPY WITH PROPOFOL N/A 10/20/2019   Procedure: COLONOSCOPY WITH PROPOFOL;  Surgeon: Midge Minium, MD;  Location: [facility] ENDOSCOPY;  Service: Endoscopy;  Laterality: N/A;   COLONOSCOPY WITH PROPOFOL N/A 11/16/2021   Procedure: COLONOSCOPY WITH PROPOFOL;  Surgeon: Midge Minium, MD;  Location: [facility] ENDOSCOPY;  Service: Endoscopy;  Laterality: N/A;   ESOPHAGEAL DILATION  10/19/2019   Procedure: ESOPHAGEAL DILATION;  Surgeon: Midge Minium, MD;  Location: [facility];  Service:  Endoscopy;;   ESOPHAGOGASTRODUODENOSCOPY (EGD) WITH PROPOFOL N/A 10/19/2019   Procedure: ESOPHAGOGASTRODUODENOSCOPY (EGD) WITH PROPOFOL;  Surgeon: Midge Minium, MD;  Location: [facility];  Service: Endoscopy;  Laterality: N/A;   FLEXIBLE SIGMOIDOSCOPY N/A 03/20/2022   Procedure: FLEXIBLE SIGMOIDOSCOPY;  Surgeon: Midge Minium, MD;  Location: [facility] ENDOSCOPY;  Service: Endoscopy;  Laterality: N/A;   HEMORRHOID SURGERY     MASTECTOMY, PARTIAL Right 2010   PACEMAKER INSERTION N/A 09/03/2020   Procedure: PACEMAKER WITH LEFT BUNDLE LEAD (MEDTRONIC); Location: [facility]; Surgeon: Constance Haw, MD   [facility] REMOVAL  2021   PORTACATH PLACEMENT Right 11/23/2019   Procedure: INSERTION PORT-A-CATH;  Surgeon: Campbell Lerner, MD;  Location: [facility];  Service: General;  Laterality: Right;   RECTAL EXAM UNDER ANESTHESIA N/A 07/18/2022   Procedure: RECTAL EXAM UNDER ANESTHESIA;  Surgeon: Campbell Lerner, MD;  Location: [facility];  Service: General;  Laterality: N/A;   REVERSE SHOULDER ARTHROPLASTY Right 02/12/2023   Procedure: REVERSE SHOULDER ARTHROPLASTY;  Surgeon: Christena Flake, MD;  Location: [facility];  Service: Orthopedics;  Laterality: Right;   RIGHT COLECTOMY Right 10/2019   Procedure: ROBOTIC ASSISTED TIGHT HEMICOLECTOMY   RIGHT/LEFT HEART CATH AND CORONARY ANGIOGRAPHY Right 08/11/2020   Procedure: RIGHT/LEFT HEART CATH AND CORONARY ANGIOGRAPHY; Location: [facility]; Surgeon: Manson Allan, MD   TRANSCATHETER AORTIC VALVE REPLACEMENT, TRANSFEMORAL Right 08/31/2020   Procedure: TRANSCATHETER AORTIC VALVE REPLACEMENT, RIGHT TRANSFEMORAL; Location: [facility]; Surgeon: Clent Jacks, MD   TUMOR EXCISION N/A 04/11/2022   Procedure: TUMOR EXCISION RECTAL;  Surgeon: Campbell Lerner, MD;  Location: [facility];  Service: General;  Laterality: N/A;   Patient Active Problem List   Diagnosis Date Noted   Closed fracture dislocation of right shoulder 02/09/2023   Hyponatremia 02/08/2023   Closed fracture of right  proximal humerus 02/07/2023   Closed fracture of right shoulder 02/07/2023   Fall at home, initial encounter 02/07/2023   Acute on chronic diastolic CHF (congestive heart failure) (HCC) 02/07/2023   Hypokalemia 02/07/2023   Obesity (BMI 30-39.9) 02/07/2023   Alcohol use 02/07/2023   History of colonic polyps    Rectal polyp    Personal history of colon cancer    Closed fracture of proximal end of left humerus 09/18/2021   Guillain Barr syndrome (HCC) 05/08/2021   Lumbar arthropathy 04/10/2021   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   Bilateral leg weakness 03/31/2021   Presence of permanent cardiac pacemaker 12/13/2020   Aortic atherosclerosis (HCC) 09/07/2020   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   Goals of care, counseling/discussion 11/19/2019   Status post laparoscopic colectomy 11/17/2019   Colon cancer (HCC) 10/28/2019   Iron deficiency anemia secondary to blood loss (chronic)    Benign neoplasm of cecum    Intestines neoplasm    Stricture and stenosis of esophagus    Advanced care planning/counseling discussion 07/22/2017   Hypercholesteremia 07/03/2016   Osteoporosis 07/03/2016   Carcinoma of right breast (  HCC) 06/19/2016   Aortic stenosis, mild 05/02/2015   Hypertension 05/02/2015   Hyperthyroidism 05/02/2015   Breast cancer in female [facility]) 05/02/2015    ONSET DATE: 02/12/23  REFERRING DIAG: Fx of R 2nd digit middle phalanges with numbness R hand  THERAPY DIAG:  Muscle weakness (generalized)  Stiffness of right hand, not elsewhere classified  Numbness of right hand  Rationale for Evaluation and Treatment: Rehabilitation  SUBJECTIVE:   SUBJECTIVE STATEMENT: I can tell my hand is not as stiff and trusting it more- I  think I burned my finger tip cooking- have this blister on tip of ring finger- that is the one I don't have feeling -like the green band exercise for my wrist  Pt  accompanied by: self  PERTINENT HISTORY: Last Ortho visit - Dr Joice Lofts - TAMMALA WEIDER is a 83 y.o. female who presents for follow-up of a displaced P2 fracture of the right index finger which has been managed nonsurgically. She notes continued "stiffness" of the index finger, as well as some generalized weakness of her hand. She has been receiving formal therapy for her shoulder ( reverse shoulder arthroplasty 02/12/23), and states that she has not been receiving a little therapy as well for her hand. She denies any actual pain in the finger, and is not take any medications for discomfort. She is able to perform many of her normal daily activities without too much difficulty, and is sleeping well at night.  Her primary concern today is increased numbness/paresthesias to the right hand. She had been diagnosed with carpal tunnel syndrome by Horris Latino, PA-C, at her last visit 1 month ago. He had advised that she undergo an EMG to look for evidence of carpal tunnel syndrome, but the patient states that she was never called with an appointment for this test. She notes that her symptoms are worse with repetitive grasping or holding activities, as well as at night   PRECAUTIONS: L  Shoulder    WEIGHT BEARING RESTRICTIONS: No  PAIN:  Are you having pain? No  FALLS: Has patient fallen in last 6 months? Yes. Number of falls 1  LIVING ENVIRONMENT: Lives with: lives alone  PLOF: Lives alone and likes to cook.  Likes to read and watch sports especially baseball.  Has somebody to come to clean the house-do my own laundry  PATIENT GOALS: Want to be able to use my hand.  And the numbness better  NEXT MD VISIT: 08/09/23  OBJECTIVE:  Note: Objective measures were completed at Evaluation unless otherwise noted.  HAND DOMINANCE: Right   UPPER EXTREMITY ROM:     AROM for wrist and forearm WFL - decrease strength but WFL   Shoulder NT - seen by PT for reverse shoulder arthroplasty Active ROM  Right eval Left eval R 07/18/23 R 07/23/23 R 08/05/23  Thumb MCP (0-60) 45  55 50 60  Thumb IP (0-80) 0  0  0  Thumb Radial abd/add (0-55) 40   45 50 40  Thumb Palmar abd/add (0-45) 45   40 50 52  Thumb Opposition to Small Finger Unable to do opposition to any digits        Index MCP (0-90) 55   60 75 75  Index PIP (0-100) 15     25  Index DIP (0-70)         Long MCP (0-90)  60   90 90 90  Long PIP (0-100)  90   95 90 80  Long DIP (0-70)  Ring MCP (0-90)  90   90 90 90  Ring PIP (0-100)  95   100 100 100  Ring DIP (0-70)         Little MCP (0-90)  90   90 90 90  Little PIP (0-100)  100   100 100 100  Little DIP (0-70)         (Blank rows = not tested)  HAND FUNCTION: Grip strength: Right: 5 lbs; Left: 40 lbs, Lateral pinch: Right: 2 lbs, Left: 9 lbs, and 3 point pinch: Right: UNABLE lbs, Left: 11 lbs Grip 8 lbs and lat inch 3 lbs - 07/18/23 08/05/23 Grip strength: Right: 10 lbs; Left: 40 lbs, Lateral pinch: Right: 4 base IP lbs, Left: 9 lbs, and 3 point pinch: Right: UNABLE lbs, Left: 11 lbs  COORDINATION: Unable to do any 2 or 3 point pinch - decrease sensation in thumb and index down to base of digit- and tip of 3rd -  07/25/23 able to do lat pinch to radial 2nd digit- but 2nd digit in ext at PIP and DIP   SENSATION: Numbness in thumb through third digit with thumb and second digits worse. 08/05/23 Numbness IP of thumb , middle and DIP 2nd digit, 3rd DIP  EDEMA: None noticed  COGNITION: Overall cognitive status: Within functional limits for tasks assessed  OBSERVATIONS:    TREATMENT DATE: 08/05/23       Pt showed increased right wrist flexion on the radial side 4 -/5.  Can resist OT. Also increased pronation 4 -/5 to midposition or neutral     Review again  wrist and forearm HEP continue and has great success with wrist flexion over armrest-doing GTB 2 x 12 reps over armrest - wrist flexion -  Change pronation to using yellow Thera-Band double not -with elbow to  side to prevent ABD and elbow extension-good to do 2 sets of 12 focusing on endrange pronation Patient encouraged to do at least 2 times a day prior to possible carpal tunnel surgery in the future.   Pt has been compensating shoulder and triceps for months  Assess patient's digit range of motion as well as grip and prehension strength and compared to start of care. See flowsheet. Grip increased to 10 pounds and lateral pinch 4 pounds-both doubled and strength Increased second and third digit MC flexion more than PIP                                                                                                                Moist heat paraffin  Time: 6 Location: Right wrist and hand  decrease stiffness increase motion prior to there ex  patient  to cont appropriate gripping and holding of objects. Keeping thumb out of the palm. Avoiding lateral pinch to third digit- LAT PINCH TO LATERAL 2nd Using buddy strap with gripping activities to encourage motion to the second digit Enlarge grips  Cont with CMC neoprene splint as needed to use during day and night to keep thumb CMC out of  palm and CT     Pt to use Moist heat to 2 times a day prior to soft tissue massage to thumb webspace 10 reps to 15 reps Followed by carpal tunnel and metacarpal sprites  by OT  and if daughter can assist to open the carpal tunnel 15 reps   Active assisted and passive range of motion for thumb palmar /radial abduction 12 reps Pt can do PROM with HEP Passive range of motion to thumb composite flexion 10 reps - PROM to base of 5th  Thumb IP numbness- no IP flexion AROM Passive range of motion to composite flexion for 2nd thru 3rd digits 10 reps Followed by placing hold composite gripping into 2 cm foam block 10 reps Done  AAROM for gripping and place and hold into light blue putty -12 reps  Buddy strap - facilitate 3 point pinch with more than 50% assist from OT - for 3 point pinch clothingpin 4 lbs  8  reps fatigue             PATIENT EDUCATION: Education details: findings of eval and HEP  Person educated: Patient Education method: Explanation, Demonstration, Tactile cues, Verbal cues, and Handouts Education comprehension: verbalized understanding, returned demonstration, verbal cues required, and needs further education    GOALS: Goals reviewed with patient? Yes   LONG TERM GOALS: Target date: 6  Patient's thumb palmar radial abduction improved for patient to turn doorknob and pick up half full glass Baseline: Decreased thumb palmar radial abduction 40 to 45 degrees.  Unable to grasp around cylinder object, tight webspace NOW PA increase to 50-55- increase functional use Goal status: MET  2.  Thumb flexion improved for patient to be able to do opposition to at least second and third digits to do 2 and 3-point pinch to pick up small objects. Baseline: Patient unable to do opposition to any finger's.  Decreased flexion of thumb-doing mostly lateral pinch to PIP of  third digit Goal status: INITIAL  3.  R 2nd and 3rd digits flexion improve for patient to pick up objects using 3-point pinch as well as gripping around utensils Baseline: Right second digit into flexion 55 and PIP 15.  Third digit flexion MC 60 and PIP 90.  Cannot pick up any objects with a 3-point pinch or 2 point pinch cannot grip around cylinder objects using fifth and fourth digits Goal status: INITIAL  4.  Right grip and prehension strength improve with at least 3 to 5 pounds to be able to hold a half full glass, hold utensils, carry a plate and starting writing Baseline: Grip on the right 5 pounds in the left 40 pounds, lateral pinch 2 pounds in the right left 9 pounds and 3-point pinch unable to do on the right and the left 11 pounds NOW GRIP R increase to 10 lbs and lat pinch 4 lbs  Goal status: MET   ASSESSMENT:  CLINICAL IMPRESSION: Patient seen right hand second digit middle phalanges fracture that  occurred 02/12/2023.  Patient right-hand-dominant.  At that time she also fractured shoulder that resulted in a reverse shoulder arthroplasty done by Dr.Poggi-patient has been receiving physical therapy for the shoulder.  Patient referred for stiffness in the right dominant hand mostly in the second digit but also in thumb and third digit.  Appear patient has carpal tunnel symptoms. Numbness in the thumb IP and  2nd  digit - with 3rd at DIP.  Patient had a nerve conduction test scheduled since last time -  per pt Dr Sherryll Burger said she has CT - NOW pt show compare to [facility] increase flexion of 2nd and 3rd  digits MC flexion more than PIP, and ability to do lat pinch to side of 2nd digit  with increase lat pinch by 2 lbs - increase thumb PA more than  RA - cont with PROM - Cont numbness in 2nd worse than 3rd and thumb. Pt was prior to OT lat pinching to 3rd digit - dropping thumb thenar in CT - Pt doing better job keeping thumb out of the palm ,working and addressing passive range of motion for thumb palmar /radial abduction as well as second and third digit flexion.  Pt to cont with buddy strap to assist in facilitating  flexion in between home exercises to 2nd digit. Pt show this date increase strength in R wrist flexion and pronation compare to the last 2 wks - pt to cont with strengthening prior to future possible surgery.  Patient cont to show decreased strength in wrist  flexion and pronation-  Started focusing on strengthening for wrist  the last 2-3 wks in clinic and HEP.  Patient can benefit from skilled OT services to address soft tissue as well as increase motion and increase strength to return to prior level of function - as well as modifications to tasks to decrease symptoms and increase use.  PERFORMANCE DEFICITS: in functional skills including ADLs, IADLs, ROM, strength, pain, flexibility, decreased knowledge of use of DME, and UE functional use,   and psychosocial skills including environmental adaptation and  routines and behaviors.   IMPAIRMENTS: are limiting patient from ADLs, IADLs, rest and sleep, play, leisure, and social participation.   COMORBIDITIES: has no other co-morbidities that affects occupational performance. Patient will benefit from skilled OT to address above impairments and improve overall function.  MODIFICATION OR ASSISTANCE TO COMPLETE EVALUATION: No modification of tasks or assist necessary to complete an evaluation.  OT OCCUPATIONAL PROFILE AND HISTORY: Problem focused assessment: Including review of records relating to presenting problem.  CLINICAL DECISION MAKING: LOW - limited treatment options, no task modification necessary  REHAB POTENTIAL: Good for goals  EVALUATION COMPLEXITY: Low    PLAN:  OT FREQUENCY: 1-2x/week  OT DURATION: 6 weeks  PLANNED INTERVENTIONS: 97535 self care/ADL training, 16109 therapeutic exercise, 97140 manual therapy, 97018 paraffin, 60454 fluidotherapy, passive range of motion, patient/family education, and DME and/or AE instructions    CONSULTED AND AGREED WITH PLAN OF CARE: Patient    Oletta Cohn, OTR/L,CLT 08/05/2023, 9:02 AM

## 2023-08-06 DIAGNOSIS — M25511 Pain in right shoulder: Secondary | ICD-10-CM | POA: Diagnosis not present

## 2023-08-06 DIAGNOSIS — Z96611 Presence of right artificial shoulder joint: Secondary | ICD-10-CM | POA: Diagnosis not present

## 2023-08-08 ENCOUNTER — Ambulatory Visit: Payer: Medicare Other | Admitting: Occupational Therapy

## 2023-08-10 ENCOUNTER — Other Ambulatory Visit: Payer: Self-pay | Admitting: Family Medicine

## 2023-08-12 DIAGNOSIS — E0590 Thyrotoxicosis, unspecified without thyrotoxic crisis or storm: Secondary | ICD-10-CM | POA: Diagnosis not present

## 2023-08-13 NOTE — Telephone Encounter (Signed)
 Requested Prescriptions  Pending Prescriptions Disp Refills   hydrochlorothiazide (HYDRODIURIL) 25 MG tablet [Pharmacy Med Name: HYDROCHLOROTHIAZIDE 25 MG TAB] 90 tablet 1    Sig: TAKE 1 TABLET BY MOUTH ONCE DAILY     Cardiovascular: Diuretics - Thiazide Passed - 08/13/2023  8:20 AM      Passed - Cr in normal range and within 180 days    Creatinine  Date Value Ref Range Status  08/15/2012 0.65 0.60 - 1.30 mg/dL Final   Creatinine, Ser  Date Value Ref Range Status  07/05/2023 0.70 0.57 - 1.00 mg/dL Final         Passed - K in normal range and within 180 days    Potassium  Date Value Ref Range Status  07/05/2023 3.7 3.5 - 5.2 mmol/L Final  08/15/2012 4.3 3.5 - 5.1 mmol/L Final         Passed - Na in normal range and within 180 days    Sodium  Date Value Ref Range Status  07/05/2023 135 134 - 144 mmol/L Final  08/15/2012 141 136 - 145 mmol/L Final         Passed - Last BP in normal range    BP Readings from Last 1 Encounters:  07/05/23 105/62         Passed - Valid encounter within last 6 months    Recent Outpatient Visits           1 month ago Primary hypertension   [place] [facility] Jackolyn Confer, MD   3 months ago Aortic atherosclerosis [facility])   [place] [facility] [place], Megan P, DO   8 months ago Abscess   [place] [facility] Larae Grooms, NP   9 months ago Hypercholesteremia   [place] [facility] [place], Megan P, DO   1 year ago Primary hypertension   [place] [facility] [place], [place], DO

## 2023-08-15 ENCOUNTER — Ambulatory Visit: Payer: Medicare Other | Admitting: Occupational Therapy

## 2023-08-15 DIAGNOSIS — R200 Anesthesia of skin: Secondary | ICD-10-CM | POA: Diagnosis not present

## 2023-08-15 DIAGNOSIS — M25641 Stiffness of right hand, not elsewhere classified: Secondary | ICD-10-CM | POA: Diagnosis not present

## 2023-08-15 DIAGNOSIS — M6281 Muscle weakness (generalized): Secondary | ICD-10-CM

## 2023-08-15 NOTE — Therapy (Signed)
 OUTPATIENT OCCUPATIONAL THERAPY ORTHO TREATMENT  Patient Name: Maria Davies MRN: [id number] DOB:Apr 01, 1941, 83 y.o., female Today's Date: 08/15/2023  PCP: Dr Lynelle Smoke PROVIDER: Dr Joice Lofts  END OF SESSION:  OT End of Session - 08/15/23 0936     Visit Number 8    Number of Visits 12    Date for OT Re-Evaluation 08/26/23    OT Start Time 0940    OT Stop Time 1022    OT Time Calculation (min) 42 min    Activity Tolerance Patient tolerated treatment well    Behavior During Therapy [facility] for tasks assessed/performed             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   a.) stage IIIb; b.) RIGHT colon mass Bx (+) for invasive moderately differentiated adenocarcinoma (pT3 pN1a)   Anemia    Aortic atherosclerosis (HCC)    Aortic stenosis    a.) TTE 08/17/2016: EF >55%, mild AS (MPG 29.5); b.) TTE 08/25/2018: EF >55%; mod AS (MPG 42.3); c.) TTE 06/17/2020: EF >55%; severe AS (MPG 47.2); d.) s/p TAVR 08/31/2020 at [facility] - 23 mm Edwards Sapien 3 Ultra valve placed via a RIGHT percutaneous femoral approach --> complicated by intra/postoperative CHB requiring TVP and ultimate PPM placement   Arthritis    Basal cell carcinoma (BCC) of back    Bilateral leg edema    Breast cancer, right (HCC) 2011   a.) stage Ia IMC (ER/PR +, HER2/neu -); b.) lumpectomy + XRT in 2011; c.) completed 5 years AI therapy (anastrozole) in 11/2014   Cervical spinal stenosis    Cervical spondylosis    Chemotherapy induced nausea and vomiting    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   a.) TVP placed intraoperatively during TAVR secondary to development of CHB; b.) Medtronic PPM placed 09/03/2020 for postoperative continued postoperative CHB   Coronary artery disease    a.) RHC 08/11/2020: no sig CAD   DDD (degenerative disc disease), cervical    Diastolic dysfunction    a.) TTE 08/17/2016: EF >55%, mild LVH, mild LAE, triv panvalvular regurg, G1DD; b.) TTE  08/25/2018: EF >55%, mod LVH, mild LAE, triv AR/TR/PR, mild MR, G1dd; c.) TTE 08/11/2020: EF >55%, mild LVHH, mod LAE, triv MR/TR   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   Heart murmur    History of bilateral cataract extraction    Hyperplastic colon polyp    Hypertension    Hyperthyroidism    Neuropathy    Osteopenia    Personal history of other malignant neoplasm of large intestine 06/18/2020   PONV (postoperative nausea and vomiting)    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   a.) TVP placed intraoperatively during TAVR secondary to development of CHB; b.) Medtronic PPM placed 09/03/2020 for postoperative continued postoperative CHB   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   a.) s/p TAVR 08/31/2020 at [facility] - 23 mm Edwards Sapien 3 Ultra valve placed via a RIGHT percutaneous femoral approach --> complicated by intra/postoperative CHB requiring TVP and ultimate PPM placement   Thyroid nodule 01/25/2022   a.) CT C-spine 01/25/2022: 22 mm right thyroid nodule   Past Surgical History:  Procedure Laterality Date   BACK SURGERY     BREAST EXCISIONAL BIOPSY Right 03/2009   BREAST EXCISIONAL BIOPSY Bilateral    NEG   CATARACT EXTRACTION W/ INTRAOCULAR LENS  IMPLANT, BILATERAL     CHOLECYSTECTOMY     COLONOSCOPY WITH PROPOFOL N/A 10/19/2019  Procedure: COLONOSCOPY WITH PROPOFOL-attempted, unable to perform poor prep;  Surgeon: Midge Minium, MD;  Location: [facility] SUR[facility];  Service: Endoscopy;  Laterality: N/A;  priority 3   COLONOSCOPY WITH PROPOFOL N/A 10/20/2019   Procedure: COLONOSCOPY WITH PROPOFOL;  Surgeon: Midge Minium, MD;  Location: [facility] ENDOSCOPY;  Service: Endoscopy;  Laterality: N/A;   COLONOSCOPY WITH PROPOFOL N/A 11/16/2021   Procedure: COLONOSCOPY WITH PROPOFOL;  Surgeon: Midge Minium, MD;  Location: [facility] ENDOSCOPY;  Service: Endoscopy;  Laterality: N/A;   ESOPHAGEAL DILATION  10/19/2019   Procedure: ESOPHAGEAL DILATION;  Surgeon: Midge Minium, MD;  Location: [facility];  Service: Endoscopy;;   ESOPHAGOGASTRODUODENOSCOPY (EGD) WITH PROPOFOL N/A 10/19/2019   Procedure: ESOPHAGOGASTRODUODENOSCOPY (EGD) WITH PROPOFOL;  Surgeon: Midge Minium, MD;  Location: [facility] SURGERY [facility];  Service: Endoscopy;  Laterality: N/A;   FLEXIBLE SIGMOIDOSCOPY N/A 03/20/2022   Procedure: FLEXIBLE SIGMOIDOSCOPY;  Surgeon: Midge Minium, MD;  Location: [facility] ENDOSCOPY;  Service: Endoscopy;  Laterality: N/A;   HEMORRHOID SURGERY     MASTECTOMY, PARTIAL Right 2010   PACEMAKER INSERTION N/A 09/03/2020   Procedure: PACEMAKER WITH LEFT BUNDLE LEAD (MEDTRONIC); Location: [facility]; Surgeon: Constance Haw, MD   [facility] REMOVAL  2021   PORTACATH PLACEMENT Right 11/23/2019   Procedure: INSERTION PORT-A-CATH;  Surgeon: Campbell Lerner, MD;  Location: [facility] ORS;  Service: General;  Laterality: Right;   RECTAL EXAM UNDER ANESTHESIA N/A 07/18/2022   Procedure: RECTAL EXAM UNDER ANESTHESIA;  Surgeon: Campbell Lerner, MD;  Location: [facility] ORS;  Service: General;  Laterality: N/A;   REVERSE SHOULDER ARTHROPLASTY Right 02/12/2023   Procedure: REVERSE SHOULDER ARTHROPLASTY;  Surgeon: Christena Flake, MD;  Location: [facility] ORS;  Service: Orthopedics;  Laterality: Right;   RIGHT COLECTOMY Right 10/2019   Procedure: ROBOTIC ASSISTED TIGHT HEMICOLECTOMY   RIGHT/LEFT HEART CATH AND CORONARY ANGIOGRAPHY Right 08/11/2020   Procedure: RIGHT/LEFT HEART CATH AND CORONARY ANGIOGRAPHY; Location: [facility]; Surgeon: Manson Allan, MD   TRANSCATHETER AORTIC VALVE REPLACEMENT, TRANSFEMORAL Right 08/31/2020   Procedure: TRANSCATHETER AORTIC VALVE REPLACEMENT, RIGHT TRANSFEMORAL; Location: [facility]; Surgeon: Clent Jacks, MD   TUMOR EXCISION N/A 04/11/2022   Procedure: TUMOR EXCISION RECTAL;  Surgeon: Campbell Lerner, MD;  Location: [facility] ORS;  Service: General;  Laterality: N/A;   Patient Active Problem List   Diagnosis Date Noted   Closed fracture dislocation of right shoulder 02/09/2023    Hyponatremia 02/08/2023   Closed fracture of right proximal humerus 02/07/2023   Closed fracture of right shoulder 02/07/2023   Fall at home, initial encounter 02/07/2023   Acute on chronic diastolic CHF (congestive heart failure) (HCC) 02/07/2023   Hypokalemia 02/07/2023   Obesity (BMI 30-39.9) 02/07/2023   Alcohol use 02/07/2023   History of colonic polyps    Rectal polyp    Personal history of colon cancer    Closed fracture of proximal end of left humerus 09/18/2021   Guillain Barr syndrome (HCC) 05/08/2021   Lumbar arthropathy 04/10/2021   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   Bilateral leg weakness 03/31/2021   Presence of permanent cardiac pacemaker 12/13/2020   Aortic atherosclerosis (HCC) 09/07/2020   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   Goals of care, counseling/discussion 11/19/2019   Status post laparoscopic colectomy 11/17/2019   Colon cancer (HCC) 10/28/2019   Iron deficiency anemia secondary to blood loss (chronic)    Benign neoplasm of cecum    Intestines neoplasm    Stricture and stenosis of esophagus    Advanced care planning/counseling discussion  07/22/2017   Hypercholesteremia 07/03/2016   Osteoporosis 07/03/2016   Carcinoma of right breast (HCC) 06/19/2016   Aortic stenosis, mild 05/02/2015   Hypertension 05/02/2015   Hyperthyroidism 05/02/2015   Breast cancer in female [facility]) 05/02/2015    ONSET DATE: 02/12/23  REFERRING DIAG: Fx of R 2nd digit middle phalanges with numbness R hand  THERAPY DIAG:  Muscle weakness (generalized)  Stiffness of right hand, not elsewhere classified  Numbness of right hand  Rationale for Evaluation and Treatment: Rehabilitation  SUBJECTIVE:   SUBJECTIVE STATEMENT: I did not see Dr.Poggi -I canceled because of snow.  I have appointment Monday.  I admit I did not really do my strengthening exercises for my wrist.  I feel like the  numbness in my fingers is not as constant anymore.  More pins-and-needles. Pt accompanied by: self  PERTINENT HISTORY: Last Ortho visit - Dr Joice Lofts - ARA GRANDMAISON is a 83 y.o. female who presents for follow-up of a displaced P2 fracture of the right index finger which has been managed nonsurgically. She notes continued "stiffness" of the index finger, as well as some generalized weakness of her hand. She has been receiving formal therapy for her shoulder ( reverse shoulder arthroplasty 02/12/23), and states that she has not been receiving a little therapy as well for her hand. She denies any actual pain in the finger, and is not take any medications for discomfort. She is able to perform many of her normal daily activities without too much difficulty, and is sleeping well at night.  Her primary concern today is increased numbness/paresthesias to the right hand. She had been diagnosed with carpal tunnel syndrome by Horris Latino, PA-C, at her last visit 1 month ago. He had advised that she undergo an EMG to look for evidence of carpal tunnel syndrome, but the patient states that she was never called with an appointment for this test. She notes that her symptoms are worse with repetitive grasping or holding activities, as well as at night   PRECAUTIONS: L  Shoulder    WEIGHT BEARING RESTRICTIONS: No  PAIN:  Are you having pain? No  FALLS: Has patient fallen in last 6 months? Yes. Number of falls 1  LIVING ENVIRONMENT: Lives with: lives alone  PLOF: Lives alone and likes to cook.  Likes to read and watch sports especially baseball.  Has somebody to come to clean the house-do my own laundry  PATIENT GOALS: Want to be able to use my hand.  And the numbness better  NEXT MD VISIT: 08/09/23  OBJECTIVE:  Note: Objective measures were completed at Evaluation unless otherwise noted.  HAND DOMINANCE: Right   UPPER EXTREMITY ROM:     AROM for wrist and forearm WFL - decrease strength but WFL    Shoulder NT - seen by PT for reverse shoulder arthroplasty Active ROM Right eval Left eval R 07/18/23 R 07/23/23 R 08/05/23 R 08/15/23  Thumb MCP (0-60) 45  55 50 60 60  Thumb IP (0-80) 0  0  0 0  Thumb Radial abd/add (0-55) 40   45 50 40 35  Thumb Palmar abd/add (0-45) 45   40 50 52 45  Thumb Opposition to Small Finger Unable to do opposition to any digits         Index MCP (0-90) 55   60 75 75 75  Index PIP (0-100) 15     25 25   Index DIP (0-70)          Long  MCP (0-90)  60   90 90 90 90  Long PIP (0-100)  90   95 90 80 80  Long DIP (0-70)          Ring MCP (0-90)  90   90 90 90 90  Ring PIP (0-100)  95   100 100 100 100  Ring DIP (0-70)          Little MCP (0-90)  90   90 90 90 90  Little PIP (0-100)  100   100 100 100 100  Little DIP (0-70)          (Blank rows = not tested)  HAND FUNCTION: Grip strength: Right: 5 lbs; Left: 40 lbs, Lateral pinch: Right: 2 lbs, Left: 9 lbs, and 3 point pinch: Right: UNABLE lbs, Left: 11 lbs Grip 8 lbs and lat inch 3 lbs - 07/18/23 08/05/23 Grip strength: Right: 10 lbs; Left: 40 lbs, Lateral pinch: Right: 4 base IP lbs, Left: 9 lbs, and 3 point pinch: Right: UNABLE lbs, Left: 11 lbs 08/15/23 Grip strength: Right: 10 lbs; Left: 40 lbs, Lateral pinch: Right: 4 base IP lbs, Left: 9 lbs, and 3 point pinch: Right: UNABLE lbs, Left: 11 lbs  COORDINATION: Unable to do any 2 or 3 point pinch - decrease sensation in thumb and index down to base of digit- and tip of 3rd -  07/25/23 able to do lat pinch to radial 2nd digit- but 2nd digit in ext at PIP and DIP   SENSATION: Numbness in thumb through third digit with thumb and second digits worse. 08/05/23 Numbness IP of thumb , middle and DIP 2nd digit, 3rd DIP  EDEMA: None noticed  COGNITION: Overall cognitive status: Within functional limits for tasks assessed  OBSERVATIONS:    TREATMENT DATE: 08/15/23       Compared to 10 days ago.  Patient showed decreased strength in pronation and wrist flexion  again. Appear patient did not do home exercises in the last 10 days. Patient also with decreased thumb palmar radial abduction.    Review and discussed with patient again importance of strengthening exercises prior to possible surgery for carpal tunnel.  Continue wrist and forearm HEP wrist flexion over armrest-doing GTB 2 x 12 reps over armrest - wrist flexion -   pronation to using yellow Thera-Band double not -with elbow to side to prevent ABD and elbow extension-good to do 2 sets of 12 focusing on endrange pronation Patient encouraged to do at least 2 times a day prior to possible carpal tunnel surgery in the future.   Pt has been compensating shoulder and triceps for months  Patient maintained since 10 days ago digit range of motion.  But did lose thumb palmar radial abduction.  See flowsheet. Maintained grip 10 pounds and lateral pinch 4 pounds-did double since start of care.  Also had increase second and third digit flexion compared to evaluation.  But maintaining now. On BTE at 1 pound forced pronation and wrist flexion.  Was able to do 120 seconds each . Attempted a second rotation 2 pounds - patient was unable to do 2 pounds.  Moist heat paraffin  Time: 6 Location: Right wrist and hand  decrease stiffness increase motion prior to there ex and soft tissue massage to thumb webspace  Patient to continue to Keeping thumb out of the palm. Avoiding lateral pinch to third digit- LAT PINCH TO LATERAL 2nd Using buddy strap with gripping activities to encourage motion to the second digit Enlarge grips  Cont with CMC neoprene splint as needed to use during day and night to keep thumb CMC out of palm and CT     Pt to use Moist heat to 2 times a day prior to soft tissue massage to thumb webspace 10 reps to 15 reps Followed by carpal tunnel and metacarpal spreads by OT  and if  daughter can assist to open the carpal tunnel 15 reps   Active assisted and passive range of motion for thumb palmar /radial abduction 12 reps Pt can do PROM with HEP Passive range of motion to thumb composite flexion 10 reps - PROM to base of 5th   Passive range of motion to composite flexion for 2nd thru fifth to palm 10 reps Followed by placing hold composite gripping to palm 10 reps Done  AAROM for gripping and place and hold into light blue putty -12 reps  Buddy strap - facilitate 3 point pinch with more than 50% assist from OT - for 3 point pinch clothingpin 4 lbs  8 reps fatigue             PATIENT EDUCATION: Education details: findings of eval and HEP  Person educated: Patient Education method: Explanation, Demonstration, Tactile cues, Verbal cues, and Handouts Education comprehension: verbalized understanding, returned demonstration, verbal cues required, and needs further education    GOALS: Goals reviewed with patient? Yes   LONG TERM GOALS: Target date: 6  Patient's thumb palmar radial abduction improved for patient to turn doorknob and pick up half full glass Baseline: Decreased thumb palmar radial abduction 40 to 45 degrees.  Unable to grasp around cylinder object, tight webspace NOW PA increase to 50-55- increase functional use Goal status: MET  2.  Thumb flexion improved for patient to be able to do opposition to at least second and third digits to do 2 and 3-point pinch to pick up small objects. Baseline: Patient unable to do opposition to any finger's.  Decreased flexion of thumb-doing mostly lateral pinch to PIP of  third digit Goal status: INITIAL  3.  R 2nd and 3rd digits flexion improve for patient to pick up objects using 3-point pinch as well as gripping around utensils Baseline: Right second digit into flexion 55 and PIP 15.  Third digit flexion MC 60 and PIP 90.  Cannot pick up any objects with a 3-point pinch or 2 point pinch cannot grip around  cylinder objects using fifth and fourth digits Goal status: INITIAL  4.  Right grip and prehension strength improve with at least 3 to 5 pounds to be able to hold a half full glass, hold utensils, carry a plate and starting writing Baseline: Grip on the right 5 pounds in the left 40 pounds, lateral pinch 2 pounds in the right left 9 pounds and 3-point pinch unable to do on the right and the left 11 pounds NOW GRIP R increase to 10 lbs and lat pinch 4 lbs  Goal status: MET   ASSESSMENT:  CLINICAL IMPRESSION: Patient seen right hand second digit middle phalanges fracture that occurred 02/12/2023.  Patient right-hand-dominant.  At that time she also fractured  shoulder that resulted in a reverse shoulder arthroplasty done by Dr.Poggi-patient has been receiving physical therapy for the shoulder.  Patient referred for stiffness in the right dominant hand mostly in the second digit but also in thumb and third digit.  Appear patient has carpal tunnel symptoms. Numbness in the thumb IP and  2nd  digit - with 3rd at DIP.  Patient had a nerve conduction test scheduled since last time - per pt Dr Sherryll Burger said she has CT - NOW pt show compare to [facility] increase flexion of 2nd and 3rd  digits MC flexion more than PIP, and ability to do lat pinch to side of 2nd digit  with increase lat pinch by 2 lbs - increase thumb PA more than  RA - cont with PROM - Cont numbness in 2nd worse than 3rd and thumb. Pt was prior to OT lat pinching to 3rd digit - dropping thumb thenar in CT - Pt doing better job keeping thumb out of the palm ,working and addressing passive range of motion for thumb palmar /radial abduction as well as second and third digit flexion.  Pt to cont with buddy strap to assist in facilitating  flexion in between home exercises to 2nd digit. Patient cont to show decreased strength in wrist  flexion and pronation-patient to continue strengthening for wrist  t prior to possible surgery for carpal tunnel.  Patient can  benefit from skilled OT services to address soft tissue as well as increase motion and increase strength to return to prior level of function - as well as modifications to tasks to decrease symptoms and increase use.  PERFORMANCE DEFICITS: in functional skills including ADLs, IADLs, ROM, strength, pain, flexibility, decreased knowledge of use of DME, and UE functional use,   and psychosocial skills including environmental adaptation and routines and behaviors.   IMPAIRMENTS: are limiting patient from ADLs, IADLs, rest and sleep, play, leisure, and social participation.   COMORBIDITIES: has no other co-morbidities that affects occupational performance. Patient will benefit from skilled OT to address above impairments and improve overall function.  MODIFICATION OR ASSISTANCE TO COMPLETE EVALUATION: No modification of tasks or assist necessary to complete an evaluation.  OT OCCUPATIONAL PROFILE AND HISTORY: Problem focused assessment: Including review of records relating to presenting problem.  CLINICAL DECISION MAKING: LOW - limited treatment options, no task modification necessary  REHAB POTENTIAL: Good for goals  EVALUATION COMPLEXITY: Low    PLAN:  OT FREQUENCY: 1-2x/week  OT DURATION: 6 weeks  PLANNED INTERVENTIONS: 97535 self care/ADL training, 54098 therapeutic exercise, 97140 manual therapy, 97018 paraffin, 11914 fluidotherapy, passive range of motion, patient/family education, and DME and/or AE instructions    CONSULTED AND AGREED WITH PLAN OF CARE: Patient    Oletta Cohn, OTR/L,CLT 08/15/2023, 10:31 AM

## 2023-08-16 DIAGNOSIS — M25511 Pain in right shoulder: Secondary | ICD-10-CM | POA: Diagnosis not present

## 2023-08-16 DIAGNOSIS — Z96611 Presence of right artificial shoulder joint: Secondary | ICD-10-CM | POA: Diagnosis not present

## 2023-08-19 DIAGNOSIS — G5601 Carpal tunnel syndrome, right upper limb: Secondary | ICD-10-CM | POA: Diagnosis not present

## 2023-08-19 DIAGNOSIS — S62620D Displaced fracture of medial phalanx of right index finger, subsequent encounter for fracture with routine healing: Secondary | ICD-10-CM | POA: Diagnosis not present

## 2023-09-23 ENCOUNTER — Other Ambulatory Visit: Payer: Self-pay | Admitting: Surgery

## 2023-09-24 ENCOUNTER — Encounter
Admission: RE | Admit: 2023-09-24 | Discharge: 2023-09-24 | Disposition: A | Discharge status: Home / Self Care | Source: Ambulatory Visit | Attending: Surgery | Admitting: Surgery

## 2023-09-24 ENCOUNTER — Other Ambulatory Visit: Payer: Self-pay

## 2023-09-24 VITALS — Ht 63.0 in | Wt 160.0 lb

## 2023-09-24 DIAGNOSIS — Z950 Presence of cardiac pacemaker: Secondary | ICD-10-CM

## 2023-09-24 DIAGNOSIS — E876 Hypokalemia: Secondary | ICD-10-CM

## 2023-09-24 DIAGNOSIS — I442 Atrioventricular block, complete: Secondary | ICD-10-CM

## 2023-09-24 DIAGNOSIS — I5033 Acute on chronic diastolic (congestive) heart failure: Secondary | ICD-10-CM

## 2023-09-24 DIAGNOSIS — Z952 Presence of prosthetic heart valve: Secondary | ICD-10-CM

## 2023-09-24 NOTE — Patient Instructions (Addendum)
 Your procedure is scheduled on: Wednesday 10/02/23 To find out your arrival time, please call [phone] between 1PM - 3PM on:   Tuesday 10/01/23 Report to the Registration Desk on the 1st floor of the [facility]. Free Valet parking is available.  If your arrival time is 6:00 am, do not arrive before that time as the [facility] entrance doors do not open until 6:00 am.  REMEMBER: Instructions that are not followed completely may result in serious medical risk, up to and including death; or upon the discretion of your surgeon and anesthesiologist your surgery may need to be rescheduled.  Do not eat food after midnight the night before surgery.  No gum chewing or hard candies.  You may however, drink CLEAR liquids up to 2 hours before you are scheduled to arrive for your surgery. Do not drink anything within 2 hours of your scheduled arrival time.  Clear liquids include: - water  - apple juice without pulp - gatorade (not RED colors) - black coffee or tea (Do NOT add milk or creamers to the coffee or tea) Do NOT drink anything that is not on this list.  Type 1 and Type 2 diabetics should only drink water.  In addition, your doctor has ordered for you to drink the provided:  Ensure Pre-Surgery Clear Carbohydrate Drink  Drinking this carbohydrate drink up to two hours before surgery helps to reduce insulin resistance and improve patient outcomes. Please complete drinking 2 hours before scheduled arrival time.  One week prior to surgery: Stop Anti-inflammatories (NSAIDS) such as Advil, Aleve, Ibuprofen, Motrin, Naproxen, Naprosyn and Aspirin based products such as Excedrin, Goody's Powder, BC Powder. You may however, continue to take Tylenol if needed for pain up until the day of surgery.  Stop ANY OVER THE COUNTER supplements until after surgery.  Continue taking all prescribed medications. You may continue your daily Aspirin but hold it the day of surgery.  TAKE ONLY THESE  MEDICATIONS THE MORNING OF SURGERY WITH A SIP OF WATER:  amLODipine (NORVASC) 5 MG tablet  metoprolol succinate (TOPROL-XL) 50 MG 24 hr tablet   No Alcohol for 24 hours before or after surgery.  No Smoking including e-cigarettes for 24 hours before surgery.  No chewable tobacco products for at least 6 hours before surgery.  No nicotine patches on the day of surgery.  Do not use any "recreational" drugs for at least a week (preferably 2 weeks) before your surgery.  Please be advised that the combination of cocaine and anesthesia may have negative outcomes, up to and including death. If you test positive for cocaine, your surgery will be cancelled.  On the morning of surgery brush your teeth with toothpaste and water, you may rinse your mouth with mouthwash if you wish. Do not swallow any toothpaste or mouthwash.  Use CHG Soap or wipes as directed on instruction sheet.  Do not wear lotions, powders, or perfumes.   Do not shave body hair from the neck down 48 hours before surgery.  Wear comfortable clothing (specific to your surgery type) to the hospital.  Do not wear jewelry, make-up, hairpins, clips or nail polish.  For welded (permanent) jewelry: bracelets, anklets, waist bands, etc.  Please have this removed prior to surgery.  If it is not removed, there is a chance that hospital personnel will need to cut it off on the day of surgery. Contact lenses, hearing aids and dentures may not be worn into surgery.  Do not bring valuables to the  hospital. [facility] is not responsible for any missing/lost belongings or valuables.   Notify your doctor if there is any change in your medical condition (cold, fever, infection).  If you are being discharged the day of surgery, you will not be allowed to drive home. You will need a responsible individual to drive you home and stay with you for 24 hours after surgery.   If you are taking public transportation, you will need to have a  responsible individual with you.  If you are being admitted to the hospital overnight, leave your suitcase in the car. After surgery it may be brought to your room.  In case of increased patient census, it may be necessary for you, the patient, to continue your postoperative care in the Same Day Surgery department.  After surgery, you can help prevent lung complications by doing breathing exercises.  Take deep breaths and cough every 1-2 hours. Your doctor may order a device called an Incentive Spirometer to help you take deep breaths. When coughing or sneezing, hold a pillow firmly against your incision with both hands. This is called "splinting." Doing this helps protect your incision. It also decreases belly discomfort.  Surgery Visitation Policy:  Patients undergoing a surgery or procedure may have two family members or support persons with them as long as the person is not COVID-19 positive or experiencing its symptoms.   Inpatient Visitation:    Visiting hours are 7 a.m. to 8 p.m. Up to four visitors are allowed at one time in a patient room. The visitors may rotate out with other people during the day. One designated support person (adult) may remain overnight.  Please call the Pre-admissions Testing Dept. at [phone] if you have any questions about these instructions.     Preparing for Surgery with CHLORHEXIDINE GLUCONATE (CHG) Soap  Chlorhexidine Gluconate (CHG) Soap  o An antiseptic cleaner that kills germs and bonds with the skin to continue killing germs even after washing  o Used for showering the night before surgery and morning of surgery  Before surgery, you can play an important role by reducing the number of germs on your skin.  CHG (Chlorhexidine gluconate) soap is an antiseptic cleanser which kills germs and bonds with the skin to continue killing germs even after washing.  Please do not use if you have an allergy to CHG or antibacterial soaps. If your  skin becomes reddened/irritated stop using the CHG.  1. Shower the NIGHT BEFORE SURGERY and the MORNING OF SURGERY with CHG soap.  2. If you choose to wash your hair, wash your hair first as usual with your normal shampoo.  3. After shampooing, rinse your hair and body thoroughly to remove the shampoo.  4. Use CHG as you would any other liquid soap. You can apply CHG directly to the skin and wash gently with a scrungie or a clean washcloth.  5. Apply the CHG soap to your body only from the neck down. Do not use on open wounds or open sores. Avoid contact with your eyes, ears, mouth, and genitals (private parts). Wash face and genitals (private parts) with your normal soap.  6. Wash thoroughly, paying special attention to the area where your surgery will be performed.  7. Thoroughly rinse your body with warm water.  8. Do not shower/wash with your normal soap after using and rinsing off the CHG soap.  9. Pat yourself dry with a clean towel.  10. Wear clean pajamas to bed the night  before surgery.  12. Place clean sheets on your bed the night of your first shower and do not sleep with pets.  13. Shower again with the CHG soap on the day of surgery prior to arriving at the hospital.  14. Do not apply any deodorants/lotions/powders.  15. Please wear clean clothes to the hospital.

## 2023-09-25 ENCOUNTER — Encounter: Payer: Self-pay | Admitting: Urgent Care

## 2023-09-25 ENCOUNTER — Encounter
Admission: RE | Admit: 2023-09-25 | Discharge: 2023-09-25 | Disposition: A | Discharge status: Home / Self Care | Source: Ambulatory Visit | Attending: Surgery | Admitting: Surgery

## 2023-09-25 ENCOUNTER — Telehealth: Payer: Self-pay | Admitting: Urgent Care

## 2023-09-25 DIAGNOSIS — Z01812 Encounter for preprocedural laboratory examination: Secondary | ICD-10-CM | POA: Diagnosis present

## 2023-09-25 DIAGNOSIS — Z952 Presence of prosthetic heart valve: Secondary | ICD-10-CM | POA: Insufficient documentation

## 2023-09-25 DIAGNOSIS — Z01818 Encounter for other preprocedural examination: Secondary | ICD-10-CM | POA: Diagnosis not present

## 2023-09-25 DIAGNOSIS — I442 Atrioventricular block, complete: Secondary | ICD-10-CM | POA: Diagnosis not present

## 2023-09-25 DIAGNOSIS — I5033 Acute on chronic diastolic (congestive) heart failure: Secondary | ICD-10-CM | POA: Diagnosis not present

## 2023-09-25 DIAGNOSIS — Z950 Presence of cardiac pacemaker: Secondary | ICD-10-CM | POA: Insufficient documentation

## 2023-09-25 DIAGNOSIS — Z79899 Other long term (current) drug therapy: Secondary | ICD-10-CM

## 2023-09-25 DIAGNOSIS — Z01810 Encounter for preprocedural cardiovascular examination: Secondary | ICD-10-CM | POA: Diagnosis not present

## 2023-09-25 DIAGNOSIS — E876 Hypokalemia: Secondary | ICD-10-CM | POA: Insufficient documentation

## 2023-09-25 LAB — BASIC METABOLIC PANEL WITH GFR
Anion gap: 9 (ref 5–15)
BUN: 17 mg/dL (ref 8–23)
CO2: 30 mmol/L (ref 22–32)
Calcium: 9.3 mg/dL (ref 8.9–10.3)
Chloride: 86 mmol/L — ABNORMAL LOW (ref 98–111)
Creatinine, Ser: 0.64 mg/dL (ref 0.44–1.00)
GFR, Estimated: >60 mL/min (ref >60)
Glucose, Bld: 125 mg/dL — ABNORMAL HIGH (ref 70–99)
Potassium: 3 mmol/L — ABNORMAL LOW (ref 3.5–5.1)
Sodium: 125 mmol/L — ABNORMAL LOW (ref 135–145)

## 2023-09-25 LAB — CBC
HCT: 38.6 % (ref 36.0–46.0)
Hemoglobin: 14.2 g/dL (ref 12.0–15.0)
MCH: 34.8 pg — ABNORMAL HIGH (ref 26.0–34.0)
MCHC: 36.8 g/dL — ABNORMAL HIGH (ref 30.0–36.0)
MCV: 94.6 fL (ref 80.0–100.0)
Platelets: 186 10*3/uL (ref 150–400)
RBC: 4.08 MIL/uL (ref 3.87–5.11)
RDW: 12.1 % (ref 11.5–15.5)
WBC: 5.9 10*3/uL (ref 4.0–10.5)
nRBC: 0 % (ref 0.0–0.2)

## 2023-09-25 MED ORDER — POTASSIUM CHLORIDE CRYS ER 20 MEQ PO TBCR: 20.0000 meq | EXTENDED_RELEASE_TABLET | Freq: Every day | ORAL | 0 refills | Status: DC

## 2023-09-25 NOTE — Progress Notes (Signed)
 [facility] Perioperative Services: Pre-Admission/Anesthesia Testing  Abnormal Lab Notification and Treatment Plan of Care   Date: 09/25/23  Name: Maria Davies MRN:   [id number]  Re: Abnormal labs noted during PAT appointment   Notified:  Provider Name Provider Role Notification Mode  Poggi, Jonny Ruiz, MD Orthopedics (Surgeon) Routed and/or faxed via Malka So, Oralia Rud, DO Primary Care Provider Routed and/or faxed via [facility]   Clinical Information and Notes:  ABNORMAL LAB VALUE(S): Lab Results  Component Value Date   K 3.0 (L) 09/25/2023   Maria Davies is scheduled for an elective RIGHT ENDOSCOPIC CARPAL TUNNEL RELEASE on 10/02/2023. In review of her medication reconciliation, it is noted that the patient is taking prescribed diuretic medications (HCTZ 25 mg) daily.   Please note, in efforts to promote a safe and effective anesthetic course, per current guidelines/standards set by the [facility] anesthesia team, the minimal acceptable K+ level for the patient to proceed with general anesthesia is 3.0 mmol/L. With that being said, if the patient drops any lower, her elective procedure will need to be postponed until K+ is better optimized. In efforts to prevent case cancellation, and ultimately to promote the safety of this patient undergoing sedation/anesthesia, will make efforts to optimize pre-surgical K+ level allowing the surgical intervention to proceed as planned.    Impression and Plan:  Maria Davies found to be HYPOkalemic at 3.0 mmol/L on preoperative labs.   She is on daily thiazide diuretic therapy. Patient does not take a daily K+ supplement. Discussed diuretic therapy as likely etiology in the absence of GI related symptoms (no diarrhea). Patient denies regular use of laxative medications. Reviewed other potential causes, including decreased intake of dietary K+ and other insensible losses. Reviewed plans for preoperative optimization as follows:    Meds ordered this encounter  Medications   potassium chloride SA (KLOR-CON M) 20 MEQ tablet    Sig: Take 1 tablet (20 mEq total) by mouth daily for 7 days.    Dispense:  7 tablet    Refill:  0    Please contact the patient as soon as it is available for pickup. Rx is for preoperative K+ optimization and needs to be started ASAP.   Encouraged patient to follow up with PCP about 2-3 weeks postoperatively to have labs rechecked to ensure that levels are remaining within normal range. Discussed nutritional intake of K+ rich foods as an adjunctive way to keep her K+ levels normal; list of K+ rich foods provided. Also mentioned ORS, however advised her not to rely solely on these drinks, as they are high in Na+, and she has a HTN diagnosis.   Will send copy of this note to surgeon and PCP to make them aware of K+ level and plans for correction. Discussed that PCP may elect to pursue a change in diuretic therapy to a K+ sparing type medication, or alternatively, they may consider adding a daily K+ supplement if levels remain low on recheck. Order entered to recheck K+ on the day of her surgery to ensure optimization. Wished patient the best of luck with her upcoming surgery and subsequent recovery. She was encouraged to return call to the PAT clinic, or to her surgeon's office, should any questions or concerns arise between now and the time of her surgery. Patient was appreciative of the care/concern expressed by PAT staff.   Encounter Diagnoses  Name Primary?   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 Despite my best ability to proofread, there is always the potential that unintentional transcriptional errors may  still occur from this process.

## 2023-09-26 ENCOUNTER — Encounter: Payer: Self-pay | Admitting: Surgery

## 2023-09-26 ENCOUNTER — Ambulatory Visit (INDEPENDENT_AMBULATORY_CARE_PROVIDER_SITE_OTHER): Payer: Self-pay | Admitting: Emergency Medicine

## 2023-09-26 VITALS — Ht 63.0 in | Wt 160.0 lb

## 2023-09-26 DIAGNOSIS — Z1231 Encounter for screening mammogram for malignant neoplasm of breast: Secondary | ICD-10-CM

## 2023-09-26 DIAGNOSIS — Z0000 Encounter for general adult medical examination without abnormal findings: Secondary | ICD-10-CM | POA: Diagnosis not present

## 2023-09-26 NOTE — Progress Notes (Signed)
 Perioperative / Anesthesia Services  Pre-Admission Testing Clinical Review / Pre-Operative Anesthesia Consult  Date: 09/26/23  Patient Demographics:  Name: Maria Davies DOB: [birth year] MRN:   [id number]  Planned Surgical Procedure(s):    Case: [id number] Date/Time: 10/02/23 1057   Procedure: RELEASE, CARPAL TUNNEL, ENDOSCOPIC (Right: Wrist)   Anesthesia type: Choice   Diagnosis: Carpal tunnel syndrome, right [G56.01]   Pre-op diagnosis: Carpal tunnel syndrome, right G56.01   Location: [facility] OR ROOM 02 / [facility]   Surgeons: Christena Flake, MD      NOTE: Available PAT nursing documentation and vital signs have been reviewed. Clinical nursing staff has updated patient's PMH/PSHx, current medication list, and drug allergies/intolerances to ensure comprehensive history available to assist in medical decision making as it pertains to the aforementioned surgical procedure and anticipated anesthetic course. Extensive review of available clinical information personally performed. Stanley PMH and PSHx updated with any diagnoses/procedures that  may have been inadvertently omitted during his intake with the pre-admission testing department's nursing staff.  Clinical Discussion:  Maria Davies is a 83 y.o. female who is submitted for pre-surgical anesthesia review and clearance prior to her undergoing the above procedure. Patient has never been a smoker. Pertinent PMH includes: CAD, severe aortic valve stenosis (s/p TAVR), CHB (s/p PPM placement), diastolic dysfunction, BILATERAL carotid artery disease, aortic atherosclerosis, cardiac murmur, HTN, hyperthyroidism, anemia, RIGHT breast cancer, colon cancer, Guillain-Barr syndrome, OA, multilevel spinal spondylosis, lumbar DDD, cervical spinal DDD with resulting stenosis.   Patient is followed by cardiology Juliann Pares, MD). She was last seen in the cardiology clinic on 08/16/2022; notes reviewed. At the time of her clinic  visit, patient doing well overall from a cardiovascular perspective. Patient denied any chest pain, shortness of breath, PND, orthopnea, palpitations, significant peripheral edema, weakness, fatigue, vertiginous symptoms, or presyncope/syncope. Patient with a past medical history significant for cardiovascular diagnoses. Documented physical exam was grossly benign, providing no evidence of acute exacerbation and/or decompensation of the patient's known cardiovascular conditions.  Patient found to have aortic valve stenosis on TTE performed on 08/17/2016.  At that time, study revealed normal left ventricular systolic function with an EF of 55%.  There was mild aortic valve stenosis with a mean pressure gradient of 29.5 mmHg; AVA (VTI) = 1.3 cm.  Since diagnosis, stenosis has been monitored EF serial noninvasive studies as follows:   TTE performed on 08/25/2018 revealed normal left trickle systolic function with an EF of >55%.  There was moderate concentric LVH. Left ventricular diastolic Doppler parameters consistent with abnormal relaxation (G1DD).  There was trivial to mild pan valvular regurgitation noted.  Degree of aortic valve stenosis had progressed to moderate with a mean pressure gradient of 42.3 mmHg; AVA (VTI) = 0.82 cm.   Repeat TTE performed on 05/30/2020 revealed a normal left trickle systolic function with an EF of >55%.  Left atrium moderately dilated.  There was trivial mitral and tricuspid valve regurgitation.  Degree of stenosis had progressed to severe with a mean pressure gradient of 47.2 mmHg; AVA (VTI) = 0.59 cm.   Diagnostic RIGHT/LEFT heart catheterization was performed on 08/11/2020.  There was no evidence of obstructive coronary artery disease.  Wedge pressure was mildly elevated.  Cardiac output and index were elevated in the context of anemia (hemoglobin 9 g/dL).  There was no evidence of intra-atrial shunting noted.   Patient ultimately underwent TAVR procedure via a RIGHT  percutaneous femoral approach [facility] on 08/31/2020.  A 23 mm Edwards SAPIEN 3 ultra bioprosthetic valve was placed.  Procedure was complicated by intra/postoperative complete heart block requiring placement of a transvenous pacemaker.  Postoperatively, when unable to discontinue transvenous pacing, electrophysiology was consulted and the decision was made to place a permanent implanted cardiac device.  Medtronic device, with LEFT bundle lead, was placed on 09/03/2020. Implanted cardiac device is regularly interrogated by patient's primary electrophysiology team.    Most recent TTE performed on 02/08/2023 revealed a normal left ventricular systolic function with an EF of 60-65%. There was mild LVH.  There were no regional wall motion abnormalities. Left ventricular diastolic Doppler parameters consistent with abnormal relaxation (G1DD). Left atrium mildly dilated. Right ventricular cavity size mildly enlarged with normal function normal. There was trivial tricuspid and mild mitral valve regurgitation. Bioprosthetic aortic valve well seated and functioning properly. Mean trans-aortic valve gradient elevated at 16.5 mmHg; AVA (VTI) = 1.18 cm2. All remaining transvalvular gradients were noted to be normal providing no evidence suggestive of valvular stenosis. Aorta normal in size with no evidence of ectasia or aneurysmal dilatation.  Blood pressure well controlled at 124/80 mmHg on currently prescribed ACEi (benazepril), diuretic (HCTZ), and beta-blocker (metoprolol succinate) therapies.  Patient is not on any type of lipid-lowering therapies for her HLD diagnosis and ASCVD prevention. Patient is not diabetic. She does not have an OSAH diagnosis. Patient is able to complete all of her  ADL/IADLs without cardiovascular limitation.  Per the DASI, patient is able to achieve at least 4 METS of physical activity without experiencing any significant degree of angina/anginal equivalent symptoms.  No changes were made to her medication regimen during her visit with cardiology.  Patient scheduled to follow-up with outpatient cardiology in 6 months or sooner if needed.  Maria Davies is scheduled for an elective ENDOSCOPIC RIGHT CARPEL TUNNEL RELEASE on 10/02/2023 with Dr. Leron Croak, MD.  Given patient's past medical history significant for cardiovascular diagnoses, presurgical cardiac clearance was sought by the PAT team. Per cardiology, "this patient is optimized for surgery and may proceed with the planned procedural course with a LOW risk of significant perioperative cardiovascular complications".  In review of the patient's chart, it is noted that she is on daily oral antithrombotic therapy. Given that patient's past medical history is significant for cardiovascular diagnoses, orthopedics has cleared patient to continue her daily low dose ASA throughout her perioperative course.  Patient has been updated on these directives from her specialty care providers by the PAT team.  Patient denies previous perioperative complications with anesthesia in the past. In review her EMR, it is noted that patient underwent a general anesthetic course here at [facility] ([facility]) in 01/2023 without documented complications.      09/26/2023    9:22 AM 09/24/2023   12:58 PM 07/05/2023    9:26 AM  Vitals with BMI  Height 5\' 3"  5\' 3"    Weight 160 lbs 160 lbs 191 lbs 3 oz  BMI 28.35 28.35 33.88  Systolic --  105  Diastolic --  62  Pulse   102   Providers/Specialists:  NOTE: Primary physician provider listed below. Patient may have been seen by APP or partner within same practice.   PROVIDER ROLE / SPECIALTY LAST OV  Poggi, Excell Seltzer, MD Orthopedics (Surgeon) 08/19/2023  Dorcas Carrow, DO Primary Care Provider 07/05/2023  Rudean Hitt, MD Cardiology 08/16/2022  Gerarda Fraction, MD Medical Oncology 07/03/2023  Wendall Mola, MD Endocrinology 08/12/2023    Allergies:  Allergies  Allergen Reactions   Tape     (Skin tears)Prefers Paper tape be used   Current Home Medications:   No current facility-administered medications for this encounter.    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   a.) stage IIIb; b.) RIGHT colon mass Bx (+) for invasive moderately differentiated adenocarcinoma (pT3 pN1a)   Anemia    Aortic atherosclerosis (HCC)    Aortic stenosis    a.) TTE 08/17/2016: EF >55%, mild AS (MPG 29.5); b.) TTE 08/25/2018: EF >55%; mod AS (MPG 42.3); c.) TTE 06/17/2020: EF >55%; severe AS (MPG 47.2); d.) s/p TAVR 08/31/2020 at [facility] - 23 mm Edwards Sapien 3 Ultra valve placed via a RIGHT percutaneous femoral approach --> complicated by intra/postoperative CHB requiring TVP and ultimate PPM placement   Arthritis    Basal cell carcinoma (BCC) of back    Bilateral carotid artery disease (HCC)    Bilateral leg edema    Breast cancer, right (HCC) 2011   a.) stage Ia IMC (ER/PR +, HER2/neu -); b.) lumpectomy + XRT in 2011; c.) completed 5 years AI therapy (anastrozole) in 11/2014   Cervical spinal stenosis    Cervical spondylosis    Chemotherapy induced nausea and vomiting    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   a.) TVP placed intraoperatively during TAVR secondary to development of CHB; b.) Medtronic PPM placed 09/03/2020 for continued postoperative CHB   Coronary artery disease    a.) R/LHC 08/11/2020: no sig CAD   DDD  (degenerative disc disease), cervical    Diastolic dysfunction    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   Heart murmur    History of bilateral cataract extraction    Hyperplastic colon polyp    Hypertension    Hyperthyroidism    Long-term use of aspirin therapy    Neuropathy    Osteopenia    Personal history of other malignant neoplasm of large intestine 06/18/2020   PONV (postoperative nausea and vomiting)    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   a.) TVP placed intraoperatively during TAVR secondary to development of CHB; b.) Medtronic PPM placed 09/03/2020 for postoperative continued postoperative CHB   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   a.) s/p TAVR 08/31/2020 at [facility] - 23 mm Edwards Sapien 3 Ultra valve placed via a RIGHT percutaneous femoral approach --> complicated by intra/postoperative CHB requiring TVP and ultimate PPM placement   Thyroid nodule 01/25/2022   a.) CT C-spine 01/25/2022: 22 mm right thyroid nodule   Past Surgical History:  Procedure Laterality Date   BACK SURGERY     lumber   BREAST EXCISIONAL BIOPSY Right 03/2009   BREAST EXCISIONAL BIOPSY Bilateral    NEG   CATARACT EXTRACTION W/ INTRAOCULAR LENS  IMPLANT, BILATERAL     CHOLECYSTECTOMY     COLONOSCOPY WITH PROPOFOL N/A 10/19/2019   Procedure: COLONOSCOPY WITH PROPOFOL-attempted, unable to perform poor prep;  Surgeon: Midge Minium, MD;  Location:  [facility];  Service: Endoscopy;  Laterality: N/A;  priority 3   COLONOSCOPY WITH PROPOFOL N/A 10/20/2019   Procedure: COLONOSCOPY WITH PROPOFOL;  Surgeon: Midge Minium, MD;  Location: [facility] ENDOSCOPY;  Service: Endoscopy;  Laterality: N/A;   COLONOSCOPY WITH PROPOFOL N/A 11/16/2021   Procedure: COLONOSCOPY WITH PROPOFOL;  Surgeon: Midge Minium, MD;  Location: [facility] ENDOSCOPY;  Service: Endoscopy;  Laterality: N/A;   ESOPHAGEAL DILATION  10/19/2019   Procedure: ESOPHAGEAL DILATION;  Surgeon: Midge Minium, MD;   Location: [facility];  Service: Endoscopy;;   ESOPHAGOGASTRODUODENOSCOPY (EGD) WITH PROPOFOL N/A 10/19/2019   Procedure: ESOPHAGOGASTRODUODENOSCOPY (EGD) WITH PROPOFOL;  Surgeon: Midge Minium, MD;  Location: [facility];  Service: Endoscopy;  Laterality: N/A;   FLEXIBLE SIGMOIDOSCOPY N/A 03/20/2022   Procedure: FLEXIBLE SIGMOIDOSCOPY;  Surgeon: Midge Minium, MD;  Location: [facility] ENDOSCOPY;  Service: Endoscopy;  Laterality: N/A;   HEMORRHOID SURGERY     MASTECTOMY, PARTIAL Right 2010   PACEMAKER INSERTION N/A 09/03/2020   Procedure: PACEMAKER WITH LEFT BUNDLE LEAD (MEDTRONIC); Location: [facility]; Surgeon: Constance Haw, MD   [facility] REMOVAL  2021   PORTACATH PLACEMENT Right 11/23/2019   Procedure: INSERTION PORT-A-CATH;  Surgeon: Campbell Lerner, MD;  Location: [facility] ORS;  Service: General;  Laterality: Right;   RECTAL EXAM UNDER ANESTHESIA N/A 07/18/2022   Procedure: RECTAL EXAM UNDER ANESTHESIA;  Surgeon: Campbell Lerner, MD;  Location: [facility] ORS;  Service: General;  Laterality: N/A;   REVERSE SHOULDER ARTHROPLASTY Right 02/12/2023   Procedure: REVERSE SHOULDER ARTHROPLASTY;  Surgeon: Christena Flake, MD;  Location: [facility] ORS;  Service: Orthopedics;  Laterality: Right;   RIGHT COLECTOMY Right 10/2019   Procedure: ROBOTIC ASSISTED TIGHT HEMICOLECTOMY   RIGHT/LEFT HEART CATH AND CORONARY ANGIOGRAPHY Right 08/11/2020   Procedure: RIGHT/LEFT HEART CATH AND CORONARY ANGIOGRAPHY; Location: [facility]; Surgeon: Manson Allan, MD   TRANSCATHETER AORTIC VALVE REPLACEMENT, TRANSFEMORAL Right 08/31/2020   Procedure: TRANSCATHETER AORTIC VALVE REPLACEMENT, RIGHT TRANSFEMORAL; Location: [facility]; Surgeon: Clent Jacks, MD   TUMOR EXCISION N/A 04/11/2022   Procedure: TUMOR EXCISION RECTAL;  Surgeon: Campbell Lerner, MD;  Location: [facility] ORS;  Service: General;  Laterality: N/A;   Family History  Problem Relation Age of Onset   Cancer Mother    Heart disease Father    Breast cancer Paternal Aunt 66    Social History   Tobacco Use   Smoking status: Never    Passive exposure: Never   Smokeless tobacco: Never  Substance Use Topics   Alcohol use: Yes    Alcohol/week: 14.0 standard drinks of alcohol    Types: 14 Shots of liquor per week    Comment: 2 cocktails daily   Pertinent Clinical Results:  LABS:  Hospital Outpatient Visit on 09/25/2023  Component Date Value Ref Range Status   Sodium 09/25/2023 125 (L)  135 - 145 mmol/L Final   Potassium 09/25/2023 3.0 (L)  3.5 - 5.1 mmol/L Final   Chloride 09/25/2023 86 (L)  98 - 111 mmol/L Final   CO2 09/25/2023 30  22 - 32 mmol/L Final   Glucose, Bld 09/25/2023 125 (H)  70 - 99 mg/dL Final   Glucose reference range applies only to samples taken after fasting for at least 8 hours.   BUN 09/25/2023 17  8 - 23 mg/dL Final   Creatinine, Ser 09/25/2023 0.64  0.44 - 1.00 mg/dL Final   Calcium 16/03/9603 9.3  8.9 - 10.3 mg/dL Final   GFR, Estimated 09/25/2023 >60  >60 mL/min Final   Comment: (NOTE) Calculated using the CKD-EPI  Creatinine Equation (2021)    Anion gap 09/25/2023 9  5 - 15 Final   Performed at [facility], [address] [place], [place] [address]   WBC 09/25/2023 5.9  4.0 - 10.5 K/uL Final   RBC 09/25/2023 4.08  3.87 - 5.11 MIL/uL Final   Hemoglobin 09/25/2023 14.2  12.0 - 15.0 g/dL Final   HCT 47/82/9562 38.6  36.0 - 46.0 % Final   MCV 09/25/2023 94.6  80.0 - 100.0 fL Final   MCH 09/25/2023 34.8 (H)  26.0 - 34.0 pg Final   MCHC 09/25/2023 36.8 (H)  30.0 - 36.0 g/dL Final   RDW 13/01/6577 12.1  11.5 - 15.5 % Final   Platelets 09/25/2023 186  150 - 400 K/uL Final   nRBC 09/25/2023 0.0  0.0 - 0.2 % Final   Performed at [facility], [address] [place], [place] [address]    ECG: Date: 09/25/2023  Time ECG obtained: 1016 AM Rate: 73 bpm Rhythm:  atrial sensed ventricular paced rhythm Axis (leads I and aVF): normal Intervals: PR 172 ms. QRS 184 ms. QTc 504 ms. ST segment and T wave changes:  No evidence of acute T wave abnormalities or significant ST segment elevation or depression.  Evidence of a possible, age undetermined, prior infarct:  No Comparison: Similar to previous tracing obtained on 02/07/2023   IMAGING / PROCEDURES: CT ABDOMEN PELVIS W CONTRAST performed on 06/26/2023 No findings of active malignancy. Mild wall thickening and mucosal irregularity in the distal rectum, significance uncertain. Colonic diverticulosis. Small umbilical hernia contains adipose tissue. Lumbar spondylosis and degenerative disc disease. Aortic valve prosthesis.  Mild cardiomegaly.  Mitral valve calcification. Aortic and systemic atherosclerosis.  TRANSTHORACIC ECHOCARDIOGRAM performed on 02/08/2023 Left ventricular ejection fraction, by estimation, is 60 to 65%. Left ventricular ejection fraction by PLAX is 68 %. The left ventricle has normal function. The left ventricle has no regional wall motion abnormalities. There is mild left ventricular hypertrophy. Left ventricular diastolic parameters are consistent with Grade I diastolic dysfunction (impaired relaxation).  Right ventricular systolic function is normal. The right ventricular size is mildly enlarged.  Left atrial size was mildly dilated.  The mitral valve is grossly normal. Mild mitral valve regurgitation.  The aortic valve is grossly normal. Aortic valve regurgitation is not visualized. Echo findings are consistent with normal structure and function of the aortic valve prosthesis.   Impression and Plan:  Maria Davies has been referred for pre-anesthesia review and clearance prior to her undergoing the planned anesthetic and procedural courses. Available labs, pertinent testing, and imaging results were personally reviewed by me in preparation for upcoming operative/procedural course. [facility] medical record has been updated following extensive record review and patient interview with PAT staff.   Preoperative K+ levels  found to be low at 3.0 mmol/L. Patient denies GI symptoms. She is on daily thiazide diuretic therapy. Prescription sent in for K-Dur 20 mEq to be taken daily for a total of 7 days. Patient will take a dose on the morning of her procedure. We will plan on rechecking K+ levels prior to patient's surgery to ensure that she is safe to proceed with the planned surgical/anesthetic course. Patient and surgeon have been updated on the plan of care as it stands at this point.   This patient has been appropriately cleared by cardiology with an overall LOW risk of experiencing significant perioperative cardiovascular complications. Completed perioperative prescription for cardiac device management documentation completed by primary cardiology team and placed  on patient's chart for review by the surgical/anesthetic team on the day of her procedure. Electrophysiology indicating that procedure should not interfere with planned surgical procedure. Beyond normal perioperative cardiovascular monitoring, there are no recommendations from electrophysiology team that prompt further discussion/recommendations from industry representative.   Based on clinical review performed today (09/26/23), barring any significant acute changes in the patient's overall condition, it is anticipated that she will be able to proceed with the planned surgical intervention. Any acute changes in clinical condition may necessitate her procedure being postponed and/or cancelled. Patient will meet with anesthesia team (MD and/or CRNA) on the day of her procedure for preoperative evaluation/assessment. Questions regarding anesthetic course will be fielded at that time.   Pre-surgical instructions were reviewed with the patient during his PAT appointment, and questions were fielded to satisfaction by PAT clinical staff. She has been instructed on which medications that she will need to hold prior to surgery, as well as the ones that have been deemed  safe/appropriate to take on the day of her procedure. As part of the general education provided by PAT, patient made aware both verbally and in writing, that she would need to abstain from the use of any illegal substances during her perioperative course. She was advised that failure to follow the provided instructions could necessitate case cancellation or result in serious perioperative complications up to and including death. Patient encouraged to contact PAT and/or her surgeon's office to discuss any questions or concerns that may arise prior to surgery; verbalized understanding.   Quentin Mulling, MSN, APRN, FNP-C, CEN [facility]  Perioperative Services Nurse Practitioner Phone: [phone] Fax: [phone] 09/26/23 10:04 AM  NOTE: This note has been prepared using Dragon dictation software. Despite my best ability to proofread, there is always the potential that unintentional transcriptional errors may still occur from this process.

## 2023-09-26 NOTE — Patient Instructions (Signed)
 Ms. Hendley , Thank you for taking time to come for your Medicare Wellness Visit. I appreciate your ongoing commitment to your health goals. Please review the following plan we discussed and let me know if I can assist you in the future.   Referrals/Orders/Follow-Ups/Clinician Recommendations: I have placed an order for a mammogram. Call [facility] @ [phone] to schedule. You are due for an eye exam. Call and schedule at your earliest convenience.  This is a list of the screening recommended for you and due dates:  Health Maintenance  Topic Date Due   Mammogram  02/06/2023   Flu Shot  01/17/2024   DEXA scan (bone density measurement)  07/16/2024   Medicare Annual Wellness Visit  09/25/2024   DTaP/Tdap/Td vaccine (3 - Td or Tdap) 02/14/2031   Pneumonia Vaccine  Completed   Zoster (Shingles) Vaccine  Completed   HPV Vaccine  Aged Out   Meningitis B Vaccine  Aged Out   COVID-19 Vaccine  Discontinued   Hepatitis C Screening  Discontinued    Advanced directives: (Copy Requested) Please bring a copy of your health care power of attorney and living will to the office to be added to your chart at your convenience. You can mail to [facility] [address] [place], [place] [address] or email to [email or web address]  Next Medicare Annual Wellness Visit scheduled for next year: Yes, 10/08/24 @ 9:20am (phone visit0  Fall Prevention in the Home, Adult Falls can cause injuries and affect people of all ages. There are many simple things that you can do to make your home safe and to help prevent falls. If you need it, ask for help making these changes. What actions can I take to prevent falls? General information Use good lighting in all rooms. Make sure to: Replace any light bulbs that burn out. Turn on lights if it is dark and use night-lights. Keep items that you use often in easy-to-reach places. Lower the shelves around your home if needed. Move  furniture so that there are clear paths around it. Do not keep throw rugs or other things on the floor that can make you trip. If any of your floors are uneven, fix them. Add color or contrast paint or tape to clearly mark and help you see: Grab bars or handrails. First and last steps of staircases. Where the edge of each step is. If you use a ladder or stepladder: Make sure that it is fully opened. Do not climb a closed ladder. Make sure the sides of the ladder are locked in place. Have someone hold the ladder while you use it. Know where your pets are as you move through your home. What can I do in the bathroom?     Keep the floor dry. Clean up any water that is on the floor right away. Remove soap buildup in the bathtub or shower. Buildup makes bathtubs and showers slippery. Use non-skid mats or decals on the floor of the bathtub or shower. Attach bath mats securely with double-sided, non-slip rug tape. If you need to sit down while you are in the shower, use a non-slip stool. Install grab bars by the toilet and in the bathtub and shower. Do not use towel bars as grab bars. What can I do in the bedroom? Make sure that you have a light by your bed that is easy to reach. Do not use any sheets or blankets on your bed that hang to the floor. Have a  firm bench or chair with side arms that you can use for support when you get dressed. What can I do in the kitchen? Clean up any spills right away. If you need to reach something above you, use a sturdy step stool that has a grab bar. Keep electrical cables out of the way. Do not use floor polish or wax that makes floors slippery. What can I do with my stairs? Do not leave anything on the stairs. Make sure that you have a light switch at the top and the bottom of the stairs. Have them installed if you do not have them. Make sure that there are handrails on both sides of the stairs. Fix handrails that are broken or loose. Make sure that  handrails are as long as the staircases. Install non-slip stair treads on all stairs in your home if they do not have carpet. Avoid having throw rugs at the top or bottom of stairs, or secure the rugs with carpet tape to prevent them from moving. Choose a carpet design that does not hide the edge of steps on the stairs. Make sure that carpet is firmly attached to the stairs. Fix any carpet that is loose or worn. What can I do on the outside of my home? Use bright outdoor lighting. Repair the edges of walkways and driveways and fix any cracks. Clear paths of anything that can make you trip, such as tools or rocks. Add color or contrast paint or tape to clearly mark and help you see high doorway thresholds. Trim any bushes or trees on the main path into your home. Check that handrails are securely fastened and in good repair. Both sides of all steps should have handrails. Install guardrails along the edges of any raised decks or porches. Have leaves, snow, and ice cleared regularly. Use sand, salt, or ice melt on walkways during winter months if you live where there is ice and snow. In the garage, clean up any spills right away, including grease or oil spills. What other actions can I take? Review your medicines with your health care provider. Some medicines can make you confused or feel dizzy. This can increase your chance of falling. Wear closed-toe shoes that fit well and support your feet. Wear shoes that have rubber soles and low heels. Use a cane, walker, scooter, or crutches that help you move around if needed. Talk with your provider about other ways that you can decrease your risk of falls. This may include seeing a physical therapist to learn to do exercises to improve movement and strength. Where to find more information Centers for Disease Control and Prevention, STEADI: TonerPromos.no [employer] on Aging: BaseRingTones.pl National Institute on Aging: BaseRingTones.pl Contact a health care  provider if: You are afraid of falling at home. You feel weak, drowsy, or dizzy at home. You fall at home. Get help right away if you: Lose consciousness or have trouble moving after a fall. Have a fall that causes a head injury. These symptoms may be an emergency. Get help right away. Call 911. Do not wait to see if the symptoms will go away. Do not drive yourself to the hospital. This information is not intended to replace advice given to you by your health care provider. Make sure you discuss any questions you have with your health care provider. Document Revised: 02/05/2022 Document Reviewed: 02/05/2022 Elsevier Patient Education  2024 [employer].

## 2023-09-26 NOTE — Progress Notes (Signed)
 Subjective:   Maria Davies is a 83 y.o. who presents for a Medicare Wellness preventive visit.  Visit Complete: Virtual I connected with  TURKESSA OSTROM on 09/26/23 by a audio enabled telemedicine application and verified that I am speaking with the correct person using two identifiers.  Patient Location: Home  Provider Location: Home Office  I discussed the limitations of evaluation and management by telemedicine. The patient expressed understanding and agreed to proceed.  Vital Signs: Because this visit was a virtual/telehealth visit, some criteria may be missing or patient reported. Any vitals not documented were not able to be obtained and vitals that have been documented are patient reported.  VideoDeclined- This patient declined Librarian, academic. Therefore the visit was completed with audio only.  Persons Participating in Visit: Patient.  AWV Questionnaire: No: Patient Medicare AWV questionnaire was not completed prior to this visit.  Cardiac Risk Factors include: advanced age (>69men, >63 women);hypertension;dyslipidemia     Objective:    Today's Vitals   09/26/23 0922  Weight: 160 lb (72.6 kg)  Height: 5\' 3"  (1.6 m)  PainSc: 4    Body mass index is 28.34 kg/m.     09/26/2023    9:38 AM 09/24/2023    1:16 PM 07/03/2023    9:58 AM 02/07/2023    2:53 PM 09/10/2022    9:13 AM 08/21/2022   10:42 AM 07/18/2022   11:35 AM  Advanced Directives  Does Patient Have a Medical Advance Directive? Yes Yes Yes No No Yes Yes  Type of Estate agent of [place];Living will  Healthcare Power of [place];Living will   Healthcare Power of [place];Living will Healthcare Power of [place];Living will  Does patient want to make changes to medical advance directive? No - Patient declined No - Patient declined    No - Patient declined No - Patient declined  Copy of Healthcare Power of Attorney in Chart? No - copy requested     No - copy  requested No - copy requested  Would patient like information on creating a medical advance directive?    No - Patient declined No - Patient declined No - Patient declined     Current Medications (verified) Outpatient Encounter Medications as of 09/26/2023  Medication Sig   amLODipine (NORVASC) 5 MG tablet Take 1 tablet (5 mg total) by mouth every morning.   aspirin EC 81 MG tablet Take 81 mg by mouth in the morning.   benazepril (LOTENSIN) 20 MG tablet Take 1 tablet (20 mg total) by mouth every morning.   Cholecalciferol (D3 ADULT PO) Take 1 tablet by mouth in the morning. OTC unsure of dose   hydrochlorothiazide (HYDRODIURIL) 25 MG tablet TAKE 1 TABLET BY MOUTH ONCE DAILY   methimazole (TAPAZOLE) 10 MG tablet Take 10 mg by mouth in the morning.   metoprolol succinate (TOPROL-XL) 50 MG 24 hr tablet TAKE 1 TABLET BY MOUTH ONCE DAILY WITH OR IMMEDIATELY FOLLOWING A MEAL   Multiple Vitamin (MULTIVITAMIN WITH MINERALS) TABS tablet Take 1 tablet by mouth in the morning.   Probiotic Product (PROBIOTIC ADVANCED PO) Take 1 capsule by mouth in the morning.   ibuprofen (ADVIL) 200 MG tablet Take 200 mg by mouth in the morning. (Patient not taking: Reported on 09/26/2023)   potassium chloride SA (KLOR-CON M) 20 MEQ tablet Take 1 tablet (20 mEq total) by mouth daily for 7 days. (Patient not taking: Reported on 09/26/2023)   No facility-administered encounter medications on file as  of 09/26/2023.    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   a.) stage IIIb; b.) RIGHT colon mass Bx (+) for invasive moderately differentiated adenocarcinoma (pT3 pN1a)   Anemia    Aortic atherosclerosis (HCC)    Aortic stenosis    a.) TTE 08/17/2016: EF >55%, mild AS (MPG 29.5); b.) TTE 08/25/2018: EF >55%; mod AS (MPG 42.3); c.) TTE 06/17/2020: EF >55%; severe AS (MPG 47.2); d.) s/p TAVR 08/31/2020 at [facility] - 23 mm Edwards Sapien 3 Ultra valve  placed via a RIGHT percutaneous femoral approach --> complicated by intra/postoperative CHB requiring TVP and ultimate PPM placement   Arthritis    Basal cell carcinoma (BCC) of back    Bilateral carotid artery disease (HCC)    Bilateral leg edema    Breast cancer, right (HCC) 2011   a.) stage Ia IMC (ER/PR +, HER2/neu -); b.) lumpectomy + XRT in 2011; c.) completed 5 years AI therapy (anastrozole) in 11/2014   Cervical spinal stenosis    Cervical spondylosis    Chemotherapy induced nausea and vomiting    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   a.) TVP placed intraoperatively during TAVR secondary to development of CHB; b.) Medtronic PPM placed 09/03/2020 for continued postoperative CHB   Coronary artery disease    a.) R/LHC 08/11/2020: no sig CAD   DDD (degenerative disc disease), cervical    Diastolic dysfunction    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   Heart murmur    History of bilateral cataract extraction    Hyperplastic colon polyp    Hypertension    Hyperthyroidism    Long-term use of aspirin therapy    Neuropathy    Osteopenia    Personal history of other malignant neoplasm of large intestine 06/18/2020   PONV (postoperative nausea and vomiting)    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   a.) TVP placed intraoperatively during TAVR secondary to development of CHB; b.) Medtronic PPM placed 09/03/2020 for postoperative continued postoperative CHB   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   a.) s/p TAVR 08/31/2020 at [facility] - 23 mm Edwards Sapien 3 Ultra valve placed via a RIGHT percutaneous femoral approach --> complicated by intra/postoperative CHB requiring TVP and ultimate PPM placement   Thyroid nodule 01/25/2022   a.) CT C-spine 01/25/2022: 22 mm right thyroid nodule   Past Surgical History:  Procedure Laterality Date   BACK SURGERY     lumber   BREAST EXCISIONAL BIOPSY Right 03/2009   BREAST EXCISIONAL BIOPSY Bilateral    NEG    CATARACT EXTRACTION W/ INTRAOCULAR LENS  IMPLANT, BILATERAL     CHOLECYSTECTOMY     COLONOSCOPY WITH PROPOFOL N/A 10/19/2019   Procedure: COLONOSCOPY WITH PROPOFOL-attempted, unable to perform poor prep;  Surgeon: Midge Minium, MD;  Location: [facility];  Service: Endoscopy;  Laterality: N/A;  priority 3   COLONOSCOPY WITH PROPOFOL N/A 10/20/2019   Procedure: COLONOSCOPY WITH PROPOFOL;  Surgeon: Midge Minium, MD;  Location: [facility] END[facility]COPY;  Service: Endoscopy;  Laterality: N/A;   COLONOSCOPY WITH PROPOFOL N/A 11/16/2021   Procedure: COLONOSCOPY WITH PROPOFOL;  Surgeon: Midge Minium, MD;  Location: [facility] ENDOSCOPY;  Service: Endoscopy;  Laterality: N/A;   ESOPHAGEAL DILATION  10/19/2019   Procedure: ESOPHAGEAL DILATION;  Surgeon: Midge Minium, MD;  Location: [facility];  Service: Endoscopy;;   ESOPHAGOGASTRODUODENOSCOPY (EGD) WITH PROPOFOL N/A 10/19/2019   Procedure: ESOPHAGOGASTRODUODENOSCOPY (EGD) WITH PROPOFOL;  Surgeon:  Midge Minium, MD;  Location: [facility];  Service: Endoscopy;  Laterality: N/A;   FLEXIBLE SIGMOIDOSCOPY N/A 03/20/2022   Procedure: FLEXIBLE SIGMOIDOSCOPY;  Surgeon: Midge Minium, MD;  Location: [facility] ENDOSCOPY;  Service: Endoscopy;  Laterality: N/A;   HEMORRHOID SURGERY     MASTECTOMY, PARTIAL Right 2010   PACEMAKER INSERTION N/A 09/03/2020   Procedure: PACEMAKER WITH LEFT BUNDLE LEAD (MEDTRONIC); Location: [facility]; Surgeon: Constance Haw, MD   [facility] REMOVAL  2021   PORTACATH PLACEMENT Right 11/23/2019   Procedure: INSERTION PORT-A-CATH;  Surgeon: Campbell Lerner, MD;  Location: [facility] ORS;  Service: General;  Laterality: Right;   RECTAL EXAM UNDER ANESTHESIA N/A 07/18/2022   Procedure: RECTAL EXAM UNDER ANESTHESIA;  Surgeon: Campbell Lerner, MD;  Location: [facility] ORS;  Service: General;  Laterality: N/A;   REVERSE SHOULDER ARTHROPLASTY Right 02/12/2023   Procedure: REVERSE SHOULDER ARTHROPLASTY;  Surgeon: Christena Flake, MD;  Location: [facility] ORS;   Service: Orthopedics;  Laterality: Right;   RIGHT COLECTOMY Right 10/2019   Procedure: ROBOTIC ASSISTED TIGHT HEMICOLECTOMY   RIGHT/LEFT HEART CATH AND CORONARY ANGIOGRAPHY Right 08/11/2020   Procedure: RIGHT/LEFT HEART CATH AND CORONARY ANGIOGRAPHY; Location: [facility]; Surgeon: Manson Allan, MD   TRANSCATHETER AORTIC VALVE REPLACEMENT, TRANSFEMORAL Right 08/31/2020   Procedure: TRANSCATHETER AORTIC VALVE REPLACEMENT, RIGHT TRANSFEMORAL; Location: [facility]; Surgeon: Clent Jacks, MD   TUMOR EXCISION N/A 04/11/2022   Procedure: TUMOR EXCISION RECTAL;  Surgeon: Campbell Lerner, MD;  Location: [facility] ORS;  Service: General;  Laterality: N/A;   Family History  Problem Relation Age of Onset   Cancer Mother    Heart disease Father    Breast cancer Paternal Aunt 54   Social History   Socioeconomic History   Marital status: Widowed    Spouse name: Not on file   Number of children: 2   Years of education: Not on file   Highest education level: Not on file  Occupational History   Occupation: retired   Tobacco Use   Smoking status: Never    Passive exposure: Never   Smokeless tobacco: Never  Vaping Use   Vaping status: Never Used  Substance and Sexual Activity   Alcohol use: Yes    Alcohol/week: 14.0 standard drinks of alcohol    Types: 14 Shots of liquor per week    Comment: 2 cocktails daily   Drug use: No   Sexual activity: Not Currently  Other Topics Concern   Not on file  Social History Narrative   Lives alone, 2 biological children, 2 step-children and 9 grandchildren,   Social Drivers of Corporate investment banker Strain: Low Risk  (09/26/2023)   Overall Financial Resource Strain (CARDIA)    Difficulty of Paying Living Expenses: Not hard at all  Food Insecurity: No Food Insecurity (09/26/2023)   Hunger Vital Sign    Worried About Running Out of Food in the Last Year: Never true    Ran Out of Food in the Last Year: Never true  Transportation Needs: No Transportation Needs  (09/26/2023)   PRAPARE - Administrator, Civil Service (Medical): No    Lack of Transportation (Non-Medical): No  Physical Activity: Inactive (09/26/2023)   Exercise Vital Sign    Days of Exercise per Week: 0 days    Minutes of Exercise per Session: 0 min  Stress: No Stress Concern Present (09/26/2023)   [employer] of Occupational Health - Occupational Stress Questionnaire    Feeling of Stress : Not at all  Social Connections: Moderately Isolated (09/26/2023)  Social Advertising account executive [NHANES]    Frequency of Communication with Friends and Family: More than three times a week    Frequency of Social Gatherings with Friends and Family: More than three times a week    Attends Religious Services: Never    Database administrator or Organizations: Yes    Attends Engineer, structural: More than 4 times per year    Marital Status: Widowed    Tobacco Counseling Counseling given: Not Answered    Clinical Intake:  Pre-visit preparation completed: Yes  Pain : 0-10 Pain Score: 4  Pain Type: Chronic pain Pain Location: Knee Pain Orientation: Right, Left Pain Descriptors / Indicators: Aching     BMI - recorded: 28.34 Nutritional Status: BMI 25 -29 Overweight Nutritional Risks: None Diabetes: No  Lab Results  Component Value Date   HGBA1C 5.1 07/05/2023   HGBA1C 4.7 02/13/2021   HGBA1C 4.5 08/15/2020     How often do you need to have someone help you when you read instructions, pamphlets, or other written materials from your doctor or pharmacy?: 1 - Never  Interpreter Needed?: No  Information entered by :: Tora Kindred, CMA   Activities of Daily Living     09/26/2023    9:24 AM 09/24/2023    1:18 PM  In your present state of health, do you have any difficulty performing the following activities:  Hearing? 0   Vision? 0   Difficulty concentrating or making decisions? 0   Walking or climbing stairs? 1   Comment due to knee pain and  neuropathy   Dressing or bathing? 0   Doing errands, shopping? 0 0  Preparing Food and eating ? N   Using the Toilet? N   In the past six months, have you accidently leaked urine? N   Do you have problems with loss of bowel control? N   Managing your Medications? N   Managing your Finances? N   Housekeeping or managing your Housekeeping? Y   Comment has someone to clean her house and mow the yard     Patient Care Team: Dorcas Carrow, DO as PCP - General (Family Medicine) Campbell Lerner, MD as Consulting Physician (General Surgery) Jeralyn Ruths, MD as Consulting Physician (Oncology) Poggi, Excell Seltzer, MD as Consulting Physician (Orthopedic Surgery) Raj Janus, MD as Physician Assistant (Endocrinology) Sung Amabile, DO as Consulting Physician (General Surgery)  Indicate any recent Medical Services you may have received from other than [facility] providers in the past year (date may be approximate).     Assessment:   This is a routine wellness examination for Maria Davies.  Hearing/Vision screen Hearing Screening - Comments:: Denies hearing loss Vision Screening - Comments:: Needs eye exam, Dr. Gardiner Fanti    Goals Addressed             This Visit's Progress    Patient Stated       Maintain current health     COMPLETED: Weight (lb) < 200 lb (90.7 kg)   160 lb (72.6 kg)    Would like to loose 10 pounds       Depression Screen     09/26/2023    9:35 AM 07/05/2023    9:28 AM 11/05/2022    9:18 AM 09/10/2022    9:11 AM 07/09/2022    9:47 AM 06/08/2022    8:24 AM 05/07/2022    9:23 AM  PHQ 2/9 Scores  PHQ - 2 Score 0 0 0  1 0 0 0  PHQ- 9 Score 1  2 1 1 2 2     Fall Risk     09/26/2023    9:39 AM 07/05/2023    9:28 AM 09/10/2022    9:14 AM 07/09/2022    9:48 AM 06/26/2022   10:35 AM  Fall Risk   Falls in the past year? 1 0 0 1 0  Number falls in past yr: 0 0 0 0   Injury with Fall? 1 0 0 0   Risk for fall due to : History of fall(s);Impaired balance/gait;Orthopedic  patient No Fall Risks History of fall(s) No Fall Risks   Follow up Falls prevention discussed;Falls evaluation completed;Education provided Falls prevention discussed;Education provided;Falls evaluation completed Falls prevention discussed;Falls evaluation completed Falls evaluation completed     MEDICARE RISK AT HOME:  Medicare Risk at Home Any stairs in or around the home?: Yes If so, are there any without handrails?: No Home free of loose throw rugs in walkways, pet beds, electrical cords, etc?: Yes Adequate lighting in your home to reduce risk of falls?: Yes Life alert?: No Use of a cane, walker or w/c?: No Grab bars in the bathroom?: Yes Shower chair or bench in shower?: Yes Elevated toilet seat or a handicapped toilet?: Yes  TIMED UP AND GO:  Was the test performed?  No  Cognitive Function: 6CIT completed        09/26/2023    9:41 AM 09/10/2022    9:20 AM 08/19/2020    8:21 AM  6CIT Screen  What Year? 0 points 0 points 0 points  What month? 0 points 0 points 0 points  What time? 0 points 0 points 0 points  Count back from 20 0 points 0 points 0 points  Months in reverse 0 points 0 points 0 points  Repeat phrase 0 points 0 points 4 points  Total Score 0 points 0 points 4 points    Immunizations Immunization History  Administered Date(s) Administered   Fluad Quad(high Dose 65+) 02/18/2019, 04/18/2020   Influenza, High Dose Seasonal PF 04/23/2016, 03/25/2017   Influenza,inj,Quad PF,6+ Mos 04/11/2015   Influenza-Unspecified 04/01/2018, 05/19/2020   PFIZER(Purple Top)SARS-COV-2 Vaccination 06/25/2019, 07/16/2019, 05/25/2020   Pneumococcal Conjugate-13 04/26/2014   Pneumococcal Polysaccharide-23 07/03/2016   Td 02/13/2021   Tdap 02/10/2009   Zoster Recombinant(Shingrix) 02/18/2018, 05/09/2018   Zoster, Live 10/24/2006    Screening Tests Health Maintenance  Topic Date Due   MAMMOGRAM  02/06/2023   INFLUENZA VACCINE  01/17/2024   DEXA SCAN  07/16/2024   Medicare  Annual Wellness (AWV)  09/25/2024   DTaP/Tdap/Td (3 - Td or Tdap) 02/14/2031   Pneumonia Vaccine 44+ Years old  Completed   Zoster Vaccines- Shingrix  Completed   HPV VACCINES  Aged Out   Meningococcal B Vaccine  Aged Out   COVID-19 Vaccine  Discontinued   Hepatitis C Screening  Discontinued    Health Maintenance  Health Maintenance Due  Topic Date Due   MAMMOGRAM  02/06/2023    Health Maintenance Items Addressed: Mammogram ordered, See Nurse Notes  Additional Screening:  Vision Screening: Recommended annual ophthalmology exams for early detection of glaucoma and other disorders of the eye.  Dental Screening: Recommended annual dental exams for proper oral hygiene  Community Resource Referral / Chronic Care Management: CRR required this visit?  No   CCM required this visit?  No     Plan:     I have personally reviewed and noted the following in the patient's  chart:   Medical and social history Use of alcohol, tobacco or illicit drugs  Current medications and supplements including opioid prescriptions. Patient is not currently taking opioid prescriptions. Functional ability and status Nutritional status Physical activity Advanced directives List of other physicians Hospitalizations, surgeries, and ER visits in previous 12 months Vitals Screenings to include cognitive, depression, and falls Referrals and appointments  In addition, I have reviewed and discussed with patient certain preventive protocols, quality metrics, and best practice recommendations. A written personalized care plan for preventive services as well as general preventive health recommendations were provided to patient.     Tora Kindred, CMA   09/26/2023   After Visit Summary: (Declined) Due to this being a telephonic visit, with patients personalized plan was offered to patient but patient Declined AVS at this time   Notes:  Placed order for a MMG DEXA and screening colonoscopy followed by  Dr. Orlie Dakin Needs eye exam. Patient aware and will call to schedule. Patient states she can not having any more vaccines due to Guillain Barre Syndrome

## 2023-10-01 DIAGNOSIS — I442 Atrioventricular block, complete: Secondary | ICD-10-CM | POA: Diagnosis not present

## 2023-10-01 DIAGNOSIS — G5601 Carpal tunnel syndrome, right upper limb: Secondary | ICD-10-CM | POA: Diagnosis not present

## 2023-10-02 ENCOUNTER — Ambulatory Visit: Payer: Self-pay | Admitting: Urgent Care

## 2023-10-02 ENCOUNTER — Other Ambulatory Visit: Payer: Self-pay

## 2023-10-02 ENCOUNTER — Encounter
Admission: RE | Disposition: A | Discharge status: Home / Self Care | Payer: Self-pay | Source: Home / Self Care | Attending: Surgery

## 2023-10-02 ENCOUNTER — Ambulatory Visit
Admission: RE | Admit: 2023-10-02 | Discharge: 2023-10-02 | Disposition: A | Discharge status: Home / Self Care | Attending: Surgery | Admitting: Surgery

## 2023-10-02 ENCOUNTER — Encounter: Payer: Self-pay | Admitting: Surgery

## 2023-10-02 DIAGNOSIS — I499 Cardiac arrhythmia, unspecified: Secondary | ICD-10-CM | POA: Diagnosis not present

## 2023-10-02 DIAGNOSIS — G5601 Carpal tunnel syndrome, right upper limb: Secondary | ICD-10-CM | POA: Insufficient documentation

## 2023-10-02 DIAGNOSIS — Z950 Presence of cardiac pacemaker: Secondary | ICD-10-CM | POA: Diagnosis not present

## 2023-10-02 DIAGNOSIS — I5033 Acute on chronic diastolic (congestive) heart failure: Secondary | ICD-10-CM | POA: Diagnosis not present

## 2023-10-02 DIAGNOSIS — E0500 Thyrotoxicosis with diffuse goiter without thyrotoxic crisis or storm: Secondary | ICD-10-CM | POA: Insufficient documentation

## 2023-10-02 DIAGNOSIS — I38 Endocarditis, valve unspecified: Secondary | ICD-10-CM | POA: Diagnosis not present

## 2023-10-02 DIAGNOSIS — I5032 Chronic diastolic (congestive) heart failure: Secondary | ICD-10-CM | POA: Insufficient documentation

## 2023-10-02 DIAGNOSIS — Z952 Presence of prosthetic heart valve: Secondary | ICD-10-CM | POA: Insufficient documentation

## 2023-10-02 DIAGNOSIS — Z79899 Other long term (current) drug therapy: Secondary | ICD-10-CM | POA: Insufficient documentation

## 2023-10-02 DIAGNOSIS — I110 Hypertensive heart disease with heart failure: Secondary | ICD-10-CM | POA: Insufficient documentation

## 2023-10-02 DIAGNOSIS — E876 Hypokalemia: Secondary | ICD-10-CM

## 2023-10-02 DIAGNOSIS — I2510 Atherosclerotic heart disease of native coronary artery without angina pectoris: Secondary | ICD-10-CM | POA: Diagnosis not present

## 2023-10-02 DIAGNOSIS — Z01812 Encounter for preprocedural laboratory examination: Secondary | ICD-10-CM

## 2023-10-02 HISTORY — DX: Other intervertebral disc degeneration, lumbar region without mention of lumbar back pain or lower extremity pain: M51.369

## 2023-10-02 HISTORY — DX: Disorder of arteries and arterioles, unspecified: I77.9

## 2023-10-02 HISTORY — DX: Cardiomegaly: I51.7

## 2023-10-02 HISTORY — DX: Spondylosis without myelopathy or radiculopathy, lumbar region: M47.816

## 2023-10-02 HISTORY — DX: Long term (current) use of aspirin: Z79.82

## 2023-10-02 HISTORY — DX: Umbilical hernia without obstruction or gangrene: K42.9

## 2023-10-02 HISTORY — PX: CARPAL TUNNEL RELEASE: SHX101

## 2023-10-02 LAB — POCT I-STAT, CHEM 8
BUN: 7 mg/dL — ABNORMAL LOW (ref 8–23)
Calcium, Ion: 1.15 mmol/L (ref 1.15–1.40)
Chloride: 93 mmol/L — ABNORMAL LOW (ref 98–111)
Creatinine, Ser: 0.5 mg/dL (ref 0.44–1.00)
Glucose, Bld: 178 mg/dL — ABNORMAL HIGH (ref 70–99)
HCT: 42 % (ref 36.0–46.0)
Hemoglobin: 14.3 g/dL (ref 12.0–15.0)
Potassium: 3.4 mmol/L — ABNORMAL LOW (ref 3.5–5.1)
Sodium: 129 mmol/L — ABNORMAL LOW (ref 135–145)
TCO2: 24 mmol/L (ref 22–32)

## 2023-10-02 SURGERY — RELEASE, CARPAL TUNNEL, ENDOSCOPIC
Anesthesia: General | Site: Wrist | Laterality: Right

## 2023-10-02 MED ORDER — ORAL CARE MOUTH RINSE: 15.0000 mL | Freq: Once | OROMUCOSAL | Status: AC

## 2023-10-02 MED ORDER — LACTATED RINGERS IV SOLN: INTRAVENOUS | Status: DC

## 2023-10-02 MED ORDER — PROPOFOL 1000 MG/100ML IV EMUL
INTRAVENOUS | Status: AC
  Filled 2023-10-02: qty 100

## 2023-10-02 MED ORDER — ACETAMINOPHEN 10 MG/ML IV SOLN
INTRAVENOUS | Status: AC
Start: 2023-10-02 — End: ?
  Filled 2023-10-02: qty 100

## 2023-10-02 MED ORDER — ONDANSETRON HCL 4 MG/2ML IJ SOLN
INTRAMUSCULAR | Status: AC
  Filled 2023-10-02: qty 2

## 2023-10-02 MED ORDER — CEFAZOLIN SODIUM-DEXTROSE 2-4 GM/100ML-% IV SOLN
INTRAVENOUS | Status: AC
  Filled 2023-10-02: qty 100

## 2023-10-02 MED ORDER — 0.9 % SODIUM CHLORIDE (POUR BTL) OPTIME
TOPICAL | Status: DC | PRN
Start: 2023-10-02 — End: 2023-10-02
  Administered 2023-10-02: 500 mL

## 2023-10-02 MED ORDER — ALBUTEROL SULFATE HFA 108 (90 BASE) MCG/ACT IN AERS
INHALATION_SPRAY | RESPIRATORY_TRACT | Status: AC
  Filled 2023-10-02: qty 6.7

## 2023-10-02 MED ORDER — DEXMEDETOMIDINE HCL IN NACL 80 MCG/20ML IV SOLN
INTRAVENOUS | Status: DC | PRN
  Administered 2023-10-02 (×3): 4 ug via INTRAVENOUS

## 2023-10-02 MED ORDER — FENTANYL CITRATE (PF) 100 MCG/2ML IJ SOLN
INTRAMUSCULAR | Status: AC
  Filled 2023-10-02: qty 2

## 2023-10-02 MED ORDER — OXYCODONE HCL 5 MG PO TABS: 5.0000 mg | ORAL_TABLET | Freq: Once | ORAL | Status: DC | PRN

## 2023-10-02 MED ORDER — OXYCODONE HCL 5 MG/5ML PO SOLN: 5.0000 mg | Freq: Once | ORAL | Status: DC | PRN

## 2023-10-02 MED ORDER — KETOROLAC TROMETHAMINE 15 MG/ML IJ SOLN
INTRAMUSCULAR | Status: AC
  Filled 2023-10-02: qty 1

## 2023-10-02 MED ORDER — MIDAZOLAM HCL 2 MG/2ML IJ SOLN
INTRAMUSCULAR | Status: AC
  Filled 2023-10-02: qty 2

## 2023-10-02 MED ORDER — BUPIVACAINE HCL (PF) 0.5 % IJ SOLN
INTRAMUSCULAR | Status: DC | PRN
  Administered 2023-10-02: 10 mL

## 2023-10-02 MED ORDER — FENTANYL CITRATE (PF) 100 MCG/2ML IJ SOLN
INTRAMUSCULAR | Status: DC | PRN
  Administered 2023-10-02 (×2): 50 ug via INTRAVENOUS

## 2023-10-02 MED ORDER — LIDOCAINE HCL (CARDIAC) PF 100 MG/5ML IV SOSY
PREFILLED_SYRINGE | INTRAVENOUS | Status: DC | PRN
  Administered 2023-10-02: 80 mg via INTRAVENOUS

## 2023-10-02 MED ORDER — LACTATED RINGERS IV SOLN: INTRAVENOUS | Status: DC | PRN

## 2023-10-02 MED ORDER — ONDANSETRON HCL 4 MG/2ML IJ SOLN
INTRAMUSCULAR | Status: DC | PRN
  Administered 2023-10-02: 4 mg via INTRAVENOUS

## 2023-10-02 MED ORDER — ONDANSETRON HCL 4 MG/2ML IJ SOLN: 4.0000 mg | Freq: Once | INTRAMUSCULAR | Status: DC | PRN

## 2023-10-02 MED ORDER — CHLORHEXIDINE GLUCONATE 0.12 % MT SOLN
15.0000 mL | Freq: Once | OROMUCOSAL | Status: AC
  Administered 2023-10-02: 15 mL via OROMUCOSAL

## 2023-10-02 MED ORDER — LIDOCAINE HCL (PF) 2 % IJ SOLN
INTRAMUSCULAR | Status: AC
  Filled 2023-10-02: qty 5

## 2023-10-02 MED ORDER — MIDAZOLAM HCL 2 MG/2ML IJ SOLN
INTRAMUSCULAR | Status: DC | PRN
  Administered 2023-10-02: 1 mg via INTRAVENOUS

## 2023-10-02 MED ORDER — CEFAZOLIN SODIUM-DEXTROSE 2-4 GM/100ML-% IV SOLN
2.0000 g | INTRAVENOUS | Status: AC
  Administered 2023-10-02: 2 g via INTRAVENOUS

## 2023-10-02 MED ORDER — EPHEDRINE SULFATE-NACL 50-0.9 MG/10ML-% IV SOSY
PREFILLED_SYRINGE | INTRAVENOUS | Status: DC | PRN
  Administered 2023-10-02: 5 mg via INTRAVENOUS

## 2023-10-02 MED ORDER — FENTANYL CITRATE (PF) 100 MCG/2ML IJ SOLN: 25.0000 ug | INTRAMUSCULAR | Status: DC | PRN

## 2023-10-02 MED ORDER — PROPOFOL 10 MG/ML IV BOLUS
INTRAVENOUS | Status: DC | PRN
  Administered 2023-10-02: 30 mg via INTRAVENOUS
  Administered 2023-10-02: 150 ug/kg/min via INTRAVENOUS
  Administered 2023-10-02: 150 mg via INTRAVENOUS
  Administered 2023-10-02: 50 mg via INTRAVENOUS

## 2023-10-02 MED ORDER — PROPOFOL 10 MG/ML IV BOLUS
INTRAVENOUS | Status: AC
  Filled 2023-10-02: qty 20

## 2023-10-02 MED ORDER — DEXAMETHASONE SODIUM PHOSPHATE 10 MG/ML IJ SOLN
INTRAMUSCULAR | Status: DC | PRN
  Administered 2023-10-02: 10 mg via INTRAVENOUS

## 2023-10-02 MED ORDER — KETOROLAC TROMETHAMINE 15 MG/ML IJ SOLN
15.0000 mg | Freq: Once | INTRAMUSCULAR | Status: AC
  Administered 2023-10-02: 15 mg via INTRAVENOUS

## 2023-10-02 MED ORDER — SUCCINYLCHOLINE CHLORIDE 200 MG/10ML IV SOSY
PREFILLED_SYRINGE | INTRAVENOUS | Status: DC | PRN
  Administered 2023-10-02: 100 mg via INTRAVENOUS

## 2023-10-02 MED ORDER — ACETAMINOPHEN 10 MG/ML IV SOLN
1000.0000 mg | Freq: Once | INTRAVENOUS | Status: DC | PRN
  Administered 2023-10-02: 1000 mg via INTRAVENOUS

## 2023-10-02 MED ORDER — DEXAMETHASONE SODIUM PHOSPHATE 10 MG/ML IJ SOLN
INTRAMUSCULAR | Status: AC
  Filled 2023-10-02: qty 1

## 2023-10-02 MED ORDER — CHLORHEXIDINE GLUCONATE 0.12 % MT SOLN
OROMUCOSAL | Status: AC
  Filled 2023-10-02: qty 15

## 2023-10-02 MED ORDER — PHENYLEPHRINE 80 MCG/ML (10ML) SYRINGE FOR IV PUSH (FOR BLOOD PRESSURE SUPPORT)
PREFILLED_SYRINGE | INTRAVENOUS | Status: DC | PRN
  Administered 2023-10-02: 80 ug via INTRAVENOUS

## 2023-10-02 SURGICAL SUPPLY — 32 items
BNDG COHESIVE 4X5 TAN STRL LF (GAUZE/BANDAGES/DRESSINGS) ×1 IMPLANT
BNDG ELASTIC 2INX 5YD STR LF (GAUZE/BANDAGES/DRESSINGS) ×1 IMPLANT
BNDG ELASTIC 2X5.8 VLCR NS LF (GAUZE/BANDAGES/DRESSINGS) IMPLANT
BNDG ESMARCH 4X12 STRL LF (GAUZE/BANDAGES/DRESSINGS) ×1 IMPLANT
CHLORAPREP W/TINT 26 (MISCELLANEOUS) ×1 IMPLANT
CORD BIP STRL DISP 12FT (MISCELLANEOUS) ×1 IMPLANT
CUFF TOURN SGL QUICK 18X4 (TOURNIQUET CUFF) ×1 IMPLANT
DRAPE SURG 17X11 SM STRL (DRAPES) ×1 IMPLANT
DRSG XEROFORM 1X8 (GAUZE/BANDAGES/DRESSINGS) IMPLANT
FORCEPS JEWEL BIP 4-3/4 STR (INSTRUMENTS) ×1 IMPLANT
GAUZE SPONGE 4X4 12PLY STRL (GAUZE/BANDAGES/DRESSINGS) ×1 IMPLANT
GAUZE XEROFORM 1X8 LF (GAUZE/BANDAGES/DRESSINGS) ×1 IMPLANT
GLOVE BIO SURGEON STRL SZ8 (GLOVE) ×1 IMPLANT
GLOVE INDICATOR 8.0 STRL GRN (GLOVE) ×1 IMPLANT
GOWN STRL REUS W/ TWL LRG LVL3 (GOWN DISPOSABLE) ×1 IMPLANT
GOWN STRL REUS W/ TWL XL LVL3 (GOWN DISPOSABLE) ×1 IMPLANT
HOLSTER ELECTROSUGICAL PENCIL (MISCELLANEOUS) IMPLANT
KIT ESCP INSRT D SLOT CANN KN (MISCELLANEOUS) ×1 IMPLANT
KIT TURNOVER KIT A (KITS) ×1 IMPLANT
MANIFOLD NEPTUNE II (INSTRUMENTS) ×1 IMPLANT
NS IRRIG 500ML POUR BTL (IV SOLUTION) ×1 IMPLANT
PACK EXTREMITY ARMC (MISCELLANEOUS) ×1 IMPLANT
SPLINT WRIST LG LT TX990309 (SOFTGOODS) IMPLANT
SPLINT WRIST LG RT TX900304 (SOFTGOODS) IMPLANT
SPLINT WRIST M LT TX990308 (SOFTGOODS) IMPLANT
SPLINT WRIST M RT TX990303 (SOFTGOODS) IMPLANT
SPLINT WRIST XL LT TX990310 (SOFTGOODS) IMPLANT
SPLINT WRIST XL RT TX990305 (SOFTGOODS) IMPLANT
STOCKINETTE IMPERVIOUS 9X36 MD (GAUZE/BANDAGES/DRESSINGS) ×1 IMPLANT
SUT PROLENE 4 0 PS 2 18 (SUTURE) ×1 IMPLANT
TRAP FLUID SMOKE EVACUATOR (MISCELLANEOUS) ×1 IMPLANT
WATER STERILE IRR 500ML POUR (IV SOLUTION) IMPLANT

## 2023-10-02 NOTE — Discharge Instructions (Addendum)
 Orthopedic discharge instructions: Keep dressing dry and intact. Keep hand elevated above heart level. May shower after dressing removed on postop day 4 (Sunday). Cover sutures with Band-Aids after drying off, then reapply Velcro splint. Apply ice to affected area frequently. Take ibuprofen 400-600 mg TID with meals for 3-5 days, then as necessary. Take ES Tylenol or pain medication as prescribed when needed.  May resume baby aspirin daily as prescribed as of tomorrow. Return for follow-up in 10-14 days or as scheduled.

## 2023-10-02 NOTE — Anesthesia Procedure Notes (Deleted)
 Procedure Name: Intubation Date/Time: 10/02/2023 11:58 AM  Performed by: Lattie Poli, MDPre-anesthesia Checklist: Patient identified, Patient being monitored, Timeout performed, Emergency Drugs available and Suction available Patient Re-evaluated:Patient Re-evaluated prior to induction Oxygen Delivery Method: Circle system utilized Preoxygenation: Pre-oxygenation with 100% oxygen Induction Type: IV induction Ventilation: Mask ventilation without difficulty Laryngoscope Size: Mac and 3 Grade View: Grade I Tube type: Oral Tube size: 6.5 mm Number of attempts: 1 Airway Equipment and Method: Stylet Placement Confirmation: ETT inserted through vocal cords under direct vision, positive ETCO2 and breath sounds checked- equal and bilateral Secured at: 20 cm Tube secured with: Tape Dental Injury: Teeth and Oropharynx as per pre-operative assessment  Comments: Initial attempt at LMA placement unsuccessful, with poor seal, patient requiring high amounts of induction agents. Successful conversion to GETA

## 2023-10-02 NOTE — H&P (Signed)
 History of Present Illness:  Maria Davies is an 83 y.o. female who presents for follow-up of her right hand pain and paresthesias secondary to presumed carpal tunnel syndrome. The patient was last seen for these symptoms nearly 8 weeks ago. She notes little change in her symptoms since her last visit. She continues to experience difficulty with grasping or holding of objects. Her symptoms will awaken her from sleep. Since her last visit, she has undergone an EMG and presents today to review these results.  Current Outpatient Medications:  amLODIPine (NORVASC) 5 MG tablet Take 1 tablet (5 mg total) by mouth every morning 30 tablet 11  aspirin 81 MG EC tablet Take 81 mg by mouth every morning.  B.ANI/L.ACI/L.SAL/L.PLAN/L.CAS (PROBIOTIC FORMULA ORAL) Take by mouth once daily  benazepriL (LOTENSIN) 20 MG tablet Take 20 mg by mouth once daily 30 tablet 11  cyanocobalamin (VITAMIN B12) 1000 MCG tablet Take by mouth  hydroCHLOROthiazide (HYDRODIURIL) 12.5 MG tablet Take 12.5 mg by mouth once daily  hydroCHLOROthiazide (HYDRODIURIL) 25 MG tablet Take 1 tablet by mouth once daily  ibuprofen (MOTRIN) 200 MG tablet Take 200 mg by mouth once daily  methIMAzole (TAPAZOLE) 5 MG tablet Take 5 mg once daily  metoprolol succinate (TOPROL-XL) 50 MG XL tablet Take 50 mg by mouth once daily  multivitamin tablet Take 1 tablet by mouth once daily.   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   Past Surgical History:  MASTECTOMY, PARTIAL Right 2010 (With sentinel lymph node biopsy)  EXTRACTION CATARACT EXTRACAPSULAR Left 08/11/2015  EXTRACTION CATARACT EXTRACAPSULAR W/INSERTION INTRAOCULAR PROSTHESIS Right 08/25/2015  TRANSCATHETER AORTIC VALVE REPLACEMENT Right 08/31/2020  Procedure: TRANSCATHETER AORTIC VALVE REPLACEMENT (SAPIEN) WITH PROSTHETIC VALVE; OPEN FEMORAL ARTERY APPROACH; Sapien Ultra valve commercial; Surgeon: Narang Bora, MD; Location: [facility]PERATING RO[facility]S; Service: Cardiothoracic; Laterality: Right;  TRANSCATHETER AORTIC VALVE REPLACEMENT Right 08/31/2020  Procedure: TRANSCATHETER AORTIC VALVE REPLACEMENT (SAPIEN) WITH PROSTHETIC VALVE; OPEN FEMORAL ARTERY APPROACH; Sapien Ultra valve, commercial; Surgeon: Christel Cousins, MD; Location: [facility]; Service: Cardiothoracic; Laterality: Right;  pacemaker insertion surgery 09/02/2020 ([employer]tr[employer] Azure XT DR MRI SureScan)  Reverse right total shoulder arthroplasty Right 02/12/2023 (Dr. Daun Epstein)  back surgery  CHOLECYSTECTOMY  firbriod removed   Family History:  Pancreatic cancer Mother  Glaucoma Neg Hx  Macular degeneration Neg Hx  Diabetes Neg Hx  Anesthesia problems Neg Hx  Blindness Neg Hx  Cataracts Neg Hx  Retinal degeneration Neg Hx   Social History:   Socioeconomic History:  Marital status: Widowed  Tobacco Use  Smoking status: Never  Smokeless tobacco: Never  Vaping Use  Vaping status: Never Used  Substance and Sexual Activity  Alcohol use: Yes  Alcohol/week: 3.0 standard drinks of alcohol  Types: 3 Shots of liquor per week  Comment: a couple cocktails nightly  Drug use: Never  Sexual activity: Defer  Social History Narrative  Widowed with grown children   Social Drivers of Health:   Surveyor, quantity  Resource Strain: Low Risk (09/10/2022)  Received from  [facility], [place]  Overall Financial Resource Strain (CARDIA)  Difficulty of Paying Living Expenses: Not hard at all  Food Insecurity: No Food Insecurity (02/08/2023)  Received from [facility]  Hunger Vital Sign  Worried About Running Out of Food in the Last Year: Never true  Ran Out of Food in the Last Year: Never true  Transportation Needs: No Transportation Needs (02/08/2023)  Received from [facility] - Transportation  Lack of Transportation (Medical): No  Lack of Transportation (Non-Medical): No   Review of Systems:  A comprehensive 14 point ROS was performed, reviewed, and the pertinent orthopaedic findings are documented in the HPI.  Physical Exam: Vitals:  08/19/23 1008  BP: 110/72  Weight: 89.3 kg (196 lb 12.8 oz)  Height: 160 cm (5\' 3" )  PainSc: 0-No pain  PainLoc: Hand   General/Constitutional: The patient appears to be well-nourished, well-developed, and in no acute distress. Neuro/Psych: Normal mood and affect, oriented to person, place and time. Eyes: Non-icteric. Pupils are equal, round, and reactive to light, and exhibit synchronous movement. ENT: Unremarkable. Lymphatic: No palpable adenopathy. Respiratory: Lungs clear to auscultation, Normal chest excursion, No wheezes, and Non-labored breathing Cardiovascular: Regular rate and rhythm. No murmurs. and No edema, swelling or tenderness, except as noted in detailed exam. Integumentary: No impressive skin lesions present, except as noted in detailed exam. Musculoskeletal: Unremarkable, except as noted in detailed exam.  Right hand exam: Skin inspection of the right hand again is notable for minimal residual swelling around the DIP joint of the right index finger but otherwise is unremarkable. No erythema, ecchymosis, abrasions, or other skin abnormalities are identified. She denies any tenderness around the wrist or hand, including along the index finger. She can flex and extend her wrist without any  pain or catching. She can actively flex and extend all digits, although she continues to experience limited flexion of the index finger. Passively, the index MCP joint can be ranged from 0 to 85 degrees whereas the PIP joint can be ranged from 0 to 85 degrees. The DIP joint shows no significant active or passive range of motion, but has no tenderness to palpation or stressing. She demonstrates 3+-4/5 strength with grip strength testing as well as with intrinsic testing. Sensation remains grossly intact to light touch to all distributions. She has good capillary refill to all digits. She exhibits an equivocally positive Tinel's over the carpal tunnel, but a negative Phalen's test.  EMG results:  The results of her recent EMG are available for review and have been reviewed by myself. By report, the study demonstrates evidence of "severe (grade IV) carpal tunnel syndrome". This report was reviewed by myself and discussed with the patient.  Assessment: Carpal tunnel syndrome, right.   Plan: The treatment options were discussed with the patient. In addition, patient educational materials were provided regarding the diagnosis and treatment options. Regarding her right carpal tunnel symptoms, the patient is quite frustrated by these symptoms and functional limitations, and is ready to consider more aggressive treatment options. Therefore, I have recommended a surgical procedure, specifically an endoscopic right carpal tunnel release. The procedure was discussed with the patient, as were the potential risks (including bleeding, infection, nerve and/or blood vessel injury, persistent or recurrent pain/paresthesias, weakness of grip, continued stiffness of the index finger, need for further surgery, blood clots, strokes, heart attacks and/or arhythmias, pneumonia, etc.) and benefits. The patient states her understanding and wishes to proceed. All  of the patient's questions and concerns were answered. She can call any  time with further concerns. She will follow up post-surgery, routine.    H&P reviewed and patient re-examined. No changes.

## 2023-10-02 NOTE — Op Note (Signed)
 10/02/2023  12:29 PM  Patient:   Maria Davies  Pre-Op Diagnosis:   Right carpal tunnel syndrome.  Post-Op Diagnosis:   Same.  Procedure:   Endoscopic right carpal tunnel release.  Surgeon:   Lonnie Roberts, MD  Anesthesia:   General LMA  Findings:   As above.  Complications:   None  EBL:   0 cc  Fluids:   200 cc crystalloid  TT:   14 minutes at 250 mmHg  Drains:   None  Closure:   4-0 Prolene interrupted sutures  Brief Clinical Note:   The patient is a 83 year old female with a history of progressive worsening pain and paresthesias to her right hand. Her symptoms have progressed despite medications, activity modification, etc. Her history and examination are consistent with carpal tunnel syndrome, confirmed by EMG. The patient presents at this time for an endoscopic right carpal tunnel release.   Procedure:   The patient was brought into the operating room and lain in the supine position. After adequate general laryngeal mask anesthesia was obtained, the right hand and upper extremity were prepped with ChloraPrep solution before being draped sterilely. Preoperative antibiotics were administered. A timeout was performed to verify the appropriate surgical site before the limb was exsanguinated with an Esmarch and the tourniquet inflated to 250 mmHg.   An approximately 1.5-2 cm incision was made over the volar wrist flexion crease, centered over the palmaris longus tendon. The incision was carried down through the subcutaneous tissues with care taken to identify and protect any neurovascular structures. The distal forearm fascia was penetrated just proximal to the transverse carpal ligament. The soft tissues were released off the superficial and deep surfaces of the distal forearm fascia and this was released proximally for 3-4 cm under direct visualization.  Attention was directed distally. The Therapist, nutritional was passed beneath the transverse carpal ligament along the ulnar  aspect of the carpal tunnel and used to release any adhesions as well as to remove any adherent synovial tissue before first the smaller then the larger of the two dilators were passed beneath the transverse carpal ligament along the ulnar margin of the carpal tunnel. The slotted cannula was introduced and the endoscope was placed into the slotted cannula and the undersurface of the transverse carpal ligament visualized. The distal margin of the transverse carpal ligament was marked by placing a 25-gauge needle percutaneously at Kaplan's cardinal point so that it entered the distal portion of the slotted cannula. Under endoscopic visualization, the transverse carpal ligament was released from proximal to distal using the end-cutting blade. A second pass was performed to ensure complete release of the ligament. The adequacy of release was verified both endoscopically and by palpation using the freer elevator.  The wound was irrigated thoroughly with sterile saline solution before being closed using 4-0 Prolene interrupted sutures. A total of 10 cc of 0.5% plain Sensorcaine was injected in and around the incision before a sterile bulky dressing was applied to the wound. The patient was placed into a volar wrist splint before being awakened, extubated, and returned to the recovery room in satisfactory condition after tolerating the procedure well.

## 2023-10-02 NOTE — Anesthesia Procedure Notes (Signed)
 Procedure Name: Intubation Date/Time: 10/02/2023 11:33 AM  Performed by: Aniceto Kern, RNPre-anesthesia Checklist: Patient identified, Patient being monitored, Timeout performed, Emergency Drugs available and Suction available Patient Re-evaluated:Patient Re-evaluated prior to induction Oxygen Delivery Method: Circle system utilized Preoxygenation: Pre-oxygenation with 100% oxygen Induction Type: IV induction Ventilation: Mask ventilation without difficulty Laryngoscope Size: 3 and McGrath Grade View: Grade I Tube type: Oral Tube size: 7.0 mm Number of attempts: 1 Airway Equipment and Method: Stylet and Video-laryngoscopy Placement Confirmation: ETT inserted through vocal cords under direct vision, positive ETCO2 and breath sounds checked- equal and bilateral Secured at: 21 cm Tube secured with: Tape Dental Injury: Teeth and Oropharynx as per pre-operative assessment  Comments: LMA #4 inserted first; unable to adequately ventilate, evidenced by poor tidal volumes. Removed LMA and provided jaw thrust and PPV due to concern for spasm with insertion of first LMA. Attempted LMA insertion again with same result. Decision made to convert to general ETT with video laryngoscopy. Intubation atraumatic. ETCO2 and adequate tidal volumes noted. Patient vital signs stable throughout.

## 2023-10-02 NOTE — Anesthesia Postprocedure Evaluation (Signed)
 Anesthesia Post Note  Patient: Maria Davies  Procedure(s) Performed: RELEASE, CARPAL TUNNEL, ENDOSCOPIC (Right: Wrist)  Patient location during evaluation: PACU Anesthesia Type: General Level of consciousness: awake and alert Pain management: pain level controlled Vital Signs Assessment: post-procedure vital signs reviewed and stable Respiratory status: spontaneous breathing, nonlabored ventilation, respiratory function stable and patient connected to nasal cannula oxygen Cardiovascular status: blood pressure returned to baseline and stable Postop Assessment: no apparent nausea or vomiting Anesthetic complications: no   No notable events documented.   Last Vitals:  Vitals:   10/02/23 1233 10/02/23 1245  BP:  114/66  Pulse: 73 74  Resp: 11 16  Temp:    SpO2: 95% 96%    Last Pain:  Vitals:   10/02/23 1245  TempSrc:   PainSc: 0-No pain                 Lattie Poli

## 2023-10-02 NOTE — Anesthesia Preprocedure Evaluation (Addendum)
 Anesthesia Evaluation  Patient identified by MRN, date of birth, ID band Patient awake    Reviewed: Allergy & Precautions, NPO status , Patient's Chart, lab work & pertinent test results  History of Anesthesia Complications (+) PONV and history of anesthetic complications  Airway Mallampati: III  TM Distance: >3 FB Neck ROM: full    Dental  (+) Chipped, Dental Advidsory Given   Pulmonary neg pulmonary ROS, neg sleep apnea, neg COPD, Patient abstained from smoking.Not current smoker   Pulmonary exam normal breath sounds clear to auscultation       Cardiovascular Exercise Tolerance: Good hypertension, Pt. on medications + CAD and +CHF  (-) Past MI Normal cardiovascular exam+ dysrhythmias + pacemaker + Valvular Problems/Murmurs  Rhythm:Regular Rate:Normal - Systolic murmurs S/p TAVR  Most recent TTE performed on 02/08/2023 revealed a normal left ventricular systolic function with an EF of 60-65%. There was mild LVH.  There were no regional wall motion abnormalities. Left ventricular diastolic Doppler parameters consistent with abnormal relaxation (G1DD). Left atrium mildly dilated. Right ventricular cavity size mildly enlarged with normal function normal. There was trivial tricuspid and mild mitral valve regurgitation. Bioprosthetic aortic valve well seated and functioning properly. Mean trans-aortic valve gradient elevated at 16.5 mmHg; AVA (VTI) = 1.18 cm2. All remaining transvalvular gradients were noted to be normal providing no evidence suggestive of valvular stenosis. Aorta normal in size with no evidence of ectasia or aneurysmal dilatation.   Neuro/Psych negative neurological ROS  negative psych ROS   GI/Hepatic negative GI ROS,neg GERD  ,,(+)     substance abuse  alcohol useDrinks 2-3 scotches per day   Endo/Other  neg diabetes Hyperthyroidism   Renal/GU      Musculoskeletal   Abdominal   Peds  Hematology negative  hematology ROS (+)   Anesthesia Other Findings Past Medical History: 10/20/2019: Adenocarcinoma of colon (HCC)     Comment:  a.) stage IIIb; b.) RIGHT colon mass Bx (+) for invasive              moderately differentiated adenocarcinoma (pT3 pN1a) No date: Anemia No date: Aortic atherosclerosis (HCC) No date: Aortic stenosis     Comment:  a.) TTE 08/17/2016: EF >55%, mild AS (MPG 29.5); b.) TTE              08/25/2018: EF >55%; mod AS (MPG 42.3); c.) TTE               06/17/2020: EF >55%; severe AS (MPG 47.2); d.) s/p TAVR               08/31/2020 at [facility] - 23 mm Edwards Sapien 3 Ultra valve               placed via a RIGHT percutaneous femoral approach -->               complicated by intra/postoperative CHB requiring TVP and               ultimate PPM placement No date: Arthritis No date: Basal cell carcinoma (BCC) of back No date: Bilateral leg edema 2011: Breast cancer, right (HCC)     Comment:  a.) stage Ia IMC (ER/PR +, HER2/neu -); b.) lumpectomy +              XRT in 2011; c.) completed 5 years AI therapy               (anastrozole) in 11/2014 No date: Cervical spinal stenosis No date: Cervical spondylosis  No date: Chemotherapy induced nausea and vomiting 08/31/2020: Complete heart block (HCC)     Comment:  a.) TVP placed intraoperatively during TAVR secondary to              development of CHB; b.) Medtronic PPM placed 09/03/2020               for postoperative continued postoperative CHB No date: Coronary artery disease     Comment:  a.) RHC 08/11/2020: no sig CAD No date: DDD (degenerative disc disease), cervical No date: Diastolic dysfunction     Comment:  a.) TTE 08/17/2016: EF >55%, mild LVH, mild LAE, triv               panvalvular regurg, G1DD; b.) TTE 08/25/2018: EF >55%,               mod LVH, mild LAE, triv AR/TR/PR, mild MR, G1dd; c.) TTE               08/11/2020: EF >55%, mild LVHH, mod LAE, triv MR/TR 03/2021: Guillain Barr syndrome (HCC) No date: Heart  murmur No date: History of bilateral cataract extraction No date: Hyperplastic colon polyp No date: Hypertension No date: Hyperthyroidism No date: Neuropathy No date: Osteopenia No date: PONV (postoperative nausea and vomiting) 09/03/2020: Presence of permanent cardiac pacemaker     Comment:  a.) TVP placed intraoperatively during TAVR secondary to              development of CHB; b.) Medtronic PPM placed 09/03/2020               for postoperative continued postoperative CHB 08/31/2020: S/P TAVR (transcatheter aortic valve replacement)     Comment:  a.) s/p TAVR 08/31/2020 at [facility] - 23 mm Edwards Sapien 3              Ultra valve placed via a RIGHT percutaneous femoral               approach --> complicated by intra/postoperative CHB               requiring TVP and ultimate PPM placement 01/25/2022: Thyroid nodule     Comment:  a.) CT C-spine 01/25/2022: 22 mm right thyroid nodule  Past Surgical History: No date: BACK SURGERY 03/2009: BREAST EXCISIONAL BIOPSY; Right No date: BREAST EXCISIONAL BIOPSY; Bilateral     Comment:  NEG No date: CATARACT EXTRACTION W/ INTRAOCULAR LENS  IMPLANT, BILATERAL No date: CHOLECYSTECTOMY 10/19/2019: COLONOSCOPY WITH PROPOFOL; N/A     Comment:  Procedure: COLONOSCOPY WITH PROPOFOL-attempted, unable               to perform poor prep;  Surgeon: Marnee Sink, MD;                Location: [facility];  Service: Endoscopy;                Laterality: N/A;  priority 3 10/20/2019: COLONOSCOPY WITH PROPOFOL; N/A     Comment:  Procedure: COLONOSCOPY WITH PROPOFOL;  Surgeon: Marnee Sink, MD;  Location: [facility] ENDOSCOPY;  Service:               Endoscopy;  Laterality: N/A; 11/16/2021: COLONOSCOPY WITH PROPOFOL; N/A     Comment:  Procedure: COLONOSCOPY WITH PROPOFOL;  Surgeon: Marnee Sink, MD;  Location: [facility]  ENDOSCOPY;  Service:               Endoscopy;  Laterality: N/A; 10/19/2019: ESOPHAGEAL DILATION     Comment:   Procedure: ESOPHAGEAL DILATION;  Surgeon: Marnee Sink,               MD;  Location: [facility];  Service: Endoscopy;; 10/19/2019: ESOPHAGOGASTRODUODENOSCOPY (EGD) WITH PROPOFOL; N/A     Comment:  Procedure: ESOPHAGOGASTRODUODENOSCOPY (EGD) WITH               PROPOFOL;  Surgeon: Marnee Sink, MD;  Location: [facility];  Service: Endoscopy;  Laterality: N/A; 03/20/2022: FLEXIBLE SIGMOIDOSCOPY; N/A     Comment:  Procedure: FLEXIBLE SIGMOIDOSCOPY;  Surgeon: Marnee Sink, MD;  Location: [facility] ENDOSCOPY;  Service:               Endoscopy;  Laterality: N/A; No date: HEMORRHOID SURGERY 2010: MASTECTOMY, PARTIAL; Right 09/03/2020: PACEMAKER INSERTION; N/A     Comment:  Procedure: PACEMAKER WITH LEFT BUNDLE LEAD (MEDTRONIC);               Location: [facility]; Surgeon: Lennette Quiver, MD 2021Sammy Crisp REMOVAL 11/23/2019: PORTACATH PLACEMENT; Right     Comment:  Procedure: INSERTION PORT-A-CATH;  Surgeon: Flynn Hylan, MD;  Location: [facility] ORS;  Service: General;                Laterality: Right; 10/2019: RIGHT COLECTOMY; Right     Comment:  Procedure: ROBOTIC ASSISTED TIGHT HEMICOLECTOMY 08/11/2020: RIGHT/LEFT HEART CATH AND CORONARY ANGIOGRAPHY; Right     Comment:  Procedure: RIGHT/LEFT HEART CATH AND CORONARY               ANGIOGRAPHY; Location: [facility]; Surgeon: Rockne Chyle, MD 08/31/2020: TRANSCATHETER AORTIC VALVE REPLACEMENT, TRANSFEMORAL;  Right     Comment:  Procedure: TRANSCATHETER AORTIC VALVE REPLACEMENT, RIGHT              TRANSFEMORAL; Location: [facility]; Surgeon: Forbes Ida, MD 04/11/2022: TUMOR EXCISION; N/A     Comment:  Procedure: TUMOR EXCISION RECTAL;  Surgeon: Flynn Hylan, MD;  Location: [facility] ORS;  Service: General;                Laterality: N/A;  BMI    Body Mass Index: 34.67 kg/m      Reproductive/Obstetrics negative OB ROS                             Anesthesia  Physical Anesthesia Plan  ASA: 3  Anesthesia Plan: General   Post-op Pain Management: Ofirmev IV (intra-op)*   Induction: Intravenous  PONV Risk Score and Plan: 4 or greater and TIVA, Propofol infusion, Ondansetron, Dexamethasone and Treatment may vary due to age or medical condition  Airway Management Planned: LMA  Additional Equipment: None  Intra-op Plan:   Post-operative Plan: Extubation in OR  Informed Consent: I have reviewed the patients History and Physical, chart, labs and discussed the procedure including the risks, benefits and alternatives for the proposed anesthesia with the patient or authorized representative who has indicated his/her understanding and acceptance.     Dental Advisory Given  Plan Discussed  with: Anesthesiologist, CRNA and Surgeon  Anesthesia Plan Comments: (Disucssed r/b/a of regional vs general anesthesia. Patient ultimately elected for general. Discussed risks of anesthesia with patient, including PONV, sore throat, lip/dental/eye damage. Rare risks discussed as well, such as cardiorespiratory and neurological sequelae, and allergic reactions. Discussed the role of CRNA in patient's perioperative care. Patient understands.)       Anesthesia Quick Evaluation

## 2023-10-02 NOTE — Transfer of Care (Signed)
 Immediate Anesthesia Transfer of Care Note  Patient: Maria Davies  Procedure(s) Performed: RELEASE, CARPAL TUNNEL, ENDOSCOPIC (Right: Wrist)  Patient Location: PACU  Anesthesia Type:General  Level of Consciousness: awake  Airway & Oxygen Therapy: Patient Spontanous Breathing  Post-op Assessment: Post -op Vital signs reviewed and stable  Post vital signs: Reviewed and stable  Last Vitals:  Vitals Value Taken Time  BP 114/63 10/02/23 1219  Temp 36.8 C 10/02/23 1224  Pulse 82 10/02/23 1224  Resp 22 10/02/23 1224  SpO2 96 % 10/02/23 1224  Vitals shown include unfiled device data.  Last Pain:  Vitals:   10/02/23 1219  TempSrc:   PainSc: Asleep         Complications: No notable events documented.

## 2023-10-03 ENCOUNTER — Other Ambulatory Visit: Payer: Self-pay | Admitting: Family Medicine

## 2023-10-03 ENCOUNTER — Encounter: Payer: Self-pay | Admitting: Surgery

## 2023-10-04 NOTE — Telephone Encounter (Signed)
 Requested medications are due for refill today.  yes  Requested medications are on the active medications list.  yes  Last refill. 05/08/2023 #90 1 rf  Future visit scheduled.   yes  Notes to clinic.  New abnormal labs. Please review for refill.    Requested Prescriptions  Pending Prescriptions Disp Refills   benazepril  (LOTENSIN ) 20 MG tablet [Pharmacy Med Name: BENAZEPRIL  HCL 20 MG TAB] 90 tablet 1    Sig: TAKE 1 TABLET BY MOUTH ONCE EVERY MORNING     Cardiovascular:  ACE Inhibitors Failed - 10/04/2023  9:51 AM      Failed - K in normal range and within 180 days    Potassium  Date Value Ref Range Status  10/02/2023 3.4 (L) 3.5 - 5.1 mmol/L Final  08/15/2012 4.3 3.5 - 5.1 mmol/L Final         Passed - Cr in normal range and within 180 days    Creatinine  Date Value Ref Range Status  08/15/2012 0.65 0.60 - 1.30 mg/dL Final   Creatinine, Ser  Date Value Ref Range Status  10/02/2023 0.50 0.44 - 1.00 mg/dL Final         Passed - Patient is not pregnant      Passed - Last BP in normal range    BP Readings from Last 1 Encounters:  10/02/23 134/60         Passed - Valid encounter within last 6 months    Recent Outpatient Visits   None

## 2023-10-09 DIAGNOSIS — E0590 Thyrotoxicosis, unspecified without thyrotoxic crisis or storm: Secondary | ICD-10-CM | POA: Diagnosis not present

## 2023-10-15 DIAGNOSIS — E0590 Thyrotoxicosis, unspecified without thyrotoxic crisis or storm: Secondary | ICD-10-CM | POA: Diagnosis not present

## 2023-10-30 DIAGNOSIS — G5601 Carpal tunnel syndrome, right upper limb: Secondary | ICD-10-CM | POA: Diagnosis not present

## 2023-11-05 DIAGNOSIS — G5601 Carpal tunnel syndrome, right upper limb: Secondary | ICD-10-CM | POA: Diagnosis not present

## 2023-11-07 DIAGNOSIS — G5601 Carpal tunnel syndrome, right upper limb: Secondary | ICD-10-CM | POA: Diagnosis not present

## 2023-11-11 ENCOUNTER — Other Ambulatory Visit: Payer: Self-pay | Admitting: Family Medicine

## 2023-11-11 DIAGNOSIS — I10 Essential (primary) hypertension: Secondary | ICD-10-CM

## 2023-11-12 DIAGNOSIS — G5601 Carpal tunnel syndrome, right upper limb: Secondary | ICD-10-CM | POA: Diagnosis not present

## 2023-11-13 NOTE — Telephone Encounter (Unsigned)
 Copied from CRM [id number]. Topic: Clinical - Medication Refill >> Nov 13, 2023  9:26 AM Tiffany S wrote: Medication: amLODipine  (NORVASC ) 5 MG tablet [[id number]]  Has the patient contacted their pharmacy? Yes (Agent: If no, request that the patient contact the pharmacy for the refill. If patient does not wish to contact the pharmacy document the reason why and proceed with request.) (Agent: If yes, when and what did the pharmacy advise?)  This is the patient's preferred pharmacy:  [facility] - [place], [place] - [address] [place] [address] Phone: [phone] Fax: [phone]  Is this the correct pharmacy for this prescription? Yes If no, delete pharmacy and type the correct one.   Has the prescription been filled recently? Yes  Is the patient out of the medication? Yes  Has the patient been seen for an appointment in the last year OR does the patient have an upcoming appointment? Yes  Can we respond through MyChart? Yes  Agent: Please be advised that Rx refills may take up to 3 business days. We ask that you follow-up with your pharmacy.

## 2023-11-14 NOTE — Telephone Encounter (Signed)
 Too soon for refill for Hydrodiurl.  Requested Prescriptions  Pending Prescriptions Disp Refills   amLODipine  (NORVASC ) 5 MG tablet [Pharmacy Med Name: AMLODIPINE  BESYLATE 5 MG TAB] 90 tablet 0    Sig: TAKE 1 TABLET BY MOUTH ONCE EVERY MORNING     Cardiovascular: Calcium  Channel Blockers 2 Passed - 11/14/2023 11:57 AM      Passed - Last BP in normal range    BP Readings from Last 1 Encounters:  10/02/23 134/60         Passed - Last Heart Rate in normal range    Pulse Readings from Last 1 Encounters:  10/02/23 69         Passed - Valid encounter within last 6 months    Recent Outpatient Visits   None             hydrochlorothiazide  (HYDRODIURIL ) 25 MG tablet [Pharmacy Med Name: HYDROCHLOROTHIAZIDE  25 MG TAB] 90 tablet 1    Sig: TAKE 1 TABLET BY MOUTH ONCE DAILY     Cardiovascular: Diuretics - Thiazide Failed - 11/14/2023 11:57 AM      Failed - K in normal range and within 180 days    Potassium  Date Value Ref Range Status  10/02/2023 3.4 (L) 3.5 - 5.1 mmol/L Final  08/15/2012 4.3 3.5 - 5.1 mmol/L Final         Failed - Na in normal range and within 180 days    Sodium  Date Value Ref Range Status  10/02/2023 129 (L) 135 - 145 mmol/L Final  07/05/2023 135 134 - 144 mmol/L Final  08/15/2012 141 136 - 145 mmol/L Final         Passed - Cr in normal range and within 180 days    Creatinine  Date Value Ref Range Status  08/15/2012 0.65 0.60 - 1.30 mg/dL Final   Creatinine, Ser  Date Value Ref Range Status  10/02/2023 0.50 0.44 - 1.00 mg/dL Final         Passed - Last BP in normal range    BP Readings from Last 1 Encounters:  10/02/23 134/60         Passed - Valid encounter within last 6 months    Recent Outpatient Visits   None

## 2023-11-15 DIAGNOSIS — G5601 Carpal tunnel syndrome, right upper limb: Secondary | ICD-10-CM | POA: Diagnosis not present

## 2023-11-22 DIAGNOSIS — G5601 Carpal tunnel syndrome, right upper limb: Secondary | ICD-10-CM | POA: Diagnosis not present

## 2023-11-25 ENCOUNTER — Other Ambulatory Visit: Payer: Self-pay | Admitting: Family Medicine

## 2023-11-25 DIAGNOSIS — I10 Essential (primary) hypertension: Secondary | ICD-10-CM

## 2023-11-26 DIAGNOSIS — G5601 Carpal tunnel syndrome, right upper limb: Secondary | ICD-10-CM | POA: Diagnosis not present

## 2023-11-26 NOTE — Telephone Encounter (Signed)
 Requested by interface surescripts. Future visit 01/02/24. Last OV 07/05/23.  Requested Prescriptions  Pending Prescriptions Disp Refills   metoprolol  succinate (TOPROL -XL) 50 MG 24 hr tablet [Pharmacy Med Name: METOPROLOL  SUCCINATE ER 50 MG TAB] 90 tablet 0    Sig: TAKE 1 TABLET BY MOUTH ONCE DAILY. WITH OR IMMEDIATELY FOLLOWING A MEAL     Cardiovascular:  Beta Blockers Passed - 11/26/2023 10:57 AM      Passed - Last BP in normal range    BP Readings from Last 1 Encounters:  10/02/23 134/60         Passed - Last Heart Rate in normal range    Pulse Readings from Last 1 Encounters:  10/02/23 69         Passed - Valid encounter within last 6 months    Recent Outpatient Visits   None

## 2023-11-29 DIAGNOSIS — G5601 Carpal tunnel syndrome, right upper limb: Secondary | ICD-10-CM | POA: Diagnosis not present

## 2023-11-30 DIAGNOSIS — M1711 Unilateral primary osteoarthritis, right knee: Secondary | ICD-10-CM | POA: Insufficient documentation

## 2023-12-03 DIAGNOSIS — G5601 Carpal tunnel syndrome, right upper limb: Secondary | ICD-10-CM | POA: Diagnosis not present

## 2023-12-05 DIAGNOSIS — E0590 Thyrotoxicosis, unspecified without thyrotoxic crisis or storm: Secondary | ICD-10-CM | POA: Diagnosis not present

## 2023-12-12 DIAGNOSIS — E0590 Thyrotoxicosis, unspecified without thyrotoxic crisis or storm: Secondary | ICD-10-CM | POA: Diagnosis not present

## 2023-12-31 ENCOUNTER — Inpatient Hospital Stay: Payer: Medicare Other | Attending: Oncology

## 2024-01-02 ENCOUNTER — Encounter: Payer: Self-pay | Admitting: Family Medicine

## 2024-01-02 ENCOUNTER — Ambulatory Visit (INDEPENDENT_AMBULATORY_CARE_PROVIDER_SITE_OTHER): Admitting: Family Medicine

## 2024-01-02 VITALS — BP 126/78 | HR 98 | Temp 98.0°F | Wt 188.0 lb

## 2024-01-02 DIAGNOSIS — D500 Iron deficiency anemia secondary to blood loss (chronic): Secondary | ICD-10-CM | POA: Diagnosis not present

## 2024-01-02 DIAGNOSIS — E559 Vitamin D deficiency, unspecified: Secondary | ICD-10-CM | POA: Diagnosis not present

## 2024-01-02 DIAGNOSIS — I700 Atherosclerosis of aorta: Secondary | ICD-10-CM | POA: Diagnosis not present

## 2024-01-02 DIAGNOSIS — I10 Essential (primary) hypertension: Secondary | ICD-10-CM

## 2024-01-02 DIAGNOSIS — E7800 Pure hypercholesterolemia, unspecified: Secondary | ICD-10-CM

## 2024-01-02 DIAGNOSIS — R252 Cramp and spasm: Secondary | ICD-10-CM | POA: Diagnosis not present

## 2024-01-02 LAB — MICROALBUMIN, URINE WAIVED
Creatinine, Urine Waived: 300 mg/dL (ref 10–300)
Microalb, Ur Waived: 150 mg/L — ABNORMAL HIGH (ref 0–19)

## 2024-01-02 MED ORDER — METOPROLOL SUCCINATE ER 50 MG PO TB24: ORAL_TABLET | ORAL | 1 refills | Status: DC

## 2024-01-02 MED ORDER — HYDROCHLOROTHIAZIDE 25 MG PO TABS: 25.0000 mg | ORAL_TABLET | Freq: Every day | ORAL | 1 refills | Status: DC

## 2024-01-02 MED ORDER — AMLODIPINE BESYLATE 5 MG PO TABS: 5.0000 mg | ORAL_TABLET | Freq: Every day | ORAL | 1 refills | Status: DC

## 2024-01-02 MED ORDER — BENAZEPRIL HCL 20 MG PO TABS: 20.0000 mg | ORAL_TABLET | ORAL | 1 refills | Status: DC

## 2024-01-02 NOTE — Assessment & Plan Note (Signed)
 Under good control on current regimen. Continue current regimen. Continue to monitor. Call with any concerns. Refills given. Labs drawn today.

## 2024-01-02 NOTE — Assessment & Plan Note (Signed)
 Rechecking labs today. Await results. Treat as needed.

## 2024-01-02 NOTE — Assessment & Plan Note (Signed)
Will keep BP and cholesterol under good control. Continue to monitor.  

## 2024-01-02 NOTE — Progress Notes (Signed)
 BP 126/78   Pulse 98   Temp 98 F (36.7 C) (Oral)   Wt 188 lb (85.3 kg)   SpO2 98%   BMI 33.30 kg/m    Subjective:    Patient ID: Maria Davies, female    DOB: 23-Nov-1940, 83 y.o.   MRN: [id number]  HPI: Maria Davies is a 83 y.o. female  Chief Complaint  Patient presents with   Hypertension   HYPERTENSION / HYPERLIPIDEMIA Satisfied with current treatment? yes Duration of hypertension: chronic BP monitoring frequency: not checking BP medication side effects: no Past BP meds: hydrochlorothiazide , metoprolol , benazepril , amlodipine  Duration of hyperlipidemia: chronic Cholesterol medication side effects: N/A Cholesterol supplements: none Past cholesterol medications: none Medication compliance: excellent compliance Aspirin : yes Recent stressors: no Recurrent headaches: no Visual changes: no Palpitations: no Dyspnea: no Chest pain: no Lower extremity edema: no Dizzy/lightheaded: no  Relevant past medical, surgical, family and social history reviewed and updated as indicated. Interim medical history since our last visit reviewed. Allergies and medications reviewed and updated.  Review of Systems  Constitutional: Negative.   Cardiovascular: Negative.   Musculoskeletal: Negative.   Psychiatric/Behavioral: Negative.      Per HPI unless specifically indicated above     Objective:    BP 126/78   Pulse 98   Temp 98 F (36.7 C) (Oral)   Wt 188 lb (85.3 kg)   SpO2 98%   BMI 33.30 kg/m   Wt Readings from Last 3 Encounters:  01/02/24 188 lb (85.3 kg)  10/02/23 180 lb (81.6 kg)  09/26/23 160 lb (72.6 kg)    Physical Exam Vitals and nursing note reviewed.  Constitutional:      General: She is not in acute distress.    Appearance: Normal appearance. She is not ill-appearing, toxic-appearing or diaphoretic.  HENT:     Head: Normocephalic and atraumatic.     Right Ear: External ear normal.     Left Ear: External ear normal.     Nose: Nose normal.      Mouth/Throat:     Mouth: Mucous membranes are moist.     Pharynx: Oropharynx is clear.  Eyes:     General: No scleral icterus.       Right eye: No discharge.        Left eye: No discharge.     Extraocular Movements: Extraocular movements intact.     Conjunctiva/sclera: Conjunctivae normal.     Pupils: Pupils are equal, round, and reactive to light.  Cardiovascular:     Rate and Rhythm: Normal rate and regular rhythm.     Pulses: Normal pulses.     Heart sounds: Normal heart sounds. No murmur heard.    No friction rub. No gallop.  Pulmonary:     Effort: Pulmonary effort is normal. No respiratory distress.     Breath sounds: Normal breath sounds. No stridor. No wheezing, rhonchi or rales.  Chest:     Chest wall: No tenderness.  Musculoskeletal:        General: Normal range of motion.     Cervical back: Normal range of motion and neck supple.  Skin:    General: Skin is warm and dry.     Capillary Refill: Capillary refill takes less than 2 seconds.     Coloration: Skin is not jaundiced or pale.     Findings: No bruising, erythema, lesion or rash.  Neurological:     General: No focal deficit present.     Mental Status: She is  alert and oriented to person, place, and time. Mental status is at baseline.  Psychiatric:        Mood and Affect: Mood normal.        Behavior: Behavior normal.        Thought Content: Thought content normal.        Judgment: Judgment normal.     Results for orders placed or performed during the hospital encounter of 10/02/23  I-STAT, chem 8   Collection Time: 10/02/23  8:37 AM  Result Value Ref Range   Sodium 129 (L) 135 - 145 mmol/L   Potassium 3.4 (L) 3.5 - 5.1 mmol/L   Chloride 93 (L) 98 - 111 mmol/L   BUN 7 (L) 8 - 23 mg/dL   Creatinine, Ser 9.49 0.44 - 1.00 mg/dL   Glucose, Bld 821 (H) 70 - 99 mg/dL   Calcium , Ion 1.15 1.15 - 1.40 mmol/L   TCO2 24 22 - 32 mmol/L   Hemoglobin 14.3 12.0 - 15.0 g/dL   HCT 57.9 63.9 - 53.9 %      Assessment &  Plan:   Problem List Items Addressed This Visit       Cardiovascular and Mediastinum   Hypertension - Primary   Under good control on current regimen. Continue current regimen. Continue to monitor. Call with any concerns. Refills given. Labs drawn today.        Relevant Medications   amLODipine  (NORVASC ) 5 MG tablet   benazepril  (LOTENSIN ) 20 MG tablet   hydrochlorothiazide  (HYDRODIURIL ) 25 MG tablet   metoprolol  succinate (TOPROL -XL) 50 MG 24 hr tablet   Other Relevant Orders   Comprehensive metabolic panel with GFR   Microalbumin, Urine Waived   Aortic atherosclerosis (HCC)   Will keep BP and cholesterol under good control. Continue to monitor.      Relevant Medications   amLODipine  (NORVASC ) 5 MG tablet   benazepril  (LOTENSIN ) 20 MG tablet   hydrochlorothiazide  (HYDRODIURIL ) 25 MG tablet   metoprolol  succinate (TOPROL -XL) 50 MG 24 hr tablet   Other Relevant Orders   Comprehensive metabolic panel with GFR     Other   Hypercholesteremia   Rechecking labs today. Await results. Treat as needed.      Relevant Medications   amLODipine  (NORVASC ) 5 MG tablet   benazepril  (LOTENSIN ) 20 MG tablet   hydrochlorothiazide  (HYDRODIURIL ) 25 MG tablet   metoprolol  succinate (TOPROL -XL) 50 MG 24 hr tablet   Other Relevant Orders   Comprehensive metabolic panel with GFR   Lipid Panel w/o Chol/HDL Ratio   Iron deficiency anemia secondary to blood loss (chronic)   Rechecking labs today. Await results. Treat as needed.      Relevant Orders   Comprehensive metabolic panel with GFR   CBC with Differential/Platelet   Ferritin   Iron Binding Cap (TIBC)([facility])   Other Visit Diagnoses       Cramps, muscle, general       Will check labs. Await results. Treat as needed.   Relevant Orders   Comprehensive metabolic panel with GFR   Magnesium    Phosphorus   VITAMIN D 25 Hydroxy (Vit-D Deficiency, Fractures)     Vitamin D deficiency       Will check labs. Await results.  Treat as needed.   Relevant Orders   VITAMIN D 25 Hydroxy (Vit-D Deficiency, Fractures)        Follow up plan: Return in about 6 months (around 07/04/2024).

## 2024-01-03 LAB — COMPREHENSIVE METABOLIC PANEL WITH GFR
ALT: 22 IU/L (ref 0–32)
AST: 30 IU/L (ref 0–40)
Albumin: 4.7 g/dL (ref 3.7–4.7)
Alkaline Phosphatase: 80 IU/L (ref 44–121)
BUN/Creatinine Ratio: 22 (ref 12–28)
BUN: 13 mg/dL (ref 8–27)
Bilirubin Total: 0.7 mg/dL (ref 0.0–1.2)
CO2: 23 mmol/L (ref 20–29)
Calcium: 9.7 mg/dL (ref 8.7–10.3)
Chloride: 86 mmol/L — ABNORMAL LOW (ref 96–106)
Creatinine, Ser: 0.59 mg/dL (ref 0.57–1.00)
Globulin, Total: 2.5 g/dL (ref 1.5–4.5)
Glucose: 97 mg/dL (ref 70–99)
Potassium: 3.7 mmol/L (ref 3.5–5.2)
Sodium: 129 mmol/L — ABNORMAL LOW (ref 134–144)
Total Protein: 7.2 g/dL (ref 6.0–8.5)
eGFR: 90 mL/min/1.73 (ref >59)

## 2024-01-03 LAB — CBC WITH DIFFERENTIAL/PLATELET
Basophils Absolute: 0.1 x10E3/uL (ref 0.0–0.2)
Basos: 1 %
EOS (ABSOLUTE): 0.1 x10E3/uL (ref 0.0–0.4)
Eos: 1 %
Hematocrit: 41.1 % (ref 34.0–46.6)
Hemoglobin: 14 g/dL (ref 11.1–15.9)
Immature Grans (Abs): 0 x10E3/uL (ref 0.0–0.1)
Immature Granulocytes: 0 %
Lymphocytes Absolute: 1.4 x10E3/uL (ref 0.7–3.1)
Lymphs: 19 %
MCH: 35.3 pg — ABNORMAL HIGH (ref 26.6–33.0)
MCHC: 34.1 g/dL (ref 31.5–35.7)
MCV: 104 fL — ABNORMAL HIGH (ref 79–97)
Monocytes Absolute: 0.8 x10E3/uL (ref 0.1–0.9)
Monocytes: 11 %
Neutrophils Absolute: 4.9 x10E3/uL (ref 1.4–7.0)
Neutrophils: 67 %
Platelets: 220 x10E3/uL (ref 150–450)
RBC: 3.97 x10E6/uL (ref 3.77–5.28)
RDW: 12.1 % (ref 11.7–15.4)
WBC: 7.4 x10E3/uL (ref 3.4–10.8)

## 2024-01-03 LAB — VITAMIN D 25 HYDROXY (VIT D DEFICIENCY, FRACTURES): Vit D, 25-Hydroxy: 51.6 ng/mL (ref 30.0–100.0)

## 2024-01-03 LAB — LIPID PANEL W/O CHOL/HDL RATIO
Cholesterol, Total: 206 mg/dL — ABNORMAL HIGH (ref 100–199)
HDL: 147 mg/dL (ref >39)
LDL Chol Calc (NIH): 49 mg/dL (ref 0–99)
Triglycerides: 54 mg/dL (ref 0–149)
VLDL Cholesterol Cal: 10 mg/dL (ref 5–40)

## 2024-01-03 LAB — IRON AND TIBC
Iron Saturation: 23 % (ref 15–55)
Iron: 75 ug/dL (ref 27–139)
Total Iron Binding Capacity: 320 ug/dL (ref 250–450)
UIBC: 245 ug/dL (ref 118–369)

## 2024-01-03 LAB — MAGNESIUM: Magnesium: 1.5 mg/dL — ABNORMAL LOW (ref 1.6–2.3)

## 2024-01-03 LAB — PHOSPHORUS: Phosphorus: 3.4 mg/dL (ref 3.0–4.3)

## 2024-01-03 LAB — FERRITIN: Ferritin: 213 ng/mL — ABNORMAL HIGH (ref 15–150)

## 2024-01-06 ENCOUNTER — Ambulatory Visit: Payer: Self-pay | Admitting: Family Medicine

## 2024-03-10 DIAGNOSIS — E0590 Thyrotoxicosis, unspecified without thyrotoxic crisis or storm: Secondary | ICD-10-CM | POA: Diagnosis not present

## 2024-03-11 DIAGNOSIS — I442 Atrioventricular block, complete: Secondary | ICD-10-CM | POA: Diagnosis not present

## 2024-03-11 DIAGNOSIS — Z950 Presence of cardiac pacemaker: Secondary | ICD-10-CM | POA: Diagnosis not present

## 2024-03-16 DIAGNOSIS — E0590 Thyrotoxicosis, unspecified without thyrotoxic crisis or storm: Secondary | ICD-10-CM | POA: Diagnosis not present

## 2024-03-16 DIAGNOSIS — Z1331 Encounter for screening for depression: Secondary | ICD-10-CM | POA: Diagnosis not present

## 2024-03-24 ENCOUNTER — Other Ambulatory Visit (HOSPITAL_BASED_OUTPATIENT_CLINIC_OR_DEPARTMENT_OTHER): Payer: Self-pay

## 2024-03-30 ENCOUNTER — Other Ambulatory Visit: Payer: Self-pay | Admitting: Family Medicine

## 2024-03-31 NOTE — Telephone Encounter (Signed)
 Requested Prescriptions  Refused Prescriptions Disp Refills   benazepril  (LOTENSIN ) 20 MG tablet [Pharmacy Med Name: BENAZEPRIL  HCL 20 MG TAB] 90 tablet 1    Sig: TAKE 1 TABLET BY MOUTH ONCE EVERY MORNING     Cardiovascular:  ACE Inhibitors Passed - 03/31/2024  4:22 PM      Passed - Cr in normal range and within 180 days    Creatinine  Date Value Ref Range Status  08/15/2012 0.65 0.60 - 1.30 mg/dL Final   Creatinine, Ser  Date Value Ref Range Status  01/02/2024 0.59 0.57 - 1.00 mg/dL Final         Passed - K in normal range and within 180 days    Potassium  Date Value Ref Range Status  01/02/2024 3.7 3.5 - 5.2 mmol/L Final  08/15/2012 4.3 3.5 - 5.1 mmol/L Final         Passed - Patient is not pregnant      Passed - Last BP in normal range    BP Readings from Last 1 Encounters:  01/02/24 126/78         Passed - Valid encounter within last 6 months    Recent Outpatient Visits           2 months ago Hypertension, unspecified type   [facility] [place], Megan P, DO

## 2024-05-07 ENCOUNTER — Other Ambulatory Visit: Payer: Self-pay | Admitting: Family Medicine

## 2024-05-07 DIAGNOSIS — I10 Essential (primary) hypertension: Secondary | ICD-10-CM

## 2024-05-08 NOTE — Telephone Encounter (Signed)
 Requested Prescriptions  Refused Prescriptions Disp Refills   amLODipine  (NORVASC ) 5 MG tablet [Pharmacy Med Name: AMLODIPINE  BESYLATE 5 MG TAB] 90 tablet 1    Sig: TAKE 1 TABLET BY MOUTH ONCE DAILY     Cardiovascular: Calcium  Channel Blockers 2 Passed - 05/08/2024  5:39 PM      Passed - Last BP in normal range    BP Readings from Last 1 Encounters:  01/02/24 126/78         Passed - Last Heart Rate in normal range    Pulse Readings from Last 1 Encounters:  01/02/24 98         Passed - Valid encounter within last 6 months    Recent Outpatient Visits           4 months ago Hypertension, unspecified type   [place] [facility] [place], [place], DO

## 2024-05-09 ENCOUNTER — Other Ambulatory Visit: Payer: Self-pay | Admitting: Family Medicine

## 2024-05-09 DIAGNOSIS — I10 Essential (primary) hypertension: Secondary | ICD-10-CM

## 2024-05-11 NOTE — Telephone Encounter (Signed)
 Too soon for refill.  Requested Prescriptions  Pending Prescriptions Disp Refills   hydrochlorothiazide  (HYDRODIURIL ) 25 MG tablet [Pharmacy Med Name: HYDROCHLOROTHIAZIDE  25 MG TAB] 90 tablet 1    Sig: TAKE 1 TABLET BY MOUTH ONCE DAILY     Cardiovascular: Diuretics - Thiazide Failed - 05/11/2024  3:35 PM      Failed - Na in normal range and within 180 days    Sodium  Date Value Ref Range Status  01/02/2024 129 (L) 134 - 144 mmol/L Final  08/15/2012 141 136 - 145 mmol/L Final         Passed - Cr in normal range and within 180 days    Creatinine  Date Value Ref Range Status  08/15/2012 0.65 0.60 - 1.30 mg/dL Final   Creatinine, Ser  Date Value Ref Range Status  01/02/2024 0.59 0.57 - 1.00 mg/dL Final         Passed - K in normal range and within 180 days    Potassium  Date Value Ref Range Status  01/02/2024 3.7 3.5 - 5.2 mmol/L Final  08/15/2012 4.3 3.5 - 5.1 mmol/L Final         Passed - Last BP in normal range    BP Readings from Last 1 Encounters:  01/02/24 126/78         Passed - Valid encounter within last 6 months    Recent Outpatient Visits           4 months ago Hypertension, unspecified type   [place] [facility] [place], Megan P, DO               metoprolol  succinate (TOPROL -XL) 50 MG 24 hr tablet [Pharmacy Med Name: METOPROLOL  SUCCINATE ER 50 MG TAB] 90 tablet 1    Sig: TAKE 1 TABLET BY MOUTH ONCE DAILY WITH OR IMMEDIATELY FOLLOWING A MEAL     Cardiovascular:  Beta Blockers Passed - 05/11/2024  3:35 PM      Passed - Last BP in normal range    BP Readings from Last 1 Encounters:  01/02/24 126/78         Passed - Last Heart Rate in normal range    Pulse Readings from Last 1 Encounters:  01/02/24 98         Passed - Valid encounter within last 6 months    Recent Outpatient Visits           4 months ago Hypertension, unspecified type   [place] [facility] Emma, Turner, DO

## 2024-06-29 ENCOUNTER — Encounter: Payer: Self-pay | Admitting: Oncology

## 2024-06-29 ENCOUNTER — Inpatient Hospital Stay: Payer: Medicare Other | Attending: Oncology

## 2024-06-29 ENCOUNTER — Ambulatory Visit
Admission: RE | Admit: 2024-06-29 | Discharge: 2024-06-29 | Disposition: A | Discharge status: Home / Self Care | Payer: Medicare Other | Source: Ambulatory Visit | Attending: Oncology | Admitting: Oncology

## 2024-06-29 ENCOUNTER — Other Ambulatory Visit: Payer: Self-pay | Admitting: Family Medicine

## 2024-06-29 DIAGNOSIS — M5030 Other cervical disc degeneration, unspecified cervical region: Secondary | ICD-10-CM | POA: Diagnosis not present

## 2024-06-29 DIAGNOSIS — I700 Atherosclerosis of aorta: Secondary | ICD-10-CM | POA: Diagnosis not present

## 2024-06-29 DIAGNOSIS — C182 Malignant neoplasm of ascending colon: Secondary | ICD-10-CM | POA: Insufficient documentation

## 2024-06-29 DIAGNOSIS — R011 Cardiac murmur, unspecified: Secondary | ICD-10-CM | POA: Diagnosis not present

## 2024-06-29 DIAGNOSIS — Z79899 Other long term (current) drug therapy: Secondary | ICD-10-CM | POA: Insufficient documentation

## 2024-06-29 DIAGNOSIS — Z85828 Personal history of other malignant neoplasm of skin: Secondary | ICD-10-CM | POA: Diagnosis not present

## 2024-06-29 DIAGNOSIS — Z803 Family history of malignant neoplasm of breast: Secondary | ICD-10-CM | POA: Diagnosis not present

## 2024-06-29 DIAGNOSIS — I119 Hypertensive heart disease without heart failure: Secondary | ICD-10-CM | POA: Diagnosis not present

## 2024-06-29 DIAGNOSIS — Z809 Family history of malignant neoplasm, unspecified: Secondary | ICD-10-CM | POA: Insufficient documentation

## 2024-06-29 DIAGNOSIS — M8580 Other specified disorders of bone density and structure, unspecified site: Secondary | ICD-10-CM | POA: Diagnosis not present

## 2024-06-29 DIAGNOSIS — Z923 Personal history of irradiation: Secondary | ICD-10-CM | POA: Insufficient documentation

## 2024-06-29 DIAGNOSIS — M1990 Unspecified osteoarthritis, unspecified site: Secondary | ICD-10-CM | POA: Insufficient documentation

## 2024-06-29 DIAGNOSIS — I2510 Atherosclerotic heart disease of native coronary artery without angina pectoris: Secondary | ICD-10-CM | POA: Diagnosis not present

## 2024-06-29 DIAGNOSIS — I350 Nonrheumatic aortic (valve) stenosis: Secondary | ICD-10-CM | POA: Insufficient documentation

## 2024-06-29 DIAGNOSIS — Z853 Personal history of malignant neoplasm of breast: Secondary | ICD-10-CM | POA: Diagnosis not present

## 2024-06-29 LAB — CBC WITH DIFFERENTIAL/PLATELET
Abs Immature Granulocytes: 0.08 K/uL — ABNORMAL HIGH (ref 0.00–0.07)
Basophils Absolute: 0.1 K/uL (ref 0.0–0.1)
Basophils Relative: 1 %
Eosinophils Absolute: 0.1 K/uL (ref 0.0–0.5)
Eosinophils Relative: 1 %
HCT: 39.9 % (ref 36.0–46.0)
Hemoglobin: 14.7 g/dL (ref 12.0–15.0)
Immature Granulocytes: 1 %
Lymphocytes Relative: 16 %
Lymphs Abs: 1.4 K/uL (ref 0.7–4.0)
MCH: 36.4 pg — ABNORMAL HIGH (ref 26.0–34.0)
MCHC: 36.8 g/dL — ABNORMAL HIGH (ref 30.0–36.0)
MCV: 98.8 fL (ref 80.0–100.0)
Monocytes Absolute: 0.9 K/uL (ref 0.1–1.0)
Monocytes Relative: 10 %
Neutro Abs: 6.2 K/uL (ref 1.7–7.7)
Neutrophils Relative %: 71 %
Platelets: 239 K/uL (ref 150–400)
RBC: 4.04 MIL/uL (ref 3.87–5.11)
RDW: 12 % (ref 11.5–15.5)
WBC: 8.7 K/uL (ref 4.0–10.5)
nRBC: 0 % (ref 0.0–0.2)

## 2024-06-29 MED ORDER — IOHEXOL 300 MG/ML  SOLN
85.0000 mL | Freq: Once | INTRAMUSCULAR | Status: AC | PRN
  Administered 2024-06-29: 85 mL via INTRAVENOUS

## 2024-06-29 MED ORDER — BARIUM SULFATE 2 % PO SUSP
450.0000 mL | ORAL | Status: AC
  Administered 2024-06-29 (×2): 450 mL via ORAL

## 2024-06-30 LAB — CEA: CEA: 2.4 ng/mL (ref 0.0–4.7)

## 2024-06-30 NOTE — Telephone Encounter (Signed)
 Does she have enough to get to her appointment next week?

## 2024-07-03 ENCOUNTER — Other Ambulatory Visit: Payer: Self-pay

## 2024-07-03 DIAGNOSIS — C182 Malignant neoplasm of ascending colon: Secondary | ICD-10-CM

## 2024-07-03 DIAGNOSIS — Z1231 Encounter for screening mammogram for malignant neoplasm of breast: Secondary | ICD-10-CM

## 2024-07-06 ENCOUNTER — Encounter: Payer: Self-pay | Admitting: Oncology

## 2024-07-06 ENCOUNTER — Inpatient Hospital Stay: Payer: Medicare Other | Admitting: Oncology

## 2024-07-06 VITALS — BP 134/83 | HR 99 | Temp 98.8°F | Resp 20 | Wt 187.0 lb

## 2024-07-06 DIAGNOSIS — C182 Malignant neoplasm of ascending colon: Secondary | ICD-10-CM | POA: Diagnosis not present

## 2024-07-06 NOTE — Progress Notes (Signed)
 " [facility]  [phone] [phone]  ID: Arlean JULIANNA Gander OB: 12/26/1940  MR#: [id number]  RDW#:[id number]  Patient Care Team: Vicci Duwaine SQUIBB, DO as PCP - General (Family Medicine) Jacobo Evalene PARAS, MD as Consulting Physician (Oncology) Poggi, Norleen PARAS, MD as Consulting Physician (Orthopedic Surgery) Damian Therisa HERO, MD as Physician Assistant (Endocrinology) Tye Millet, DO as Consulting Physician (General Surgery)   CHIEF COMPLAINT: Stage IIIb right colon cancer.  INTERVAL HISTORY: Patient returns to clinic today for routine yearly evaluation and discussion of her imaging and laboratory results.  She continues to feel well and at her baseline. She has no neurologic complaints.  She denies any recent fevers or illnesses.  She has no chest pain, shortness of breath, cough, or hemoptysis. She denies any nausea, vomiting, constipation, or diarrhea.  She has no melena or hematochezia.  She has no urinary complaints.  Patient offers no specific complaints today.  REVIEW OF SYSTEMS:   Review of Systems  Constitutional: Negative.  Negative for fever, malaise/fatigue and weight loss.  Respiratory: Negative.  Negative for cough, hemoptysis and shortness of breath.   Cardiovascular: Negative.  Negative for chest pain and leg swelling.  Gastrointestinal: Negative.  Negative for abdominal pain, blood in stool, melena and nausea.  Genitourinary: Negative.  Negative for hematuria.  Musculoskeletal: Negative.  Negative for back pain.  Skin: Negative.  Negative for rash.  Neurological: Negative.  Negative for dizziness, focal weakness, weakness and headaches.  Psychiatric/Behavioral: Negative.  The patient is not nervous/anxious.     As per HPI. Otherwise, a complete review of systems is negative.   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   a.) stage IIIb; b.) RIGHT colon mass Bx (+) for  invasive moderately differentiated adenocarcinoma (pT3 pN1a)   Anemia    Aortic atherosclerosis    Aortic stenosis    a.) TTE 08/17/2016: EF >55%, mild AS (MPG 29.5); b.) TTE 08/25/2018: EF >55%; mod AS (MPG 42.3); c.) TTE 06/17/2020: EF >55%; severe AS (MPG 47.2); d.) s/p TAVR 08/31/2020 at [facility] - 23 mm Edwards Sapien 3 Ultra valve placed via a RIGHT percutaneous femoral approach --> complicated by intra/postoperative CHB requiring TVP and ultimate PPM placement   Arthritis    Basal cell carcinoma (BCC) of back    Bilateral carotid artery disease    Bilateral leg edema    Breast cancer, right (HCC) 2011   a.) stage Ia IMC (ER/PR +, HER2/neu -); b.) lumpectomy + XRT in 2011; c.) completed 5 years AI therapy (anastrozole) in 11/2014   Cardiomegaly    Cervical spinal stenosis    Cervical spondylosis    Chemotherapy induced nausea and vomiting    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   a.) TVP placed intraoperatively during TAVR secondary to development of CHB; b.) Medtronic PPM placed 09/03/2020 for continued postoperative CHB   Coronary artery disease    a.) R/LHC 08/11/2020: no sig CAD   DDD (degenerative disc disease), cervical    DDD (degenerative disc disease), lumbar    Diastolic dysfunction    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   Heart murmur    History of bilateral cataract extraction    Hyperplastic colon polyp    Hypertension    Hyperthyroidism    Long-term use of aspirin  therapy    Lumbar spondylosis    Neuropathy    Osteopenia    Personal history of other malignant neoplasm of large intestine 06/18/2020  PONV (postoperative nausea and vomiting)    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   a.) TVP placed intraoperatively during TAVR secondary to development of CHB; b.) Medtronic PPM placed 09/03/2020 for postoperative continued postoperative CHB   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   a.) s/p TAVR 08/31/2020 at [facility] - 23 mm Edwards  Sapien 3 Ultra valve placed via a RIGHT percutaneous femoral approach --> complicated by intra/postoperative CHB requiring TVP and ultimate PPM placement   Thyroid  nodule 01/25/2022   a.) CT C-spine 01/25/2022: 22 mm right thyroid  nodule   Umbilical hernia     PAST SURGICAL HISTORY: Past Surgical History:  Procedure Laterality Date   BACK SURGERY     lumber   BREAST EXCISIONAL BIOPSY Right 03/2009   BREAST EXCISIONAL BIOPSY Bilateral    NEG   CARPAL TUNNEL RELEASE Right 10/02/2023   Procedure: RELEASE, CARPAL TUNNEL, ENDOSCOPIC;  Surgeon: Edie Norleen PARAS, MD;  Location: [facility] OR[facility];  Service: Orthopedics;  Laterality: Right;   CATARACT EXTRACTION W/ INTRAOCULAR LENS  IMPLANT, BILATERAL     CHOLECYSTECTOMY     COLONOSCOPY WITH PROPOFOL  N/A 10/19/2019   Procedure: COLONOSCOPY WITH PROPOFOL -attempted, unable to perform poor prep;  Surgeon: Jinny Carmine, MD;  Location: [facility];  Service: Endoscopy;  Laterality: N/A;  priority 3   COLONOSCOPY WITH PROPOFOL  N/A 10/20/2019   Procedure: COLONOSCOPY WITH PROPOFOL ;  Surgeon: Jinny Carmine, MD;  Location: [facility] ENDOSCOPY;  Service: Endoscopy;  Laterality: N/A;   COLONOSCOPY WITH PROPOFOL  N/A 11/16/2021   Procedure: COLONOSCOPY WITH PROPOFOL ;  Surgeon: Jinny Carmine, MD;  Location: [facility];  Service: Endoscopy;  Laterality: N/A;   ESOPHAGEAL DILATION  10/19/2019   Procedure: ESOPHAGEAL DILATION;  Surgeon: Jinny Carmine, MD;  Location: [facility];  Service: Endoscopy;;   ESOPHAGOGASTRODUODENOSCOPY (EGD) WITH PROPOFOL  N/A 10/19/2019   Procedure: ESOPHAGOGASTRODUODENOSCOPY (EGD) WITH PROPOFOL ;  Surgeon: Jinny Carmine, MD;  Location: [facility];  Service: Endoscopy;  Laterality: N/A;   FLEXIBLE SIGMOIDOSCOPY N/A 03/20/2022   Procedure: FLEXIBLE SIGMOIDOSCOPY;  Surgeon: Jinny Carmine, MD;  Location: [facility] ENDOSCOPY;  Service: Endoscopy;  Laterality: N/A;   HEMORRHOID SURGERY     MASTECTOMY, PARTIAL Right 2010   PACEMAKER  INSERTION N/A 09/03/2020   Procedure: PACEMAKER WITH LEFT BUNDLE LEAD (MEDTRONIC); Location: [facility]; Surgeon: Clem Repress, MD   [facility] REMOVAL  2021   PORTACATH PLACEMENT Right 11/23/2019   Procedure: INSERTION PORT-A-CATH;  Surgeon: Lane Shope, MD;  Location: [facility] OR[facility];  Service: General;  Laterality: Right;   RECTAL EXAM UNDER ANESTHESIA N/A 07/18/2022   Procedure: RECTAL EXAM UNDER ANESTHESIA;  Surgeon: Lane Shope, MD;  Location: [facility] OR[facility];  Service: General;  Laterality: N/A;   REVERSE SHOULDER ARTHROPLASTY Right 02/12/2023   Procedure: REVERSE SHOULDER ARTHROPLASTY;  Surgeon: Edie Norleen PARAS, MD;  Location: [facility];  Service: Orthopedics;  Laterality: Right;   RIGHT COLECTOMY Right 10/2019   Procedure: ROBOTIC ASSISTED TIGHT HEMICOLECTOMY   RIGHT/LEFT HEART CATH AND CORONARY ANGIOGRAPHY Right 08/11/2020   Procedure: RIGHT/LEFT HEART CATH AND CORONARY ANGIOGRAPHY; Location: [facility]; Surgeon: Krystal Apgar, MD   TRANSCATHETER AORTIC VALVE REPLACEMENT, TRANSFEMORAL Right 08/31/2020   Procedure: TRANSCATHETER AORTIC VALVE REPLACEMENT, RIGHT TRANSFEMORAL; Location: [facility]; Surgeon: Reyes Fruits, MD   TUMOR EXCISION N/A 04/11/2022   Procedure: TUMOR EXCISION RECTAL;  Surgeon: Lane Shope, MD;  Location: [facility] OR[facility];  Service: General;  Laterality: N/A;    FAMILY HISTORY: Family History  Problem Relation Age of Onset   Cancer Mother    Heart disease Father  Breast cancer Paternal Aunt 10    ADVANCED DIRECTIVES (Y/N):  N  HEALTH MAINTENANCE: Social History   Tobacco Use   Smoking status: Never    Passive exposure: Never   Smokeless tobacco: Never  Vaping Use   Vaping status: Never Used  Substance Use Topics   Alcohol use: Yes    Alcohol/week: 14.0 standard drinks of alcohol    Types: 14 Shots of liquor per week    Comment: 2 cocktails daily   Drug use: No     Colonoscopy:  PAP:  Bone density:  Lipid panel:  Allergies  Allergen Reactions   Tape     (Skin  tears)Prefers Paper tape be used    Current Outpatient Medications  Medication Sig Dispense Refill   acetaminophen  (TYLENOL ) 500 MG tablet Take 500 mg by mouth every 6 (six) hours as needed for moderate pain (pain score 4-6).     amLODipine  (NORVASC ) 5 MG tablet Take 1 tablet (5 mg total) by mouth daily. 90 tablet 1   aspirin  EC 81 MG tablet Take 81 mg by mouth in the morning.     benazepril  (LOTENSIN ) 20 MG tablet TAKE 1 TABLET BY MOUTH ONCE EVERY MORNING 30 tablet 0   Cholecalciferol (D3 ADULT PO) Take 1 tablet by mouth in the morning. OTC unsure of dose     hydrochlorothiazide  (HYDRODIURIL ) 25 MG tablet Take 1 tablet (25 mg total) by mouth daily. 90 tablet 1   ibuprofen  (ADVIL ) 200 MG tablet Take 200 mg by mouth in the morning.     metoprolol  succinate (TOPROL -XL) 50 MG 24 hr tablet TAKE 1 TABLET BY MOUTH ONCE DAILY WITH OR IMMEDIATELY FOLLOWING A MEAL 90 tablet 1   Multiple Vitamin (MULTIVITAMIN WITH MINERALS) TABS tablet Take 1 tablet by mouth in the morning.     Probiotic Product (PROBIOTIC ADVANCED PO) Take 1 capsule by mouth in the morning.     No current facility-administered medications for this visit.    OBJECTIVE: Vitals:   07/06/24 1058  BP: 134/83  Pulse: 99  Resp: 20  Temp: 98.8 F (37.1 C)  SpO2: 100%     Body mass index is 33.13 kg/m.    ECOG FS:0 - Asymptomatic  General: Well-developed, well-nourished, no acute distress. Eyes: Pink conjunctiva, anicteric sclera. HEENT: Normocephalic, moist mucous membranes. Lungs: No audible wheezing or coughing. Heart: Regular rate and rhythm. Abdomen: Soft, nontender, no obvious distention. Musculoskeletal: No edema, cyanosis, or clubbing. Neuro: Alert, answering all questions appropriately. Cranial nerves grossly intact. Skin: No rashes or petechiae noted. Psych: Normal affect.  LAB RESULTS:  Lab Results  Component Value Date   NA 129 (L) 01/02/2024   K 3.7 01/02/2024   CL 86 (L) 01/02/2024   CO2 23 01/02/2024    GLUCOSE 97 01/02/2024   BUN 13 01/02/2024   CREATININE 0.59 01/02/2024   CALCIUM  9.7 01/02/2024   PROT 7.2 01/02/2024   ALBUMIN 4.7 01/02/2024   AST 30 01/02/2024   ALT 22 01/02/2024   ALKPHOS 80 01/02/2024   BILITOT 0.7 01/02/2024   GFRNONAA >60 09/25/2023   GFRAA >60 03/14/2020    Lab Results  Component Value Date   WBC 8.7 06/29/2024   NEUTROABS 6.2 06/29/2024   HGB 14.7 06/29/2024   HCT 39.9 06/29/2024   MCV 98.8 06/29/2024   PLT 239 06/29/2024     STUDIES: CT ABDOMEN PELVIS W CONTRAST Result Date: 07/03/2024 EXAM: CT ABDOMEN AND PELVIS WITH CONTRAST 06/29/2024 11:12:26 AM TECHNIQUE: CT of the abdomen  and pelvis was performed with the administration of 85 mL of iohexol  (OMNIPAQUE ) 300 MG/ML solution. Multiplanar reformatted images are provided for review. Automated exposure control, iterative reconstruction, and/or weight-based adjustment of the mA/kV was utilized to reduce the radiation dose to as low as reasonably achievable. COMPARISON: CT 06/26/2023. CLINICAL HISTORY: F/U Ascending Colon CA diagnosed in 10/2019. She had a partial colon resection in 10/2019 and she finished Chemo tx's in 04/2020. Pt states this is her Yearly CT to follow u.*comment was truncated* * Tracking Code: BO * FINDINGS: LOWER CHEST: No acute abnormality. LIVER: No liver metastasis. GALLBLADDER AND BILE DUCTS: Gallbladder is unremarkable. No biliary ductal dilatation. SPLEEN: No acute abnormality. PANCREAS: No acute abnormality. ADRENAL GLANDS: No acute abnormality. KIDNEYS, URETERS AND BLADDER: No stones in the kidneys or ureters. No hydronephrosis. No perinephric or periureteral stranding. Urinary bladder is unremarkable. GI AND BOWEL: Duodenum and small bowel are normal. Post right hemicolectomy anatomy. No evidence of recurrence at the anastomosis. The transverse colon and descending colon are normal. There are multiple diverticula of the left colon. Rectum is normal. Stomach demonstrates no acute  abnormality. There is no bowel obstruction. PERITONEUM AND RETROPERITONEUM: No ascites. No free air. VASCULATURE: Aorta is normal in caliber. LYMPH NODES: No mesenteric adenopathy. No retroperitoneal or periportal adenopathy. REPRODUCTIVE ORGANS: Uterus and adnexa are unremarkable. BONES AND SOFT TISSUES: Graph ostectomy. No focal soft tissue abnormality. IMPRESSION: 1. No evidence of local recurrence at the hepatic flexure anastomosis. 2. No evidence of metastatic disease. 3. No abdominal or pelvic lymphadenopathy. Electronically signed by: Norleen Boxer MD 07/03/2024 03:47 PM EST RP Workstation: [id number]    ASSESSMENT: Stage IIIb right colon cancer  PLAN:    Stage IIIb right colon cancer: Pathology results reviewed, confirming stage of disease.  Port has been removed.  She only received 11 treatments of FOLFOX secondary to declining performance status completing on April 25, 2020.  Patient underwent colonoscopy in June 2023 where 3 polyps were removed.  Repeat flexible sigmoidoscopy on March 20, 2022 revealed 1 rectal polyp.  Continue follow-up per GI recommendations.  Her most recent imaging with CT scan on June 29, 2024 reviewed independently and reported as above with no obvious evidence of recurrent or progressive disease.  CEA continues to be within normal limits.  No intervention is needed at this time.  Return to clinic in 1 year with repeat imaging and laboratory work at which time patient continues to have no evidence of disease, she likely can be discharged from clinic.   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  She completed 5 years of anastrozole in June 2016.  Patient missed her most recent mammogram secondary to shoulder replacement surgery.  These are ordered by her primary care physician.   Heart disease: Continue follow-up at [facility] as scheduled. History of Guillain-Barr: Unclear  etiology.  Patient reports possibly related to flu shot.  Patient's symptoms have resolved.  I spent a total of 20 minutes reviewing chart data, face-to-face evaluation with the patient, counseling and coordination of care as detailed above.   Patient expressed understanding and was in agreement with this plan. She also understands that She can call clinic at any time with any questions, concerns, or complaints.    Cancer Staging  Colon cancer [facility]) Staging form: Colon and Rectum, AJCC 8th Edition - Clinical stage from 11/13/2019: Stage IIIB (cT3, cN1a, cM0) - Signed by Jacobo Evalene PARAS, MD on 11/13/2019  Total positive nodes: 1   Evalene JINNY Reusing, MD   07/06/2024 12:33 PM     "

## 2024-07-06 NOTE — Progress Notes (Signed)
 Her ankles are swollen.

## 2024-07-07 ENCOUNTER — Encounter: Payer: Self-pay | Admitting: Oncology

## 2024-07-08 ENCOUNTER — Encounter: Payer: Self-pay | Admitting: Family Medicine

## 2024-07-08 ENCOUNTER — Ambulatory Visit: Admitting: Family Medicine

## 2024-07-08 VITALS — BP 130/82 | HR 81 | Temp 97.7°F | Ht 63.0 in | Wt 186.0 lb

## 2024-07-08 DIAGNOSIS — M810 Age-related osteoporosis without current pathological fracture: Secondary | ICD-10-CM

## 2024-07-08 DIAGNOSIS — I10 Essential (primary) hypertension: Secondary | ICD-10-CM

## 2024-07-08 DIAGNOSIS — E7800 Pure hypercholesterolemia, unspecified: Secondary | ICD-10-CM | POA: Diagnosis not present

## 2024-07-08 DIAGNOSIS — E0590 Thyrotoxicosis, unspecified without thyrotoxic crisis or storm: Secondary | ICD-10-CM | POA: Diagnosis not present

## 2024-07-08 DIAGNOSIS — Z1231 Encounter for screening mammogram for malignant neoplasm of breast: Secondary | ICD-10-CM | POA: Diagnosis not present

## 2024-07-08 LAB — MICROALBUMIN, URINE WAIVED
Creatinine, Urine Waived: 200 mg/dL (ref 10–300)
Microalb, Ur Waived: 80 mg/L — ABNORMAL HIGH (ref 0–19)

## 2024-07-08 MED ORDER — AMLODIPINE BESYLATE 5 MG PO TABS: 5.0000 mg | ORAL_TABLET | Freq: Every day | ORAL | 1 refills | Status: AC

## 2024-07-08 MED ORDER — HYDROCHLOROTHIAZIDE 25 MG PO TABS: 25.0000 mg | ORAL_TABLET | Freq: Every day | ORAL | 1 refills | Status: AC

## 2024-07-08 MED ORDER — BENAZEPRIL HCL 20 MG PO TABS: 20.0000 mg | ORAL_TABLET | Freq: Every day | ORAL | 1 refills | Status: AC

## 2024-07-08 MED ORDER — METOPROLOL SUCCINATE ER 50 MG PO TB24: ORAL_TABLET | ORAL | 1 refills | Status: AC

## 2024-07-08 NOTE — Assessment & Plan Note (Signed)
 Under good control on current regimen. Continue current regimen. Continue to monitor. Call with any concerns. Refills given. Labs drawn today.

## 2024-07-08 NOTE — Assessment & Plan Note (Signed)
Due for recheck on DEXA. Ordered today.  

## 2024-07-08 NOTE — Assessment & Plan Note (Signed)
 Stable. Following with endocrinology- due to see them in the next couple of weeks. Call with any concerns.

## 2024-07-08 NOTE — Progress Notes (Signed)
 "  BP 130/82   Pulse 81   Temp 97.7 F (36.5 C) (Oral)   Ht 5' 3 (1.6 m)   Wt 186 lb (84.4 kg)   SpO2 96%   BMI 32.95 kg/m    Subjective:    Patient ID: Maria Davies, female    DOB: 1941-03-26, 84 y.o.   MRN: [id number]  HPI: Maria Davies is a 84 y.o. female  Chief Complaint  Patient presents with   Hypertension   HYPERTENSION / HYPERLIPIDEMIA Satisfied with current treatment? yes Duration of hypertension: chronic BP monitoring frequency: not checking BP medication side effects: no Past BP meds: amlodipine , benazepril , hydrochlorothiazide , metoprolol  Duration of hyperlipidemia: chronic Cholesterol medication side effects: no Cholesterol supplements: none Past cholesterol medications: none Medication compliance: excellent compliance Aspirin : yes Recent stressors: no Recurrent headaches: no Visual changes: no Palpitations: no Dyspnea: no Chest pain: no Lower extremity edema: no Dizzy/lightheaded: no  Relevant past medical, surgical, family and social history reviewed and updated as indicated. Interim medical history since our last visit reviewed. Allergies and medications reviewed and updated.  Review of Systems  Constitutional: Negative.   Respiratory: Negative.    Cardiovascular: Negative.   Gastrointestinal: Negative.   Musculoskeletal: Negative.   Neurological: Negative.   Psychiatric/Behavioral: Negative.      Per HPI unless specifically indicated above     Objective:    BP 130/82   Pulse 81   Temp 97.7 F (36.5 C) (Oral)   Ht 5' 3 (1.6 m)   Wt 186 lb (84.4 kg)   SpO2 96%   BMI 32.95 kg/m   Wt Readings from Last 3 Encounters:  07/08/24 186 lb (84.4 kg)  07/06/24 187 lb (84.8 kg)  01/02/24 188 lb (85.3 kg)    Physical Exam Vitals and nursing note reviewed.  Constitutional:      General: She is not in acute distress.    Appearance: Normal appearance. She is not ill-appearing, toxic-appearing or diaphoretic.  HENT:     Head:  Normocephalic and atraumatic.     Right Ear: External ear normal.     Left Ear: External ear normal.     Nose: Nose normal.     Mouth/Throat:     Mouth: Mucous membranes are moist.     Pharynx: Oropharynx is clear.  Eyes:     General: No scleral icterus.       Right eye: No discharge.        Left eye: No discharge.     Extraocular Movements: Extraocular movements intact.     Conjunctiva/sclera: Conjunctivae normal.     Pupils: Pupils are equal, round, and reactive to light.  Cardiovascular:     Rate and Rhythm: Normal rate and regular rhythm.     Pulses: Normal pulses.     Heart sounds: Normal heart sounds. No murmur heard.    No friction rub. No gallop.  Pulmonary:     Effort: Pulmonary effort is normal. No respiratory distress.     Breath sounds: Normal breath sounds. No stridor. No wheezing, rhonchi or rales.  Chest:     Chest wall: No tenderness.  Musculoskeletal:        General: Normal range of motion.     Cervical back: Normal range of motion and neck supple.  Skin:    General: Skin is warm and dry.     Capillary Refill: Capillary refill takes less than 2 seconds.     Coloration: Skin is not jaundiced or pale.  Findings: No bruising, erythema, lesion or rash.  Neurological:     General: No focal deficit present.     Mental Status: She is alert and oriented to person, place, and time. Mental status is at baseline.  Psychiatric:        Mood and Affect: Mood normal.        Behavior: Behavior normal.        Thought Content: Thought content normal.        Judgment: Judgment normal.     Results for orders placed or performed in visit on 06/29/24  CEA   Collection Time: 06/29/24  8:23 AM  Result Value Ref Range   CEA 2.4 0.0 - 4.7 ng/mL  CBC with Differential/Platelet   Collection Time: 06/29/24  8:23 AM  Result Value Ref Range   WBC 8.7 4.0 - 10.5 K/uL   RBC 4.04 3.87 - 5.11 MIL/uL   Hemoglobin 14.7 12.0 - 15.0 g/dL   HCT 60.0 63.9 - 53.9 %   MCV 98.8 80.0 -  100.0 fL   MCH 36.4 (H) 26.0 - 34.0 pg   MCHC 36.8 (H) 30.0 - 36.0 g/dL   RDW 87.9 88.4 - 84.4 %   Platelets 239 150 - 400 K/uL   nRBC 0.0 0.0 - 0.2 %   Neutrophils Relative % 71 %   Neutro Abs 6.2 1.7 - 7.7 K/uL   Lymphocytes Relative 16 %   Lymphs Abs 1.4 0.7 - 4.0 K/uL   Monocytes Relative 10 %   Monocytes Absolute 0.9 0.1 - 1.0 K/uL   Eosinophils Relative 1 %   Eosinophils Absolute 0.1 0.0 - 0.5 K/uL   Basophils Relative 1 %   Basophils Absolute 0.1 0.0 - 0.1 K/uL   Immature Granulocytes 1 %   Abs Immature Granulocytes 0.08 (H) 0.00 - 0.07 K/uL      Assessment & Plan:   Problem List Items Addressed This Visit       Cardiovascular and Mediastinum   Hypertension - Primary   Under good control on current regimen. Continue current regimen. Continue to monitor. Call with any concerns. Refills given. Labs drawn today.        Relevant Medications   amLODipine  (NORVASC ) 5 MG tablet   benazepril  (LOTENSIN ) 20 MG tablet   hydrochlorothiazide  (HYDRODIURIL ) 25 MG tablet   metoprolol  succinate (TOPROL -XL) 50 MG 24 hr tablet   Other Relevant Orders   Comprehensive metabolic panel with GFR   Microalbumin, Urine Waived     Endocrine   Hyperthyroidism   Stable. Following with endocrinology- due to see them in the next couple of weeks. Call with any concerns.       Relevant Medications   methimazole  (TAPAZOLE ) 5 MG tablet   metoprolol  succinate (TOPROL -XL) 50 MG 24 hr tablet     Musculoskeletal and Integument   Osteoporosis   Due for recheck on DEXA. Ordered today.       Relevant Orders   DG Bone Density     Other   Hypercholesteremia   Unable to tolerate statins. Continue to monitor. Call with any concerns. Labs drawn today.       Relevant Medications   amLODipine  (NORVASC ) 5 MG tablet   benazepril  (LOTENSIN ) 20 MG tablet   hydrochlorothiazide  (HYDRODIURIL ) 25 MG tablet   metoprolol  succinate (TOPROL -XL) 50 MG 24 hr tablet   Other Relevant Orders   Lipid Panel  w/o Chol/HDL Ratio   Comprehensive metabolic panel with GFR   Other Visit Diagnoses  Encounter for screening mammogram for malignant neoplasm of breast       Mammogram ordered today.   Relevant Orders   MM 3D SCREENING MAMMOGRAM BILATERAL BREAST        Follow up plan: Return in about 6 months (around 01/05/2025).      "

## 2024-07-08 NOTE — Patient Instructions (Signed)
 Please call to schedule your mammogram and/or bone density: [facility] at [facility]  Address: [address], [place], [place] [address] Phone: [phone]  [facility] [address] [place],  [place]  [address] Phone: [phone]

## 2024-07-08 NOTE — Assessment & Plan Note (Signed)
Unable to tolerate statins. Continue to monitor. Call with any concerns. Labs drawn today.

## 2024-07-09 LAB — COMPREHENSIVE METABOLIC PANEL WITH GFR
ALT: 15 IU/L (ref 0–32)
AST: 28 IU/L (ref 0–40)
Albumin: 4.6 g/dL (ref 3.7–4.7)
Alkaline Phosphatase: 79 IU/L (ref 48–129)
BUN/Creatinine Ratio: 21 (ref 12–28)
BUN: 13 mg/dL (ref 8–27)
Bilirubin Total: 1.1 mg/dL (ref 0.0–1.2)
CO2: 26 mmol/L (ref 20–29)
Calcium: 9.9 mg/dL (ref 8.7–10.3)
Chloride: 89 mmol/L — ABNORMAL LOW (ref 96–106)
Creatinine, Ser: 0.62 mg/dL (ref 0.57–1.00)
Globulin, Total: 2.6 g/dL (ref 1.5–4.5)
Glucose: 108 mg/dL — ABNORMAL HIGH (ref 70–99)
Potassium: 3.3 mmol/L — ABNORMAL LOW (ref 3.5–5.2)
Sodium: 135 mmol/L (ref 134–144)
Total Protein: 7.2 g/dL (ref 6.0–8.5)
eGFR: 89 mL/min/1.73

## 2024-07-09 LAB — LIPID PANEL W/O CHOL/HDL RATIO
Cholesterol, Total: 230 mg/dL — ABNORMAL HIGH (ref 100–199)
HDL: 134 mg/dL
LDL Chol Calc (NIH): 88 mg/dL (ref 0–99)
Triglycerides: 44 mg/dL (ref 0–149)
VLDL Cholesterol Cal: 8 mg/dL (ref 5–40)

## 2024-07-11 ENCOUNTER — Ambulatory Visit: Payer: Self-pay | Admitting: Family Medicine

## 2024-07-15 NOTE — Progress Notes (Signed)
 Lab results printed and mailed.

## 2024-08-06 ENCOUNTER — Encounter

## 2024-08-06 ENCOUNTER — Other Ambulatory Visit

## 2024-10-08 ENCOUNTER — Ambulatory Visit

## 2025-01-06 ENCOUNTER — Ambulatory Visit: Admitting: Family Medicine

## 2025-06-29 ENCOUNTER — Ambulatory Visit

## 2025-07-07 ENCOUNTER — Inpatient Hospital Stay: Admitting: Oncology
